# Patient Record
Sex: Male | Born: 1935 | Race: White | Hispanic: No | Marital: Married | Smoking: Never smoker
Health system: Southern US, Community
[De-identification: ages and names within clinical notes are randomized; demographics above are authoritative.]

## PROBLEM LIST (undated history)

## (undated) DIAGNOSIS — I82409 Acute embolism and thrombosis of unspecified deep veins of unspecified lower extremity: Secondary | ICD-10-CM

## (undated) DIAGNOSIS — C801 Malignant (primary) neoplasm, unspecified: Secondary | ICD-10-CM

## (undated) DIAGNOSIS — K469 Unspecified abdominal hernia without obstruction or gangrene: Secondary | ICD-10-CM

## (undated) DIAGNOSIS — I10 Essential (primary) hypertension: Secondary | ICD-10-CM

## (undated) DIAGNOSIS — E785 Hyperlipidemia, unspecified: Secondary | ICD-10-CM

## (undated) DIAGNOSIS — N400 Enlarged prostate without lower urinary tract symptoms: Secondary | ICD-10-CM

## (undated) DIAGNOSIS — I2510 Atherosclerotic heart disease of native coronary artery without angina pectoris: Secondary | ICD-10-CM

## (undated) HISTORY — DX: Essential (primary) hypertension: I10

## (undated) HISTORY — DX: Acute embolism and thrombosis of unspecified deep veins of unspecified lower extremity: I82.409

## (undated) HISTORY — DX: Benign prostatic hyperplasia without lower urinary tract symptoms: N40.0

## (undated) HISTORY — DX: Hyperlipidemia, unspecified: E78.5

## (undated) HISTORY — DX: Atherosclerotic heart disease of native coronary artery without angina pectoris: I25.10

## (undated) HISTORY — DX: Unspecified abdominal hernia without obstruction or gangrene: K46.9

## (undated) HISTORY — PX: BACK SURGERY: SHX140

---

## 2008-11-28 ENCOUNTER — Encounter: Payer: Self-pay | Admitting: Cardiology

## 2009-12-18 ENCOUNTER — Encounter: Payer: Self-pay | Admitting: Cardiology

## 2009-12-26 ENCOUNTER — Encounter: Payer: Self-pay | Admitting: Cardiology

## 2010-01-08 ENCOUNTER — Ambulatory Visit: Payer: Self-pay | Admitting: Cardiology

## 2010-01-08 ENCOUNTER — Encounter (INDEPENDENT_AMBULATORY_CARE_PROVIDER_SITE_OTHER): Payer: Self-pay | Admitting: *Deleted

## 2010-01-08 DIAGNOSIS — I10 Essential (primary) hypertension: Secondary | ICD-10-CM | POA: Insufficient documentation

## 2010-01-08 DIAGNOSIS — R9439 Abnormal result of other cardiovascular function study: Secondary | ICD-10-CM | POA: Insufficient documentation

## 2010-01-08 LAB — CONVERTED CEMR LAB
BUN: 18 mg/dL (ref 6–23)
Basophils Absolute: 0 10*3/uL (ref 0.0–0.1)
Basophils Relative: 0.4 % (ref 0.0–3.0)
CO2: 29 meq/L (ref 19–32)
Calcium: 9.4 mg/dL (ref 8.4–10.5)
Chloride: 100 meq/L (ref 96–112)
Creatinine, Ser: 0.8 mg/dL (ref 0.4–1.5)
Eosinophils Absolute: 0.1 10*3/uL (ref 0.0–0.7)
Eosinophils Relative: 1.7 % (ref 0.0–5.0)
GFR calc non Af Amer: 97.83 mL/min (ref >60)
Glucose, Bld: 84 mg/dL (ref 70–99)
HCT: 40.4 % (ref 39.0–52.0)
Hemoglobin: 13.6 g/dL (ref 13.0–17.0)
INR: 1 (ref 0.8–1.0)
Lymphocytes Relative: 26.1 % (ref 12.0–46.0)
Lymphs Abs: 2 10*3/uL (ref 0.7–4.0)
MCHC: 33.6 g/dL (ref 30.0–36.0)
MCV: 88.3 fL (ref 78.0–100.0)
Monocytes Absolute: 0.7 10*3/uL (ref 0.1–1.0)
Monocytes Relative: 9.6 % (ref 3.0–12.0)
Neutro Abs: 4.7 10*3/uL (ref 1.4–7.7)
Neutrophils Relative %: 62.2 % (ref 43.0–77.0)
Platelets: 224 10*3/uL (ref 150.0–400.0)
Potassium: 4 meq/L (ref 3.5–5.1)
Prothrombin Time: 10.7 s (ref 9.7–11.8)
RBC: 4.57 M/uL (ref 4.22–5.81)
RDW: 13.7 % (ref 11.5–14.6)
Sodium: 138 meq/L (ref 135–145)
WBC: 7.6 10*3/uL (ref 4.5–10.5)
aPTT: 24.8 s (ref 21.7–28.8)

## 2010-01-09 ENCOUNTER — Ambulatory Visit (HOSPITAL_COMMUNITY): Admission: RE | Admit: 2010-01-09 | Discharge: 2010-01-10 | Payer: Self-pay | Admitting: Cardiology

## 2010-01-09 ENCOUNTER — Ambulatory Visit
Admission: RE | Admit: 2010-01-09 | Discharge: 2010-01-09 | Payer: Self-pay | Source: Home / Self Care | Admitting: Cardiology

## 2010-01-09 ENCOUNTER — Ambulatory Visit: Payer: Self-pay | Admitting: Cardiology

## 2010-01-15 ENCOUNTER — Encounter: Payer: Self-pay | Admitting: Cardiology

## 2010-01-16 DIAGNOSIS — I2510 Atherosclerotic heart disease of native coronary artery without angina pectoris: Secondary | ICD-10-CM

## 2010-01-16 HISTORY — DX: Atherosclerotic heart disease of native coronary artery without angina pectoris: I25.10

## 2010-01-29 ENCOUNTER — Ambulatory Visit: Payer: Self-pay | Admitting: Cardiology

## 2010-01-29 DIAGNOSIS — IMO0001 Reserved for inherently not codable concepts without codable children: Secondary | ICD-10-CM | POA: Insufficient documentation

## 2010-01-29 DIAGNOSIS — I2510 Atherosclerotic heart disease of native coronary artery without angina pectoris: Secondary | ICD-10-CM | POA: Insufficient documentation

## 2010-01-31 LAB — CONVERTED CEMR LAB
CK-MB: 2.7 ng/mL (ref 0.3–4.0)
Total CK: 68 units/L (ref 7–232)

## 2010-02-19 ENCOUNTER — Ambulatory Visit: Payer: Self-pay | Admitting: Cardiology

## 2010-02-19 ENCOUNTER — Ambulatory Visit: Payer: Self-pay

## 2010-03-09 ENCOUNTER — Telehealth: Payer: Self-pay | Admitting: Cardiology

## 2010-03-14 ENCOUNTER — Telehealth (INDEPENDENT_AMBULATORY_CARE_PROVIDER_SITE_OTHER): Payer: Self-pay | Admitting: *Deleted

## 2010-03-29 ENCOUNTER — Telehealth: Payer: Self-pay | Admitting: Cardiology

## 2010-04-02 ENCOUNTER — Telehealth: Payer: Self-pay | Admitting: Cardiovascular Disease

## 2010-04-05 ENCOUNTER — Ambulatory Visit
Admission: RE | Admit: 2010-04-05 | Discharge: 2010-04-05 | Payer: Self-pay | Source: Home / Self Care | Attending: Cardiovascular Disease | Admitting: Cardiovascular Disease

## 2010-04-17 NOTE — Assessment & Plan Note (Signed)
Summary: rov/per dr Juanda Chance   Primary Provider:  Barron Alvine, MD [facility]   History of Present Illness: Harold [name] is 76 years old and came today for an unscheduled visit because of chest pain. I saw him recently in consultation for exertional chest pain. He underwent catheterization and was found to have a tight lesion in a large posterolateral branch of the right coronary artery and a tight lesion approximately LAD and complete occlusion of the distal LAD. We fixed the lesions in the posterior lateral branch of the right coronary and the mid LAD with drug-eluting stents.  Over the last few days he has had some sharp shooting chest pains. He had recurrence this morning and called and we asked him to come in. He also has been having problems with "gas". He also has had some muscle weakness and wondered if this was related to his simvastatin.  He has had none in the exertional chest tightness that he had prior to his PCI procedure.  Current Medications (verified): 1)  Isosorbide Dinitrate 5 Mg Tabs (Isosorbide Dinitrate) .... Take One Tablet Two Times A Day 2)  Lisinopril 10 Mg Tabs (Lisinopril) .... Take One Tablet By Mouth Daily 3)  Atenolol 25 Mg Tabs (Atenolol) .... Take One Tablet By Mouth Daily 4)  Hydrochlorothiazide 25 Mg Tabs (Hydrochlorothiazide) .... Take One Tablet By Mouth Daily. 5)  Finasteride 5 Mg Tabs (Finasteride) .... Please Take 1 Tablet By Mouth Daily. 6)  Vitamin D 1000 Unit Tabs (Cholecalciferol) .... Please Take One Tablet By Mouth Daily. 7)  Timoptic 0.5 % Soln (Timolol Maleate) .... One Drop Daily in The Right Eye. 8)  Brimonidine Tartrate 0.2 % Soln (Brimonidine Tartrate) .... One Drop in Right Eye Two Times A Day. 9)  Aspirin 81 Mg Tabs (Aspirin) .... Once Daily 10)  Effient 10 Mg Tabs (Prasugrel Hcl) .... Once Daily 11)  Simvastatin 40 Mg Tabs (Simvastatin) .... Once Daily  Allergies (verified): No Known Drug Allergies  Past History:  Past  Medical History: Reviewed history from 01/05/2010 and no changes required. hypertension chest pain BPH elevated PSA  Review of Systems       ROS is negative except as outlined in HPI.   Vital Signs:  Patient profile:   76 year old male Height:      70 inches Weight:      195 pounds BMI:     28.08 Pulse rate:   83 / minute Pulse rhythm:   regular BP sitting:   111 / 65  (left arm) Cuff size:   regular  Vitals Entered By: Judithe Modest CMA (January 29, 2010 12:23 PM)  Physical Exam  Additional Exam:  Gen. Well-nourished, in no distress   Neck: No JVD, thyroid not enlarged, no carotid bruits Lungs: No tachypnea, clear without rales, rhonchi or wheezes Cardiovascular: Rhythm regular, PMI not displaced,  heart sounds  normal, no murmurs or gallops, no peripheral edema, pulses normal in all 4 extremities. Abdomen: BS normal, abdomen soft and non-tender without masses or organomegaly, no hepatosplenomegaly. MS: No deformities, no cyanosis or clubbing   Neuro:  No focal sns   Skin:  no lesions    Impression & Recommendations:  Problem # 1:  CAD, NATIVE VESSEL (ICD-414.01) He had recent two-vessel PCI with drug stents for stable angina. His platelet inhibition was low so we switched him from Plavix to prasugrel.  He has had recent chest pain which sounds noncardiac. It possibly could be related to gas. We will  give him a trial of Phazyme for gas. We will have him return in 2 weeks for a stress ECG. His updated medication list for this problem includes:    Isosorbide Dinitrate 5 Mg Tabs (Isosorbide dinitrate) .[name]... Take one tablet two times a day    Lisinopril 10 Mg Tabs (Lisinopril) .[name]... Take one tablet by mouth daily    Atenolol 25 Mg Tabs (Atenolol) .[name]... Take one tablet by mouth daily    Aspirin 81 Mg Tabs (Aspirin) ..... Once daily    Effient 10 Mg Tabs (Prasugrel hcl) ..... Once daily  Problem # 2:  MUSCLE PAIN (ICD-729.1) He is having myalgias and weakness of his lower  extremities and wonders if this is related to simvastatin. We will hold the simvastatin. Orders: TLB-CK Total Only(Creatine Kinase/CPK) (82550-CK) TLB-CK-MB (Creatine Kinase MB) (82553-CKMB)  Problem # 3:  HYPERTENSION, BENIGN ESSENTIAL (ICD-401.1) This is well controlled on current medications. His updated medication list for this problem includes:    Lisinopril 10 Mg Tabs (Lisinopril) .[name]... Take one tablet by mouth daily    Atenolol 25 Mg Tabs (Atenolol) .[name]... Take one tablet by mouth daily    Hydrochlorothiazide 25 Mg Tabs (Hydrochlorothiazide) .[name]... Take one tablet by mouth daily.    Aspirin 81 Mg Tabs (Aspirin) ..... Once daily  Other Orders: Treadmill (Treadmill)  Patient Instructions: 1)  Your physician recommends that you schedule a follow-up appointment in: 2 WEEKS TREADMILL 2)  Your physician has recommended you make the following change in your medication: STOP SIMVASTATIN 3)  START PHAZYME OTC FOR GAS Prescriptions: ISOSORBIDE DINITRATE 5 MG TABS (ISOSORBIDE DINITRATE) take one tablet two times a day  #180 x 4   Entered by:   Deliah Goody, RN   Authorized by:   Lenoria Farrier, MD, [facility]   Signed by:   Deliah Goody, RN on 01/29/2010   Method used:   Electronically to        [facility]* (mail-order)       [address] [place]  [address]       Ph: [phone]       Fax: [phone]   RxID:   [id number]

## 2010-04-17 NOTE — Letter (Signed)
Summary: [facility]   Imported By: Marylou Mccoy 02/06/2010 15:14:53  _____________________________________________________________________  External Attachment:    Type:   Image     Comment:   External Document

## 2010-04-17 NOTE — Letter (Signed)
Summary: Cardiac Catheterization Instructions- Main Lab  [facility] [employer], Main Office  [address]   [place], [place] [address]   Phone: [phone]  Fax: [phone]     01/08/2010 MRN: [id number]  SUSANO CLECKLER [address] [place], [place]  [address]  Dear Mr. Fenn,   You are scheduled for Cardiac Catheterization on Tuesday 01/09/10              with Dr. Juanda Chance.  Please arrive at the [facility] at 5:30      a.m. on the day of your procedure.  1. DIET     __x__ Nothing to eat or drink after midnight except your medications with a sip of water.  2. Come to the [place] office on  (today)    for lab work.  The lab at [facility] is open from 8:30 a.m. to 1:30 p.m. and 2:30 p.m. to 5:00 p.m.  The lab at [facility] is open from 7:30 a.m. to 5:30 p.m.  You do not have to be fasting.  3. MAKE SURE YOU TAKE YOUR ASPIRIN.  4. _____ DO NOT TAKE these medications before your procedure:         ________________________________________________________________________________      _x___ YOU MAY TAKE ALL of your remaining medications with a small amount of water.      ____ START NEW medications:     ________________________________________________________________________________      ____ Eilene Ghazi instructions:     ________________________________________________________________________________  5. Plan for one night stay - bring personal belongings (i.e. toothpaste, toothbrush, etc.)  6. Bring a current list of your medications and current insurance cards.  7. Must have a responsible person to drive you home.   8. Someone must be with you for the first 24 hours after you arrive home.  9. Please wear clothes that are easy to get on and off and wear slip-on shoes.  *Special note: Every effort is made to have your procedure done on time.  Occasionally there are emergencies that present themselves at the  hospital that may cause delays.  Please be patient if a delay does occur.  If you have any questions after you get home, please call the office at the number listed above.  Sherri Rad, RN, BSN

## 2010-04-17 NOTE — Assessment & Plan Note (Signed)
Summary: NP6   Primary Provider:  Barron Alvine, MD [facility]  CC:  chest pain.  History of Present Illness: 76 year old male with PMH significant for HTN preseted to his PCP with c/o chest tightness, a stress test was done which showed some inferior/apical defect which was reversible with rest. today patient denies any CP or SOB, and said that the last time he has chest tighness was last saturday when he was moving some plants with his wife, the pain lasted for 5 minutes and subsided with rest. The pain is usually exertional and subsides with rest, patient has been having these symptoms for the past several months.    Patient is feeling well and denies CP, abdominal pain, nausea, vomiting, HA's, palpitations, blurred vision. fever, chills, diarrhea, constipation or SOB.   Current Medications (verified): 1)  Isosorbide Dinitrate 10 Mg Tabs (Isosorbide Dinitrate) .... Take 1/2 Tablet By Mouth Two Times A Day. 2)  Lisinopril 10 Mg Tabs (Lisinopril) .... Take One Tablet By Mouth Daily 3)  Atenolol 25 Mg Tabs (Atenolol) .... Take One Tablet By Mouth Daily 4)  Hydrochlorothiazide 25 Mg Tabs (Hydrochlorothiazide) .... Take One Tablet By Mouth Daily. 5)  Finasteride 5 Mg Tabs (Finasteride) .... Please Take 1 Tablet By Mouth Daily. 6)  Vitamin D 1000 Unit Tabs (Cholecalciferol) .... Please Take One Tablet By Mouth Daily. 7)  Timoptic 0.5 % Soln (Timolol Maleate) .... One Drop Daily in The Right Eye. 8)  Brimonidine Tartrate 0.2 % Soln (Brimonidine Tartrate) .... One Drop in Right Eye Two Times A Day.  Allergies (verified): No Known Drug Allergies  Past History:  Past Medical History: Last updated: 01/05/2010 hypertension chest pain BPH elevated PSA  Review of Systems       Negative except per HPI  Vital Signs:  Patient profile:   76 year old male Height:      70 inches Weight:      207.50 pounds Pulse rate:   67 / minute Resp:     14 per minute BP sitting:   134 /  58  Physical Exam  General:  alert, well-developed, and cooperative to examination.    Neck:  supple, full ROM, no thyromegaly, no JVD, and no carotid bruits.    Lungs:  normal respiratory effort, no accessory muscle use, normal breath sounds, no crackles, and no wheezes.  Heart:  normal rate, regular rhythm, no murmur, no gallop, and no rub.    Abdomen:  soft, non-tender, normal bowel sounds, no distention, no guarding, no rebound tenderness, no hepatomegaly, and no splenomegaly.    Extremities:  trace edema in lower limbs Neurologic:  non focal    Impression & Recommendations:  Problem # 1:  MYOCARDIAL PERFUSION SCAN, WITH STRESS TEST, ABNORMAL (ICD-794.39) Assessment New  study shows moderate-severe apical, inferoapical reversible with rest and consistent with ischemia, EKG was WNL.  Patients other risk factors include age, hypertension and male sex. given his abnoral stress test he is likely to have a significant lesion, will schedule for cath tomorrow for further eval.   Orders: EKG w/ Interpretation (93000) TLB-BMP (Basic Metabolic Panel-BMET) (80048-METABOL) TLB-CBC Platelet - w/Differential (85025-CBCD) TLB-PT (Protime) (85610-PTP) TLB-PTT (85730-PTTL) Cardiac Catheterization (Cardiac Cath)  Problem # 2:  HYPERTENSION, BENIGN ESSENTIAL (ICD-401.1) Assessment: Comment Only  Well controlled on current treatment, No new changes made today, Will continue to monitor.   His updated medication list for this problem includes:    Lisinopril 10 Mg Tabs (Lisinopril) .[name]... Take one tablet  by mouth daily    Atenolol 25 Mg Tabs (Atenolol) .[name]... Take one tablet by mouth daily    Hydrochlorothiazide 25 Mg Tabs (Hydrochlorothiazide) .[name]... Take one tablet by mouth daily.  BP today: 134/58  Orders: EKG w/ Interpretation (93000) TLB-BMP (Basic Metabolic Panel-BMET) (80048-METABOL) TLB-CBC Platelet - w/Differential (85025-CBCD) TLB-PT (Protime) (85610-PTP) TLB-PTT  (85730-PTTL) Cardiac Catheterization (Cardiac Cath)  Patient Instructions: 1)  Your physician has requested that you have a cardiac catheterization.  Cardiac catheterization is used to diagnose and/or treat various heart conditions. Doctors may recommend this procedure for a number of different reasons. The most common reason is to evaluate chest pain. Chest pain can be a symptom of coronary artery disease (CAD), and cardiac catheterization can show whether plaque is narrowing or blocking your heart's arteries. This procedure is also used to evaluate the valves, as well as measure the blood flow and oxygen levels in different parts of your heart.  For further information please visit [email or web address]  Please follow instruction sheet, as given. 2)  Labwork today: bmet/cbc/ptt/pt (402.10;794.30;413.9). 3)  Your physician recommends that you schedule a follow-up appointment in: 2 weeks.

## 2010-04-17 NOTE — Consult Note (Signed)
Summary: [facility] Cardiac Referral [facility] Cardiac Referral Form   Imported By: Roderic Ovens 02/05/2010 15:10:59  _____________________________________________________________________  External Attachment:    Type:   Image     Comment:   External Document

## 2010-04-17 NOTE — Cardiovascular Report (Signed)
Summary: Cath/Percutaneous Orders   Cath/Percutaneous Orders   Imported By: Roderic Ovens 01/22/2010 09:26:43  _____________________________________________________________________  External Attachment:    Type:   Image     Comment:   External Document

## 2010-04-19 NOTE — Progress Notes (Signed)
Summary: rx refill  Phone Note Refill Request Call back at Home Phone [phone] Message from:  Patient on March 29, 2010 9:14 AM  Refills Requested: Medication #1:  PRAVASTATIN SODIUM 40 MG TABS Take one tablet by mouth daily at bedtime. pt states we need to call because rx is on hold.    Method Requested: Telephone to Pharmacy Initial call taken by: Roe Coombs,  March 29, 2010 9:14 AM    Additional Follow-up for Phone Call Additional follow up Details #2::    I called pt she states that DR Juanda Chance stopped simvastation due to muscle pain and was given a written rx pravastation..Prescription soultions  refill on hold..I just wanted to be sure from [facility]. that it is ok to resend rx. I did not see where we had done one in the computer but I will verify with Herbert Seta RN pt has been taking wife pills(pravastation) Follow-up by: Burnett Kanaris, CNA,  March 29, 2010 12:04 PM  Prescriptions: PRAVASTATIN SODIUM 40 MG TABS (PRAVASTATIN SODIUM) Take one tablet by mouth daily at bedtime  #90 x 3   Entered by:   Burnett Kanaris, CNA   Authorized by:   Verne Carrow, MD   Signed by:   Burnett Kanaris, CNA on 03/29/2010   Method used:   Electronically to        [facility] (mail-order)       [address]   [address]       Ph: [phone]       Fax: [phone]   RxID:   [id number]

## 2010-04-19 NOTE — Assessment & Plan Note (Signed)
Summary: ec6/wpa   Visit Type:  Follow-up Primary Provider:  Barron Alvine, MD [facility]  CC:  no complaints.  History of Present Illness: Harold [name] is a 76 yo WM with history of CAD who has been followed in the past by Dr. Juanda Chance. He is here today to establish care with me. He underwent catheterization in October 2011 because of CP and was found to have a tight lesion in a large posterolateral branch of the right coronary artery and a tight lesion in the proximal  LAD and complete occlusion of the distal LAD.  Dr. Juanda Chance  fixed the lesions in the posterior lateral branch of the right coronary and the mid LAD with drug-eluting stents in October 2011. He has done well. No chest pain or SOB. He has ruptured a blood vessel in his left eye and it is red, no visual changes. He has some bruising over his hands. He has recently developed a hernia that is easily compressible. He is asking when it would be ok to have the elective hernia repair.   Problems Prior to Update: 1)  Cad, Native Vessel  (ICD-414.01) 2)  Muscle Pain  (ICD-729.1) 3)  Hypertension, Benign Essential  (ICD-401.1) 4)  Myocardial Perfusion Scan, With Stress Test, Abnormal  (ICD-794.39)  Current Medications (verified): 1)  Isosorbide Dinitrate 5 Mg Tabs (Isosorbide Dinitrate) .... Take One Tablet Two Times A Day 2)  Lisinopril 10 Mg Tabs (Lisinopril) .... Take One Tablet By Mouth Daily 3)  Atenolol 25 Mg Tabs (Atenolol) .... Take One Tablet By Mouth Daily 4)  Hydrochlorothiazide 25 Mg Tabs (Hydrochlorothiazide) .... Take One Tablet By Mouth Daily. 5)  Finasteride 5 Mg Tabs (Finasteride) .... Please Take 1 Tablet By Mouth Daily. 6)  Vitamin D 1000 Unit Tabs (Cholecalciferol) .... Please Take One Tablet By Mouth Daily. 7)  Timoptic 0.5 % Soln (Timolol Maleate) .... One Drop Daily in The Right Eye. 8)  Brimonidine Tartrate 0.2 % Soln (Brimonidine Tartrate) .... One Drop in Right Eye Two Times A Day. 9)  Aspirin 81 Mg Tabs  (Aspirin) .... Once Daily 10)  Effient 10 Mg Tabs (Prasugrel Hcl) .... Once Daily 11)  Pravastatin Sodium 40 Mg Tabs (Pravastatin Sodium) .... Take One Tablet By Mouth Daily At Bedtime  Allergies (verified): No Known Drug Allergies  Past History:  Past Medical History: HTN CAD-s/p drug eluting stents LAD and PL branch of RCA 11/11 Dr. Juanda Chance BPH Hernia  Social History: Reviewed history from 01/05/2010 and no changes required. Tobacco Use - No.  No alcohol No illicit drug use Married  Review of Systems  The patient denies fatigue, malaise, fever, weight gain/loss, vision loss, decreased hearing, hoarseness, chest pain, palpitations, shortness of breath, prolonged cough, wheezing, sleep apnea, coughing up blood, abdominal pain, blood in stool, nausea, vomiting, diarrhea, heartburn, incontinence, blood in urine, muscle weakness, joint pain, leg swelling, rash, skin lesions, headache, fainting, dizziness, depression, anxiety, enlarged lymph nodes, easy bruising or bleeding, and environmental allergies.    Vital Signs:  Patient profile:   76 year old male Height:      70 inches Weight:      174.50 pounds BMI:     25.13 Pulse rate:   68 / minute BP sitting:   122 / 60  (left arm) Cuff size:   regular  Vitals Entered By: Micki Riley CNA (April 05, 2010 3:34 PM)  Physical Exam  General:  General: Well developed, well nourished, NAD Musculoskeletal: Muscle strength 5/5 all ext Psychiatric:  Mood and affect normal Neck: No JVD, no carotid bruits, no thyromegaly, no lymphadenopathy. Lungs:Clear bilaterally, no wheezes, rhonci, crackles CV: RRR no murmurs, gallops rubs Abdomen: soft, NT, ND, BS present Extremities: No edema, pulses 2+.    Impression & Recommendations:  Problem # 1:  CAD, NATIVE VESSEL (ICD-414.01) Stable. He will need dual antiplatelet therapy with ASA and Effient for at least one year. His drug eluting stents were placed in October 2011. Premature  discontinuation of the antiplatelet therapy would put the patient at risk for stent thrombosis and MI. I have discussed this with the pt and his wife. I would not proceed with the elective hernia surgery at this time. No medication changes.   His updated medication list for this problem includes:    Isosorbide Dinitrate 5 Mg Tabs (Isosorbide dinitrate) .[name]... Take one tablet two times a day    Lisinopril 10 Mg Tabs (Lisinopril) .[name]... Take one tablet by mouth daily    Atenolol 25 Mg Tabs (Atenolol) .[name]... Take one tablet by mouth daily    Aspirin 81 Mg Tabs (Aspirin) ..... Once daily    Effient 10 Mg Tabs (Prasugrel hcl) ..... Once daily  Problem # 2:  HYPERTENSION, BENIGN ESSENTIAL (ICD-401.1) BP well controlled.  No changes in therapy.   His updated medication list for this problem includes:    Lisinopril 10 Mg Tabs (Lisinopril) .[name]... Take one tablet by mouth daily    Atenolol 25 Mg Tabs (Atenolol) .[name]... Take one tablet by mouth daily    Hydrochlorothiazide 25 Mg Tabs (Hydrochlorothiazide) .[name]... Take one tablet by mouth daily.    Aspirin 81 Mg Tabs (Aspirin) ..... Once daily  Patient Instructions: 1)  Your physician recommends that you schedule a follow-up appointment in: 6 months

## 2010-04-19 NOTE — Progress Notes (Signed)
  ROI Mailed to Pt today. Harold [name]  March 14, 2010 3:19 PM     Appended Document:  Received ROI back,Faxed Records over to [facility] @ [phone]

## 2010-04-19 NOTE — Progress Notes (Signed)
Summary: question on meds re surgery  Phone Note Call from Patient Call back at Home Phone [phone]   Caller: Patient Reason for Call: Talk to Nurse Summary of Call: pt has question on meds re surgery. Initial call taken by: Roe Coombs,  March 09, 2010 4:17 PM  Follow-up for Phone Call        Pt is planning on having hernia surgery in February. Pt is requesting surgical clearance .  His surgeon will be Dr. Katy Fitch in [facility]. I will forward to Dr. Juanda Chance and Dr. Clifton Haseeb. Mylo Red RN Follow-up by: Lisabeth Devoid RN,  March 09, 2010 4:30 PM     Appended Document: question on meds re surgery Is she on my schedule for a visit? cdm  Appended Document: question on meds re surgery No. Per Dr. Juanda Chance, she is to f/u with you in 4 months so around April. Want her to come in for an OV for clearance?   Appended Document: question on meds re surgery yes, i need to see him if he needs me to give him cardiac clearance. thanks, cdm  Appended Document: question on meds re surgery Patient is meeting with a new surgeon next Friday for his hernia surgery and believes they may want to wait a few months so he will call us after this appointment in regards to making an appointment with [facility] for cardiac clearance.

## 2010-04-19 NOTE — Progress Notes (Signed)
Summary: need clearance surgery hernia pt last seen in December  Phone Note Call from Patient Call back at [facility] Phone [phone]   Caller: Patient Summary of Call: Pt need surgical clearance for surgery   Initial call taken by: Judie Grieve,  April 02, 2010 10:15 AM  Follow-up for Phone Call        attemted to call pt. phone sound bussy. Ollen Gross, RN, BSN  April 02, 2010 10:48 AM  Pt's wife called regarding surgical clearance. Pt. had a stress test on 02/19/10. Pt. needs a hernia repair. He changed surgeons since the last Surgical clearance request. Pt. was seen by Dr. Glenna Fellows from [facility] last friday. Surgeon  adviced pt. to call this office for surgical clearance. I let pt's wife know that I will send this message to Dr. Clifton Braven and his  nurse's desktop. Wife verbalized understanding.  Follow-up by: Ollen Gross, RN, BSN,  April 02, 2010 12:39 PM  Additional Follow-up for Phone Call Additional follow up Details #1::        LVMTCB  Patient will need to come into the office for an OV with Dr. Clifton Dacen before we can clear him for surgery. Whitney Maeola Sarah RN  April 03, 2010 10:25 AM  patient will see Dr. Clifton Adiel 04/05/10 @ 3:30pm Whitney Maeola Sarah RN  April 03, 2010 11:25 AM

## 2010-05-30 LAB — CARDIAC PANEL(CRET KIN+CKTOT+MB+TROPI)
CK, MB: 5.3 ng/mL — ABNORMAL HIGH (ref 0.3–4.0)
CK, MB: 5.8 ng/mL — ABNORMAL HIGH (ref 0.3–4.0)
Relative Index: 2.6 — ABNORMAL HIGH (ref 0.0–2.5)
Relative Index: 2.7 — ABNORMAL HIGH (ref 0.0–2.5)
Total CK: 203 U/L (ref 7–232)
Total CK: 216 U/L (ref 7–232)
Troponin I: 0.01 ng/mL (ref 0.00–0.06)
Troponin I: <0.01 ng/mL (ref 0.00–0.06)

## 2010-05-30 LAB — BASIC METABOLIC PANEL
BUN: 12 mg/dL (ref 6–23)
CO2: 28 mEq/L (ref 19–32)
Calcium: 8.9 mg/dL (ref 8.4–10.5)
Chloride: 104 mEq/L (ref 96–112)
Creatinine, Ser: 0.89 mg/dL (ref 0.4–1.5)
GFR calc Af Amer: >60 mL/min (ref >60)
GFR calc non Af Amer: >60 mL/min (ref >60)
Glucose, Bld: 91 mg/dL (ref 70–99)
Potassium: 3.4 mEq/L — ABNORMAL LOW (ref 3.5–5.1)
Sodium: 139 mEq/L (ref 135–145)

## 2010-05-30 LAB — CBC
HCT: 39.4 % (ref 39.0–52.0)
Hemoglobin: 13 g/dL (ref 13.0–17.0)
MCH: 28.3 pg (ref 26.0–34.0)
MCHC: 33 g/dL (ref 30.0–36.0)
MCV: 85.8 fL (ref 78.0–100.0)
Platelets: 186 10*3/uL (ref 150–400)
RBC: 4.59 MIL/uL (ref 4.22–5.81)
RDW: 13 % (ref 11.5–15.5)
WBC: 7.2 10*3/uL (ref 4.0–10.5)

## 2010-05-30 LAB — PLATELET INHIBITION P2Y12
P2Y12 % Inhibition: 13 %
Platelet Function  P2Y12: 271 [PRU] (ref 194–418)
Platelet Function Baseline: 310 [PRU] (ref 194–418)

## 2010-08-17 ENCOUNTER — Encounter: Payer: Self-pay | Admitting: Cardiovascular Disease

## 2010-08-22 ENCOUNTER — Encounter: Payer: Self-pay | Admitting: Cardiovascular Disease

## 2010-08-29 ENCOUNTER — Other Ambulatory Visit: Payer: Self-pay | Admitting: *Deleted

## 2010-08-29 MED ORDER — PRASUGREL HCL 10 MG PO TABS: 10.0000 mg | ORAL_TABLET | Freq: Every day | ORAL | Status: DC

## 2010-09-05 ENCOUNTER — Ambulatory Visit (INDEPENDENT_AMBULATORY_CARE_PROVIDER_SITE_OTHER): Payer: Medicare Other | Admitting: Cardiovascular Disease

## 2010-09-05 ENCOUNTER — Encounter: Payer: Self-pay | Admitting: Cardiovascular Disease

## 2010-09-05 VITALS — BP 124/68 | HR 62 | Ht 70.0 in | Wt 154.4 lb

## 2010-09-05 DIAGNOSIS — I2510 Atherosclerotic heart disease of native coronary artery without angina pectoris: Secondary | ICD-10-CM

## 2010-09-05 NOTE — Assessment & Plan Note (Addendum)
Stable. No changes in therapy. Chest pain is most likely non-cardiac. He will come back in 3 months and we will discuss stopping his Effient for hernia repair.

## 2010-09-05 NOTE — Progress Notes (Signed)
History of Present Illness:[name] is a 76 yo WM with history of CAD who has been followed in the past by Dr. Juanda Chance. He is here today for cardiac follow up. I saw him for the first time in January 2012. He underwent catheterization in October 2011 because of CP and was found to have a tight lesion in a large posterolateral branch of the right coronary artery and a tight lesion in the proximal  LAD and complete occlusion of the distal LAD.  Dr. Juanda Chance  fixed the lesions in the posterior lateral branch of the right coronary and the mid LAD with drug-eluting stents in October 2011.   He has done well. He describes pain in his left shoulder blade that happens 2-3 times per day. Sharp in nature. No radiation of pain. No associated SOB, diaphoresis, nausea. Seems to worsen with movement. He has an overall lack of energy.     Past Medical History  Diagnosis Date  . HTN (hypertension)   . CAD (coronary artery disease) 01/2010    S/P drug eluting stents LAD and PL branch of RCA Dr. Juanda Chance  . BPH (benign prostatic hypertrophy)   . Hernia     No past surgical history on file.  Current Outpatient Prescriptions  Medication Sig Dispense Refill  . aspirin 81 MG tablet Take 81 mg by mouth daily.        [name] atenolol (TENORMIN) 25 MG tablet Take 25 mg by mouth daily.        . brimonidine (ALPHAGAN) 0.2 % ophthalmic solution Place 1 drop into the right eye 2 (two) times daily.        . cholecalciferol (VITAMIN D) 1000 UNITS tablet Take 1,000 Units by mouth daily.        . finasteride (PROSCAR) 5 MG tablet Take 5 mg by mouth daily.        . hydrochlorothiazide 25 MG tablet Take 25 mg by mouth daily.        . isosorbide dinitrate (ISORDIL) 5 MG tablet Take 5 mg by mouth 2 (two) times daily.        [name] lisinopril (PRINIVIL,ZESTRIL) 10 MG tablet Take 10 mg by mouth daily.        . prasugrel (EFFIENT) 10 MG TABS Take 1 tablet (10 mg total) by mouth daily.  30 tablet  3  . pravastatin (PRAVACHOL) 40 MG tablet  Take 40 mg by mouth at bedtime.        . timolol (TIMOPTIC) 0.5 % ophthalmic solution Place 1 drop into the right eye daily.          Allergies not on file  History   Social History  . Marital Status: Married    Spouse Name: N/A    Number of Children: N/A  . Years of Education: N/A   Occupational History  . Not on file.   Social History Main Topics  . Smoking status: Unknown If Ever Smoked  . Smokeless tobacco: Not on file  . Alcohol Use: No  . Drug Use: No  . Sexually Active: Not on file   Other Topics Concern  . Not on file   Social History Narrative   Married    No family history on file.  Review of Systems:  As stated in the HPI and otherwise negative.   BP 124/68  Pulse 62  Ht 5\' 10"  (1.778 m)  Wt 154 lb 6.4 oz (70.035 kg)  BMI 22.15 kg/m2  Physical Examination: General: Well developed,  well nourished, NAD HEENT: OP clear, mucus membranes moist SKIN: warm, dry. No rashes. Neuro: No focal deficits Musculoskeletal: Muscle strength 5/5 all ext Psychiatric: Mood and affect normal Neck: No JVD, no carotid bruits, no thyromegaly, no lymphadenopathy. Lungs:Clear bilaterally, no wheezes, rhonci, crackles Cardiovascular: Regular rate and rhythm. No murmurs, gallops or rubs. Abdomen:Soft. Bowel sounds present. Non-tender.  Extremities: No lower extremity edema. Pulses are 2 + in the bilateral DP/PT.  EKG:NSR, rate 62 bpm. Normal EKG.

## 2010-10-02 ENCOUNTER — Ambulatory Visit: Payer: Self-pay | Admitting: Cardiovascular Disease

## 2010-11-26 ENCOUNTER — Ambulatory Visit: Payer: Medicare Other | Admitting: Cardiovascular Disease

## 2010-11-27 ENCOUNTER — Ambulatory Visit (INDEPENDENT_AMBULATORY_CARE_PROVIDER_SITE_OTHER): Payer: Medicare Other | Admitting: Cardiovascular Disease

## 2010-11-27 ENCOUNTER — Telehealth (INDEPENDENT_AMBULATORY_CARE_PROVIDER_SITE_OTHER): Payer: Self-pay | Admitting: General Surgery

## 2010-11-27 ENCOUNTER — Encounter: Payer: Self-pay | Admitting: Cardiovascular Disease

## 2010-11-27 VITALS — BP 112/54 | HR 68 | Ht 70.0 in | Wt 154.4 lb

## 2010-11-27 DIAGNOSIS — I2510 Atherosclerotic heart disease of native coronary artery without angina pectoris: Secondary | ICD-10-CM

## 2010-11-27 MED ORDER — NITROGLYCERIN 0.4 MG SL SUBL: 0.4000 mg | SUBLINGUAL_TABLET | SUBLINGUAL | Status: DC | PRN

## 2010-11-27 NOTE — Patient Instructions (Signed)
Your physician wants you to follow-up in: 4 months. You will receive a reminder letter in the mail two months in advance. If you don't receive a letter, please call our office to schedule the follow-up appointment.  

## 2010-11-27 NOTE — Assessment & Plan Note (Signed)
Stable. No chest pain or SOB. He can hold his Effient after October 15th and proceed to hernia surgery. He does not need further cardiac workup before his planned surgical procedure. He will need to hold Effient 7 days before his surgery. Continue other cardiac meds.

## 2010-11-27 NOTE — Progress Notes (Signed)
History of Present Illness:[name] is a 76 yo WM with history of CAD who has been followed in the past by Dr. Juanda Chance. He is here today for cardiac follow up. I saw him for the first time in January 2012 and last saw him in the office in June 2012. He underwent catheterization in October 2011 because of CP and was found to have a tight lesion in a large posterolateral branch of the right coronary artery and a tight lesion in the proximal LAD and complete occlusion of the distal LAD. Dr. Juanda Chance fixed the lesions in the posterior lateral branch of the right coronary and the mid LAD with drug-eluting stents in October 2011.   He has done well. At the last visit in my office, he described pain in his left shoulder blade occurring  2-3 times per day. Sharp in nature. No radiation of pain. No associated SOB, diaphoresis, nausea. Seemed to worsen with movement. He also described overall fatigue. This seemed to be musculoskeletal. I did not pursue ischemic testing.  He has plans for a right inguinal hernia repair and will need to be off of Effient. He is here today to discuss stopping his Effient. He has had no recent chest pain or SOB. He is feeling well. Main complaint is his hernia.    Past Medical History  Diagnosis Date  . HTN (hypertension)   . CAD (coronary artery disease) 01/2010    S/P drug eluting stents LAD and PL branch of RCA Dr. Juanda Chance  . BPH (benign prostatic hypertrophy)   . Hernia     No past surgical history on file.  Current Outpatient Prescriptions  Medication Sig Dispense Refill  . aspirin 81 MG tablet Take 81 mg by mouth daily.        [name] atenolol (TENORMIN) 25 MG tablet Take 25 mg by mouth daily.        . brimonidine (ALPHAGAN) 0.2 % ophthalmic solution Place 1 drop into the right eye 2 (two) times daily.        . cholecalciferol (VITAMIN D) 1000 UNITS tablet Take 1,000 Units by mouth daily.        . finasteride (PROSCAR) 5 MG tablet Take 5 mg by mouth daily.        .  hydrochlorothiazide 25 MG tablet Take 25 mg by mouth daily.        . isosorbide dinitrate (ISORDIL) 5 MG tablet Take 5 mg by mouth 2 (two) times daily.        [name] lisinopril (PRINIVIL,ZESTRIL) 10 MG tablet Take 10 mg by mouth daily.        . prasugrel (EFFIENT) 10 MG TABS Take 1 tablet (10 mg total) by mouth daily.  30 tablet  3  . pravastatin (PRAVACHOL) 40 MG tablet Take 40 mg by mouth at bedtime.        . timolol (TIMOPTIC) 0.5 % ophthalmic solution Place 1 drop into the right eye daily.        . nitroGLYCERIN (NITROSTAT) 0.4 MG SL tablet Place 1 tablet (0.4 mg total) under the tongue every 5 (five) minutes as needed for chest pain.  25 tablet  6    No Known Allergies  History   Social History  . Marital Status: Married    Spouse Name: N/A    Number of Children: N/A  . Years of Education: N/A   Occupational History  . Not on file.   Social History Main Topics  . Smoking status: Never  Smoker   . Smokeless tobacco: Never Used  . Alcohol Use: No  . Drug Use: No  . Sexually Active: Not on file   Other Topics Concern  . Not on file   Social History Narrative   Married    No family history on file.  Review of Systems:  As stated in the HPI and otherwise negative.   BP 112/54  Pulse 68  Ht 5\' 10"  (1.778 m)  Wt 154 lb 6.4 oz (70.035 kg)  BMI 22.15 kg/m2  Physical Examination: General: Well developed, well nourished, NAD HEENT: OP clear, mucus membranes moist SKIN: warm, dry. No rashes. Neuro: No focal deficits Musculoskeletal: Muscle strength 5/5 all ext Psychiatric: Mood and affect normal Neck: No JVD, no carotid bruits, no thyromegaly, no lymphadenopathy. Lungs:Clear bilaterally, no wheezes, rhonci, crackles Cardiovascular: Regular rate and rhythm. No murmurs, gallops or rubs. Abdomen:Soft. Bowel sounds present. Non-tender.  Extremities: No lower extremity edema. Pulses are 2 + in the bilateral DP/PT.

## 2010-11-28 ENCOUNTER — Other Ambulatory Visit: Payer: Self-pay | Admitting: *Deleted

## 2010-11-28 MED ORDER — PRASUGREL HCL 10 MG PO TABS: 10.0000 mg | ORAL_TABLET | Freq: Every day | ORAL | Status: DC

## 2010-12-27 ENCOUNTER — Ambulatory Visit (INDEPENDENT_AMBULATORY_CARE_PROVIDER_SITE_OTHER): Payer: Medicare Other | Admitting: General Surgery

## 2010-12-27 ENCOUNTER — Encounter (INDEPENDENT_AMBULATORY_CARE_PROVIDER_SITE_OTHER): Payer: Self-pay | Admitting: General Surgery

## 2010-12-27 VITALS — BP 110/60 | HR 68 | Temp 97.4°F | Resp 14 | Ht 70.0 in | Wt 155.4 lb

## 2010-12-27 DIAGNOSIS — K4090 Unilateral inguinal hernia, without obstruction or gangrene, not specified as recurrent: Secondary | ICD-10-CM

## 2010-12-27 NOTE — Progress Notes (Signed)
Subjective:   Right inguinal hernia  Patient ID: Harold [name], male   DOB: 16-Dec-1934, 76 y.o.   MRN: [id number]  HPI Patient returns for planning for repair of his right inguinal hernia. He has been on antiplatelet agents for cardiac stent which has delayed his surgery but now he has clearance to stop these medications. His right groin hernia has enlarged somewhat and gives him intermittent discomfort and mild pain.  Past Medical History  Diagnosis Date  . HTN (hypertension)   . CAD (coronary artery disease) 01/2010    S/P drug eluting stents LAD and PL branch of RCA Dr. Juanda Chance  . BPH (benign prostatic hypertrophy)   . Hernia    Past Surgical History  Procedure Date  . Heart stents    Current Outpatient Prescriptions  Medication Sig Dispense Refill  . aspirin 81 MG tablet Take 81 mg by mouth daily.        [name] atenolol (TENORMIN) 25 MG tablet Take 25 mg by mouth daily.        . brimonidine (ALPHAGAN) 0.2 % ophthalmic solution Place 1 drop into the right eye 2 (two) times daily.        . cholecalciferol (VITAMIN D) 1000 UNITS tablet Take 1,000 Units by mouth daily.        . finasteride (PROSCAR) 5 MG tablet Take 5 mg by mouth daily.        . hydrochlorothiazide 25 MG tablet Take 25 mg by mouth daily.        . isosorbide dinitrate (ISORDIL) 5 MG tablet Take 5 mg by mouth 2 (two) times daily.        [name] lisinopril (PRINIVIL,ZESTRIL) 10 MG tablet Take 10 mg by mouth daily.        . nitroGLYCERIN (NITROSTAT) 0.4 MG SL tablet Place 1 tablet (0.4 mg total) under the tongue every 5 (five) minutes as needed for chest pain.  25 tablet  6  . prasugrel (EFFIENT) 10 MG TABS Take 1 tablet (10 mg total) by mouth daily.  30 tablet  11  . pravastatin (PRAVACHOL) 40 MG tablet Take 40 mg by mouth at bedtime.        . timolol (TIMOPTIC) 0.5 % ophthalmic solution Place 1 drop into the right eye daily.         No Known Allergies History  Substance Use Topics  . Smoking status: Never Smoker   . Smokeless  tobacco: Never Used  . Alcohol Use: No    Review of Systems  Respiratory: Negative.   Cardiovascular: Negative.   Gastrointestinal: Positive for constipation.       Objective:   Physical Exam General: Well-developed elderly male in no distress Skin: Warm and dry without rash or infection Lungs: Clear without wheezing or increased work of breathing Cardiovascular: Regular rate and rhythm without murmur. No edema. Abdomen: Soft and nontender. There is a good size right inguinal hernia extending down into the scrotum. No hernia on the left. Extremities: No joint swelling deformity Neurologic: Alert and fully oriented. Gait normal.    Assessment:     Enlarging symptomatic right inguinal hernia. He is now able to stop his antiplatelet agents. We will plan open repair under general anesthesia as an outpatient. We discussed the nature of the surgery and risks of bleeding, infection, anesthetic complications, recurrence, and chronic pain. There is a high likelihood of relieving his symptoms.    Plan:     Open repair of right inguinal hernia under general anesthesia  as an outpatient

## 2010-12-27 NOTE — Patient Instructions (Signed)
Call for any questions prior to your surgery 

## 2011-01-01 ENCOUNTER — Encounter (HOSPITAL_COMMUNITY)
Admission: RE | Admit: 2011-01-01 | Discharge: 2011-01-01 | Disposition: A | Discharge status: Home / Self Care | Payer: Medicare Other | Source: Ambulatory Visit | Attending: General Surgery | Admitting: General Surgery

## 2011-01-01 ENCOUNTER — Other Ambulatory Visit (INDEPENDENT_AMBULATORY_CARE_PROVIDER_SITE_OTHER): Payer: Self-pay | Admitting: General Surgery

## 2011-01-01 DIAGNOSIS — K469 Unspecified abdominal hernia without obstruction or gangrene: Secondary | ICD-10-CM

## 2011-01-01 LAB — BASIC METABOLIC PANEL
BUN: 16 mg/dL (ref 6–23)
CO2: 32 mEq/L (ref 19–32)
Calcium: 9.5 mg/dL (ref 8.4–10.5)
Chloride: 100 mEq/L (ref 96–112)
Creatinine, Ser: 0.68 mg/dL (ref 0.50–1.35)
GFR calc Af Amer: >90 mL/min (ref >90)
GFR calc non Af Amer: >90 mL/min (ref >90)
Glucose, Bld: 109 mg/dL — ABNORMAL HIGH (ref 70–99)
Potassium: 4.1 mEq/L (ref 3.5–5.1)
Sodium: 139 mEq/L (ref 135–145)

## 2011-01-01 LAB — CBC
HCT: 37.9 % — ABNORMAL LOW (ref 39.0–52.0)
Hemoglobin: 12.7 g/dL — ABNORMAL LOW (ref 13.0–17.0)
MCH: 30 pg (ref 26.0–34.0)
MCHC: 33.5 g/dL (ref 30.0–36.0)
MCV: 89.6 fL (ref 78.0–100.0)
Platelets: 160 10*3/uL (ref 150–400)
RBC: 4.23 MIL/uL (ref 4.22–5.81)
RDW: 12.7 % (ref 11.5–15.5)
WBC: 8.7 10*3/uL (ref 4.0–10.5)

## 2011-01-01 LAB — SURGICAL PCR SCREEN
MRSA, PCR: NEGATIVE
Staphylococcus aureus: NEGATIVE

## 2011-01-08 ENCOUNTER — Observation Stay (HOSPITAL_COMMUNITY): Admission: RE | Admit: 2011-01-08 | Payer: Self-pay | Source: Ambulatory Visit | Admitting: Surgery

## 2011-01-08 ENCOUNTER — Ambulatory Visit (HOSPITAL_COMMUNITY)
Admission: RE | Admit: 2011-01-08 | Discharge: 2011-01-08 | Disposition: A | Discharge status: Home / Self Care | Payer: Medicare Other | Source: Ambulatory Visit | Attending: General Surgery | Admitting: General Surgery

## 2011-01-08 DIAGNOSIS — Z01812 Encounter for preprocedural laboratory examination: Secondary | ICD-10-CM | POA: Insufficient documentation

## 2011-01-08 DIAGNOSIS — I10 Essential (primary) hypertension: Secondary | ICD-10-CM | POA: Insufficient documentation

## 2011-01-08 DIAGNOSIS — Z01818 Encounter for other preprocedural examination: Secondary | ICD-10-CM | POA: Insufficient documentation

## 2011-01-08 DIAGNOSIS — I2510 Atherosclerotic heart disease of native coronary artery without angina pectoris: Secondary | ICD-10-CM | POA: Insufficient documentation

## 2011-01-08 DIAGNOSIS — K4090 Unilateral inguinal hernia, without obstruction or gangrene, not specified as recurrent: Secondary | ICD-10-CM | POA: Insufficient documentation

## 2011-01-08 HISTORY — PX: HERNIA REPAIR: SHX51

## 2011-01-09 NOTE — Op Note (Signed)
NAMEAGOSTINO, GORIN NO.:  [id number]  MEDICAL RECORD NO.:  [id number]  LOCATION:  [facility]                         FACILITY:  [facility]  PHYSICIAN:  Sharlet Salina T. Satia Winger, M.D.DATE OF BIRTH:  16-Jan-1935  DATE OF PROCEDURE:  01/08/2011 DATE OF DISCHARGE:                              OPERATIVE REPORT   PREOPERATIVE DIAGNOSIS:  Right inguinal hernia.  POSTOPERATIVE DIAGNOSIS:  Right inguinal hernia.  SURGICAL PROCEDURE:  Open repair right inguinal hernia.  SURGEON:  Lorne Skeens. Rae Plotner, MD  ANESTHESIA:  General.  BRIEF HISTORY:  Mr. Bartel is a 76 year old male who presents with a gradually enlarging and symptomatic right inguinal hernia.  Exam confirms a good-sized right inguinal hernia extending down into the scrotum that is tender and reducible.  We have discussed options and I have elected to proceed with open repair with mesh as an outpatient. The nature of the procedure, its indications, risks of anesthetic complications, bleeding, infection, recurrence, and chronic pain syndromes were discussed and understood.  He is now brought to operating room for this procedure.  DESCRIPTION OF OPERATION:  The patient was brought to the operating room, placed in a supine position on the operating table, and general anesthesia was induced.  Laryngeal mask was used.  The patient had undergone a TAP block by Anesthesia preoperatively.  The right groin and entire lower abdomen were widely sterilely prepped and draped.  He received preoperative IV antibiotics.  PAS were in place.  Correct patient and procedure were verified.  An oblique incision was made in the right groin and dissection carried down through the subcutaneous tissue and Scarpa's fascia using cautery.  The external oblique was identified and cleared down to the external ring and inguinal ligament. The external oblique was divided along the lines of its fibers through the external ring and retracted.  The  ilioinguinal nerve was identified, but the cord protected.  The cord was dissected up off the floor at the pubic tubercle and cremasteric fibers were divided mobilizing the cord back to the internal ring.  Dissection within the cord revealed a large indirect sac that with blunt and cautery dissection was completely dissected away from cord structures up to the level of the internal ring.  At this point, the sac was suture ligated with 2-0 silk and divided and the stump retracted above the internal ring.  The floor was intact.  A piece of Parietex mesh was chosen and trimmed to size to fit the floor with tails around the cord of the internal ring.  The mesh was sutured initially to the pubic tubercle and then working medial to lateral along the inguinal ligament with running 2-0 Prolene until it was well lateral to the internal ring.  Medially, the mesh was sutured to the edge of the rectus sheath with interrupted 2-0 Prolene.  The tails were then tacked together lateral to the internal ring creating a new internal ring snug to a fingertip.  This provided nice broad coverage to the direct and indirect spaces.  20 mL of quarter-strength Marcaine with epinephrine was used to infiltrate soft tissues and subcutaneous tissue.  The wound was inspected for hemostasis, which was complete.  The  cord was returned to its anatomic position.  The external oblique closed over this with running 3-0 Vicryl.  Scarpa's fascia was closed with running 3- 0 Vicryl.  Skin was closed with subcuticular 4-0 Monocryl and Dermabond. Sponge and needle counts were correct.  The patient taken to recovery in good condition.     Lorne Skeens. Niani Mourer, M.D.     Tory Emerald  D:  01/08/2011  T:  01/08/2011  Job:  [id number]  Electronically Signed by Glenna Fellows M.D. on 01/09/2011 10:54:38 AM

## 2011-01-24 ENCOUNTER — Encounter (INDEPENDENT_AMBULATORY_CARE_PROVIDER_SITE_OTHER): Payer: Medicare Other | Admitting: General Surgery

## 2011-01-31 ENCOUNTER — Encounter: Payer: Self-pay | Admitting: Cardiovascular Disease

## 2011-02-01 ENCOUNTER — Encounter (INDEPENDENT_AMBULATORY_CARE_PROVIDER_SITE_OTHER): Payer: Self-pay | Admitting: General Surgery

## 2011-02-01 ENCOUNTER — Ambulatory Visit (INDEPENDENT_AMBULATORY_CARE_PROVIDER_SITE_OTHER): Payer: Medicare Other | Admitting: General Surgery

## 2011-02-01 VITALS — BP 106/60 | HR 64 | Temp 97.6°F | Resp 18 | Ht 70.0 in | Wt 158.5 lb

## 2011-02-01 DIAGNOSIS — Z09 Encounter for follow-up examination after completed treatment for conditions other than malignant neoplasm: Secondary | ICD-10-CM

## 2011-02-01 NOTE — Progress Notes (Signed)
Patient returns 3 weeks following open repair of a large right inguinal hernia. He had some nausea on the first night and some expected pain for a couple of days but generally has been doing well and is now up and around with minimal discomfort and happy with how he feels.  On examination the wound is well-healed without infection or other problem. The repair feels solid.  Assessment plan: Doing very well following open repair of his right inguinal hernia. We discussed activity limitations over the next several weeks. He will return here as needed.

## 2011-04-04 ENCOUNTER — Ambulatory Visit (INDEPENDENT_AMBULATORY_CARE_PROVIDER_SITE_OTHER): Payer: Medicare Other | Admitting: Cardiovascular Disease

## 2011-04-04 ENCOUNTER — Encounter: Payer: Self-pay | Admitting: Cardiovascular Disease

## 2011-04-04 VITALS — BP 118/54 | HR 59 | Ht 70.0 in | Wt 162.0 lb

## 2011-04-04 DIAGNOSIS — I2510 Atherosclerotic heart disease of native coronary artery without angina pectoris: Secondary | ICD-10-CM

## 2011-04-04 MED ORDER — PRAVASTATIN SODIUM 40 MG PO TABS: 40.0000 mg | ORAL_TABLET | Freq: Every day | ORAL | Status: DC

## 2011-04-04 MED ORDER — ISOSORBIDE DINITRATE 5 MG PO TABS: 5.0000 mg | ORAL_TABLET | Freq: Two times a day (BID) | ORAL | Status: DC

## 2011-04-04 NOTE — Patient Instructions (Signed)
Your physician wants you to follow-up in: 6 months  You will receive a reminder letter in the mail two months in advance. If you don't receive a letter, please call our office to schedule the follow-up appointment.  Your physician recommends that you continue on your current medications as directed. Please refer to the Current Medication list given to you today.  

## 2011-04-04 NOTE — Assessment & Plan Note (Signed)
BP well controlled. No changes.  

## 2011-04-04 NOTE — Progress Notes (Signed)
History of Present Illness:[name] is a 77 yo WM with history of CAD who has been followed in the past by Dr. Juanda Chance. He is here today for cardiac follow up. I saw him for the first time in January 2012 and last saw him in the office in September 2012. He underwent catheterization in October 2011 because of CP and was found to have a tight lesion in a large posterolateral branch of the right coronary artery and a tight lesion in the proximal LAD and complete occlusion of the distal LAD. Dr. Juanda Chance fixed the lesions in the posterior lateral branch of the right coronary and the mid LAD with drug-eluting stents in October 2011.   He has done well. He had hernia surgery in November 2012 and has been off of Effient since then. He has had no recent chest pain or SOB. His HCTZ was stopped in primary care.   His primary care doctor is Dr. Barron Alvine. Lipids followed in primary care. Most recent Total chol: 133, HDL 73, LDL 46   01/31/11.    Past Medical History  Diagnosis Date  . HTN (hypertension)   . CAD (coronary artery disease) 01/2010    S/P drug eluting stents LAD and PL branch of RCA Dr. Juanda Chance  . BPH (benign prostatic hypertrophy)   . Hernia     Past Surgical History  Procedure Date  . Heart stents   . Hernia repair 01/08/11    [facility]    Current Outpatient Prescriptions  Medication Sig Dispense Refill  . aspirin 81 MG tablet Take 81 mg by mouth daily.        [name] atenolol (TENORMIN) 25 MG tablet Take 25 mg by mouth daily.        . brimonidine (ALPHAGAN) 0.2 % ophthalmic solution Place 1 drop into the right eye 2 (two) times daily.        . cholecalciferol (VITAMIN D) 1000 UNITS tablet Take 1,000 Units by mouth daily.        . colchicine (COLCRYS) 0.6 MG tablet Take 0.6 mg by mouth daily.      . finasteride (PROSCAR) 5 MG tablet Take 5 mg by mouth daily.        . isosorbide dinitrate (ISORDIL) 5 MG tablet Take 5 mg by mouth 2 (two) times daily.        [name] lisinopril (PRINIVIL,ZESTRIL)  10 MG tablet Take 10 mg by mouth daily.        . nitroGLYCERIN (NITROSTAT) 0.4 MG SL tablet Place 1 tablet (0.4 mg total) under the tongue every 5 (five) minutes as needed for chest pain.  25 tablet  6  . pravastatin (PRAVACHOL) 40 MG tablet Take 40 mg by mouth at bedtime.        . timolol (TIMOPTIC) 0.5 % ophthalmic solution Place 1 drop into the right eye daily.          No Known Allergies  History   Social History  . Marital Status: Married    Spouse Name: N/A    Number of Children: N/A  . Years of Education: N/A   Occupational History  . Not on file.   Social History Main Topics  . Smoking status: Never Smoker   . Smokeless tobacco: Never Used  . Alcohol Use: No  . Drug Use: No  . Sexually Active: Not on file   Other Topics Concern  . Not on file   Social History Narrative   Married  Family History  Problem Relation Age of Onset  . Heart failure Mother     Review of Systems:  As stated in the HPI and otherwise negative.   BP 118/54  Pulse 59  Ht 5\' 10"  (1.778 m)  Wt 162 lb (73.483 kg)  BMI 23.24 kg/m2  Physical Examination: General: Well developed, well nourished, NAD HEENT: OP clear, mucus membranes moist SKIN: warm, dry. No rashes. Neuro: No focal deficits Musculoskeletal: Muscle strength 5/5 all ext Psychiatric: Mood and affect normal Neck: No JVD, no carotid bruits, no thyromegaly, no lymphadenopathy. Lungs:Clear bilaterally, no wheezes, rhonci, crackles Cardiovascular: Regular rate and rhythm. No murmurs, gallops or rubs. Abdomen:Soft. Bowel sounds present. Non-tender.  Extremities: No lower extremity edema. Pulses are 2 + in the bilateral DP/PT.

## 2011-04-04 NOTE — Assessment & Plan Note (Addendum)
Stable. No changes. Will continue ASA, statin, beta blocker, Ace-inh, imdur. Will not restart his Effient. He was a non-responder to Plavix in 2011.

## 2011-09-24 ENCOUNTER — Ambulatory Visit (INDEPENDENT_AMBULATORY_CARE_PROVIDER_SITE_OTHER): Payer: Medicare Other | Admitting: Cardiovascular Disease

## 2011-09-24 ENCOUNTER — Encounter: Payer: Self-pay | Admitting: Cardiovascular Disease

## 2011-09-24 VITALS — BP 106/57 | HR 57 | Ht 70.0 in | Wt 160.0 lb

## 2011-09-24 DIAGNOSIS — I2510 Atherosclerotic heart disease of native coronary artery without angina pectoris: Secondary | ICD-10-CM

## 2011-09-24 MED ORDER — HYDROCHLOROTHIAZIDE 25 MG PO TABS: 25.0000 mg | ORAL_TABLET | Freq: Every day | ORAL | Status: DC

## 2011-09-24 MED ORDER — NITROGLYCERIN 0.4 MG SL SUBL: 0.4000 mg | SUBLINGUAL_TABLET | SUBLINGUAL | Status: DC | PRN

## 2011-09-24 MED ORDER — PRAVASTATIN SODIUM 40 MG PO TABS: 40.0000 mg | ORAL_TABLET | Freq: Every day | ORAL | Status: DC

## 2011-09-24 MED ORDER — ATENOLOL 25 MG PO TABS: 25.0000 mg | ORAL_TABLET | Freq: Every day | ORAL | Status: DC

## 2011-09-24 MED ORDER — ISOSORBIDE DINITRATE 5 MG PO TABS: 5.0000 mg | ORAL_TABLET | Freq: Two times a day (BID) | ORAL | Status: DC

## 2011-09-24 MED ORDER — LISINOPRIL 10 MG PO TABS: 10.0000 mg | ORAL_TABLET | Freq: Every day | ORAL | Status: DC

## 2011-09-24 NOTE — Progress Notes (Signed)
History of Present Illness: Harold [name] is a 77 yo WM with history of CAD who has been followed in the past by Dr. Juanda Chance. He is here today for cardiac follow up. I saw him for the first time in January 2012 and last saw him in the office in September 2012. He underwent catheterization in October 2011 because of CP and was found to have a tight lesion in a large posterolateral branch of the right coronary artery and a tight lesion in the proximal LAD and complete occlusion of the distal LAD. Dr. Juanda Chance fixed the lesions in the posterior lateral branch of the right coronary and the mid LAD with drug-eluting stents in October 2011.  He has done well. He had hernia surgery in November 2012 and has been off of Effient since then.   He is here for cardiac follow up. He has had no recent chest pain or SOB. He does report left arm pain. He notes having left arm pain before his stents. At that time, he had vise-like pain in his chest before his stents. He is very active and has no exertional chest pain or pressure.    Primary Care Physician: Barron Alvine  Last Lipid Profile: Lipids followed in primary care. Most recent Total chol: 133, HDL 73, LDL 46 01/31/11.    Past Medical History  Diagnosis Date  . HTN (hypertension)   . CAD (coronary artery disease) 01/2010    S/P drug eluting stents LAD and PL branch of RCA Dr. Juanda Chance  . BPH (benign prostatic hypertrophy)   . Hernia     Past Surgical History  Procedure Date  . Heart stents   . Hernia repair 01/08/11    [facility]    Current Outpatient Prescriptions  Medication Sig Dispense Refill  . aspirin 81 MG tablet Take 81 mg by mouth daily.        [name] atenolol (TENORMIN) 25 MG tablet Take 25 mg by mouth daily.        . brimonidine (ALPHAGAN) 0.2 % ophthalmic solution Place 1 drop into the right eye 2 (two) times daily.        . cholecalciferol (VITAMIN D) 1000 UNITS tablet Take 1,000 Units by mouth daily.        . finasteride (PROSCAR) 5 MG tablet Take  5 mg by mouth daily.        . hydrochlorothiazide (HYDRODIURIL) 25 MG tablet 1 tab daily      . isosorbide dinitrate (ISORDIL) 5 MG tablet Take 1 tablet (5 mg total) by mouth 2 (two) times daily.  180 tablet  3  . lisinopril (PRINIVIL,ZESTRIL) 10 MG tablet Take 10 mg by mouth daily.        . nitroGLYCERIN (NITROSTAT) 0.4 MG SL tablet Place 1 tablet (0.4 mg total) under the tongue every 5 (five) minutes as needed for chest pain.  25 tablet  6  . pravastatin (PRAVACHOL) 40 MG tablet Take 1 tablet (40 mg total) by mouth at bedtime.  90 tablet  3  . timolol (TIMOPTIC) 0.5 % ophthalmic solution Place 1 drop into the right eye daily.          No Known Allergies  History   Social History  . Marital Status: Married    Spouse Name: N/A    Number of Children: N/A  . Years of Education: N/A   Occupational History  . Not on file.   Social History Main Topics  . Smoking status: Never Smoker   .  Smokeless tobacco: Never Used  . Alcohol Use: No  . Drug Use: No  . Sexually Active: Not on file   Other Topics Concern  . Not on file   Social History Narrative   Married    Family History  Problem Relation Age of Onset  . Heart failure Mother     Review of Systems:  As stated in the HPI and otherwise negative.   BP 106/57  Pulse 57  Ht 5\' 10"  (1.778 m)  Wt 160 lb (72.576 kg)  BMI 22.96 kg/m2  Physical Examination: General: Well developed, well nourished, NAD HEENT: OP clear, mucus membranes moist SKIN: warm, dry. No rashes. Neuro: No focal deficits Musculoskeletal: Muscle strength 5/5 all ext Psychiatric: Mood and affect normal Neck: No JVD, no carotid bruits, no thyromegaly, no lymphadenopathy. Lungs:Clear bilaterally, no wheezes, rhonci, crackles Cardiovascular: Huston Foley, regular rhythm. No murmurs, gallops or rubs. Abdomen:Soft. Bowel sounds present. Non-tender.  Extremities: No lower extremity edema. Pulses are 2 + in the bilateral DP/PT.  EKG: Sinus brady, rate 53 bpm.

## 2011-09-24 NOTE — Assessment & Plan Note (Signed)
He is on good medical therapy. He does describe left arm pain which is similar but not the same as his presentation before his coronary stents. At that time, he had vise-like chest pressure. Will arrange exercise stress test to exclude ischemia.

## 2011-09-24 NOTE — Patient Instructions (Addendum)
Your physician wants you to follow-up in:  6 months. You will receive a reminder letter in the mail two months in advance. If you don't receive a letter, please call our office to schedule the follow-up appointment.  Your physician has requested that you have an exercise tolerance test. For further information please visit [email or web address]. Please also follow instruction sheet, as given.    

## 2011-10-04 ENCOUNTER — Encounter: Payer: Medicare Other | Admitting: Physician Assistant

## 2011-10-04 ENCOUNTER — Encounter: Payer: Self-pay | Admitting: Cardiovascular Disease

## 2011-10-04 ENCOUNTER — Encounter: Payer: Self-pay | Admitting: Nurse Practitioner

## 2011-10-04 ENCOUNTER — Encounter: Payer: Medicare Other | Admitting: Nurse Practitioner

## 2011-10-04 ENCOUNTER — Ambulatory Visit (INDEPENDENT_AMBULATORY_CARE_PROVIDER_SITE_OTHER): Payer: Medicare Other | Admitting: Nurse Practitioner

## 2011-10-04 VITALS — BP 132/64 | HR 62 | Ht 70.0 in | Wt 160.0 lb

## 2011-10-04 DIAGNOSIS — I2510 Atherosclerotic heart disease of native coronary artery without angina pectoris: Secondary | ICD-10-CM

## 2011-10-04 DIAGNOSIS — R079 Chest pain, unspecified: Secondary | ICD-10-CM

## 2011-10-04 NOTE — Progress Notes (Signed)
Exercise Treadmill Test  Pre-Exercise Testing Evaluation Rhythm: normal sinus  Rate: 62   PR:  .19 QRS:  .11  QT:  .39 QTc: .39           Test  Exercise Tolerance Test Ordering MD: Melene Muller, MD  Interpreting MD: Marca Ancona, MD  Unique Test No: 1  Treadmill:  1  Indication for ETT: chest pain - rule out ischemia  Contraindication to ETT: No   Stress Modality: exercise - treadmill  Cardiac Imaging Performed: non   Protocol: standard Bruce - maximal  Max BP:  186/75  Max MPHR (bpm):   85% MPR (bpm):  124  MPHR obtained (bpm):  123 % MPHR obtained:  84%  Reached 84% MPHR (min:sec):  7:00 Total Exercise Time (min-sec):  7:00  Workload in METS:  8.5 Borg Scale: 15  Reason ETT Terminated:  patient's desire to stop    ST Segment Analysis At Rest: normal ST segments - no evidence of significant ST depression With Exercise: no evidence of significant ST depression  Other Information Arrhythmia:  No Angina during ETT:  absent (0) Quality of ETT:  non-diagnostic  ETT Interpretation:  normal - no evidence of ischemia by ST analysis; but target not achieved.  Comments: Patient presents today for routine GXT. He is seen for Dr. Clifton Yahir. He has known CAD with prior PCI. Has had some atypical left arm pain that is unlike his prior chest pain syndrome. He exercised on the standard Bruce protocol for a total of 7 minutes. His last dose of his beta blocker was yesterday. He did hold today's dose. He exhibited fair exercise tolerance. Target heart rate of 124 not achieved. Maximum heart rate was 123. Adequate blood pressure response. Clinically negative for angina. No left arm pain. Occasional PVC noted but no significant arrhythmia. Oxygen saturation was 98%.  The test was stopped due to fatigue. EKG was negative for significant ST depression.   Recommendations: He is reassured. No change in his current treatment plan. He is to let us know if he has further problems. Otherwise,  will see him back in January as scheduled. Patient is agreeable to this plan and will call if any problems develop in the interim.

## 2011-10-04 NOTE — Patient Instructions (Signed)
Continue with current medicines  We will see you in January  Call for any questions, problems or concerns.

## 2012-02-11 ENCOUNTER — Encounter: Payer: Self-pay | Admitting: Cardiovascular Disease

## 2012-03-31 ENCOUNTER — Encounter: Payer: Self-pay | Admitting: Cardiovascular Disease

## 2012-03-31 ENCOUNTER — Ambulatory Visit (INDEPENDENT_AMBULATORY_CARE_PROVIDER_SITE_OTHER): Payer: Medicare Other | Admitting: Cardiovascular Disease

## 2012-03-31 VITALS — BP 116/66 | HR 67 | Ht 70.0 in | Wt 171.0 lb

## 2012-03-31 DIAGNOSIS — I2510 Atherosclerotic heart disease of native coronary artery without angina pectoris: Secondary | ICD-10-CM

## 2012-03-31 MED ORDER — NITROGLYCERIN 0.4 MG SL SUBL: 0.4000 mg | SUBLINGUAL_TABLET | SUBLINGUAL | Status: DC | PRN

## 2012-03-31 MED ORDER — LISINOPRIL 10 MG PO TABS: 10.0000 mg | ORAL_TABLET | Freq: Every day | ORAL | Status: DC

## 2012-03-31 MED ORDER — ISOSORBIDE DINITRATE 5 MG PO TABS: 5.0000 mg | ORAL_TABLET | Freq: Two times a day (BID) | ORAL | Status: DC

## 2012-03-31 MED ORDER — ATENOLOL 25 MG PO TABS: 25.0000 mg | ORAL_TABLET | Freq: Every day | ORAL | Status: DC

## 2012-03-31 MED ORDER — HYDROCHLOROTHIAZIDE 25 MG PO TABS: 25.0000 mg | ORAL_TABLET | Freq: Every day | ORAL | Status: DC

## 2012-03-31 MED ORDER — PRAVASTATIN SODIUM 40 MG PO TABS: 40.0000 mg | ORAL_TABLET | Freq: Every day | ORAL | Status: DC

## 2012-03-31 NOTE — Patient Instructions (Signed)
Your physician wants you to follow-up in:  12 months.  You will receive a reminder letter in the mail two months in advance. If you don't receive a letter, please call our office to schedule the follow-up appointment.   

## 2012-03-31 NOTE — Progress Notes (Signed)
History of Present Illness: Harold [name] is a 78 yo WM with history of CAD who has been followed in the past by Dr. Juanda Chance. He is here today for cardiac follow up. He underwent catheterization in October 2011 because of CP and was found to have a tight lesion in a large posterolateral branch of the right coronary artery and a tight lesion in the proximal LAD and complete occlusion of the distal LAD. Dr. Juanda Chance fixed the lesions in the posterior lateral branch of the right coronary and the mid LAD with drug-eluting stents in October 2011. He had hernia surgery in November 2012 and has been off of Effient since then. At the last visit in July 2013, he had c/o left arm pain. Exercise stress test 10/04/11 with no evidence of ischemia.   He is here for cardiac follow up. He has had no recent chest pain or SOB. He is very active and has no exertional chest pain or pressure.   Primary Care Physician: Barron Alvine  Last Lipid Profile: Followed in primary care.    Past Medical History  Diagnosis Date  . HTN (hypertension)   . CAD (coronary artery disease) 01/2010    S/P drug eluting stents LAD and PL branch of RCA Dr. Juanda Chance  . BPH (benign prostatic hypertrophy)   . Hernia   . HLD (hyperlipidemia)     Past Surgical History  Procedure Date  . Heart stents   . Hernia repair 01/08/11    [facility]    Current Outpatient Prescriptions  Medication Sig Dispense Refill  . aspirin 81 MG tablet Take 81 mg by mouth daily.        [name] atenolol (TENORMIN) 25 MG tablet Take 1 tablet (25 mg total) by mouth daily.  90 tablet  3  . brimonidine (ALPHAGAN) 0.2 % ophthalmic solution Place 1 drop into the right eye 2 (two) times daily.        . cholecalciferol (VITAMIN D) 1000 UNITS tablet Take 1,000 Units by mouth daily.        . finasteride (PROSCAR) 5 MG tablet Take 5 mg by mouth daily.        . hydrochlorothiazide (HYDRODIURIL) 25 MG tablet Take 1 tablet (25 mg total) by mouth daily. 1 tab daily  90 tablet  3  .  isosorbide dinitrate (ISORDIL) 5 MG tablet Take 1 tablet (5 mg total) by mouth 2 (two) times daily.  180 tablet  3  . lisinopril (PRINIVIL,ZESTRIL) 10 MG tablet Take 1 tablet (10 mg total) by mouth daily.  90 tablet  3  . nitroGLYCERIN (NITROSTAT) 0.4 MG SL tablet Place 1 tablet (0.4 mg total) under the tongue every 5 (five) minutes as needed for chest pain.  25 tablet  6  . pravastatin (PRAVACHOL) 40 MG tablet Take 1 tablet (40 mg total) by mouth at bedtime.  90 tablet  3  . timolol (TIMOPTIC) 0.5 % ophthalmic solution Place 1 drop into the right eye daily.          No Known Allergies  History   Social History  . Marital Status: Married    Spouse Name: N/A    Number of Children: N/A  . Years of Education: N/A   Occupational History  . Not on file.   Social History Main Topics  . Smoking status: Never Smoker   . Smokeless tobacco: Never Used  . Alcohol Use: No  . Drug Use: No  . Sexually Active: Not on file  Other Topics Concern  . Not on file   Social History Narrative   Married    Family History  Problem Relation Age of Onset  . Heart failure Mother     Review of Systems:  As stated in the HPI and otherwise negative.   BP 116/66  Pulse 67  Ht 5\' 10"  (1.778 m)  Wt 171 lb (77.565 kg)  BMI 24.54 kg/m2  SpO2 97%  Physical Examination: General: Well developed, well nourished, NAD HEENT: OP clear, mucus membranes moist SKIN: warm, dry. No rashes. Neuro: No focal deficits Musculoskeletal: Muscle strength 5/5 all ext Psychiatric: Mood and affect normal Neck: No JVD, no carotid bruits, no thyromegaly, no lymphadenopathy. Lungs:Clear bilaterally, no wheezes, rhonci, crackles Cardiovascular: Regular rate and rhythm. No murmurs, gallops or rubs. Abdomen:Soft. Bowel sounds present. Non-tender.  Extremities: No lower extremity edema. Pulses are 2 + in the bilateral DP/PT.  Assessment and Plan:   1. CAD, NATIVE VESSEL: Stable.  He is on good medical therapy. No  changes today.

## 2013-03-23 ENCOUNTER — Ambulatory Visit (INDEPENDENT_AMBULATORY_CARE_PROVIDER_SITE_OTHER): Payer: Medicare Other | Admitting: Cardiovascular Disease

## 2013-03-23 ENCOUNTER — Encounter: Payer: Self-pay | Admitting: Cardiovascular Disease

## 2013-03-23 VITALS — BP 120/68 | HR 59 | Ht 70.0 in | Wt 178.0 lb

## 2013-03-23 DIAGNOSIS — I10 Essential (primary) hypertension: Secondary | ICD-10-CM

## 2013-03-23 DIAGNOSIS — E785 Hyperlipidemia, unspecified: Secondary | ICD-10-CM

## 2013-03-23 DIAGNOSIS — I2510 Atherosclerotic heart disease of native coronary artery without angina pectoris: Secondary | ICD-10-CM

## 2013-03-23 MED ORDER — ISOSORBIDE DINITRATE 5 MG PO TABS
5.0000 mg | ORAL_TABLET | Freq: Two times a day (BID) | ORAL | Status: DC
Start: 2013-03-23 — End: 2014-08-25

## 2013-03-23 MED ORDER — HYDROCHLOROTHIAZIDE 25 MG PO TABS
25.0000 mg | ORAL_TABLET | Freq: Every day | ORAL | Status: DC
Start: 2013-03-23 — End: 2014-08-25

## 2013-03-23 MED ORDER — ATENOLOL 25 MG PO TABS
25.0000 mg | ORAL_TABLET | Freq: Every day | ORAL | Status: DC
Start: 2013-03-23 — End: 2014-08-25

## 2013-03-23 MED ORDER — LISINOPRIL 10 MG PO TABS: 10.0000 mg | ORAL_TABLET | Freq: Every day | ORAL | Status: DC

## 2013-03-23 NOTE — Patient Instructions (Signed)
Your physician wants you to follow-up in: 3 months. You will receive a reminder letter in the mail two months in advance. If you don't receive a letter, please call our office to schedule the follow-up appointment.   

## 2013-03-23 NOTE — Progress Notes (Signed)
History of Present Illness: [name] is a 79 yo WM with history of CAD who has been followed in the past by Dr. Olevia Perches. He is here today for cardiac follow up. He underwent catheterization in October 2011 because of CP and was found to have a tight lesion in a large posterolateral branch of the right coronary artery and a tight lesion in the proximal LAD and complete occlusion of the distal LAD. Dr. Olevia Perches fixed the lesions in the posterior lateral branch of the right coronary and the mid LAD with drug-eluting stents in October 2011. Exercise stress test 10/04/11 with no evidence of ischemia.   He is here for cardiac follow up. He has had no recent chest pain or SOB. He is very active and has no exertional chest pain or pressure. He does complain of pain in his joints.   Primary Care Physician: [name]  Last Lipid Profile: Followed in primary care.    Past Medical History  Diagnosis Date  . HTN (hypertension)   . CAD (coronary artery disease) 01/2010    S/P drug eluting stents LAD and PL branch of RCA Dr. Olevia Perches  . BPH (benign prostatic hypertrophy)   . Hernia   . HLD (hyperlipidemia)     Past Surgical History  Procedure Laterality Date  . Heart stents    . Hernia repair  01/08/11    [facility]    Current Outpatient Prescriptions  Medication Sig Dispense Refill  . aspirin 81 MG tablet Take 81 mg by mouth daily.        [name] atenolol (TENORMIN) 25 MG tablet Take 1 tablet (25 mg total) by mouth daily.  90 tablet  3  . brimonidine (ALPHAGAN) 0.2 % ophthalmic solution Place 1 drop into the right eye 2 (two) times daily.        . cholecalciferol (VITAMIN D) 1000 UNITS tablet Take 1,000 Units by mouth daily.        . finasteride (PROSCAR) 5 MG tablet Take 5 mg by mouth daily.        . hydrochlorothiazide (HYDRODIURIL) 25 MG tablet Take 1 tablet (25 mg total) by mouth daily. 1 tab daily  90 tablet  3  . isosorbide dinitrate (ISORDIL) 5 MG tablet Take 1 tablet (5 mg total) by mouth 2 (two)  times daily.  180 tablet  3  . lisinopril (PRINIVIL,ZESTRIL) 10 MG tablet Take 1 tablet (10 mg total) by mouth daily.  90 tablet  3  . nitroGLYCERIN (NITROSTAT) 0.4 MG SL tablet Place 1 tablet (0.4 mg total) under the tongue every 5 (five) minutes as needed for chest pain.  25 tablet  6  . pravastatin (PRAVACHOL) 40 MG tablet Take 1 tablet (40 mg total) by mouth at bedtime.  90 tablet  3  . timolol (TIMOPTIC) 0.5 % ophthalmic solution Place 1 drop into the right eye daily.         No current facility-administered medications for this visit.    No Known Allergies  History   Social History  . Marital Status: Married    Spouse Name: N/A    Number of Children: N/A  . Years of Education: N/A   Occupational History  . Not on file.   Social History Main Topics  . Smoking status: Never Smoker   . Smokeless tobacco: Never Used  . Alcohol Use: No  . Drug Use: No  . Sexual Activity: Not on file   Other Topics Concern  . Not on  file   Social History Narrative   Married    Family History  Problem Relation Age of Onset  . Heart failure Mother     Review of Systems:  As stated in the HPI and otherwise negative.   BP 120/68  Pulse 59  Ht 5\' 10"  (1.778 m)  Wt 178 lb (80.74 kg)  BMI 25.54 kg/m2  Physical Examination: General: Well developed, well nourished, NAD HEENT: OP clear, mucus membranes moist SKIN: warm, dry. No rashes. Neuro: No focal deficits Musculoskeletal: Muscle strength 5/5 all ext Psychiatric: Mood and affect normal Neck: No JVD, no carotid bruits, no thyromegaly, no lymphadenopathy. Lungs:Clear bilaterally, no wheezes, rhonci, crackles Cardiovascular: Regular rate and rhythm. No murmurs, gallops or rubs. Abdomen:Soft. Bowel sounds present. Non-tender.  Extremities: No lower extremity edema. Pulses are 2 + in the bilateral DP/PT.  EKG: Sinus, rate 59 bpm.   Assessment and Plan:   1. CAD: Stable.  He is on good medical therapy. Continue ASA, beta blocker,  Imdur, Ace-inh. No changes today. He was a non-responder to Plavix. We discussed the DAPT trial but will not use Effient given age.   2. HTN: BP controlled. No changes today.   3. Hyperlipidemia: Will hold statin with recent muscle aches. Lipids followed in primary care office.

## 2013-03-30 ENCOUNTER — Ambulatory Visit: Payer: Medicare Other | Admitting: Cardiovascular Disease

## 2013-07-19 ENCOUNTER — Encounter: Payer: Self-pay | Admitting: Cardiovascular Disease

## 2013-07-19 ENCOUNTER — Ambulatory Visit (INDEPENDENT_AMBULATORY_CARE_PROVIDER_SITE_OTHER): Payer: Medicare Other | Admitting: Cardiovascular Disease

## 2013-07-19 VITALS — BP 110/54 | HR 64 | Ht 70.0 in | Wt 171.4 lb

## 2013-07-19 DIAGNOSIS — I10 Essential (primary) hypertension: Secondary | ICD-10-CM

## 2013-07-19 DIAGNOSIS — E785 Hyperlipidemia, unspecified: Secondary | ICD-10-CM

## 2013-07-19 DIAGNOSIS — I2510 Atherosclerotic heart disease of native coronary artery without angina pectoris: Secondary | ICD-10-CM

## 2013-07-19 NOTE — Patient Instructions (Signed)
Your physician wants you to follow-up in:  12 months.  You will receive a reminder letter in the mail two months in advance. If you don't receive a letter, please call our office to schedule the follow-up appointment.   

## 2013-07-19 NOTE — Progress Notes (Signed)
History of Present Illness: 79 yo WM with history of CAD, HTN, HLD here today for cardiac follow up. He has been followed in the past by Dr. Olevia Perches. He is here today for cardiac follow up. He underwent catheterization in October 2011 because of CP and was found to have a tight lesion in a large posterolateral branch of the right coronary artery and a tight lesion in the proximal LAD and complete occlusion of the distal LAD. Dr. Olevia Perches fixed the lesions in the posterior lateral branch of the right coronary and the mid LAD with drug-eluting stents in October 2011. Exercise stress test 10/04/11 with no evidence of ischemia.   He is here for cardiac follow up. He has had no recent chest pain or SOB. He is very active and has no exertional chest pain or pressure. He has been cutting wood all stone. He does complain of pain in his joints but this did not improve off of statin so he restarted.   Primary Care Physician: [name]  Last Lipid Profile: Followed in primary care.    Past Medical History  Diagnosis Date  . HTN (hypertension)   . CAD (coronary artery disease) 01/2010    S/P drug eluting stents LAD and PL branch of RCA Dr. Olevia Perches  . BPH (benign prostatic hypertrophy)   . Hernia   . HLD (hyperlipidemia)     Past Surgical History  Procedure Laterality Date  . Heart stents    . Hernia repair  01/08/11    [facility]    Current Outpatient Prescriptions  Medication Sig Dispense Refill  . aspirin 81 MG tablet Take 81 mg by mouth daily.        [name] atenolol (TENORMIN) 25 MG tablet Take 1 tablet (25 mg total) by mouth daily.  90 tablet  3  . brimonidine (ALPHAGAN) 0.2 % ophthalmic solution Place 1 drop into the right eye 2 (two) times daily.        . cholecalciferol (VITAMIN D) 1000 UNITS tablet Take 1,000 Units by mouth daily.        . finasteride (PROSCAR) 5 MG tablet Take 5 mg by mouth daily.        . hydrochlorothiazide (HYDRODIURIL) 25 MG tablet Take 1 tablet (25 mg total) by mouth  daily.  90 tablet  3  . isosorbide dinitrate (ISORDIL) 5 MG tablet Take 1 tablet (5 mg total) by mouth 2 (two) times daily.  180 tablet  3  . lisinopril (PRINIVIL,ZESTRIL) 10 MG tablet Take 1 tablet (10 mg total) by mouth daily.  90 tablet  3  . nitroGLYCERIN (NITROSTAT) 0.4 MG SL tablet Place 1 tablet (0.4 mg total) under the tongue every 5 (five) minutes as needed for chest pain.  25 tablet  6  . timolol (TIMOPTIC) 0.5 % ophthalmic solution Place 1 drop into the right eye daily.        . pravastatin (PRAVACHOL) 40 MG tablet Take 1 tablet (40 mg total) by mouth at bedtime.  90 tablet  3   No current facility-administered medications for this visit.    No Known Allergies  History   Social History  . Marital Status: Married    Spouse Name: N/A    Number of Children: N/A  . Years of Education: N/A   Occupational History  . Not on file.   Social History Main Topics  . Smoking status: Never Smoker   . Smokeless tobacco: Never Used  . Alcohol Use: No  .  Drug Use: No  . Sexual Activity: Not on file   Other Topics Concern  . Not on file   Social History Narrative   Married    Family History  Problem Relation Age of Onset  . Heart failure Mother     Review of Systems:  As stated in the HPI and otherwise negative.   BP 110/54  Pulse 64  Ht 5\' 10"  (1.778 m)  Wt 171 lb 6.4 oz (77.747 kg)  BMI 24.59 kg/m2  SpO2 99%  Physical Examination: General: Well developed, well nourished, NAD HEENT: OP clear, mucus membranes moist SKIN: warm, dry. No rashes. Neuro: No focal deficits Musculoskeletal: Muscle strength 5/5 all ext Psychiatric: Mood and affect normal Neck: No JVD, no carotid bruits, no thyromegaly, no lymphadenopathy. Lungs:Clear bilaterally, no wheezes, rhonci, crackles Cardiovascular: Regular rate and rhythm. No murmurs, gallops or rubs. Abdomen:Soft. Bowel sounds present. Non-tender.  Extremities: No lower extremity edema. Pulses are 2 + in the bilateral  DP/PT.  Assessment and Plan:   1. CAD: Stable.  He is on good medical therapy. Continue ASA, beta blocker, Imdur, Ace-inh. No changes today. He was a non-responder to Plavix. We discussed the DAPT trial but will not use Effient given age.   2. HTN: BP controlled. No changes today.   3. Hyperlipidemia: Will continue statin. Lipids followed in primary care.

## 2013-08-18 ENCOUNTER — Encounter: Payer: Self-pay | Admitting: Cardiovascular Disease

## 2014-08-05 ENCOUNTER — Ambulatory Visit: Payer: Self-pay | Admitting: Cardiovascular Disease

## 2014-08-25 ENCOUNTER — Ambulatory Visit (INDEPENDENT_AMBULATORY_CARE_PROVIDER_SITE_OTHER): Payer: Medicare Other | Admitting: Cardiovascular Disease

## 2014-08-25 ENCOUNTER — Encounter: Payer: Self-pay | Admitting: Cardiovascular Disease

## 2014-08-25 VITALS — BP 100/50 | HR 57

## 2014-08-25 DIAGNOSIS — E785 Hyperlipidemia, unspecified: Secondary | ICD-10-CM | POA: Diagnosis not present

## 2014-08-25 DIAGNOSIS — I10 Essential (primary) hypertension: Secondary | ICD-10-CM

## 2014-08-25 DIAGNOSIS — I2510 Atherosclerotic heart disease of native coronary artery without angina pectoris: Secondary | ICD-10-CM | POA: Diagnosis not present

## 2014-08-25 MED ORDER — LISINOPRIL 10 MG PO TABS: 10.0000 mg | ORAL_TABLET | Freq: Every day | ORAL | Status: DC

## 2014-08-25 MED ORDER — HYDROCHLOROTHIAZIDE 25 MG PO TABS: 25.0000 mg | ORAL_TABLET | Freq: Every day | ORAL | Status: DC

## 2014-08-25 MED ORDER — ISOSORBIDE DINITRATE 5 MG PO TABS: 5.0000 mg | ORAL_TABLET | Freq: Two times a day (BID) | ORAL | Status: DC

## 2014-08-25 MED ORDER — PRAVASTATIN SODIUM 40 MG PO TABS: 40.0000 mg | ORAL_TABLET | Freq: Every day | ORAL | Status: DC

## 2014-08-25 MED ORDER — ATENOLOL 25 MG PO TABS: 25.0000 mg | ORAL_TABLET | Freq: Every day | ORAL | Status: DC

## 2014-08-25 NOTE — Patient Instructions (Signed)
Medication Instructions:  Your physician recommends that you continue on your current medications as directed. Please refer to the Current Medication list given to you today.   Labwork: none  Testing/Procedures: none  Follow-Up: Your physician wants you to follow-up in: 6 months.  You will receive a reminder letter in the mail two months in advance. If you don't receive a letter, please call our office to schedule the follow-up appointment.       

## 2014-08-25 NOTE — Progress Notes (Signed)
Chief Complaint  Patient presents with  . Knee Pain     History of Present Illness: 80 yo WM with history of CAD, HTN, HLD here today for cardiac follow up. He has been followed in the past by Dr. Olevia Perches. He underwent catheterization in October 2011 because of chest pain and was found to have a tight lesion in a large posterolateral branch of the right coronary artery and a tight lesion in the proximal LAD and complete occlusion of the distal LAD. Dr. Olevia Perches fixed the lesions in the posterior lateral branch of the right coronary and the mid LAD with drug-eluting stents in October 2011. Exercise stress test 10/04/11 with no evidence of ischemia.   He is here for cardiac follow up. He has had no recent chest pain or SOB. He is very active and has no exertional chest pain or pressure. He does complain of pain in his knees. He is considering seeing orthopedics for evaluation for knee replacement.    Primary Care Physician: [name]  Last Lipid Profile: Followed in primary care.    Past Medical History  Diagnosis Date  . HTN (hypertension)   . CAD (coronary artery disease) 01/2010    S/P drug eluting stents LAD and PL branch of RCA Dr. Olevia Perches  . BPH (benign prostatic hypertrophy)   . Hernia   . HLD (hyperlipidemia)     Past Surgical History  Procedure Laterality Date  . Heart stents    . Hernia repair  01/08/11    [facility]    Current Outpatient Prescriptions  Medication Sig Dispense Refill  . aspirin 81 MG tablet Take 81 mg by mouth daily.      [name] atenolol (TENORMIN) 25 MG tablet Take 1 tablet (25 mg total) by mouth daily. 90 tablet 3  . brimonidine (ALPHAGAN) 0.2 % ophthalmic solution Place 1 drop into the right eye 2 (two) times daily.      . cholecalciferol (VITAMIN D) 1000 UNITS tablet Take 1,000 Units by mouth daily.      . finasteride (PROSCAR) 5 MG tablet Take 5 mg by mouth daily.      . hydrochlorothiazide (HYDRODIURIL) 25 MG tablet Take 1 tablet (25 mg total) by mouth  daily. 90 tablet 3  . isosorbide dinitrate (ISORDIL) 5 MG tablet Take 1 tablet (5 mg total) by mouth 2 (two) times daily. 180 tablet 3  . lisinopril (PRINIVIL,ZESTRIL) 10 MG tablet Take 1 tablet (10 mg total) by mouth daily. 90 tablet 3  . Naproxen Sodium (ALEVE PO) Take 1 tablet by mouth daily as needed (knee pain). Pt. Not sure of dose    . pravastatin (PRAVACHOL) 40 MG tablet Take 1 tablet (40 mg total) by mouth at bedtime. 90 tablet 3  . timolol (TIMOPTIC) 0.5 % ophthalmic solution Place 1 drop into the right eye daily.      . nitroGLYCERIN (NITROSTAT) 0.4 MG SL tablet Place 1 tablet (0.4 mg total) under the tongue every 5 (five) minutes as needed for chest pain. 25 tablet 6   No current facility-administered medications for this visit.    No Known Allergies  History   Social History  . Marital Status: Married    Spouse Name: N/A  . Number of Children: N/A  . Years of Education: N/A   Occupational History  . Not on file.   Social History Main Topics  . Smoking status: Never Smoker   . Smokeless tobacco: Never Used  . Alcohol Use: No  .  Drug Use: No  . Sexual Activity: Not on file   Other Topics Concern  . Not on file   Social History Narrative   Married    Family History  Problem Relation Age of Onset  . Heart failure Mother     Review of Systems:  As stated in the HPI and otherwise negative.   BP 100/50 mmHg  Pulse 57  Physical Examination: General: Well developed, well nourished, NAD HEENT: OP clear, mucus membranes moist SKIN: warm, dry. No rashes. Neuro: No focal deficits Musculoskeletal: Muscle strength 5/5 all ext Psychiatric: Mood and affect normal Neck: No JVD, no carotid bruits, no thyromegaly, no lymphadenopathy. Lungs:Clear bilaterally, no wheezes, rhonci, crackles Cardiovascular: Regular rate and rhythm. No murmurs, gallops or rubs. Abdomen:Soft. Bowel sounds present. Non-tender.  Extremities: No lower extremity edema. Pulses are 2 + in the  bilateral DP/PT.  EKG:  EKG is ordered today. The ekg ordered today demonstrates Sinus brady, rate 57 bpm  Recent Labs: No results found for requested labs within last 365 days.   Lipid Panel No results found for: CHOL, TRIG, HDL, CHOLHDL, VLDL, LDLCALC, LDLDIRECT   Wt Readings from Last 3 Encounters:  07/19/13 171 lb 6.4 oz (77.747 kg)  03/23/13 178 lb (80.74 kg)  03/31/12 171 lb (77.565 kg)     Other studies Reviewed: Additional studies/ records that were reviewed today include:  Review of the above records demonstrates:    Assessment and Plan:   1. CAD: Stable.  Continue ASA, beta blocker, Imdur, Ace-inh. No changes today. He was a non-responder to Plavix. If he needs knee replacement will need stress test.   2. HTN: BP controlled. No changes today.   3. Hyperlipidemia: Will continue statin. Lipids followed in primary care.    Current medicines are reviewed at length with the patient today.  The patient does not have concerns regarding medicines.  The following changes have been made:  no change  Labs/ tests ordered today include:  No orders of the defined types were placed in this encounter.    Disposition:   FU with me in 6 months  Signed, Lauree Chandler, MD 08/25/2014 10:17 AM    [facility] [place], [place], [place]  [address] Phone: [phone]; Fax: [phone]

## 2014-08-29 ENCOUNTER — Encounter: Payer: Self-pay | Admitting: Cardiovascular Disease

## 2015-02-23 DIAGNOSIS — Z85820 Personal history of malignant melanoma of skin: Secondary | ICD-10-CM | POA: Insufficient documentation

## 2015-03-30 ENCOUNTER — Ambulatory Visit (INDEPENDENT_AMBULATORY_CARE_PROVIDER_SITE_OTHER): Payer: Medicare Other | Admitting: Cardiovascular Disease

## 2015-03-30 ENCOUNTER — Encounter: Payer: Self-pay | Admitting: Cardiovascular Disease

## 2015-03-30 VITALS — BP 110/54 | HR 70 | Ht 70.0 in | Wt 176.0 lb

## 2015-03-30 DIAGNOSIS — I10 Essential (primary) hypertension: Secondary | ICD-10-CM | POA: Diagnosis not present

## 2015-03-30 DIAGNOSIS — E785 Hyperlipidemia, unspecified: Secondary | ICD-10-CM

## 2015-03-30 DIAGNOSIS — I2510 Atherosclerotic heart disease of native coronary artery without angina pectoris: Secondary | ICD-10-CM | POA: Diagnosis not present

## 2015-03-30 MED ORDER — ATENOLOL 25 MG PO TABS: 25.0000 mg | ORAL_TABLET | Freq: Every day | ORAL | Status: DC

## 2015-03-30 MED ORDER — PRAVASTATIN SODIUM 40 MG PO TABS: 40.0000 mg | ORAL_TABLET | Freq: Every day | ORAL | Status: DC

## 2015-03-30 MED ORDER — HYDROCHLOROTHIAZIDE 25 MG PO TABS: 25.0000 mg | ORAL_TABLET | Freq: Every day | ORAL | Status: DC

## 2015-03-30 MED ORDER — LISINOPRIL 10 MG PO TABS: 10.0000 mg | ORAL_TABLET | Freq: Every day | ORAL | Status: DC

## 2015-03-30 MED ORDER — ISOSORBIDE DINITRATE 5 MG PO TABS: 5.0000 mg | ORAL_TABLET | Freq: Two times a day (BID) | ORAL | Status: DC

## 2015-03-30 MED ORDER — NITROGLYCERIN 0.4 MG SL SUBL: 0.4000 mg | SUBLINGUAL_TABLET | SUBLINGUAL | Status: DC | PRN

## 2015-03-30 NOTE — Patient Instructions (Signed)

## 2015-03-30 NOTE — Progress Notes (Signed)
Chief Complaint  Patient presents with  . Follow-up    pt c/o dizziness upon standing   . Coronary Artery Disease     History of Present Illness: 81 yo WM with history of CAD, HTN, HLD here today for cardiac follow up. He has been followed in the past by Dr. Olevia Perches. He underwent catheterization in October 2011 because of chest pain and was found to have a severe stenosis in a large posterolateral branch of the right coronary artery and a severe stenosis in the proximal LAD and complete occlusion of the distal LAD. Dr. Olevia Perches placed drug eluting stents in the posterior lateral branch of the right coronary and the mid LAD in October 2011. Exercise stress test 10/04/11 with no evidence of ischemia.   He is here for cardiac follow up. He has had no recent chest pain or SOB. He is very active and has no exertional chest pain or pressure. He does have leg cramps when he wakes up. He is still having knee pain and is still thinking about knee replacement surgery.   Primary Care Physician: [name]  Last Lipid Profile: Followed in primary care.    Past Medical History  Diagnosis Date  . HTN (hypertension)   . CAD (coronary artery disease) 01/2010    S/P drug eluting stents LAD and PL branch of RCA Dr. Olevia Perches  . BPH (benign prostatic hypertrophy)   . Hernia   . HLD (hyperlipidemia)     Past Surgical History  Procedure Laterality Date  . Hernia repair  01/08/11    [facility]    Current Outpatient Prescriptions  Medication Sig Dispense Refill  . aspirin 81 MG tablet Take 81 mg by mouth daily.      [name] atenolol (TENORMIN) 25 MG tablet Take 1 tablet (25 mg total) by mouth daily. 90 tablet 3  . brimonidine (ALPHAGAN) 0.2 % ophthalmic solution Place 1 drop into the right eye 2 (two) times daily.      . cholecalciferol (VITAMIN D) 1000 UNITS tablet Take 1,000 Units by mouth daily.      . finasteride (PROSCAR) 5 MG tablet Take 5 mg by mouth daily.      . hydrochlorothiazide (HYDRODIURIL) 25 MG  tablet Take 1 tablet (25 mg total) by mouth daily. 90 tablet 3  . isosorbide dinitrate (ISORDIL) 5 MG tablet Take 1 tablet (5 mg total) by mouth 2 (two) times daily. 180 tablet 3  . lisinopril (PRINIVIL,ZESTRIL) 10 MG tablet Take 1 tablet (10 mg total) by mouth daily. 90 tablet 3  . Naproxen Sodium (ALEVE PO) Take 1 tablet by mouth daily as needed (knee pain). Pt. Not sure of dose    . pravastatin (PRAVACHOL) 40 MG tablet Take 1 tablet (40 mg total) by mouth at bedtime. 90 tablet 3  . timolol (TIMOPTIC) 0.5 % ophthalmic solution Place 1 drop into the right eye daily.      . nitroGLYCERIN (NITROSTAT) 0.4 MG SL tablet Place 1 tablet (0.4 mg total) under the tongue every 5 (five) minutes as needed for chest pain. 25 tablet 6   No current facility-administered medications for this visit.    No Known Allergies  Social History   Social History  . Marital Status: Married    Spouse Name: N/A  . Number of Children: N/A  . Years of Education: N/A   Occupational History  . Not on file.   Social History Main Topics  . Smoking status: Never Smoker   . Smokeless  tobacco: Never Used  . Alcohol Use: No  . Drug Use: No  . Sexual Activity: Not on file   Other Topics Concern  . Not on file   Social History Narrative   Married    Family History  Problem Relation Age of Onset  . Heart failure Mother     Review of Systems:  As stated in the HPI and otherwise negative.   BP 110/54 mmHg  Pulse 70  Ht 5\' 10"  (1.778 m)  Wt 176 lb (79.833 kg)  BMI 25.25 kg/m2  SpO2 98%  Physical Examination: General: Well developed, well nourished, NAD HEENT: OP clear, mucus membranes moist SKIN: warm, dry. No rashes. Neuro: No focal deficits Musculoskeletal: Muscle strength 5/5 all ext Psychiatric: Mood and affect normal Neck: No JVD, no carotid bruits, no thyromegaly, no lymphadenopathy. Lungs:Clear bilaterally, no wheezes, rhonci, crackles Cardiovascular: Regular rate and rhythm. No murmurs,  gallops or rubs. Abdomen:Soft. Bowel sounds present. Non-tender.  Extremities: No lower extremity edema. Pulses are 2 + in the bilateral DP/PT.  EKG:  EKG is ordered today. The ekg ordered today demonstrates Sinus brady, rate 57 bpm  Recent Labs: No results found for requested labs within last 365 days.   Lipid Panel No results found for: CHOL, TRIG, HDL, CHOLHDL, VLDL, LDLCALC, LDLDIRECT   Wt Readings from Last 3 Encounters:  03/30/15 176 lb (79.833 kg)  07/19/13 171 lb 6.4 oz (77.747 kg)  03/23/13 178 lb (80.74 kg)     Other studies Reviewed: Additional studies/ records that were reviewed today include:  Review of the above records demonstrates:    Assessment and Plan:   1. CAD: Stable.  Continue ASA, beta blocker, Imdur, Ace-inh. No changes today. He was a non-responder to Plavix. If he needs knee replacement will need stress test.   2. HTN: BP controlled. No changes today.   3. Hyperlipidemia: Will continue statin. Lipids followed in primary care.    Current medicines are reviewed at length with the patient today.  The patient does not have concerns regarding medicines.  The following changes have been made:  no change  Labs/ tests ordered today include:  No orders of the defined types were placed in this encounter.    Disposition:   FU with me in 12 months  Signed, Lauree Chandler, MD 03/30/2015 1:09 PM    [facility] [place], [place], [place]  [address] Phone: [phone]; Fax: [phone]

## 2016-03-13 ENCOUNTER — Encounter: Payer: Self-pay | Admitting: *Deleted

## 2016-04-03 ENCOUNTER — Ambulatory Visit: Payer: Medicare Other | Admitting: Cardiovascular Disease

## 2016-04-05 ENCOUNTER — Encounter: Payer: Self-pay | Admitting: Cardiovascular Disease

## 2016-04-05 ENCOUNTER — Ambulatory Visit (INDEPENDENT_AMBULATORY_CARE_PROVIDER_SITE_OTHER): Payer: Medicare Other | Admitting: Cardiovascular Disease

## 2016-04-05 VITALS — BP 120/60 | HR 64 | Ht 70.0 in | Wt 180.4 lb

## 2016-04-05 DIAGNOSIS — I10 Essential (primary) hypertension: Secondary | ICD-10-CM | POA: Diagnosis not present

## 2016-04-05 DIAGNOSIS — E7800 Pure hypercholesterolemia, unspecified: Secondary | ICD-10-CM | POA: Diagnosis not present

## 2016-04-05 DIAGNOSIS — I2510 Atherosclerotic heart disease of native coronary artery without angina pectoris: Secondary | ICD-10-CM | POA: Diagnosis not present

## 2016-04-05 MED ORDER — NITROGLYCERIN 0.4 MG SL SUBL: 0.4000 mg | SUBLINGUAL_TABLET | SUBLINGUAL | 6 refills | Status: DC | PRN

## 2016-04-05 MED ORDER — ISOSORBIDE DINITRATE 5 MG PO TABS
5.0000 mg | ORAL_TABLET | Freq: Two times a day (BID) | ORAL | 3 refills | Status: DC
Start: 2016-04-05 — End: 2017-04-02

## 2016-04-05 MED ORDER — PRAVASTATIN SODIUM 40 MG PO TABS
40.0000 mg | ORAL_TABLET | Freq: Every day | ORAL | 3 refills | Status: DC
Start: 2016-04-05 — End: 2017-04-02

## 2016-04-05 MED ORDER — LISINOPRIL 10 MG PO TABS: 10.0000 mg | ORAL_TABLET | Freq: Every day | ORAL | 3 refills | Status: DC

## 2016-04-05 MED ORDER — ATENOLOL 25 MG PO TABS: 25.0000 mg | ORAL_TABLET | Freq: Every day | ORAL | 3 refills | Status: DC

## 2016-04-05 MED ORDER — HYDROCHLOROTHIAZIDE 25 MG PO TABS: 25.0000 mg | ORAL_TABLET | Freq: Every day | ORAL | 3 refills | Status: DC

## 2016-04-05 NOTE — Progress Notes (Signed)
Chief Complaint  Patient presents with  . Follow-up    12 mth      History of Present Illness: 82 yo WM with history of CAD, HTN, HLD here today for cardiac follow up. He underwent catheterization in October 2011 because of chest pain and was found to have a severe stenosis in a large posterolateral branch of the right coronary artery and a severe stenosis in the proximal LAD with complete occlusion of the distal LAD. Dr. Olevia Perches placed drug eluting stents in the posterior lateral branch of the right coronary and the mid LAD in October 2011. Exercise stress test 10/04/11 with no evidence of ischemia.   He is here for cardiac follow up. He is very active and has no exertional chest pain or pressure. He does have leg cramps when he wakes up. He is still having knee pain and is still thinking about knee replacement surgery. He has occasional LE edema.   Primary Care Physician: [name], MD   Past Medical History:  Diagnosis Date  . BPH (benign prostatic hypertrophy)   . CAD (coronary artery disease) 01/2010   S/P drug eluting stents LAD and PL branch of RCA Dr. Olevia Perches  . Hernia   . HLD (hyperlipidemia)   . HTN (hypertension)     Past Surgical History:  Procedure Laterality Date  . HERNIA REPAIR  01/08/11   [facility]    Current Outpatient Prescriptions  Medication Sig Dispense Refill  . aspirin 81 MG tablet Take 81 mg by mouth daily.      [name] atenolol (TENORMIN) 25 MG tablet Take 1 tablet (25 mg total) by mouth daily. 90 tablet 3  . brimonidine (ALPHAGAN) 0.2 % ophthalmic solution Place 1 drop into the right eye 2 (two) times daily.      . cholecalciferol (VITAMIN D) 1000 UNITS tablet Take 1,000 Units by mouth daily.      . finasteride (PROSCAR) 5 MG tablet Take 5 mg by mouth daily.      . hydrochlorothiazide (HYDRODIURIL) 25 MG tablet Take 1 tablet (25 mg total) by mouth daily. 90 tablet 3  . isosorbide dinitrate (ISORDIL) 5 MG tablet Take 1 tablet (5 mg total) by mouth 2 (two)  times daily. 180 tablet 3  . lisinopril (PRINIVIL,ZESTRIL) 10 MG tablet Take 1 tablet (10 mg total) by mouth daily. 90 tablet 3  . Naproxen Sodium (ALEVE PO) Take 1 tablet by mouth daily as needed (knee pain). Pt. Not sure of dose    . nitroGLYCERIN (NITROSTAT) 0.4 MG SL tablet Place 1 tablet (0.4 mg total) under the tongue every 5 (five) minutes as needed for chest pain. 25 tablet 6  . pravastatin (PRAVACHOL) 40 MG tablet Take 1 tablet (40 mg total) by mouth at bedtime. 90 tablet 3  . timolol (TIMOPTIC) 0.5 % ophthalmic solution Place 1 drop into the right eye daily.       No current facility-administered medications for this visit.     No Known Allergies  Social History   Social History  . Marital status: Married    Spouse name: N/A  . Number of children: N/A  . Years of education: N/A   Occupational History  . Not on file.   Social History Main Topics  . Smoking status: Never Smoker  . Smokeless tobacco: Never Used  . Alcohol use No  . Drug use: No  . Sexual activity: Not on file   Other Topics Concern  . Not on file   Social  History Narrative   Married    Family History  Problem Relation Age of Onset  . Heart failure Mother     Review of Systems:  As stated in the HPI and otherwise negative.   BP 120/60   Pulse 64   Ht 5\' 10"  (1.778 m)   Wt 180 lb 6.4 oz (81.8 kg)   BMI 25.88 kg/m   Physical Examination: General: Well developed, well nourished, NAD  HEENT: OP clear, mucus membranes moist  SKIN: warm, dry. No rashes. Neuro: No focal deficits  Musculoskeletal: Muscle strength 5/5 all ext  Psychiatric: Mood and affect normal  Neck: No JVD, no carotid bruits, no thyromegaly, no lymphadenopathy.  Lungs:Clear bilaterally, no wheezes, rhonci, crackles Cardiovascular: Regular rate and rhythm. No murmurs, gallops or rubs. Abdomen:Soft. Bowel sounds present. Non-tender.  Extremities: No lower extremity edema. Pulses are 2 + in the bilateral DP/PT.  EKG:  EKG  is ordered today. The ekg ordered today demonstrates NSR, rate 64 bpm.  Recent Labs: No results found for requested labs within last 8760 hours.   Lipid Panel No results found for: CHOL, TRIG, HDL, CHOLHDL, VLDL, LDLCALC, LDLDIRECT   Wt Readings from Last 3 Encounters:  04/05/16 180 lb 6.4 oz (81.8 kg)  03/30/15 176 lb (79.8 kg)  07/19/13 171 lb 6.4 oz (77.7 kg)     Other studies Reviewed: Additional studies/ records that were reviewed today include:  Review of the above records demonstrates:    Assessment and Plan:   1. CAD: Stable.  Continue ASA, beta blocker, Imdur, Ace-inh. No changes today. He was a non-responder to Plavix.  2. HTN: BP controlled. No changes today.   3. Hyperlipidemia: Will continue statin. Lipids followed in primary care.    Current medicines are reviewed at length with the patient today.  The patient does not have concerns regarding medicines.  The following changes have been made:  no change  Labs/ tests ordered today include:   Orders Placed This Encounter  Procedures  . EKG 12-Lead    Disposition:   FU with me in 12 months  Signed, Lauree Chandler, MD 04/05/2016 3:11 PM    [facility] [place], [place], [place]  [address] Phone: [phone]; Fax: [phone]

## 2016-04-05 NOTE — Patient Instructions (Signed)

## 2016-05-14 DIAGNOSIS — M5030 Other cervical disc degeneration, unspecified cervical region: Secondary | ICD-10-CM | POA: Insufficient documentation

## 2017-02-26 DIAGNOSIS — Z8639 Personal history of other endocrine, nutritional and metabolic disease: Secondary | ICD-10-CM | POA: Insufficient documentation

## 2017-03-12 DIAGNOSIS — I10 Essential (primary) hypertension: Secondary | ICD-10-CM | POA: Insufficient documentation

## 2017-03-12 DIAGNOSIS — E785 Hyperlipidemia, unspecified: Secondary | ICD-10-CM | POA: Insufficient documentation

## 2017-04-02 ENCOUNTER — Encounter: Payer: Self-pay | Admitting: Cardiovascular Disease

## 2017-04-02 ENCOUNTER — Ambulatory Visit: Payer: Medicare Other | Admitting: Cardiovascular Disease

## 2017-04-02 VITALS — BP 110/60 | HR 82 | Ht 70.0 in | Wt 178.8 lb

## 2017-04-02 DIAGNOSIS — I10 Essential (primary) hypertension: Secondary | ICD-10-CM

## 2017-04-02 DIAGNOSIS — I2510 Atherosclerotic heart disease of native coronary artery without angina pectoris: Secondary | ICD-10-CM

## 2017-04-02 DIAGNOSIS — E7800 Pure hypercholesterolemia, unspecified: Secondary | ICD-10-CM

## 2017-04-02 MED ORDER — PRAVASTATIN SODIUM 40 MG PO TABS: 40.0000 mg | ORAL_TABLET | Freq: Every day | ORAL | 3 refills | Status: DC

## 2017-04-02 MED ORDER — ISOSORBIDE DINITRATE 5 MG PO TABS: 5.0000 mg | ORAL_TABLET | Freq: Two times a day (BID) | ORAL | 3 refills | Status: DC

## 2017-04-02 MED ORDER — LISINOPRIL 10 MG PO TABS: 10.0000 mg | ORAL_TABLET | Freq: Every day | ORAL | 3 refills | Status: DC

## 2017-04-02 MED ORDER — ATENOLOL 25 MG PO TABS: 25.0000 mg | ORAL_TABLET | Freq: Every day | ORAL | 3 refills | Status: DC

## 2017-04-02 MED ORDER — NITROGLYCERIN 0.4 MG SL SUBL: 0.4000 mg | SUBLINGUAL_TABLET | SUBLINGUAL | 6 refills | Status: DC | PRN

## 2017-04-02 MED ORDER — HYDROCHLOROTHIAZIDE 25 MG PO TABS: 25.0000 mg | ORAL_TABLET | Freq: Every day | ORAL | 3 refills | Status: DC

## 2017-04-02 NOTE — Patient Instructions (Signed)

## 2017-04-02 NOTE — Progress Notes (Signed)
Chief Complaint  Patient presents with  . Coronary Artery Disease     History of Present Illness: 83 yo male with history of CAD, HTN, HLD here today for cardiac follow up. He underwent catheterization in October 2011 because of chest pain and was found to have a severe stenosis in a large posterolateral branch of the right coronary artery and a severe stenosis in the proximal LAD with complete occlusion of the distal LAD. Drug eluting stents were placed in the posterior lateral branch of the right coronary and the mid LAD in October 2011. Exercise stress test 10/04/11 with no evidence of ischemia.   He is here today for follow up. The patient denies any chest pain, dyspnea, palpitations, lower extremity edema, orthopnea, PND, dizziness, near syncope or syncope. He has been cutting wood and doing well. He is mostly limited by knee pain.    Primary Care Physician: [name], MD   Past Medical History:  Diagnosis Date  . BPH (benign prostatic hypertrophy)   . CAD (coronary artery disease) 01/2010   S/P drug eluting stents LAD and PL branch of RCA Dr. Olevia Perches  . Hernia   . HLD (hyperlipidemia)   . HTN (hypertension)     Past Surgical History:  Procedure Laterality Date  . HERNIA REPAIR  01/08/11   [facility]    Current Outpatient Medications  Medication Sig Dispense Refill  . aspirin 81 MG tablet Take 81 mg by mouth daily.      [name] atenolol (TENORMIN) 25 MG tablet Take 1 tablet (25 mg total) by mouth daily. 90 tablet 3  . brimonidine (ALPHAGAN) 0.2 % ophthalmic solution Place 1 drop into the right eye 2 (two) times daily.      . cholecalciferol (VITAMIN D) 1000 UNITS tablet Take 1,000 Units by mouth daily.      . finasteride (PROSCAR) 5 MG tablet Take 5 mg by mouth daily.      . hydrochlorothiazide (HYDRODIURIL) 25 MG tablet Take 1 tablet (25 mg total) by mouth daily. 90 tablet 3  . isosorbide dinitrate (ISORDIL) 5 MG tablet Take 1 tablet (5 mg total) by mouth 2 (two) times daily.  180 tablet 3  . lisinopril (PRINIVIL,ZESTRIL) 10 MG tablet Take 1 tablet (10 mg total) by mouth daily. 90 tablet 3  . Naproxen Sodium (ALEVE PO) Take 1 tablet by mouth daily as needed (knee pain). Pt. Not sure of dose    . nitroGLYCERIN (NITROSTAT) 0.4 MG SL tablet Place 1 tablet (0.4 mg total) under the tongue every 5 (five) minutes as needed for chest pain. 25 tablet 6  . pravastatin (PRAVACHOL) 40 MG tablet Take 1 tablet (40 mg total) by mouth at bedtime. 90 tablet 3  . timolol (TIMOPTIC) 0.5 % ophthalmic solution Place 1 drop into the right eye daily.       No current facility-administered medications for this visit.     No Known Allergies  Social History   Socioeconomic History  . Marital status: Married    Spouse name: Not on file  . Number of children: Not on file  . Years of education: Not on file  . Highest education level: Not on file  Social Needs  . Financial resource strain: Not on file  . Food insecurity - worry: Not on file  . Food insecurity - inability: Not on file  . Transportation needs - medical: Not on file  . Transportation needs - non-medical: Not on file  Occupational History  . Not  on file  Tobacco Use  . Smoking status: Never Smoker  . Smokeless tobacco: Never Used  Substance and Sexual Activity  . Alcohol use: No  . Drug use: No  . Sexual activity: Not on file  Other Topics Concern  . Not on file  Social History Narrative   Married    Family History  Problem Relation Age of Onset  . Heart failure Mother     Review of Systems:  As stated in the HPI and otherwise negative.   BP 110/60   Pulse 82   Ht 5\' 10"  (1.778 m)   Wt 178 lb 12.8 oz (81.1 kg)   SpO2 98%   BMI 25.66 kg/m   Physical Examination:  General: Well developed, well nourished, NAD  HEENT: OP clear, mucus membranes moist  SKIN: warm, dry. No rashes. Neuro: No focal deficits  Musculoskeletal: Muscle strength 5/5 all ext  Psychiatric: Mood and affect normal  Neck: No  JVD, no carotid bruits, no thyromegaly, no lymphadenopathy.  Lungs:Clear bilaterally, no wheezes, rhonci, crackles Cardiovascular: Regular rate and rhythm. No murmurs, gallops or rubs. Abdomen:Soft. Bowel sounds present. Non-tender.  Extremities: No lower extremity edema. Pulses are 2 + in the bilateral DP/PT.  EKG:  EKG is ordered today. The ekg ordered today demonstrates NSR, rate 82 bpm  Recent Labs: No results found for requested labs within last 8760 hours.   Lipid Panel No results found for: CHOL, TRIG, HDL, CHOLHDL, VLDL, LDLCALC, LDLDIRECT   Wt Readings from Last 3 Encounters:  04/02/17 178 lb 12.8 oz (81.1 kg)  04/05/16 180 lb 6.4 oz (81.8 kg)  03/30/15 176 lb (79.8 kg)     Other studies Reviewed: Additional studies/ records that were reviewed today include:  Review of the above records demonstrates:    Assessment and Plan:   1. CAD without angina: He is having no chest pain suggestive of angina. Will continue ASA, statin, beta blocker, Isordil. Of note, he was a non-responder to Plavix.  2. HTN: BP is controlled. No changes today.   3. Hyperlipidemia: Lipids followed in primary care. Continue statin.   Current medicines are reviewed at length with the patient today.  The patient does not have concerns regarding medicines.  The following changes have been made:  no change  Labs/ tests ordered today include:   Orders Placed This Encounter  Procedures  . EKG 12-Lead    Disposition:   FU with me in 12 months  Signed, Lauree Chandler, MD 04/02/2017 11:51 AM    [facility] [place], [place], [place]  [address] Phone: [phone]; Fax: [phone]

## 2017-06-09 ENCOUNTER — Ambulatory Visit (INDEPENDENT_AMBULATORY_CARE_PROVIDER_SITE_OTHER): Payer: Medicare Other | Admitting: Orthopaedic Surgery

## 2017-06-09 ENCOUNTER — Encounter (INDEPENDENT_AMBULATORY_CARE_PROVIDER_SITE_OTHER): Payer: Self-pay | Admitting: Orthopaedic Surgery

## 2017-06-09 VITALS — BP 135/60 | HR 62 | Resp 18 | Ht 69.0 in | Wt 175.0 lb

## 2017-06-09 DIAGNOSIS — M1712 Unilateral primary osteoarthritis, left knee: Secondary | ICD-10-CM

## 2017-06-09 DIAGNOSIS — M170 Bilateral primary osteoarthritis of knee: Secondary | ICD-10-CM | POA: Insufficient documentation

## 2017-06-09 MED ORDER — METHYLPREDNISOLONE ACETATE 40 MG/ML IJ SUSP
80.0000 mg | INTRAMUSCULAR | Status: AC | PRN
  Administered 2017-06-09: 80 mg

## 2017-06-09 MED ORDER — LIDOCAINE HCL 1 % IJ SOLN
2.0000 mL | INTRAMUSCULAR | Status: AC | PRN
  Administered 2017-06-09: 2 mL

## 2017-06-09 MED ORDER — BUPIVACAINE HCL 0.5 % IJ SOLN
2.0000 mL | INTRAMUSCULAR | Status: AC | PRN
  Administered 2017-06-09: 2 mL via INTRA_ARTICULAR

## 2017-06-09 NOTE — Progress Notes (Signed)
Office Visit Note   Patient: Harold [name]           Date of Birth: [birth year]           MRN: [id number] Visit Date: 06/09/2017              Requested by: Harold Every, MD [address] [place] [address] PCP: Harold Every, MD   Assessment & Plan: Visit Diagnoses:  1. Bilateral primary osteoarthritis of knee     Plan: Cortisone injection left knee.  Office 2 weeks to consider injecting right knee  Follow-Up Instructions: Return in about 2 weeks (around 06/23/2017).   Orders:  Orders Placed This Encounter  Procedures  . Large Joint Inj: L knee   No orders of the defined types were placed in this encounter.     Procedures: Large Joint Inj: L knee on 06/09/2017 10:21 AM Indications: pain and diagnostic evaluation Details: 25 G 1.5 in needle, anteromedial approach  Arthrogram: No  Medications: 2 mL lidocaine 1 %; 2 mL bupivacaine 0.5 %; 80 mg methylPREDNISolone acetate 40 MG/ML Procedure, treatment alternatives, risks and benefits explained, specific risks discussed. Consent was given by the patient. Patient was prepped and draped in the usual sterile fashion.       Clinical Data: No additional findings.   Subjective: Chief Complaint  Patient presents with  . New Patient (Initial Visit)    WOULD LIKE INJECTIONS IN LEFT KNEE AS IT HURTS MORE THAN THE RIGHT SIDE  Harold [name] has been previously diagnosed with end-stage osteoarthritis of both knees by virtue of x-rays.  He has had prior arthroscopy on the left knee many years ago.  He has had cortisone with excellent recently has had some recurrence of his pain to the point of compromise.  He is having more trouble on the left than the right.  No injury or trauma.  HPI  Review of Systems  Constitutional: Negative for fatigue and fever.  HENT: Negative for ear pain.   Eyes: Negative for pain.  Respiratory: Negative for cough and shortness of breath.   Cardiovascular: Positive for leg swelling.    Gastrointestinal: Negative for blood in stool, constipation and diarrhea.  Genitourinary: Negative for difficulty urinating.  Musculoskeletal: Negative for back pain and neck pain.  Skin: Negative for rash and wound.  Allergic/Immunologic: Negative for food allergies.  Neurological: Negative for dizziness, weakness, light-headedness, numbness and headaches.  Hematological: Bruises/bleeds easily.  Psychiatric/Behavioral: Negative for sleep disturbance.     Objective: Vital Signs: BP 135/60 (BP Location: Left Arm, Patient Position: Sitting, Cuff Size: Normal)   Pulse 62   Resp 18   Ht 5\' 9"  (1.753 m)   Wt 175 lb (79.4 kg)   BMI 25.84 kg/m   Physical Exam  Ortho Exam awake alert and oriented x3.  Comfortable sitting.  Diffuse medial joint tenderness both knees.  Slight varus.  Small effusion left greater than right knee.  Full extension and flexion over 105 degrees.  No instability.  Calf pain.  Neurovascular exam intact.  Mild patellar crepitation.  No lateral joint pain either knee  Specialty Comments:  No specialty comments available.  Imaging: No results found.   PMFS History: Patient Active Problem List   Diagnosis Date Noted  . Bilateral primary osteoarthritis of knee 06/09/2017  . HTN (hypertension)   . HLD (hyperlipidemia)   . CAD, NATIVE VESSEL 01/29/2010  . MUSCLE PAIN 01/29/2010  . CAD (coronary artery disease) 01/16/2010  .  HYPERTENSION, BENIGN ESSENTIAL 01/08/2010  . MYOCARDIAL PERFUSION SCAN, WITH STRESS TEST, ABNORMAL 01/08/2010   Past Medical History:  Diagnosis Date  . BPH (benign prostatic hypertrophy)   . CAD (coronary artery disease) 01/2010   S/P drug eluting stents LAD and PL branch of RCA Dr. Olevia Perches  . Hernia   . HLD (hyperlipidemia)   . HTN (hypertension)     Family History  Problem Relation Age of Onset  . Heart failure Mother     Past Surgical History:  Procedure Laterality Date  . BACK SURGERY    . HERNIA REPAIR  01/08/11   [facility]    Social History   Occupational History  . Not on file  Tobacco Use  . Smoking status: Never Smoker  . Smokeless tobacco: Never Used  Substance and Sexual Activity  . Alcohol use: No  . Drug use: No  . Sexual activity: Not on file

## 2017-06-23 ENCOUNTER — Ambulatory Visit (INDEPENDENT_AMBULATORY_CARE_PROVIDER_SITE_OTHER): Payer: Medicare Other | Admitting: Orthopaedic Surgery

## 2017-06-23 ENCOUNTER — Encounter (INDEPENDENT_AMBULATORY_CARE_PROVIDER_SITE_OTHER): Payer: Self-pay | Admitting: Orthopaedic Surgery

## 2017-06-23 VITALS — BP 110/51 | HR 58 | Resp 16 | Ht 70.0 in | Wt 178.0 lb

## 2017-06-23 DIAGNOSIS — M170 Bilateral primary osteoarthritis of knee: Secondary | ICD-10-CM

## 2017-06-23 MED ORDER — METHYLPREDNISOLONE ACETATE 40 MG/ML IJ SUSP
80.0000 mg | INTRAMUSCULAR | Status: AC | PRN
  Administered 2017-06-23: 80 mg

## 2017-06-23 MED ORDER — LIDOCAINE HCL 1 % IJ SOLN
2.0000 mL | INTRAMUSCULAR | Status: AC | PRN
  Administered 2017-06-23: 2 mL

## 2017-06-23 MED ORDER — BUPIVACAINE HCL 0.5 % IJ SOLN
2.0000 mL | INTRAMUSCULAR | Status: AC | PRN
  Administered 2017-06-23: 2 mL via INTRA_ARTICULAR

## 2017-06-23 NOTE — Progress Notes (Signed)
Office Visit Note   Patient: Harold [name]           Date of Birth: [birth year]           MRN: [id number] Visit Date: 06/23/2017              Requested by: [name], MD [address], [place] [address] PCP: [name], MD   Assessment & Plan: Visit Diagnoses:  1. Bilateral primary osteoarthritis of knee     Plan: Cortisone injection right knee.  Did well with cortisone injection left knee.  Return as needed  Follow-Up Instructions: Return if symptoms worsen or fail to improve.   Orders:  No orders of the defined types were placed in this encounter.  No orders of the defined types were placed in this encounter.     Procedures: Large Joint Inj: R knee on 06/23/2017 9:37 AM Indications: pain and diagnostic evaluation Details: 25 G 1.5 in needle, anteromedial approach  Arthrogram: No  Medications: 2 mL lidocaine 1 %; 2 mL bupivacaine 0.5 %; 80 mg methylPREDNISolone acetate 40 MG/ML Procedure, treatment alternatives, risks and benefits explained, specific risks discussed. Consent was given by the patient. Immediately prior to procedure a time out was called to verify the correct patient, procedure, equipment, support staff and site/side marked as required. Patient was prepped and draped in the usual sterile fashion.       Clinical Data: No additional findings.   Subjective: Chief Complaint  Patient presents with  . Right Knee - Pain  . Follow-up    R KNEE INJECTION REQUEST  Harold [name] was seen for evaluation of bilateral knee osteoarthritis several weeks ago.  Injected the more symptomatic left knee with cortisone and he did well.  He would like to have a cortisone injection of his right knee today  HPI  Review of Systems  Constitutional: Negative for fatigue.  HENT: Negative for ear pain.   Eyes: Negative for pain.  Respiratory: Negative for cough and shortness of breath.   Cardiovascular: Negative for leg swelling.    Gastrointestinal: Negative for constipation and diarrhea.  Genitourinary: Negative for difficulty urinating.  Musculoskeletal: Negative for back pain and neck pain.  Skin: Negative for rash.  Allergic/Immunologic: Negative for food allergies.  Neurological: Negative for weakness, light-headedness, numbness and headaches.  Hematological: Does not bruise/bleed easily.  Psychiatric/Behavioral: Negative for sleep disturbance.     Objective: Vital Signs: BP (!) 110/51 (BP Location: Left Arm, Patient Position: Sitting, Cuff Size: Normal)   Pulse (!) 58   Resp 16   Ht 5\' 10"  (1.778 m)   Wt 178 lb (80.7 kg)   BMI 25.54 kg/m   Physical Exam  Ortho Exam right knee with minimal effusion.  Increased varus with weightbearing.  Lacks a few degrees to full extension to over 100 degrees without instability.  More medial than lateral joint pain.  Some patellar crepitation.  Neurovascular exam intact distally  Specialty Comments:  No specialty comments available.  Imaging: No results found.   PMFS History: Patient Active Problem List   Diagnosis Date Noted  . Bilateral primary osteoarthritis of knee 06/09/2017  . HTN (hypertension)   . HLD (hyperlipidemia)   . CAD, NATIVE VESSEL 01/29/2010  . MUSCLE PAIN 01/29/2010  . CAD (coronary artery disease) 01/16/2010  . HYPERTENSION, BENIGN ESSENTIAL 01/08/2010  . MYOCARDIAL PERFUSION SCAN, WITH STRESS TEST, ABNORMAL 01/08/2010   Past Medical History:  Diagnosis Date  . BPH (benign prostatic hypertrophy)   .  CAD (coronary artery disease) 01/2010   S/P drug eluting stents LAD and PL branch of RCA Dr. Olevia Perches  . Hernia   . HLD (hyperlipidemia)   . HTN (hypertension)     Family History  Problem Relation Age of Onset  . Heart failure Mother     Past Surgical History:  Procedure Laterality Date  . BACK SURGERY    . HERNIA REPAIR  01/08/11   [facility]   Social History   Occupational History  . Not on file  Tobacco Use  . Smoking status:  Never Smoker  . Smokeless tobacco: Never Used  Substance and Sexual Activity  . Alcohol use: No  . Drug use: No  . Sexual activity: Not on file

## 2017-07-30 ENCOUNTER — Ambulatory Visit: Payer: Self-pay | Admitting: Otolaryngology

## 2017-07-30 NOTE — H&P (Signed)
PREOPERATIVE H&P  Chief Complaint: enlarged right posterior neck node  HPI: Harold [name] is a 83 y.o. male who presents for evaluation of an enlarged right posterior neck node. He has been treated previously and the [facility] with excision of basal cell carcinoma, squamous cell carcinoma and melanoma from his back. I recent visit he was noted to have an enlarged right posterior neck node. On exam in the office he has a firm right posterior suboccipital node measuring approximately 16 mm in size.He is taken to the operating room for excisional biopsy of the node.  Past Medical History:  Diagnosis Date  . BPH (benign prostatic hypertrophy)   . CAD (coronary artery disease) 01/2010   S/P drug eluting stents LAD and PL branch of RCA Dr. Olevia Perches  . Hernia   . HLD (hyperlipidemia)   . HTN (hypertension)    Past Surgical History:  Procedure Laterality Date  . BACK SURGERY    . HERNIA REPAIR  01/08/11   RIH   Social History   Socioeconomic History  . Marital status: Married    Spouse name: Not on file  . Number of children: Not on file  . Years of education: Not on file  . Highest education level: Not on file  Occupational History  . Not on file  Social Needs  . Financial resource strain: Not on file  . Food insecurity:    Worry: Not on file    Inability: Not on file  . Transportation needs:    Medical: Not on file    Non-medical: Not on file  Tobacco Use  . Smoking status: Never Smoker  . Smokeless tobacco: Never Used  Substance and Sexual Activity  . Alcohol use: No  . Drug use: No  . Sexual activity: Not on file  Lifestyle  . Physical activity:    Days per week: Not on file    Minutes per session: Not on file  . Stress: Not on file  Relationships  . Social connections:    Talks on phone: Not on file    Gets together: Not on file    Attends religious service: Not on file    Active member of club or organization: Not on file    Attends meetings of clubs or  organizations: Not on file    Relationship status: Not on file  Other Topics Concern  . Not on file  Social History Narrative   Married   Family History  Problem Relation Age of Onset  . Heart failure Mother    No Known Allergies Prior to Admission medications   Medication Sig Start Date End Date Taking? Authorizing Provider  aspirin 81 MG tablet Take 81 mg by mouth daily.      [provider]  atenolol (TENORMIN) 25 MG tablet Take 1 tablet (25 mg total) by mouth daily. 04/02/17   [name], MD  brimonidine (ALPHAGAN) 0.2 % ophthalmic solution Place 1 drop into the right eye 2 (two) times daily.      [provider]  cholecalciferol (VITAMIN D) 1000 UNITS tablet Take 1,000 Units by mouth daily.      [provider]  finasteride (PROSCAR) 5 MG tablet Take 5 mg by mouth daily.      [provider]  hydrochlorothiazide (HYDRODIURIL) 25 MG tablet Take 1 tablet (25 mg total) by mouth daily. 04/02/17   [name], MD  isosorbide dinitrate (ISORDIL) 5 MG tablet Take 1 tablet (5 mg total)  by mouth 2 (two) times daily. 04/02/17   [name], MD  lisinopril (PRINIVIL,ZESTRIL) 10 MG tablet Take 1 tablet (10 mg total) by mouth daily. 04/02/17   [name], MD  Naproxen Sodium (ALEVE PO) Take 1 tablet by mouth daily as needed (knee pain). Pt. Not sure of dose    [provider]  nitroGLYCERIN (NITROSTAT) 0.4 MG SL tablet Place 1 tablet (0.4 mg total) under the tongue every 5 (five) minutes as needed for chest pain. 04/02/17 07/21/18  [name], MD  pravastatin (PRAVACHOL) 40 MG tablet Take 1 tablet (40 mg total) by mouth at bedtime. 04/02/17   [name], MD  timolol (TIMOPTIC) 0.5 % ophthalmic solution Place 1 drop into the right eye daily.      [provider]     Positive ROS: negative  All other systems have been reviewed and were otherwise negative with the exception of  those mentioned in the HPI and as above.  Physical Exam: There were no vitals filed for this visit.  General: Alert, no acute distress Oral: Normal oral mucosa and tonsils Nasal: Clear nasal passages Neck: right posterior neck suboccipital lymph node approximately 1 1/2 cm in size. Another posterior triangle node on the right side approximately 1 cm size. Ear: Ear canal is clear with normal appearing TMs Cardiovascular: Regular rate and rhythm, no murmur.  Respiratory: Clear to auscultation Neurologic: Alert and oriented x 3   Assessment/Plan: ENLARGED LYMPH NODES Plan for Procedure(s): EXCISION RIGHT NECK LYMPH NODE   [name], MD 07/30/2017 2:08 PM

## 2017-07-31 ENCOUNTER — Encounter (HOSPITAL_BASED_OUTPATIENT_CLINIC_OR_DEPARTMENT_OTHER): Payer: Self-pay | Admitting: *Deleted

## 2017-07-31 ENCOUNTER — Other Ambulatory Visit: Payer: Self-pay

## 2017-08-04 ENCOUNTER — Encounter (HOSPITAL_BASED_OUTPATIENT_CLINIC_OR_DEPARTMENT_OTHER)
Admission: RE | Admit: 2017-08-04 | Discharge: 2017-08-04 | Disposition: A | Discharge status: Home / Self Care | Payer: Medicare Other | Source: Ambulatory Visit | Attending: Otolaryngology | Admitting: Otolaryngology

## 2017-08-04 DIAGNOSIS — Z7982 Long term (current) use of aspirin: Secondary | ICD-10-CM | POA: Diagnosis not present

## 2017-08-04 DIAGNOSIS — Z79899 Other long term (current) drug therapy: Secondary | ICD-10-CM | POA: Diagnosis not present

## 2017-08-04 DIAGNOSIS — N400 Enlarged prostate without lower urinary tract symptoms: Secondary | ICD-10-CM | POA: Diagnosis not present

## 2017-08-04 DIAGNOSIS — E785 Hyperlipidemia, unspecified: Secondary | ICD-10-CM | POA: Diagnosis not present

## 2017-08-04 DIAGNOSIS — I10 Essential (primary) hypertension: Secondary | ICD-10-CM | POA: Diagnosis not present

## 2017-08-04 DIAGNOSIS — I2510 Atherosclerotic heart disease of native coronary artery without angina pectoris: Secondary | ICD-10-CM | POA: Diagnosis not present

## 2017-08-04 DIAGNOSIS — C8211 Follicular lymphoma grade II, lymph nodes of head, face, and neck: Secondary | ICD-10-CM | POA: Diagnosis not present

## 2017-08-04 LAB — BASIC METABOLIC PANEL
Anion gap: 9 (ref 5–15)
BUN: 21 mg/dL — ABNORMAL HIGH (ref 6–20)
CO2: 29 mmol/L (ref 22–32)
Calcium: 9 mg/dL (ref 8.9–10.3)
Chloride: 99 mmol/L — ABNORMAL LOW (ref 101–111)
Creatinine, Ser: 0.95 mg/dL (ref 0.61–1.24)
GFR calc Af Amer: >60 mL/min (ref >60)
GFR calc non Af Amer: >60 mL/min (ref >60)
Glucose, Bld: 102 mg/dL — ABNORMAL HIGH (ref 65–99)
Potassium: 4.1 mmol/L (ref 3.5–5.1)
Sodium: 137 mmol/L (ref 135–145)

## 2017-08-05 ENCOUNTER — Encounter (HOSPITAL_BASED_OUTPATIENT_CLINIC_OR_DEPARTMENT_OTHER): Payer: Self-pay | Admitting: Anesthesiology

## 2017-08-05 ENCOUNTER — Ambulatory Visit (HOSPITAL_BASED_OUTPATIENT_CLINIC_OR_DEPARTMENT_OTHER)
Admission: RE | Admit: 2017-08-05 | Discharge: 2017-08-05 | Disposition: A | Discharge status: Home / Self Care | Payer: Medicare Other | Source: Ambulatory Visit | Attending: Otolaryngology | Admitting: Otolaryngology

## 2017-08-05 ENCOUNTER — Encounter (HOSPITAL_BASED_OUTPATIENT_CLINIC_OR_DEPARTMENT_OTHER)
Admission: RE | Disposition: A | Discharge status: Home / Self Care | Payer: Self-pay | Source: Ambulatory Visit | Attending: Otolaryngology

## 2017-08-05 ENCOUNTER — Encounter (HOSPITAL_BASED_OUTPATIENT_CLINIC_OR_DEPARTMENT_OTHER): Payer: Self-pay

## 2017-08-05 DIAGNOSIS — I2510 Atherosclerotic heart disease of native coronary artery without angina pectoris: Secondary | ICD-10-CM | POA: Insufficient documentation

## 2017-08-05 DIAGNOSIS — C8211 Follicular lymphoma grade II, lymph nodes of head, face, and neck: Secondary | ICD-10-CM | POA: Insufficient documentation

## 2017-08-05 DIAGNOSIS — N400 Enlarged prostate without lower urinary tract symptoms: Secondary | ICD-10-CM | POA: Insufficient documentation

## 2017-08-05 DIAGNOSIS — I10 Essential (primary) hypertension: Secondary | ICD-10-CM | POA: Insufficient documentation

## 2017-08-05 DIAGNOSIS — E785 Hyperlipidemia, unspecified: Secondary | ICD-10-CM | POA: Insufficient documentation

## 2017-08-05 DIAGNOSIS — Z79899 Other long term (current) drug therapy: Secondary | ICD-10-CM | POA: Insufficient documentation

## 2017-08-05 DIAGNOSIS — Z7982 Long term (current) use of aspirin: Secondary | ICD-10-CM | POA: Insufficient documentation

## 2017-08-05 HISTORY — PX: MASS EXCISION: SHX2000

## 2017-08-05 SURGERY — MINOR EXCISION OF MASS
Anesthesia: LOCAL | Site: Neck | Laterality: Right

## 2017-08-05 MED ORDER — LIDOCAINE-EPINEPHRINE 1 %-1:100000 IJ SOLN
INTRAMUSCULAR | Status: AC
  Filled 2017-08-05: qty 1

## 2017-08-05 MED ORDER — BACITRACIN ZINC 500 UNIT/GM EX OINT
TOPICAL_OINTMENT | CUTANEOUS | Status: AC
  Filled 2017-08-05: qty 1.8

## 2017-08-05 MED ORDER — MIDAZOLAM HCL 2 MG/2ML IJ SOLN: 1.0000 mg | INTRAMUSCULAR | Status: DC | PRN

## 2017-08-05 MED ORDER — SCOPOLAMINE 1 MG/3DAYS TD PT72: 1.0000 | MEDICATED_PATCH | Freq: Once | TRANSDERMAL | Status: DC | PRN

## 2017-08-05 MED ORDER — LIDOCAINE-EPINEPHRINE 1 %-1:100000 IJ SOLN
INTRAMUSCULAR | Status: DC | PRN
  Administered 2017-08-05: 6 mL

## 2017-08-05 MED ORDER — FENTANYL CITRATE (PF) 100 MCG/2ML IJ SOLN: 50.0000 ug | INTRAMUSCULAR | Status: DC | PRN

## 2017-08-05 MED ORDER — CHLORHEXIDINE GLUCONATE CLOTH 2 % EX PADS: 6.0000 | MEDICATED_PAD | Freq: Once | CUTANEOUS | Status: DC

## 2017-08-05 SURGICAL SUPPLY — 60 items
BANDAGE ADH SHEER 1  50/CT (GAUZE/BANDAGES/DRESSINGS) IMPLANT
BENZOIN TINCTURE PRP APPL 2/3 (GAUZE/BANDAGES/DRESSINGS) IMPLANT
BLADE CLIPPER SURG (BLADE) ×2 IMPLANT
BLADE SURG 15 STRL LF DISP TIS (BLADE) ×1 IMPLANT
BLADE SURG 15 STRL SS (BLADE) ×1
CANISTER SUCT 1200ML W/VALVE (MISCELLANEOUS) ×2 IMPLANT
CAUTERY EYE LOW TEMP 1300F FIN (OPHTHALMIC RELATED) IMPLANT
CLEANER CAUTERY TIP 5X5 PAD (MISCELLANEOUS) IMPLANT
CORD BIPOLAR FORCEPS 12FT (ELECTRODE) ×2 IMPLANT
COTTONBALL LRG STERILE PKG (GAUZE/BANDAGES/DRESSINGS) IMPLANT
DECANTER SPIKE VIAL GLASS SM (MISCELLANEOUS) ×2 IMPLANT
DERMABOND ADVANCED (GAUZE/BANDAGES/DRESSINGS) ×1
DERMABOND ADVANCED .7 DNX12 (GAUZE/BANDAGES/DRESSINGS) ×1 IMPLANT
DRAPE IMP U-DRAPE 54X76 (DRAPES) ×2 IMPLANT
DRSG TEGADERM 4X4.75 (GAUZE/BANDAGES/DRESSINGS) IMPLANT
ELECT COATED BLADE 2.86 ST (ELECTRODE) ×2 IMPLANT
ELECT REM PT RETURN 9FT ADLT (ELECTROSURGICAL) ×2
ELECTRODE REM PT RTRN 9FT ADLT (ELECTROSURGICAL) ×1 IMPLANT
GAUZE SPONGE 4X4 12PLY STRL LF (GAUZE/BANDAGES/DRESSINGS) IMPLANT
GAUZE SPONGE 4X4 16PLY XRAY LF (GAUZE/BANDAGES/DRESSINGS) IMPLANT
GLOVE BIOGEL PI IND STRL 7.0 (GLOVE) ×1 IMPLANT
GLOVE BIOGEL PI INDICATOR 7.0 (GLOVE) ×1
GLOVE ECLIPSE 6.5 STRL STRAW (GLOVE) ×2 IMPLANT
GLOVE SS BIOGEL STRL SZ 7.5 (GLOVE) ×1 IMPLANT
GLOVE SUPERSENSE BIOGEL SZ 7.5 (GLOVE) ×1
GOWN STRL REUS W/ TWL LRG LVL3 (GOWN DISPOSABLE) ×1 IMPLANT
GOWN STRL REUS W/ TWL XL LVL3 (GOWN DISPOSABLE) ×1 IMPLANT
GOWN STRL REUS W/TWL LRG LVL3 (GOWN DISPOSABLE) ×1
GOWN STRL REUS W/TWL XL LVL3 (GOWN DISPOSABLE) ×1
MARKER SKIN DUAL TIP RULER LAB (MISCELLANEOUS) IMPLANT
NEEDLE PRECISIONGLIDE 27X1.5 (NEEDLE) ×2 IMPLANT
NS IRRIG 1000ML POUR BTL (IV SOLUTION) ×2 IMPLANT
PACK BASIN DAY SURGERY FS (CUSTOM PROCEDURE TRAY) ×2 IMPLANT
PAD CLEANER CAUTERY TIP 5X5 (MISCELLANEOUS)
PENCIL BUTTON HOLSTER BLD 10FT (ELECTRODE) ×2 IMPLANT
SPONGE INTESTINAL PEANUT (DISPOSABLE) IMPLANT
STRIP CLOSURE SKIN 1/2X4 (GAUZE/BANDAGES/DRESSINGS) IMPLANT
STRIP CLOSURE SKIN 1/4X4 (GAUZE/BANDAGES/DRESSINGS) IMPLANT
SUCTION FRAZIER HANDLE 10FR (MISCELLANEOUS)
SUCTION TUBE FRAZIER 10FR DISP (MISCELLANEOUS) IMPLANT
SUT CHROMIC 3 0 PS 2 (SUTURE) ×2 IMPLANT
SUT CHROMIC 4 0 P 3 18 (SUTURE) IMPLANT
SUT ETHILON 5 0 P 3 18 (SUTURE)
SUT ETHILON 6 0 P 1 (SUTURE) IMPLANT
SUT NYLON ETHILON 5-0 P-3 1X18 (SUTURE) IMPLANT
SUT PLAIN 5 0 P 3 18 (SUTURE) IMPLANT
SUT SILK 4 0 TIES 17X18 (SUTURE) IMPLANT
SUT VIC AB 4-0 P-3 18XBRD (SUTURE) IMPLANT
SUT VIC AB 4-0 P3 18 (SUTURE)
SUT VIC AB 5-0 P-3 18X BRD (SUTURE) IMPLANT
SUT VIC AB 5-0 P3 18 (SUTURE)
SWAB COLLECTION DEVICE MRSA (MISCELLANEOUS) IMPLANT
SWAB CULTURE ESWAB REG 1ML (MISCELLANEOUS) IMPLANT
SWABSTICK POVIDONE IODINE SNGL (MISCELLANEOUS) ×4 IMPLANT
SYR BULB 3OZ (MISCELLANEOUS) ×2 IMPLANT
SYR CONTROL 10ML LL (SYRINGE) ×2 IMPLANT
TOWEL OR 17X24 6PK STRL BLUE (TOWEL DISPOSABLE) ×2 IMPLANT
TRAY DSU PREP LF (CUSTOM PROCEDURE TRAY) IMPLANT
TUBE CONNECTING 20X1/4 (TUBING) ×2 IMPLANT
YANKAUER SUCT BULB TIP NO VENT (SUCTIONS) IMPLANT

## 2017-08-05 NOTE — Interval H&P Note (Signed)
History and Physical Interval Note:  08/05/2017 8:26 AM  Harold [name]  has presented today for surgery, with the diagnosis of ENLARGED LYMPH NODES  The various methods of treatment have been discussed with the patient and family. After consideration of risks, benefits and other options for treatment, the patient has consented to  Procedure(s): EXCISION RIGHT NECK LYMPH NODE (Right) as a surgical intervention .  The patient's history has been reviewed, patient examined, no change in status, stable for surgery.  I have reviewed the patient's chart and labs.  Questions were answered to the patient's satisfaction.     [name]

## 2017-08-05 NOTE — Brief Op Note (Signed)
08/05/2017  9:32 AM  PATIENT:  Florene Route Sawaya  83 y.o. male  PRE-OPERATIVE DIAGNOSIS:  ENLARGED LYMPH NODES  POST-OPERATIVE DIAGNOSIS:  ENLARGED LYMPH NODES  PROCEDURE:  Procedure(s): EXCISION RIGHT POSTERIOR  NECK LYMPH NODE (Right)  SURGEON:  Surgeon(s) and Role:    Rozetta Nunnery, MD - Primary  PHYSICIAN ASSISTANT:   ASSISTANTS: none   ANESTHESIA:   local  EBL:  minimal  BLOOD ADMINISTERED:none  DRAINS: none   LOCAL MEDICATIONS USED:  XYLOCAINE with EPI 6 cc  SPECIMEN:  Source of Specimen:  Right posterior neck node (suboccipital node)  DISPOSITION OF SPECIMEN:  PATHOLOGY  COUNTS:  YES  TOURNIQUET:  * No tourniquets in log *  DICTATION: .Other Dictation: Dictation Number [id number]  PLAN OF CARE: Discharge to home after PACU  PATIENT DISPOSITION:  PACU - hemodynamically stable.   Delay start of Pharmacological VTE agent (>24hrs) due to surgical blood loss or risk of bleeding: not applicable

## 2017-08-05 NOTE — Op Note (Signed)
NAME: ENDY, EASTERLY MEDICAL RECORD [id number] ACCOUNT [id number] DATE OF BIRTH:1934-12-14 FACILITY: [facility] LOCATION: [facility] PHYSICIAN:CHRISTOPHER Lincoln Maxin, MD  OPERATIVE REPORT  DATE OF PROCEDURE:  08/05/2017  PREOPERATIVE DIAGNOSIS:  Right posterior neck lymphadenopathy.  POSTOPERATIVE DIAGNOSIS:  Right posterior neck lymphadenopathy.  OPERATION PERFORMED:  Excisional biopsy of right posterior neck occipital node.  SURGEON:  [name], MD  ANESTHESIA:  Via local 1% Xylocaine with epinephrine, 6 mL.  COMPLICATIONS:  None.  BRIEF CLINICAL NOTE:  The patient is an 83 year old gentleman who has had previous history of several skin cancers including squamous cell, basal cell and melanoma.  He apparently has noticed an enlarged node in the right posterior occipital area for  several months.  He was referred to me from the [facility] for excisional biopsy.  On exam, patient has an approximate 1.5 to 2 cm posterior right neck node just below the hairline.  He also has a separate posterior neck node along the  accessory chain of lymph nodes that is smaller, but still about a centimeter in size and firm.  He is taken to the operating room at this time for excisional biopsy of the larger posterior suboccipital node on the right side.  DESCRIPTION OF PROCEDURE:  The patient was brought to the operating room and placed in a sitting position with his neck extended over a table.  The area was prepped with Betadine solution and draped out with sterile towels.  The proposed incision site  was marked and injected with 5-6 mL of Xylocaine with epinephrine for local anesthetic.  A horizontal incision was made directly over the large node.  Dissection was carried down through subcutaneous tissue.  The nodule was deep lying adjacent to the  deep musculature of the posterior neck and was slightly adherent to the musculature.  The node was entirely excised including some of the  fascia of the muscle.  Hemostasis was then obtained with bipolar cautery.  Wound was irrigated.  After obtaining  adequate hemostasis, the incision site was closed with 3-0 chromic sutures subcutaneously and Dermabond to reapproximate skin edges.    The patient tolerated this well.  Specimen was sent to pathology in saline for lymphoma workup as well as other neoplastic workup.  The patient is discharged home later on Tylenol and ibuprofen for pain.  AN/NUANCE  D:08/05/2017 T:08/05/2017 JOB:[id number]

## 2017-08-05 NOTE — Discharge Instructions (Signed)
Keep incision site dry for 1 day Tylenol, or ibuprofen prn pain Call office on Thursday between 4:30 and 5 concerning results of path and make follow up appt in 7-9 days     # [phone]

## 2017-08-06 ENCOUNTER — Encounter (HOSPITAL_BASED_OUTPATIENT_CLINIC_OR_DEPARTMENT_OTHER): Payer: Self-pay | Admitting: Otolaryngology

## 2017-08-13 ENCOUNTER — Telehealth: Payer: Self-pay | Admitting: Hematology

## 2017-08-13 ENCOUNTER — Encounter: Payer: Self-pay | Admitting: Hematology

## 2017-08-13 NOTE — Telephone Encounter (Signed)
Received a call from Dr. Radene Journey at [facility] to schedule the pt for an oncology appt. Appt has been scheduled for the pt to see Dr. Irene Limbo on 6/6 at 11am.   Dr. [name] asked that I call the pt with the appt information. I cld and the pt's wife picked up the phone. The pt wasn't home at the time. I gave the pt my name and telephone number to have her husband call me back.

## 2017-08-13 NOTE — Telephone Encounter (Signed)
Duplicate encounter

## 2017-08-14 ENCOUNTER — Telehealth: Payer: Self-pay | Admitting: Hematology

## 2017-08-14 NOTE — Telephone Encounter (Signed)
Attempted to call the pt back to make aware of appt that has been scheduled for him to see Dr. Irene Limbo on 6/6 at 11am. Phone line was busy.

## 2017-08-19 NOTE — Progress Notes (Signed)
HEMATOLOGY/ONCOLOGY CONSULTATION NOTE  Date of Service: 08/19/2017  Patient Care Team: [name], MD as PCP - General (Family Medicine) [name], MD as PCP - Cardiology (Cardiology)  CHIEF COMPLAINTS/PURPOSE OF CONSULTATION:  Follicular Lymphoma  HISTORY OF PRESENTING ILLNESS:   Harold [name] is a wonderful 83 y.o. male who has been referred to [place] by Harold [name] for evaluation and management of Follicular Lymphoma. He is accompanied today by his sister. The pt reports that he is doing well overall.   The pt reports first noticing the rt neck mass develop in his neck about a year ago and saw his PCP Harold [name].  The mass was getting larger and so he was referred to Harold [name] for further evaluation and had a biopsy of the rt upper neck mass which showed Grade 1-2 follicular lymphoma. pt has had Tissue flow cytometry completed on 08/05/17 with results revealing A LAMBDA-RESTRICTED MONOCLONAL B-CELL POPULATION EXPRESSING CD10 COMPRISES 39% OF ALL LYMPHOCYTES.  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 He denies feeling any differently now than he did a year ago. He takes daily 81mg  aspirin which he notes has led to some mild increased bruising.   Of note prior to the patient's visit today,  He notes that he has had a couple spots of melanoma on his chin and back about 5 and 8 years ago. These were subsequently removed. He has had a basal cell as well and has been treated several times with liquid nitrogen.   On review of systems, pt reports stable energy levels, some bruising, stable bowel habits, and denies fevers, chills, night sweats, unexpected weight loss, pain along the spine, abdominal pains, testicular pain or swelling, and any other symptoms.   On PMHx the pt reports melanoma, CAD, HTN, HLD,  cataract in left eye.   MEDICAL HISTORY:  Past Medical History:  Diagnosis Date  . BPH (benign prostatic hypertrophy)   . CAD (coronary artery disease) 01/2010   S/P drug eluting stents LAD and PL branch of RCA Harold. Olevia Perches  . Hernia   . HLD (hyperlipidemia)   . HTN (hypertension)     SURGICAL HISTORY: Past Surgical History:  Procedure Laterality Date  . BACK SURGERY    . HERNIA REPAIR  01/08/11   RIH  . MASS EXCISION Right 08/05/2017   Procedure: EXCISION RIGHT POSTERIOR  NECK LYMPH NODE;  Surgeon: Rozetta Nunnery, MD;  Location: [place];  Service: ENT;  Laterality: Right;    SOCIAL HISTORY: Social History   Socioeconomic History  . Marital status: Married    Spouse name: Not on file  . Number of children: Not on file  . Years of education: Not on file  . Highest education level: Not on file  Occupational History  . Not on file  Social Needs  . Financial resource strain: Not on file  . Food insecurity:    Worry: Not on file    Inability: Not on file  . Transportation needs:    Medical: Not on file    Non-medical: Not on file  Tobacco Use  . Smoking status: Never Smoker  . Smokeless tobacco: Never Used  Substance and Sexual Activity  . Alcohol use: No  . Drug use: No  . Sexual activity: Not on file  Lifestyle  .  Physical activity:    Days per week: Not on file    Minutes per session: Not on file  . Stress: Not on file  Relationships  . Social connections:    Talks on phone: Not on file    Gets together: Not on file    Attends religious service: Not on file    Active member of club or organization: Not on file    Attends meetings of clubs or organizations: Not on file    Relationship status: Not on file  . Intimate partner violence:    Fear of current or ex partner: Not on file    Emotionally abused: Not on file    Physically abused: Not on file    Forced sexual activity: Not on file  Other Topics Concern  . Not on file  Social  History Narrative   Married    FAMILY HISTORY: Family History  Problem Relation Age of Onset  . Heart failure Mother     ALLERGIES:  has No Known Allergies.  MEDICATIONS:  Current Outpatient Medications  Medication Sig Dispense Refill  . aspirin 81 MG tablet Take 81 mg by mouth daily.      [name] atenolol (TENORMIN) 25 MG tablet Take 1 tablet (25 mg total) by mouth daily. 90 tablet 3  . brimonidine (ALPHAGAN) 0.2 % ophthalmic solution Place 1 drop into the right eye 2 (two) times daily.      . cholecalciferol (VITAMIN D) 1000 UNITS tablet Take 1,000 Units by mouth daily.      . finasteride (PROSCAR) 5 MG tablet Take 5 mg by mouth daily.      . hydrochlorothiazide (HYDRODIURIL) 25 MG tablet Take 1 tablet (25 mg total) by mouth daily. 90 tablet 3  . isosorbide dinitrate (ISORDIL) 5 MG tablet Take 1 tablet (5 mg total) by mouth 2 (two) times daily. 180 tablet 3  . lisinopril (PRINIVIL,ZESTRIL) 10 MG tablet Take 1 tablet (10 mg total) by mouth daily. 90 tablet 3  . nitroGLYCERIN (NITROSTAT) 0.4 MG SL tablet Place 1 tablet (0.4 mg total) under the tongue every 5 (five) minutes as needed for chest pain. 25 tablet 6  . pravastatin (PRAVACHOL) 40 MG tablet Take 1 tablet (40 mg total) by mouth at bedtime. 90 tablet 3  . timolol (TIMOPTIC) 0.5 % ophthalmic solution Place 1 drop into the right eye daily.       No current facility-administered medications for this visit.     REVIEW OF SYSTEMS:    10 Point review of Systems was done is negative except as noted above.  PHYSICAL EXAMINATION: ECOG PERFORMANCE STATUS: 1 - Symptomatic but completely ambulatory  . Vitals:   08/21/17 1219  BP: (!) 154/61  Pulse: 67  Resp: 18  Temp: 98 F (36.7 C)  SpO2: 100%   Filed Weights   08/21/17 1219  Weight: 173 lb 14.4 oz (78.9 kg)   .Body mass index is 24.95 kg/m.  GENERAL:alert, in no acute distress and comfortable SKIN: no acute rashes, no significant lesions EYES: conjunctiva are pink and  non-injected, sclera anicteric OROPHARYNX: MMM, no exudates, no oropharyngeal erythema or ulceration NECK: supple, no JVD LYMPH:  Minimally palpable LNadenopathy in right cervical LN, no palpable lymphadenopathy in the axillary or inguinal regions LUNGS: clear to auscultation b/l with normal respiratory effort HEART: regular rate & rhythm ABDOMEN:  normoactive bowel sounds , non tender, not distended. No hepatosplenomegaly Extremity: no pedal edema PSYCH: alert & oriented x 3 with fluent speech NEURO:  no focal motor/sensory deficits  LABORATORY DATA:  I have reviewed the data as listed  . CBC Latest Ref Rng & Units 08/21/2017 01/01/2011 01/10/2010  WBC 4.0 - 10.3 K/uL 7.1 8.7 7.2  Hemoglobin 13.0 - 17.1 g/dL 13.1 12.7(L) 13.0  Hematocrit 38.4 - 49.9 % 39.7 37.9(L) 39.4  Platelets 140 - 400 K/uL 227 160 186    . CMP Latest Ref Rng & Units 08/21/2017 08/04/2017 01/01/2011  Glucose 70 - 140 mg/dL 94 102(H) 109(H)  BUN 7 - 26 mg/dL 25 21(H) 16  Creatinine 0.70 - 1.30 mg/dL 0.91 0.95 0.68  Sodium 136 - 145 mmol/L 140 137 139  Potassium 3.5 - 5.1 mmol/L 3.7 4.1 4.1  Chloride 98 - 109 mmol/L 102 99(L) 100  CO2 22 - 29 mmol/L 30(H) 29 32  Calcium 8.4 - 10.4 mg/dL 10.0 9.0 9.5  Total Protein 6.4 - 8.3 g/dL 7.3 - -  Total Bilirubin 0.2 - 1.2 mg/dL 1.1 - -  Alkaline Phos 40 - 150 U/L 61 - -  AST 5 - 34 U/L 16 - -  ALT 0 - 55 U/L 8 - -   08/08/17 Pathology:   08/05/17 Pathology:   RADIOGRAPHIC STUDIES: I have personally reviewed the radiological images as listed and agreed with the findings in the report. No results found.  ASSESSMENT & PLAN:   83 y.o. male with  1. 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  2. H/o Melanoma on chin  3. H/o head and neck Basal cell carcinoma PLAN -Continue routine skin checks with dermatologist    Labs today PET/CT in 1 week RTC with Harold [name] in 2 weeks   All of the patients questions were answered with apparent satisfaction. The patient knows to call the clinic with any problems, questions or concerns.  The total time spent in the appointment was 60 minutes and more than 50% was on counseling and direct patient cares.   Sullivan Lone MD MS AAHIVMS [facility] [address] [facility] Hematology/Oncology Physician [facility]  (Office):       [phone] (Work cell):  [phone] (Fax):           [phone]  08/19/2017 3:39 PM  I, Baldwin [place], am acting as a Education administrator for Harold [name].   .I have reviewed the above documentation for accuracy and completeness, and I agree with the above. Brunetta Genera MD MS

## 2017-08-21 ENCOUNTER — Inpatient Hospital Stay: Payer: Medicare Other

## 2017-08-21 ENCOUNTER — Telehealth: Payer: Self-pay

## 2017-08-21 ENCOUNTER — Inpatient Hospital Stay: Payer: Medicare Other | Attending: Hematology | Admitting: Hematology

## 2017-08-21 ENCOUNTER — Encounter: Payer: Self-pay | Admitting: Hematology

## 2017-08-21 ENCOUNTER — Other Ambulatory Visit: Payer: Self-pay

## 2017-08-21 VITALS — BP 154/61 | HR 67 | Temp 98.0°F | Resp 18 | Ht 70.0 in | Wt 173.9 lb

## 2017-08-21 DIAGNOSIS — Z85820 Personal history of malignant melanoma of skin: Secondary | ICD-10-CM

## 2017-08-21 DIAGNOSIS — C8221 Follicular lymphoma grade III, unspecified, lymph nodes of head, face, and neck: Secondary | ICD-10-CM | POA: Insufficient documentation

## 2017-08-21 DIAGNOSIS — C8291 Follicular lymphoma, unspecified, lymph nodes of head, face, and neck: Secondary | ICD-10-CM

## 2017-08-21 LAB — CMP (CANCER CENTER ONLY)
ALT: 8 U/L (ref 0–55)
AST: 16 U/L (ref 5–34)
Albumin: 4.4 g/dL (ref 3.5–5.0)
Alkaline Phosphatase: 61 U/L (ref 40–150)
Anion gap: 8 (ref 3–11)
BUN: 25 mg/dL (ref 7–26)
CO2: 30 mmol/L — ABNORMAL HIGH (ref 22–29)
Calcium: 10 mg/dL (ref 8.4–10.4)
Chloride: 102 mmol/L (ref 98–109)
Creatinine: 0.91 mg/dL (ref 0.70–1.30)
GFR, Est AFR Am: >60 mL/min (ref >60)
GFR, Estimated: >60 mL/min (ref >60)
Glucose, Bld: 94 mg/dL (ref 70–140)
Potassium: 3.7 mmol/L (ref 3.5–5.1)
Sodium: 140 mmol/L (ref 136–145)
Total Bilirubin: 1.1 mg/dL (ref 0.2–1.2)
Total Protein: 7.3 g/dL (ref 6.4–8.3)

## 2017-08-21 LAB — CBC WITH DIFFERENTIAL/PLATELET
Basophils Absolute: 0 10*3/uL (ref 0.0–0.1)
Basophils Relative: 1 %
Eosinophils Absolute: 0.1 10*3/uL (ref 0.0–0.5)
Eosinophils Relative: 2 %
HCT: 39.7 % (ref 38.4–49.9)
Hemoglobin: 13.1 g/dL (ref 13.0–17.1)
Lymphocytes Relative: 26 %
Lymphs Abs: 1.9 10*3/uL (ref 0.9–3.3)
MCH: 29.2 pg (ref 27.2–33.4)
MCHC: 32.9 g/dL (ref 32.0–36.0)
MCV: 88.6 fL (ref 79.3–98.0)
Monocytes Absolute: 0.6 10*3/uL (ref 0.1–0.9)
Monocytes Relative: 9 %
Neutro Abs: 4.5 10*3/uL (ref 1.5–6.5)
Neutrophils Relative %: 62 %
Platelets: 227 10*3/uL (ref 140–400)
RBC: 4.48 MIL/uL (ref 4.20–5.82)
RDW: 14.2 % (ref 11.0–14.6)
WBC: 7.1 10*3/uL (ref 4.0–10.3)

## 2017-08-21 LAB — LACTATE DEHYDROGENASE: LDH: 175 U/L (ref 125–245)

## 2017-08-21 NOTE — Telephone Encounter (Signed)
Printed avs and calender of upcoming appointment. Per 6/6 los 

## 2017-08-22 LAB — HEPATITIS C ANTIBODY: HCV Ab: <0.1 s/co ratio (ref 0.0–0.9)

## 2017-08-22 LAB — HEPATITIS B SURFACE ANTIGEN: Hepatitis B Surface Ag: NEGATIVE

## 2017-08-22 LAB — HEPATITIS B CORE ANTIBODY, TOTAL: Hep B Core Total Ab: POSITIVE — AB

## 2017-09-02 ENCOUNTER — Encounter (HOSPITAL_COMMUNITY)
Admission: RE | Admit: 2017-09-02 | Discharge: 2017-09-02 | Disposition: A | Discharge status: Home / Self Care | Payer: Medicare Other | Source: Ambulatory Visit | Attending: Hematology | Admitting: Hematology

## 2017-09-02 DIAGNOSIS — C8291 Follicular lymphoma, unspecified, lymph nodes of head, face, and neck: Secondary | ICD-10-CM | POA: Insufficient documentation

## 2017-09-02 LAB — GLUCOSE, CAPILLARY: Glucose-Capillary: 104 mg/dL — ABNORMAL HIGH (ref 65–99)

## 2017-09-02 MED ORDER — FLUDEOXYGLUCOSE F - 18 (FDG) INJECTION
8.6000 | Freq: Once | INTRAVENOUS | Status: AC | PRN
  Administered 2017-09-02: 8.6 via INTRAVENOUS

## 2017-09-04 ENCOUNTER — Ambulatory Visit: Payer: Medicare Other

## 2017-09-04 ENCOUNTER — Inpatient Hospital Stay: Payer: Medicare Other | Admitting: Hematology

## 2017-09-09 NOTE — Progress Notes (Signed)
HEMATOLOGY/ONCOLOGY CONSULTATION NOTE  Date of Service: 09/10/2017  Patient Care Team: [name], MD as PCP - General (Family Medicine) [name], MD as PCP - Cardiology (Cardiology)  CHIEF COMPLAINTS/PURPOSE OF CONSULTATION:  Follicular Lymphoma  HISTORY OF PRESENTING ILLNESS:   Harold [name] is a wonderful 83 y.o. male who has been referred to [place] by Dr [name] for evaluation and management of Follicular Lymphoma. He is accompanied today by his sister. The pt reports that he is doing well overall.   The pt reports first noticing the rt neck mass develop in his neck about a year ago and saw his PCP Dr [name].  The mass was getting larger and so he was referred to Dr [name] for further evaluation and had a biopsy of the rt upper neck mass which showed Grade 1-2 follicular lymphoma. pt has had Tissue flow cytometry completed on 08/05/17 with results revealing A LAMBDA-RESTRICTED MONOCLONAL B-CELL POPULATION EXPRESSING CD10 COMPRISES 39% OF ALL LYMPHOCYTES.  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 He denies feeling any differently now than he did a year ago. He takes daily 81mg  aspirin which he notes has led to some mild increased bruising.   Of note prior to the patient's visit today,  He notes that he has had a couple spots of melanoma on his chin and back about 5 and 8 years ago. These were subsequently removed. He has had a basal cell as well and has been treated several times with liquid nitrogen.   On review of systems, pt reports stable energy levels, some bruising, stable bowel habits, and denies fevers, chills, night sweats, unexpected weight loss, pain along the spine, abdominal pains, testicular pain or swelling, and any other symptoms.   On PMHx the pt reports melanoma, CAD, HTN, HLD,  cataract in left eye.  Interval History:   [name] returns today regarding his follicular lymphoma. The patient's last visit with [place] was on 08/21/17. He is accompanied today by his sister. The pt reports that he is doing well overall. His PCP is Dr [name].   The pt reports that his only current worry is that his knees are "wore out." He also notes that he believes his statin medication has worsened his knee arthritis and has stopped taking his statin.   He denies any constitutional symptoms or noticing any new lumps or bumps. He notes that he gets tired easily but that this has been stable for several years and he attributes this entirely to his age.   The pt adds that he eats well and eats healthy.   Of note since the patient's last visit, pt has had PET/CT completed on 09/02/17 with results revealing Hypermetabolic lymphadenopathy involving the right neck, bilateral axillary, right hilar and right inguinal lymph nodes. 2. No abdominal/pelvic involvement. No definite osseous involvement. 3. 4 mm distal left ureteral calculus without obstructive findings. There are also 2 bladder calculi. 4. Changes consistent with chronic calcific pancreatitis.  Lab results (08/21/17) of CBC, CMP, and Reticulocytes is as follows: all values are WNL except for CO2 at 30. Hep B core antibody positive 08/21/17 Hep B surface antigen negative 08/21/17 LDH 08/21/17 is normal at 175 HCV antibody 08/21/17 is normal at <0.1  On review of systems, pt reports stable fatigue, stable knee pain, and denies night  sweats, fevers, chills, abdominal pains, leg swelling, and any other symptoms.    MEDICAL HISTORY:  Past Medical History:  Diagnosis Date  . BPH (benign prostatic hypertrophy)   . CAD (coronary artery disease) 01/2010   S/P drug eluting stents LAD and PL branch of RCA Dr. Olevia Perches  . Hernia   . HLD (hyperlipidemia)   . HTN (hypertension)     SURGICAL HISTORY: Past Surgical History:  Procedure  Laterality Date  . BACK SURGERY    . HERNIA REPAIR  01/08/11   RIH  . MASS EXCISION Right 08/05/2017   Procedure: EXCISION RIGHT POSTERIOR  NECK LYMPH NODE;  Surgeon: Rozetta Nunnery, MD;  Location: [facility];  Service: ENT;  Laterality: Right;    SOCIAL HISTORY: Social History   Socioeconomic History  . Marital status: Married    Spouse name: Not on file  . Number of children: Not on file  . Years of education: Not on file  . Highest education level: Not on file  Occupational History  . Not on file  Social Needs  . Financial resource strain: Not on file  . Food insecurity:    Worry: Not on file    Inability: Not on file  . Transportation needs:    Medical: Not on file    Non-medical: Not on file  Tobacco Use  . Smoking status: Never Smoker  . Smokeless tobacco: Never Used  Substance and Sexual Activity  . Alcohol use: No  . Drug use: No  . Sexual activity: Not on file  Lifestyle  . Physical activity:    Days per week: Not on file    Minutes per session: Not on file  . Stress: Not on file  Relationships  . Social connections:    Talks on phone: Not on file    Gets together: Not on file    Attends religious service: Not on file    Active member of club or organization: Not on file    Attends meetings of clubs or organizations: Not on file    Relationship status: Not on file  . Intimate partner violence:    Fear of current or ex partner: Not on file    Emotionally abused: Not on file    Physically abused: Not on file    Forced sexual activity: Not on file  Other Topics Concern  . Not on file  Social History Narrative   Married    FAMILY HISTORY: Family History  Problem Relation Age of Onset  . Heart failure Mother     ALLERGIES:  has No Known Allergies.  MEDICATIONS:  Current Outpatient Medications  Medication Sig Dispense Refill  . aspirin 81 MG tablet Take 81 mg by mouth daily.      [name] atenolol (TENORMIN) 25 MG tablet Take 1  tablet (25 mg total) by mouth daily. 90 tablet 3  . brimonidine (ALPHAGAN) 0.2 % ophthalmic solution Place 1 drop into the right eye 2 (two) times daily.      . cholecalciferol (VITAMIN D) 1000 UNITS tablet Take 1,000 Units by mouth daily.      . finasteride (PROSCAR) 5 MG tablet Take 5 mg by mouth daily.      . hydrochlorothiazide (HYDRODIURIL) 25 MG tablet Take 1 tablet (25 mg total) by mouth daily. 90 tablet 3  . isosorbide dinitrate (ISORDIL) 5 MG tablet Take 1 tablet (5 mg total) by mouth 2 (two) times daily. 180 tablet 3  . lisinopril (PRINIVIL,ZESTRIL) 10 MG  tablet Take 1 tablet (10 mg total) by mouth daily. 90 tablet 3  . nitroGLYCERIN (NITROSTAT) 0.4 MG SL tablet Place 1 tablet (0.4 mg total) under the tongue every 5 (five) minutes as needed for chest pain. (Patient not taking: Reported on 08/21/2017) 25 tablet 6  . pravastatin (PRAVACHOL) 40 MG tablet Take 1 tablet (40 mg total) by mouth at bedtime. 90 tablet 3  . timolol (TIMOPTIC) 0.5 % ophthalmic solution Place 1 drop into the right eye daily.       No current facility-administered medications for this visit.     REVIEW OF SYSTEMS:    A 10+ POINT REVIEW OF SYSTEMS WAS OBTAINED including neurology, dermatology, psychiatry, cardiac, respiratory, lymph, extremities, GI, GU, Musculoskeletal, constitutional, breasts, reproductive, HEENT.  All pertinent positives are noted in the HPI.  All others are negative.   PHYSICAL EXAMINATION: ECOG PERFORMANCE STATUS: 1 - Symptomatic but completely ambulatory  Vitals:   09/10/17 0918  BP: (!) 120/58  Pulse: 63  Resp: 18  Temp: 98.1 F (36.7 C)  SpO2: 100%   Filed Weights   09/10/17 0918  Weight: 172 lb 11.2 oz (78.3 kg)   .Body mass index is 24.78 kg/m.  GENERAL:alert, in no acute distress and comfortable SKIN: no acute rashes, no significant lesions EYES: conjunctiva are pink and non-injected, sclera anicteric OROPHARYNX: MMM, no exudates, no oropharyngeal erythema or  ulceration NECK: supple, no JVD LYMPH:  Minimally palpable LNadenopathy in the right cervical, no palpable lymphadenopathy in the axillary or inguinal regions LUNGS: clear to auscultation b/l with normal respiratory effort HEART: regular rate & rhythm ABDOMEN:  normoactive bowel sounds , non tender, not distended. No palpable hepatosplenomegaly Extremity: no pedal edema PSYCH: alert & oriented x 3 with fluent speech NEURO: no focal motor/sensory deficits   LABORATORY DATA:  I have reviewed the data as listed  . CBC Latest Ref Rng & Units 08/21/2017 01/01/2011 01/10/2010  WBC 4.0 - 10.3 K/uL 7.1 8.7 7.2  Hemoglobin 13.0 - 17.1 g/dL 13.1 12.7(L) 13.0  Hematocrit 38.4 - 49.9 % 39.7 37.9(L) 39.4  Platelets 140 - 400 K/uL 227 160 186    . CMP Latest Ref Rng & Units 08/21/2017 08/04/2017 01/01/2011  Glucose 70 - 140 mg/dL 94 102(H) 109(H)  BUN 7 - 26 mg/dL 25 21(H) 16  Creatinine 0.70 - 1.30 mg/dL 0.91 0.95 0.68  Sodium 136 - 145 mmol/L 140 137 139  Potassium 3.5 - 5.1 mmol/L 3.7 4.1 4.1  Chloride 98 - 109 mmol/L 102 99(L) 100  CO2 22 - 29 mmol/L 30(H) 29 32  Calcium 8.4 - 10.4 mg/dL 10.0 9.0 9.5  Total Protein 6.4 - 8.3 g/dL 7.3 - -  Total Bilirubin 0.2 - 1.2 mg/dL 1.1 - -  Alkaline Phos 40 - 150 U/L 61 - -  AST 5 - 34 U/L 16 - -  ALT 0 - 55 U/L 8 - -   08/08/17 Pathology:   08/05/17 Pathology:   RADIOGRAPHIC STUDIES: I have personally reviewed the radiological images as listed and agreed with the findings in the report. Nm Pet Image Initial (pi) Skull Base To Thigh  Result Date: 09/02/2017 CLINICAL DATA:  Initial treatment strategy for follicular lymphoma. EXAM: NUCLEAR MEDICINE PET SKULL BASE TO THIGH TECHNIQUE: 8.6 mCi F-18 FDG was injected intravenously. Full-ring PET imaging was performed from the skull base to thigh after the radiotracer. CT data was obtained and used for attenuation correction and anatomic localization. Fasting blood glucose: 104 mg/dl COMPARISON:  None.  FINDINGS: Mediastinal blood pool activity: SUV max 2.61 NECK: Several small hypermetabolic right sided neck nodes are present. 5 mm right-sided level 2 B lymph node on image number 29 has an SUV max of 5.42. 7.5 mm level 2 B lymph node on image number 38 has an SUV max of 12.69. Two small level 5A lymph nodes are hypermetabolic. The slightly larger and more posterior node measuring 3.5 mm on image 26 has an SUV max of 4.6. Incidental CT findings: none CHEST: Bilateral axillary lymphadenopathy is hypermetabolic and consistent with lymphoma. Index node in the right axilla on image number 73 measures 17 mm and SUV max is 21.64. Index node in the left lower axilla on image number 79 measures 10 mm and SUV max is 11.47. 9 mm right hilar lymph node on image number 84 has an SUV max of 4.2. No worrisome pulmonary nodules. Incidental CT findings: Three-vessel coronary artery calcifications. ABDOMEN/PELVIS: No abnormal hypermetabolic activity within the liver, pancreas, adrenal glands, or spleen. No hypermetabolic lymph nodes in the abdomen or pelvis. Right inguinal adenopathy. The largest node measures 14 mm on image number 196 and SUV max is 16.32. Incidental CT findings: Calcifications throughout the pancreas consistent with chronic calcific pancreatitis. Two left-sided bladder calculi are noted along with a 4 mm distal left ureteral calculus. No left-sided hydroureteronephrosis. SKELETON: No focal hypermetabolic activity to suggest skeletal metastasis. Incidental CT findings: none IMPRESSION: 1. Hypermetabolic lymphadenopathy involving the right neck, bilateral axillary, right hilar and right inguinal lymph nodes. 2. No abdominal/pelvic involvement. No definite osseous involvement. 3. 4 mm distal left ureteral calculus without obstructive findings. There are also 2 bladder calculi. 4. Changes consistent with chronic calcific pancreatitis. Electronically Signed   By: Marijo Sanes M.D.   On: 09/02/2017 13:55     ASSESSMENT & PLAN:   83 y.o. male with  1. Newly diagnosed atleast Stage III Follicular Lymphoma Grade 1-2 presenting with rt cervical lymphadenopathy  08/05/17 pathology results of right cervical LN revealing low grade follicular lymphoma PLAN -Discussed pt labwork from 08/21/17; blood counts are normal. LDH is normal. -Hep B core antibody positive, hep b surface antigen negative. Hep C antibody normal at <0.1 -Discussed 09/02/17 PET which revealed Hypermetabolic lymphadenopathy involving the right neck, bilateral axillary, right hilar and right inguinal lymph nodes. No abdominal or pelvic involvement.  -Discussed with pt and his sister the natural history, clinical course, staging, diagnosis, treatment options of his low grade, Stage III follicular lymphoma -BM Bx would not significantly change treatment course -Discussed the symptoms and clinical indications for beginning treatment - he does not have any acute definitive indication to start treatment at this time. -l provided supplemental information regarding his new diagnosis. -Will see pt back in 3 months, will repeat scans in 6 months -Pt will let me know if he develops any new or worrying symptoms before this, and I will see him   2. 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 3. H/o head and neck Basal cell carcinoma PLAN -Continue routine skin checks with dermatologist    RTC with Dr Irene Limbo in 3 months with labs   All of the patients questions were answered with apparent satisfaction. The patient knows to call the clinic with any problems, questions or concerns.  The toal time spent in the appt was 35 minutes and more than 50% was on counseling and direct patient cares.   Sullivan Lone MD [place] AAHIVMS [facility] Hematology/Oncology Physician [place]  (Office):  [phone] (Work cell):  [phone] (Fax):           [phone]  09/10/2017 10:05 AM  I, Baldwin [place], am acting as a Education administrator for Dr Irene Limbo.    .I have reviewed the above documentation for accuracy and completeness, and I agree with the above. Brunetta Genera MD

## 2017-09-10 ENCOUNTER — Inpatient Hospital Stay (HOSPITAL_BASED_OUTPATIENT_CLINIC_OR_DEPARTMENT_OTHER): Payer: Medicare Other | Admitting: Hematology

## 2017-09-10 VITALS — BP 120/58 | HR 63 | Temp 98.1°F | Resp 18 | Ht 70.0 in | Wt 172.7 lb

## 2017-09-10 DIAGNOSIS — C8221 Follicular lymphoma grade III, unspecified, lymph nodes of head, face, and neck: Secondary | ICD-10-CM

## 2017-09-10 DIAGNOSIS — Z85820 Personal history of malignant melanoma of skin: Secondary | ICD-10-CM

## 2017-09-10 DIAGNOSIS — C8291 Follicular lymphoma, unspecified, lymph nodes of head, face, and neck: Secondary | ICD-10-CM

## 2017-10-29 ENCOUNTER — Telehealth: Payer: Self-pay

## 2017-10-29 NOTE — Telephone Encounter (Signed)
Spoke with patient concerning upcoming appointment. Per 8/12 sch msg

## 2017-11-19 ENCOUNTER — Inpatient Hospital Stay (HOSPITAL_BASED_OUTPATIENT_CLINIC_OR_DEPARTMENT_OTHER): Payer: Medicare Other | Admitting: Hematology

## 2017-11-19 ENCOUNTER — Inpatient Hospital Stay: Payer: Medicare Other | Attending: Hematology

## 2017-11-19 ENCOUNTER — Telehealth: Payer: Self-pay | Admitting: Hematology

## 2017-11-19 VITALS — BP 133/59 | HR 65 | Temp 98.2°F | Resp 18 | Ht 70.0 in | Wt 174.1 lb

## 2017-11-19 DIAGNOSIS — I10 Essential (primary) hypertension: Secondary | ICD-10-CM | POA: Diagnosis not present

## 2017-11-19 DIAGNOSIS — Z955 Presence of coronary angioplasty implant and graft: Secondary | ICD-10-CM | POA: Insufficient documentation

## 2017-11-19 DIAGNOSIS — N400 Enlarged prostate without lower urinary tract symptoms: Secondary | ICD-10-CM | POA: Insufficient documentation

## 2017-11-19 DIAGNOSIS — C8291 Follicular lymphoma, unspecified, lymph nodes of head, face, and neck: Secondary | ICD-10-CM

## 2017-11-19 DIAGNOSIS — Z7982 Long term (current) use of aspirin: Secondary | ICD-10-CM | POA: Diagnosis not present

## 2017-11-19 DIAGNOSIS — I2510 Atherosclerotic heart disease of native coronary artery without angina pectoris: Secondary | ICD-10-CM | POA: Diagnosis not present

## 2017-11-19 DIAGNOSIS — Z79899 Other long term (current) drug therapy: Secondary | ICD-10-CM | POA: Diagnosis not present

## 2017-11-19 DIAGNOSIS — Z85820 Personal history of malignant melanoma of skin: Secondary | ICD-10-CM | POA: Insufficient documentation

## 2017-11-19 DIAGNOSIS — C8221 Follicular lymphoma grade III, unspecified, lymph nodes of head, face, and neck: Secondary | ICD-10-CM

## 2017-11-19 DIAGNOSIS — E785 Hyperlipidemia, unspecified: Secondary | ICD-10-CM | POA: Diagnosis not present

## 2017-11-19 LAB — CMP (CANCER CENTER ONLY)
ALT: 9 U/L (ref 0–44)
AST: 12 U/L — ABNORMAL LOW (ref 15–41)
Albumin: 4.1 g/dL (ref 3.5–5.0)
Alkaline Phosphatase: 71 U/L (ref 38–126)
Anion gap: 9 (ref 5–15)
BUN: 18 mg/dL (ref 8–23)
CO2: 30 mmol/L (ref 22–32)
Calcium: 10.1 mg/dL (ref 8.9–10.3)
Chloride: 101 mmol/L (ref 98–111)
Creatinine: 0.85 mg/dL (ref 0.61–1.24)
GFR, Est AFR Am: >60 mL/min (ref >60)
GFR, Estimated: >60 mL/min (ref >60)
Glucose, Bld: 100 mg/dL — ABNORMAL HIGH (ref 70–99)
Potassium: 4.2 mmol/L (ref 3.5–5.1)
Sodium: 140 mmol/L (ref 135–145)
Total Bilirubin: 0.9 mg/dL (ref 0.3–1.2)
Total Protein: 7.4 g/dL (ref 6.5–8.1)

## 2017-11-19 LAB — CBC WITH DIFFERENTIAL/PLATELET
Basophils Absolute: 0 10*3/uL (ref 0.0–0.1)
Basophils Relative: 0 %
Eosinophils Absolute: 0.1 10*3/uL (ref 0.0–0.5)
Eosinophils Relative: 1 %
HCT: 39 % (ref 38.4–49.9)
Hemoglobin: 12.7 g/dL — ABNORMAL LOW (ref 13.0–17.1)
Lymphocytes Relative: 20 %
Lymphs Abs: 1.4 10*3/uL (ref 0.9–3.3)
MCH: 28.5 pg (ref 27.2–33.4)
MCHC: 32.6 g/dL (ref 32.0–36.0)
MCV: 87.5 fL (ref 79.3–98.0)
Monocytes Absolute: 0.8 10*3/uL (ref 0.1–0.9)
Monocytes Relative: 11 %
Neutro Abs: 5 10*3/uL (ref 1.5–6.5)
Neutrophils Relative %: 68 %
Platelets: 235 10*3/uL (ref 140–400)
RBC: 4.45 MIL/uL (ref 4.20–5.82)
RDW: 13.2 % (ref 11.0–14.6)
WBC: 7.3 10*3/uL (ref 4.0–10.3)

## 2017-11-19 LAB — LACTATE DEHYDROGENASE: LDH: 164 U/L (ref 98–192)

## 2017-11-19 NOTE — Telephone Encounter (Signed)
Appts scheduled AVS/Calendar printed contrast material provided per 9/4 los

## 2017-11-19 NOTE — Progress Notes (Signed)
HEMATOLOGY/ONCOLOGY CONSULTATION NOTE  Date of Service: 11/19/2017  Patient Care Team: [name], MD as PCP - General (Family Medicine) [name], MD as PCP - Cardiology (Cardiology)  CHIEF COMPLAINTS/PURPOSE OF CONSULTATION:  Follicular Lymphoma  HISTORY OF PRESENTING ILLNESS:   CECIL [name] is a wonderful 83 y.o. male who has been referred to [place] by Dr [name] for evaluation and management of Follicular Lymphoma. He is accompanied today by his sister. The pt reports that he is doing well overall.   The pt reports first noticing the rt neck mass develop in his neck about a year ago and saw his PCP Dr [name].  The mass was getting larger and so he was referred to Dr [name] for further evaluation and had a biopsy of the rt upper neck mass which showed Grade 1-2 follicular lymphoma. pt has had Tissue flow cytometry completed on 08/05/17 with results revealing A LAMBDA-RESTRICTED MONOCLONAL B-CELL POPULATION EXPRESSING CD10 COMPRISES 39% OF ALL LYMPHOCYTES.  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 He denies feeling any differently now than he did a year ago. He takes daily 81mg  aspirin which he notes has led to some mild increased bruising.   Of note prior to the patient's visit today,  He notes that he has had a couple spots of melanoma on his chin and back about 5 and 8 years ago. These were subsequently removed. He has had a basal cell as well and has been treated several times with liquid nitrogen.   On review of systems, pt reports stable energy levels, some bruising, stable bowel habits, and denies fevers, chills, night sweats, unexpected weight loss, pain along the spine, abdominal pains, testicular pain or swelling, and any other symptoms.   On PMHx the pt reports melanoma, CAD, HTN, HLD,  cataract in left eye.  Interval History:   Harold [name] returns today for management and evaluation of his follicular lymphoma. The patient's last visit with [place] was on 09/10/17. He is accompanied today by his wife. The pt reports that he is doing well overall.   The pt reports that he went to two dentists yesterday for a tooth ache on his left side. He notes that his root is protruding out of it's socket and he is planning to see another dentist tomorrow for an extraction. He notes that he took Percocet to address his pain. He also began taking 500mg  Amoxicillin.   The pt notes that his left neck bumps have progressed in size some. He notes that the lymph node is not bothering him very much.   The pt notes that he began feeling more fatigued 6 years ago.   Lab results today (11/19/17) of CBC w/diff, CMP is as follows: all values are WNL except for HGB at 12.7, Glucose at 100, AST at 12. 11/19/17 LDH is WNL at 164 11/19/17 Hep B, DNA is pending  On review of systems, pt reports some fatigue, modestly grown neck lymph nodes, and denies fevers, chills, night sweats, abdominal pains, testicular pain or swelling, leg swelling, and any other symptoms.    MEDICAL HISTORY:  Past Medical History:  Diagnosis Date  . BPH (benign prostatic hypertrophy)   . CAD (coronary artery disease) 01/2010   S/P drug eluting stents LAD and PL branch of [facility] Dr. Olevia Perches  . Hernia   .  HLD (hyperlipidemia)   . HTN (hypertension)     SURGICAL HISTORY: Past Surgical History:  Procedure Laterality Date  . BACK SURGERY    . HERNIA REPAIR  01/08/11   RIH  . MASS EXCISION Right 08/05/2017   Procedure: EXCISION RIGHT POSTERIOR  NECK LYMPH NODE;  Surgeon: Rozetta Nunnery, MD;  Location: [facility];  Service: ENT;  Laterality: Right;    SOCIAL HISTORY: Social History   Socioeconomic History  . Marital status: Married    Spouse name: Not on file  . Number of children: Not on file  . Years of  education: Not on file  . Highest education level: Not on file  Occupational History  . Not on file  Social Needs  . Financial resource strain: Not on file  . Food insecurity:    Worry: Not on file    Inability: Not on file  . Transportation needs:    Medical: Not on file    Non-medical: Not on file  Tobacco Use  . Smoking status: Never Smoker  . Smokeless tobacco: Never Used  Substance and Sexual Activity  . Alcohol use: No  . Drug use: No  . Sexual activity: Not on file  Lifestyle  . Physical activity:    Days per week: Not on file    Minutes per session: Not on file  . Stress: Not on file  Relationships  . Social connections:    Talks on phone: Not on file    Gets together: Not on file    Attends religious service: Not on file    Active member of club or organization: Not on file    Attends meetings of clubs or organizations: Not on file    Relationship status: Not on file  . Intimate partner violence:    Fear of current or ex partner: Not on file    Emotionally abused: Not on file    Physically abused: Not on file    Forced sexual activity: Not on file  Other Topics Concern  . Not on file  Social History Narrative   Married    FAMILY HISTORY: Family History  Problem Relation Age of Onset  . Heart failure Mother     ALLERGIES:  has No Known Allergies.  MEDICATIONS:  Current Outpatient Medications  Medication Sig Dispense Refill  . aspirin 81 MG tablet Take 81 mg by mouth daily.      [name] atenolol (TENORMIN) 25 MG tablet Take 1 tablet (25 mg total) by mouth daily. 90 tablet 3  . brimonidine (ALPHAGAN) 0.2 % ophthalmic solution Place 1 drop into the right eye 2 (two) times daily.      . cholecalciferol (VITAMIN D) 1000 UNITS tablet Take 1,000 Units by mouth daily.      . finasteride (PROSCAR) 5 MG tablet Take 5 mg by mouth daily.      . hydrochlorothiazide (HYDRODIURIL) 25 MG tablet Take 1 tablet (25 mg total) by mouth daily. 90 tablet 3  . isosorbide  dinitrate (ISORDIL) 5 MG tablet Take 1 tablet (5 mg total) by mouth 2 (two) times daily. 180 tablet 3  . lisinopril (PRINIVIL,ZESTRIL) 10 MG tablet Take 1 tablet (10 mg total) by mouth daily. 90 tablet 3  . nitroGLYCERIN (NITROSTAT) 0.4 MG SL tablet Place 1 tablet (0.4 mg total) under the tongue every 5 (five) minutes as needed for chest pain. (Patient not taking: Reported on 08/21/2017) 25 tablet 6  . pravastatin (PRAVACHOL) 40 MG tablet Take 1  tablet (40 mg total) by mouth at bedtime. 90 tablet 3  . timolol (TIMOPTIC) 0.5 % ophthalmic solution Place 1 drop into the right eye daily.       No current facility-administered medications for this visit.     REVIEW OF SYSTEMS:    A 10+ POINT REVIEW OF SYSTEMS WAS OBTAINED including neurology, dermatology, psychiatry, cardiac, respiratory, lymph, extremities, GI, GU, Musculoskeletal, constitutional, breasts, reproductive, HEENT.  All pertinent positives are noted in the HPI.  All others are negative.   PHYSICAL EXAMINATION: ECOG PERFORMANCE STATUS: 1 - Symptomatic but completely ambulatory  Vitals:   11/19/17 1417  BP: (!) 133/59  Pulse: 65  Resp: 18  Temp: 98.2 F (36.8 C)  SpO2: 100%   Filed Weights   11/19/17 1417  Weight: 174 lb 1.6 oz (79 kg)   .Body mass index is 24.98 kg/m.  GENERAL:alert, in no acute distress and comfortable SKIN: no acute rashes, no significant lesions EYES: conjunctiva are pink and non-injected, sclera anicteric OROPHARYNX: MMM, no exudates, no oropharyngeal erythema or ulceration NECK: supple, no JVD LYMPH:  Minimally palpable LNadenopathy in the right cervical and left axillary, no palpable lymphadenopathy in the inguinal regions LUNGS: clear to auscultation b/l with normal respiratory effort HEART: regular rate & rhythm ABDOMEN:  normoactive bowel sounds , non tender, not distended. No palpable hepatosplenomegaly.  Extremity: no pedal edema PSYCH: alert & oriented x 3 with fluent speech NEURO: no focal  motor/sensory deficits   LABORATORY DATA:  I have reviewed the data as listed  . CBC Latest Ref Rng & Units 11/19/2017 08/21/2017 01/01/2011  WBC 4.0 - 10.3 K/uL 7.3 7.1 8.7  Hemoglobin 13.0 - 17.1 g/dL 12.7(L) 13.1 12.7(L)  Hematocrit 38.4 - 49.9 % 39.0 39.7 37.9(L)  Platelets 140 - 400 K/uL 235 227 160    . CMP Latest Ref Rng & Units 11/19/2017 08/21/2017 08/04/2017  Glucose 70 - 99 mg/dL 100(H) 94 102(H)  BUN 8 - 23 mg/dL 18 25 21(H)  Creatinine 0.61 - 1.24 mg/dL 0.85 0.91 0.95  Sodium 135 - 145 mmol/L 140 140 137  Potassium 3.5 - 5.1 mmol/L 4.2 3.7 4.1  Chloride 98 - 111 mmol/L 101 102 99(L)  CO2 22 - 32 mmol/L 30 30(H) 29  Calcium 8.9 - 10.3 mg/dL 10.1 10.0 9.0  Total Protein 6.5 - 8.1 g/dL 7.4 7.3 -  Total Bilirubin 0.3 - 1.2 mg/dL 0.9 1.1 -  Alkaline Phos 38 - 126 U/L 71 61 -  AST 15 - 41 U/L 12(L) 16 -  ALT 0 - 44 U/L 9 8 -   08/08/17 Pathology:   08/05/17 Pathology:   RADIOGRAPHIC STUDIES: I have personally reviewed the radiological images as listed and agreed with the findings in the report. No results found.  ASSESSMENT & PLAN:   83 y.o. male with  1.Recently diagnosed atleast Stage III Follicular Lymphoma Grade 1-2 presenting with rt cervical lymphadenopathy  08/05/17 pathology results of right cervical LN revealing low grade follicular lymphoma  7/78/24 PET revealed Hypermetabolic lymphadenopathy involving the right neck, bilateral axillary, right hilar and right inguinal lymph nodes. No abdominal or pelvic involvement.    PLAN -Discussed pt labwork today, 11/19/17; blood counts and chemistries are stable  -Offered to begin a short course of steroids to decrease the size of his neck lymph nodes, the pt notes that he will let me know if he wants to do this -Offered to refer the pt to our dentist Dr. Enrique Sack, the pt will let me  know if he prefers this -Hep B core antibody positive, hep b surface antigen negative. Hep C antibody normal at <0.1 -Discussed the symptoms  and clinical indications for beginning treatment - he does not have any acute definitive indication to start treatment at this time. -Pt will let me know if he develops any new or worrying symptoms. -Will see the pt back in 4 months  2. 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 3. H/o head and neck Basal cell carcinoma PLAN -Continue routine skin checks with dermatologist    CT chest/abd/pelvis in 16 weeks RTC with Dr Irene Limbo in 4 months with labs   All of the patients questions were answered with apparent satisfaction. The patient knows to call the clinic with any problems, questions or concerns.  The total time spent in the appt was 25 minutes and more than 50% was on counseling and direct patient cares.    Sullivan Lone MD MS AAHIVMS [facility] Hematology/Oncology Physician [facility]  (Office):       [phone] (Work cell):  [phone] (Fax):           [phone]  11/19/2017 3:24 PM  I, Baldwin Jamaica, am acting as a scribe for Dr. Irene Limbo  .I have reviewed the above documentation for accuracy and completeness, and I agree with the above. Brunetta Genera MD

## 2017-11-21 ENCOUNTER — Telehealth: Payer: Self-pay

## 2017-11-21 LAB — HEPATITIS B DNA, ULTRAQUANTITATIVE, PCR
HBV DNA SERPL PCR-ACNC: NOT DETECTED IU/mL
HBV DNA SERPL PCR-LOG IU: UNDETERMINED log10 IU/mL

## 2017-11-21 NOTE — Telephone Encounter (Signed)
Per 9/4 completed by Kenney Houseman

## 2017-12-10 ENCOUNTER — Ambulatory Visit: Payer: Medicare Other | Admitting: Hematology

## 2017-12-10 ENCOUNTER — Other Ambulatory Visit: Payer: Medicare Other

## 2017-12-23 ENCOUNTER — Telehealth: Payer: Self-pay | Admitting: Cardiovascular Disease

## 2017-12-23 NOTE — Telephone Encounter (Signed)
Harold [name], Can you check on him? If he has continued lower ext edema, we may need to see him in the office. Gerald Stabs

## 2017-12-23 NOTE — Telephone Encounter (Signed)
Spoke with pt today who states his feet and ankles are very swollen today and his shoes are tight. Pt and I reviewed his diet, use of compression stockings, and elevation of his legs when sitting. Pt states he has been eating poorly since being diagnosed with non-hodgkins. I advised pt to keep his sodium levels under 1700 mg, decrease fluid intake, wear compression stockings, and keep his feet elevated. Pt will call in a day or so if he has not seen any improvements.   I will forward to [facility] and Dr Angelena Form for additional review.

## 2017-12-23 NOTE — Telephone Encounter (Signed)
New Message   Pt c/o swelling: STAT is pt has developed SOB within 24 hours  1) How much weight have you gained and in what time span? haven't weighed  2) If swelling, where is the swelling located? feet  3) Are you currently taking a fluid pill? yes  4) Are you currently SOB? no  5) Do you have a log of your daily weights (if so, list)? no  6) Have you gained 3 pounds in a day or 5 pounds in a week? Not sure  7) Have you traveled recently? no

## 2017-12-24 NOTE — Telephone Encounter (Signed)
I spoke with pt. He reports swelling has improved today. He does not feel like he needs an office visit at this time.

## 2018-03-20 ENCOUNTER — Encounter (HOSPITAL_COMMUNITY): Payer: Self-pay

## 2018-03-20 ENCOUNTER — Ambulatory Visit (HOSPITAL_COMMUNITY)
Admission: RE | Admit: 2018-03-20 | Discharge: 2018-03-20 | Disposition: A | Discharge status: Home / Self Care | Payer: Medicare Other | Source: Ambulatory Visit | Attending: Hematology | Admitting: Hematology

## 2018-03-20 ENCOUNTER — Inpatient Hospital Stay: Payer: Medicare Other | Attending: Hematology

## 2018-03-20 ENCOUNTER — Other Ambulatory Visit: Payer: Medicare Other

## 2018-03-20 DIAGNOSIS — C8291 Follicular lymphoma, unspecified, lymph nodes of head, face, and neck: Secondary | ICD-10-CM | POA: Insufficient documentation

## 2018-03-20 DIAGNOSIS — Z79899 Other long term (current) drug therapy: Secondary | ICD-10-CM | POA: Insufficient documentation

## 2018-03-20 DIAGNOSIS — Z7982 Long term (current) use of aspirin: Secondary | ICD-10-CM | POA: Insufficient documentation

## 2018-03-20 DIAGNOSIS — Z85828 Personal history of other malignant neoplasm of skin: Secondary | ICD-10-CM | POA: Insufficient documentation

## 2018-03-20 DIAGNOSIS — C8201 Follicular lymphoma grade I, lymph nodes of head, face, and neck: Secondary | ICD-10-CM | POA: Diagnosis not present

## 2018-03-20 DIAGNOSIS — Z85820 Personal history of malignant melanoma of skin: Secondary | ICD-10-CM | POA: Insufficient documentation

## 2018-03-20 HISTORY — DX: Malignant (primary) neoplasm, unspecified: C80.1

## 2018-03-20 LAB — CBC WITH DIFFERENTIAL/PLATELET
Abs Immature Granulocytes: 0.02 10*3/uL (ref 0.00–0.07)
Basophils Absolute: 0 10*3/uL (ref 0.0–0.1)
Basophils Relative: 0 %
Eosinophils Absolute: 0.1 10*3/uL (ref 0.0–0.5)
Eosinophils Relative: 1 %
HCT: 39 % (ref 39.0–52.0)
Hemoglobin: 12.4 g/dL — ABNORMAL LOW (ref 13.0–17.0)
Immature Granulocytes: 0 %
Lymphocytes Relative: 19 %
Lymphs Abs: 1.5 10*3/uL (ref 0.7–4.0)
MCH: 28.2 pg (ref 26.0–34.0)
MCHC: 31.8 g/dL (ref 30.0–36.0)
MCV: 88.6 fL (ref 80.0–100.0)
Monocytes Absolute: 0.6 10*3/uL (ref 0.1–1.0)
Monocytes Relative: 8 %
Neutro Abs: 5.4 10*3/uL (ref 1.7–7.7)
Neutrophils Relative %: 72 %
Platelets: 238 10*3/uL (ref 150–400)
RBC: 4.4 MIL/uL (ref 4.22–5.81)
RDW: 12.8 % (ref 11.5–15.5)
WBC: 7.6 10*3/uL (ref 4.0–10.5)
nRBC: 0 % (ref 0.0–0.2)

## 2018-03-20 LAB — CMP (CANCER CENTER ONLY)
ALT: 8 U/L (ref 0–44)
AST: 13 U/L — ABNORMAL LOW (ref 15–41)
Albumin: 3.9 g/dL (ref 3.5–5.0)
Alkaline Phosphatase: 66 U/L (ref 38–126)
Anion gap: 9 (ref 5–15)
BUN: 23 mg/dL (ref 8–23)
CO2: 29 mmol/L (ref 22–32)
Calcium: 9.5 mg/dL (ref 8.9–10.3)
Chloride: 103 mmol/L (ref 98–111)
Creatinine: 0.88 mg/dL (ref 0.61–1.24)
GFR, Est AFR Am: >60 mL/min (ref >60)
GFR, Estimated: >60 mL/min (ref >60)
Glucose, Bld: 98 mg/dL (ref 70–99)
Potassium: 3.8 mmol/L (ref 3.5–5.1)
Sodium: 141 mmol/L (ref 135–145)
Total Bilirubin: 1 mg/dL (ref 0.3–1.2)
Total Protein: 6.8 g/dL (ref 6.5–8.1)

## 2018-03-20 LAB — LACTATE DEHYDROGENASE: LDH: 146 U/L (ref 98–192)

## 2018-03-20 MED ORDER — SODIUM CHLORIDE (PF) 0.9 % IJ SOLN
INTRAMUSCULAR | Status: AC
  Filled 2018-03-20: qty 50

## 2018-03-20 MED ORDER — IOHEXOL 300 MG/ML  SOLN
100.0000 mL | Freq: Once | INTRAMUSCULAR | Status: AC | PRN
  Administered 2018-03-20: 100 mL via INTRAVENOUS

## 2018-03-20 NOTE — Progress Notes (Signed)
HEMATOLOGY/ONCOLOGY CONSULTATION NOTE  Date of Service: 03/23/2018  Patient Care Team: [name], MD as PCP - General (Family Medicine) [name], MD as PCP - Cardiology (Cardiology)  CHIEF COMPLAINTS/PURPOSE OF CONSULTATION:  Follicular Lymphoma  HISTORY OF PRESENTING ILLNESS:   Harold [name] is a wonderful 84 y.o. male who has been referred to [place] by Dr [name] for evaluation and management of Follicular Lymphoma. He is accompanied today by his sister. The pt reports that he is doing well overall.   The pt reports first noticing the rt neck mass develop in his neck about a year ago and saw his PCP Dr [name].  The mass was getting larger and so he was referred to Dr [name] for further evaluation and had a biopsy of the rt upper neck mass which showed Grade 1-2 follicular lymphoma. pt has had Tissue flow cytometry completed on 08/05/17 with results revealing A LAMBDA-RESTRICTED MONOCLONAL B-CELL POPULATION EXPRESSING CD10 COMPRISES 39% OF ALL LYMPHOCYTES.  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 He denies feeling any differently now than he did a year ago. He takes daily 81mg  aspirin which he notes has led to some mild increased bruising.   Of note prior to the patient's visit today,  He notes that he has had a couple spots of melanoma on his chin and back about 5 and 8 years ago. These were subsequently removed. He has had a basal cell as well and has been treated several times with liquid nitrogen.   On review of systems, pt reports stable energy levels, some bruising, stable bowel habits, and denies fevers, chills, night sweats, unexpected weight loss, pain along the spine, abdominal pains, testicular pain or swelling, and any other symptoms.   On PMHx the pt reports melanoma, CAD, HTN, HLD,  cataract in left eye.  Interval History:   Harold [name] returns today for management and evaluation of his follicular lymphoma. The patient's last visit with [place] was on 11/19/17. He is accompanied today by his wife. The pt reports that he is doing well overall.   The pt reports that his right armpit enlarged lymph node has become painful in the interim. He notes that the pain has improved over the last week, but a week ago he did wake up at night due to the pain associated with his lymph node, and was able to go back to sleep. The pt notes that this pain may have begun about 2 weeks ago after he was working in his yard. The pt denies this pain coming in the way of what he intends to do. He notes that his right cervical lymph node became smaller in the interim as well.   The pt denies any fevers, chills, night sweats or unexpected weight loss. He notes that his energy levels and weight have been stable, and he has continued eating well.   Of note since the patient's last visit, pt has had a CT C/A/P completed on 03/20/18 with results revealing Progressive bilateral axillary and right inguinal lymphadenopathy, including necrotic lymph nodes in the right axilla, as above. Spleen is normal in size. 4 mm distal left ureteral calculus, unchanged. No hydronephrosis. Stable layering bladder calculi measuring up to 5 mm. Cholelithiasis, without associated inflammatory changes. Stable sequela of prior/chronic pancreatitis.  Lab results (03/20/18) of CBC w/diff  and CMP is as follows: all values are WNL except for HGB at 12.4, AST at 13. 03/20/18 LDH at 146  On review of systems, pt reports painful but improving right armpit lymph node, stable energy levels, eating well, and denies fevers, chills, night sweats, unexpected weight loss, and any other symptoms.    MEDICAL HISTORY:  Past Medical History:  Diagnosis Date  . BPH (benign prostatic hypertrophy)   . CAD (coronary artery disease) 01/2010   S/P drug  eluting stents LAD and PL branch of RCA Dr. Olevia Perches  . follicular lymphoma dx'd 06/2017  . Hernia   . HLD (hyperlipidemia)   . HTN (hypertension)     SURGICAL HISTORY: Past Surgical History:  Procedure Laterality Date  . BACK SURGERY    . HERNIA REPAIR  01/08/11   RIH  . MASS EXCISION Right 08/05/2017   Procedure: EXCISION RIGHT POSTERIOR  NECK LYMPH NODE;  Surgeon: Rozetta Nunnery, MD;  Location: [facility];  Service: ENT;  Laterality: Right;    SOCIAL HISTORY: Social History   Socioeconomic History  . Marital status: Married    Spouse name: Not on file  . Number of children: Not on file  . Years of education: Not on file  . Highest education level: Not on file  Occupational History  . Not on file  Social Needs  . Financial resource strain: Not on file  . Food insecurity:    Worry: Not on file    Inability: Not on file  . Transportation needs:    Medical: Not on file    Non-medical: Not on file  Tobacco Use  . Smoking status: Never Smoker  . Smokeless tobacco: Never Used  Substance and Sexual Activity  . Alcohol use: No  . Drug use: No  . Sexual activity: Not on file  Lifestyle  . Physical activity:    Days per week: Not on file    Minutes per session: Not on file  . Stress: Not on file  Relationships  . Social connections:    Talks on phone: Not on file    Gets together: Not on file    Attends religious service: Not on file    Active member of club or organization: Not on file    Attends meetings of clubs or organizations: Not on file    Relationship status: Not on file  . Intimate partner violence:    Fear of current or ex partner: Not on file    Emotionally abused: Not on file    Physically abused: Not on file    Forced sexual activity: Not on file  Other Topics Concern  . Not on file  Social History Narrative   Married    FAMILY HISTORY: Family History  Problem Relation Age of Onset  . Heart failure Mother     ALLERGIES:   has No Known Allergies.  MEDICATIONS:  Current Outpatient Medications  Medication Sig Dispense Refill  . aspirin 81 MG tablet Take 81 mg by mouth daily.      [name] atenolol (TENORMIN) 25 MG tablet Take 1 tablet (25 mg total) by mouth daily. 90 tablet 3  . brimonidine (ALPHAGAN) 0.2 % ophthalmic solution Place 1 drop into the right eye 2 (two) times daily.      . cholecalciferol (VITAMIN D) 1000 UNITS tablet Take 1,000 Units by mouth daily.      . finasteride (PROSCAR) 5 MG tablet Take 5 mg by mouth daily.      [name]  hydrochlorothiazide (HYDRODIURIL) 25 MG tablet Take 1 tablet (25 mg total) by mouth daily. 90 tablet 3  . isosorbide dinitrate (ISORDIL) 5 MG tablet Take 1 tablet (5 mg total) by mouth 2 (two) times daily. 180 tablet 3  . lisinopril (PRINIVIL,ZESTRIL) 10 MG tablet Take 1 tablet (10 mg total) by mouth daily. 90 tablet 3  . nitroGLYCERIN (NITROSTAT) 0.4 MG SL tablet Place 1 tablet (0.4 mg total) under the tongue every 5 (five) minutes as needed for chest pain. (Patient not taking: Reported on 08/21/2017) 25 tablet 6  . pravastatin (PRAVACHOL) 40 MG tablet Take 1 tablet (40 mg total) by mouth at bedtime. 90 tablet 3  . timolol (TIMOPTIC) 0.5 % ophthalmic solution Place 1 drop into the right eye daily.       No current facility-administered medications for this visit.     REVIEW OF SYSTEMS:    A 10+ POINT REVIEW OF SYSTEMS WAS OBTAINED including neurology, dermatology, psychiatry, cardiac, respiratory, lymph, extremities, GI, GU, Musculoskeletal, constitutional, breasts, reproductive, HEENT.  All pertinent positives are noted in the HPI.  All others are negative.    PHYSICAL EXAMINATION: ECOG PERFORMANCE STATUS: 1 - Symptomatic but completely ambulatory  Vitals:   03/23/18 1153  BP: 131/66  Pulse: 72  Resp: 18  Temp: 98 F (36.7 C)  SpO2: 100%   Filed Weights   03/23/18 1153  Weight: 174 lb 1.6 oz (79 kg)   .Body mass index is 24.98 kg/m.  GENERAL:alert, in no acute distress  and comfortable SKIN: no acute rashes, no significant lesions EYES: conjunctiva are pink and non-injected, sclera anicteric OROPHARYNX: MMM, no exudates, no oropharyngeal erythema or ulceration NECK: supple, no JVD LYMPH: Minimally palpable LNadenopathy in bilateral cervical, all less than 1cm in size. 2-2.5cm left axillary Lymph node. 2-2.5cm right axillary lymph node. No palpable lymphadenopathy in the inguinal and supraclavicular regions LUNGS: clear to auscultation b/l with normal respiratory effort HEART: regular rate & rhythm ABDOMEN:  normoactive bowel sounds , non tender, not distended. No palpable hepatosplenomegaly.  Extremity: no pedal edema PSYCH: alert & oriented x 3 with fluent speech NEURO: no focal motor/sensory deficits   LABORATORY DATA:  I have reviewed the data as listed  . CBC Latest Ref Rng & Units 03/20/2018 11/19/2017 08/21/2017  WBC 4.0 - 10.5 K/uL 7.6 7.3 7.1  Hemoglobin 13.0 - 17.0 g/dL 12.4(L) 12.7(L) 13.1  Hematocrit 39.0 - 52.0 % 39.0 39.0 39.7  Platelets 150 - 400 K/uL 238 235 227    . CMP Latest Ref Rng & Units 03/20/2018 11/19/2017 08/21/2017  Glucose 70 - 99 mg/dL 98 100(H) 94  BUN 8 - 23 mg/dL 23 18 25   Creatinine 0.61 - 1.24 mg/dL 0.88 0.85 0.91  Sodium 135 - 145 mmol/L 141 140 140  Potassium 3.5 - 5.1 mmol/L 3.8 4.2 3.7  Chloride 98 - 111 mmol/L 103 101 102  CO2 22 - 32 mmol/L 29 30 30(H)  Calcium 8.9 - 10.3 mg/dL 9.5 10.1 10.0  Total Protein 6.5 - 8.1 g/dL 6.8 7.4 7.3  Total Bilirubin 0.3 - 1.2 mg/dL 1.0 0.9 1.1  Alkaline Phos 38 - 126 U/L 66 71 61  AST 15 - 41 U/L 13(L) 12(L) 16  ALT 0 - 44 U/L 8 9 8    08/08/17 Pathology:   08/05/17 Pathology:   RADIOGRAPHIC STUDIES: I have personally reviewed the radiological images as listed and agreed with the findings in the report. Ct Chest W Contrast  Result Date: 03/20/2018 CLINICAL DATA:  Follicular  lymphoma. Bilateral axillary lymphadenopathy on exam. EXAM: CT CHEST, ABDOMEN, AND PELVIS WITH CONTRAST  TECHNIQUE: Multidetector CT imaging of the chest, abdomen and pelvis was performed following the standard protocol during bolus administration of intravenous contrast. CONTRAST:  146mL OMNIPAQUE IOHEXOL 300 MG/ML  SOLN COMPARISON:  PET-CT dated 09/02/2017. FINDINGS: CT CHEST FINDINGS Cardiovascular: Heart is normal in size.  No pericardial effusion. No evidence of thoracic aortic aneurysm. Atherosclerotic calcifications of the aortic arch. Three vessel coronary atherosclerosis. Mediastinum/Nodes: No suspicious mediastinal, hilar, or supraclavicular lymphadenopathy. Progressive bilateral axillary lymphadenopathy, including: --2.5 cm short axis necrotic right axillary node (series 2/image 25), previously 1.7 cm --9 mm short axis right subpectoral node (series 2/image 18), new --12 mm short axis lateral left subpectoral node (series 2/image 13), new --2.3 cm short axis left axillary node (series 2/image 21), previously 10 mm Visualized thyroid is unremarkable. Lungs/Pleura: Mild biapical pleural-parenchymal scarring. No focal consolidation. No suspicious pulmonary nodules. No pleural effusion or pneumothorax. Musculoskeletal: Degenerative changes of the thoracic spine. CT ABDOMEN PELVIS FINDINGS Hepatobiliary: Liver is within normal limits. Tiny layering gallstones (series 2/image 36), without associated inflammatory changes. No intrahepatic or extrahepatic ductal dilatation. Pancreas: Coarse parenchymal calcifications related to prior/chronic pancreatitis. Spleen: Spleen is normal in size. Adrenals/Urinary Tract: Adrenal glands are within normal limits. Kidneys are within normal limits.  No hydronephrosis. Stable 4 mm distal left ureteral calculus above the UVJ (series 2/image 116). Thick-walled bladder. Two layering bladder calculi measuring up to 5 mm (series 2/image 115). Stomach/Bowel: Stomach is within normal limits. No evidence of bowel obstruction. Appendix is not discretely visualized. Mild sigmoid  diverticulosis, without evidence of diverticulitis. Vascular/Lymphatic: No evidence of abdominal aortic aneurysm. Atherosclerotic calcifications of the abdominal aorta and branch vessels. Two right inguinal nodes measuring up to 2.6 cm short axis (series 2/image 124), previously 14 mm. Otherwise, no suspicious abdominopelvic lymphadenopathy. Reproductive: Prostate is grossly unremarkable. Other: No abdominopelvic ascites. Musculoskeletal: Degenerative changes of the lumbar spine. IMPRESSION: Progressive bilateral axillary and right inguinal lymphadenopathy, including necrotic lymph nodes in the right axilla, as above. Spleen is normal in size. 4 mm distal left ureteral calculus, unchanged. No hydronephrosis. Stable layering bladder calculi measuring up to 5 mm. Cholelithiasis, without associated inflammatory changes. Stable sequela of prior/chronic pancreatitis. Electronically Signed   By: Julian Hy M.D.   On: 03/20/2018 15:35   Ct Abdomen Pelvis W Contrast  Result Date: 03/20/2018 CLINICAL DATA:  Follicular lymphoma. Bilateral axillary lymphadenopathy on exam. EXAM: CT CHEST, ABDOMEN, AND PELVIS WITH CONTRAST TECHNIQUE: Multidetector CT imaging of the chest, abdomen and pelvis was performed following the standard protocol during bolus administration of intravenous contrast. CONTRAST:  153mL OMNIPAQUE IOHEXOL 300 MG/ML  SOLN COMPARISON:  PET-CT dated 09/02/2017. FINDINGS: CT CHEST FINDINGS Cardiovascular: Heart is normal in size.  No pericardial effusion. No evidence of thoracic aortic aneurysm. Atherosclerotic calcifications of the aortic arch. Three vessel coronary atherosclerosis. Mediastinum/Nodes: No suspicious mediastinal, hilar, or supraclavicular lymphadenopathy. Progressive bilateral axillary lymphadenopathy, including: --2.5 cm short axis necrotic right axillary node (series 2/image 25), previously 1.7 cm --9 mm short axis right subpectoral node (series 2/image 18), new --12 mm short axis  lateral left subpectoral node (series 2/image 13), new --2.3 cm short axis left axillary node (series 2/image 21), previously 10 mm Visualized thyroid is unremarkable. Lungs/Pleura: Mild biapical pleural-parenchymal scarring. No focal consolidation. No suspicious pulmonary nodules. No pleural effusion or pneumothorax. Musculoskeletal: Degenerative changes of the thoracic spine. CT ABDOMEN PELVIS FINDINGS Hepatobiliary: Liver is within normal limits. Tiny layering gallstones (series 2/image  36), without associated inflammatory changes. No intrahepatic or extrahepatic ductal dilatation. Pancreas: Coarse parenchymal calcifications related to prior/chronic pancreatitis. Spleen: Spleen is normal in size. Adrenals/Urinary Tract: Adrenal glands are within normal limits. Kidneys are within normal limits.  No hydronephrosis. Stable 4 mm distal left ureteral calculus above the UVJ (series 2/image 116). Thick-walled bladder. Two layering bladder calculi measuring up to 5 mm (series 2/image 115). Stomach/Bowel: Stomach is within normal limits. No evidence of bowel obstruction. Appendix is not discretely visualized. Mild sigmoid diverticulosis, without evidence of diverticulitis. Vascular/Lymphatic: No evidence of abdominal aortic aneurysm. Atherosclerotic calcifications of the abdominal aorta and branch vessels. Two right inguinal nodes measuring up to 2.6 cm short axis (series 2/image 124), previously 14 mm. Otherwise, no suspicious abdominopelvic lymphadenopathy. Reproductive: Prostate is grossly unremarkable. Other: No abdominopelvic ascites. Musculoskeletal: Degenerative changes of the lumbar spine. IMPRESSION: Progressive bilateral axillary and right inguinal lymphadenopathy, including necrotic lymph nodes in the right axilla, as above. Spleen is normal in size. 4 mm distal left ureteral calculus, unchanged. No hydronephrosis. Stable layering bladder calculi measuring up to 5 mm. Cholelithiasis, without associated  inflammatory changes. Stable sequela of prior/chronic pancreatitis. Electronically Signed   By: Julian Hy M.D.   On: 03/20/2018 15:35    ASSESSMENT & PLAN:   84 y.o. male with  1.Recently diagnosed atleast Stage III Follicular Lymphoma Grade 1-2 presenting with rt cervical lymphadenopathy  08/05/17 pathology results of right cervical LN revealing low grade follicular lymphoma  03/26/30 PET revealed Hypermetabolic lymphadenopathy involving the right neck, bilateral axillary, right hilar and right inguinal lymph nodes. No abdominal or pelvic involvement.    2. 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 3. H/o head and neck Basal cell carcinoma Continue routine skin checks with dermatologist    PLAN -Discussed pt labwork today, 03/23/18; blood counts and chemistries are stable. LDH normal at 146.  -Discussed the 03/20/18 CT C/A/P which revealed Progressive bilateral axillary and right inguinal lymphadenopathy, including necrotic lymph nodes in the right axilla, as above. Spleen is normal in size. 4 mm distal left ureteral calculus, unchanged. No hydronephrosis. Stable layering bladder calculi measuring up to 5 mm. Cholelithiasis, without associated inflammatory changes. Stable sequela of prior/chronic pancreatitis.  -Discussed again the indications to begin treatment, which include bothersome symptoms, blood count abnormalities and end-organ threat. The pt's blood counts remain unaffected, however his enlarged lymph node in the right armpit has become bothersome.  -Discussed option to do a short burst of Prednisone (for temporary symptom relief) vs starting immunotherapy(Rituxan) -Will begin 7-10 day course of Prednisone, and recommended taking this in the morning with breakfast.  -Pt will let me know if he develops bothersome symptoms in the interim and I will see him sooner  -Offered to refer the pt to our dentist Dr. Enrique Sack, the pt will let me know if he prefers this -Hep B core antibody  positive, hep b surface antigen negative. Hep C antibody normal at <0.1 -Will see the pt back in 3 months, sooner if any new concerns    RTC with Dr Irene Limbo with labs in 3 months   All of the patients questions were answered with apparent satisfaction. The patient knows to call the clinic with any problems, questions or concerns.  The total time spent in the appt was 30 minutes and more than 50% was on counseling and direct patient cares.   Sullivan Lone MD [place] [facility]IV[facility] Hematology/Oncology Physician [facility]  (Office):       [phone] (  Work cell):  [phone] (Fax):           [phone]  03/23/2018 12:39 PM  I, Baldwin Jamaica, am acting as a scribe for Dr. Sullivan Lone.   .I have reviewed the above documentation for accuracy and completeness, and I agree with the above. Brunetta Genera MD

## 2018-03-23 ENCOUNTER — Other Ambulatory Visit: Payer: Medicare Other

## 2018-03-23 ENCOUNTER — Inpatient Hospital Stay: Payer: Medicare Other | Admitting: Hematology

## 2018-03-23 ENCOUNTER — Telehealth: Payer: Self-pay | Admitting: Hematology

## 2018-03-23 VITALS — BP 131/66 | HR 72 | Temp 98.0°F | Resp 18 | Ht 70.0 in | Wt 174.1 lb

## 2018-03-23 DIAGNOSIS — C8201 Follicular lymphoma grade I, lymph nodes of head, face, and neck: Secondary | ICD-10-CM | POA: Diagnosis not present

## 2018-03-23 DIAGNOSIS — Z85820 Personal history of malignant melanoma of skin: Secondary | ICD-10-CM

## 2018-03-23 DIAGNOSIS — C8291 Follicular lymphoma, unspecified, lymph nodes of head, face, and neck: Secondary | ICD-10-CM

## 2018-03-23 DIAGNOSIS — Z7982 Long term (current) use of aspirin: Secondary | ICD-10-CM

## 2018-03-23 DIAGNOSIS — Z85828 Personal history of other malignant neoplasm of skin: Secondary | ICD-10-CM | POA: Diagnosis not present

## 2018-03-23 DIAGNOSIS — Z79899 Other long term (current) drug therapy: Secondary | ICD-10-CM

## 2018-03-23 MED ORDER — PREDNISONE 50 MG PO TABS: 50.0000 mg | ORAL_TABLET | Freq: Every day | ORAL | 0 refills | Status: DC

## 2018-03-23 NOTE — Telephone Encounter (Signed)
Printed calendar and avs. °

## 2018-04-16 ENCOUNTER — Encounter: Payer: Self-pay | Admitting: Cardiovascular Disease

## 2018-04-16 ENCOUNTER — Ambulatory Visit (HOSPITAL_COMMUNITY): Payer: Medicare Other | Attending: Cardiovascular Disease

## 2018-04-16 ENCOUNTER — Ambulatory Visit: Payer: Medicare Other | Admitting: Cardiovascular Disease

## 2018-04-16 VITALS — BP 108/61 | HR 64 | Ht 70.0 in | Wt 176.0 lb

## 2018-04-16 DIAGNOSIS — I10 Essential (primary) hypertension: Secondary | ICD-10-CM

## 2018-04-16 DIAGNOSIS — I2510 Atherosclerotic heart disease of native coronary artery without angina pectoris: Secondary | ICD-10-CM | POA: Insufficient documentation

## 2018-04-16 DIAGNOSIS — E7800 Pure hypercholesterolemia, unspecified: Secondary | ICD-10-CM

## 2018-04-16 DIAGNOSIS — I5032 Chronic diastolic (congestive) heart failure: Secondary | ICD-10-CM

## 2018-04-16 LAB — ECHOCARDIOGRAM COMPLETE
Height: 70 in
Weight: 2816 oz

## 2018-04-16 MED ORDER — PRAVASTATIN SODIUM 40 MG PO TABS: 40.0000 mg | ORAL_TABLET | Freq: Every day | ORAL | 3 refills | Status: DC

## 2018-04-16 MED ORDER — ISOSORBIDE DINITRATE 5 MG PO TABS: 5.0000 mg | ORAL_TABLET | Freq: Two times a day (BID) | ORAL | 3 refills | Status: DC

## 2018-04-16 MED ORDER — HYDROCHLOROTHIAZIDE 25 MG PO TABS: 25.0000 mg | ORAL_TABLET | Freq: Every day | ORAL | 3 refills | Status: DC

## 2018-04-16 MED ORDER — LISINOPRIL 10 MG PO TABS: 10.0000 mg | ORAL_TABLET | Freq: Every day | ORAL | 3 refills | Status: DC

## 2018-04-16 MED ORDER — ATENOLOL 25 MG PO TABS: 25.0000 mg | ORAL_TABLET | Freq: Every day | ORAL | 3 refills | Status: DC

## 2018-04-16 NOTE — Patient Instructions (Signed)
Medication Instructions:  Your physician recommends that you continue on your current medications as directed. Please refer to the Current Medication list given to you today.  If you need a refill on your cardiac medications before your next appointment, please call your pharmacy.   Lab work: None If you have labs (blood work) drawn today and your tests are completely normal, you will receive your results only by: . MyChart Message (if you have MyChart) OR . A paper copy in the mail If you have any lab test that is abnormal or we need to change your treatment, we will call you to review the results.  Testing/Procedures: Your physician has requested that you have an echocardiogram. Echocardiography is a painless test that uses sound waves to create images of your heart. It provides your doctor with information about the size and shape of your heart and how well your heart's chambers and valves are working. This procedure takes approximately one hour. There are no restrictions for this procedure.    Follow-Up: At [facility], you and your health needs are our priority.  As part of our continuing mission to provide you with exceptional heart care, we have created designated Provider Care Teams.  These Care Teams include your primary Cardiologist (physician) and Advanced Practice Providers (APPs -  Physician Assistants and Nurse Practitioners) who all work together to provide you with the care you need, when you need it. You will need a follow up appointment in 12 months.  Please call our office 2 months in advance to schedule this appointment.  You may see Christopher McAlhany, MD or one of the following Advanced Practice Providers on your designated Care Team:   Brittainy Simmons, PA-C Dayna Dunn, PA-C . Michele Lenze, PA-C  Any Other Special Instructions Will Be Listed Below (If Applicable).    

## 2018-04-16 NOTE — Progress Notes (Signed)
Chief Complaint  Patient presents with  . Follow-up    CAD   History of Present Illness: 84 yo male with history of CAD, HTN, follicular lymphoma and HLD here today for cardiac follow up. He underwent catheterization in October 2011 because of chest pain and was found to have a severe stenosis in a large posterolateral branch of the right coronary artery and a severe stenosis in the proximal LAD with complete occlusion of the distal LAD. Drug eluting stents were placed in the posterior lateral branch of the right coronary and the mid LAD in October 2011. Exercise stress test 10/04/11 with no evidence of ischemia. He was diagnosed with lymphoma in March 2019. This was found when he had a mass on his neck. This was treated with resection. He has been treated with steroids short term for pain from his right axillary nodes. He has been discussing possible chemotherapy with Dr. Irene Limbo.   He is here today for follow up. The patient denies any chest pain, dyspnea, palpitations, orthopnea, PND, dizziness, near syncope or syncope. He does note some LE edema several months back. Mostly in his ankles. He has been wearing compression stockings. NO swelling over past month.    Primary Care Physician: [name], MD  Past Medical History:  Diagnosis Date  . BPH (benign prostatic hypertrophy)   . CAD (coronary artery disease) 01/2010   S/P drug eluting stents LAD and PL branch of RCA Dr. Olevia Perches  . follicular lymphoma dx'd 06/2017  . Hernia   . HLD (hyperlipidemia)   . HTN (hypertension)     Past Surgical History:  Procedure Laterality Date  . BACK SURGERY    . HERNIA REPAIR  01/08/11   RIH  . MASS EXCISION Right 08/05/2017   Procedure: EXCISION RIGHT POSTERIOR  NECK LYMPH NODE;  Surgeon: Rozetta Nunnery, MD;  Location: [facility];  Service: ENT;  Laterality: Right;    Current Outpatient Medications  Medication Sig Dispense Refill  . aspirin 81 MG tablet Take 81 mg by mouth  daily.      [name] atenolol (TENORMIN) 25 MG tablet Take 1 tablet (25 mg total) by mouth daily. 90 tablet 3  . brimonidine (ALPHAGAN) 0.2 % ophthalmic solution Place 1 drop into the right eye 2 (two) times daily.      . cholecalciferol (VITAMIN D) 1000 UNITS tablet Take 1,000 Units by mouth daily.      . finasteride (PROSCAR) 5 MG tablet Take 5 mg by mouth daily.      . hydrochlorothiazide (HYDRODIURIL) 25 MG tablet Take 1 tablet (25 mg total) by mouth daily. 90 tablet 3  . isosorbide dinitrate (ISORDIL) 5 MG tablet Take 1 tablet (5 mg total) by mouth 2 (two) times daily. 180 tablet 3  . lisinopril (PRINIVIL,ZESTRIL) 10 MG tablet Take 1 tablet (10 mg total) by mouth daily. 90 tablet 3  . nitroGLYCERIN (NITROSTAT) 0.4 MG SL tablet Place 1 tablet (0.4 mg total) under the tongue every 5 (five) minutes as needed for chest pain. 25 tablet 6  . pravastatin (PRAVACHOL) 40 MG tablet Take 1 tablet (40 mg total) by mouth at bedtime. 90 tablet 3  . predniSONE (DELTASONE) 50 MG tablet Take 1 tablet (50 mg total) by mouth daily with breakfast. 7 tablet 0  . timolol (TIMOPTIC) 0.5 % ophthalmic solution Place 1 drop into the right eye daily.       No current facility-administered medications for this visit.  No Known Allergies  Social History   Socioeconomic History  . Marital status: Married    Spouse name: Not on file  . Number of children: Not on file  . Years of education: Not on file  . Highest education level: Not on file  Occupational History  . Not on file  Social Needs  . Financial resource strain: Not on file  . Food insecurity:    Worry: Not on file    Inability: Not on file  . Transportation needs:    Medical: Not on file    Non-medical: Not on file  Tobacco Use  . Smoking status: Never Smoker  . Smokeless tobacco: Never Used  Substance and Sexual Activity  . Alcohol use: No  . Drug use: No  . Sexual activity: Not on file  Lifestyle  . Physical activity:    Days per week: Not  on file    Minutes per session: Not on file  . Stress: Not on file  Relationships  . Social connections:    Talks on phone: Not on file    Gets together: Not on file    Attends religious service: Not on file    Active member of club or organization: Not on file    Attends meetings of clubs or organizations: Not on file    Relationship status: Not on file  . Intimate partner violence:    Fear of current or ex partner: Not on file    Emotionally abused: Not on file    Physically abused: Not on file    Forced sexual activity: Not on file  Other Topics Concern  . Not on file  Social History Narrative   Married    Family History  Problem Relation Age of Onset  . Heart failure Mother     Review of Systems:  As stated in the HPI and otherwise negative.   BP 108/61   Pulse 64   Ht 5\' 10"  (1.778 m)   Wt 176 lb (79.8 kg)   SpO2 99%   BMI 25.25 kg/m   Physical Examination:  General: Well developed, well nourished, NAD  HEENT: OP clear, mucus membranes moist  SKIN: warm, dry. No rashes. Neuro: No focal deficits  Musculoskeletal: Muscle strength 5/5 all ext  Psychiatric: Mood and affect normal  Neck: No JVD, no carotid bruits, no thyromegaly, no lymphadenopathy.  Lungs:Clear bilaterally, no wheezes, rhonci, crackles Cardiovascular: Regular rate and rhythm. No murmurs, gallops or rubs. Abdomen:Soft. Bowel sounds present. Non-tender.  Extremities: No lower extremity edema. Pulses are 2 + in the bilateral DP/PT.  EKG:  EKG is ordered today. The ekg ordered today demonstrates NSR, rate 64 bpm  Recent Labs: 03/20/2018: ALT 8; BUN 23; Creatinine 0.88; Hemoglobin 12.4; Platelets 238; Potassium 3.8; Sodium 141   Lipid Panel No results found for: CHOL, TRIG, HDL, CHOLHDL, VLDL, LDLCALC, LDLDIRECT   Wt Readings from Last 3 Encounters:  04/16/18 176 lb (79.8 kg)  03/23/18 174 lb 1.6 oz (79 kg)  11/19/17 174 lb 1.6 oz (79 kg)     Other studies Reviewed: Additional studies/  records that were reviewed today include:  Review of the above records demonstrates:    Assessment and Plan:   1. CAD without angina: No chest pain. Continue ASA, statin, nitrate and beta blocker. Of note, he was a non-responder to Plavix. Will repeat echo now given no recent assessment of LV systolic function  2. HTN: BP is well controlled. No changes  3. Hyperlipidemia: Lipids  followed in primary care. Will continue statin.   4. Chronic diastolic CHF: He has had some LE edema over the past few months. Will arrange an echo to assess LV function. No swelling today. He does not wish to use Lasix. He is on HCTZ.   Current medicines are reviewed at length with the patient today.  The patient does not have concerns regarding medicines.  The following changes have been made:  no change  Labs/ tests ordered today include:   Orders Placed This Encounter  Procedures  . EKG 12-Lead  . ECHOCARDIOGRAM COMPLETE    Disposition:   FU with me in 12 months  Signed, Lauree Chandler, MD 04/16/2018 11:48 AM    [facility] [address], [place], [place]  [address] Phone: [phone]; Fax: [phone]

## 2018-06-19 NOTE — Progress Notes (Signed)
HEMATOLOGY/ONCOLOGY CONSULTATION NOTE  Date of Service: 06/22/2018  Patient Care Team: [name], MD as PCP - General (Family Medicine) [name], MD as PCP - Cardiology (Cardiology)  CHIEF COMPLAINTS/PURPOSE OF CONSULTATION:  Follicular Lymphoma  HISTORY OF PRESENTING ILLNESS:   Harold [name] is a wonderful 84 y.o. male who has been referred to [place] by Dr [name] for evaluation and management of Follicular Lymphoma. He is accompanied today by his sister. The pt reports that he is doing well overall.   The pt reports first noticing the rt neck mass develop in his neck about a year ago and saw his PCP Dr [name].  The mass was getting larger and so he was referred to Dr [name] for further evaluation and had a biopsy of the rt upper neck mass which showed Grade 1-2 follicular lymphoma. pt has had Tissue flow cytometry completed on 08/05/17 with results revealing A LAMBDA-RESTRICTED MONOCLONAL B-CELL POPULATION EXPRESSING CD10 COMPRISES 39% OF ALL LYMPHOCYTES.  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 He denies feeling any differently now than he did a year ago. He takes daily 81mg  aspirin which he notes has led to some mild increased bruising.   Of note prior to the patient's visit today,  He notes that he has had a couple spots of melanoma on his chin and back about 5 and 8 years ago. These were subsequently removed. He has had a basal cell as well and has been treated several times with liquid nitrogen.   On review of systems, pt reports stable energy levels, some bruising, stable bowel habits, and denies fevers, chills, night sweats, unexpected weight loss, pain along the spine, abdominal pains, testicular pain or swelling, and any other symptoms.   On PMHx the pt reports melanoma, CAD, HTN, HLD,  cataract in left eye.  Interval History:   Harold [name] returns today for management and evaluation of his follicular lymphoma. The patient's last visit with [place] was on 03/23/18. The pt reports that he is doing well overall.   In the interim the pt began a short course of Prednisone for 7 days to shrink his right axillary lymph node after our last visit.  The pt reports that he has noticed a small bump near his left ear lobe which presented 3-4 weeks ago. The pt denies abdominal pains or noticing any other lumps or bumps.  The pt notes that he has not had a cough or any concerns for infection. The pt denies any changes in his appetite, unexpected weight loss, fevers, or chills. The pt denies any new skin rashes.  Lab results today (06/22/18) of CBC w/diff and CMP is as follows: all values are WNL except for HGB at 12.7, BUN at 27. 06/22/18 LDH is at 198  On review of systems, pt reports stable energy levels, strong appetite, moving his bowels well, new small bump near left ear, and denies cough, concerns for infections, changes in his appetite, fevers, chills, night sweats, skin rashes, abdominal pains, noticing any other lumps or bumps, fatigue, and any other symptoms.  MEDICAL HISTORY:  Past Medical History:  Diagnosis Date   BPH (benign prostatic hypertrophy)    CAD (coronary artery disease) 01/2010   S/P drug eluting stents LAD and PL branch of RCA Dr. Olevia Perches   follicular lymphoma dx'd 06/2017  Hernia    HLD (hyperlipidemia)    HTN (hypertension)     SURGICAL HISTORY: Past Surgical History:  Procedure Laterality Date   BACK SURGERY     HERNIA REPAIR  01/08/11   RIH   MASS EXCISION Right 08/05/2017   Procedure: EXCISION RIGHT POSTERIOR  NECK LYMPH NODE;  Surgeon: Rozetta Nunnery, MD;  Location: [facility];  Service: ENT;  Laterality: Right;    SOCIAL HISTORY: Social History   Socioeconomic History   Marital status: Married    Spouse name: Not on  file   Number of children: Not on file   Years of education: Not on file   Highest education level: Not on file  Occupational History   Not on file  Social Needs   Financial resource strain: Not on file   Food insecurity:    Worry: Not on file    Inability: Not on file   Transportation needs:    Medical: Not on file    Non-medical: Not on file  Tobacco Use   Smoking status: Never Smoker   Smokeless tobacco: Never Used  Substance and Sexual Activity   Alcohol use: No   Drug use: No   Sexual activity: Not on file  Lifestyle   Physical activity:    Days per week: Not on file    Minutes per session: Not on file   Stress: Not on file  Relationships   Social connections:    Talks on phone: Not on file    Gets together: Not on file    Attends religious service: Not on file    Active member of club or organization: Not on file    Attends meetings of clubs or organizations: Not on file    Relationship status: Not on file   Intimate partner violence:    Fear of current or ex partner: Not on file    Emotionally abused: Not on file    Physically abused: Not on file    Forced sexual activity: Not on file  Other Topics Concern   Not on file  Social History Narrative   Married    FAMILY HISTORY: Family History  Problem Relation Age of Onset   Heart failure Mother     ALLERGIES:  has No Known Allergies.  MEDICATIONS:  Current Outpatient Medications  Medication Sig Dispense Refill   aspirin 81 MG tablet Take 81 mg by mouth daily.       atenolol (TENORMIN) 25 MG tablet Take 1 tablet (25 mg total) by mouth daily. 90 tablet 3   brimonidine (ALPHAGAN) 0.2 % ophthalmic solution Place 1 drop into the right eye 2 (two) times daily.       cholecalciferol (VITAMIN D) 1000 UNITS tablet Take 1,000 Units by mouth daily.       finasteride (PROSCAR) 5 MG tablet Take 5 mg by mouth daily.       hydrochlorothiazide (HYDRODIURIL) 25 MG tablet Take 1 tablet (25 mg  total) by mouth daily. 90 tablet 3   isosorbide dinitrate (ISORDIL) 5 MG tablet Take 1 tablet (5 mg total) by mouth 2 (two) times daily. 180 tablet 3   lisinopril (PRINIVIL,ZESTRIL) 10 MG tablet Take 1 tablet (10 mg total) by mouth daily. 90 tablet 3   nitroGLYCERIN (NITROSTAT) 0.4 MG SL tablet Place 1 tablet (0.4 mg total) under the tongue every 5 (five) minutes as needed for chest pain. 25 tablet 6   pravastatin (PRAVACHOL) 40 MG tablet Take 1 tablet (40  mg total) by mouth at bedtime. 90 tablet 3   predniSONE (DELTASONE) 50 MG tablet Take 1 tablet (50 mg total) by mouth daily with breakfast. 7 tablet 0   timolol (TIMOPTIC) 0.5 % ophthalmic solution Place 1 drop into the right eye daily.       No current facility-administered medications for this visit.     REVIEW OF SYSTEMS:    A 10+ POINT REVIEW OF SYSTEMS WAS OBTAINED including neurology, dermatology, psychiatry, cardiac, respiratory, lymph, extremities, GI, GU, Musculoskeletal, constitutional, breasts, reproductive, HEENT.  All pertinent positives are noted in the HPI.  All others are negative.   PHYSICAL EXAMINATION: ECOG PERFORMANCE STATUS: 1 - Symptomatic but completely ambulatory  Vitals:   06/22/18 1044  BP: 127/60  Pulse: 74  Resp: 18  Temp: 97.7 F (36.5 C)  SpO2: 100%   Filed Weights   06/22/18 1044  Weight: 171 lb 1.6 oz (77.6 kg)   .Body mass index is 24.55 kg/m.   GENERAL:alert, in no acute distress and comfortable SKIN: no acute rashes, no significant lesions EYES: conjunctiva are pink and non-injected, sclera anicteric OROPHARYNX: MMM, no exudates, no oropharyngeal erythema or ulceration NECK: supple, no JVD LYMPH: Small palpable left pre-auricular lymph node. Minimally palpable LNadenopathy in b/l cervical, all less than 1cm in size. 3-4cm left axillary LN. 2-3 cm right axillary LN. No palpable lymphadenopathy in the supraclavicular or inguinal regions LUNGS: clear to auscultation b/l with normal  respiratory effort HEART: regular rate & rhythm ABDOMEN:  normoactive bowel sounds , non tender, not distended. No palpable hepatosplenomegaly.  Extremity: no pedal edema PSYCH: alert & oriented x 3 with fluent speech NEURO: no focal motor/sensory deficits   LABORATORY DATA:  I have reviewed the data as listed  . CBC Latest Ref Rng & Units 06/22/2018 03/20/2018 11/19/2017  WBC 4.0 - 10.5 K/uL 6.9 7.6 7.3  Hemoglobin 13.0 - 17.0 g/dL 12.7(L) 12.4(L) 12.7(L)  Hematocrit 39.0 - 52.0 % 40.1 39.0 39.0  Platelets 150 - 400 K/uL 233 238 235    . CMP Latest Ref Rng & Units 06/22/2018 03/20/2018 11/19/2017  Glucose 70 - 99 mg/dL 98 98 100(H)  BUN 8 - 23 mg/dL 27(H) 23 18  Creatinine 0.61 - 1.24 mg/dL 1.02 0.88 0.85  Sodium 135 - 145 mmol/L 142 141 140  Potassium 3.5 - 5.1 mmol/L 3.6 3.8 4.2  Chloride 98 - 111 mmol/L 105 103 101  CO2 22 - 32 mmol/L 26 29 30   Calcium 8.9 - 10.3 mg/dL 9.6 9.5 10.1  Total Protein 6.5 - 8.1 g/dL 7.0 6.8 7.4  Total Bilirubin 0.3 - 1.2 mg/dL 1.0 1.0 0.9  Alkaline Phos 38 - 126 U/L 55 66 71  AST 15 - 41 U/L 16 13(L) 12(L)  ALT 0 - 44 U/L 12 8 9    08/08/17 Pathology:   08/05/17 Pathology:   RADIOGRAPHIC STUDIES: I have personally reviewed the radiological images as listed and agreed with the findings in the report. No results found.  ASSESSMENT & PLAN:   84 y.o. male with  1.Recently diagnosed atleast Stage III Follicular Lymphoma Grade 1-2 presenting with rt cervical lymphadenopathy  08/05/17 pathology results of right cervical LN revealing low grade follicular lymphoma  0/73/71 PET revealed Hypermetabolic lymphadenopathy involving the right neck, bilateral axillary, right hilar and right inguinal lymph nodes. No abdominal or pelvic involvement.    03/20/18 CT C/A/P revealed Progressive bilateral axillary and right inguinal lymphadenopathy, including necrotic lymph nodes in the right axilla, as above. Spleen is  normal in size. 4 mm distal left ureteral calculus,  unchanged. No hydronephrosis. Stable layering bladder calculi measuring up to 5 mm. Cholelithiasis, without associated inflammatory changes. Stable sequela of prior/chronic pancreatitis.   2. 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 3. H/o head and neck Basal cell carcinoma Continue routine skin checks with dermatologist    PLAN -Discussed pt labwork today, 06/22/18; blood counts and chemistries are stable. LDH at 198. -Pt continues to not have constitutional symptoms nor blood count abnormalities. He does have palpable bilateral cervical and axillary lymph nodes, which are mostly unchanged in the interim. -Discussed that in light of the above, there is no overt indication to consider initiating treatment at this time. -Pt is aware of the constitutional symptoms to be mindful of and will let me know in the interim if he develops these. -Hep B core antibody positive, hep b surface antigen negative. Hep C antibody normal at <0.1 -Recommended that the pt continue eating well, stay hydrated, and stay active -Will see the pt back in 4 months   RTC with Dr Irene Limbo with labs in 4 months   All of the patients questions were answered with apparent satisfaction. The patient knows to call the clinic with any problems, questions or concerns.  The total time spent in the appt was 25 minutes and more than 50% was on counseling and direct patient cares.   Sullivan Lone MD MS AAHIVMS [facility] Hematology/Oncology Physician [facility]  (Office):       [phone] (Work cell):  [phone] (Fax):           [phone]  06/22/2018 12:12 PM  I, Baldwin Jamaica, am acting as a scribe for Dr. Sullivan Lone.   .I have reviewed the above documentation for accuracy and completeness, and I agree with the above. Brunetta Genera MD

## 2018-06-22 ENCOUNTER — Other Ambulatory Visit: Payer: Self-pay

## 2018-06-22 ENCOUNTER — Other Ambulatory Visit: Payer: Self-pay | Admitting: Hematology

## 2018-06-22 ENCOUNTER — Telehealth: Payer: Self-pay | Admitting: Hematology

## 2018-06-22 ENCOUNTER — Inpatient Hospital Stay: Payer: Medicare Other | Attending: Hematology

## 2018-06-22 ENCOUNTER — Inpatient Hospital Stay: Payer: Medicare Other | Admitting: Hematology

## 2018-06-22 VITALS — BP 127/60 | HR 74 | Temp 97.7°F | Resp 18 | Ht 70.0 in | Wt 171.1 lb

## 2018-06-22 DIAGNOSIS — C8291 Follicular lymphoma, unspecified, lymph nodes of head, face, and neck: Secondary | ICD-10-CM

## 2018-06-22 DIAGNOSIS — Z79899 Other long term (current) drug therapy: Secondary | ICD-10-CM

## 2018-06-22 DIAGNOSIS — C8201 Follicular lymphoma grade I, lymph nodes of head, face, and neck: Secondary | ICD-10-CM | POA: Insufficient documentation

## 2018-06-22 DIAGNOSIS — Z85820 Personal history of malignant melanoma of skin: Secondary | ICD-10-CM

## 2018-06-22 LAB — CBC WITH DIFFERENTIAL/PLATELET
Abs Immature Granulocytes: 0.02 10*3/uL (ref 0.00–0.07)
Basophils Absolute: 0 10*3/uL (ref 0.0–0.1)
Basophils Relative: 0 %
Eosinophils Absolute: 0.1 10*3/uL (ref 0.0–0.5)
Eosinophils Relative: 1 %
HCT: 40.1 % (ref 39.0–52.0)
Hemoglobin: 12.7 g/dL — ABNORMAL LOW (ref 13.0–17.0)
Immature Granulocytes: 0 %
Lymphocytes Relative: 14 %
Lymphs Abs: 1 10*3/uL (ref 0.7–4.0)
MCH: 28.3 pg (ref 26.0–34.0)
MCHC: 31.7 g/dL (ref 30.0–36.0)
MCV: 89.5 fL (ref 80.0–100.0)
Monocytes Absolute: 0.5 10*3/uL (ref 0.1–1.0)
Monocytes Relative: 7 %
Neutro Abs: 5.3 10*3/uL (ref 1.7–7.7)
Neutrophils Relative %: 78 %
Platelets: 233 10*3/uL (ref 150–400)
RBC: 4.48 MIL/uL (ref 4.22–5.81)
RDW: 13.2 % (ref 11.5–15.5)
WBC: 6.9 10*3/uL (ref 4.0–10.5)
nRBC: 0 % (ref 0.0–0.2)

## 2018-06-22 LAB — CMP (CANCER CENTER ONLY)
ALT: 12 U/L (ref 0–44)
AST: 16 U/L (ref 15–41)
Albumin: 4.3 g/dL (ref 3.5–5.0)
Alkaline Phosphatase: 55 U/L (ref 38–126)
Anion gap: 11 (ref 5–15)
BUN: 27 mg/dL — ABNORMAL HIGH (ref 8–23)
CO2: 26 mmol/L (ref 22–32)
Calcium: 9.6 mg/dL (ref 8.9–10.3)
Chloride: 105 mmol/L (ref 98–111)
Creatinine: 1.02 mg/dL (ref 0.61–1.24)
GFR, Est AFR Am: >60 mL/min (ref >60)
GFR, Estimated: >60 mL/min (ref >60)
Glucose, Bld: 98 mg/dL (ref 70–99)
Potassium: 3.6 mmol/L (ref 3.5–5.1)
Sodium: 142 mmol/L (ref 135–145)
Total Bilirubin: 1 mg/dL (ref 0.3–1.2)
Total Protein: 7 g/dL (ref 6.5–8.1)

## 2018-06-22 LAB — LACTATE DEHYDROGENASE: LDH: 198 U/L — ABNORMAL HIGH (ref 98–192)

## 2018-06-22 NOTE — Telephone Encounter (Signed)
Scheduled appt per 4/6 los. Left a VM of scheduled appts

## 2018-10-21 NOTE — Progress Notes (Signed)
HEMATOLOGY/ONCOLOGY CONSULTATION NOTE  Date of Service: 10/22/2018  Patient Care Team: [name], MD as PCP - General (Family Medicine) [name], MD as PCP - Cardiology (Cardiology)  CHIEF COMPLAINTS/PURPOSE OF CONSULTATION:  Follicular Lymphoma  HISTORY OF PRESENTING ILLNESS:   Harold [name] is a wonderful 84 y.o. male who has been referred to [place] by Dr [name] for evaluation and management of Follicular Lymphoma. He is accompanied today by his sister. The pt reports that he is doing well overall.   The pt reports first noticing the rt neck mass develop in his neck about a year ago and saw his PCP Dr [name].  The mass was getting larger and so he was referred to Dr [name] for further evaluation and had a biopsy of the rt upper neck mass which showed Grade 1-2 follicular lymphoma. pt has had Tissue flow cytometry completed on 08/05/17 with results revealing A LAMBDA-RESTRICTED MONOCLONAL B-CELL POPULATION EXPRESSING CD10 COMPRISES 39% OF ALL LYMPHOCYTES.  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 He denies feeling any differently now than he did a year ago. He takes daily 81mg  aspirin which he notes has led to some mild increased bruising.   Of note prior to the patient's visit today,  He notes that he has had a couple spots of melanoma on his chin and back about 5 and 8 years ago. These were subsequently removed. He has had a basal cell as well and has been treated several times with liquid nitrogen.   On review of systems, pt reports stable energy levels, some bruising, stable bowel habits, and denies fevers, chills, night sweats, unexpected weight loss, pain along the spine, abdominal pains, testicular pain or swelling, and any other symptoms.   On PMHx the pt reports melanoma, CAD, HTN, HLD,  cataract in left eye.  Interval History:   Harold [name] returns today for management and evaluation of his follicular lymphoma.The patient's last visit with [place] was on 06/22/2018. The pt reports that he is doing well overall.  The pt reports a new bump in his right inguinal area that feels "like a bone" It is painful every once in a while. Is has been present for a while, but he feels that it is growing and changing shape. He notes lymph nodes in both armpits and on the left side of his neck.  He notes some weight loss and thinks it is because he has been doing a lot of yard work. He reports that his wife's mental status and memory is declining causing her to cook less. At a result, the pt is not eating as much. He eats mostly vegetables from his garden and sandwiches.   A couple of weeks ago, he noticed some swelling in his medial right thigh. Sometimes he feels a stinging pain in the area. His right foot is also swollen.  Lab results today (10/22/2018) of CBC w/diff and CMP is as follows: all values are WNL except for RBC at 4.07, HGB at 11.4, HCT at 36.1, BUN at 25, AST in 13. 10/22/2018 LDH at 151  On review of systems, pt reports unintentional weight loss, new lymph node in the right inguinal region, fatigue, B/L axillary lymph nodes, and denies swallowing problems and any other symptoms.   MEDICAL HISTORY:  Past Medical History:  Diagnosis Date  .  BPH (benign prostatic hypertrophy)   . CAD (coronary artery disease) 01/2010   S/P drug eluting stents LAD and PL branch of RCA Dr. Olevia Perches  . follicular lymphoma dx'd 06/2017  . Hernia   . HLD (hyperlipidemia)   . HTN (hypertension)     SURGICAL HISTORY: Past Surgical History:  Procedure Laterality Date  . BACK SURGERY    . HERNIA REPAIR  01/08/11   RIH  . MASS EXCISION Right 08/05/2017   Procedure: EXCISION RIGHT POSTERIOR  NECK LYMPH NODE;  Surgeon: Rozetta Nunnery, MD;  Location: [facility];  Service: ENT;   Laterality: Right;    SOCIAL HISTORY: Social History   Socioeconomic History  . Marital status: Married    Spouse name: Not on file  . Number of children: Not on file  . Years of education: Not on file  . Highest education level: Not on file  Occupational History  . Not on file  Social Needs  . Financial resource strain: Not on file  . Food insecurity    Worry: Not on file    Inability: Not on file  . Transportation needs    Medical: Not on file    Non-medical: Not on file  Tobacco Use  . Smoking status: Never Smoker  . Smokeless tobacco: Never Used  Substance and Sexual Activity  . Alcohol use: No  . Drug use: No  . Sexual activity: Not on file  Lifestyle  . Physical activity    Days per week: Not on file    Minutes per session: Not on file  . Stress: Not on file  Relationships  . Social Herbalist on phone: Not on file    Gets together: Not on file    Attends religious service: Not on file    Active member of club or organization: Not on file    Attends meetings of clubs or organizations: Not on file    Relationship status: Not on file  . Intimate partner violence    Fear of current or ex partner: Not on file    Emotionally abused: Not on file    Physically abused: Not on file    Forced sexual activity: Not on file  Other Topics Concern  . Not on file  Social History Narrative   Married    FAMILY HISTORY: Family History  Problem Relation Age of Onset  . Heart failure Mother     ALLERGIES:  has No Known Allergies.  MEDICATIONS:  Current Outpatient Medications  Medication Sig Dispense Refill  . aspirin 81 MG tablet Take 81 mg by mouth daily.      [name] atenolol (TENORMIN) 25 MG tablet Take 1 tablet (25 mg total) by mouth daily. 90 tablet 3  . brimonidine (ALPHAGAN) 0.2 % ophthalmic solution Place 1 drop into the right eye 2 (two) times daily.      . cholecalciferol (VITAMIN D) 1000 UNITS tablet Take 1,000 Units by mouth daily.      .  finasteride (PROSCAR) 5 MG tablet Take 5 mg by mouth daily.      . hydrochlorothiazide (HYDRODIURIL) 25 MG tablet Take 1 tablet (25 mg total) by mouth daily. 90 tablet 3  . isosorbide dinitrate (ISORDIL) 5 MG tablet Take 1 tablet (5 mg total) by mouth 2 (two) times daily. 180 tablet 3  . lisinopril (PRINIVIL,ZESTRIL) 10 MG tablet Take 1 tablet (10 mg total) by mouth daily. 90 tablet 3  . nitroGLYCERIN (NITROSTAT) 0.4 MG  SL tablet Place 1 tablet (0.4 mg total) under the tongue every 5 (five) minutes as needed for chest pain. 25 tablet 6  . pravastatin (PRAVACHOL) 40 MG tablet Take 1 tablet (40 mg total) by mouth at bedtime. 90 tablet 3  . predniSONE (DELTASONE) 50 MG tablet Take 1 tablet (50 mg total) by mouth daily with breakfast. 7 tablet 0  . timolol (TIMOPTIC) 0.5 % ophthalmic solution Place 1 drop into the right eye daily.       No current facility-administered medications for this visit.     REVIEW OF SYSTEMS:    A 10+ POINT REVIEW OF SYSTEMS WAS OBTAINED including neurology, dermatology, psychiatry, cardiac, respiratory, lymph, extremities, GI, GU, Musculoskeletal, constitutional, breasts, reproductive, HEENT.  All pertinent positives are noted in the HPI.  All others are negative.   PHYSICAL EXAMINATION: ECOG PERFORMANCE STATUS: 1 - Symptomatic but completely ambulatory  Vitals:   10/22/18 1025  BP: 114/60  Pulse: 69  Resp: 17  Temp: 98.9 F (37.2 C)  SpO2: 100%   Filed Weights   10/22/18 1025  Weight: 166 lb 12.8 oz (75.7 kg)   .Body mass index is 23.93 kg/m.  GENERAL:alert, in no acute distress and comfortable SKIN: no acute rashes, no significant lesions EYES: conjunctiva are pink and non-injected, sclera anicteric OROPHARYNX: MMM, no exudates, no oropharyngeal erythema or ulceration NECK: supple, no JVD LYMPH:  Small palpable left pre-auricular lymph node. Minimally palpable LNadenopathy in b/l cervical, all less than 1cm in size. 3-4cm left axillary LN. 5-6 cm  right axillary LN. Two LN in right inguinal region. LUNGS: clear to auscultation b/l with normal respiratory effort HEART: regular rate & rhythm ABDOMEN:  normoactive bowel sounds , non tender, not distended. No palpable hepatosplenomegaly.  Extremity: 1+ ankle edema on right side PSYCH: alert & oriented x 3 with fluent speech NEURO: no focal motor/sensory deficits   LABORATORY DATA:  I have reviewed the data as listed  . CBC Latest Ref Rng & Units 10/22/2018 06/22/2018 03/20/2018  WBC 4.0 - 10.5 K/uL 6.5 6.9 7.6  Hemoglobin 13.0 - 17.0 g/dL 11.4(L) 12.7(L) 12.4(L)  Hematocrit 39.0 - 52.0 % 36.1(L) 40.1 39.0  Platelets 150 - 400 K/uL 207 233 238    . CMP Latest Ref Rng & Units 10/22/2018 06/22/2018 03/20/2018  Glucose 70 - 99 mg/dL 94 98 98  BUN 8 - 23 mg/dL 25(H) 27(H) 23  Creatinine 0.61 - 1.24 mg/dL 1.00 1.02 0.88  Sodium 135 - 145 mmol/L 141 142 141  Potassium 3.5 - 5.1 mmol/L 3.8 3.6 3.8  Chloride 98 - 111 mmol/L 105 105 103  CO2 22 - 32 mmol/L 25 26 29   Calcium 8.9 - 10.3 mg/dL 9.4 9.6 9.5  Total Protein 6.5 - 8.1 g/dL 6.6 7.0 6.8  Total Bilirubin 0.3 - 1.2 mg/dL 0.8 1.0 1.0  Alkaline Phos 38 - 126 U/L 58 55 66  AST 15 - 41 U/L 13(L) 16 13(L)  ALT 0 - 44 U/L 10 12 8    08/08/17 Pathology:   08/05/17 Pathology:   RADIOGRAPHIC STUDIES: I have personally reviewed the radiological images as listed and agreed with the findings in the report. No results found.  ASSESSMENT & PLAN:   84 y.o. male with  1.Recently diagnosed atleast Stage III Follicular Lymphoma Grade 1-2 presenting with rt cervical lymphadenopathy  08/05/17 pathology results of right cervical LN revealing low grade follicular lymphoma  6/71/24 PET revealed Hypermetabolic lymphadenopathy involving the right neck, bilateral axillary, right hilar and right  inguinal lymph nodes. No abdominal or pelvic involvement.    03/20/18 CT C/A/P revealed Progressive bilateral axillary and right inguinal lymphadenopathy, including  necrotic lymph nodes in the right axilla, as above. Spleen is normal in size. 4 mm distal left ureteral calculus, unchanged. No hydronephrosis. Stable layering bladder calculi measuring up to 5 mm. Cholelithiasis, without associated inflammatory changes. Stable sequela of prior/chronic pancreatitis.   2. 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 3. H/o head and neck Basal cell carcinoma Continue routine skin checks with dermatologist    PLAN -Discussed pt labwork today, 10/22/2018; blood counts are steady, mild anemia, blood chemistries are stable, LDH at 151 -Discussed that his lymphoma is slow growing and that the goal of treatment at this time is to manage his symptoms.  -Discussed the patient's treatment options: 1-2 weeks of prednisone vs Rituxan weekly x4. Would not recommend chemotherapy at this time.  -Offered a referral to nutritional therapist, pt declined -Recommend speaking with PCP about the management of his wife's declining cognitive status -Hep B core antibody positive, hep b surface antigen negative. Hep C antibody normal at <0.1 -Will begin Rituxan weekly x4 -Schedule CT scan before treatment   Chemo-education for Rituxan CT neck/chest/abd/pelvis in 1 week Schedule to start Rituxan in 2 weeks (weekly x 4 doses) labs with each treatment MD visit with 1st and 3rd dose of Rituxan   All of the patients questions were answered with apparent satisfaction. The patient knows to call the clinic with any problems, questions or concerns.  The total time spent in the appt was 50 minutes and more than 50% was on counseling and direct patient cares.  Sullivan Lone MD MS AAHIVMS [facility] [place] Hematology/Oncology Physician [facility]  (Office):       [phone] (Work cell):  [phone] (Fax):           [phone]  10/22/2018 11:51 AM  I, De Burrs, am acting as a scribe for Dr. Irene Limbo  .I have reviewed the above documentation for accuracy and completeness, and I  agree with the above. Brunetta Genera MD

## 2018-10-22 ENCOUNTER — Other Ambulatory Visit: Payer: Self-pay

## 2018-10-22 ENCOUNTER — Inpatient Hospital Stay: Payer: Medicare Other | Attending: Hematology

## 2018-10-22 ENCOUNTER — Telehealth: Payer: Self-pay | Admitting: Hematology

## 2018-10-22 ENCOUNTER — Inpatient Hospital Stay: Payer: Medicare Other | Admitting: Hematology

## 2018-10-22 VITALS — BP 114/60 | HR 69 | Temp 98.9°F | Resp 17 | Ht 70.0 in | Wt 166.8 lb

## 2018-10-22 DIAGNOSIS — D649 Anemia, unspecified: Secondary | ICD-10-CM | POA: Insufficient documentation

## 2018-10-22 DIAGNOSIS — C8291 Follicular lymphoma, unspecified, lymph nodes of head, face, and neck: Secondary | ICD-10-CM | POA: Diagnosis not present

## 2018-10-22 DIAGNOSIS — C8201 Follicular lymphoma grade I, lymph nodes of head, face, and neck: Secondary | ICD-10-CM | POA: Diagnosis not present

## 2018-10-22 DIAGNOSIS — Z85820 Personal history of malignant melanoma of skin: Secondary | ICD-10-CM | POA: Diagnosis not present

## 2018-10-22 DIAGNOSIS — Z79899 Other long term (current) drug therapy: Secondary | ICD-10-CM | POA: Diagnosis not present

## 2018-10-22 LAB — CMP (CANCER CENTER ONLY)
ALT: 10 U/L (ref 0–44)
AST: 13 U/L — ABNORMAL LOW (ref 15–41)
Albumin: 3.9 g/dL (ref 3.5–5.0)
Alkaline Phosphatase: 58 U/L (ref 38–126)
Anion gap: 11 (ref 5–15)
BUN: 25 mg/dL — ABNORMAL HIGH (ref 8–23)
CO2: 25 mmol/L (ref 22–32)
Calcium: 9.4 mg/dL (ref 8.9–10.3)
Chloride: 105 mmol/L (ref 98–111)
Creatinine: 1 mg/dL (ref 0.61–1.24)
GFR, Est AFR Am: >60 mL/min (ref >60)
GFR, Estimated: >60 mL/min (ref >60)
Glucose, Bld: 94 mg/dL (ref 70–99)
Potassium: 3.8 mmol/L (ref 3.5–5.1)
Sodium: 141 mmol/L (ref 135–145)
Total Bilirubin: 0.8 mg/dL (ref 0.3–1.2)
Total Protein: 6.6 g/dL (ref 6.5–8.1)

## 2018-10-22 LAB — CBC WITH DIFFERENTIAL/PLATELET
Abs Immature Granulocytes: 0.01 10*3/uL (ref 0.00–0.07)
Basophils Absolute: 0 10*3/uL (ref 0.0–0.1)
Basophils Relative: 0 %
Eosinophils Absolute: 0.1 10*3/uL (ref 0.0–0.5)
Eosinophils Relative: 1 %
HCT: 36.1 % — ABNORMAL LOW (ref 39.0–52.0)
Hemoglobin: 11.4 g/dL — ABNORMAL LOW (ref 13.0–17.0)
Immature Granulocytes: 0 %
Lymphocytes Relative: 14 %
Lymphs Abs: 0.9 10*3/uL (ref 0.7–4.0)
MCH: 28 pg (ref 26.0–34.0)
MCHC: 31.6 g/dL (ref 30.0–36.0)
MCV: 88.7 fL (ref 80.0–100.0)
Monocytes Absolute: 0.7 10*3/uL (ref 0.1–1.0)
Monocytes Relative: 11 %
Neutro Abs: 4.8 10*3/uL (ref 1.7–7.7)
Neutrophils Relative %: 74 %
Platelets: 207 10*3/uL (ref 150–400)
RBC: 4.07 MIL/uL — ABNORMAL LOW (ref 4.22–5.81)
RDW: 13.9 % (ref 11.5–15.5)
WBC: 6.5 10*3/uL (ref 4.0–10.5)
nRBC: 0 % (ref 0.0–0.2)

## 2018-10-22 LAB — LACTATE DEHYDROGENASE: LDH: 151 U/L (ref 98–192)

## 2018-10-22 NOTE — Telephone Encounter (Signed)
Scheduled appt per 8/6 los.  Patient stated to not call about his appts because his wife will get his appts confused and that he is hard of hearing.  Printed and mailed appt calendar per the patient request.

## 2018-10-27 ENCOUNTER — Telehealth: Payer: Self-pay | Admitting: *Deleted

## 2018-10-27 ENCOUNTER — Other Ambulatory Visit: Payer: Self-pay | Admitting: Hematology

## 2018-10-27 NOTE — Telephone Encounter (Signed)
Patient called to ask about appointments and if the doctor was still going to treat him. Informed patient that Dr. Irene Limbo had ordered treatment and that he had multiple appointments scheduled.  Patient offered a reading of appointments, he stated no thank you, he was hard of hearing and might get them confused. Offered to mail a calendar of all his upcoming appointments. Calendar mailed. He verbalized appreciation. Informed him that the first appointment was Monday morning 8/17 at 10 am for Patient Education. Patient verbalized understanding.

## 2018-11-02 ENCOUNTER — Inpatient Hospital Stay: Payer: Medicare Other

## 2018-11-02 ENCOUNTER — Other Ambulatory Visit: Payer: Self-pay

## 2018-11-02 NOTE — Progress Notes (Signed)
HEMATOLOGY/ONCOLOGY CONSULTATION NOTE  Date of Service: 11/02/2018  Patient Care Team: [name], MD as PCP - General (Family Medicine) [name], MD as PCP - Cardiology (Cardiology)  CHIEF COMPLAINTS/PURPOSE OF CONSULTATION:  Follicular Lymphoma  HISTORY OF PRESENTING ILLNESS:   Harold [name] is a wonderful 84 y.o. male who has been referred to [place] by Dr [name] for evaluation and management of Follicular Lymphoma. He is accompanied today by his sister. The pt reports that he is doing well overall.   The pt reports first noticing the rt neck mass develop in his neck about a year ago and saw his PCP Dr [name].  The mass was getting larger and so he was referred to Dr [name] for further evaluation and had a biopsy of the rt upper neck mass which showed Grade 1-2 follicular lymphoma. pt has had Tissue flow cytometry completed on 08/05/17 with results revealing A LAMBDA-RESTRICTED MONOCLONAL B-CELL POPULATION EXPRESSING CD10 COMPRISES 39% OF ALL LYMPHOCYTES.  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 He denies feeling any differently now than he did a year ago. He takes daily 81mg  aspirin which he notes has led to some mild increased bruising.   Of note prior to the patient's visit today,  He notes that he has had a couple spots of melanoma on his chin and back about 5 and 8 years ago. These were subsequently removed. He has had a basal cell as well and has been treated several times with liquid nitrogen.   On review of systems, pt reports stable energy levels, some bruising, stable bowel habits, and denies fevers, chills, night sweats, unexpected weight loss, pain along the spine, abdominal pains, testicular pain or swelling, and any other symptoms.   On PMHx the pt reports melanoma, CAD, HTN, HLD,  cataract in left eye.  Interval History:   Harold [name] returns today for management and evaluation of his follicular lymphoma. The patient's last visit with [place] was on 10/22/2018. The pt reports that he is doing well overall.   The pt reports additional fatigue. He went to his PCP for pain in his right foot and leg. They did an ultrasound completed on 10/29/2018, which revealed no acute DVT.  Of note since the patient's last visit, pt has had CT neck completed on 11/03/2018 with results revealing "Marked interval progression of cervical lymphadenopathy since PET-CT 09/02/2017, now with lymphadenopathy present bilaterally at essentially all stations. Index nodes as described. This includes bilateral intraparotid lymphadenopathy. Bulky right-sided adenopathy results in multifocal narrowing of the right internal jugular vein. Significant narrowing of the mid to lower left internal jugular vein of unclear cause as there is no significant mass effect at this level."  The pt also had a CT CAP completed on 11/03/2018 with results revealing "1. Progression of bilateral axillary, retroperitoneal, pelvic, and inguinal adenopathy. New anterior and posterior lower cervical adenopathy identified within the neck. 2. Persistent distal left ureteral calculus. Small bladder calculi are unchanged. 3. Aortic Atherosclerosis (ICD10-I70.0). Coronary artery calcifications. 4. Sequelae of chronic pancreatitis noted."  Lab results today (11/05/2018) of CBC w/diff and CMP is as follows: all values are WNL except for RBC at 4.19, Hgb at 11.8, Hct at 36.5, .  On review of systems, pt reports pain in his jaw, right foot & right leg and denies any  other symptoms.   MEDICAL HISTORY:  Past Medical History:  Diagnosis Date  . BPH (benign prostatic hypertrophy)   . CAD (coronary artery disease) 01/2010   S/P drug eluting stents LAD and PL branch of RCA Dr. Olevia Perches  . follicular lymphoma dx'd 06/2017  . Hernia   . HLD  (hyperlipidemia)   . HTN (hypertension)     SURGICAL HISTORY: Past Surgical History:  Procedure Laterality Date  . BACK SURGERY    . HERNIA REPAIR  01/08/11   [facility]  . MASS EXCISION Right 08/05/2017   Procedure: EXCISION RIGHT POSTERIOR  NECK LYMPH NODE;  Surgeon: Rozetta Nunnery, MD;  Location: [facility];  Service: ENT;  Laterality: Right;    SOCIAL HISTORY: Social History   Socioeconomic History  . Marital status: Married    Spouse name: Not on file  . Number of children: Not on file  . Years of education: Not on file  . Highest education level: Not on file  Occupational History  . Not on file  Social Needs  . Financial resource strain: Not on file  . Food insecurity    Worry: Not on file    Inability: Not on file  . Transportation needs    Medical: Not on file    Non-medical: Not on file  Tobacco Use  . Smoking status: Never Smoker  . Smokeless tobacco: Never Used  Substance and Sexual Activity  . Alcohol use: No  . Drug use: No  . Sexual activity: Not on file  Lifestyle  . Physical activity    Days per week: Not on file    Minutes per session: Not on file  . Stress: Not on file  Relationships  . Social Herbalist on phone: Not on file    Gets together: Not on file    Attends religious service: Not on file    Active member of club or organization: Not on file    Attends meetings of clubs or organizations: Not on file    Relationship status: Not on file  . Intimate partner violence    Fear of current or ex partner: Not on file    Emotionally abused: Not on file    Physically abused: Not on file    Forced sexual activity: Not on file  Other Topics Concern  . Not on file  Social History Narrative   Married    FAMILY HISTORY: Family History  Problem Relation Age of Onset  . Heart failure Mother     ALLERGIES:  has No Known Allergies.  MEDICATIONS:  Current Outpatient Medications  Medication Sig Dispense Refill  .  aspirin 81 MG tablet Take 81 mg by mouth daily.      [name] atenolol (TENORMIN) 25 MG tablet Take 1 tablet (25 mg total) by mouth daily. 90 tablet 3  . brimonidine (ALPHAGAN) 0.2 % ophthalmic solution Place 1 drop into the right eye 2 (two) times daily.      . cholecalciferol (VITAMIN D) 1000 UNITS tablet Take 1,000 Units by mouth daily.      . finasteride (PROSCAR) 5 MG tablet Take 5 mg by mouth daily.      . hydrochlorothiazide (HYDRODIURIL) 25 MG tablet Take 1 tablet (25 mg total) by mouth daily. 90 tablet 3  . isosorbide dinitrate (ISORDIL) 5 MG tablet Take 1 tablet (5 mg total) by mouth 2 (two) times daily. 180 tablet 3  . lisinopril (PRINIVIL,ZESTRIL) 10 MG tablet Take 1 tablet (  10 mg total) by mouth daily. 90 tablet 3  . nitroGLYCERIN (NITROSTAT) 0.4 MG SL tablet Place 1 tablet (0.4 mg total) under the tongue every 5 (five) minutes as needed for chest pain. 25 tablet 6  . pravastatin (PRAVACHOL) 40 MG tablet Take 1 tablet (40 mg total) by mouth at bedtime. 90 tablet 3  . timolol (TIMOPTIC) 0.5 % ophthalmic solution Place 1 drop into the right eye daily.       No current facility-administered medications for this visit.     REVIEW OF SYSTEMS:    A 10+ POINT REVIEW OF SYSTEMS WAS OBTAINED including neurology, dermatology, psychiatry, cardiac, respiratory, lymph, extremities, GI, GU, Musculoskeletal, constitutional, breasts, reproductive, HEENT.  All pertinent positives are noted in the HPI.  All others are negative.   PHYSICAL EXAMINATION: ECOG PERFORMANCE STATUS: 1 - Symptomatic but completely ambulatory  There were no vitals filed for this visit. There were no vitals filed for this visit. .There is no height or weight on file to calculate BMI.  Exam given in a chair  GENERAL:alert, in no acute distress and comfortable SKIN: no acute rashes, no significant lesions EYES: conjunctiva are pink and non-injected, sclera anicteric OROPHARYNX: MMM, no exudates, no oropharyngeal erythema or  ulceration NECK: supple, no JVD LYMPH: Small palpable left pre-auricular lymph node. Minimally palpable LNadenopathy in b/l cervical, all less than 1cm in size. 3-4cm left axillary LN. 5-6 cm right axillary LN. Two LN in right inguinal region. LUNGS: clear to auscultation b/l with normal respiratory effort HEART: regular rate & rhythm ABDOMEN:  normoactive bowel sounds , non tender, not distended. No palpable hepatosplenomegaly.  Extremity: 1+- 2+ pedal edema PSYCH: alert & oriented x 3 with fluent speech NEURO: no focal motor/sensory deficits   LABORATORY DATA:  I have reviewed the data as listed  . CBC Latest Ref Rng & Units 10/22/2018 06/22/2018 03/20/2018  WBC 4.0 - 10.5 K/uL 6.5 6.9 7.6  Hemoglobin 13.0 - 17.0 g/dL 11.4(L) 12.7(L) 12.4(L)  Hematocrit 39.0 - 52.0 % 36.1(L) 40.1 39.0  Platelets 150 - 400 K/uL 207 233 238    . CMP Latest Ref Rng & Units 10/22/2018 06/22/2018 03/20/2018  Glucose 70 - 99 mg/dL 94 98 98  BUN 8 - 23 mg/dL 25(H) 27(H) 23  Creatinine 0.61 - 1.24 mg/dL 1.00 1.02 0.88  Sodium 135 - 145 mmol/L 141 142 141  Potassium 3.5 - 5.1 mmol/L 3.8 3.6 3.8  Chloride 98 - 111 mmol/L 105 105 103  CO2 22 - 32 mmol/L 25 26 29   Calcium 8.9 - 10.3 mg/dL 9.4 9.6 9.5  Total Protein 6.5 - 8.1 g/dL 6.6 7.0 6.8  Total Bilirubin 0.3 - 1.2 mg/dL 0.8 1.0 1.0  Alkaline Phos 38 - 126 U/L 58 55 66  AST 15 - 41 U/L 13(L) 16 13(L)  ALT 0 - 44 U/L 10 12 8    08/08/17 Pathology:   08/05/17 Pathology:   RADIOGRAPHIC STUDIES: I have personally reviewed the radiological images as listed and agreed with the findings in the report. No results found.  ASSESSMENT & PLAN:   84 y.o. male with  1.Recently diagnosed atleast Stage III Follicular Lymphoma Grade 1-2 presenting with rt cervical lymphadenopathy  08/05/17 pathology results of right cervical LN revealing low grade follicular lymphoma  3/71/69 PET revealed Hypermetabolic lymphadenopathy involving the right neck, bilateral axillary,  right hilar and right inguinal lymph nodes. No abdominal or pelvic involvement.    03/20/18 CT C/A/P revealed Progressive bilateral axillary and right inguinal lymphadenopathy,  including necrotic lymph nodes in the right axilla, as above. Spleen is normal in size. 4 mm distal left ureteral calculus, unchanged. No hydronephrosis. Stable layering bladder calculi measuring up to 5 mm. Cholelithiasis, without associated inflammatory changes. Stable sequela of prior/chronic pancreatitis.   11/03/2018 CT neck revealed "Marked interval progression of cervical lymphadenopathy since PET-CT 09/02/2017, now with lymphadenopathy present bilaterally at essentially all stations. Index nodes as described. This includes bilateral intraparotid lymphadenopathy. Bulky right-sided adenopathy results in multifocal narrowing of the right internal jugular vein. Significant narrowing of the mid to lower left internal jugular vein of unclear cause as there is no significant mass effect at this level."  11/03/2018 CT CAP revealed "1. Progression of bilateral axillary, retroperitoneal, pelvic, and inguinal adenopathy. New anterior and posterior lower cervical adenopathy identified within the neck. 2. Persistent distal left ureteral calculus. Small bladder calculi are unchanged. 3. Aortic Atherosclerosis (ICD10-I70.0). Coronary artery calcifications. 4. Sequelae of chronic pancreatitis noted."  2. 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 3. H/o head and neck Basal cell carcinoma Continue routine skin checks with dermatologist    PLAN: -Discussed pt labwork today, 11/05/2018; blood counts are looking relatively normal.  -11/03/2018 CT neck, CAP shows progression of lymphadenopathy in the chest, pelvis and the neck. -Discussed that his lymphoma is slow growing and that the goal of treatment at this time is to manage his symptoms.  -Pt is here today for his 1st dose of Rituxan weekly x4. Would not recommend chemotherapy at this time  but may consider in the future.   -Hep B core antibody positive, hep b surface antigen negative. Hep B DNA PCR neg, Hep C antibody normal at <0.1 -Rx Allopurinol and Zofran -Will see the pt back in 1 week   Plz add MD visit for toxicity check with C2 of Rituxan in 1 week   All of the patients questions were answered with apparent satisfaction. The patient knows to call the clinic with any problems, questions or concerns.  The total time spent in the appt was 25 minutes and more than 50% was on counseling and direct patient cares.  Sullivan Lone MD MS AAHIVMS [facility] Hematology/Oncology Physician [facility]  (Office):       [phone] (Work cell):  [phone] (Fax):           [phone]  11/02/2018 1:55 PM  I, De Burrs, am acting as a scribe for Dr. Irene Limbo  .I have reviewed the above documentation for accuracy and completeness, and I agree with the above. Brunetta Genera MD

## 2018-11-03 ENCOUNTER — Ambulatory Visit (HOSPITAL_COMMUNITY)
Admission: RE | Admit: 2018-11-03 | Discharge: 2018-11-03 | Disposition: A | Discharge status: Home / Self Care | Payer: Medicare Other | Source: Ambulatory Visit | Attending: Hematology | Admitting: Hematology

## 2018-11-03 ENCOUNTER — Encounter (HOSPITAL_COMMUNITY): Payer: Self-pay

## 2018-11-03 ENCOUNTER — Other Ambulatory Visit: Payer: Self-pay | Admitting: Hematology

## 2018-11-03 DIAGNOSIS — Z7189 Other specified counseling: Secondary | ICD-10-CM

## 2018-11-03 DIAGNOSIS — C8208 Follicular lymphoma grade I, lymph nodes of multiple sites: Secondary | ICD-10-CM | POA: Insufficient documentation

## 2018-11-03 DIAGNOSIS — C8291 Follicular lymphoma, unspecified, lymph nodes of head, face, and neck: Secondary | ICD-10-CM | POA: Insufficient documentation

## 2018-11-03 MED ORDER — SODIUM CHLORIDE (PF) 0.9 % IJ SOLN
INTRAMUSCULAR | Status: AC
  Filled 2018-11-03: qty 50

## 2018-11-03 MED ORDER — IOHEXOL 300 MG/ML  SOLN
100.0000 mL | Freq: Once | INTRAMUSCULAR | Status: AC | PRN
  Administered 2018-11-03: 100 mL via INTRAVENOUS

## 2018-11-03 MED ORDER — IOHEXOL 300 MG/ML  SOLN
30.0000 mL | Freq: Once | INTRAMUSCULAR | Status: AC
  Administered 2018-11-03: 30 mL via ORAL

## 2018-11-03 NOTE — Progress Notes (Signed)
Rapid Infusion Rituximab Pharmacist Evaluation  Patients may be eligible for Rapid Infusion Rituximab (RIR) if they have no significant cardiac disease, no risk for Tumor Lysis Syndrome (TLS), received rituximab within the last 6 months, and tolerated those infusions per standard protocol without grade 3-4 infusion reactions. A pharmacist has verified the patient tolerated rituximab infusions per the [facility] standard infusion protocol without grade 3-4 infusion reactions. The treatment plan will be updated to reflect RIR if the patient qualifies per the checklist below.   Harold [name] is a 84 y.o. male being treated with rituximab for follicular lymphoma. This patient may be considered for RIR.    Age > 70 years old Yes   Stable renal, hepatic, and hematologic function Yes   Recent Pertinent Lab Values  Lab Results  Component Value Date   CREATININE 1.00 10/22/2018   BILITOT 0.8 10/22/2018   Lab Results  Component Value Date   WBC 6.5 10/22/2018   LYMPHSABS 0.9 10/22/2018   PLT 207 10/22/2018     Prior documented reaction to rituximab No   Previous rituximab infusion within 6 months No   Physician approval of RIR Will f/u after 1st dose  Treatment Plan updated orders to reflect RIR Will f/u after 1st dose    Tora Kindred 11/03/18 2:19 PM

## 2018-11-03 NOTE — Progress Notes (Signed)
START ON PATHWAY REGIMEN - Lymphoma and CLL     Administer weekly:     Rituximab-xxxx   **Always confirm dose/schedule in your pharmacy ordering system**  Patient Characteristics: Follicular Lymphoma, Grades 1, 2, and 3A, First Line, Stage III / IV, Symptomatic or Bulky Disease Disease Type: Follicular Lymphoma, Grade 1, 2, or 3A Disease Type: Not Applicable Disease Type: Not Applicable [place] Stage: III Line of Therapy: First Line Disease Characteristics: Symptomatic or Bulky Disease Intent of Therapy: Non-Curative / Palliative Intent, Discussed with Patient

## 2018-11-05 ENCOUNTER — Other Ambulatory Visit: Payer: Self-pay

## 2018-11-05 ENCOUNTER — Inpatient Hospital Stay: Payer: Medicare Other

## 2018-11-05 ENCOUNTER — Inpatient Hospital Stay: Payer: Medicare Other | Admitting: Hematology

## 2018-11-05 ENCOUNTER — Telehealth: Payer: Self-pay | Admitting: Hematology

## 2018-11-05 VITALS — BP 130/53 | HR 95 | Temp 98.7°F | Resp 18 | Ht 70.0 in | Wt 170.2 lb

## 2018-11-05 VITALS — BP 124/57 | HR 82 | Temp 98.4°F | Resp 20

## 2018-11-05 DIAGNOSIS — Z5112 Encounter for antineoplastic immunotherapy: Secondary | ICD-10-CM

## 2018-11-05 DIAGNOSIS — C8201 Follicular lymphoma grade I, lymph nodes of head, face, and neck: Secondary | ICD-10-CM | POA: Diagnosis not present

## 2018-11-05 DIAGNOSIS — C8208 Follicular lymphoma grade I, lymph nodes of multiple sites: Secondary | ICD-10-CM

## 2018-11-05 DIAGNOSIS — Z7189 Other specified counseling: Secondary | ICD-10-CM

## 2018-11-05 DIAGNOSIS — C8291 Follicular lymphoma, unspecified, lymph nodes of head, face, and neck: Secondary | ICD-10-CM

## 2018-11-05 LAB — CMP (CANCER CENTER ONLY)
ALT: 8 U/L (ref 0–44)
AST: 12 U/L — ABNORMAL LOW (ref 15–41)
Albumin: 4 g/dL (ref 3.5–5.0)
Alkaline Phosphatase: 71 U/L (ref 38–126)
Anion gap: 12 (ref 5–15)
BUN: 29 mg/dL — ABNORMAL HIGH (ref 8–23)
CO2: 25 mmol/L (ref 22–32)
Calcium: 9.4 mg/dL (ref 8.9–10.3)
Chloride: 103 mmol/L (ref 98–111)
Creatinine: 0.96 mg/dL (ref 0.61–1.24)
GFR, Est AFR Am: >60 mL/min (ref >60)
GFR, Estimated: >60 mL/min (ref >60)
Glucose, Bld: 89 mg/dL (ref 70–99)
Potassium: 3.9 mmol/L (ref 3.5–5.1)
Sodium: 140 mmol/L (ref 135–145)
Total Bilirubin: 0.7 mg/dL (ref 0.3–1.2)
Total Protein: 7.3 g/dL (ref 6.5–8.1)

## 2018-11-05 LAB — CBC WITH DIFFERENTIAL/PLATELET
Abs Immature Granulocytes: 0.04 10*3/uL (ref 0.00–0.07)
Basophils Absolute: 0 10*3/uL (ref 0.0–0.1)
Basophils Relative: 0 %
Eosinophils Absolute: 0.1 10*3/uL (ref 0.0–0.5)
Eosinophils Relative: 1 %
HCT: 36.5 % — ABNORMAL LOW (ref 39.0–52.0)
Hemoglobin: 11.8 g/dL — ABNORMAL LOW (ref 13.0–17.0)
Immature Granulocytes: 1 %
Lymphocytes Relative: 15 %
Lymphs Abs: 1.2 10*3/uL (ref 0.7–4.0)
MCH: 28.2 pg (ref 26.0–34.0)
MCHC: 32.3 g/dL (ref 30.0–36.0)
MCV: 87.1 fL (ref 80.0–100.0)
Monocytes Absolute: 0.9 10*3/uL (ref 0.1–1.0)
Monocytes Relative: 11 %
Neutro Abs: 6.1 10*3/uL (ref 1.7–7.7)
Neutrophils Relative %: 72 %
Platelets: 282 10*3/uL (ref 150–400)
RBC: 4.19 MIL/uL — ABNORMAL LOW (ref 4.22–5.81)
RDW: 13.3 % (ref 11.5–15.5)
WBC: 8.4 10*3/uL (ref 4.0–10.5)
nRBC: 0 % (ref 0.0–0.2)

## 2018-11-05 MED ORDER — METHYLPREDNISOLONE SODIUM SUCC 125 MG IJ SOLR
125.0000 mg | Freq: Once | INTRAMUSCULAR | Status: AC
  Administered 2018-11-05: 125 mg via INTRAVENOUS

## 2018-11-05 MED ORDER — ALLOPURINOL 100 MG PO TABS: 100.0000 mg | ORAL_TABLET | Freq: Two times a day (BID) | ORAL | 0 refills | Status: DC

## 2018-11-05 MED ORDER — ONDANSETRON HCL 8 MG PO TABS: 8.0000 mg | ORAL_TABLET | Freq: Three times a day (TID) | ORAL | 0 refills | Status: DC | PRN

## 2018-11-05 MED ORDER — ACETAMINOPHEN 325 MG PO TABS
ORAL_TABLET | ORAL | Status: AC
  Filled 2018-11-05: qty 2

## 2018-11-05 MED ORDER — ACETAMINOPHEN 325 MG PO TABS
650.0000 mg | ORAL_TABLET | Freq: Once | ORAL | Status: AC
  Administered 2018-11-05: 650 mg via ORAL

## 2018-11-05 MED ORDER — DIPHENHYDRAMINE HCL 25 MG PO CAPS
50.0000 mg | ORAL_CAPSULE | Freq: Once | ORAL | Status: AC
  Administered 2018-11-05: 50 mg via ORAL

## 2018-11-05 MED ORDER — FAMOTIDINE IN NACL 20-0.9 MG/50ML-% IV SOLN
INTRAVENOUS | Status: AC
  Filled 2018-11-05: qty 50

## 2018-11-05 MED ORDER — DIPHENHYDRAMINE HCL 25 MG PO CAPS
ORAL_CAPSULE | ORAL | Status: AC
  Filled 2018-11-05: qty 2

## 2018-11-05 MED ORDER — FAMOTIDINE IN NACL 20-0.9 MG/50ML-% IV SOLN
20.0000 mg | Freq: Once | INTRAVENOUS | Status: AC
  Administered 2018-11-05: 20 mg via INTRAVENOUS

## 2018-11-05 MED ORDER — SODIUM CHLORIDE 0.9 % IV SOLN
375.0000 mg/m2 | Freq: Once | INTRAVENOUS | Status: AC
  Administered 2018-11-05: 700 mg via INTRAVENOUS
  Filled 2018-11-05: qty 50

## 2018-11-05 MED ORDER — SODIUM CHLORIDE 0.9 % IV SOLN
Freq: Once | INTRAVENOUS | Status: AC
  Administered 2018-11-05: 12:00:00 via INTRAVENOUS
  Filled 2018-11-05: qty 250

## 2018-11-05 MED ORDER — METHYLPREDNISOLONE SODIUM SUCC 125 MG IJ SOLR
INTRAMUSCULAR | Status: AC
  Filled 2018-11-05: qty 2

## 2018-11-05 NOTE — Patient Instructions (Signed)
[facility] Discharge Instructions for Patients Receiving Chemotherapy  Today you received the following chemotherapy agents Rituxan  To help prevent nausea and vomiting after your treatment, we encourage you to take your nausea medication as prescribed.   If you develop nausea and vomiting that is not controlled by your nausea medication, call the clinic.   BELOW ARE SYMPTOMS THAT SHOULD BE REPORTED IMMEDIATELY:  *FEVER GREATER THAN 100.5 F  *CHILLS WITH OR WITHOUT FEVER  NAUSEA AND VOMITING THAT IS NOT CONTROLLED WITH YOUR NAUSEA MEDICATION  *UNUSUAL SHORTNESS OF BREATH  *UNUSUAL BRUISING OR BLEEDING  TENDERNESS IN MOUTH AND THROAT WITH OR WITHOUT PRESENCE OF ULCERS  *URINARY PROBLEMS  *BOWEL PROBLEMS  UNUSUAL RASH Items with * indicate a potential emergency and should be followed up as soon as possible.  Feel free to call the clinic should you have any questions or concerns. The clinic phone number is [phone].  Please show the [facility] at check-in to the Emergency Department and triage nurse.  Rituximab injection (Rituxan) What is this medicine? RITUXIMAB (ri TUX i mab) is a monoclonal antibody. It is used to treat certain types of cancer like non-Hodgkin lymphoma and chronic lymphocytic leukemia. It is also used to treat rheumatoid arthritis, granulomatosis with polyangiitis (or Wegener's granulomatosis), microscopic polyangiitis, and pemphigus vulgaris. This medicine may be used for other purposes; ask your health care provider or pharmacist if you have questions. COMMON BRAND NAME(S): Rituxan, RUXIENCE What should I tell my health care provider before I take this medicine? They need to know if you have any of these conditions:  heart disease  infection (especially a virus infection such as hepatitis B, chickenpox, cold sores, or herpes)  immune system problems  irregular heartbeat  kidney disease  low blood counts, like low white  cell, platelet, or red cell counts  lung or breathing disease, like asthma  recently received or scheduled to receive a vaccine  an unusual or allergic reaction to rituximab, other medicines, foods, dyes, or preservatives  pregnant or trying to get pregnant  breast-feeding How should I use this medicine? This medicine is for infusion into a vein. It is administered in a hospital or clinic by a specially trained health care professional. A special MedGuide will be given to you by the pharmacist with each prescription and refill. Be sure to read this information carefully each time. Talk to your pediatrician regarding the use of this medicine in children. This medicine is not approved for use in children. Overdosage: If you think you have taken too much of this medicine contact a poison control center or emergency room at once. NOTE: This medicine is only for you. Do not share this medicine with others. What if I miss a dose? It is important not to miss a dose. Call your doctor or health care professional if you are unable to keep an appointment. What may interact with this medicine?  cisplatin  live virus vaccines This list may not describe all possible interactions. Give your health care provider a list of all the medicines, herbs, non-prescription drugs, or dietary supplements you use. Also tell them if you smoke, drink alcohol, or use illegal drugs. Some items may interact with your medicine. What should I watch for while using this medicine? Your condition will be monitored carefully while you are receiving this medicine. You may need blood work done while you are taking this medicine. This medicine can cause serious allergic reactions. To reduce your risk you  may need to take medicine before treatment with this medicine. Take your medicine as directed. In some patients, this medicine may cause a serious brain infection that may cause death. If you have any problems seeing, thinking,  speaking, walking, or standing, tell your healthcare professional right away. If you cannot reach your healthcare professional, urgently seek other source of medical care. Call your doctor or health care professional for advice if you get a fever, chills or sore throat, or other symptoms of a cold or flu. Do not treat yourself. This drug decreases your body's ability to fight infections. Try to avoid being around people who are sick. Do not become pregnant while taking this medicine or for at least 12 months after stopping it. Women should inform their doctor if they wish to become pregnant or think they might be pregnant. There is a potential for serious side effects to an unborn child. Talk to your health care professional or pharmacist for more information. Do not breast-feed an infant while taking this medicine or for at least 6 months after stopping it. What side effects may I notice from receiving this medicine? Side effects that you should report to your doctor or health care professional as soon as possible:  allergic reactions like skin rash, itching or hives; swelling of the face, lips, or tongue  breathing problems  chest pain  changes in vision  diarrhea  headache with fever, neck stiffness, sensitivity to light, nausea, or confusion  fast, irregular heartbeat  loss of memory  low blood counts - this medicine may decrease the number of white blood cells, red blood cells and platelets. You may be at increased risk for infections and bleeding.  mouth sores  problems with balance, talking, or walking  redness, blistering, peeling or loosening of the skin, including inside the mouth  signs of infection - fever or chills, cough, sore throat, pain or difficulty passing urine  signs and symptoms of kidney injury like trouble passing urine or change in the amount of urine  signs and symptoms of liver injury like dark yellow or brown urine; general ill feeling or flu-like  symptoms; light-colored stools; loss of appetite; nausea; right upper belly pain; unusually weak or tired; yellowing of the eyes or skin  signs and symptoms of low blood pressure like dizziness; feeling faint or lightheaded, falls; unusually weak or tired  stomach pain  swelling of the ankles, feet, hands  unusual bleeding or bruising  vomiting Side effects that usually do not require medical attention (report to your doctor or health care professional if they continue or are bothersome):  headache  joint pain  muscle cramps or muscle pain  nausea  tiredness This list may not describe all possible side effects. Call your doctor for medical advice about side effects. You may report side effects to FDA at 1-800-FDA-1088. Where should I keep my medicine? This drug is given in a hospital or clinic and will not be stored at home. NOTE: This sheet is a summary. It may not cover all possible information. If you have questions about this medicine, talk to your doctor, pharmacist, or health care provider.  2020 Elsevier/Gold Standard (2018-04-15 22:01:36)  Coronavirus (COVID-19) Are you at risk?  Are you at risk for the Coronavirus (COVID-19)?  To be considered HIGH RISK for Coronavirus (COVID-19), you have to meet the following criteria:  . Traveled to [place], [place], [place], [place] or [place]; or in the [place] to [place], [place], [place], or  [place]; and have fever, cough, and shortness of breath within the last 2 weeks of travel OR . Been in close contact with a person diagnosed with COVID-19 within the last 2 weeks and have fever, cough, and shortness of breath . IF YOU DO NOT MEET THESE CRITERIA, YOU ARE CONSIDERED LOW RISK FOR COVID-19.  What to do if you are HIGH RISK for COVID-19?  [name] If you are having a medical emergency, call 911. . Seek medical care right away. Before you go to a doctor's office, urgent care or emergency department, call ahead and tell  them about your recent travel, contact with someone diagnosed with COVID-19, and your symptoms. You should receive instructions from your physician's office regarding next steps of care.  . When you arrive at healthcare provider, tell the healthcare staff immediately you have returned from visiting [place], [place], [place], [place] or [place]; or traveled in the [place] to [place], [place], [place], or [place]; in the last two weeks or you have been in close contact with a person diagnosed with COVID-19 in the last 2 weeks.   . Tell the health care staff about your symptoms: fever, cough and shortness of breath. . After you have been seen by a medical provider, you will be either: o Tested for (COVID-19) and discharged home on quarantine except to seek medical care if symptoms worsen, and asked to  - Stay home and avoid contact with others until you get your results (4-5 days)  - Avoid travel on public transportation if possible (such as bus, train, or airplane) or o Sent to the Emergency Department by EMS for evaluation, COVID-19 testing, and possible admission depending on your condition and test results.  What to do if you are LOW RISK for COVID-19?  Reduce your risk of any infection by using the same precautions used for avoiding the common cold or flu:  [name] Wash your hands often with soap and warm water for at least 20 seconds.  If soap and water are not readily available, use an alcohol-based hand sanitizer with at least 60% alcohol.  . If coughing or sneezing, cover your mouth and nose by coughing or sneezing into the elbow areas of your shirt or coat, into a tissue or into your sleeve (not your hands). . Avoid shaking hands with others and consider head nods or verbal greetings only. . Avoid touching your eyes, nose, or mouth with unwashed hands.  . Avoid close contact with people who are sick. . Avoid places or events with large numbers of people in one location, like concerts or  sporting events. . Carefully consider travel plans you have or are making. . If you are planning any travel outside or inside the [place], visit the CDC's Travelers' Health webpage for the latest health notices. . If you have some symptoms but not all symptoms, continue to monitor at home and seek medical attention if your symptoms worsen. . If you are having a medical emergency, call 911.   [place] / e-Visit: [email or web address]         [facility]: [facility]: [phone]                   [facility]: [phone]

## 2018-11-05 NOTE — Telephone Encounter (Signed)
Per 8/20 los Plz add MD visit for toxicity check with C2 of Rituxan in 1 week.  Patient will get a print out after treatment.

## 2018-11-06 ENCOUNTER — Telehealth: Payer: Self-pay | Admitting: *Deleted

## 2018-11-06 NOTE — Telephone Encounter (Signed)
-----   Message from Rolene Course, RN sent at 11/05/2018 12:36 PM EDT ----- Regarding: Irene Limbo 1st Rituxan Tx F/U call [facility] 1st Rituxan Tx F/U call

## 2018-11-06 NOTE — Telephone Encounter (Signed)
Contacted patient - spoke with wife, she stated patient was mowing the grass. States he came home yesterday and said he felt great. Encouraged to contact office for questions or concerns. She verbalized understanding.

## 2018-11-10 NOTE — Addendum Note (Signed)
Addended by: Tora Kindred on: 11/10/2018 05:15 PM   Modules accepted: Orders

## 2018-11-10 NOTE — Progress Notes (Signed)
Switched pt to Rapid Rituxan. Pt tolerated 1st infusion and meets criteria. Kennith Center, Pharm.D., CPP 11/10/2018@5 :16 PM

## 2018-11-12 ENCOUNTER — Inpatient Hospital Stay: Payer: Medicare Other

## 2018-11-12 ENCOUNTER — Inpatient Hospital Stay: Payer: Medicare Other | Admitting: Hematology

## 2018-11-12 ENCOUNTER — Other Ambulatory Visit: Payer: Medicare Other

## 2018-11-12 ENCOUNTER — Encounter: Payer: Self-pay | Admitting: Hematology

## 2018-11-12 ENCOUNTER — Other Ambulatory Visit: Payer: Self-pay

## 2018-11-12 ENCOUNTER — Telehealth: Payer: Self-pay | Admitting: Hematology

## 2018-11-12 VITALS — BP 116/55 | HR 75 | Temp 99.1°F | Resp 18 | Ht 70.0 in | Wt 167.9 lb

## 2018-11-12 VITALS — BP 130/58 | HR 65 | Temp 97.7°F | Resp 18

## 2018-11-12 DIAGNOSIS — C8291 Follicular lymphoma, unspecified, lymph nodes of head, face, and neck: Secondary | ICD-10-CM

## 2018-11-12 DIAGNOSIS — C8208 Follicular lymphoma grade I, lymph nodes of multiple sites: Secondary | ICD-10-CM

## 2018-11-12 DIAGNOSIS — C8201 Follicular lymphoma grade I, lymph nodes of head, face, and neck: Secondary | ICD-10-CM | POA: Diagnosis not present

## 2018-11-12 DIAGNOSIS — Z5112 Encounter for antineoplastic immunotherapy: Secondary | ICD-10-CM | POA: Diagnosis not present

## 2018-11-12 DIAGNOSIS — Z7189 Other specified counseling: Secondary | ICD-10-CM

## 2018-11-12 LAB — CBC WITH DIFFERENTIAL/PLATELET
Abs Immature Granulocytes: 0.03 10*3/uL (ref 0.00–0.07)
Basophils Absolute: 0 10*3/uL (ref 0.0–0.1)
Basophils Relative: 0 %
Eosinophils Absolute: 0.1 10*3/uL (ref 0.0–0.5)
Eosinophils Relative: 1 %
HCT: 35.3 % — ABNORMAL LOW (ref 39.0–52.0)
Hemoglobin: 11.3 g/dL — ABNORMAL LOW (ref 13.0–17.0)
Immature Granulocytes: 0 %
Lymphocytes Relative: 14 %
Lymphs Abs: 1.1 10*3/uL (ref 0.7–4.0)
MCH: 28 pg (ref 26.0–34.0)
MCHC: 32 g/dL (ref 30.0–36.0)
MCV: 87.6 fL (ref 80.0–100.0)
Monocytes Absolute: 0.8 10*3/uL (ref 0.1–1.0)
Monocytes Relative: 10 %
Neutro Abs: 5.8 10*3/uL (ref 1.7–7.7)
Neutrophils Relative %: 75 %
Platelets: 337 10*3/uL (ref 150–400)
RBC: 4.03 MIL/uL — ABNORMAL LOW (ref 4.22–5.81)
RDW: 13.4 % (ref 11.5–15.5)
WBC: 7.8 10*3/uL (ref 4.0–10.5)
nRBC: 0 % (ref 0.0–0.2)

## 2018-11-12 LAB — CMP (CANCER CENTER ONLY)
ALT: 8 U/L (ref 0–44)
AST: 11 U/L — ABNORMAL LOW (ref 15–41)
Albumin: 3.7 g/dL (ref 3.5–5.0)
Alkaline Phosphatase: 64 U/L (ref 38–126)
Anion gap: 11 (ref 5–15)
BUN: 25 mg/dL — ABNORMAL HIGH (ref 8–23)
CO2: 25 mmol/L (ref 22–32)
Calcium: 9.3 mg/dL (ref 8.9–10.3)
Chloride: 105 mmol/L (ref 98–111)
Creatinine: 0.86 mg/dL (ref 0.61–1.24)
GFR, Est AFR Am: >60 mL/min (ref >60)
GFR, Estimated: >60 mL/min (ref >60)
Glucose, Bld: 90 mg/dL (ref 70–99)
Potassium: 3.6 mmol/L (ref 3.5–5.1)
Sodium: 141 mmol/L (ref 135–145)
Total Bilirubin: 0.6 mg/dL (ref 0.3–1.2)
Total Protein: 6.7 g/dL (ref 6.5–8.1)

## 2018-11-12 LAB — URIC ACID: Uric Acid, Serum: 5.5 mg/dL (ref 3.7–8.6)

## 2018-11-12 LAB — MAGNESIUM: Magnesium: 1.6 mg/dL — ABNORMAL LOW (ref 1.7–2.4)

## 2018-11-12 MED ORDER — ACETAMINOPHEN 325 MG PO TABS
650.0000 mg | ORAL_TABLET | Freq: Once | ORAL | Status: AC
  Administered 2018-11-12: 650 mg via ORAL

## 2018-11-12 MED ORDER — SODIUM CHLORIDE 0.9 % IV SOLN
375.0000 mg/m2 | Freq: Once | INTRAVENOUS | Status: AC
  Administered 2018-11-12: 14:00:00 700 mg via INTRAVENOUS
  Filled 2018-11-12: qty 20

## 2018-11-12 MED ORDER — DIPHENHYDRAMINE HCL 25 MG PO CAPS
50.0000 mg | ORAL_CAPSULE | Freq: Once | ORAL | Status: AC
  Administered 2018-11-12: 50 mg via ORAL

## 2018-11-12 MED ORDER — METHYLPREDNISOLONE SODIUM SUCC 125 MG IJ SOLR
125.0000 mg | Freq: Once | INTRAMUSCULAR | Status: AC
  Administered 2018-11-12: 125 mg via INTRAVENOUS

## 2018-11-12 MED ORDER — METHYLPREDNISOLONE SODIUM SUCC 125 MG IJ SOLR
INTRAMUSCULAR | Status: AC
  Filled 2018-11-12: qty 2

## 2018-11-12 MED ORDER — ACETAMINOPHEN 325 MG PO TABS
ORAL_TABLET | ORAL | Status: AC
  Filled 2018-11-12: qty 2

## 2018-11-12 MED ORDER — FAMOTIDINE IN NACL 20-0.9 MG/50ML-% IV SOLN
20.0000 mg | Freq: Once | INTRAVENOUS | Status: AC
  Administered 2018-11-12: 20 mg via INTRAVENOUS

## 2018-11-12 MED ORDER — SODIUM CHLORIDE 0.9 % IV SOLN
Freq: Once | INTRAVENOUS | Status: AC
  Administered 2018-11-12: 13:00:00 via INTRAVENOUS
  Filled 2018-11-12: qty 250

## 2018-11-12 MED ORDER — FAMOTIDINE IN NACL 20-0.9 MG/50ML-% IV SOLN
INTRAVENOUS | Status: AC
  Filled 2018-11-12: qty 50

## 2018-11-12 MED ORDER — DIPHENHYDRAMINE HCL 25 MG PO CAPS
ORAL_CAPSULE | ORAL | Status: AC
  Filled 2018-11-12: qty 2

## 2018-11-12 NOTE — Progress Notes (Signed)
Met with patient in lobby to introduce myself as Financial Resource Specialist and to offer available resources. ° °Discussed one-time $700 [facility] grant and qualifications to assist with personal expenses while going through treatment. ° °Gave him my card if interested in applying and for any additional financial questions or concerns. He verbalized understanding. °

## 2018-11-12 NOTE — Telephone Encounter (Signed)
Scheduled appt per 8/27 los.  Infusion nurse will give patient a new appt calendar

## 2018-11-12 NOTE — Progress Notes (Signed)
HEMATOLOGY/ONCOLOGY CONSULTATION NOTE  Date of Service: 11/12/2018  Patient Care Team: [name], MD as PCP - General (Family Medicine) [name], MD as PCP - Cardiology (Cardiology)  CHIEF COMPLAINTS/PURPOSE OF CONSULTATION:  mx of low grade Follicular Lymphoma  HISTORY OF PRESENTING ILLNESS:   Harold [name] is a wonderful 84 y.o. male who has been referred to [place] by Dr [name] for evaluation and management of Follicular Lymphoma. He is accompanied today by his sister. The pt reports that he is doing well overall.   The pt reports first noticing the rt neck mass develop in his neck about a year ago and saw his PCP Dr [name].  The mass was getting larger and so he was referred to Dr [name] for further evaluation and had a biopsy of the rt upper neck mass which showed Grade 1-2 follicular lymphoma. pt has had Tissue flow cytometry completed on 08/05/17 with results revealing A LAMBDA-RESTRICTED MONOCLONAL B-CELL POPULATION EXPRESSING CD10 COMPRISES 39% OF ALL LYMPHOCYTES.  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 He denies feeling any differently now than he did a year ago. He takes daily 81mg  aspirin which he notes has led to some mild increased bruising.   Of note prior to the patient's visit today,  He notes that he has had a couple spots of melanoma on his chin and back about 5 and 8 years ago. These were subsequently removed. He has had a basal cell as well and has been treated several times with liquid nitrogen.   On review of systems, pt reports stable energy levels, some bruising, stable bowel habits, and denies fevers, chills, night sweats, unexpected weight loss, pain along the spine, abdominal pains, testicular pain or swelling, and any other symptoms.   On PMHx the pt reports melanoma,  CAD, HTN, HLD, cataract in left eye.  Interval History:   Harold [name] returns today for management and evaluation of his follicular lymphoma. He is also here for a toxicity check after his second dose of weekly Rituxan x4. The patient's last visit with [place] was on 11/05/2018.The pt reports that he is doing well overall.  The pt reports that he tolerated his first dose of Rituxan well. He has noticed some leg swelling but denies leg pain, fatigue, nausea, belly pain, and any other symptoms. He has been eating well, staying hydrated, and elevating his legs. The leg swelling sometimes goes away overnight but sometimes does not.  Lab results today (11/12/2018) of CBC w/diff and CMP is as follows: all values are WNL except for RBC at 4.03, HGB at 11.3, HCT at 35.3, BUN at 25, AST at 11. 11/12/2018 Uric Acid at 5.5 11/12/2018 Magnesium at 1.6  On review of systems, pt reports leg swelling and denies leg pain, fatigue, belly pain, nausea, and any other symptoms.   MEDICAL HISTORY:  Past Medical History:  Diagnosis Date   BPH (benign prostatic hypertrophy)    CAD (coronary artery disease) 01/2010   S/P drug eluting stents LAD and PL branch of RCA Dr. Olevia Perches   follicular lymphoma dx'd 06/2017   Hernia    HLD (hyperlipidemia)    HTN (hypertension)     SURGICAL HISTORY: Past Surgical History:  Procedure Laterality Date   BACK SURGERY     HERNIA REPAIR  01/08/11   RIH  MASS EXCISION Right 08/05/2017   Procedure: EXCISION RIGHT POSTERIOR  NECK LYMPH NODE;  Surgeon: Rozetta Nunnery, MD;  Location: [place];  Service: ENT;  Laterality: Right;    SOCIAL HISTORY: Social History   Socioeconomic History   Marital status: Married    Spouse name: Not on file   Number of children: Not on file   Years of education: Not on file   Highest education level: Not on file  Occupational History   Not on file  Social Needs   Financial resource strain: Not on file    Food insecurity    Worry: Not on file    Inability: Not on file   Transportation needs    Medical: Not on file    Non-medical: Not on file  Tobacco Use   Smoking status: Never Smoker   Smokeless tobacco: Never Used  Substance and Sexual Activity   Alcohol use: No   Drug use: No   Sexual activity: Not on file  Lifestyle   Physical activity    Days per week: Not on file    Minutes per session: Not on file   Stress: Not on file  Relationships   Social connections    Talks on phone: Not on file    Gets together: Not on file    Attends religious service: Not on file    Active member of club or organization: Not on file    Attends meetings of clubs or organizations: Not on file    Relationship status: Not on file   Intimate partner violence    Fear of current or ex partner: Not on file    Emotionally abused: Not on file    Physically abused: Not on file    Forced sexual activity: Not on file  Other Topics Concern   Not on file  Social History Narrative   Married    FAMILY HISTORY: Family History  Problem Relation Age of Onset   Heart failure Mother     ALLERGIES:  has No Known Allergies.  MEDICATIONS:  Current Outpatient Medications  Medication Sig Dispense Refill   allopurinol (ZYLOPRIM) 100 MG tablet Take 1 tablet (100 mg total) by mouth 2 (two) times daily. 30 tablet 0   aspirin 81 MG tablet Take 81 mg by mouth daily.       atenolol (TENORMIN) 25 MG tablet Take 1 tablet (25 mg total) by mouth daily. 90 tablet 3   brimonidine (ALPHAGAN) 0.2 % ophthalmic solution Place 1 drop into the right eye 2 (two) times daily.       cholecalciferol (VITAMIN D) 1000 UNITS tablet Take 1,000 Units by mouth daily.       finasteride (PROSCAR) 5 MG tablet Take 5 mg by mouth daily.       hydrochlorothiazide (HYDRODIURIL) 25 MG tablet Take 1 tablet (25 mg total) by mouth daily. 90 tablet 3   isosorbide dinitrate (ISORDIL) 5 MG tablet Take 1 tablet (5 mg total) by  mouth 2 (two) times daily. 180 tablet 3   lisinopril (PRINIVIL,ZESTRIL) 10 MG tablet Take 1 tablet (10 mg total) by mouth daily. 90 tablet 3   nitroGLYCERIN (NITROSTAT) 0.4 MG SL tablet Place 1 tablet (0.4 mg total) under the tongue every 5 (five) minutes as needed for chest pain. 25 tablet 6   ondansetron (ZOFRAN) 8 MG tablet Take 1 tablet (8 mg total) by mouth every 8 (eight) hours as needed for nausea. 30 tablet 0   pravastatin (PRAVACHOL)  40 MG tablet Take 1 tablet (40 mg total) by mouth at bedtime. 90 tablet 3   timolol (TIMOPTIC) 0.5 % ophthalmic solution Place 1 drop into the right eye daily.       No current facility-administered medications for this visit.     REVIEW OF SYSTEMS:    A 10+ POINT REVIEW OF SYSTEMS WAS OBTAINED including neurology, dermatology, psychiatry, cardiac, respiratory, lymph, extremities, GI, GU, Musculoskeletal, constitutional, breasts, reproductive, HEENT.  All pertinent positives are noted in the HPI.  All others are negative.   PHYSICAL EXAMINATION: ECOG PERFORMANCE STATUS: 1 - Symptomatic but completely ambulatory  Vitals:   11/12/18 1051  BP: (!) 116/55  Pulse: 75  Resp: 18  Temp: 99.1 F (37.3 C)  SpO2: 100%   Filed Weights   11/12/18 1051  Weight: 167 lb 14.4 oz (76.2 kg)   .Body mass index is 24.09 kg/m.   GENERAL:alert, in no acute distress and comfortable SKIN: no acute rashes, no significant lesions EYES: conjunctiva are pink and non-injected, sclera anicteric OROPHARYNX: MMM, no exudates, no oropharyngeal erythema or ulceration NECK: supple, no JVD LYMPH:  Small palpable left pre-auricular lymph node. Minimally palpable LNadenopathy in b/l cervical, all less than 1cm in size. 3-4cm left axillary LN. Right axillary LN has decreased in size. LUNGS: clear to auscultation b/l with normal respiratory effort HEART: regular rate & rhythm ABDOMEN:  normoactive bowel sounds , non tender, not distended. No palpable hepatosplenomegaly.    Extremity: improved pedal edema, worse on right side PSYCH: alert & oriented x 3 with fluent speech NEURO: no focal motor/sensory deficits   LABORATORY DATA:  I have reviewed the data as listed  . CBC Latest Ref Rng & Units 11/05/2018 10/22/2018 06/22/2018  WBC 4.0 - 10.5 K/uL 8.4 6.5 6.9  Hemoglobin 13.0 - 17.0 g/dL 11.8(L) 11.4(L) 12.7(L)  Hematocrit 39.0 - 52.0 % 36.5(L) 36.1(L) 40.1  Platelets 150 - 400 K/uL 282 207 233    . CMP Latest Ref Rng & Units 11/05/2018 10/22/2018 06/22/2018  Glucose 70 - 99 mg/dL 89 94 98  BUN 8 - 23 mg/dL 29(H) 25(H) 27(H)  Creatinine 0.61 - 1.24 mg/dL 0.96 1.00 1.02  Sodium 135 - 145 mmol/L 140 141 142  Potassium 3.5 - 5.1 mmol/L 3.9 3.8 3.6  Chloride 98 - 111 mmol/L 103 105 105  CO2 22 - 32 mmol/L 25 25 26   Calcium 8.9 - 10.3 mg/dL 9.4 9.4 9.6  Total Protein 6.5 - 8.1 g/dL 7.3 6.6 7.0  Total Bilirubin 0.3 - 1.2 mg/dL 0.7 0.8 1.0  Alkaline Phos 38 - 126 U/L 71 58 55  AST 15 - 41 U/L 12(L) 13(L) 16  ALT 0 - 44 U/L 8 10 12    08/08/17 Pathology:   08/05/17 Pathology:   RADIOGRAPHIC STUDIES: I have personally reviewed the radiological images as listed and agreed with the findings in the report. Ct Soft Tissue Neck W Contrast  Result Date: 11/04/2018 CLINICAL DATA:  Follicular lymphoma, stage III/IV EXAM: CT NECK WITH CONTRAST TECHNIQUE: Multidetector CT imaging of the neck was performed using the standard protocol following the bolus administration of intravenous contrast. CONTRAST:  151mL OMNIPAQUE IOHEXOL 300 MG/ML  SOLN COMPARISON:  PET-CT 09/02/2017 FINDINGS: Pharynx and larynx: No appreciable mass or swelling. Salivary glands: Bilateral intraparotid masses are new from prior exam and likely reflect lymphadenopathy. On the right these measure up to 1.3 cm in short axis. On the left these measure up to 1.4 cm in short axis. The submandibular glands  are unremarkable. Thyroid: Heterogeneous enhancement within the inferior left thyroid lobe may be related  to beam hardening artifact from a dense left-sided contrast bolus. Otherwise unremarkable. Lymph nodes: There has been significant interval progression of cervical lymphadenopathy with now diffuse bilateral cervical lymphadenopathy at essentially all nodal stations. An index large nodal conglomerate on the right at the level 5 station measures 3.9 x 2.6 cm in transaxial dimensions (series 604, image 28). An index node within the lower left neck immediately lateral to the left common carotid artery measures 1.8 cm in short axis (series 603, image 42). Vascular: Soft and calcified plaque at the bilateral carotid bifurcations (greater on the right). Bulky right-sided lymphadenopathy causes multifocal narrowing of the right internal jugular vein. Significant focal narrowing of the left internal jugular vein within the mid to lower left neck, without significant mass effect at this level. Limited intracranial: Unremarkable Visualized orbits: Unremarkable Mastoids and visualized paranasal sinuses: Imaged portions demonstrate no significant paranasal sinus disease or mastoid effusion. Skeleton: Cervical spondylosis without high-grade bony spinal canal stenosis. C7-T1 grade 1 anterolisthesis. No suspicious lytic or blastic osseous lesions. Upper chest: Please refer to concurrent chest CT for a description of findings below the level of the thoracic inlet. IMPRESSION: Marked interval progression of cervical lymphadenopathy since PET-CT 09/02/2017, now with lymphadenopathy present bilaterally at essentially all stations. Index nodes as described. This includes bilateral intraparotid lymphadenopathy. Bulky right-sided adenopathy results in multifocal narrowing of the right internal jugular vein. Significant narrowing of the mid to lower left internal jugular vein of unclear cause as there is no significant mass effect at this level. Electronically Signed   By: Kellie Simmering   On: 11/04/2018 10:16   Ct Chest W  Contrast  Result Date: 11/03/2018 CLINICAL DATA:  Followup lymphoma. EXAM: CT CHEST, ABDOMEN, AND PELVIS WITH CONTRAST TECHNIQUE: Multidetector CT imaging of the chest, abdomen and pelvis was performed following the standard protocol during bolus administration of intravenous contrast. CONTRAST:  159mL OMNIPAQUE IOHEXOL 300 MG/ML  SOLN COMPARISON:  03/20/2018. FINDINGS: CT CHEST FINDINGS Cardiovascular: Normal heart size. Aortic atherosclerosis. Calcification in the LAD, left circumflex and RCA coronary arteries identified. Mediastinum/Nodes: Normal appearance of the thyroid gland. The trachea appears patent and is midline. Normal appearance of the esophagus. Persistent and progressive axillary adenopathy. Index left axillary node measures 5.5 x 2.8 cm, image 19/2. Previously 3.0 x 2.5 cm. Multiple new left axillary lymph nodes are identified many of which have a central area of low attenuation compatible with necrosis. Index node measures 2.6 x 2.0 cm, image 18/2. New from previous exam. New bilateral supraclavicular adenopathy. Index left supraclavicular node measures 1.8 x 1.4 cm, image 11/2. Progressive right axillary adenopathy noted including 2.8 x 2.4 cm node, image 15/2. Previously this measured 1.3 x 0.8 cm. New right axillary lymph node measures 5 by 2 cm, image 21/2. New bilateral anterior posterior lower cervical the adenopathy. Left posterior cervical node measures 1.7 x 1.1 cm, image 3/2. Right posterior cervical node measures 1.5 x 1.3 cm. Within the posterior mediastinum there are multiple enlarged lymph nodes which are new from previous exam. Lymph node posterior to the esophagus has a short axis of 1.4 cm, image 33/2. Lymph node between the descending aorta and esophagus measures 1.5 x 1.7 cm, image 40/2. Lungs/Pleura: No pleural effusion identified. Mild chronic diffuse interstitial reticulation is identified. No airspace consolidation, atelectasis or pneumothorax. Musculoskeletal: No chest wall  mass or suspicious bone lesions identified. CT ABDOMEN PELVIS FINDINGS Hepatobiliary: No focal liver  abnormality is seen. No gallstones, gallbladder wall thickening, or biliary dilatation. Pancreas: Diffuse pancreatic calcifications are identified compatible with chronic pancreatitis. No acute inflammation, main duct dilatation or mass. Spleen: Normal in size without focal abnormality. Adrenals/Urinary Tract: Normal appearance of the adrenal glands. No kidney mass or hydronephrosis. Stones within the urinary bladder are identified measuring up to 6 mm. Within the distal left ureter there is a calcification measuring 6 mm. Stomach/Bowel: Stomach normal. The small bowel loops have a normal course and caliber. No pathologic dilatation of the colon. Vascular/Lymphatic: No aneurysm. Mild aortic atherosclerosis. Interval progression of retroperitoneal and pelvic adenopathy. Index left retroperitoneal node measures 1.5 cm, image 82/2. New from previous exam. Retrocaval lymph node measures 1.7 cm, image 77/2. Previously 0.5 cm. Right external iliac node measures 5.6 by 3.3 cm, image 111/2. Previously 3.8 by 2.7 cm. Left external iliac node measures 2.5 x 1.6 cm, image 110/2. New from previous exam. Right inguinal node measures 4.4 x 3.0 cm, image 120/2. Previously 3.9 by 2.2 cm. Reproductive: Prostate is unremarkable. Other: No abdominal wall hernia or abnormality. No abdominopelvic ascites. Musculoskeletal: No acute or significant osseous findings. IMPRESSION: 1. Progression of bilateral axillary, retroperitoneal, pelvic, and inguinal adenopathy. New anterior and posterior lower cervical adenopathy identified within the neck. 2. Persistent distal left ureteral calculus. Small bladder calculi are unchanged. 3. Aortic Atherosclerosis (ICD10-I70.0). Coronary artery calcifications. 4. Sequelae of chronic pancreatitis noted. Electronically Signed   By: Kerby Moors M.D.   On: 11/03/2018 14:17   Ct Abdomen Pelvis W  Contrast  Result Date: 11/03/2018 CLINICAL DATA:  Followup lymphoma. EXAM: CT CHEST, ABDOMEN, AND PELVIS WITH CONTRAST TECHNIQUE: Multidetector CT imaging of the chest, abdomen and pelvis was performed following the standard protocol during bolus administration of intravenous contrast. CONTRAST:  134mL OMNIPAQUE IOHEXOL 300 MG/ML  SOLN COMPARISON:  03/20/2018. FINDINGS: CT CHEST FINDINGS Cardiovascular: Normal heart size. Aortic atherosclerosis. Calcification in the LAD, left circumflex and RCA coronary arteries identified. Mediastinum/Nodes: Normal appearance of the thyroid gland. The trachea appears patent and is midline. Normal appearance of the esophagus. Persistent and progressive axillary adenopathy. Index left axillary node measures 5.5 x 2.8 cm, image 19/2. Previously 3.0 x 2.5 cm. Multiple new left axillary lymph nodes are identified many of which have a central area of low attenuation compatible with necrosis. Index node measures 2.6 x 2.0 cm, image 18/2. New from previous exam. New bilateral supraclavicular adenopathy. Index left supraclavicular node measures 1.8 x 1.4 cm, image 11/2. Progressive right axillary adenopathy noted including 2.8 x 2.4 cm node, image 15/2. Previously this measured 1.3 x 0.8 cm. New right axillary lymph node measures 5 by 2 cm, image 21/2. New bilateral anterior posterior lower cervical the adenopathy. Left posterior cervical node measures 1.7 x 1.1 cm, image 3/2. Right posterior cervical node measures 1.5 x 1.3 cm. Within the posterior mediastinum there are multiple enlarged lymph nodes which are new from previous exam. Lymph node posterior to the esophagus has a short axis of 1.4 cm, image 33/2. Lymph node between the descending aorta and esophagus measures 1.5 x 1.7 cm, image 40/2. Lungs/Pleura: No pleural effusion identified. Mild chronic diffuse interstitial reticulation is identified. No airspace consolidation, atelectasis or pneumothorax. Musculoskeletal: No chest wall  mass or suspicious bone lesions identified. CT ABDOMEN PELVIS FINDINGS Hepatobiliary: No focal liver abnormality is seen. No gallstones, gallbladder wall thickening, or biliary dilatation. Pancreas: Diffuse pancreatic calcifications are identified compatible with chronic pancreatitis. No acute inflammation, main duct dilatation or mass. Spleen: Normal in  size without focal abnormality. Adrenals/Urinary Tract: Normal appearance of the adrenal glands. No kidney mass or hydronephrosis. Stones within the urinary bladder are identified measuring up to 6 mm. Within the distal left ureter there is a calcification measuring 6 mm. Stomach/Bowel: Stomach normal. The small bowel loops have a normal course and caliber. No pathologic dilatation of the colon. Vascular/Lymphatic: No aneurysm. Mild aortic atherosclerosis. Interval progression of retroperitoneal and pelvic adenopathy. Index left retroperitoneal node measures 1.5 cm, image 82/2. New from previous exam. Retrocaval lymph node measures 1.7 cm, image 77/2. Previously 0.5 cm. Right external iliac node measures 5.6 by 3.3 cm, image 111/2. Previously 3.8 by 2.7 cm. Left external iliac node measures 2.5 x 1.6 cm, image 110/2. New from previous exam. Right inguinal node measures 4.4 x 3.0 cm, image 120/2. Previously 3.9 by 2.2 cm. Reproductive: Prostate is unremarkable. Other: No abdominal wall hernia or abnormality. No abdominopelvic ascites. Musculoskeletal: No acute or significant osseous findings. IMPRESSION: 1. Progression of bilateral axillary, retroperitoneal, pelvic, and inguinal adenopathy. New anterior and posterior lower cervical adenopathy identified within the neck. 2. Persistent distal left ureteral calculus. Small bladder calculi are unchanged. 3. Aortic Atherosclerosis (ICD10-I70.0). Coronary artery calcifications. 4. Sequelae of chronic pancreatitis noted. Electronically Signed   By: Kerby Moors M.D.   On: 11/03/2018 14:17    ASSESSMENT & PLAN:   84  y.o. male with  1.Recently diagnosed atleast Stage III Follicular Lymphoma Grade 1-2 presenting with rt cervical lymphadenopathy  08/05/17 pathology results of right cervical LN revealing low grade follicular lymphoma  [id number] PET revealed Hypermetabolic lymphadenopathy involving the right neck, bilateral axillary, right hilar and right inguinal lymph nodes. No abdominal or pelvic involvement.    03/20/18 CT C/A/P revealed Progressive bilateral axillary and right inguinal lymphadenopathy, including necrotic lymph nodes in the right axilla, as above. Spleen is normal in size. 4 mm distal left ureteral calculus, unchanged. No hydronephrosis. Stable layering bladder calculi measuring up to 5 mm. Cholelithiasis, without associated inflammatory changes. Stable sequela of prior/chronic pancreatitis.   11/03/2018 CT neck revealed "Marked interval progression of cervical lymphadenopathy since PET-CT 09/02/2017, now with lymphadenopathy present bilaterally at essentially all stations. Index nodes as described. This includes bilateral intraparotid lymphadenopathy. Bulky right-sided adenopathy results in multifocal narrowing of the right internal jugular vein. Significant narrowing of the mid to lower left internal jugular vein of unclear cause as there is no significant mass effect at this level."  11/03/2018 CT CAP revealed "1. Progression of bilateral axillary, retroperitoneal, pelvic, and inguinal adenopathy. New anterior and posterior lower cervical adenopathy identified within the neck. 2. Persistent distal left ureteral calculus. Small bladder calculi are unchanged. 3. Aortic Atherosclerosis (ICD10-I70.0). Coronary artery calcifications. 4. Sequelae of chronic pancreatitis noted."  2. 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 3. H/o head and neck Basal cell carcinoma Continue routine skin checks with dermatologist    PLAN: -Discussed pt labwork today, 11/12/2018 -Discussed that his lymphoma is slow growing  and that the goal of treatment at this time is to manage his symptoms. Clinically he appears to have already had some decrease in size of his cervical and axillary LN's -Pt is here today for his 2nd dose of Rituxan weekly x4. He tolerated his first dose well without any prohibitive toxicities. Would not recommend chemotherapy at this time but may consider in the future.   -Hep B core antibody positive, hep b surface antigen negative. Hep B DNA PCR neg, Hep C antibody normal at <0.1 -Continue Allopurinol and Zofran -Will  give pt influenza vaccine with last dose of Rituxan -Will see the pt back with his 4th dose of Rituxan  F/u for 3rd dose of Rituxan with labs on 9/3 as scheduled Plz switch MD visit from 9/3 to 9/10 with 4th doses of rituxan with labs   All of the patients questions were answered with apparent satisfaction. The patient knows to call the clinic with any problems, questions or concerns.  The total time spent in the appt was 25 minutes and more than 50% was on counseling and direct patient cares.  Sullivan Lone MD MS AAHIVMS [facility] Hematology/Oncology Physician [facility]  (Office):       [phone] (Work cell):  [phone] (Fax):           [phone]  11/12/2018 8:57 AM  I, De Burrs, am acting as a scribe for Dr. Irene Limbo  .I have reviewed the above documentation for accuracy and completeness, and I agree with the above. Brunetta Genera MD

## 2018-11-12 NOTE — Patient Instructions (Signed)
[facility] Discharge Instructions for Patients Receiving Chemotherapy  Today you received the following chemotherapy agents:  Rituxan.  To help prevent nausea and vomiting after your treatment, we encourage you to take your nausea medication as directed.   If you develop nausea and vomiting that is not controlled by your nausea medication, call the clinic.   BELOW ARE SYMPTOMS THAT SHOULD BE REPORTED IMMEDIATELY:  *FEVER GREATER THAN 100.5 F  *CHILLS WITH OR WITHOUT FEVER  NAUSEA AND VOMITING THAT IS NOT CONTROLLED WITH YOUR NAUSEA MEDICATION  *UNUSUAL SHORTNESS OF BREATH  *UNUSUAL BRUISING OR BLEEDING  TENDERNESS IN MOUTH AND THROAT WITH OR WITHOUT PRESENCE OF ULCERS  *URINARY PROBLEMS  *BOWEL PROBLEMS  UNUSUAL RASH Items with * indicate a potential emergency and should be followed up as soon as possible.  Feel free to call the clinic should you have any questions or concerns. The clinic phone number is [phone].  Please show the CHEMO ALERT CARD at check-in to the Emergency Department and triage nurse.   

## 2018-11-12 NOTE — Progress Notes (Unsigned)
Me

## 2018-11-19 ENCOUNTER — Inpatient Hospital Stay: Payer: Medicare Other

## 2018-11-19 ENCOUNTER — Other Ambulatory Visit: Payer: Self-pay | Admitting: Hematology

## 2018-11-19 ENCOUNTER — Inpatient Hospital Stay: Payer: Medicare Other | Attending: Hematology

## 2018-11-19 ENCOUNTER — Other Ambulatory Visit: Payer: Self-pay

## 2018-11-19 ENCOUNTER — Ambulatory Visit: Payer: Medicare Other | Admitting: Hematology

## 2018-11-19 VITALS — BP 124/54 | HR 68 | Temp 97.7°F | Resp 18

## 2018-11-19 DIAGNOSIS — Z5111 Encounter for antineoplastic chemotherapy: Secondary | ICD-10-CM | POA: Diagnosis not present

## 2018-11-19 DIAGNOSIS — Z79899 Other long term (current) drug therapy: Secondary | ICD-10-CM | POA: Insufficient documentation

## 2018-11-19 DIAGNOSIS — D649 Anemia, unspecified: Secondary | ICD-10-CM | POA: Diagnosis not present

## 2018-11-19 DIAGNOSIS — Z85820 Personal history of malignant melanoma of skin: Secondary | ICD-10-CM | POA: Insufficient documentation

## 2018-11-19 DIAGNOSIS — C8201 Follicular lymphoma grade I, lymph nodes of head, face, and neck: Secondary | ICD-10-CM | POA: Diagnosis present

## 2018-11-19 DIAGNOSIS — C8208 Follicular lymphoma grade I, lymph nodes of multiple sites: Secondary | ICD-10-CM

## 2018-11-19 DIAGNOSIS — Z5112 Encounter for antineoplastic immunotherapy: Secondary | ICD-10-CM

## 2018-11-19 DIAGNOSIS — Z7189 Other specified counseling: Secondary | ICD-10-CM

## 2018-11-19 LAB — CBC WITH DIFFERENTIAL/PLATELET
Abs Immature Granulocytes: 0.07 10*3/uL (ref 0.00–0.07)
Basophils Absolute: 0 10*3/uL (ref 0.0–0.1)
Basophils Relative: 0 %
Eosinophils Absolute: 0.1 10*3/uL (ref 0.0–0.5)
Eosinophils Relative: 1 %
HCT: 36.7 % — ABNORMAL LOW (ref 39.0–52.0)
Hemoglobin: 11.9 g/dL — ABNORMAL LOW (ref 13.0–17.0)
Immature Granulocytes: 1 %
Lymphocytes Relative: 15 %
Lymphs Abs: 1.3 10*3/uL (ref 0.7–4.0)
MCH: 27.9 pg (ref 26.0–34.0)
MCHC: 32.4 g/dL (ref 30.0–36.0)
MCV: 86.2 fL (ref 80.0–100.0)
Monocytes Absolute: 1.1 10*3/uL — ABNORMAL HIGH (ref 0.1–1.0)
Monocytes Relative: 12 %
Neutro Abs: 6.2 10*3/uL (ref 1.7–7.7)
Neutrophils Relative %: 71 %
Platelets: 269 10*3/uL (ref 150–400)
RBC: 4.26 MIL/uL (ref 4.22–5.81)
RDW: 13.8 % (ref 11.5–15.5)
WBC: 8.8 10*3/uL (ref 4.0–10.5)
nRBC: 0 % (ref 0.0–0.2)

## 2018-11-19 LAB — CMP (CANCER CENTER ONLY)
ALT: 7 U/L (ref 0–44)
AST: 11 U/L — ABNORMAL LOW (ref 15–41)
Albumin: 3.9 g/dL (ref 3.5–5.0)
Alkaline Phosphatase: 59 U/L (ref 38–126)
Anion gap: 10 (ref 5–15)
BUN: 27 mg/dL — ABNORMAL HIGH (ref 8–23)
CO2: 25 mmol/L (ref 22–32)
Calcium: 9.3 mg/dL (ref 8.9–10.3)
Chloride: 105 mmol/L (ref 98–111)
Creatinine: 0.91 mg/dL (ref 0.61–1.24)
GFR, Est AFR Am: >60 mL/min (ref >60)
GFR, Estimated: >60 mL/min (ref >60)
Glucose, Bld: 91 mg/dL (ref 70–99)
Potassium: 3.6 mmol/L (ref 3.5–5.1)
Sodium: 140 mmol/L (ref 135–145)
Total Bilirubin: 0.6 mg/dL (ref 0.3–1.2)
Total Protein: 6.7 g/dL (ref 6.5–8.1)

## 2018-11-19 LAB — URIC ACID: Uric Acid, Serum: 5.2 mg/dL (ref 3.7–8.6)

## 2018-11-19 MED ORDER — ACETAMINOPHEN 325 MG PO TABS
ORAL_TABLET | ORAL | Status: AC
  Filled 2018-11-19: qty 2

## 2018-11-19 MED ORDER — ACETAMINOPHEN 325 MG PO TABS
650.0000 mg | ORAL_TABLET | Freq: Once | ORAL | Status: AC
  Administered 2018-11-19: 650 mg via ORAL

## 2018-11-19 MED ORDER — SODIUM CHLORIDE 0.9 % IV SOLN
375.0000 mg/m2 | Freq: Once | INTRAVENOUS | Status: AC
  Administered 2018-11-19: 700 mg via INTRAVENOUS
  Filled 2018-11-19: qty 50

## 2018-11-19 MED ORDER — FAMOTIDINE IN NACL 20-0.9 MG/50ML-% IV SOLN
INTRAVENOUS | Status: AC
  Filled 2018-11-19: qty 50

## 2018-11-19 MED ORDER — METHYLPREDNISOLONE SODIUM SUCC 125 MG IJ SOLR
125.0000 mg | Freq: Once | INTRAMUSCULAR | Status: AC
  Administered 2018-11-19: 125 mg via INTRAVENOUS

## 2018-11-19 MED ORDER — METHYLPREDNISOLONE SODIUM SUCC 125 MG IJ SOLR
INTRAMUSCULAR | Status: AC
  Filled 2018-11-19: qty 2

## 2018-11-19 MED ORDER — FAMOTIDINE IN NACL 20-0.9 MG/50ML-% IV SOLN
20.0000 mg | Freq: Once | INTRAVENOUS | Status: AC
  Administered 2018-11-19: 20 mg via INTRAVENOUS

## 2018-11-19 MED ORDER — DIPHENHYDRAMINE HCL 25 MG PO CAPS
ORAL_CAPSULE | ORAL | Status: AC
  Filled 2018-11-19: qty 2

## 2018-11-19 MED ORDER — DIPHENHYDRAMINE HCL 25 MG PO CAPS
50.0000 mg | ORAL_CAPSULE | Freq: Once | ORAL | Status: AC
  Administered 2018-11-19: 10:00:00 50 mg via ORAL

## 2018-11-19 MED ORDER — SODIUM CHLORIDE 0.9 % IV SOLN
Freq: Once | INTRAVENOUS | Status: AC
  Administered 2018-11-19: 10:00:00 via INTRAVENOUS
  Filled 2018-11-19: qty 250

## 2018-11-19 NOTE — Patient Instructions (Signed)
Coronavirus (COVID-19) Are you at risk?  Are you at risk for the Coronavirus (COVID-19)?  To be considered HIGH RISK for Coronavirus (COVID-19), you have to meet the following criteria:  . Traveled to [place], [place], [place], [place] or [place]; or in the [place] to [place], [place], [place], or [place]; and have fever, cough, and shortness of breath within the last 2 weeks of travel OR . Been in close contact with a person diagnosed with COVID-19 within the last 2 weeks and have fever, cough, and shortness of breath . IF YOU DO NOT MEET THESE CRITERIA, YOU ARE CONSIDERED LOW RISK FOR COVID-19.  What to do if you are HIGH RISK for COVID-19?  . If you are having a medical emergency, call 911. . Seek medical care right away. Before you go to a doctor's office, urgent care or emergency department, call ahead and tell them about your recent travel, contact with someone diagnosed with COVID-19, and your symptoms. You should receive instructions from your physician's office regarding next steps of care.  . When you arrive at healthcare provider, tell the healthcare staff immediately you have returned from visiting [place], [place], [place], [place] or [place]; or traveled in the [place] to [place], [place], [place], or [place]; in the last two weeks or you have been in close contact with a person diagnosed with COVID-19 in the last 2 weeks.   . Tell the health care staff about your symptoms: fever, cough and shortness of breath. . After you have been seen by a medical provider, you will be either: o Tested for (COVID-19) and discharged home on quarantine except to seek medical care if symptoms worsen, and asked to  - Stay home and avoid contact with others until you get your results (4-5 days)  - Avoid travel on public transportation if possible (such as bus, train, or airplane) or o Sent to the Emergency Department by EMS for evaluation, COVID-19 testing, and possible  admission depending on your condition and test results.  What to do if you are LOW RISK for COVID-19?  Reduce your risk of any infection by using the same precautions used for avoiding the common cold or flu:  . Wash your hands often with soap and warm water for at least 20 seconds.  If soap and water are not readily available, use an alcohol-based hand sanitizer with at least 60% alcohol.  . If coughing or sneezing, cover your mouth and nose by coughing or sneezing into the elbow areas of your shirt or coat, into a tissue or into your sleeve (not your hands). . Avoid shaking hands with others and consider head nods or verbal greetings only. . Avoid touching your eyes, nose, or mouth with unwashed hands.  . Avoid close contact with people who are sick. . Avoid places or events with large numbers of people in one location, like concerts or sporting events. . Carefully consider travel plans you have or are making. . If you are planning any travel outside or inside the [place], visit the CDC's Travelers' Health webpage for the latest health notices. . If you have some symptoms but not all symptoms, continue to monitor at home and seek medical attention if your symptoms worsen. . If you are having a medical emergency, call 911.   ADDITIONAL HEALTHCARE OPTIONS FOR PATIENTS  [facility] / e-Visit: [email or web address]         [facility]: [phone]  [facility]: [phone]                   [facility]: [phone]   [facility] Discharge Instructions for Patients Receiving Chemotherapy  Today you received the following chemotherapy agents Rituxan  To help prevent nausea and vomiting after your treatment, we encourage you to take your nausea medication as directed.    If you develop nausea and vomiting that is not controlled by your nausea medication, call the clinic.   BELOW ARE  SYMPTOMS THAT SHOULD BE REPORTED IMMEDIATELY:  *FEVER GREATER THAN 100.5 F  *CHILLS WITH OR WITHOUT FEVER  NAUSEA AND VOMITING THAT IS NOT CONTROLLED WITH YOUR NAUSEA MEDICATION  *UNUSUAL SHORTNESS OF BREATH  *UNUSUAL BRUISING OR BLEEDING  TENDERNESS IN MOUTH AND THROAT WITH OR WITHOUT PRESENCE OF ULCERS  *URINARY PROBLEMS  *BOWEL PROBLEMS  UNUSUAL RASH Items with * indicate a potential emergency and should be followed up as soon as possible.  Feel free to call the clinic should you have any questions or concerns. The clinic phone number is [phone].  Please show the CHEMO ALERT CARD at check-in to the Emergency Department and triage nurse.   

## 2018-11-25 NOTE — Progress Notes (Signed)
HEMATOLOGY/ONCOLOGY CLINIC NOTE  Date of Service: 11/26/2018  Patient Care Team: [name], MD as PCP - General (Family Medicine) [name], MD as PCP - Cardiology (Cardiology)  CHIEF COMPLAINTS/PURPOSE OF CONSULTATION:  mx of low grade Follicular Lymphoma  HISTORY OF PRESENTING ILLNESS:   Harold [name] is a wonderful 84 y.o. male who has been referred to [place] by Dr [name] for evaluation and management of Follicular Lymphoma. He is accompanied today by his sister. The pt reports that he is doing well overall.   The pt reports first noticing the rt neck mass develop in his neck about a year ago and saw his PCP Dr [name].  The mass was getting larger and so he was referred to Dr [name] for further evaluation and had a biopsy of the rt upper neck mass which showed Grade 1-2 follicular lymphoma. pt has had Tissue flow cytometry completed on 08/05/17 with results revealing A LAMBDA-RESTRICTED MONOCLONAL B-CELL POPULATION EXPRESSING CD10 COMPRISES 39% OF ALL LYMPHOCYTES.  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 He denies feeling any differently now than he did a year ago. He takes daily 81mg  aspirin which he notes has led to some mild increased bruising.   Of note prior to the patient's visit today,  He notes that he has had a couple spots of melanoma on his chin and back about 5 and 8 years ago. These were subsequently removed. He has had a basal cell as well and has been treated several times with liquid nitrogen.   On review of systems, pt reports stable energy levels, some bruising, stable bowel habits, and denies fevers, chills, night sweats, unexpected weight loss, pain along the spine, abdominal pains, testicular pain or swelling, and any other symptoms.   On PMHx the pt reports melanoma, CAD,  HTN, HLD, cataract in left eye.   INTERVAL HISTORY:   Harold [name] returns today for management and evaluation of his follicular lymphoma. He is also here for a toxicity check after his second dose of weekly Rituxan x4. The patient's last visit with [place] was on 11/12/2018. The pt reports that he is doing well overall.  The pt reports that he has noticed two new lumps in his right supraclavicular area. His energy levels have been ok and he has no real complaints. His nausea has resolved. He notes that he continues to try and be active in his lifestyle.   He continues on rituximab. Today is day 1 cycle 4. The pt has no prohibitive toxicities from continuing this treatment at this time.  Lab results today (11/26/18) of CBC w/diff and CMP is as follows: all values are WNL except for RBC at 4.19, Hgb at 11.7, HCT at 36.7, and AST at 11. 11/26/18 Uric acid at 6.4   On review of systems, pt denies nausea, fatigue, abdominal pain and any other symptoms.     MEDICAL HISTORY:  Past Medical History:  Diagnosis Date   BPH (benign prostatic hypertrophy)    CAD (coronary artery disease) 01/2010   S/P drug eluting stents LAD and PL branch of RCA Dr. Olevia Perches   follicular lymphoma dx'd 06/2017   Hernia    HLD (hyperlipidemia)    HTN (hypertension)     SURGICAL HISTORY: Past Surgical History:  Procedure Laterality Date   BACK SURGERY  HERNIA REPAIR  01/08/11   RIH   MASS EXCISION Right 08/05/2017   Procedure: EXCISION RIGHT POSTERIOR  NECK LYMPH NODE;  Surgeon: Rozetta Nunnery, MD;  Location: [facility];  Service: ENT;  Laterality: Right;    SOCIAL HISTORY: Social History   Socioeconomic History   Marital status: Married    Spouse name: Not on file   Number of children: Not on file   Years of education: Not on file   Highest education level: Not on file  Occupational History   Not on file  Social Needs   Financial resource strain: Not on file    Food insecurity    Worry: Not on file    Inability: Not on file   Transportation needs    Medical: Not on file    Non-medical: Not on file  Tobacco Use   Smoking status: Never Smoker   Smokeless tobacco: Never Used  Substance and Sexual Activity   Alcohol use: No   Drug use: No   Sexual activity: Not on file  Lifestyle   Physical activity    Days per week: Not on file    Minutes per session: Not on file   Stress: Not on file  Relationships   Social connections    Talks on phone: Not on file    Gets together: Not on file    Attends religious service: Not on file    Active member of club or organization: Not on file    Attends meetings of clubs or organizations: Not on file    Relationship status: Not on file   Intimate partner violence    Fear of current or ex partner: Not on file    Emotionally abused: Not on file    Physically abused: Not on file    Forced sexual activity: Not on file  Other Topics Concern   Not on file  Social History Narrative   Married    FAMILY HISTORY: Family History  Problem Relation Age of Onset   Heart failure Mother     ALLERGIES:  has No Known Allergies.  MEDICATIONS:  Current Outpatient Medications  Medication Sig Dispense Refill   allopurinol (ZYLOPRIM) 100 MG tablet Take 1 tablet (100 mg total) by mouth 2 (two) times daily. 30 tablet 0   aspirin 81 MG tablet Take 81 mg by mouth daily.       atenolol (TENORMIN) 25 MG tablet Take 1 tablet (25 mg total) by mouth daily. 90 tablet 3   brimonidine (ALPHAGAN) 0.2 % ophthalmic solution Place 1 drop into the right eye 2 (two) times daily.       cholecalciferol (VITAMIN D) 1000 UNITS tablet Take 1,000 Units by mouth daily.       finasteride (PROSCAR) 5 MG tablet Take 5 mg by mouth daily.       hydrochlorothiazide (HYDRODIURIL) 25 MG tablet Take 1 tablet (25 mg total) by mouth daily. 90 tablet 3   isosorbide dinitrate (ISORDIL) 5 MG tablet Take 1 tablet (5 mg total) by  mouth 2 (two) times daily. 180 tablet 3   lisinopril (PRINIVIL,ZESTRIL) 10 MG tablet Take 1 tablet (10 mg total) by mouth daily. 90 tablet 3   nitroGLYCERIN (NITROSTAT) 0.4 MG SL tablet Place 1 tablet (0.4 mg total) under the tongue every 5 (five) minutes as needed for chest pain. 25 tablet 6   ondansetron (ZOFRAN) 8 MG tablet Take 1 tablet (8 mg total) by mouth every 8 (eight) hours as needed  for nausea. 30 tablet 0   pravastatin (PRAVACHOL) 40 MG tablet Take 1 tablet (40 mg total) by mouth at bedtime. 90 tablet 3   timolol (TIMOPTIC) 0.5 % ophthalmic solution Place 1 drop into the right eye daily.       No current facility-administered medications for this visit.     REVIEW OF SYSTEMS:   A 10+ POINT REVIEW OF SYSTEMS WAS OBTAINED including neurology, dermatology, psychiatry, cardiac, respiratory, lymph, extremities, GI, GU, Musculoskeletal, constitutional, breasts, reproductive, HEENT.  All pertinent positives are noted in the HPI.  All others are negative.    PHYSICAL EXAMINATION: ECOG PERFORMANCE STATUS: 1 - Symptomatic but completely ambulatory  Vitals:   11/26/18 1006  BP: 108/60  Pulse: 66  Resp: 18  Temp: 99.1 F (37.3 C)  SpO2: 100%   Filed Weights   11/26/18 1006  Weight: 164 lb 9.6 oz (74.7 kg)   Body mass index is 23.62 kg/m.   GENERAL:alert, in no acute distress and comfortable SKIN: no acute rashes, no significant lesions EYES: conjunctiva are pink and non-injected, sclera anicteric OROPHARYNX: MMM, no exudates, no oropharyngeal erythema or ulceration NECK: supple, no JVD LYMPH:  much reduced lymphadenopathy in his neck, groin, and axilla. LUNGS: clear to auscultation b/l with normal respiratory effort HEART: regular rate & rhythm ABDOMEN:  normoactive bowel sounds , non tender, not distended. Extremity: +1 pedal edema, much reduced from before [facility]: alert & oriented x 3 with fluent speech NEURO: no focal motor/sensory deficits    LABORATORY DATA:    I have reviewed the data as listed  . CBC Latest Ref Rng & Units 11/26/2018 11/19/2018 11/12/2018  WBC 4.0 - 10.5 K/uL 8.1 8.8 7.8  Hemoglobin 13.0 - 17.0 g/dL 11.7(L) 11.9(L) 11.3(L)  Hematocrit 39.0 - 52.0 % 36.7(L) 36.7(L) 35.3(L)  Platelets 150 - 400 K/uL 220 269 337    . CMP Latest Ref Rng & Units 11/26/2018 11/19/2018 11/12/2018  Glucose 70 - 99 mg/dL 80 91 90  BUN 8 - 23 mg/dL 22 27(H) 25(H)  Creatinine 0.61 - 1.24 mg/dL 0.88 0.91 0.86  Sodium 135 - 145 mmol/L 140 140 141  Potassium 3.5 - 5.1 mmol/L 3.8 3.6 3.6  Chloride 98 - 111 mmol/L 105 105 105  CO2 22 - 32 mmol/L 27 25 25   Calcium 8.9 - 10.3 mg/dL 9.2 9.3 9.3  Total Protein 6.5 - 8.1 g/dL 6.5 6.7 6.7  Total Bilirubin 0.3 - 1.2 mg/dL 0.9 0.6 0.6  Alkaline Phos 38 - 126 U/L 57 59 64  AST 15 - 41 U/L 11(L) 11(L) 11(L)  ALT 0 - 44 U/L 6 7 8   uric acid 6.4  08/08/17 Pathology:   08/05/17 Pathology:   RADIOGRAPHIC STUDIES: I have personally reviewed the radiological images as listed and agreed with the findings in the report. Ct Soft Tissue Neck W Contrast  Result Date: 11/04/2018 CLINICAL DATA:  Follicular lymphoma, stage III/IV EXAM: CT NECK WITH CONTRAST TECHNIQUE: Multidetector CT imaging of the neck was performed using the standard protocol following the bolus administration of intravenous contrast. CONTRAST:  129mL OMNIPAQUE IOHEXOL 300 MG/ML  SOLN COMPARISON:  PET-CT 09/02/2017 FINDINGS: Pharynx and larynx: No appreciable mass or swelling. Salivary glands: Bilateral intraparotid masses are new from prior exam and likely reflect lymphadenopathy. On the right these measure up to 1.3 cm in short axis. On the left these measure up to 1.4 cm in short axis. The submandibular glands are unremarkable. Thyroid: Heterogeneous enhancement within the inferior left thyroid lobe may  be related to beam hardening artifact from a dense left-sided contrast bolus. Otherwise unremarkable. Lymph nodes: There has been significant interval  progression of cervical lymphadenopathy with now diffuse bilateral cervical lymphadenopathy at essentially all nodal stations. An index large nodal conglomerate on the right at the level 5 station measures 3.9 x 2.6 cm in transaxial dimensions (series 604, image 28). An index node within the lower left neck immediately lateral to the left common carotid artery measures 1.8 cm in short axis (series 603, image 42). Vascular: Soft and calcified plaque at the bilateral carotid bifurcations (greater on the right). Bulky right-sided lymphadenopathy causes multifocal narrowing of the right internal jugular vein. Significant focal narrowing of the left internal jugular vein within the mid to lower left neck, without significant mass effect at this level. Limited intracranial: Unremarkable Visualized orbits: Unremarkable Mastoids and visualized paranasal sinuses: Imaged portions demonstrate no significant paranasal sinus disease or mastoid effusion. Skeleton: Cervical spondylosis without high-grade bony spinal canal stenosis. C7-T1 grade 1 anterolisthesis. No suspicious lytic or blastic osseous lesions. Upper chest: Please refer to concurrent chest CT for a description of findings below the level of the thoracic inlet. IMPRESSION: Marked interval progression of cervical lymphadenopathy since PET-CT 09/02/2017, now with lymphadenopathy present bilaterally at essentially all stations. Index nodes as described. This includes bilateral intraparotid lymphadenopathy. Bulky right-sided adenopathy results in multifocal narrowing of the right internal jugular vein. Significant narrowing of the mid to lower left internal jugular vein of unclear cause as there is no significant mass effect at this level. Electronically Signed   By: Kellie Simmering   On: 11/04/2018 10:16   Ct Chest W Contrast  Result Date: 11/03/2018 CLINICAL DATA:  Followup lymphoma. EXAM: CT CHEST, ABDOMEN, AND PELVIS WITH CONTRAST TECHNIQUE: Multidetector CT imaging  of the chest, abdomen and pelvis was performed following the standard protocol during bolus administration of intravenous contrast. CONTRAST:  179mL OMNIPAQUE IOHEXOL 300 MG/ML  SOLN COMPARISON:  03/20/2018. FINDINGS: CT CHEST FINDINGS Cardiovascular: Normal heart size. Aortic atherosclerosis. Calcification in the LAD, left circumflex and RCA coronary arteries identified. Mediastinum/Nodes: Normal appearance of the thyroid gland. The trachea appears patent and is midline. Normal appearance of the esophagus. Persistent and progressive axillary adenopathy. Index left axillary node measures 5.5 x 2.8 cm, image 19/2. Previously 3.0 x 2.5 cm. Multiple new left axillary lymph nodes are identified many of which have a central area of low attenuation compatible with necrosis. Index node measures 2.6 x 2.0 cm, image 18/2. New from previous exam. New bilateral supraclavicular adenopathy. Index left supraclavicular node measures 1.8 x 1.4 cm, image 11/2. Progressive right axillary adenopathy noted including 2.8 x 2.4 cm node, image 15/2. Previously this measured 1.3 x 0.8 cm. New right axillary lymph node measures 5 by 2 cm, image 21/2. New bilateral anterior posterior lower cervical the adenopathy. Left posterior cervical node measures 1.7 x 1.1 cm, image 3/2. Right posterior cervical node measures 1.5 x 1.3 cm. Within the posterior mediastinum there are multiple enlarged lymph nodes which are new from previous exam. Lymph node posterior to the esophagus has a short axis of 1.4 cm, image 33/2. Lymph node between the descending aorta and esophagus measures 1.5 x 1.7 cm, image 40/2. Lungs/Pleura: No pleural effusion identified. Mild chronic diffuse interstitial reticulation is identified. No airspace consolidation, atelectasis or pneumothorax. Musculoskeletal: No chest wall mass or suspicious bone lesions identified. CT ABDOMEN PELVIS FINDINGS Hepatobiliary: No focal liver abnormality is seen. No gallstones, gallbladder wall  thickening, or biliary dilatation. Pancreas:  Diffuse pancreatic calcifications are identified compatible with chronic pancreatitis. No acute inflammation, main duct dilatation or mass. Spleen: Normal in size without focal abnormality. Adrenals/Urinary Tract: Normal appearance of the adrenal glands. No kidney mass or hydronephrosis. Stones within the urinary bladder are identified measuring up to 6 mm. Within the distal left ureter there is a calcification measuring 6 mm. Stomach/Bowel: Stomach normal. The small bowel loops have a normal course and caliber. No pathologic dilatation of the colon. Vascular/Lymphatic: No aneurysm. Mild aortic atherosclerosis. Interval progression of retroperitoneal and pelvic adenopathy. Index left retroperitoneal node measures 1.5 cm, image 82/2. New from previous exam. Retrocaval lymph node measures 1.7 cm, image 77/2. Previously 0.5 cm. Right external iliac node measures 5.6 by 3.3 cm, image 111/2. Previously 3.8 by 2.7 cm. Left external iliac node measures 2.5 x 1.6 cm, image 110/2. New from previous exam. Right inguinal node measures 4.4 x 3.0 cm, image 120/2. Previously 3.9 by 2.2 cm. Reproductive: Prostate is unremarkable. Other: No abdominal wall hernia or abnormality. No abdominopelvic ascites. Musculoskeletal: No acute or significant osseous findings. IMPRESSION: 1. Progression of bilateral axillary, retroperitoneal, pelvic, and inguinal adenopathy. New anterior and posterior lower cervical adenopathy identified within the neck. 2. Persistent distal left ureteral calculus. Small bladder calculi are unchanged. 3. Aortic Atherosclerosis (ICD10-I70.0). Coronary artery calcifications. 4. Sequelae of chronic pancreatitis noted. Electronically Signed   By: Kerby Moors M.D.   On: 11/03/2018 14:17   Ct Abdomen Pelvis W Contrast  Result Date: 11/03/2018 CLINICAL DATA:  Followup lymphoma. EXAM: CT CHEST, ABDOMEN, AND PELVIS WITH CONTRAST TECHNIQUE: Multidetector CT imaging of the  chest, abdomen and pelvis was performed following the standard protocol during bolus administration of intravenous contrast. CONTRAST:  130mL OMNIPAQUE IOHEXOL 300 MG/ML  SOLN COMPARISON:  03/20/2018. FINDINGS: CT CHEST FINDINGS Cardiovascular: Normal heart size. Aortic atherosclerosis. Calcification in the LAD, left circumflex and RCA coronary arteries identified. Mediastinum/Nodes: Normal appearance of the thyroid gland. The trachea appears patent and is midline. Normal appearance of the esophagus. Persistent and progressive axillary adenopathy. Index left axillary node measures 5.5 x 2.8 cm, image 19/2. Previously 3.0 x 2.5 cm. Multiple new left axillary lymph nodes are identified many of which have a central area of low attenuation compatible with necrosis. Index node measures 2.6 x 2.0 cm, image 18/2. New from previous exam. New bilateral supraclavicular adenopathy. Index left supraclavicular node measures 1.8 x 1.4 cm, image 11/2. Progressive right axillary adenopathy noted including 2.8 x 2.4 cm node, image 15/2. Previously this measured 1.3 x 0.8 cm. New right axillary lymph node measures 5 by 2 cm, image 21/2. New bilateral anterior posterior lower cervical the adenopathy. Left posterior cervical node measures 1.7 x 1.1 cm, image 3/2. Right posterior cervical node measures 1.5 x 1.3 cm. Within the posterior mediastinum there are multiple enlarged lymph nodes which are new from previous exam. Lymph node posterior to the esophagus has a short axis of 1.4 cm, image 33/2. Lymph node between the descending aorta and esophagus measures 1.5 x 1.7 cm, image 40/2. Lungs/Pleura: No pleural effusion identified. Mild chronic diffuse interstitial reticulation is identified. No airspace consolidation, atelectasis or pneumothorax. Musculoskeletal: No chest wall mass or suspicious bone lesions identified. CT ABDOMEN PELVIS FINDINGS Hepatobiliary: No focal liver abnormality is seen. No gallstones, gallbladder wall  thickening, or biliary dilatation. Pancreas: Diffuse pancreatic calcifications are identified compatible with chronic pancreatitis. No acute inflammation, main duct dilatation or mass. Spleen: Normal in size without focal abnormality. Adrenals/Urinary Tract: Normal appearance of the adrenal glands.  No kidney mass or hydronephrosis. Stones within the urinary bladder are identified measuring up to 6 mm. Within the distal left ureter there is a calcification measuring 6 mm. Stomach/Bowel: Stomach normal. The small bowel loops have a normal course and caliber. No pathologic dilatation of the colon. Vascular/Lymphatic: No aneurysm. Mild aortic atherosclerosis. Interval progression of retroperitoneal and pelvic adenopathy. Index left retroperitoneal node measures 1.5 cm, image 82/2. New from previous exam. Retrocaval lymph node measures 1.7 cm, image 77/2. Previously 0.5 cm. Right external iliac node measures 5.6 by 3.3 cm, image 111/2. Previously 3.8 by 2.7 cm. Left external iliac node measures 2.5 x 1.6 cm, image 110/2. New from previous exam. Right inguinal node measures 4.4 x 3.0 cm, image 120/2. Previously 3.9 by 2.2 cm. Reproductive: Prostate is unremarkable. Other: No abdominal wall hernia or abnormality. No abdominopelvic ascites. Musculoskeletal: No acute or significant osseous findings. IMPRESSION: 1. Progression of bilateral axillary, retroperitoneal, pelvic, and inguinal adenopathy. New anterior and posterior lower cervical adenopathy identified within the neck. 2. Persistent distal left ureteral calculus. Small bladder calculi are unchanged. 3. Aortic Atherosclerosis (ICD10-I70.0). Coronary artery calcifications. 4. Sequelae of chronic pancreatitis noted. Electronically Signed   By: Kerby Moors M.D.   On: 11/03/2018 14:17    ASSESSMENT & PLAN:   84 y.o. male with  1.Recently diagnosed atleast Stage III Follicular Lymphoma Grade 1-2 presenting with rt cervical lymphadenopathy  08/05/17 pathology  results of right cervical LN revealing low grade follicular lymphoma  [id number] PET revealed Hypermetabolic lymphadenopathy involving the right neck, bilateral axillary, right hilar and right inguinal lymph nodes. No abdominal or pelvic involvement.    03/20/18 CT C/A/P revealed Progressive bilateral axillary and right inguinal lymphadenopathy, including necrotic lymph nodes in the right axilla, as above. Spleen is normal in size. 4 mm distal left ureteral calculus, unchanged. No hydronephrosis. Stable layering bladder calculi measuring up to 5 mm. Cholelithiasis, without associated inflammatory changes. Stable sequela of prior/chronic pancreatitis.   11/03/2018 CT neck revealed "Marked interval progression of cervical lymphadenopathy since PET-CT 09/02/2017, now with lymphadenopathy present bilaterally at essentially all stations. Index nodes as described. This includes bilateral intraparotid lymphadenopathy. Bulky right-sided adenopathy results in multifocal narrowing of the right internal jugular vein. Significant narrowing of the mid to lower left internal jugular vein of unclear cause as there is no significant mass effect at this level."  11/03/2018 CT CAP revealed "1. Progression of bilateral axillary, retroperitoneal, pelvic, and inguinal adenopathy. New anterior and posterior lower cervical adenopathy identified within the neck. 2. Persistent distal left ureteral calculus. Small bladder calculi are unchanged. 3. Aortic Atherosclerosis (ICD10-I70.0). Coronary artery calcifications. 4. Sequelae of chronic pancreatitis noted."  2. 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 3. H/o head and neck Basal cell carcinoma Continue routine skin checks with dermatologist     PLAN: -Discussed pt labwork today, 11/26/18; all values are WNL except for RBC at 4.19, Hgb at 11.7, HCT at 36.7, and AST at 11. -11/26/18 Uric acid at 6.4  -Discussed the potential of keeping him on the medication on a maintenance dose for  a few years or taking him off of it and monitoring him. -Off Allopurinol -Repeat scan in 3 months -The pt has no prohibitive toxicities from continuing rituximab at this time. -Recommended that the pt continue to eat well, drink at least 48-64 oz of water each day, and walk 20-30 minutes each day.   FOLLOW UP: CT chest/abd/pelvis in 12 weeks with labs RTC with dr Irene Limbo in 14 weeks  The total time spent in the appt was 20 minutes and more than 50% was on counseling and direct patient cares.  All of the patient's questions were answered with apparent satisfaction. The patient knows to call the clinic with any problems, questions or concerns.    Sullivan Lone MD MS AAHIVMS [facility] Hematology/Oncology Physician [facility]  (Office):       [phone] (Work cell):  [phone] (Fax):           [phone]  11/26/2018 10:12 AM  I, Jacqualyn Posey, am acting as a Education administrator for Dr. Sullivan Lone.   .I have reviewed the above documentation for accuracy and completeness, and I agree with the above. Brunetta Genera MD

## 2018-11-26 ENCOUNTER — Telehealth: Payer: Self-pay | Admitting: Hematology

## 2018-11-26 ENCOUNTER — Inpatient Hospital Stay: Payer: Medicare Other | Admitting: Hematology

## 2018-11-26 ENCOUNTER — Other Ambulatory Visit: Payer: Medicare Other

## 2018-11-26 ENCOUNTER — Other Ambulatory Visit: Payer: Self-pay

## 2018-11-26 ENCOUNTER — Inpatient Hospital Stay: Payer: Medicare Other

## 2018-11-26 VITALS — BP 109/50 | HR 64 | Temp 97.6°F | Resp 18

## 2018-11-26 VITALS — BP 108/60 | HR 66 | Temp 99.1°F | Resp 18 | Ht 70.0 in | Wt 164.6 lb

## 2018-11-26 DIAGNOSIS — Z5112 Encounter for antineoplastic immunotherapy: Secondary | ICD-10-CM | POA: Diagnosis not present

## 2018-11-26 DIAGNOSIS — C8208 Follicular lymphoma grade I, lymph nodes of multiple sites: Secondary | ICD-10-CM

## 2018-11-26 DIAGNOSIS — Z7189 Other specified counseling: Secondary | ICD-10-CM

## 2018-11-26 DIAGNOSIS — Z5111 Encounter for antineoplastic chemotherapy: Secondary | ICD-10-CM | POA: Diagnosis not present

## 2018-11-26 LAB — CBC WITH DIFFERENTIAL/PLATELET
Abs Immature Granulocytes: 0.05 10*3/uL (ref 0.00–0.07)
Basophils Absolute: 0 10*3/uL (ref 0.0–0.1)
Basophils Relative: 1 %
Eosinophils Absolute: 0.1 10*3/uL (ref 0.0–0.5)
Eosinophils Relative: 1 %
HCT: 36.7 % — ABNORMAL LOW (ref 39.0–52.0)
Hemoglobin: 11.7 g/dL — ABNORMAL LOW (ref 13.0–17.0)
Immature Granulocytes: 1 %
Lymphocytes Relative: 13 %
Lymphs Abs: 1.1 10*3/uL (ref 0.7–4.0)
MCH: 27.9 pg (ref 26.0–34.0)
MCHC: 31.9 g/dL (ref 30.0–36.0)
MCV: 87.6 fL (ref 80.0–100.0)
Monocytes Absolute: 0.9 10*3/uL (ref 0.1–1.0)
Monocytes Relative: 11 %
Neutro Abs: 6 10*3/uL (ref 1.7–7.7)
Neutrophils Relative %: 73 %
Platelets: 220 10*3/uL (ref 150–400)
RBC: 4.19 MIL/uL — ABNORMAL LOW (ref 4.22–5.81)
RDW: 13.9 % (ref 11.5–15.5)
WBC: 8.1 10*3/uL (ref 4.0–10.5)
nRBC: 0 % (ref 0.0–0.2)

## 2018-11-26 LAB — CMP (CANCER CENTER ONLY)
ALT: 6 U/L (ref 0–44)
AST: 11 U/L — ABNORMAL LOW (ref 15–41)
Albumin: 3.8 g/dL (ref 3.5–5.0)
Alkaline Phosphatase: 57 U/L (ref 38–126)
Anion gap: 8 (ref 5–15)
BUN: 22 mg/dL (ref 8–23)
CO2: 27 mmol/L (ref 22–32)
Calcium: 9.2 mg/dL (ref 8.9–10.3)
Chloride: 105 mmol/L (ref 98–111)
Creatinine: 0.88 mg/dL (ref 0.61–1.24)
GFR, Est AFR Am: >60 mL/min (ref >60)
GFR, Estimated: >60 mL/min (ref >60)
Glucose, Bld: 80 mg/dL (ref 70–99)
Potassium: 3.8 mmol/L (ref 3.5–5.1)
Sodium: 140 mmol/L (ref 135–145)
Total Bilirubin: 0.9 mg/dL (ref 0.3–1.2)
Total Protein: 6.5 g/dL (ref 6.5–8.1)

## 2018-11-26 LAB — URIC ACID: Uric Acid, Serum: 6.4 mg/dL (ref 3.7–8.6)

## 2018-11-26 MED ORDER — DIPHENHYDRAMINE HCL 25 MG PO CAPS
ORAL_CAPSULE | ORAL | Status: AC
  Filled 2018-11-26: qty 2

## 2018-11-26 MED ORDER — FAMOTIDINE IN NACL 20-0.9 MG/50ML-% IV SOLN
20.0000 mg | Freq: Once | INTRAVENOUS | Status: AC
  Administered 2018-11-26: 20 mg via INTRAVENOUS

## 2018-11-26 MED ORDER — SODIUM CHLORIDE 0.9 % IV SOLN
Freq: Once | INTRAVENOUS | Status: AC
  Administered 2018-11-26: 11:00:00 via INTRAVENOUS
  Filled 2018-11-26: qty 250

## 2018-11-26 MED ORDER — METHYLPREDNISOLONE SODIUM SUCC 125 MG IJ SOLR
125.0000 mg | Freq: Once | INTRAMUSCULAR | Status: AC
  Administered 2018-11-26: 125 mg via INTRAVENOUS

## 2018-11-26 MED ORDER — DIPHENHYDRAMINE HCL 25 MG PO CAPS
50.0000 mg | ORAL_CAPSULE | Freq: Once | ORAL | Status: AC
  Administered 2018-11-26: 50 mg via ORAL

## 2018-11-26 MED ORDER — SODIUM CHLORIDE 0.9 % IV SOLN
375.0000 mg/m2 | Freq: Once | INTRAVENOUS | Status: AC
  Administered 2018-11-26: 700 mg via INTRAVENOUS
  Filled 2018-11-26: qty 50

## 2018-11-26 MED ORDER — ACETAMINOPHEN 325 MG PO TABS
ORAL_TABLET | ORAL | Status: AC
  Filled 2018-11-26: qty 2

## 2018-11-26 MED ORDER — FAMOTIDINE IN NACL 20-0.9 MG/50ML-% IV SOLN
INTRAVENOUS | Status: AC
  Filled 2018-11-26: qty 50

## 2018-11-26 MED ORDER — METHYLPREDNISOLONE SODIUM SUCC 125 MG IJ SOLR
INTRAMUSCULAR | Status: AC
  Filled 2018-11-26: qty 2

## 2018-11-26 MED ORDER — ACETAMINOPHEN 325 MG PO TABS
650.0000 mg | ORAL_TABLET | Freq: Once | ORAL | Status: AC
  Administered 2018-11-26: 650 mg via ORAL

## 2018-11-26 NOTE — Telephone Encounter (Signed)
Scheduled appt per 9/10 los. ° °Sent a message and a calendar will be mailed out. °

## 2018-11-26 NOTE — Patient Instructions (Signed)
[facility] Discharge Instructions for Patients Receiving Chemotherapy  Today you received the following chemotherapy agents:  Rituxan.  To help prevent nausea and vomiting after your treatment, we encourage you to take your nausea medication as directed.   If you develop nausea and vomiting that is not controlled by your nausea medication, call the clinic.   BELOW ARE SYMPTOMS THAT SHOULD BE REPORTED IMMEDIATELY:  *FEVER GREATER THAN 100.5 F  *CHILLS WITH OR WITHOUT FEVER  NAUSEA AND VOMITING THAT IS NOT CONTROLLED WITH YOUR NAUSEA MEDICATION  *UNUSUAL SHORTNESS OF BREATH  *UNUSUAL BRUISING OR BLEEDING  TENDERNESS IN MOUTH AND THROAT WITH OR WITHOUT PRESENCE OF ULCERS  *URINARY PROBLEMS  *BOWEL PROBLEMS  UNUSUAL RASH Items with * indicate a potential emergency and should be followed up as soon as possible.  Feel free to call the clinic should you have any questions or concerns. The clinic phone number is [phone].  Please show the CHEMO ALERT CARD at check-in to the Emergency Department and triage nurse.   

## 2019-02-09 DIAGNOSIS — N201 Calculus of ureter: Secondary | ICD-10-CM | POA: Insufficient documentation

## 2019-02-18 ENCOUNTER — Ambulatory Visit (HOSPITAL_COMMUNITY)
Admission: RE | Admit: 2019-02-18 | Discharge: 2019-02-18 | Disposition: A | Discharge status: Home / Self Care | Payer: Medicare Other | Source: Ambulatory Visit | Attending: Hematology | Admitting: Hematology

## 2019-02-18 ENCOUNTER — Other Ambulatory Visit: Payer: Self-pay

## 2019-02-18 ENCOUNTER — Inpatient Hospital Stay: Payer: Medicare Other | Attending: Hematology

## 2019-02-18 DIAGNOSIS — C8201 Follicular lymphoma grade I, lymph nodes of head, face, and neck: Secondary | ICD-10-CM | POA: Insufficient documentation

## 2019-02-18 DIAGNOSIS — D649 Anemia, unspecified: Secondary | ICD-10-CM | POA: Diagnosis not present

## 2019-02-18 DIAGNOSIS — C8208 Follicular lymphoma grade I, lymph nodes of multiple sites: Secondary | ICD-10-CM | POA: Diagnosis present

## 2019-02-18 DIAGNOSIS — Z5112 Encounter for antineoplastic immunotherapy: Secondary | ICD-10-CM

## 2019-02-18 DIAGNOSIS — Z79899 Other long term (current) drug therapy: Secondary | ICD-10-CM | POA: Insufficient documentation

## 2019-02-18 DIAGNOSIS — Z85820 Personal history of malignant melanoma of skin: Secondary | ICD-10-CM | POA: Insufficient documentation

## 2019-02-18 LAB — CMP (CANCER CENTER ONLY)
ALT: 9 U/L (ref 0–44)
AST: 13 U/L — ABNORMAL LOW (ref 15–41)
Albumin: 3.9 g/dL (ref 3.5–5.0)
Alkaline Phosphatase: 60 U/L (ref 38–126)
Anion gap: 9 (ref 5–15)
BUN: 23 mg/dL (ref 8–23)
CO2: 28 mmol/L (ref 22–32)
Calcium: 9.4 mg/dL (ref 8.9–10.3)
Chloride: 105 mmol/L (ref 98–111)
Creatinine: 0.87 mg/dL (ref 0.61–1.24)
GFR, Est AFR Am: >60 mL/min (ref >60)
GFR, Estimated: >60 mL/min (ref >60)
Glucose, Bld: 85 mg/dL (ref 70–99)
Potassium: 4 mmol/L (ref 3.5–5.1)
Sodium: 142 mmol/L (ref 135–145)
Total Bilirubin: 0.6 mg/dL (ref 0.3–1.2)
Total Protein: 6.7 g/dL (ref 6.5–8.1)

## 2019-02-18 LAB — CBC WITH DIFFERENTIAL/PLATELET
Abs Immature Granulocytes: 0.02 10*3/uL (ref 0.00–0.07)
Basophils Absolute: 0 10*3/uL (ref 0.0–0.1)
Basophils Relative: 0 %
Eosinophils Absolute: 0.2 10*3/uL (ref 0.0–0.5)
Eosinophils Relative: 3 %
HCT: 38.7 % — ABNORMAL LOW (ref 39.0–52.0)
Hemoglobin: 12.3 g/dL — ABNORMAL LOW (ref 13.0–17.0)
Immature Granulocytes: 0 %
Lymphocytes Relative: 24 %
Lymphs Abs: 1.6 10*3/uL (ref 0.7–4.0)
MCH: 28 pg (ref 26.0–34.0)
MCHC: 31.8 g/dL (ref 30.0–36.0)
MCV: 88.2 fL (ref 80.0–100.0)
Monocytes Absolute: 0.7 10*3/uL (ref 0.1–1.0)
Monocytes Relative: 11 %
Neutro Abs: 4.2 10*3/uL (ref 1.7–7.7)
Neutrophils Relative %: 62 %
Platelets: 243 10*3/uL (ref 150–400)
RBC: 4.39 MIL/uL (ref 4.22–5.81)
RDW: 13.5 % (ref 11.5–15.5)
WBC: 6.8 10*3/uL (ref 4.0–10.5)
nRBC: 0 % (ref 0.0–0.2)

## 2019-02-18 LAB — URIC ACID: Uric Acid, Serum: 6 mg/dL (ref 3.7–8.6)

## 2019-02-18 MED ORDER — IOHEXOL 9 MG/ML PO SOLN
ORAL | Status: AC
  Filled 2019-02-18: qty 1000

## 2019-02-18 MED ORDER — IOHEXOL 12 MG/ML PO SOLN
100.0000 mL | ORAL | Status: AC
  Administered 2019-02-18: 100 mL via ORAL

## 2019-02-18 MED ORDER — SODIUM CHLORIDE (PF) 0.9 % IJ SOLN
INTRAMUSCULAR | Status: AC
  Filled 2019-02-18: qty 50

## 2019-02-18 MED ORDER — IOHEXOL 300 MG/ML  SOLN
100.0000 mL | Freq: Once | INTRAMUSCULAR | Status: AC | PRN
  Administered 2019-02-18: 100 mL via INTRAVENOUS

## 2019-03-03 NOTE — Progress Notes (Signed)
HEMATOLOGY/ONCOLOGY CLINIC NOTE  Date of Service: 03/04/2019  Patient Care Team: Harold Every, MD as PCP - General (Family Medicine) Harold Blanks, MD as PCP - Cardiology (Cardiology)  CHIEF COMPLAINTS/PURPOSE OF CONSULTATION:  mx of low grade Follicular Lymphoma  HISTORY OF PRESENTING ILLNESS:   Harold [name] is a wonderful 84 y.o. male who has been referred to [place] by Dr [name] for evaluation and management of Follicular Lymphoma. He is accompanied today by his sister. The pt reports that he is doing well overall.   The pt reports first noticing the rt neck mass develop in his neck about a year ago and saw his PCP Dr Harold [name].  The mass was getting larger and so he was referred to Dr [name] for further evaluation and had a biopsy of the rt upper neck mass which showed Grade 1-2 follicular lymphoma. pt has had Tissue flow cytometry completed on 08/05/17 with results revealing A LAMBDA-RESTRICTED MONOCLONAL B-CELL POPULATION EXPRESSING CD10 COMPRISES 39% OF ALL LYMPHOCYTES.  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 He denies feeling any differently now than he did a year ago. He takes daily 81mg  aspirin which he notes has led to some mild increased bruising.   Of note prior to the patient's visit today,  He notes that he has had a couple spots of melanoma on his chin and back about 5 and 8 years ago. These were subsequently removed. He has had a basal cell as well and has been treated several times with liquid nitrogen.   On review of systems, pt reports stable energy levels, some bruising, stable bowel habits, and denies fevers, chills, night sweats, unexpected weight loss, pain along the spine, abdominal pains, testicular pain or swelling, and any other symptoms.   On PMHx the pt reports melanoma, CAD,  HTN, HLD, cataract in left eye.   INTERVAL HISTORY:   Harold [name] returns today for management and evaluation of his follicular lymphoma. He is also here for a toxicity check. The patient's last visit with [place] was on 11/26/2018. The pt reports that he is doing well overall.  The pt reports lower back pain across the whole lower back area.   He hasnt passed any stones in over 15 years.   Of note since the patient's last visit, pt has had CT Abdomen Pelvis W Contrast (Accession [id number]) completed on 02/18/2019 with results revealing "1. Marked interval response to therapy with definite substantial decrease in lymphadenopathy seen previously in the chest, abdomen, and pelvis. No new or progressive lymphadenopathy on today's study. 2. Similar appearance of the distal left ureteral stone without substantial hydroureteronephrosis. Stable appearance of bladder calculi. 3. Possible tiny 5 mm polypoid soft tissue lesion posterior right bladder wall. Attention on follow-up recommended. Hematuria protocol CT scan could be used to further evaluate as clinically warranted. 4.  Aortic Atherosclerois (ICD10-170.0)."  Of note since the patient's last visit, pt has had CT Chest W Contrast (Accession [id number]) completed on 02/18/2019 with results revealing "1. Marked interval response to therapy with definite substantial decrease in lymphadenopathy seen previously in the chest, abdomen,and pelvis. No new or progressive lymphadenopathy on today's study.2. Similar appearance of the distal left ureteral stone without substantial hydroureteronephrosis. Stable appearance of bladder calculi. 3. Possible tiny 5 mm polypoid soft tissue lesion posterior right bladder wall. Attention  on follow-up recommended. Hematuria protocol CT scan could be used to further evaluate as clinically warranted. 4.  Aortic Atherosclerois (ICD10-170.0)"  Lab results today (02/18/19) of CBC w/diff and CMP is as follows: all values are WNL  except for Hemoglobin at 12.3, HCT at 38.7, AST at 13.  On review of systems, pt reports mid lower back pain and denies abdominal pain and any other symptoms.       MEDICAL HISTORY:  Past Medical History:  Diagnosis Date  . BPH (benign prostatic hypertrophy)   . CAD (coronary artery disease) 01/2010   S/P drug eluting stents LAD and PL branch of RCA Dr. Olevia Perches  . follicular lymphoma dx'd 06/2017  . Hernia   . HLD (hyperlipidemia)   . HTN (hypertension)     SURGICAL HISTORY: Past Surgical History:  Procedure Laterality Date  . BACK SURGERY    . HERNIA REPAIR  01/08/11   RIH  . MASS EXCISION Right 08/05/2017   Procedure: EXCISION RIGHT POSTERIOR  NECK LYMPH NODE;  Surgeon: Rozetta Nunnery, MD;  Location: [facility];  Service: ENT;  Laterality: Right;    SOCIAL HISTORY: Social History   Socioeconomic History  . Marital status: Married    Spouse name: Not on file  . Number of children: Not on file  . Years of education: Not on file  . Highest education level: Not on file  Occupational History  . Not on file  Tobacco Use  . Smoking status: Never Smoker  . Smokeless tobacco: Never Used  Substance and Sexual Activity  . Alcohol use: No  . Drug use: No  . Sexual activity: Not on file  Other Topics Concern  . Not on file  Social History Narrative   Married   Social Determinants of Health   Financial Resource Strain:   . Difficulty of Paying Living Expenses: Not on file  Food Insecurity:   . Worried About Charity fundraiser in the Last Year: Not on file  . Ran Out of Food in the Last Year: Not on file  Transportation Needs:   . Lack of Transportation (Medical): Not on file  . Lack of Transportation (Non-Medical): Not on file  Physical Activity:   . Days of Exercise per Week: Not on file  . Minutes of Exercise per Session: Not on file  Stress:   . Feeling of Stress : Not on file  Social Connections:   . Frequency of Communication with  Friends and Family: Not on file  . Frequency of Social Gatherings with Friends and Family: Not on file  . Attends Religious Services: Not on file  . Active Member of Clubs or Organizations: Not on file  . Attends Archivist Meetings: Not on file  . Marital Status: Not on file  Intimate Partner Violence:   . Fear of Current or Ex-Partner: Not on file  . Emotionally Abused: Not on file  . Physically Abused: Not on file  . Sexually Abused: Not on file    FAMILY HISTORY: Family History  Problem Relation Age of Onset  . Heart failure Mother     ALLERGIES:  has No Known Allergies.  MEDICATIONS:  Current Outpatient Medications  Medication Sig Dispense Refill  . allopurinol (ZYLOPRIM) 100 MG tablet Take 1 tablet (100 mg total) by mouth 2 (two) times daily. 30 tablet 0  . aspirin 81 MG tablet Take 81 mg by mouth daily.      [name] atenolol (TENORMIN) 25 MG tablet  Take 1 tablet (25 mg total) by mouth daily. 90 tablet 3  . brimonidine (ALPHAGAN) 0.2 % ophthalmic solution Place 1 drop into the right eye 2 (two) times daily.      . cholecalciferol (VITAMIN D) 1000 UNITS tablet Take 1,000 Units by mouth daily.      . finasteride (PROSCAR) 5 MG tablet Take 5 mg by mouth daily.      . hydrochlorothiazide (HYDRODIURIL) 25 MG tablet Take 1 tablet (25 mg total) by mouth daily. 90 tablet 3  . isosorbide dinitrate (ISORDIL) 5 MG tablet Take 1 tablet (5 mg total) by mouth 2 (two) times daily. 180 tablet 3  . lisinopril (PRINIVIL,ZESTRIL) 10 MG tablet Take 1 tablet (10 mg total) by mouth daily. 90 tablet 3  . nitroGLYCERIN (NITROSTAT) 0.4 MG SL tablet Place 1 tablet (0.4 mg total) under the tongue [name] 5 (five) minutes as needed for chest pain. 25 tablet 6  . ondansetron (ZOFRAN) 8 MG tablet Take 1 tablet (8 mg total) by mouth [name] 8 (eight) hours as needed for nausea. 30 tablet 0  . pravastatin (PRAVACHOL) 40 MG tablet Take 1 tablet (40 mg total) by mouth at bedtime. 90 tablet 3  . timolol  (TIMOPTIC) 0.5 % ophthalmic solution Place 1 drop into the right eye daily.       No current facility-administered medications for this visit.    REVIEW OF SYSTEMS:   A 10+ POINT REVIEW OF SYSTEMS WAS OBTAINED including neurology, dermatology, psychiatry, cardiac, respiratory, lymph, extremities, GI, GU, Musculoskeletal, constitutional, breasts, reproductive, HEENT.  All pertinent positives are noted in the HPI.  All others are negative.    PHYSICAL EXAMINATION: ECOG FS:2 - Symptomatic, <50% confined to bed  Exam performed in a chair.  Vitals:   03/04/19 1308  BP: (!) 148/68  Pulse: 70  Resp: 18  Temp: 98.2 F (36.8 C)  SpO2: 100%   Wt Readings from Last 3 Encounters:  03/04/19 171 lb 11.2 oz (77.9 kg)  11/26/18 164 lb 9.6 oz (74.7 kg)  11/12/18 167 lb 14.4 oz (76.2 kg)   Body mass index is 24.64 kg/m.    GENERAL:alert, in no acute distress and comfortable SKIN: no acute rashes, no significant lesions EYES: conjunctiva are pink and non-injected, sclera anicteric OROPHARYNX: MMM, no exudates, no oropharyngeal erythema or ulceration NECK: supple, no JVD LYMPH:  Right lypmph node just under palpable  LUNGS: clear to auscultation b/l with normal respiratory effort HEART: regular rate & rhythm ABDOMEN:  normoactive bowel sounds , non tender, not distended. Extremity:  +1 pedal edema PSYCH: alert & oriented x 3 with fluent speech NEURO: no focal motor/sensory deficits   LABORATORY DATA:  I have reviewed the data as listed  . CBC Latest Ref Rng & Units 02/18/2019 11/26/2018 11/19/2018  WBC 4.0 - 10.5 K/uL 6.8 8.1 8.8  Hemoglobin 13.0 - 17.0 g/dL 12.3(L) 11.7(L) 11.9(L)  Hematocrit 39.0 - 52.0 % 38.7(L) 36.7(L) 36.7(L)  Platelets 150 - 400 K/uL 243 220 269    . CMP Latest Ref Rng & Units 02/18/2019 11/26/2018 11/19/2018  Glucose 70 - 99 mg/dL 85 80 91  BUN 8 - 23 mg/dL 23 22 27(H)  Creatinine 0.61 - 1.24 mg/dL 0.87 0.88 0.91  Sodium 135 - 145 mmol/L 142 140 140  Potassium  3.5 - 5.1 mmol/L 4.0 3.8 3.6  Chloride 98 - 111 mmol/L 105 105 105  CO2 22 - 32 mmol/L 28 27 25   Calcium 8.9 - 10.3 mg/dL 9.4 9.2 9.3  Total Protein 6.5 - 8.1 g/dL 6.7 6.5 6.7  Total Bilirubin 0.3 - 1.2 mg/dL 0.6 0.9 0.6  Alkaline Phos 38 - 126 U/L 60 57 59  AST 15 - 41 U/L 13(L) 11(L) 11(L)  ALT 0 - 44 U/L 9 6 7   uric acid 6.4  02/18/2019 CT Abdomen Pelvis W Contrast (Accession [id number]):   02/18/2019 CT Chest W Contrast (Accession [id number]):  08/08/17 Pathology:   08/05/17 Pathology:   RADIOGRAPHIC STUDIES: I have personally reviewed the radiological images as listed and agreed with the findings in the report. CT Chest W Contrast  Result Date: 02/18/2019 CLINICAL DATA:  Follicular lymphoma. EXAM: CT CHEST, ABDOMEN, AND PELVIS WITH CONTRAST TECHNIQUE: Multidetector CT imaging of the chest, abdomen and pelvis was performed following the standard protocol during bolus administration of intravenous contrast. CONTRAST:  134mL OMNIPAQUE IOHEXOL 300 MG/ML  SOLN COMPARISON:  11/03/2018 FINDINGS: CT CHEST FINDINGS Cardiovascular: The heart size is normal. No substantial pericardial effusion. Coronary artery calcification is evident. Atherosclerotic calcification is noted in the wall of the thoracic aorta. Mediastinum/Nodes: No mediastinal lymphadenopathy. There is no hilar lymphadenopathy. The esophagus has normal imaging features. Axillary lymphadenopathy is decreased in the interval. Dominant left axillary node measured previously at 2.8 x 5.5 cm is now 0.8 x 2.5 cm (14/2). Index node measured in the right axilla on the prior study at 5.0 x 2.1 cm now measures 2.1 x 0.6 cm (23/2). Similar interval decrease in the bilateral subpectoral and lower cervical lymphadenopathy seen previously. Index subpectoral node measured previously at 1.4 x 1.8 cm now measures 0.8 x 0.7 cm. Lungs/Pleura: No suspicious pulmonary nodule or mass. No focal consolidation. No pleural effusion. Musculoskeletal: No  worrisome lytic or sclerotic osseous abnormality. CT ABDOMEN PELVIS FINDINGS Hepatobiliary: No suspicious focal abnormality within the liver parenchyma. There is no evidence for gallstones, gallbladder wall thickening, or pericholecystic fluid. No intrahepatic or extrahepatic biliary dilation. Pancreas: Diffuse parenchymal calcification compatible with chronic pancreatitis. No dilatation of the main duct. Spleen: No splenomegaly. No focal mass lesion. Adrenals/Urinary Tract: No adrenal nodule or mass. Kidneys unremarkable. No evidence for hydroureter. 4 x 7 mm stone noted distal left ureter. Bladder is distended with stones in the lumen. Possible 5 mm polypoid lesion along the mucosal surface of the right bladder wall near the UVJ (see axial image 110/series 2). Stomach/Bowel: Stomach is unremarkable. No gastric wall thickening. No evidence of outlet obstruction. Duodenum is normally positioned as is the ligament of Treitz. No small bowel wall thickening. No small bowel dilatation. The terminal ileum is normal. The appendix is not visualized, but there is no edema or inflammation in the region of the cecum. No gross colonic mass. No colonic wall thickening. Vascular/Lymphatic: There is abdominal aortic atherosclerosis without aneurysm. There is no gastrohepatic or hepatoduodenal ligament lymphadenopathy. No intraperitoneal or retroperitoneal lymphadenopathy. Index retrocaval node measured previously at 17 mm short axis is 4 mm short axis today (72/2). Left para-aortic node measured previously at 15 mm short axis is 6 mm short axis today on 78/2. 5.6 x 3.3 cm right pelvic sidewall lymph node measured previously is now 1.7 x 0.8 cm. Left external iliac pelvic sidewall lymph node measured previously at 2.25 x 1.6 cm now measures 1.4 x 0.5 cm. Index groin node on the right measured previously at 4.4 x 3.0 cm is now 2.6 x 1.7 cm. Reproductive: The prostate gland and seminal vesicles are unremarkable. Other: No  intraperitoneal free fluid. Musculoskeletal: No worrisome lytic or sclerotic osseous abnormality. IMPRESSION: 1.  Marked interval response to therapy with definite substantial decrease in lymphadenopathy seen previously in the chest, abdomen, and pelvis. No new or progressive lymphadenopathy on today's study. 2. Similar appearance of the distal left ureteral stone without substantial hydroureteronephrosis. Stable appearance of bladder calculi. 3. Possible tiny 5 mm polypoid soft tissue lesion posterior right bladder wall. Attention on follow-up recommended. Hematuria protocol CT scan could be used to further evaluate as clinically warranted. 4.  Aortic Atherosclerois (ICD10-170.0) Electronically Signed   By: Misty Stanley M.D.   On: 02/18/2019 15:35   CT Abdomen Pelvis W Contrast  Result Date: 02/18/2019 CLINICAL DATA:  Follicular lymphoma. EXAM: CT CHEST, ABDOMEN, AND PELVIS WITH CONTRAST TECHNIQUE: Multidetector CT imaging of the chest, abdomen and pelvis was performed following the standard protocol during bolus administration of intravenous contrast. CONTRAST:  173mL OMNIPAQUE IOHEXOL 300 MG/ML  SOLN COMPARISON:  11/03/2018 FINDINGS: CT CHEST FINDINGS Cardiovascular: The heart size is normal. No substantial pericardial effusion. Coronary artery calcification is evident. Atherosclerotic calcification is noted in the wall of the thoracic aorta. Mediastinum/Nodes: No mediastinal lymphadenopathy. There is no hilar lymphadenopathy. The esophagus has normal imaging features. Axillary lymphadenopathy is decreased in the interval. Dominant left axillary node measured previously at 2.8 x 5.5 cm is now 0.8 x 2.5 cm (14/2). Index node measured in the right axilla on the prior study at 5.0 x 2.1 cm now measures 2.1 x 0.6 cm (23/2). Similar interval decrease in the bilateral subpectoral and lower cervical lymphadenopathy seen previously. Index subpectoral node measured previously at 1.4 x 1.8 cm now measures 0.8 x 0.7 cm.  Lungs/Pleura: No suspicious pulmonary nodule or mass. No focal consolidation. No pleural effusion. Musculoskeletal: No worrisome lytic or sclerotic osseous abnormality. CT ABDOMEN PELVIS FINDINGS Hepatobiliary: No suspicious focal abnormality within the liver parenchyma. There is no evidence for gallstones, gallbladder wall thickening, or pericholecystic fluid. No intrahepatic or extrahepatic biliary dilation. Pancreas: Diffuse parenchymal calcification compatible with chronic pancreatitis. No dilatation of the main duct. Spleen: No splenomegaly. No focal mass lesion. Adrenals/Urinary Tract: No adrenal nodule or mass. Kidneys unremarkable. No evidence for hydroureter. 4 x 7 mm stone noted distal left ureter. Bladder is distended with stones in the lumen. Possible 5 mm polypoid lesion along the mucosal surface of the right bladder wall near the UVJ (see axial image 110/series 2). Stomach/Bowel: Stomach is unremarkable. No gastric wall thickening. No evidence of outlet obstruction. Duodenum is normally positioned as is the ligament of Treitz. No small bowel wall thickening. No small bowel dilatation. The terminal ileum is normal. The appendix is not visualized, but there is no edema or inflammation in the region of the cecum. No gross colonic mass. No colonic wall thickening. Vascular/Lymphatic: There is abdominal aortic atherosclerosis without aneurysm. There is no gastrohepatic or hepatoduodenal ligament lymphadenopathy. No intraperitoneal or retroperitoneal lymphadenopathy. Index retrocaval node measured previously at 17 mm short axis is 4 mm short axis today (72/2). Left para-aortic node measured previously at 15 mm short axis is 6 mm short axis today on 78/2. 5.6 x 3.3 cm right pelvic sidewall lymph node measured previously is now 1.7 x 0.8 cm. Left external iliac pelvic sidewall lymph node measured previously at 2.25 x 1.6 cm now measures 1.4 x 0.5 cm. Index groin node on the right measured previously at 4.4 x  3.0 cm is now 2.6 x 1.7 cm. Reproductive: The prostate gland and seminal vesicles are unremarkable. Other: No intraperitoneal free fluid. Musculoskeletal: No worrisome lytic or sclerotic osseous abnormality. IMPRESSION: 1. Marked  interval response to therapy with definite substantial decrease in lymphadenopathy seen previously in the chest, abdomen, and pelvis. No new or progressive lymphadenopathy on today's study. 2. Similar appearance of the distal left ureteral stone without substantial hydroureteronephrosis. Stable appearance of bladder calculi. 3. Possible tiny 5 mm polypoid soft tissue lesion posterior right bladder wall. Attention on follow-up recommended. Hematuria protocol CT scan could be used to further evaluate as clinically warranted. 4.  Aortic Atherosclerois (ICD10-170.0) Electronically Signed   By: Misty Stanley M.D.   On: 02/18/2019 15:35    ASSESSMENT & PLAN:   84 y.o. male with  1.Recently diagnosed atleast Stage III Follicular Lymphoma Grade 1-2 presenting with rt cervical lymphadenopathy  08/05/17 pathology results of right cervical LN revealing low grade follicular lymphoma  [id number] PET revealed Hypermetabolic lymphadenopathy involving the right neck, bilateral axillary, right hilar and right inguinal lymph nodes. No abdominal or pelvic involvement.    03/20/18 CT C/A/P revealed Progressive bilateral axillary and right inguinal lymphadenopathy, including necrotic lymph nodes in the right axilla, as above. Spleen is normal in size. 4 mm distal left ureteral calculus, unchanged. No hydronephrosis. Stable layering bladder calculi measuring up to 5 mm. Cholelithiasis, without associated inflammatory changes. Stable sequela of prior/chronic pancreatitis.   11/03/2018 CT neck revealed "Marked interval progression of cervical lymphadenopathy since PET-CT 09/02/2017, now with lymphadenopathy present bilaterally at essentially all stations. Index nodes as described. This includes bilateral  intraparotid lymphadenopathy. Bulky right-sided adenopathy results in multifocal narrowing of the right internal jugular vein. Significant narrowing of the mid to lower left internal jugular vein of unclear cause as there is no significant mass effect at this level."  11/03/2018 CT CAP revealed "1. Progression of bilateral axillary, retroperitoneal, pelvic, and inguinal adenopathy. New anterior and posterior lower cervical adenopathy identified within the neck. 2. Persistent distal left ureteral calculus. Small bladder calculi are unchanged. 3. Aortic Atherosclerosis (ICD10-I70.0). Coronary artery calcifications. 4. Sequelae of chronic pancreatitis noted."  2. 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 3. H/o head and neck Basal cell carcinoma Continue routine skin checks with dermatologist   PLAN: -Discussed pt labwork today, 02/28/19; all values are WNL except for Hemoglobin at 12.3, HCT at 38.7, AST at 13. -Discussed CT Abdomen Pelvis W Contrast (Accession [id number]) completed on 02/18/2019 with results revealing "1. Marked interval response to therapy with definite substantial decrease in lymphadenopathy seen previously in the chest, abdomen, and pelvis. No new or progressive lymphadenopathy on today's study. 2. Similar appearance of the distal left ureteral stone without substantial hydroureteronephrosis. Stable appearance of bladder calculi. 3. Possible tiny 5 mm polypoid soft tissue lesion posterior right bladder wall. Attention on follow-up recommended. Hematuria protocol CT scan could be used to further evaluate as clinically warranted. 4.  Aortic Atherosclerois (ICD10-170.0)." -Advised all spots have shrunk in size and there are no new spots. -Discussed setting up a referral to a urologist for polyp in bladder. -Advised that he is responding well to treatment with rituxan. -Discussed 03/04/19 hemoglobin at 12.3, anemia has resolved -Discussed 03/04/19 blood counts and blood chemistries,  normal. -Recommended to follow up with Dr.Gibson for back pain. -Recommended elevating legs and using compression socks for leg swelling. Also advised to watch sodium intake. -Will see back in 4 - 6 months.    FOLLOW UP: -Urology referral for bladder polyp r/o bladder cancer (patient prefer appointment early January after holidays) -RTC with Dr Irene Limbo with labs in 4 months  The total time spent in the appt was 25 minutes and  more than 50% was on counseling and direct patient cares.  All of the patient's questions were answered with apparent satisfaction. The patient knows to call the clinic with any problems, questions or concerns.  Sullivan Lone MD MS AAHIVMS [facility] Hematology/Oncology Physician [facility]  (Office):       [phone] (Work cell):  [phone] (Fax):           [phone]  03/04/2019 1:39 PM  I, Scot Dock, am acting as a scribe for Dr. Sullivan Lone.   .I have reviewed the above documentation for accuracy and completeness, and I agree with the above. Brunetta Genera MD

## 2019-03-04 ENCOUNTER — Telehealth: Payer: Self-pay | Admitting: Hematology

## 2019-03-04 ENCOUNTER — Inpatient Hospital Stay: Payer: Medicare Other | Admitting: Hematology

## 2019-03-04 ENCOUNTER — Other Ambulatory Visit: Payer: Self-pay

## 2019-03-04 VITALS — BP 148/68 | HR 70 | Temp 98.2°F | Resp 18 | Ht 70.0 in | Wt 171.7 lb

## 2019-03-04 DIAGNOSIS — C8208 Follicular lymphoma grade I, lymph nodes of multiple sites: Secondary | ICD-10-CM

## 2019-03-04 DIAGNOSIS — N219 Calculus of lower urinary tract, unspecified: Secondary | ICD-10-CM

## 2019-03-04 DIAGNOSIS — N329 Bladder disorder, unspecified: Secondary | ICD-10-CM | POA: Diagnosis not present

## 2019-03-04 DIAGNOSIS — C8201 Follicular lymphoma grade I, lymph nodes of head, face, and neck: Secondary | ICD-10-CM | POA: Diagnosis not present

## 2019-03-04 NOTE — Telephone Encounter (Signed)
Scheduled appt per 12/17 los.  Printed calendar and avs. 

## 2019-03-10 DIAGNOSIS — N3289 Other specified disorders of bladder: Secondary | ICD-10-CM | POA: Insufficient documentation

## 2019-04-08 ENCOUNTER — Telehealth: Payer: Self-pay | Admitting: Cardiovascular Disease

## 2019-04-08 NOTE — Telephone Encounter (Signed)
New Message     [facility] Pre-operative Risk Assessment    Request for surgical clearance:  1. What type of surgery is being performed? Bladder and Kidney Stone Removal and Ureteroscopy   2. When is this surgery scheduled? 05/12/19   3. What type of clearance is required (medical clearance vs. Pharmacy clearance to hold med vs. Both)? Medical  4. Are there any medications that need to be held prior to surgery and how long? N/A   5. Practice name and name of physician performing surgery? Dr. Louis Meckel, [facility]  6. What is your office phone number [phone]   7.   What is your office fax number [phone]  8.   Anesthesia type (None, local, MAC, general) ? General   Harold [name] 04/08/2019, 8:34 AM  _________________________________________________________________   (provider comments below)

## 2019-04-08 NOTE — Telephone Encounter (Signed)
Primary Cardiologist:Harold Angelena Form, MD  Chart reviewed as part of pre-operative protocol coverage. Because of Harold [name]'s past medical history and time since last visit, he/she will require a follow-up visit in order to better assess preoperative cardiovascular risk  He has not been seen since January 2020. Will need to be seen for evaluation.    Pre-op covering staff: - Please schedule appointment and call patient to inform them. - Please contact requesting surgeon's office via preferred method (i.e, phone, fax) to inform them of need for appointment prior to surgery.  If applicable, this message will also be routed to pharmacy pool and/or primary cardiologist for input on holding anticoagulant/antiplatelet agent as requested below so that this information is available at time of patient's appointment.   Harold Sims, NP  04/08/2019, 9:15 AM

## 2019-04-08 NOTE — Telephone Encounter (Signed)
Pt has appt with Dr. Angelena Form 04/26/19. I will send a note to surgeon's office pt has appt 04/26/19 for pre op clearance. I will forward clearance note to MD for upcoming appt. I will remove from the pre op call back pool.

## 2019-04-26 ENCOUNTER — Other Ambulatory Visit: Payer: Self-pay

## 2019-04-26 ENCOUNTER — Encounter: Payer: Self-pay | Admitting: Cardiovascular Disease

## 2019-04-26 ENCOUNTER — Ambulatory Visit: Payer: Medicare Other | Admitting: Cardiovascular Disease

## 2019-04-26 VITALS — BP 114/62 | HR 61 | Ht 70.0 in | Wt 175.8 lb

## 2019-04-26 DIAGNOSIS — I5032 Chronic diastolic (congestive) heart failure: Secondary | ICD-10-CM

## 2019-04-26 DIAGNOSIS — I10 Essential (primary) hypertension: Secondary | ICD-10-CM | POA: Diagnosis not present

## 2019-04-26 DIAGNOSIS — I2510 Atherosclerotic heart disease of native coronary artery without angina pectoris: Secondary | ICD-10-CM

## 2019-04-26 DIAGNOSIS — E7800 Pure hypercholesterolemia, unspecified: Secondary | ICD-10-CM

## 2019-04-26 MED ORDER — FUROSEMIDE 20 MG PO TABS: 20.0000 mg | ORAL_TABLET | Freq: Every day | ORAL | 3 refills | Status: DC

## 2019-04-26 NOTE — Progress Notes (Signed)
Chief Complaint  Patient presents with  . Follow-up    CAD   History of Present Illness: 85 yo male with history of CAD, HTN, follicular lymphoma and HLD here today for cardiac follow up. He underwent catheterization in October 2011 because of chest pain and was found to have a severe stenosis in a large posterolateral branch of the right coronary artery and a severe stenosis in the proximal LAD with complete occlusion of the distal LAD. Drug eluting stents were placed in the posterior lateral branch of the right coronary and the mid LAD in October 2011. Exercise stress test 10/04/11 with no evidence of ischemia. He was diagnosed with lymphoma in March 2019. This was found when he had a mass on his neck. This was treated with resection. He has been treated with steroids short term for pain from his right axillary nodes. He has completed chemotherapy. Echo January 2020 with LVEF=60-65%. No significant valve disease.   He is here today for follow up. The patient denies any chest pain, dyspnea, palpitations, lower extremity edema, orthopnea, PND, dizziness, near syncope or syncope. He has upcoming procedure to remove kidney stones. He has been very active. BP has been lower at home. HCTZ stopped in primary care but he had more swelling in his legs so he restarted it. Still having some LE edema.    Primary Care Physician: [name], MD  Past Medical History:  Diagnosis Date  . BPH (benign prostatic hypertrophy)   . CAD (coronary artery disease) 01/2010   S/P drug eluting stents LAD and PL branch of RCA Dr. Olevia Perches  . follicular lymphoma dx'd 06/2017  . Hernia   . HLD (hyperlipidemia)   . HTN (hypertension)     Past Surgical History:  Procedure Laterality Date  . BACK SURGERY    . HERNIA REPAIR  01/08/11   RIH  . MASS EXCISION Right 08/05/2017   Procedure: EXCISION RIGHT POSTERIOR  NECK LYMPH NODE;  Surgeon: Rozetta Nunnery, MD;  Location: [facility];  Service: ENT;   Laterality: Right;    Current Outpatient Medications  Medication Sig Dispense Refill  . allopurinol (ZYLOPRIM) 100 MG tablet Take 1 tablet (100 mg total) by mouth 2 (two) times daily. 30 tablet 0  . aspirin 81 MG tablet Take 81 mg by mouth daily.      [name] atenolol (TENORMIN) 25 MG tablet Take 1 tablet (25 mg total) by mouth daily. 90 tablet 3  . brimonidine (ALPHAGAN) 0.2 % ophthalmic solution Place 1 drop into the right eye 2 (two) times daily.      . cholecalciferol (VITAMIN D) 1000 UNITS tablet Take 1,000 Units by mouth daily.      . finasteride (PROSCAR) 5 MG tablet Take 5 mg by mouth daily.      [name] gabapentin (NEURONTIN) 100 MG capsule Take 1 capsule by mouth as needed.    . isosorbide dinitrate (ISORDIL) 5 MG tablet Take 1 tablet (5 mg total) by mouth 2 (two) times daily. 180 tablet 3  . lisinopril (PRINIVIL,ZESTRIL) 10 MG tablet Take 1 tablet (10 mg total) by mouth daily. 90 tablet 3  . ondansetron (ZOFRAN) 8 MG tablet Take 1 tablet (8 mg total) by mouth every 8 (eight) hours as needed for nausea. 30 tablet 0  . pravastatin (PRAVACHOL) 40 MG tablet Take 1 tablet (40 mg total) by mouth at bedtime. 90 tablet 3  . timolol (TIMOPTIC) 0.5 % ophthalmic solution Place 1 drop into the right  eye daily.      . furosemide (LASIX) 20 MG tablet Take 1 tablet (20 mg total) by mouth daily. 90 tablet 3  . nitroGLYCERIN (NITROSTAT) 0.4 MG SL tablet Place 1 tablet (0.4 mg total) under the tongue every 5 (five) minutes as needed for chest pain. 25 tablet 6   No current facility-administered medications for this visit.    No Known Allergies  Social History   Socioeconomic History  . Marital status: Married    Spouse name: Not on file  . Number of children: Not on file  . Years of education: Not on file  . Highest education level: Not on file  Occupational History  . Not on file  Tobacco Use  . Smoking status: Never Smoker  . Smokeless tobacco: Never Used  Substance and Sexual Activity  .  Alcohol use: No  . Drug use: No  . Sexual activity: Not on file  Other Topics Concern  . Not on file  Social History Narrative   Married   Social Determinants of Health   Financial Resource Strain:   . Difficulty of Paying Living Expenses: Not on file  Food Insecurity:   . Worried About Charity fundraiser in the Last Year: Not on file  . Ran Out of Food in the Last Year: Not on file  Transportation Needs:   . Lack of Transportation (Medical): Not on file  . Lack of Transportation (Non-Medical): Not on file  Physical Activity:   . Days of Exercise per Week: Not on file  . Minutes of Exercise per Session: Not on file  Stress:   . Feeling of Stress : Not on file  Social Connections:   . Frequency of Communication with Friends and Family: Not on file  . Frequency of Social Gatherings with Friends and Family: Not on file  . Attends Religious Services: Not on file  . Active Member of Clubs or Organizations: Not on file  . Attends Archivist Meetings: Not on file  . Marital Status: Not on file  Intimate Partner Violence:   . Fear of Current or Ex-Partner: Not on file  . Emotionally Abused: Not on file  . Physically Abused: Not on file  . Sexually Abused: Not on file    Family History  Problem Relation Age of Onset  . Heart failure Mother     Review of Systems:  As stated in the HPI and otherwise negative.   BP 114/62   Pulse 61   Ht 5\' 10"  (1.778 m)   Wt 175 lb 12.8 oz (79.7 kg)   SpO2 98%   BMI 25.22 kg/m   Physical Examination:  General: Well developed, well nourished, NAD  HEENT: OP clear, mucus membranes moist  SKIN: warm, dry. No rashes. Neuro: No focal deficits  Musculoskeletal: Muscle strength 5/5 all ext  Psychiatric: Mood and affect normal  Neck: No JVD, no carotid bruits, no thyromegaly, no lymphadenopathy.  Lungs:Clear bilaterally, no wheezes, rhonci, crackles Cardiovascular: Regular rate and rhythm. No murmurs, gallops or  rubs. Abdomen:Soft. Bowel sounds present. Non-tender.  Extremities: Trace to 1+ bilateral lower extremity edema. Pulses are 2 + in the bilateral DP/PT.  EKG:  EKG is ordered today. The ekg ordered today demonstrates NSR, rate 61 bpm.   Echo January 2020: 1. The left ventricle has normal systolic function of 123456. The cavity  size is normal. There is no left ventricular wall thickness. Echo evidence  of impaired relaxation diastolic filling patterns.  2.  Mildly dilated left atrial size.  3. Normal right atrial size.  4. The mitral valve is degenerative. There is moderate thickening and  moderately calcified. Regurgitation is not visualized by color flow  Doppler.  5. Normal tricuspid valve.  6. The aortic valve tricuspid. There is moderate thickening and moderate  calcification of the aortic valve.  7. No atrial level shunt detected by color flow Doppler.   Recent Labs: 11/12/2018: Magnesium 1.6 02/18/2019: ALT 9; BUN 23; Creatinine 0.87; Hemoglobin 12.3; Platelets 243; Potassium 4.0; Sodium 142   Lipid Panel No results found for: CHOL, TRIG, HDL, CHOLHDL, VLDL, LDLCALC, LDLDIRECT   Wt Readings from Last 3 Encounters:  04/26/19 175 lb 12.8 oz (79.7 kg)  03/04/19 171 lb 11.2 oz (77.9 kg)  11/26/18 164 lb 9.6 oz (74.7 kg)     Other studies Reviewed: Additional studies/ records that were reviewed today include:  Review of the above records demonstrates:    Assessment and Plan:   1. CAD without angina: He has no chest pain. Echo January 2020 with normal LV systolic function. Will continue ASA, beta blocker, Imdur and statin. Of note, he was a non-responder to Plavix.   2. HTN: BP is controlled. BMET from December 2020 reviewed by me today. Renal function and electrolytes are normal. His BP has been on the lower side at home. Will stop HCTZ.   3. Hyperlipidemia: Lipids followed in primary care. Continue statin.   4. Chronic diastolic CHF: Weight is stable. Mild LE  edema. Will stop HCTZ due to hypotension and start Lasix 20 mg daily.   5. Pre-operative cardiovascular examination: He is active and has no chest pain or concerning cardiac findings. EKG unchanged. He can proceed with his planned surgical procedure without further cardiac workup.   Current medicines are reviewed at length with the patient today.  The patient does not have concerns regarding medicines.  The following changes have been made:  no change  Labs/ tests ordered today include:   Orders Placed This Encounter  Procedures  . EKG 12-Lead    Disposition:   FU with me in 12 months  Signed, Lauree Chandler, MD 04/26/2019 11:41 AM    [facility] [address], [place], [place]  [address] Phone: [phone]; Fax: [phone]

## 2019-04-26 NOTE — Patient Instructions (Signed)
Medication Instructions:  Your physician has recommended you make the following change in your medication:  1.) stop hctz (hydrochlorothiazide) 2.) start Lasix (furosemide) 20 mg once a day  *If you need a refill on your cardiac medications before your next appointment, please call your pharmacy*  Lab Work: none If you have labs (blood work) drawn today and your tests are completely normal, you will receive your results only by: [name] MyChart Message (if you have MyChart) OR . A paper copy in the mail If you have any lab test that is abnormal or we need to change your treatment, we will call you to review the results.  Testing/Procedures: none  Follow-Up: At [facility] ([place]), you and your health needs are our priority.  As part of our continuing mission to provide you with exceptional heart care, we have created designated Provider Care Teams.  These Care Teams include your primary Cardiologist (physician) and Advanced Practice Providers (APPs -  Physician Assistants and Nurse Practitioners) who all work together to provide you with the care you need, when you need it.  Your next appointment:   6 month(s)  The format for your next appointment:   In Person  Provider:   You may see Lauree Chandler, MD or one of the following Advanced Practice Providers on your designated Care Team:    Melina Copa, PA-C  Ermalinda Barrios, PA-C   Other Instructions

## 2019-04-30 ENCOUNTER — Telehealth: Payer: Self-pay

## 2019-04-30 NOTE — Telephone Encounter (Signed)
Patient called and LMVM advising he needed to cancel appt he had scheduled with Dr Durward Fortes on next Wednesday due to getting COVID vaccine. I tried to cancel but no appt scheduled.

## 2019-05-05 ENCOUNTER — Ambulatory Visit: Payer: Medicare Other | Admitting: Orthopaedic Surgery

## 2019-05-11 ENCOUNTER — Other Ambulatory Visit: Payer: Self-pay | Admitting: Urology

## 2019-05-14 NOTE — Patient Instructions (Addendum)
DUE TO COVID-19 ONLY ONE VISITOR IS ALLOWED TO COME WITH YOU AND STAY IN THE WAITING ROOM ONLY DURING PRE OP AND PROCEDURE DAY OF SURGERY. THE 1 VISITOR MAY VISIT WITH YOU AFTER SURGERY IN YOUR PRIVATE ROOM DURING VISITING HOURS ONLY!  YOU NEED TO HAVE A COVID 19 TEST ON_3/1/21______ @__2 :25_____, THIS TEST MUST BE DONE BEFORE SURGERY, COME  Harold [name] , [id number].  ([place]) ONCE YOUR COVID TEST IS COMPLETED, PLEASE BEGIN THE QUARANTINE INSTRUCTIONS AS OUTLINED IN YOUR HANDOUT.                Harold [name]    Your procedure is scheduled on: 05/20/19   Report to [facility] Main  Entrance   Report to admitting at  9:35 AM     Call this number if you have problems the morning of surgery [phone]    Remember: Do not eat food or drink liquids :After Midnight.   BRUSH YOUR TEETH MORNING OF SURGERY AND RINSE YOUR MOUTH OUT, NO CHEWING GUM CANDY OR MINTS.     Take these medicines the morning of surgery with A SIP OF WATER: Gabapentin, Atenolol, Allopurinol,Finesteride, use eye drops                                 You may not have any metal on your body including               piercings  Do not wear jewelry,  lotions, powders or  deodorant                  Men may shave face and neck.   Do not bring valuables to the hospital. [place].  Contacts, dentures or bridgework may not be worn into surgery.      Patients discharged the day of surgery will not be allowed to drive home.  IF YOU ARE HAVING SURGERY AND GOING HOME THE SAME DAY, YOU MUST HAVE AN ADULT TO DRIVE YOU HOME AND BE WITH YOU FOR 24 HOURS.  YOU MAY GO HOME BY TAXI OR UBER OR ORTHERWISE, BUT AN ADULT MUST ACCOMPANY YOU HOME AND STAY WITH YOU FOR 24 HOURS.  Name and phone number of your driver:  Special Instructions: N/A              Please read over the following fact sheets you were  given: _____________________________________________________________________             [facility] - Preparing for Surgery  Before surgery, you can play an important role.   Because skin is not sterile, your skin needs to be as free of germs as possible   You can reduce the number of germs on your skin by washing with CHG (chlorahexidine gluconate) soap before surgery   CHG is an antiseptic cleaner which kills germs and bonds with the skin to continue killing germs even after washing. Please DO NOT use if you have an allergy to CHG or antibacterial soaps.   If your skin becomes reddened/irritated stop using the CHG and inform your nurse when you arrive at Short Stay.   You may shave your face/neck  Please follow these instructions carefully:  1.  Shower with CHG Soap the night before surgery and the  morning of  Surgery.  2.  If you choose to wash your hair, wash your hair first as usual with your  normal  shampoo.  3.  After you shampoo, rinse your hair and body thoroughly to remove the  shampoo.                                        4.  Use CHG as you would any other liquid soap.  You can apply chg directly  to the skin and wash                       Gently with a scrungie or clean washcloth.  5.  Apply the CHG Soap to your body ONLY FROM THE NECK DOWN.   Do not use on face/ open                           Wound or open sores. Avoid contact with eyes, ears mouth and genitals (private parts).                       Wash face,  Genitals (private parts) with your normal soap.             6.  Wash thoroughly, paying special attention to the area where your surgery  will be performed.  7.  Thoroughly rinse your body with warm water from the neck down.  8.  DO NOT shower/wash with your normal soap after using and rinsing off  the CHG Soap.             9.  Pat yourself dry with a clean towel.            10.  Wear clean pajamas.            11.  Place clean sheets on your bed the night of your  first shower and do not  sleep with pets. Day of Surgery : Do not apply any lotions/deodorants the morning of surgery.  Please wear clean clothes to the hospital/surgery center.  FAILURE TO FOLLOW THESE INSTRUCTIONS MAY RESULT IN THE CANCELLATION OF YOUR SURGERY PATIENT SIGNATURE_________________________________  NURSE SIGNATURE__________________________________  ________________________________________________________________________

## 2019-05-17 ENCOUNTER — Other Ambulatory Visit (HOSPITAL_COMMUNITY)
Admission: RE | Admit: 2019-05-17 | Discharge: 2019-05-17 | Disposition: A | Discharge status: Home / Self Care | Payer: Medicare Other | Source: Ambulatory Visit | Attending: Urology | Admitting: Urology

## 2019-05-17 ENCOUNTER — Encounter (HOSPITAL_COMMUNITY)
Admission: RE | Admit: 2019-05-17 | Discharge: 2019-05-17 | Disposition: A | Discharge status: Home / Self Care | Payer: Medicare Other | Source: Ambulatory Visit | Attending: Urology | Admitting: Urology

## 2019-05-17 ENCOUNTER — Encounter (HOSPITAL_COMMUNITY): Payer: Self-pay

## 2019-05-17 ENCOUNTER — Other Ambulatory Visit: Payer: Self-pay

## 2019-05-17 DIAGNOSIS — Z955 Presence of coronary angioplasty implant and graft: Secondary | ICD-10-CM | POA: Insufficient documentation

## 2019-05-17 DIAGNOSIS — N210 Calculus in bladder: Secondary | ICD-10-CM | POA: Insufficient documentation

## 2019-05-17 DIAGNOSIS — I2510 Atherosclerotic heart disease of native coronary artery without angina pectoris: Secondary | ICD-10-CM | POA: Diagnosis not present

## 2019-05-17 DIAGNOSIS — Z20822 Contact with and (suspected) exposure to covid-19: Secondary | ICD-10-CM | POA: Insufficient documentation

## 2019-05-17 DIAGNOSIS — E785 Hyperlipidemia, unspecified: Secondary | ICD-10-CM | POA: Diagnosis not present

## 2019-05-17 DIAGNOSIS — Z01818 Encounter for other preprocedural examination: Secondary | ICD-10-CM | POA: Diagnosis present

## 2019-05-17 DIAGNOSIS — Z79899 Other long term (current) drug therapy: Secondary | ICD-10-CM | POA: Insufficient documentation

## 2019-05-17 DIAGNOSIS — Z8572 Personal history of non-Hodgkin lymphomas: Secondary | ICD-10-CM | POA: Insufficient documentation

## 2019-05-17 DIAGNOSIS — N201 Calculus of ureter: Secondary | ICD-10-CM | POA: Diagnosis not present

## 2019-05-17 DIAGNOSIS — I10 Essential (primary) hypertension: Secondary | ICD-10-CM | POA: Diagnosis not present

## 2019-05-17 DIAGNOSIS — N400 Enlarged prostate without lower urinary tract symptoms: Secondary | ICD-10-CM | POA: Diagnosis not present

## 2019-05-17 DIAGNOSIS — Z7982 Long term (current) use of aspirin: Secondary | ICD-10-CM | POA: Insufficient documentation

## 2019-05-17 LAB — BASIC METABOLIC PANEL
Anion gap: 5 (ref 5–15)
BUN: 21 mg/dL (ref 8–23)
CO2: 29 mmol/L (ref 22–32)
Calcium: 9.4 mg/dL (ref 8.9–10.3)
Chloride: 106 mmol/L (ref 98–111)
Creatinine, Ser: 0.95 mg/dL (ref 0.61–1.24)
GFR calc Af Amer: >60 mL/min (ref >60)
GFR calc non Af Amer: >60 mL/min (ref >60)
Glucose, Bld: 97 mg/dL (ref 70–99)
Potassium: 4 mmol/L (ref 3.5–5.1)
Sodium: 140 mmol/L (ref 135–145)

## 2019-05-17 LAB — CBC
HCT: 36.9 % — ABNORMAL LOW (ref 39.0–52.0)
Hemoglobin: 11.9 g/dL — ABNORMAL LOW (ref 13.0–17.0)
MCH: 29 pg (ref 26.0–34.0)
MCHC: 32.2 g/dL (ref 30.0–36.0)
MCV: 89.8 fL (ref 80.0–100.0)
Platelets: 232 10*3/uL (ref 150–400)
RBC: 4.11 MIL/uL — ABNORMAL LOW (ref 4.22–5.81)
RDW: 13.3 % (ref 11.5–15.5)
WBC: 6 10*3/uL (ref 4.0–10.5)
nRBC: 0 % (ref 0.0–0.2)

## 2019-05-17 NOTE — Progress Notes (Signed)
PCP - Dr. Hilbert Bible Cardiologist - Dr C. [place]  Chest x-ray - 02/18/19 EKG - 04/26/19 Stress Test - no ECHO - 04/16/18 Cardiac Cath - stent.   Sleep Study - no CPAP -   Fasting Blood Sugar - NA Checks Blood Sugar _____ times a day  Blood Thinner Instructions:ASA Aspirin Instructions:Herrick said stop 5 days prior. Last Dose:2/28  Anesthesia review:   Patient denies shortness of breath, fever, cough and chest pain at PAT appointment yes  Patient verbalized understanding of instructions that were given to them at the PAT appointment. Patient was also instructed that they will need to review over the PAT instructions again at home before surgery. yes

## 2019-05-18 LAB — SARS CORONAVIRUS 2 (TAT 6-24 HRS): SARS Coronavirus 2: NEGATIVE

## 2019-05-18 NOTE — Progress Notes (Signed)
Anesthesia Chart Review   Case: [id number] Date/Time: 05/20/19 1120   Procedures:      CYSTOSCOPY WITH LITHOLAPAXY (N/A )     CYSTOSCOPY/URETEROSCOPY/HOLMIUM LASER/STENT PLACEMENT (Left )   Anesthesia type: General   Pre-op diagnosis: BLADDER STONE LEFT URETERAL STONE   Location: [facility] PROCEDURE [facility] / [facility]   Surgeons: Ardis Hughs, MD      DISCUSSION:85 y.o. never smoker with h/o HTN, HLD, BPH, CAD (DES 2011), bladder stone left ureteral stone scheduled for above procedure 05/20/19 with Dr. Louis Meckel.   Pt seen by cardiologist, Dr. Angelena Form, 04/26/2019.  Per OV note, "He is active and has no chest pain or concerning cardiac findings. EKG unchanged. He can proceed with his planned surgical procedure without further cardiac workup."  Anticipate pt can proceed with planned procedure barring acute status change.   VS: There were no vitals taken for this visit.  PROVIDERS: [name], MD is PCP   Keturah Barre, MD is Cardiologist  LABS: Labs reviewed: Acceptable for surgery. (all labs ordered are listed, but only abnormal results are displayed)  Labs Reviewed - No data to display   IMAGES:   EKG: 04/26/2019 Rate 61 bpm  Normal sinus rhythm   CV: Echo 04/16/2018 IMPRESSIONS   1. The left ventricle has normal systolic function of 123456. The cavity  size is normal. There is no left ventricular wall thickness. Echo evidence  of impaired relaxation diastolic filling patterns.  2. Mildly dilated left atrial size.  3. Normal right atrial size.  4. The mitral valve is degenerative. There is moderate thickening and  moderately calcified. Regurgitation is not visualized by color flow  Doppler.  5. Normal tricuspid valve.  6. The aortic valve tricuspid. There is moderate thickening and moderate  calcification of the aortic valve.  7. No atrial level shunt detected by color flow Doppler.  Past Medical History:  Diagnosis Date  . BPH (benign prostatic  hypertrophy)   . CAD (coronary artery disease) 01/2010   S/P drug eluting stents LAD and PL branch of RCA Dr. Olevia Perches  . follicular lymphoma dx'd 06/2017  . Hernia   . HLD (hyperlipidemia)   . HTN (hypertension)     Past Surgical History:  Procedure Laterality Date  . BACK SURGERY    . HERNIA REPAIR  01/08/11   RIH  . MASS EXCISION Right 08/05/2017   Procedure: EXCISION RIGHT POSTERIOR  NECK LYMPH NODE;  Surgeon: Rozetta Nunnery, MD;  Location: [facility];  Service: ENT;  Laterality: Right;    MEDICATIONS: . allopurinol (ZYLOPRIM) 100 MG tablet  . aspirin 81 MG tablet  . atenolol (TENORMIN) 25 MG tablet  . brimonidine (ALPHAGAN) 0.2 % ophthalmic solution  . cholecalciferol (VITAMIN D) 1000 UNITS tablet  . Cyanocobalamin (B-12) 2500 MCG TABS  . finasteride (PROSCAR) 5 MG tablet  . furosemide (LASIX) 20 MG tablet  . gabapentin (NEURONTIN) 100 MG capsule  . isosorbide dinitrate (ISORDIL) 5 MG tablet  . lisinopril (PRINIVIL,ZESTRIL) 10 MG tablet  . nitroGLYCERIN (NITROSTAT) 0.4 MG SL tablet  . ondansetron (ZOFRAN) 8 MG tablet  . pravastatin (PRAVACHOL) 40 MG tablet  . timolol (TIMOPTIC) 0.5 % ophthalmic solution   No current facility-administered medications for this encounter.    Maia Plan [facility] Pre-Surgical Testing [phone] 05/18/19  11:37 AM

## 2019-05-20 ENCOUNTER — Ambulatory Visit (HOSPITAL_COMMUNITY): Payer: Medicare Other | Admitting: Physician Assistant

## 2019-05-20 ENCOUNTER — Encounter (HOSPITAL_COMMUNITY): Payer: Self-pay | Admitting: Urology

## 2019-05-20 ENCOUNTER — Ambulatory Visit (HOSPITAL_COMMUNITY)
Admission: RE | Admit: 2019-05-20 | Discharge: 2019-05-20 | Disposition: A | Discharge status: Home / Self Care | Payer: Medicare Other | Attending: Urology | Admitting: Urology

## 2019-05-20 ENCOUNTER — Encounter (HOSPITAL_COMMUNITY)
Admission: RE | Disposition: A | Discharge status: Home / Self Care | Payer: Self-pay | Source: Home / Self Care | Attending: Urology

## 2019-05-20 ENCOUNTER — Ambulatory Visit (HOSPITAL_COMMUNITY): Payer: Medicare Other | Admitting: Anesthesiology

## 2019-05-20 ENCOUNTER — Ambulatory Visit (HOSPITAL_COMMUNITY): Payer: Medicare Other

## 2019-05-20 DIAGNOSIS — N210 Calculus in bladder: Secondary | ICD-10-CM

## 2019-05-20 DIAGNOSIS — Z7982 Long term (current) use of aspirin: Secondary | ICD-10-CM | POA: Diagnosis not present

## 2019-05-20 DIAGNOSIS — I2510 Atherosclerotic heart disease of native coronary artery without angina pectoris: Secondary | ICD-10-CM | POA: Insufficient documentation

## 2019-05-20 DIAGNOSIS — I10 Essential (primary) hypertension: Secondary | ICD-10-CM | POA: Diagnosis not present

## 2019-05-20 DIAGNOSIS — Z79899 Other long term (current) drug therapy: Secondary | ICD-10-CM | POA: Insufficient documentation

## 2019-05-20 DIAGNOSIS — N201 Calculus of ureter: Secondary | ICD-10-CM | POA: Insufficient documentation

## 2019-05-20 DIAGNOSIS — Z8572 Personal history of non-Hodgkin lymphomas: Secondary | ICD-10-CM | POA: Insufficient documentation

## 2019-05-20 DIAGNOSIS — Z955 Presence of coronary angioplasty implant and graft: Secondary | ICD-10-CM | POA: Diagnosis not present

## 2019-05-20 DIAGNOSIS — H409 Unspecified glaucoma: Secondary | ICD-10-CM | POA: Diagnosis not present

## 2019-05-20 HISTORY — PX: CYSTOSCOPY WITH LITHOLAPAXY: SHX1425

## 2019-05-20 HISTORY — PX: CYSTOSCOPY/URETEROSCOPY/HOLMIUM LASER/STENT PLACEMENT: SHX6546

## 2019-05-20 SURGERY — CYSTOSCOPY, WITH BLADDER CALCULUS LITHOLAPAXY
Anesthesia: General

## 2019-05-20 MED ORDER — PROPOFOL 10 MG/ML IV BOLUS
INTRAVENOUS | Status: DC | PRN
  Administered 2019-05-20: 120 mg via INTRAVENOUS

## 2019-05-20 MED ORDER — ONDANSETRON HCL 4 MG/2ML IJ SOLN
INTRAMUSCULAR | Status: DC | PRN
  Administered 2019-05-20: 4 mg via INTRAVENOUS

## 2019-05-20 MED ORDER — BELLADONNA ALKALOIDS-OPIUM 16.2-30 MG RE SUPP
RECTAL | Status: AC
  Filled 2019-05-20: qty 1

## 2019-05-20 MED ORDER — ONDANSETRON HCL 4 MG/2ML IJ SOLN
INTRAMUSCULAR | Status: AC
  Filled 2019-05-20: qty 2

## 2019-05-20 MED ORDER — CEFAZOLIN SODIUM-DEXTROSE 2-4 GM/100ML-% IV SOLN
2.0000 g | INTRAVENOUS | Status: AC
  Administered 2019-05-20: 2 g via INTRAVENOUS
  Filled 2019-05-20: qty 100

## 2019-05-20 MED ORDER — PROPOFOL 10 MG/ML IV BOLUS
INTRAVENOUS | Status: AC
  Filled 2019-05-20: qty 20

## 2019-05-20 MED ORDER — LACTATED RINGERS IV SOLN: INTRAVENOUS | Status: DC

## 2019-05-20 MED ORDER — IOHEXOL 300 MG/ML  SOLN
INTRAMUSCULAR | Status: DC | PRN
  Administered 2019-05-20: 10 mL

## 2019-05-20 MED ORDER — 0.9 % SODIUM CHLORIDE (POUR BTL) OPTIME
TOPICAL | Status: DC | PRN
  Administered 2019-05-20: 1000 mL

## 2019-05-20 MED ORDER — OXYCODONE HCL 5 MG PO TABS: 5.0000 mg | ORAL_TABLET | Freq: Once | ORAL | Status: DC | PRN

## 2019-05-20 MED ORDER — TRAMADOL HCL 50 MG PO TABS: 50.0000 mg | ORAL_TABLET | Freq: Four times a day (QID) | ORAL | 0 refills | Status: DC | PRN

## 2019-05-20 MED ORDER — LIDOCAINE HCL 1 % IJ SOLN
INTRAMUSCULAR | Status: DC | PRN
  Administered 2019-05-20: 50 mg via INTRADERMAL

## 2019-05-20 MED ORDER — DEXAMETHASONE SODIUM PHOSPHATE 10 MG/ML IJ SOLN
INTRAMUSCULAR | Status: DC | PRN
  Administered 2019-05-20: 8 mg via INTRAVENOUS

## 2019-05-20 MED ORDER — HYDROMORPHONE HCL 1 MG/ML IJ SOLN: 0.2500 mg | INTRAMUSCULAR | Status: DC | PRN

## 2019-05-20 MED ORDER — PHENAZOPYRIDINE HCL 200 MG PO TABS: 200.0000 mg | ORAL_TABLET | Freq: Three times a day (TID) | ORAL | 0 refills | Status: DC | PRN

## 2019-05-20 MED ORDER — FENTANYL CITRATE (PF) 100 MCG/2ML IJ SOLN
INTRAMUSCULAR | Status: AC
  Filled 2019-05-20: qty 2

## 2019-05-20 MED ORDER — PROMETHAZINE HCL 25 MG/ML IJ SOLN: 6.2500 mg | INTRAMUSCULAR | Status: DC | PRN

## 2019-05-20 MED ORDER — FENTANYL CITRATE (PF) 100 MCG/2ML IJ SOLN
INTRAMUSCULAR | Status: DC | PRN
  Administered 2019-05-20: 25 ug via INTRAVENOUS
  Administered 2019-05-20: 50 ug via INTRAVENOUS
  Administered 2019-05-20: 25 ug via INTRAVENOUS

## 2019-05-20 MED ORDER — CIPROFLOXACIN HCL 500 MG PO TABS: 500.0000 mg | ORAL_TABLET | Freq: Once | ORAL | 0 refills | Status: AC

## 2019-05-20 MED ORDER — OXYCODONE HCL 5 MG/5ML PO SOLN: 5.0000 mg | Freq: Once | ORAL | Status: DC | PRN

## 2019-05-20 MED ORDER — SODIUM CHLORIDE 0.9 % IR SOLN
Status: DC | PRN
  Administered 2019-05-20: 3000 mL via INTRAVESICAL

## 2019-05-20 MED ORDER — LIDOCAINE HCL URETHRAL/MUCOSAL 2 % EX GEL
CUTANEOUS | Status: AC
  Filled 2019-05-20: qty 30

## 2019-05-20 SURGICAL SUPPLY — 22 items
BAG URO CATCHER STRL LF (MISCELLANEOUS) ×3 IMPLANT
BASKET ZERO TIP NITINOL 2.4FR (BASKET) IMPLANT
CATH URET 5FR 28IN OPEN ENDED (CATHETERS) ×3 IMPLANT
CLOTH BEACON ORANGE TIMEOUT ST (SAFETY) IMPLANT
EXTRACTOR STONE 1.7FRX115CM (UROLOGICAL SUPPLIES) ×3 IMPLANT
FIBER LASER FLEXIVA 365 (UROLOGICAL SUPPLIES) ×3 IMPLANT
GLOVE BIOGEL M STRL SZ7.5 (GLOVE) ×3 IMPLANT
GOWN STRL REUS W/TWL XL LVL3 (GOWN DISPOSABLE) ×3 IMPLANT
GUIDEWIRE ANG ZIPWIRE 038X150 (WIRE) IMPLANT
GUIDEWIRE STR DUAL SENSOR (WIRE) ×3 IMPLANT
KIT TURNOVER KIT A (KITS) ×3 IMPLANT
MANIFOLD NEPTUNE II (INSTRUMENTS) ×3 IMPLANT
PACK CYSTO (CUSTOM PROCEDURE TRAY) ×3 IMPLANT
PENCIL SMOKE EVACUATOR (MISCELLANEOUS) IMPLANT
SHEATH URETERAL 12FRX28CM (UROLOGICAL SUPPLIES) IMPLANT
SHEATH URETERAL 12FRX35CM (MISCELLANEOUS) IMPLANT
STENT CONTOUR 6FRX26X.038 (STENTS) ×3 IMPLANT
SYR TOOMEY IRRIG 70ML (MISCELLANEOUS)
SYRINGE TOOMEY IRRIG 70ML (MISCELLANEOUS) IMPLANT
TUBING CONNECTING 10 (TUBING) ×3 IMPLANT
TUBING UROLOGY SET (TUBING) ×3 IMPLANT
WIRE COONS/BENSON .038X145CM (WIRE) IMPLANT

## 2019-05-20 NOTE — Interval H&P Note (Signed)
History and Physical Interval Note:  05/20/2019 12:09 PM  Harold [name]  has presented today for surgery, with the diagnosis of BLADDER STONE LEFT URETERAL STONE.  The various methods of treatment have been discussed with the patient and family. After consideration of risks, benefits and other options for treatment, the patient has consented to  Procedure(s): CYSTOSCOPY WITH LITHOLAPAXY (N/A) CYSTOSCOPY/URETEROSCOPY/HOLMIUM LASER/STENT PLACEMENT (Left) as a surgical intervention.  The patient's history has been reviewed, patient examined, no change in status, stable for surgery.  I have reviewed the patient's chart and labs.  Questions were answered to the patient's satisfaction.     Ardis Hughs

## 2019-05-20 NOTE — H&P (Signed)
85 year old man with a past medical history significant for Follilcularlymphoma, diagnosed in summer 2019, who is referred today for further evaluation of a possible bladder mass which was seen on his CT scan from February 18, 2019. In addition to this finding the patient also is noted to have 2 bladder stones measuring less than 10 mm each as well as a left distal ureteral stone which is been present for quite some time with no significant hydroureteronephrosis. He has responded well to his treatments, as apparent on his most recent CT scan.   The patient has a long history of kidney stones, he was last treated for this in [facility] 5 years ago. He is not having any significant flank pain. He does have occasional discomfort in the right groin area, which he attributes to a lymph node that has not been responsive to treatments. He denies any hematuria or dysuria. He denies any history of recurring urinary tract infections.     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   Tobacco Use Assessment Completed: Used Tobacco in last 30 days? Has never drank.  Drinks 2 caffeinated drinks per day. Patient's occupation is/was Retired.    REVIEW OF SYSTEMS:    GU Review Male:   Patient reports frequent urination, hard to postpone urination, get up at night to urinate, and erection problems. Patient denies burning/ pain with urination, leakage of urine, stream starts and stops, trouble starting your stream, have to strain to urinate , and penile pain.  Gastrointestinal (Upper):   Patient denies nausea, vomiting, and indigestion/ heartburn.  Gastrointestinal (Lower):   Patient reports constipation. Patient denies diarrhea.  Constitutional:   Patient denies fever, night sweats, weight loss, and fatigue.  Skin:   Patient denies skin rash/ lesion and itching.  Eyes:   Patient denies blurred vision and double vision.  Ears/ Nose/ Throat:   Patient denies sore throat and sinus problems.  Hematologic/Lymphatic:   Patient denies swollen glands and easy bruising.  Cardiovascular:   Patient reports leg swelling. Patient denies chest pains.  Respiratory:   Patient denies cough and shortness of breath.  Endocrine:   Patient denies excessive thirst.  Musculoskeletal:   Patient reports back pain and joint pain.   Neurological:   Patient denies headaches and dizziness.  Psychologic:   Patient denies anxiety and depression.   VITAL SIGNS:      04/06/2019 12:43 PM  Weight 172 lb / 78.02 kg  Height 70 in / 177.8 cm  BP 150/72 mmHg  Pulse  80 /min  Temperature 98.4 F / 36.8 C  BMI 24.7 kg/m   GU PHYSICAL EXAMINATION:    Anus and Perineum: No hemorrhoids. No anal stenosis. No rectal fissure, no anal fissure. No edema, no dimple, no perineal tenderness, no anal tenderness.  Scrotum: No lesions. No edema. No cysts. No warts.  Epididymides: Right: no spermatocele, no masses, no cysts, no tenderness, no induration, no enlargement. Left: no  spermatocele, no masses, no cysts, no tenderness, no induration, no enlargement.  Testes: No tenderness, no swelling, no enlargement left testes. No tenderness, no swelling, no enlargement right testes. Normal location left testes. Normal location right testes. No mass, no cyst, no varicocele, no hydrocele left testes. No mass, no cyst, no varicocele, no hydrocele right testes.  Urethral Meatus: Normal size. No lesion, no wart, no discharge, no polyp. Normal location.  Penis: Circumcised, no warts, no cracks. No dorsal Peyronie's plaques, no left corporal Peyronie's plaques, no right corporal Peyronie's plaques, no scarring, no warts. No balanitis, no meatal stenosis.  Prostate: 40 gram or 2+ size. Left lobe normal consistency, right lobe normal consistency. Symmetrical lobes. No prostate nodule. Left lobe no tenderness, right lobe no tenderness.  Seminal Vesicles: Nonpalpable.  Sphincter Tone: Normal sphincter. No rectal tenderness. No rectal mass.    MULTI-SYSTEM PHYSICAL EXAMINATION:    Constitutional: Well-nourished. No physical deformities. Normally developed. Good grooming.  Neck: Neck symmetrical, not swollen. Normal tracheal position.  Respiratory: No labored breathing, no use of accessory muscles.   Cardiovascular: Normal temperature, normal extremity pulses, no swelling, no varicosities.  Lymphatic: No enlargement of neck, axillae, groin.  Skin: No paleness, no jaundice, no cyanosis. No lesion, no ulcer, no rash.  Neurologic / Psychiatric: Oriented to time, oriented to place, oriented to person. No depression, no anxiety, no agitation.  Gastrointestinal: No mass, no tenderness, no rigidity, non obese abdomen.  Eyes: Normal conjunctivae. Normal eyelids.  Ears, Nose, Mouth, and Throat: Left ear no scars, no lesions, no masses. Right ear no scars, no lesions, no masses. Nose no scars, no lesions, no masses. Normal hearing. Normal lips.  Musculoskeletal: Normal gait and station of head and  neck.     PAST DATA REVIEWED:  Source Of History:  Patient   PROCEDURES:         Flexible Cystoscopy - 52000  Risks, benefits, and some of the potential complications of the procedure were discussed at length with the patient including infection, bleeding, voiding discomfort, urinary retention, fever, chills, sepsis, and others. All questions were answered. Informed consent was obtained. Sterile technique and intraurethral analgesia were used.  Meatus:  Normal size. Normal location. Normal condition.  Urethra:  No strictures.  External Sphincter:  Normal.  Verumontanum:  Normal.  Prostate:  Obstructing. Enlarged median lobe. No hyperplasia.  Bladder Neck:  Non-obstructing.  Ureteral Orifices:  Normal location. Normal size. Normal shape. Effluxed clear urine.  Bladder:  Patient has to stones approximately 1 cm each as well as a numerous smaller stones. His bladder is heavily trabeculated. There are no tumors within his bladder.      The lower urinary tract was carefully examined. The procedure was well-tolerated and without complications. Antibiotic instructions were given. Instructions were given to call the office immediately for bloody urine, difficulty urinating, urinary retention, painful or frequent urination, fever, chills, nausea, vomiting or other illness. The patient stated that he understood these instructions and would comply with them.         Urinalysis Dipstick Dipstick Cont'd  Color: Yellow  Bilirubin: Neg mg/dL  Appearance: Clear Ketones: Trace mg/dL  Specific Gravity: 1.025 Blood: Neg ery/uL  pH: <=5.0 Protein: Neg mg/dL  Glucose: Neg mg/dL Urobilinogen: 0.2 mg/dL    Nitrites: Neg    Leukocyte Esterase: Neg leu/uL    ASSESSMENT:      ICD-10 Details  1 GU:   Ureteral calculus - N20.1   2   Bladder Stone - N21.0    PLAN:           Orders Labs Urine Culture          Document Letter(s):  Created for Patient: Clinical Summary         Notes:   The the patient  has 2 small bladder stones and some other small sediment/stones within his bladder. There is no tumor. He also has a stone in the left distal ureter which is been there for quite some time. Fortunately has no hydronephrosis.   I recommended removal of the stone in his ureter as well as the stones within his bladder under general anesthesia. I went through the surgery with him in detail including the risks and the benefits. We will plan to perform cystoscopy with bladder stone fragmentation and removal followed by ureteroscopy and stone basketing. The patient seems to have plenty of appointments within the next 4 weeks and would like to wait till afterwards. This would be totally reasonable since he is relatively asymptomatic.   Will reach out to his primary care doctor to ensure that there is no contraindications to performing this procedure. We will then get him scheduled shortly thereafter.

## 2019-05-20 NOTE — Anesthesia Procedure Notes (Signed)
Procedure Name: LMA Insertion Date/Time: 05/20/2019 12:26 PM Performed by: Glory Buff, CRNA Pre-anesthesia Checklist: Patient identified, Emergency Drugs available, Suction available and Patient being monitored Patient Re-evaluated:Patient Re-evaluated prior to induction Oxygen Delivery Method: Circle system utilized Preoxygenation: Pre-oxygenation with 100% oxygen Induction Type: IV induction LMA: LMA inserted LMA Size: 4.0 Number of attempts: 1 Placement Confirmation: positive ETCO2 Tube secured with: Tape Dental Injury: Teeth and Oropharynx as per pre-operative assessment

## 2019-05-20 NOTE — Transfer of Care (Signed)
Immediate Anesthesia Transfer of Care Note  Patient: Harold [name]  Procedure(s) Performed: CYSTOSCOPY WITH LITHOLAPAXY (N/A ) CYSTOSCOPY/URETEROSCOPY/HOLMIUM LASER/STENT PLACEMENT (Left )  Patient Location: PACU  Anesthesia Type:General  Level of Consciousness: drowsy, patient cooperative and responds to stimulation  Airway & Oxygen Therapy: Patient Spontanous Breathing and Patient connected to face mask oxygen  Post-op Assessment: Report given to RN and Post -op Vital signs reviewed and stable  Post vital signs: Reviewed and stable  Last Vitals:  Vitals Value Taken Time  BP 133/73 05/20/19 1330  Temp 36.7 C 05/20/19 1330  Pulse 61 05/20/19 1333  Resp 9 05/20/19 1333  SpO2 100 % 05/20/19 1333  Vitals shown include unvalidated device data.  Last Pain:  Vitals:   05/20/19 1330  TempSrc:   PainSc: 0-No pain         Complications: No apparent anesthesia complications

## 2019-05-20 NOTE — Anesthesia Preprocedure Evaluation (Addendum)
Anesthesia Evaluation  Patient identified by MRN, date of birth, ID band Patient awake    Reviewed: Allergy & Precautions, NPO status , Patient's Chart, lab work & pertinent test results  Airway Mallampati: II  TM Distance: >3 FB Neck ROM: Full    Dental no notable dental hx.    Pulmonary neg pulmonary ROS,    Pulmonary exam normal breath sounds clear to auscultation       Cardiovascular hypertension, Pt. on medications + CAD and + Cardiac Stents  Normal cardiovascular exam Rhythm:Regular Rate:Normal     Neuro/Psych negative neurological ROS  negative psych ROS   GI/Hepatic negative GI ROS, Neg liver ROS,   Endo/Other  negative endocrine ROS  Renal/GU negative Renal ROS  negative genitourinary   Musculoskeletal  (+) Arthritis , Osteoarthritis,    Abdominal   Peds negative pediatric ROS (+)  Hematology negative hematology ROS (+)   Anesthesia Other Findings   Reproductive/Obstetrics negative OB ROS                             Anesthesia Physical Anesthesia Plan  ASA: III  Anesthesia Plan: General   Post-op Pain Management:    Induction: Intravenous  PONV Risk Score and Plan: 2 and Ondansetron, Midazolam and Treatment may vary due to age or medical condition  Airway Management Planned: LMA  Additional Equipment:   Intra-op Plan:   Post-operative Plan: Extubation in OR  Informed Consent: I have reviewed the patients History and Physical, chart, labs and discussed the procedure including the risks, benefits and alternatives for the proposed anesthesia with the patient or authorized representative who has indicated his/her understanding and acceptance.     Dental advisory given  Plan Discussed with: CRNA  Anesthesia Plan Comments:         Anesthesia Quick Evaluation

## 2019-05-20 NOTE — Op Note (Signed)
Preoperative diagnosis:  1. Bladder stones, 1 cm each, x2 2. Left distal ureteral stone  Postoperative diagnosis:  1. Same  Procedure: 1. Cystoscopy, cystolitholapaxy 2 cm 2. Left retrograde pyelogram with interpretation 3. Left ureteroscopy, laser lithotripsy, stone extraction 4. Left ureteral stent placement  Surgeon: Ardis Hughs, MD  Anesthesia: General  Complications: None  Intraoperative findings:  #1: The patient had an obstructing prostate with a large median lobe.  There are 2 bladder stones are approximately 1 cm in size, these were notable jack stones.  They were lasered and removed. #2: The patient's left retrograde pyelogram demonstrated a filling defect right at the ureteral orifice/intramural ureter.  There was mild tortuosity but no significant hydroureteronephrosis or other additional filling defects. #3: The patient's distal ureteral stone was fragmented and removed and a 26x6 [place] ureteral stent was left in the left ureter.  EBL: Minimal  Specimens: Bladder stones and left distal ureteral stone, these will be sent to [facility] for stone composition analysis  Indication: Harold [name] is a 85 y.o. patient with CT scan findings consistent with 2 large bladder stones as well as a left distal ureteral stone.  After reviewing the management options for treatment, he elected to proceed with the above surgical procedure(s). We have discussed the potential benefits and risks of the procedure, side effects of the proposed treatment, the likelihood of the patient achieving the goals of the procedure, and any potential problems that might occur during the procedure or recuperation. Informed consent has been obtained.  Description of procedure:  The patient was taken to the operating room and general anesthesia was induced.  The patient was placed in the dorsal lithotomy position, prepped and draped in the usual sterile fashion, and preoperative  antibiotics were administered. A preoperative time-out was performed.   A 21 French 30 degrees cystoscope was gently passed through the patient's urethra into the bladder and physical guidance.  Cystoscopy was performed with the above findings.  I then used a 365 nm fiber with settings of 50 Hz and 0.5 J and fragmented the stones into small pieces easily passed through the cystoscope.  Once the bladder stones were fully removed I used a 5 [place] open-ended ureteral catheter and performed retrograde pyelogram with the above findings.  I then advanced a wire up through the open-ended catheter and removing the catheter over the wire.  Then emptied the bladder and removed cystoscope.  I then used a semirigid ureteroscope and cannulate the patient's left distal ureter and was able to fragment the stone using the same laser fiber.  I removed the fragments with a engage basket.  All stone fragments had been completely removed by the timeout was done.  I then repassed the cystoscope and exchanged a wire for an open-ended catheter again and put some contrast into the patient's collecting system.  I then repassed the wire and over the wire past the 26 cm time 6 French double-J ureteral stent in the patient's left ureter.  Under fluoroscopic guidance it was noted that it was well positioned within the patient's renal pelvis as well as in the bladder.  I brought the string tether from the stent out to the patient's urethra and coated up and taped it on to the patient's penile shaft on the dorsum aspect.  The patient was subsequently extubated return the PACU in stable condition.  Disposition: The patient will be instructed to remove the stent on Monday, March 9.  Ardis Hughs, M.D.

## 2019-05-20 NOTE — Anesthesia Postprocedure Evaluation (Signed)
Anesthesia Post Note  Patient: Ashtyn Frasier Gaughan  Procedure(s) Performed: CYSTOSCOPY WITH LITHOLAPAXY (N/A ) CYSTOSCOPY/URETEROSCOPY/HOLMIUM LASER/STENT PLACEMENT (Left )     Patient location during evaluation: PACU Anesthesia Type: General Level of consciousness: awake and alert Pain management: pain level controlled Vital Signs Assessment: post-procedure vital signs reviewed and stable Respiratory status: spontaneous breathing, nonlabored ventilation and respiratory function stable Cardiovascular status: blood pressure returned to baseline and stable Postop Assessment: no apparent nausea or vomiting Anesthetic complications: no    Last Vitals:  Vitals:   05/20/19 1413 05/20/19 1445  BP: (!) 164/71 (!) 154/68  Pulse: 79 71  Resp: 14 14  Temp:    SpO2: 100% 100%    Last Pain:  Vitals:   05/20/19 1445  TempSrc:   PainSc: 0-No pain                 Lynda Rainwater

## 2019-05-20 NOTE — Discharge Instructions (Signed)
DISCHARGE INSTRUCTIONS FOR KIDNEY STONE/URETERAL STENT   MEDICATIONS:  1.  Resume all your other meds from home - except do not take any extra narcotic pain meds that you may have at home.  2. Pyridium is to help with the burning/stinging when you urinate. 3. Tramadol is for moderate/severe pain, otherwise taking upto 1000 mg every 6 hours of plainTylenol will help treat your pain.   4. Take Cipro one hour prior to removal of your stent.   ACTIVITY:  1. No strenuous activity x 1week  2. No driving while on narcotic pain medications  3. Drink plenty of water  4. Continue to walk at home - you can still get blood clots when you are at home, so keep active, but don't over do it.  5. May return to work/school tomorrow or when you feel ready   BATHING:  1. You can shower and we recommend daily showers  2. You have a string coming from your urethra: The stent string is attached to your ureteral stent. Do not pull on this.   SIGNS/SYMPTOMS TO CALL:  Please call us if you have a fever greater than 101.5, uncontrolled nausea/vomiting, uncontrolled pain, dizziness, unable to urinate, bloody urine, chest pain, shortness of breath, leg swelling, leg pain, redness around wound, drainage from wound, or any other concerns or questions.   You can reach [place] at [phone].   FOLLOW-UP:  1. You have an appointment in 6 weeks with a ultrasound of your kidneys prior.  2. You have a string attached to your stent, you may remove it on March 8. To do this, pull the strings until the stents are completely removed. You may feel an odd sensation in your back.

## 2019-07-01 NOTE — Progress Notes (Signed)
HEMATOLOGY/ONCOLOGY CLINIC NOTE  Date of Service: 07/02/2019  Patient Care Team: [name], MD as PCP - General (Family Medicine) [name], MD as PCP - Cardiology (Cardiology)  CHIEF COMPLAINTS/PURPOSE OF CONSULTATION:  mx of low grade Follicular Lymphoma  HISTORY OF PRESENTING ILLNESS:   Harold [name] is a wonderful 85 y.o. male who has been referred to [place] by Dr [name] for evaluation and management of Follicular Lymphoma. He is accompanied today by his sister. The pt reports that he is doing well overall.   The pt reports first noticing the rt neck mass develop in his neck about a year ago and saw his PCP Dr [name].  The mass was getting larger and so he was referred to Dr [name] for further evaluation and had a biopsy of the rt upper neck mass which showed Grade 1-2 follicular lymphoma. pt has had Tissue flow cytometry completed on 08/05/17 with results revealing A LAMBDA-RESTRICTED MONOCLONAL B-CELL POPULATION EXPRESSING CD10 COMPRISES 39% OF ALL LYMPHOCYTES.  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 He denies feeling any differently now than he did a year ago. He takes daily 81mg  aspirin which he notes has led to some mild increased bruising.   Of note prior to the patient's visit today,  He notes that he has had a couple spots of melanoma on his chin and back about 5 and 8 years ago. These were subsequently removed. He has had a basal cell as well and has been treated several times with liquid nitrogen.   On review of systems, pt reports stable energy levels, some bruising, stable bowel habits, and denies fevers, chills, night sweats, unexpected weight loss, pain along the spine, abdominal pains, testicular pain or swelling, and any other symptoms.   On PMHx the pt reports melanoma, CAD,  HTN, HLD, cataract in left eye.   INTERVAL HISTORY:   TALEB CARMICAL returns today for management and evaluation of his follicular lymphoma. He is also here for a toxicity check. The patient's last visit with [place] was on 05/04/18. The pt reports that he is doing well overall.  The pt reports he is fine. Pt has been in urology and had stones removed from bladder. He felt that his right leg was burning and hurting prior to the stones being removed. His ankles have been better, still swelling a little bit. He has been up and working in his yard. Pt has had both doses of COVID19 vaccine.   Of the last Pt had a Renal US [id number]) completed on 06/28/19 with results revealing New right-sided mass at the hilum which measures 1.5cm in size. There is no flow on today's image.   Lab results today (07/02/19) of CBC w/diff and CMP is as follows: all values are WNL except for hemoglobin at 12.5, BUN at 26 07/02/19 of LDH at 177: WNL 07/02/19 of Uric Acid at 5.3: WNL  On review of systems, pt reports healthy appetite and denies fever, chills, night sweats, new lumps/bumps, abdominal pain, blood in urine, change in color of urine and any other symptoms.   MEDICAL HISTORY:  Past Medical History:  Diagnosis Date  . BPH (benign prostatic hypertrophy)   . CAD (coronary artery disease) 01/2010   S/P drug eluting stents LAD and PL branch of RCA Dr. Olevia Perches  . follicular lymphoma  dx'd 06/2017  . Hernia   . HLD (hyperlipidemia)   . HTN (hypertension)     SURGICAL HISTORY: Past Surgical History:  Procedure Laterality Date  . BACK SURGERY    . CYSTOSCOPY WITH LITHOLAPAXY N/A 05/20/2019   Procedure: CYSTOSCOPY WITH LITHOLAPAXY;  Surgeon: Ardis Hughs, MD;  Location: [facility];  Service: Urology;  Laterality: N/A;  . CYSTOSCOPY/URETEROSCOPY/HOLMIUM LASER/STENT PLACEMENT Left 05/20/2019   Procedure: CYSTOSCOPY/URETEROSCOPY/HOLMIUM LASER/STENT PLACEMENT;  Surgeon: Ardis Hughs, MD;  Location: [facility];   Service: Urology;  Laterality: Left;  . HERNIA REPAIR  01/08/11   RIH  . MASS EXCISION Right 08/05/2017   Procedure: EXCISION RIGHT POSTERIOR  NECK LYMPH NODE;  Surgeon: Rozetta Nunnery, MD;  Location: [facility];  Service: ENT;  Laterality: Right;    SOCIAL HISTORY: Social History   Socioeconomic History  . Marital status: Married    Spouse name: Not on file  . Number of children: Not on file  . Years of education: Not on file  . Highest education level: Not on file  Occupational History  . Not on file  Tobacco Use  . Smoking status: Never Smoker  . Smokeless tobacco: Never Used  Substance and Sexual Activity  . Alcohol use: No  . Drug use: No  . Sexual activity: Not on file  Other Topics Concern  . Not on file  Social History Narrative   Married   Social Determinants of Health   Financial Resource Strain:   . Difficulty of Paying Living Expenses:   Food Insecurity:   . Worried About Charity fundraiser in the Last Year:   . Arboriculturist in the Last Year:   Transportation Needs:   . Film/video editor (Medical):   [name] Lack of Transportation (Non-Medical):   Physical Activity:   . Days of Exercise per Week:   . Minutes of Exercise per Session:   Stress:   . Feeling of Stress :   Social Connections:   . Frequency of Communication with Friends and Family:   . Frequency of Social Gatherings with Friends and Family:   . Attends Religious Services:   . Active Member of Clubs or Organizations:   . Attends Archivist Meetings:   [name] Marital Status:   Intimate Partner Violence:   . Fear of Current or Ex-Partner:   . Emotionally Abused:   [name] Physically Abused:   . Sexually Abused:     FAMILY HISTORY: Family History  Problem Relation Age of Onset  . Heart failure Mother     ALLERGIES:  has No Known Allergies.  MEDICATIONS:  Current Outpatient Medications  Medication Sig Dispense Refill  . aspirin 81 MG tablet Take 81 mg by  mouth daily.      [name] atenolol (TENORMIN) 25 MG tablet Take 1 tablet (25 mg total) by mouth daily. 90 tablet 3  . brimonidine (ALPHAGAN) 0.2 % ophthalmic solution Place 1 drop into the right eye 2 (two) times daily.      . cholecalciferol (VITAMIN D) 1000 UNITS tablet Take 1,000 Units by mouth daily.      . Cyanocobalamin (B-12) 2500 MCG TABS Take 2,500 mcg by mouth daily.    . finasteride (PROSCAR) 5 MG tablet Take 5 mg by mouth daily.      . furosemide (LASIX) 20 MG tablet Take 1 tablet (20 mg total) by mouth daily. 90 tablet 3  . gabapentin (NEURONTIN) 100 MG capsule Take 100 mg by  mouth daily as needed (knee pain).     . isosorbide dinitrate (ISORDIL) 5 MG tablet Take 1 tablet (5 mg total) by mouth 2 (two) times daily. 180 tablet 3  . lisinopril (PRINIVIL,ZESTRIL) 10 MG tablet Take 1 tablet (10 mg total) by mouth daily. 90 tablet 3  . pravastatin (PRAVACHOL) 40 MG tablet Take 1 tablet (40 mg total) by mouth at bedtime. (Patient taking differently: Take 40 mg by mouth every Monday, Wednesday, and Friday. ) 90 tablet 3  . traMADol (ULTRAM) 50 MG tablet Take 1-2 tablets (50-100 mg total) by mouth every 6 (six) hours as needed for moderate pain. 15 tablet 0  . allopurinol (ZYLOPRIM) 100 MG tablet Take 1 tablet (100 mg total) by mouth 2 (two) times daily. (Patient not taking: Reported on 05/12/2019) 30 tablet 0  . nitroGLYCERIN (NITROSTAT) 0.4 MG SL tablet Place 1 tablet (0.4 mg total) under the tongue every 5 (five) minutes as needed for chest pain. 25 tablet 6  . ondansetron (ZOFRAN) 8 MG tablet Take 1 tablet (8 mg total) by mouth every 8 (eight) hours as needed for nausea. (Patient not taking: Reported on 05/12/2019) 30 tablet 0  . phenazopyridine (PYRIDIUM) 200 MG tablet Take 1 tablet (200 mg total) by mouth 3 (three) times daily as needed for pain. (Patient not taking: Reported on 07/02/2019) 10 tablet 0  . timolol (TIMOPTIC) 0.5 % ophthalmic solution Place 1 drop into the right eye daily.       No  current facility-administered medications for this visit.    REVIEW OF SYSTEMS:   A 10+ POINT REVIEW OF SYSTEMS WAS OBTAINED including neurology, dermatology, psychiatry, cardiac, respiratory, lymph, extremities, GI, GU, Musculoskeletal, constitutional, breasts, reproductive, HEENT.  All pertinent positives are noted in the HPI.  All others are negative.   PHYSICAL EXAMINATION: ECOG FS:2 - Symptomatic, <50% confined to bed  Exam performed in a chair.  Vitals:   07/02/19 1019  BP: 125/63  Pulse: 65  Resp: 18  Temp: 97.8 F (36.6 C)  SpO2: 100%   Wt Readings from Last 3 Encounters:  07/02/19 166 lb 9.6 oz (75.6 kg)  05/20/19 172 lb (78 kg)  05/17/19 172 lb (78 kg)   Body mass index is 24.6 kg/m.    Exam given in chair   GENERAL:alert, in no acute distress and comfortable SKIN: no acute rashes, no significant lesions EYES: conjunctiva are pink and non-injected, sclera anicteric OROPHARYNX: MMM, no exudates, no oropharyngeal erythema or ulceration NECK: supple, no JVD LYMPH:  no palpable lymphadenopathy in the cervical, axillary or inguinal regions LUNGS: clear to auscultation b/l with normal respiratory effort HEART: regular rate & rhythm ABDOMEN:  normoactive bowel sounds , non tender, not distended. Extremity: no pedal edema PSYCH: alert & oriented x 3 with fluent speech NEURO: no focal motor/sensory deficits  LABORATORY DATA:  I have reviewed the data as listed  . CBC Latest Ref Rng & Units 07/02/2019 05/17/2019 02/18/2019  WBC 4.0 - 10.5 K/uL 6.2 6.0 6.8  Hemoglobin 13.0 - 17.0 g/dL 12.5(L) 11.9(L) 12.3(L)  Hematocrit 39.0 - 52.0 % 39.9 36.9(L) 38.7(L)  Platelets 150 - 400 K/uL 207 232 243    . CMP Latest Ref Rng & Units 07/02/2019 05/17/2019 02/18/2019  Glucose 70 - 99 mg/dL 99 97 85  BUN 8 - 23 mg/dL 26(H) 21 23  Creatinine 0.61 - 1.24 mg/dL 0.93 0.95 0.87  Sodium 135 - 145 mmol/L 142 140 142  Potassium 3.5 - 5.1 mmol/L 4.2 4.0 4.0  Chloride 98 - 111 mmol/L 107  106 105  CO2 22 - 32 mmol/L 26 29 28   Calcium 8.9 - 10.3 mg/dL 9.2 9.4 9.4  Total Protein 6.5 - 8.1 g/dL 7.0 - 6.7  Total Bilirubin 0.3 - 1.2 mg/dL 0.9 - 0.6  Alkaline Phos 38 - 126 U/L 70 - 60  AST 15 - 41 U/L 15 - 13(L)  ALT 0 - 44 U/L 10 - 9  uric acid 6.4  . Lab Results  Component Value Date   LDH 177 07/02/2019     02/18/2019 CT Abdomen Pelvis W Contrast (Accession [id number]):   02/18/2019 CT Chest W Contrast (Accession [id number]):  08/08/17 Pathology:   08/05/17 Pathology:   RADIOGRAPHIC STUDIES: I have personally reviewed the radiological images as listed and agreed with the findings in the report. No results found.  ASSESSMENT & PLAN:   85 y.o. male with  1.h/o  atleast Stage III Follicular Lymphoma Grade 1-2 presenting with rt cervical lymphadenopathy  08/05/17 pathology results of right cervical LN revealing low grade follicular lymphoma  [id number] PET revealed Hypermetabolic lymphadenopathy involving the right neck, bilateral axillary, right hilar and right inguinal lymph nodes. No abdominal or pelvic involvement.    03/20/18 CT C/A/P revealed Progressive bilateral axillary and right inguinal lymphadenopathy, including necrotic lymph nodes in the right axilla, as above. Spleen is normal in size. 4 mm distal left ureteral calculus, unchanged. No hydronephrosis. Stable layering bladder calculi measuring up to 5 mm. Cholelithiasis, without associated inflammatory changes. Stable sequela of prior/chronic pancreatitis.   11/03/2018 CT neck revealed "Marked interval progression of cervical lymphadenopathy since PET-CT 09/02/2017, now with lymphadenopathy present bilaterally at essentially all stations. Index nodes as described. This includes bilateral intraparotid lymphadenopathy. Bulky right-sided adenopathy results in multifocal narrowing of the right internal jugular vein. Significant narrowing of the mid to lower left internal jugular vein of unclear cause as there is  no significant mass effect at this level."  11/03/2018 CT CAP revealed "1. Progression of bilateral axillary, retroperitoneal, pelvic, and inguinal adenopathy. New anterior and posterior lower cervical adenopathy identified within the neck. 2. Persistent distal left ureteral calculus. Small bladder calculi are unchanged. 3. Aortic Atherosclerosis (ICD10-I70.0). Coronary artery calcifications. 4. Sequelae of chronic pancreatitis noted."  2. 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 3. H/o head and neck Basal cell carcinoma Continue routine skin checks with dermatologist    4. Renal indeterminate lesion  5. Urinary stones in ureter and bladder. PLAN: -Discussed pt labwork today, 07/02/19; of CBC w/diff and CMP is as follows: all values are WNL except for hemoglobin at 12.5, BUN at 26 -Discussed 07/02/19 of LDH at 177: WNL -Discussed 07/02/19 of Uric Acid at 5.3: WNL -Discussed 06/28/19 Renal US [phone]) New right-sided mass at the hilum which measures 1.5cm in size.  -Advised on spot on kidney  -Advised that he has responded well to treatment with Rituxan and currently has no evidence of lymphoma progression.  -Recommended elevating legs and using compression socks for leg swelling. Also advised to watch sodium intake.  -Will get repeat CT abd/pelvis 3 months  -Will see back in 4 months   FOLLOW UP: CT abd/pelvis in 3 months RTC with Dr Irene Limbo with labs in 4 months  The total time spent in the appt was 20 minutes and more than 50% was on counseling and direct patient cares.  All of the patient's questions were answered with apparent satisfaction. The patient knows to call the clinic with any problems, questions or concerns.  Sullivan Lone MD [place] [facility] Hematology/Oncology Physician [facility]  (Office):       [phone] (Work cell):  [phone] (Fax):           [phone]  07/02/2019 11:29 AM  I, Dawayne Cirri am acting as a scribe for Dr. Sullivan Lone.    .I have reviewed the above documentation for accuracy and completeness, and I agree with the above.  Brunetta Genera MD

## 2019-07-02 ENCOUNTER — Inpatient Hospital Stay: Payer: Medicare Other | Admitting: Hematology

## 2019-07-02 ENCOUNTER — Inpatient Hospital Stay: Payer: Medicare Other | Attending: Hematology

## 2019-07-02 ENCOUNTER — Telehealth: Payer: Self-pay | Admitting: Hematology

## 2019-07-02 ENCOUNTER — Other Ambulatory Visit: Payer: Self-pay

## 2019-07-02 VITALS — BP 125/63 | HR 65 | Temp 97.8°F | Resp 18 | Ht 69.0 in | Wt 166.6 lb

## 2019-07-02 DIAGNOSIS — N289 Disorder of kidney and ureter, unspecified: Secondary | ICD-10-CM

## 2019-07-02 DIAGNOSIS — C8208 Follicular lymphoma grade I, lymph nodes of multiple sites: Secondary | ICD-10-CM | POA: Diagnosis not present

## 2019-07-02 DIAGNOSIS — N2889 Other specified disorders of kidney and ureter: Secondary | ICD-10-CM | POA: Diagnosis not present

## 2019-07-02 DIAGNOSIS — Z79899 Other long term (current) drug therapy: Secondary | ICD-10-CM | POA: Diagnosis not present

## 2019-07-02 DIAGNOSIS — Z85828 Personal history of other malignant neoplasm of skin: Secondary | ICD-10-CM | POA: Insufficient documentation

## 2019-07-02 DIAGNOSIS — Z7982 Long term (current) use of aspirin: Secondary | ICD-10-CM | POA: Insufficient documentation

## 2019-07-02 DIAGNOSIS — N400 Enlarged prostate without lower urinary tract symptoms: Secondary | ICD-10-CM | POA: Diagnosis not present

## 2019-07-02 DIAGNOSIS — C8298 Follicular lymphoma, unspecified, lymph nodes of multiple sites: Secondary | ICD-10-CM | POA: Diagnosis not present

## 2019-07-02 DIAGNOSIS — I10 Essential (primary) hypertension: Secondary | ICD-10-CM | POA: Insufficient documentation

## 2019-07-02 DIAGNOSIS — Z85820 Personal history of malignant melanoma of skin: Secondary | ICD-10-CM | POA: Diagnosis not present

## 2019-07-02 LAB — CMP (CANCER CENTER ONLY)
ALT: 10 U/L (ref 0–44)
AST: 15 U/L (ref 15–41)
Albumin: 4.1 g/dL (ref 3.5–5.0)
Alkaline Phosphatase: 70 U/L (ref 38–126)
Anion gap: 9 (ref 5–15)
BUN: 26 mg/dL — ABNORMAL HIGH (ref 8–23)
CO2: 26 mmol/L (ref 22–32)
Calcium: 9.2 mg/dL (ref 8.9–10.3)
Chloride: 107 mmol/L (ref 98–111)
Creatinine: 0.93 mg/dL (ref 0.61–1.24)
GFR, Est AFR Am: >60 mL/min (ref >60)
GFR, Estimated: >60 mL/min (ref >60)
Glucose, Bld: 99 mg/dL (ref 70–99)
Potassium: 4.2 mmol/L (ref 3.5–5.1)
Sodium: 142 mmol/L (ref 135–145)
Total Bilirubin: 0.9 mg/dL (ref 0.3–1.2)
Total Protein: 7 g/dL (ref 6.5–8.1)

## 2019-07-02 LAB — CBC WITH DIFFERENTIAL/PLATELET
Abs Immature Granulocytes: 0.02 10*3/uL (ref 0.00–0.07)
Basophils Absolute: 0 10*3/uL (ref 0.0–0.1)
Basophils Relative: 0 %
Eosinophils Absolute: 0.1 10*3/uL (ref 0.0–0.5)
Eosinophils Relative: 1 %
HCT: 39.9 % (ref 39.0–52.0)
Hemoglobin: 12.5 g/dL — ABNORMAL LOW (ref 13.0–17.0)
Immature Granulocytes: 0 %
Lymphocytes Relative: 18 %
Lymphs Abs: 1.1 10*3/uL (ref 0.7–4.0)
MCH: 28 pg (ref 26.0–34.0)
MCHC: 31.3 g/dL (ref 30.0–36.0)
MCV: 89.5 fL (ref 80.0–100.0)
Monocytes Absolute: 0.5 10*3/uL (ref 0.1–1.0)
Monocytes Relative: 9 %
Neutro Abs: 4.5 10*3/uL (ref 1.7–7.7)
Neutrophils Relative %: 72 %
Platelets: 207 10*3/uL (ref 150–400)
RBC: 4.46 MIL/uL (ref 4.22–5.81)
RDW: 13.4 % (ref 11.5–15.5)
WBC: 6.2 10*3/uL (ref 4.0–10.5)
nRBC: 0 % (ref 0.0–0.2)

## 2019-07-02 LAB — URIC ACID: Uric Acid, Serum: 5.3 mg/dL (ref 3.7–8.6)

## 2019-07-02 LAB — LACTATE DEHYDROGENASE: LDH: 177 U/L (ref 98–192)

## 2019-07-02 NOTE — Telephone Encounter (Signed)
Scheduled appt per 4/16 los.  Printed calendar and avs.

## 2019-10-01 ENCOUNTER — Other Ambulatory Visit: Payer: Self-pay

## 2019-10-01 ENCOUNTER — Inpatient Hospital Stay: Payer: Medicare Other | Attending: Hematology

## 2019-10-01 ENCOUNTER — Encounter (HOSPITAL_COMMUNITY): Payer: Self-pay

## 2019-10-01 ENCOUNTER — Ambulatory Visit (HOSPITAL_COMMUNITY)
Admission: RE | Admit: 2019-10-01 | Discharge: 2019-10-01 | Disposition: A | Discharge status: Home / Self Care | Payer: Medicare Other | Source: Ambulatory Visit | Attending: Hematology | Admitting: Hematology

## 2019-10-01 DIAGNOSIS — N2889 Other specified disorders of kidney and ureter: Secondary | ICD-10-CM | POA: Insufficient documentation

## 2019-10-01 DIAGNOSIS — C8208 Follicular lymphoma grade I, lymph nodes of multiple sites: Secondary | ICD-10-CM

## 2019-10-01 DIAGNOSIS — Z5112 Encounter for antineoplastic immunotherapy: Secondary | ICD-10-CM

## 2019-10-01 DIAGNOSIS — C8298 Follicular lymphoma, unspecified, lymph nodes of multiple sites: Secondary | ICD-10-CM | POA: Insufficient documentation

## 2019-10-01 LAB — CBC WITH DIFFERENTIAL/PLATELET
Abs Immature Granulocytes: 0.03 K/uL (ref 0.00–0.07)
Basophils Absolute: 0 K/uL (ref 0.0–0.1)
Basophils Relative: 0 %
Eosinophils Absolute: 0 K/uL (ref 0.0–0.5)
Eosinophils Relative: 0 %
HCT: 36.5 % — ABNORMAL LOW (ref 39.0–52.0)
Hemoglobin: 11.7 g/dL — ABNORMAL LOW (ref 13.0–17.0)
Immature Granulocytes: 0 %
Lymphocytes Relative: 14 %
Lymphs Abs: 1.3 K/uL (ref 0.7–4.0)
MCH: 28.9 pg (ref 26.0–34.0)
MCHC: 32.1 g/dL (ref 30.0–36.0)
MCV: 90.1 fL (ref 80.0–100.0)
Monocytes Absolute: 1 K/uL (ref 0.1–1.0)
Monocytes Relative: 10 %
Neutro Abs: 7.1 K/uL (ref 1.7–7.7)
Neutrophils Relative %: 76 %
Platelets: 211 K/uL (ref 150–400)
RBC: 4.05 MIL/uL — ABNORMAL LOW (ref 4.22–5.81)
RDW: 13.5 % (ref 11.5–15.5)
WBC: 9.5 K/uL (ref 4.0–10.5)
nRBC: 0 % (ref 0.0–0.2)

## 2019-10-01 LAB — CMP (CANCER CENTER ONLY)
ALT: 10 U/L (ref 0–44)
AST: 14 U/L — ABNORMAL LOW (ref 15–41)
Albumin: 3.8 g/dL (ref 3.5–5.0)
Alkaline Phosphatase: 63 U/L (ref 38–126)
Anion gap: 9 (ref 5–15)
BUN: 21 mg/dL (ref 8–23)
CO2: 25 mmol/L (ref 22–32)
Calcium: 9.2 mg/dL (ref 8.9–10.3)
Chloride: 106 mmol/L (ref 98–111)
Creatinine: 0.92 mg/dL (ref 0.61–1.24)
GFR, Est AFR Am: >60 mL/min
GFR, Estimated: >60 mL/min
Glucose, Bld: 98 mg/dL (ref 70–99)
Potassium: 4 mmol/L (ref 3.5–5.1)
Sodium: 140 mmol/L (ref 135–145)
Total Bilirubin: 0.9 mg/dL (ref 0.3–1.2)
Total Protein: 6.6 g/dL (ref 6.5–8.1)

## 2019-10-01 LAB — URIC ACID: Uric Acid, Serum: 5.6 mg/dL (ref 3.7–8.6)

## 2019-10-01 MED ORDER — IOHEXOL 300 MG/ML  SOLN
100.0000 mL | Freq: Once | INTRAMUSCULAR | Status: AC | PRN
  Administered 2019-10-01: 100 mL via INTRAVENOUS

## 2019-10-01 MED ORDER — SODIUM CHLORIDE (PF) 0.9 % IJ SOLN
INTRAMUSCULAR | Status: AC
  Filled 2019-10-01: qty 50

## 2019-10-01 MED ORDER — IOHEXOL 9 MG/ML PO SOLN: 500.0000 mL | ORAL | Status: AC

## 2019-10-01 MED ORDER — IOHEXOL 9 MG/ML PO SOLN
ORAL | Status: AC
  Administered 2019-10-01: 1000 mL
  Filled 2019-10-01: qty 1000

## 2019-10-06 ENCOUNTER — Telehealth: Payer: Self-pay | Admitting: Hematology

## 2019-10-06 NOTE — Telephone Encounter (Signed)
Rescheduled 08/13 appointment to 08/17 per provider pal, patient has been called and notified.

## 2019-10-29 ENCOUNTER — Ambulatory Visit: Payer: Medicare Other | Admitting: Hematology

## 2019-10-29 ENCOUNTER — Other Ambulatory Visit: Payer: Medicare Other

## 2019-11-01 NOTE — Progress Notes (Signed)
HEMATOLOGY/ONCOLOGY CLINIC NOTE  Date of Service: 11/02/2019  Patient Care Team: [name], MD as PCP - General (Family Medicine) [name], MD as PCP - Cardiology (Cardiology)  CHIEF COMPLAINTS/PURPOSE OF CONSULTATION:  mx of low grade Follicular Lymphoma  HISTORY OF PRESENTING ILLNESS:   Harold [name] is a wonderful 85 y.o. male who has been referred to [place] by Dr [name] for evaluation and management of Follicular Lymphoma. He is accompanied today by his sister. The pt reports that he is doing well overall.   The pt reports first noticing the rt neck mass develop in his neck about a year ago and saw his PCP Dr [name].  The mass was getting larger and so he was referred to Dr [name] for further evaluation and had a biopsy of the rt upper neck mass which showed Grade 1-2 follicular lymphoma. pt has had Tissue flow cytometry completed on 08/05/17 with results revealing A LAMBDA-RESTRICTED MONOCLONAL B-CELL POPULATION EXPRESSING CD10 COMPRISES 39% OF ALL LYMPHOCYTES.  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 He denies feeling any differently now than he did a year ago. He takes daily 81mg  aspirin which he notes has led to some mild increased bruising.   Of note prior to the patient's visit today,  He notes that he has had a couple spots of melanoma on his chin and back about 5 and 8 years ago. These were subsequently removed. He has had a basal cell as well and has been treated several times with liquid nitrogen.   On review of systems, pt reports stable energy levels, some bruising, stable bowel habits, and denies fevers, chills, night sweats, unexpected weight loss, pain along the spine, abdominal pains, testicular pain or swelling, and any other symptoms.   On PMHx the pt reports melanoma, CAD,  HTN, HLD, cataract in left eye.   INTERVAL HISTORY:  Harold [name] returns today for management and evaluation of his follicular lymphoma. The patient's last visit with [place] was on 07/02/2019. The pt reports that he is doing well overall.  The pt reports that he has noticed some enlargement of lumps in his underarms and behind his right ear. The bump behind his right ear was sore to the touch for a week. He endorses growth of his right inguinal lymph node. Pt had diarrhea for a few days following his CT scan. He has also noticed increased weakness. Pt has some leg swelling and elevates his legs when possible.   Of note since the patient's last visit, pt has had CT Abd/Pel ([id number]) completed on 10/01/2019 with results revealing "1. There is no evident mass of the posterior midportion of the right kidney to correspond to prior ultrasound finding. Multiphasic contrast CT and MRI are the most sensitive test of choice to evaluate suspected renal masses. 2. New and enlarged abdominal and pelvic lymph nodes at multiple stations, the largest right inguinal lymph node or soft tissue mass measuring 4.1 x 3.1 cm. Findings are consistent with worsened follicular lymphoma, in keeping with patient history."  Lab results today (11/02/19) of CBC w/diff and CMP is as follows: all values are WNL except for Hgb at 12.2, AST at 12. 11/02/2019 LDH at 158  On review of systems, pt reports new lumps/bumps, weakness, leg swelling and denies fevers, chills, night sweats, unexpected weight loss,  loss of appetite, abdominal pain and any other symptoms.   MEDICAL HISTORY:  Past Medical History:  Diagnosis Date  . BPH (benign prostatic hypertrophy)   . CAD (coronary artery disease) 01/2010   S/P drug eluting stents LAD and PL branch of RCA Dr. Olevia Perches  . follicular lymphoma dx'd 06/2017  . Hernia   . HLD (hyperlipidemia)   . HTN (hypertension)     SURGICAL HISTORY: Past Surgical History:  Procedure Laterality Date   . BACK SURGERY    . CYSTOSCOPY WITH LITHOLAPAXY N/A 05/20/2019   Procedure: CYSTOSCOPY WITH LITHOLAPAXY;  Surgeon: Ardis Hughs, MD;  Location: [facility];  Service: Urology;  Laterality: N/A;  . CYSTOSCOPY/URETEROSCOPY/HOLMIUM LASER/STENT PLACEMENT Left 05/20/2019   Procedure: CYSTOSCOPY/URETEROSCOPY/HOLMIUM LASER/STENT PLACEMENT;  Surgeon: Ardis Hughs, MD;  Location: [facility];  Service: Urology;  Laterality: Left;  . HERNIA REPAIR  01/08/11   [facility]  . MASS EXCISION Right 08/05/2017   Procedure: EXCISION RIGHT POSTERIOR  NECK LYMPH NODE;  Surgeon: Rozetta Nunnery, MD;  Location: [place];  Service: ENT;  Laterality: Right;    SOCIAL HISTORY: Social History   Socioeconomic History  . Marital status: Married    Spouse name: Not on file  . Number of children: Not on file  . Years of education: Not on file  . Highest education level: Not on file  Occupational History  . Not on file  Tobacco Use  . Smoking status: Never Smoker  . Smokeless tobacco: Never Used  Vaping Use  . Vaping Use: Never used  Substance and Sexual Activity  . Alcohol use: No  . Drug use: No  . Sexual activity: Not on file  Other Topics Concern  . Not on file  Social History Narrative   Married   Social Determinants of Health   Financial Resource Strain:   . Difficulty of Paying Living Expenses:   Food Insecurity:   . Worried About Charity fundraiser in the Last Year:   . Arboriculturist in the Last Year:   Transportation Needs:   . Film/video editor (Medical):   [name] Lack of Transportation (Non-Medical):   Physical Activity:   . Days of Exercise per Week:   . Minutes of Exercise per Session:   Stress:   . Feeling of Stress :   Social Connections:   . Frequency of Communication with Friends and Family:   . Frequency of Social Gatherings with Friends and Family:   . Attends Religious Services:   . Active Member of Clubs or Organizations:   . Attends Theatre manager Meetings:   [name] Marital Status:   Intimate Partner Violence:   . Fear of Current or Ex-Partner:   . Emotionally Abused:   [name] Physically Abused:   . Sexually Abused:     FAMILY HISTORY: Family History  Problem Relation Age of Onset  . Heart failure Mother     ALLERGIES:  has No Known Allergies.  MEDICATIONS:  Current Outpatient Medications  Medication Sig Dispense Refill  . allopurinol (ZYLOPRIM) 100 MG tablet Take 1 tablet (100 mg total) by mouth 2 (two) times daily. (Patient not taking: Reported on 05/12/2019) 30 tablet 0  . aspirin 81 MG tablet Take 81 mg by mouth daily.      [name] atenolol (TENORMIN) 25 MG tablet Take 1 tablet (25 mg total) by mouth daily. 90 tablet 3  . brimonidine (ALPHAGAN) 0.2 % ophthalmic solution Place 1 drop into the right  eye 2 (two) times daily.      . cholecalciferol (VITAMIN D) 1000 UNITS tablet Take 1,000 Units by mouth daily.      . Cyanocobalamin (B-12) 2500 MCG TABS Take 2,500 mcg by mouth daily.    . finasteride (PROSCAR) 5 MG tablet Take 5 mg by mouth daily.      . furosemide (LASIX) 20 MG tablet Take 1 tablet (20 mg total) by mouth daily. 90 tablet 3  . gabapentin (NEURONTIN) 100 MG capsule Take 100 mg by mouth daily as needed (knee pain).     . isosorbide dinitrate (ISORDIL) 5 MG tablet Take 1 tablet (5 mg total) by mouth 2 (two) times daily. 180 tablet 3  . lisinopril (PRINIVIL,ZESTRIL) 10 MG tablet Take 1 tablet (10 mg total) by mouth daily. 90 tablet 3  . nitroGLYCERIN (NITROSTAT) 0.4 MG SL tablet Place 1 tablet (0.4 mg total) under the tongue every 5 (five) minutes as needed for chest pain. 25 tablet 6  . ondansetron (ZOFRAN) 8 MG tablet Take 1 tablet (8 mg total) by mouth every 8 (eight) hours as needed for nausea. (Patient not taking: Reported on 05/12/2019) 30 tablet 0  . phenazopyridine (PYRIDIUM) 200 MG tablet Take 1 tablet (200 mg total) by mouth 3 (three) times daily as needed for pain. (Patient not taking: Reported on 07/02/2019)  10 tablet 0  . pravastatin (PRAVACHOL) 40 MG tablet Take 1 tablet (40 mg total) by mouth at bedtime. (Patient taking differently: Take 40 mg by mouth every Monday, Wednesday, and Friday. ) 90 tablet 3  . timolol (TIMOPTIC) 0.5 % ophthalmic solution Place 1 drop into the right eye daily.      . traMADol (ULTRAM) 50 MG tablet Take 1-2 tablets (50-100 mg total) by mouth every 6 (six) hours as needed for moderate pain. 15 tablet 0   No current facility-administered medications for this visit.    REVIEW OF SYSTEMS:   A 10+ POINT REVIEW OF SYSTEMS WAS OBTAINED including neurology, dermatology, psychiatry, cardiac, respiratory, lymph, extremities, GI, GU, Musculoskeletal, constitutional, breasts, reproductive, HEENT.  All pertinent positives are noted in the HPI.  All others are negative.   PHYSICAL EXAMINATION: ECOG FS:2 - Symptomatic, <50% confined to bed  Exam performed in a chair.  Vitals:   11/02/19 0948  BP: (!) 122/52  Pulse: 63  Resp: 18  Temp: 97.9 F (36.6 C)  SpO2: 100%   Wt Readings from Last 3 Encounters:  11/02/19 166 lb (75.3 kg)  07/02/19 166 lb 9.6 oz (75.6 kg)  05/20/19 172 lb (78 kg)   Body mass index is 24.51 kg/m.    Exam was given in a chair   GENERAL:alert, in no acute distress and comfortable SKIN: no acute rashes, no significant lesions EYES: conjunctiva are pink and non-injected, sclera anicteric OROPHARYNX: MMM, no exudates, no oropharyngeal erythema or ulceration NECK: supple, no JVD LYMPH:  lymphadenopathy in the left jaw, left axillary, and inguinal regions. 1 cm lymph node behind right ear. LUNGS: clear to auscultation b/l with normal respiratory effort HEART: regular rate & rhythm ABDOMEN:  normoactive bowel sounds , non tender, not distended. No palpable hepatosplenomegaly.  Extremity: 1+ pedal edema b/l PSYCH: alert & oriented x 3 with fluent speech NEURO: no focal motor/sensory deficits  LABORATORY DATA:  I have reviewed the data as  listed  . CBC Latest Ref Rng & Units 11/02/2019 10/01/2019 07/02/2019  WBC 4.0 - 10.5 K/uL 8.3 9.5 6.2  Hemoglobin 13.0 - 17.0 g/dL 12.2(L) 11.7(L)  12.5(L)  Hematocrit 39 - 52 % 39.0 36.5(L) 39.9  Platelets 150 - 400 K/uL 224 211 207    . CMP Latest Ref Rng & Units 11/02/2019 10/01/2019 07/02/2019  Glucose 70 - 99 mg/dL 93 98 99  BUN 8 - 23 mg/dL 20 21 26(H)  Creatinine 0.61 - 1.24 mg/dL 0.99 0.92 0.93  Sodium 135 - 145 mmol/L 142 140 142  Potassium 3.5 - 5.1 mmol/L 3.9 4.0 4.2  Chloride 98 - 111 mmol/L 104 106 107  CO2 22 - 32 mmol/L 28 25 26   Calcium 8.9 - 10.3 mg/dL 10.0 9.2 9.2  Total Protein 6.5 - 8.1 g/dL 6.8 6.6 7.0  Total Bilirubin 0.3 - 1.2 mg/dL 0.8 0.9 0.9  Alkaline Phos 38 - 126 U/L 65 63 70  AST 15 - 41 U/L 12(L) 14(L) 15  ALT 0 - 44 U/L 9 10 10   uric acid 6.4  . Lab Results  Component Value Date   LDH 158 11/02/2019     02/18/2019 CT Abdomen Pelvis W Contrast (Accession [id number]):   02/18/2019 CT Chest W Contrast (Accession [id number]):  08/08/17 Pathology:   08/05/17 Pathology:   RADIOGRAPHIC STUDIES: I have personally reviewed the radiological images as listed and agreed with the findings in the report. No results found.  ASSESSMENT & PLAN:   85 y.o. male with  1.h/o  atleast Stage III Follicular Lymphoma Grade 1-2 presenting with rt cervical lymphadenopathy  08/05/17 pathology results of right cervical LN revealing low grade follicular lymphoma  0/93/23 PET revealed Hypermetabolic lymphadenopathy involving the right neck, bilateral axillary, right hilar and right inguinal lymph nodes. No abdominal or pelvic involvement.    03/20/18 CT C/A/P revealed Progressive bilateral axillary and right inguinal lymphadenopathy, including necrotic lymph nodes in the right axilla, as above. Spleen is normal in size. 4 mm distal left ureteral calculus, unchanged. No hydronephrosis. Stable layering bladder calculi measuring up to 5 mm. Cholelithiasis, without  associated inflammatory changes. Stable sequela of prior/chronic pancreatitis.   11/03/2018 CT neck revealed "Marked interval progression of cervical lymphadenopathy since PET-CT 09/02/2017, now with lymphadenopathy present bilaterally at essentially all stations. Index nodes as described. This includes bilateral intraparotid lymphadenopathy. Bulky right-sided adenopathy results in multifocal narrowing of the right internal jugular vein. Significant narrowing of the mid to lower left internal jugular vein of unclear cause as there is no significant mass effect at this level."  11/03/2018 CT CAP revealed "1. Progression of bilateral axillary, retroperitoneal, pelvic, and inguinal adenopathy. New anterior and posterior lower cervical adenopathy identified within the neck. 2. Persistent distal left ureteral calculus. Small bladder calculi are unchanged. 3. Aortic Atherosclerosis (ICD10-I70.0). Coronary artery calcifications. 4. Sequelae of chronic pancreatitis noted."  2. 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 3. H/o head and neck Basal cell carcinoma Continue routine skin checks with dermatologist    4. Renal indeterminate lesion -06/28/19 Renal US ([id number]) revealed New right-sided mass at the hilum which measures 1.5cm in size.   5. Urinary stones in ureter and bladder.  PLAN: -Discussed pt labwork today, 11/02/19; blood counts and chemistries are nml, LDH is WNL -Discussed 10/01/2019 CT Abd/Pel ([id number]) which revealed no evidence of mass in right kidney, new and enlarged abdominal and pelvic lymph nodes.  -No laboratory evidence of FL progression. Some clinical and radiographic evidence of FL progression.  -Discussed restarting Rituxan to shrink lymph nodes. Advised pt that we would prefer to hold treatment until necessary.  -Pt would prefer not to begin treatment at this time, notes  that his symptoms are not particularly bothersome.  -No indication to begin treatment at this time. Will  continue to monitor every 4-6 months with labs and clinic visits.  -Advised pt that previously visualized right kidney mass could have been caused by inflammation.  -Recommend pt control dietary sodium to help with leg swelling. -Will see back in 4 months with labs    FOLLOW UP: RTC with Dr Irene Limbo with labs in 4 months   The total time spent in the appt was 20 minutes and more than 50% was on counseling and direct patient cares.  All of the patient's questions were answered with apparent satisfaction. The patient knows to call the clinic with any problems, questions or concerns.   Sullivan Lone MD [place] [facility]IV[facility] Hematology/Oncology Physician [facility]  (Office):       [phone] (Work cell):  [phone] (Fax):           [phone]  11/02/2019 10:25 AM  I, Yevette Edwards, am acting as a scribe for Dr. Sullivan Lone.   .I have reviewed the above documentation for accuracy and completeness, and I agree with the above. Brunetta Genera MD

## 2019-11-02 ENCOUNTER — Inpatient Hospital Stay: Payer: Medicare Other | Attending: Hematology

## 2019-11-02 ENCOUNTER — Inpatient Hospital Stay: Payer: Medicare Other | Admitting: Hematology

## 2019-11-02 ENCOUNTER — Other Ambulatory Visit: Payer: Self-pay

## 2019-11-02 VITALS — BP 122/52 | HR 63 | Temp 97.9°F | Resp 18 | Ht 69.0 in | Wt 166.0 lb

## 2019-11-02 DIAGNOSIS — Z8572 Personal history of non-Hodgkin lymphomas: Secondary | ICD-10-CM | POA: Diagnosis not present

## 2019-11-02 DIAGNOSIS — C8208 Follicular lymphoma grade I, lymph nodes of multiple sites: Secondary | ICD-10-CM

## 2019-11-02 DIAGNOSIS — N289 Disorder of kidney and ureter, unspecified: Secondary | ICD-10-CM

## 2019-11-02 DIAGNOSIS — R599 Enlarged lymph nodes, unspecified: Secondary | ICD-10-CM | POA: Insufficient documentation

## 2019-11-02 DIAGNOSIS — N2889 Other specified disorders of kidney and ureter: Secondary | ICD-10-CM

## 2019-11-02 LAB — CBC WITH DIFFERENTIAL/PLATELET
Abs Immature Granulocytes: 0.04 10*3/uL (ref 0.00–0.07)
Basophils Absolute: 0 10*3/uL (ref 0.0–0.1)
Basophils Relative: 1 %
Eosinophils Absolute: 0.1 10*3/uL (ref 0.0–0.5)
Eosinophils Relative: 1 %
HCT: 39 % (ref 39.0–52.0)
Hemoglobin: 12.2 g/dL — ABNORMAL LOW (ref 13.0–17.0)
Immature Granulocytes: 1 %
Lymphocytes Relative: 18 %
Lymphs Abs: 1.5 10*3/uL (ref 0.7–4.0)
MCH: 28.2 pg (ref 26.0–34.0)
MCHC: 31.3 g/dL (ref 30.0–36.0)
MCV: 90.3 fL (ref 80.0–100.0)
Monocytes Absolute: 0.8 10*3/uL (ref 0.1–1.0)
Monocytes Relative: 10 %
Neutro Abs: 5.9 10*3/uL (ref 1.7–7.7)
Neutrophils Relative %: 69 %
Platelets: 224 10*3/uL (ref 150–400)
RBC: 4.32 MIL/uL (ref 4.22–5.81)
RDW: 13.3 % (ref 11.5–15.5)
WBC: 8.3 10*3/uL (ref 4.0–10.5)
nRBC: 0 % (ref 0.0–0.2)

## 2019-11-02 LAB — LACTATE DEHYDROGENASE: LDH: 158 U/L (ref 98–192)

## 2019-11-02 LAB — CMP (CANCER CENTER ONLY)
ALT: 9 U/L (ref 0–44)
AST: 12 U/L — ABNORMAL LOW (ref 15–41)
Albumin: 3.9 g/dL (ref 3.5–5.0)
Alkaline Phosphatase: 65 U/L (ref 38–126)
Anion gap: 10 (ref 5–15)
BUN: 20 mg/dL (ref 8–23)
CO2: 28 mmol/L (ref 22–32)
Calcium: 10 mg/dL (ref 8.9–10.3)
Chloride: 104 mmol/L (ref 98–111)
Creatinine: 0.99 mg/dL (ref 0.61–1.24)
GFR, Est AFR Am: >60 mL/min (ref >60)
GFR, Estimated: >60 mL/min (ref >60)
Glucose, Bld: 93 mg/dL (ref 70–99)
Potassium: 3.9 mmol/L (ref 3.5–5.1)
Sodium: 142 mmol/L (ref 135–145)
Total Bilirubin: 0.8 mg/dL (ref 0.3–1.2)
Total Protein: 6.8 g/dL (ref 6.5–8.1)

## 2020-02-23 ENCOUNTER — Telehealth: Payer: Self-pay | Admitting: Hematology

## 2020-02-23 NOTE — Telephone Encounter (Signed)
Scheduled per los, patient has been called and notified regarding 12/17 appointment.

## 2020-03-03 ENCOUNTER — Telehealth: Payer: Self-pay | Admitting: Cardiovascular Disease

## 2020-03-03 ENCOUNTER — Ambulatory Visit: Payer: Medicare Other | Admitting: Hematology

## 2020-03-03 ENCOUNTER — Other Ambulatory Visit: Payer: Medicare Other

## 2020-03-03 NOTE — Telephone Encounter (Signed)
Pt c/o swelling: STAT is pt has developed SOB within 24 hours  1) How much weight have you gained and in what time span? Pt states he hasn't gained any weight from swelling.  2) If swelling, where is the swelling located? Feet.  3) Are you currently taking a fluid pill? Yes.  4) Are you currently SOB? No.  5) Do you have a log of your daily weights (if so, list)? No.  6) Have you gained 3 pounds in a day or 5 pounds in a week? No.  7) Have you traveled recently? No.  Patient is requesting an appt in regards to this.

## 2020-03-06 NOTE — Telephone Encounter (Signed)
I called patient back. He noticed feet/ankle swelling since just after Thanksgiving.  Has been monitoring and thought it would go away.   His weight is stable. No SOB. Taking Lasix 20 mg daily. Thinks he probably ate extra salt at Thanksgiving and afterwards.  Adv pt per Dr. Angelena Form increase lasix to 40 mg daily for 3 days and then go back to 20 mg daily. Adv to call back by Thursday this week if no improvement in swelling.  Scheduled his one year follow up. Pt in agreement with this plan.

## 2020-03-21 ENCOUNTER — Inpatient Hospital Stay: Payer: Medicare Other | Admitting: Hematology

## 2020-03-21 ENCOUNTER — Inpatient Hospital Stay: Payer: Medicare Other | Attending: Hematology

## 2020-03-21 ENCOUNTER — Telehealth: Payer: Self-pay | Admitting: Hematology

## 2020-03-21 ENCOUNTER — Other Ambulatory Visit: Payer: Self-pay

## 2020-03-21 VITALS — BP 134/57 | HR 66 | Temp 98.1°F | Resp 16 | Ht 69.0 in | Wt 165.0 lb

## 2020-03-21 DIAGNOSIS — Z85820 Personal history of malignant melanoma of skin: Secondary | ICD-10-CM | POA: Insufficient documentation

## 2020-03-21 DIAGNOSIS — I10 Essential (primary) hypertension: Secondary | ICD-10-CM | POA: Diagnosis not present

## 2020-03-21 DIAGNOSIS — E785 Hyperlipidemia, unspecified: Secondary | ICD-10-CM | POA: Diagnosis not present

## 2020-03-21 DIAGNOSIS — C8208 Follicular lymphoma grade I, lymph nodes of multiple sites: Secondary | ICD-10-CM

## 2020-03-21 DIAGNOSIS — I2510 Atherosclerotic heart disease of native coronary artery without angina pectoris: Secondary | ICD-10-CM | POA: Diagnosis not present

## 2020-03-21 DIAGNOSIS — Z79899 Other long term (current) drug therapy: Secondary | ICD-10-CM | POA: Diagnosis not present

## 2020-03-21 DIAGNOSIS — C8291 Follicular lymphoma, unspecified, lymph nodes of head, face, and neck: Secondary | ICD-10-CM | POA: Insufficient documentation

## 2020-03-21 DIAGNOSIS — N289 Disorder of kidney and ureter, unspecified: Secondary | ICD-10-CM

## 2020-03-21 LAB — CMP (CANCER CENTER ONLY)
ALT: 8 U/L (ref 0–44)
AST: 12 U/L — ABNORMAL LOW (ref 15–41)
Albumin: 3.8 g/dL (ref 3.5–5.0)
Alkaline Phosphatase: 63 U/L (ref 38–126)
Anion gap: 8 (ref 5–15)
BUN: 22 mg/dL (ref 8–23)
CO2: 28 mmol/L (ref 22–32)
Calcium: 9.4 mg/dL (ref 8.9–10.3)
Chloride: 104 mmol/L (ref 98–111)
Creatinine: 1 mg/dL (ref 0.61–1.24)
GFR, Estimated: >60 mL/min (ref >60)
Glucose, Bld: 83 mg/dL (ref 70–99)
Potassium: 3.9 mmol/L (ref 3.5–5.1)
Sodium: 140 mmol/L (ref 135–145)
Total Bilirubin: 0.7 mg/dL (ref 0.3–1.2)
Total Protein: 6.7 g/dL (ref 6.5–8.1)

## 2020-03-21 LAB — CBC WITH DIFFERENTIAL/PLATELET
Abs Immature Granulocytes: 0.03 10*3/uL (ref 0.00–0.07)
Basophils Absolute: 0 10*3/uL (ref 0.0–0.1)
Basophils Relative: 1 %
Eosinophils Absolute: 0.1 10*3/uL (ref 0.0–0.5)
Eosinophils Relative: 1 %
HCT: 36.2 % — ABNORMAL LOW (ref 39.0–52.0)
Hemoglobin: 11.8 g/dL — ABNORMAL LOW (ref 13.0–17.0)
Immature Granulocytes: 0 %
Lymphocytes Relative: 14 %
Lymphs Abs: 1.2 10*3/uL (ref 0.7–4.0)
MCH: 28.2 pg (ref 26.0–34.0)
MCHC: 32.6 g/dL (ref 30.0–36.0)
MCV: 86.4 fL (ref 80.0–100.0)
Monocytes Absolute: 0.9 10*3/uL (ref 0.1–1.0)
Monocytes Relative: 11 %
Neutro Abs: 6.1 10*3/uL (ref 1.7–7.7)
Neutrophils Relative %: 73 %
Platelets: 239 10*3/uL (ref 150–400)
RBC: 4.19 MIL/uL — ABNORMAL LOW (ref 4.22–5.81)
RDW: 13.5 % (ref 11.5–15.5)
WBC: 8.4 10*3/uL (ref 4.0–10.5)
nRBC: 0 % (ref 0.0–0.2)

## 2020-03-21 LAB — LACTATE DEHYDROGENASE: LDH: 154 U/L (ref 98–192)

## 2020-03-21 NOTE — Progress Notes (Signed)
HEMATOLOGY/ONCOLOGY CLINIC NOTE  Date of Service: 03/21/2020  Patient Care Team: [name], MD as PCP - General (Family Medicine) [name], MD as PCP - Cardiology (Cardiology)  CHIEF COMPLAINTS/PURPOSE OF CONSULTATION:  mx of low grade Follicular Lymphoma  HISTORY OF PRESENTING ILLNESS:   Harold [name] is a wonderful 86 y.o. male who has been referred to [place] by Dr [name] for evaluation and management of Follicular Lymphoma. He is accompanied today by his sister. The pt reports that he is doing well overall.   The pt reports first noticing the rt neck mass develop in his neck about a year ago and saw his PCP Dr [name].  The mass was getting larger and so he was referred to Dr [name] for further evaluation and had a biopsy of the rt upper neck mass which showed Grade 1-2 follicular lymphoma. pt has had Tissue flow cytometry completed on 08/05/17 with results revealing A LAMBDA-RESTRICTED MONOCLONAL B-CELL POPULATION EXPRESSING CD10 COMPRISES 39% OF ALL LYMPHOCYTES.  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 He denies feeling any differently now than he did a year ago. He takes daily 81mg  aspirin which he notes has led to some mild increased bruising.   Of note prior to the patient's visit today,  He notes that he has had a couple spots of melanoma on his chin and back about 5 and 8 years ago. These were subsequently removed. He has had a basal cell as well and has been treated several times with liquid nitrogen.   On review of systems, pt reports stable energy levels, some bruising, stable bowel habits, and denies fevers, chills, night sweats, unexpected weight loss, pain along the spine, abdominal pains, testicular pain or swelling, and any other symptoms.   On PMHx the pt reports melanoma, CAD,  HTN, HLD, cataract in left eye.   INTERVAL HISTORY:  [name] returns today for management and evaluation of his follicular lymphoma. The patient's last visit with [place] was on 11/02/2019. The pt reports that he is doing well overall.  The pt reports that he has been experiencing some arm and elbow pain. He also feels the lymph nodes are bigger and more scattered around, but are not bothersome to him. The pt also experiences intermittent fatigue and imbalance, but is able to walk 100 feet to his mailbox with a walker. He is worried regarding his lack of energy.  The pt notes that he has leg swelling that started Thanksgiving, in both legs.   The pt noted he had cataract surgery since his last visit. There has been improvement in eyesight since this.  Lab results today (03/21/20) of CBC w/diff and CMP is as follows: all values are WNL except for RBC at 4.19, Hgb at 11.8, HCT at 36.2, AST at 12. 03/21/2020 LDH at 154  On review of systems, pt reports arm/elbow pain, intermittent fatigue, knee pain, imbalance, feet/leg swelling, and denies fever, chills, night sweats, unexpected weight loss, abdominal pain and any other symptoms.   MEDICAL HISTORY:  Past Medical History:  Diagnosis Date  . BPH (benign prostatic hypertrophy)   . CAD (coronary artery disease) 01/2010   S/P drug eluting stents LAD and PL branch of RCA Dr. Olevia Perches  . follicular lymphoma dx'd 06/2017  . Hernia   . HLD (hyperlipidemia)   .  HTN (hypertension)     SURGICAL HISTORY: Past Surgical History:  Procedure Laterality Date  . BACK SURGERY    . CYSTOSCOPY WITH LITHOLAPAXY N/A 05/20/2019   Procedure: CYSTOSCOPY WITH LITHOLAPAXY;  Surgeon: Crist Fat, MD;  Location: [facility];  Service: Urology;  Laterality: N/A;  . CYSTOSCOPY/URETEROSCOPY/HOLMIUM LASER/STENT PLACEMENT Left 05/20/2019   Procedure: CYSTOSCOPY/URETEROSCOPY/HOLMIUM LASER/STENT PLACEMENT;  Surgeon: Crist Fat, MD;  Location: [facility];  Service:  Urology;  Laterality: Left;  . HERNIA REPAIR  01/08/11   RIH  . MASS EXCISION Right 08/05/2017   Procedure: EXCISION RIGHT POSTERIOR  NECK LYMPH NODE;  Surgeon: Drema Halon, MD;  Location: [facility];  Service: ENT;  Laterality: Right;    SOCIAL HISTORY: Social History   Socioeconomic History  . Marital status: Married    Spouse name: Not on file  . Number of children: Not on file  . Years of education: Not on file  . Highest education level: Not on file  Occupational History  . Not on file  Tobacco Use  . Smoking status: Never Smoker  . Smokeless tobacco: Never Used  Vaping Use  . Vaping Use: Never used  Substance and Sexual Activity  . Alcohol use: No  . Drug use: No  . Sexual activity: Not on file  Other Topics Concern  . Not on file  Social History Narrative   Married   Social Determinants of Health   Financial Resource Strain: Not on file  Food Insecurity: Not on file  Transportation Needs: Not on file  Physical Activity: Not on file  Stress: Not on file  Social Connections: Not on file  Intimate Partner Violence: Not on file    FAMILY HISTORY: Family History  Problem Relation Age of Onset  . Heart failure Mother     ALLERGIES:  has No Known Allergies.  MEDICATIONS:  Current Outpatient Medications  Medication Sig Dispense Refill  . allopurinol (ZYLOPRIM) 100 MG tablet Take 1 tablet (100 mg total) by mouth 2 (two) times daily. (Patient not taking: Reported on 05/12/2019) 30 tablet 0  . aspirin 81 MG tablet Take 81 mg by mouth daily.      [name] atenolol (TENORMIN) 25 MG tablet Take 1 tablet (25 mg total) by mouth daily. 90 tablet 3  . brimonidine (ALPHAGAN) 0.2 % ophthalmic solution Place 1 drop into the right eye 2 (two) times daily.      . cholecalciferol (VITAMIN D) 1000 UNITS tablet Take 1,000 Units by mouth daily.      . Cyanocobalamin (B-12) 2500 MCG TABS Take 2,500 mcg by mouth daily.    . finasteride (PROSCAR) 5 MG tablet Take  5 mg by mouth daily.      . furosemide (LASIX) 20 MG tablet Take 1 tablet (20 mg total) by mouth daily. 90 tablet 3  . gabapentin (NEURONTIN) 100 MG capsule Take 100 mg by mouth daily as needed (knee pain).     . isosorbide dinitrate (ISORDIL) 5 MG tablet Take 1 tablet (5 mg total) by mouth 2 (two) times daily. 180 tablet 3  . lisinopril (PRINIVIL,ZESTRIL) 10 MG tablet Take 1 tablet (10 mg total) by mouth daily. 90 tablet 3  . nitroGLYCERIN (NITROSTAT) 0.4 MG SL tablet Place 1 tablet (0.4 mg total) under the tongue every 5 (five) minutes as needed for chest pain. 25 tablet 6  . ondansetron (ZOFRAN) 8 MG tablet Take 1 tablet (8 mg total) by mouth every 8 (eight) hours as needed for nausea. (  Patient not taking: Reported on 05/12/2019) 30 tablet 0  . phenazopyridine (PYRIDIUM) 200 MG tablet Take 1 tablet (200 mg total) by mouth 3 (three) times daily as needed for pain. (Patient not taking: Reported on 07/02/2019) 10 tablet 0  . pravastatin (PRAVACHOL) 40 MG tablet Take 1 tablet (40 mg total) by mouth at bedtime. (Patient taking differently: Take 40 mg by mouth every Monday, Wednesday, and Friday. ) 90 tablet 3  . timolol (TIMOPTIC) 0.5 % ophthalmic solution Place 1 drop into the right eye daily.      . traMADol (ULTRAM) 50 MG tablet Take 1-2 tablets (50-100 mg total) by mouth every 6 (six) hours as needed for moderate pain. 15 tablet 0   No current facility-administered medications for this visit.    REVIEW OF SYSTEMS:   A 10+ POINT REVIEW OF SYSTEMS WAS OBTAINED including neurology, dermatology, psychiatry, cardiac, respiratory, lymph, extremities, GI, GU, Musculoskeletal, constitutional, breasts, reproductive, HEENT.  All pertinent positives are noted in the HPI.  All others are negative.   PHYSICAL EXAMINATION: ECOG FS:2 - Symptomatic, <50% confined to bed  Exam performed in a chair.  Vitals:   03/21/20 1454  BP: (!) 134/57  Pulse: 66  Resp: 16  Temp: 98.1 F (36.7 C)  SpO2: 100%   Wt  Readings from Last 3 Encounters:  03/21/20 165 lb (74.8 kg)  11/02/19 166 lb (75.3 kg)  07/02/19 166 lb 9.6 oz (75.6 kg)   Body mass index is 24.37 kg/m.    GENERAL:alert, in no acute distress and comfortable SKIN: no acute rashes, no significant lesions EYES: conjunctiva are pink and non-injected, sclera anicteric OROPHARYNX: MMM, no exudates, no oropharyngeal erythema or ulceration NECK: supple, no JVD LYMPH:  Many, small lymph nodes in the cervical region increased from last visit, b/l lymphadenopathy in the axillary regions, enlarging lymph node in right inguinal region.  LUNGS: clear to auscultation b/l with normal respiratory effort HEART: regular rate & rhythm ABDOMEN:  normoactive bowel sounds , non tender, not distended. No palpable hepatosplenomegaly.  Extremity: no pedal edema PSYCH: alert & oriented x 3 with fluent speech NEURO: no focal motor/sensory deficits  LABORATORY DATA:  I have reviewed the data as listed  . CBC Latest Ref Rng & Units 03/21/2020 11/02/2019 10/01/2019  WBC 4.0 - 10.5 K/uL 8.4 8.3 9.5  Hemoglobin 13.0 - 17.0 g/dL 11.8(L) 12.2(L) 11.7(L)  Hematocrit 39.0 - 52.0 % 36.2(L) 39.0 36.5(L)  Platelets 150 - 400 K/uL 239 224 211    . CMP Latest Ref Rng & Units 03/21/2020 11/02/2019 10/01/2019  Glucose 70 - 99 mg/dL 83 93 98  BUN 8 - 23 mg/dL 22 20 21   Creatinine 0.61 - 1.24 mg/dL 1.00 0.99 0.92  Sodium 135 - 145 mmol/L 140 142 140  Potassium 3.5 - 5.1 mmol/L 3.9 3.9 4.0  Chloride 98 - 111 mmol/L 104 104 106  CO2 22 - 32 mmol/L 28 28 25   Calcium 8.9 - 10.3 mg/dL 9.4 10.0 9.2  Total Protein 6.5 - 8.1 g/dL 6.7 6.8 6.6  Total Bilirubin 0.3 - 1.2 mg/dL 0.7 0.8 0.9  Alkaline Phos 38 - 126 U/L 63 65 63  AST 15 - 41 U/L 12(L) 12(L) 14(L)  ALT 0 - 44 U/L 8 9 10   uric acid 6.4  . Lab Results  Component Value Date   LDH 154 03/21/2020     02/18/2019 CT Abdomen Pelvis W Contrast (Accession [id number]):   02/18/2019 CT Chest W Contrast (Accession  [id number]):  08/08/17  Pathology:   08/05/17 Pathology:   RADIOGRAPHIC STUDIES: I have personally reviewed the radiological images as listed and agreed with the findings in the report. No results found.  ASSESSMENT & PLAN:   86 y.o. male with  1.h/o  atleast Stage III Follicular Lymphoma Grade 1-2 presenting with rt cervical lymphadenopathy  08/05/17 pathology results of right cervical LN revealing low grade follicular lymphoma  [id number] PET revealed Hypermetabolic lymphadenopathy involving the right neck, bilateral axillary, right hilar and right inguinal lymph nodes. No abdominal or pelvic involvement.    03/20/18 CT C/A/P revealed Progressive bilateral axillary and right inguinal lymphadenopathy, including necrotic lymph nodes in the right axilla, as above. Spleen is normal in size. 4 mm distal left ureteral calculus, unchanged. No hydronephrosis. Stable layering bladder calculi measuring up to 5 mm. Cholelithiasis, without associated inflammatory changes. Stable sequela of prior/chronic pancreatitis.   11/03/2018 CT neck revealed "Marked interval progression of cervical lymphadenopathy since PET-CT 09/02/2017, now with lymphadenopathy present bilaterally at essentially all stations. Index nodes as described. This includes bilateral intraparotid lymphadenopathy. Bulky right-sided adenopathy results in multifocal narrowing of the right internal jugular vein. Significant narrowing of the mid to lower left internal jugular vein of unclear cause as there is no significant mass effect at this level."  11/03/2018 CT CAP revealed "1. Progression of bilateral axillary, retroperitoneal, pelvic, and inguinal adenopathy. New anterior and posterior lower cervical adenopathy identified within the neck. 2. Persistent distal left ureteral calculus. Small bladder calculi are unchanged. 3. Aortic Atherosclerosis (ICD10-I70.0). Coronary artery calcifications. 4. Sequelae of chronic pancreatitis  noted."  10/01/2019 CT Abd/Pel ([id number]) revealed no evidence of mass in right kidney, new and enlarged abdominal and pelvic lymph nodes.   2. 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 3. H/o head and neck Basal cell carcinoma Continue routine skin checks with dermatologist    4. Renal indeterminate lesion -06/28/19 Renal US [id number]) revealed New right-sided mass at the hilum which measures 1.5cm in size.    5. Urinary stones in ureter and bladder.  PLAN: -Discussed pt labwork today, 03/21/20; blood counts stable, PLT and WBC nml, LDH stable, and blood chemistries WNL.  -Some progression of Follicular Lymphoma on clinical exam. Enlarged pelvic lymph nodes could be contributing to leg swelling.  -Advised pt that completing 4 additional cycles of Rituxan may be needed depending on scans. -Recommended the pt take a Multivitamin daily, alongside continuing the Vitamin D and B12 daily.  -Recommended PET/CT if insurance allows, or a repeat CT Abd/Pel. Will get in 1 week. -Continue the 20mg  daily Lasix -Will see back in 2 weeks    FOLLOW UP:  PET/CT in 1 week MD visit in 2 weeks   The total time spent in the appt was 25 minutes and more than 50% was on counseling and direct patient cares.  All of the patient's questions were answered with apparent satisfaction. The patient knows to call the clinic with any problems, questions or concerns.   Sullivan Lone MD [place] AAHIVMS [facility] Hematology/Oncology Physician [facility]  (Office):       [phone] (Work cell):  [phone] (Fax):           [phone]  03/21/2020 4:21 PM  I, Yevette Edwards, am acting as a scribe for Dr. Sullivan Lone.   .I have reviewed the above documentation for accuracy and completeness, and I agree with the above. Brunetta Genera MD

## 2020-03-21 NOTE — Telephone Encounter (Signed)
Scheduled appointment per 1/4 los. Spoke to patient who is aware of appointment date and time.  

## 2020-04-03 ENCOUNTER — Other Ambulatory Visit: Payer: Self-pay | Admitting: Hematology

## 2020-04-03 DIAGNOSIS — C8208 Follicular lymphoma grade I, lymph nodes of multiple sites: Secondary | ICD-10-CM

## 2020-04-04 ENCOUNTER — Telehealth: Payer: Self-pay | Admitting: *Deleted

## 2020-04-04 ENCOUNTER — Telehealth: Payer: Self-pay | Admitting: Hematology

## 2020-04-04 NOTE — Telephone Encounter (Signed)
Patient has CT scan scheduled on 1/27 (PET was denied by insurance) Patient requested to reschedule the appt with ?Dr. Irene Limbo currently on 1/21 to review results until after the Ct scan. Appt on 1/21 with Dr. Irene Limbo cancelled. Schedule message sent to r/s appt. LVM on daughter (Ms. Arturo Morton) phone with all information

## 2020-04-04 NOTE — Telephone Encounter (Signed)
Per sch msg Harold [name] has been scheduled to see Dr. Irene Limbo on 1/31 ay 920am to dicsuss the results of his CT. Appt date and time has been given to the pt's daughter.

## 2020-04-05 ENCOUNTER — Ambulatory Visit (HOSPITAL_COMMUNITY): Payer: Medicare Other

## 2020-04-07 ENCOUNTER — Ambulatory Visit: Payer: Medicare Other | Admitting: Hematology

## 2020-04-13 ENCOUNTER — Ambulatory Visit (HOSPITAL_COMMUNITY)
Admission: RE | Admit: 2020-04-13 | Discharge: 2020-04-13 | Disposition: A | Discharge status: Home / Self Care | Payer: Medicare Other | Source: Ambulatory Visit | Attending: Hematology | Admitting: Hematology

## 2020-04-13 ENCOUNTER — Encounter (HOSPITAL_COMMUNITY): Payer: Self-pay

## 2020-04-13 ENCOUNTER — Other Ambulatory Visit: Payer: Self-pay

## 2020-04-13 DIAGNOSIS — C8208 Follicular lymphoma grade I, lymph nodes of multiple sites: Secondary | ICD-10-CM | POA: Insufficient documentation

## 2020-04-13 MED ORDER — IOHEXOL 300 MG/ML  SOLN
100.0000 mL | Freq: Once | INTRAMUSCULAR | Status: AC | PRN
  Administered 2020-04-13: 100 mL via INTRAVENOUS

## 2020-04-13 MED ORDER — IOHEXOL 9 MG/ML PO SOLN
1000.0000 mL | ORAL | Status: AC
  Administered 2020-04-13: 1000 mL via ORAL

## 2020-04-13 MED ORDER — IOHEXOL 9 MG/ML PO SOLN
ORAL | Status: AC
  Filled 2020-04-13: qty 1000

## 2020-04-16 NOTE — Progress Notes (Signed)
HEMATOLOGY/ONCOLOGY CLINIC NOTE  Date of Service: 04/16/2020  Patient Care Team: [name], MD as PCP - General (Family Medicine) [name], MD as PCP - Cardiology (Cardiology)  CHIEF COMPLAINTS/PURPOSE OF CONSULTATION:  mx of low grade Follicular Lymphoma  HISTORY OF PRESENTING ILLNESS:   Harold [name] is a wonderful 86 y.o. male who has been referred to [place] by Dr [name] for evaluation and management of Follicular Lymphoma. He is accompanied today by his sister. The pt reports that he is doing well overall.   The pt reports first noticing the rt neck mass develop in his neck about a year ago and saw his PCP Dr [name].  The mass was getting larger and so he was referred to Dr [name] for further evaluation and had a biopsy of the rt upper neck mass which showed Grade 1-2 follicular lymphoma. pt has had Tissue flow cytometry completed on 08/05/17 with results revealing A LAMBDA-RESTRICTED MONOCLONAL B-CELL POPULATION EXPRESSING CD10 COMPRISES 39% OF ALL LYMPHOCYTES.  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 He denies feeling any differently now than he did a year ago. He takes daily 81mg  aspirin which he notes has led to some mild increased bruising.   Of note prior to the patient's visit today,  He notes that he has had a couple spots of melanoma on his chin and back about 5 and 8 years ago. These were subsequently removed. He has had a basal cell as well and has been treated several times with liquid nitrogen.   On review of systems, pt reports stable energy levels, some bruising, stable bowel habits, and denies fevers, chills, night sweats, unexpected weight loss, pain along the spine, abdominal pains, testicular pain or swelling, and any other symptoms.   On PMHx the pt reports melanoma, CAD,  HTN, HLD, cataract in left eye.   INTERVAL HISTORY:   Harold [name] returns today for management and evaluation of his follicular lymphoma. The patient's last visit with [place] was on 03/21/2020. The pt reports that he is doing well overall.  The pt reports that he has still been experienced chills and leg swelling unchanged from last visit. The pt notes that he has an appointment with his Cardiologist on 05/04/2020. He has an appt with his PCP on 04/25/2020.  The pt notes that he has started elevating his leg while sleeping and this has slightly reduced his swelling. He wears his compression socks and has a recliner to elevate his leg. The pt also notes that he has been experiencing swelling in his foreskin, starting around Christmas time 2021.  Of note since the patient's last visit, pt has had CT Chest/Abd/Pel w contrast ([id number]) on 04/13/2020, which revealed "1. Increased size of the index and non index thoracic and abdominopelvic lymph nodes, consistent with progression of disease. 2. Stigmata of chronic pancreatitis without findings to suggest acute inflammation. 3. Unchanged tiny bladder calculi versus bladder wall calcifications layering along the dependent portion of the urinary bladder. 4. Aortic atherosclerosis."  The pt has had CT Soft Tissue Neck w contrast ([id number]) on 04/13/2020, which revealed "Persistent lymphadenopathy throughout the bilateral neck. As compared to the prior neck CT of 11/03/2018, there has been a mixed response to treatment with some nodes having increased in size, and some nodes having decreased in size, in the  interim. Please see nodal measurements provided.'  On review of systems, pt reports leg swelling, chills, foreskin swelling and denies abdominal pain, back pain, fevers, night sweats and any other symptoms.  MEDICAL HISTORY:  Past Medical History:  Diagnosis Date  . BPH (benign prostatic hypertrophy)   . CAD (coronary artery disease) 01/2010   S/P  drug eluting stents LAD and PL branch of RCA Dr. Olevia Perches  . follicular lymphoma dx'd 06/2017  . Hernia   . HLD (hyperlipidemia)   . HTN (hypertension)     SURGICAL HISTORY: Past Surgical History:  Procedure Laterality Date  . BACK SURGERY    . CYSTOSCOPY WITH LITHOLAPAXY N/A 05/20/2019   Procedure: CYSTOSCOPY WITH LITHOLAPAXY;  Surgeon: Ardis Hughs, MD;  Location: [facility];  Service: Urology;  Laterality: N/A;  . CYSTOSCOPY/URETEROSCOPY/HOLMIUM LASER/STENT PLACEMENT Left 05/20/2019   Procedure: CYSTOSCOPY/URETEROSCOPY/HOLMIUM LASER/STENT PLACEMENT;  Surgeon: Ardis Hughs, MD;  Location: [facility];  Service: Urology;  Laterality: Left;  . HERNIA REPAIR  01/08/11   RIH  . MASS EXCISION Right 08/05/2017   Procedure: EXCISION RIGHT POSTERIOR  NECK LYMPH NODE;  Surgeon: Rozetta Nunnery, MD;  Location: [facility];  Service: ENT;  Laterality: Right;    SOCIAL HISTORY: Social History   Socioeconomic History  . Marital status: Married    Spouse name: Not on file  . Number of children: Not on file  . Years of education: Not on file  . Highest education level: Not on file  Occupational History  . Not on file  Tobacco Use  . Smoking status: Never Smoker  . Smokeless tobacco: Never Used  Vaping Use  . Vaping Use: Never used  Substance and Sexual Activity  . Alcohol use: No  . Drug use: No  . Sexual activity: Not on file  Other Topics Concern  . Not on file  Social History Narrative   Married   Social Determinants of Health   Financial Resource Strain: Not on file  Food Insecurity: Not on file  Transportation Needs: Not on file  Physical Activity: Not on file  Stress: Not on file  Social Connections: Not on file  Intimate Partner Violence: Not on file    FAMILY HISTORY: Family History  Problem Relation Age of Onset  . Heart failure Mother     ALLERGIES:  has No Known Allergies.  MEDICATIONS:  Current Outpatient Medications  Medication Sig  Dispense Refill  . allopurinol (ZYLOPRIM) 100 MG tablet Take 1 tablet (100 mg total) by mouth 2 (two) times daily. (Patient not taking: Reported on 05/12/2019) 30 tablet 0  . aspirin 81 MG tablet Take 81 mg by mouth daily.      [name] atenolol (TENORMIN) 25 MG tablet Take 1 tablet (25 mg total) by mouth daily. 90 tablet 3  . brimonidine (ALPHAGAN) 0.2 % ophthalmic solution Place 1 drop into the right eye 2 (two) times daily.      . cholecalciferol (VITAMIN D) 1000 UNITS tablet Take 1,000 Units by mouth daily.      . Cyanocobalamin (B-12) 2500 MCG TABS Take 2,500 mcg by mouth daily.    . finasteride (PROSCAR) 5 MG tablet Take 5 mg by mouth daily.      . furosemide (LASIX) 20 MG tablet Take 1 tablet (20 mg total) by mouth daily. 90 tablet 3  . gabapentin (NEURONTIN) 100 MG capsule Take 100 mg by mouth daily as needed (knee pain).     . isosorbide dinitrate (ISORDIL) 5 MG tablet  Take 1 tablet (5 mg total) by mouth 2 (two) times daily. 180 tablet 3  . lisinopril (PRINIVIL,ZESTRIL) 10 MG tablet Take 1 tablet (10 mg total) by mouth daily. 90 tablet 3  . nitroGLYCERIN (NITROSTAT) 0.4 MG SL tablet Place 1 tablet (0.4 mg total) under the tongue every 5 (five) minutes as needed for chest pain. 25 tablet 6  . ondansetron (ZOFRAN) 8 MG tablet Take 1 tablet (8 mg total) by mouth every 8 (eight) hours as needed for nausea. (Patient not taking: Reported on 05/12/2019) 30 tablet 0  . phenazopyridine (PYRIDIUM) 200 MG tablet Take 1 tablet (200 mg total) by mouth 3 (three) times daily as needed for pain. (Patient not taking: Reported on 07/02/2019) 10 tablet 0  . pravastatin (PRAVACHOL) 40 MG tablet Take 1 tablet (40 mg total) by mouth at bedtime. (Patient taking differently: Take 40 mg by mouth every Monday, Wednesday, and Friday. ) 90 tablet 3  . timolol (TIMOPTIC) 0.5 % ophthalmic solution Place 1 drop into the right eye daily.      . traMADol (ULTRAM) 50 MG tablet Take 1-2 tablets (50-100 mg total) by mouth every 6 (six)  hours as needed for moderate pain. 15 tablet 0   No current facility-administered medications for this visit.    REVIEW OF SYSTEMS:   10 Point review of Systems was done is negative except as noted above.  PHYSICAL EXAMINATION: ECOG FS:2 - Symptomatic, <50% confined to bed  There were no vitals filed for this visit. Wt Readings from Last 3 Encounters:  03/21/20 165 lb (74.8 kg)  11/02/19 166 lb (75.3 kg)  07/02/19 166 lb 9.6 oz (75.6 kg)   There is no height or weight on file to calculate BMI.    Exam was given in a chair.   GENERAL:alert, in no acute distress and comfortable SKIN: no acute rashes, no significant lesions EYES: conjunctiva are pink and non-injected, sclera anicteric OROPHARYNX: MMM, no exudates, no oropharyngeal erythema or ulceration NECK: supple, no JVD LYMPH:  no palpable lymphadenopathy in the cervical, axillary or inguinal regions.  LUNGS: clear to auscultation b/l with normal respiratory effort HEART: regular rate & rhythm ABDOMEN:  normoactive bowel sounds , non tender, not distended. Extremity: no pedal edema. 2+ b/l leg swelling.  PSYCH: alert & oriented x 3 with fluent speech NEURO: no focal motor/sensory deficits  LABORATORY DATA:  I have reviewed the data as listed  . CBC Latest Ref Rng & Units 03/21/2020 11/02/2019 10/01/2019  WBC 4.0 - 10.5 K/uL 8.4 8.3 9.5  Hemoglobin 13.0 - 17.0 g/dL 11.8(L) 12.2(L) 11.7(L)  Hematocrit 39.0 - 52.0 % 36.2(L) 39.0 36.5(L)  Platelets 150 - 400 K/uL 239 224 211    . CMP Latest Ref Rng & Units 03/21/2020 11/02/2019 10/01/2019  Glucose 70 - 99 mg/dL 83 93 98  BUN 8 - 23 mg/dL 22 20 21   Creatinine 0.61 - 1.24 mg/dL 1.00 0.99 0.92  Sodium 135 - 145 mmol/L 140 142 140  Potassium 3.5 - 5.1 mmol/L 3.9 3.9 4.0  Chloride 98 - 111 mmol/L 104 104 106  CO2 22 - 32 mmol/L 28 28 25   Calcium 8.9 - 10.3 mg/dL 9.4 10.0 9.2  Total Protein 6.5 - 8.1 g/dL 6.7 6.8 6.6  Total Bilirubin 0.3 - 1.2 mg/dL 0.7 0.8 0.9  Alkaline Phos  38 - 126 U/L 63 65 63  AST 15 - 41 U/L 12(L) 12(L) 14(L)  ALT 0 - 44 U/L 8 9 10   uric acid 6.4  .  Lab Results  Component Value Date   LDH 154 03/21/2020     02/18/2019 CT Abdomen Pelvis W Contrast (Accession [id number]):   02/18/2019 CT Chest W Contrast (Accession [id number]):  08/08/17 Pathology:   08/05/17 Pathology:   RADIOGRAPHIC STUDIES: I have personally reviewed the radiological images as listed and agreed with the findings in the report. CT Soft Tissue Neck W Contrast  Result Date: 04/13/2020 CLINICAL DATA:  Follicular lymphoma grade 1, lymph nodes of multiple sites. Hematologic malignancy, surveillance; patient with clinical progression of non-Hodgkin's lymphoma for pretreatment restaging. EXAM: CT NECK WITH CONTRAST TECHNIQUE: Multidetector CT imaging of the neck was performed using the standard protocol following the bolus administration of intravenous contrast. CONTRAST:  144mL OMNIPAQUE IOHEXOL 300 MG/ML  SOLN COMPARISON:  Neck CT 11/03/2018. Same day CT chest/abdomen/pelvis 04/13/2020. FINDINGS: Pharynx and larynx: Streak artifact from dental restoration partially obscures the oral cavity. No appreciable swelling or discrete mass within the oral cavity, pharynx or larynx. Salivary glands: No inflammation, mass, or stone. Persistent intraparotid lymphadenopathy. Thyroid: Unremarkable. Lymph nodes: Persistent lymphadenopathy throughout the bilateral neck. There has been a mixed response to treatment when comparing with the prior CT neck of 11/03/2018 with some nodes having decreased in size and some nodes having increased in size in the interim. For instance, a previous index large node/nodal conglomerate at the right level 5 station has decreased in size now measuring 2.8 x 1.6 cm (series 12, image 20) (previously 3.9 x 2.6 cm). However, a left level 1 lymph node has increased in size, now measuring 2.0 x 1.4 cm (previously 1.5 x 0.9 cm). Additionally, a previous index node  within the left lower neck immediately lateral to the left common carotid artery has decreased in size (now measuring 1 cm in short axis (previously 1.8 cm). Vascular: The major vascular structures of the neck are patent. Atherosclerotic plaque within the visualized aortic arch, the major branch vessels of the neck and carotid bifurcations/ICAs. Limited intracranial: No acute intracranial abnormality identified. Visualized orbits: Incompletely imaged. No mass or acute finding at the imaged levels. Mastoids and visualized paranasal sinuses: No significant paranasal sinus disease or mastoid effusion at the imaged levels. Skeleton: No acute bony abnormality or aggressive osseous lesion. Unchanged cervical spondylosis. Upper chest: Separately reported. IMPRESSION: Persistent lymphadenopathy throughout the bilateral neck. As compared to the prior neck CT of 11/03/2018, there has been a mixed response to treatment with some nodes having increased in size, and some nodes having decreased in size, in the interim. Please see nodal measurements provided. Please refer to same-day CT thorax for description of intrathoracic findings. Electronically Signed   By: Kellie Simmering DO   On: 04/13/2020 13:59   CT CHEST ABDOMEN PELVIS W CONTRAST  Result Date: 04/13/2020 CLINICAL DATA:  Hematologic malignancy, surveillance with clinical progression of follicular lymphoma for pre treatment restaging. EXAM: CT CHEST, ABDOMEN, AND PELVIS WITH CONTRAST TECHNIQUE: Multidetector CT imaging of the chest, abdomen and pelvis was performed following the standard protocol during bolus administration of intravenous contrast. CONTRAST:  11mL OMNIPAQUE IOHEXOL 300 MG/ML  SOLN COMPARISON:  CT abdomen and pelvis October 01, 2019 and CT chest abdomen pelvis February 18, 2019 FINDINGS: CT CHEST FINDINGS Cardiovascular: Normal size heart. No pericardial effusion. Three-vessel coronary artery disease. Aortic atherosclerosis. Mediastinum/Nodes: No mediastinal  or hilar adenopathy. Increased axillary, subpectoral and supraclavicular lymphadenopathy. Previously indexed lesions are as follows. Dominant left axillary lymph node now measures 4.7 x 2.4 cm previously 2.4 x 0.8 cm (series 2, image 13). Right  axillary lymph node now measures 5.1 x 1.9 cm previously 2.1 x 0.6 cm (series 2, image 21). Lungs/Pleura: No suspicious pulmonary nodules or masses. No focal consolidation. No pleural effusion. Musculoskeletal: No suspicious lytic or blastic lesion of bone. Multilevel degenerative changes spine. CT ABDOMEN PELVIS FINDINGS Hepatobiliary: No focal liver abnormality is seen. No gallstones, gallbladder wall thickening, or biliary dilatation. Pancreas: Diffuse pancreatic calcifications compatible with chronic pancreatitis. No dilation of the main pancreatic duct. And no evidence of acute inflammation. Spleen: Normal in size without focal abnormality. Adrenals/Urinary Tract: Adrenal nodules or masses. No hydronephrosis. No suspicious renal masses. Tiny bladder calculi versus bladder wall calcifications layering along the dependent portion of the urinary bladder. Stomach/Bowel: Stomach is within normal limits. Colonic diverticulosis. No evidence of bowel wall thickening, distention, or inflammatory changes. Vascular/Lymphatic: Increased size of the index and non index abdominopelvic lymph nodes. Index lymph nodes are as follows: Right inguinal lymph node measures 4.5 x 4.1 cm previously 4.1 x 3.1 cm (series 2, image 121). Aortocaval lymph node measures 1.9 x 1.0 cm previously 0.9 x 0.8 cm (series 2, image 75). Reproductive: Prostatic calcifications. Other: No abdominopelvic ascites. Musculoskeletal: Multilevel degenerative change of the spine and multifocal degenerative joint disease. No suspicious lytic or blastic lesion of bone. IMPRESSION: 1. Increased size of the index and non index thoracic and abdominopelvic lymph nodes, consistent with progression of disease. 2. Stigmata of  chronic pancreatitis without findings to suggest acute inflammation. 3. Unchanged tiny bladder calculi versus bladder wall calcifications layering along the dependent portion of the urinary bladder. 4. Aortic atherosclerosis. Aortic Atherosclerosis (ICD10-I70.0). Electronically Signed   By: Dahlia Bailiff MD   On: 04/13/2020 14:02    ASSESSMENT & PLAN:   86 y.o. male with  1.h/o  atleast Stage III Follicular Lymphoma Grade 1-2 presenting with rt cervical lymphadenopathy  08/05/17 pathology results of right cervical LN revealing low grade follicular lymphoma  [id number] PET revealed Hypermetabolic lymphadenopathy involving the right neck, bilateral axillary, right hilar and right inguinal lymph nodes. No abdominal or pelvic involvement.    03/20/18 CT C/A/P revealed Progressive bilateral axillary and right inguinal lymphadenopathy, including necrotic lymph nodes in the right axilla, as above. Spleen is normal in size. 4 mm distal left ureteral calculus, unchanged. No hydronephrosis. Stable layering bladder calculi measuring up to 5 mm. Cholelithiasis, without associated inflammatory changes. Stable sequela of prior/chronic pancreatitis.   11/03/2018 CT neck revealed "Marked interval progression of cervical lymphadenopathy since PET-CT 09/02/2017, now with lymphadenopathy present bilaterally at essentially all stations. Index nodes as described. This includes bilateral intraparotid lymphadenopathy. Bulky right-sided adenopathy results in multifocal narrowing of the right internal jugular vein. Significant narrowing of the mid to lower left internal jugular vein of unclear cause as there is no significant mass effect at this level."  11/03/2018 CT CAP revealed "1. Progression of bilateral axillary, retroperitoneal, pelvic, and inguinal adenopathy. New anterior and posterior lower cervical adenopathy identified within the neck. 2. Persistent distal left ureteral calculus. Small bladder calculi are unchanged.  3. Aortic Atherosclerosis (ICD10-I70.0). Coronary artery calcifications. 4. Sequelae of chronic pancreatitis noted."  10/01/2019 CT Abd/Pel ([id number]) revealed no evidence of mass in right kidney, new and enlarged abdominal and pelvic lymph nodes.   2. 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 3. H/o head and neck Basal cell carcinoma Continue routine skin checks with dermatologist    4. Renal indeterminate lesion -06/28/19 Renal US [id number]) revealed New right-sided mass at the hilum which measures 1.5cm in size.   -resolved  on subsequent scans  5. Urinary stones in ureter and bladder.  PLAN: -Discussed pt CT Chest/Abd/Pel w contrast ([id number]) on 04/13/2020 and CT Soft Tissue Neck w contrast ([id number]) on 04/13/2020; lymph nodes have grown, no kidney mass visibile on last two scans. -Discussed pt recent labwork from 03/21/2020; mild anemia that could be contributing to his fatigue. -Advise pt there is evidence of some progression of Follicular Lymphoma. Enlarged pelvic lymph nodes could be contributing to leg swelling.  -Advised pt that we will start 4 additional cycles of Rituxan, once weekly. Following, we will begin maintenance dosage. The pt is agreeable to this. -Advised pt his chills could be due to the leg swelling.  -Continue Multivitamin, Vitamin D, and B12 daily.  -Advise pt to increase Lasix to 40 mg daily, one in morning and one at lunch time. -Will see back in 2 weeks with second dose Rituxan.   FOLLOW UP:  Plz schedule weekly Rituxan x 4 doses Labs with each treatment MD visit with 2nd and 4th dose of RItuxan (Patient does not have reliable phone service and prefers to get treatment dates and appointments prior to leaving clinic today)    The total time spent in the appointment was 30 minutes and more than 50% was on counseling and direct patient cares.  All of the patient's questions were answered with apparent satisfaction. The patient knows to call the clinic with  any problems, questions or concerns.   Sullivan Lone MD [place] AAHIVMS [facility] [employer] [facility] Hematology/Oncology Physician [facility]  (Office):       [phone] (Work cell):  [phone] (Fax):           [phone]  04/16/2020 9:44 AM  I, Reinaldo Raddle, am acting as scribe for Dr. Sullivan Lone, MD.    .I have reviewed the above documentation for accuracy and completeness, and I agree with the above. Brunetta Genera MD

## 2020-04-17 ENCOUNTER — Other Ambulatory Visit: Payer: Self-pay

## 2020-04-17 ENCOUNTER — Telehealth: Payer: Self-pay | Admitting: Hematology

## 2020-04-17 ENCOUNTER — Inpatient Hospital Stay: Payer: Medicare Other | Admitting: Hematology

## 2020-04-17 VITALS — BP 140/61 | HR 75 | Temp 98.1°F | Resp 18 | Ht 69.0 in | Wt 162.5 lb

## 2020-04-17 DIAGNOSIS — C8208 Follicular lymphoma grade I, lymph nodes of multiple sites: Secondary | ICD-10-CM

## 2020-04-17 DIAGNOSIS — C8291 Follicular lymphoma, unspecified, lymph nodes of head, face, and neck: Secondary | ICD-10-CM | POA: Diagnosis not present

## 2020-04-17 NOTE — Telephone Encounter (Signed)
Scheduled appointments per 1/31 los. Spoke to patient in office with the last 3 appointments, gave patient calendar print out. Called patient's daughter with 2/7 appointment per patient's request. Spoke to patient's daughter about appointment and she is aware of appointment time. Also left my number in case [name] wanted to speak with me directly about appointments.

## 2020-04-19 NOTE — Progress Notes (Signed)
The following biosimilar Ruxience (rituximab-pvvr) has been selected for use in this patient.  Kennith Center, Pharm.D., CPP 04/19/2020@4 :12 PM

## 2020-04-24 ENCOUNTER — Inpatient Hospital Stay: Payer: Medicare Other | Attending: Hematology

## 2020-04-24 ENCOUNTER — Inpatient Hospital Stay: Payer: Medicare Other

## 2020-04-24 ENCOUNTER — Other Ambulatory Visit: Payer: Self-pay

## 2020-04-24 VITALS — BP 128/50 | HR 73 | Temp 97.6°F | Resp 18 | Wt 166.2 lb

## 2020-04-24 DIAGNOSIS — C8291 Follicular lymphoma, unspecified, lymph nodes of head, face, and neck: Secondary | ICD-10-CM | POA: Insufficient documentation

## 2020-04-24 DIAGNOSIS — Z5111 Encounter for antineoplastic chemotherapy: Secondary | ICD-10-CM | POA: Diagnosis not present

## 2020-04-24 DIAGNOSIS — N200 Calculus of kidney: Secondary | ICD-10-CM | POA: Diagnosis not present

## 2020-04-24 DIAGNOSIS — Z872 Personal history of diseases of the skin and subcutaneous tissue: Secondary | ICD-10-CM | POA: Diagnosis not present

## 2020-04-24 DIAGNOSIS — Z79899 Other long term (current) drug therapy: Secondary | ICD-10-CM | POA: Diagnosis not present

## 2020-04-24 DIAGNOSIS — Z85820 Personal history of malignant melanoma of skin: Secondary | ICD-10-CM | POA: Diagnosis not present

## 2020-04-24 DIAGNOSIS — Z7189 Other specified counseling: Secondary | ICD-10-CM

## 2020-04-24 DIAGNOSIS — C8208 Follicular lymphoma grade I, lymph nodes of multiple sites: Secondary | ICD-10-CM

## 2020-04-24 LAB — CMP (CANCER CENTER ONLY)
ALT: 7 U/L (ref 0–44)
AST: 13 U/L — ABNORMAL LOW (ref 15–41)
Albumin: 3.8 g/dL (ref 3.5–5.0)
Alkaline Phosphatase: 68 U/L (ref 38–126)
Anion gap: 10 (ref 5–15)
BUN: 21 mg/dL (ref 8–23)
CO2: 28 mmol/L (ref 22–32)
Calcium: 9.5 mg/dL (ref 8.9–10.3)
Chloride: 104 mmol/L (ref 98–111)
Creatinine: 0.83 mg/dL (ref 0.61–1.24)
GFR, Estimated: >60 mL/min (ref >60)
Glucose, Bld: 96 mg/dL (ref 70–99)
Potassium: 3.9 mmol/L (ref 3.5–5.1)
Sodium: 142 mmol/L (ref 135–145)
Total Bilirubin: 0.8 mg/dL (ref 0.3–1.2)
Total Protein: 6.8 g/dL (ref 6.5–8.1)

## 2020-04-24 LAB — MAGNESIUM: Magnesium: 1.8 mg/dL (ref 1.7–2.4)

## 2020-04-24 LAB — CBC WITH DIFFERENTIAL/PLATELET
Abs Immature Granulocytes: 0.06 10*3/uL (ref 0.00–0.07)
Basophils Absolute: 0.1 10*3/uL (ref 0.0–0.1)
Basophils Relative: 1 %
Eosinophils Absolute: 0.1 10*3/uL (ref 0.0–0.5)
Eosinophils Relative: 1 %
HCT: 38.5 % — ABNORMAL LOW (ref 39.0–52.0)
Hemoglobin: 12.2 g/dL — ABNORMAL LOW (ref 13.0–17.0)
Immature Granulocytes: 1 %
Lymphocytes Relative: 14 %
Lymphs Abs: 1.1 10*3/uL (ref 0.7–4.0)
MCH: 27.5 pg (ref 26.0–34.0)
MCHC: 31.7 g/dL (ref 30.0–36.0)
MCV: 86.9 fL (ref 80.0–100.0)
Monocytes Absolute: 0.8 10*3/uL (ref 0.1–1.0)
Monocytes Relative: 10 %
Neutro Abs: 6.1 10*3/uL (ref 1.7–7.7)
Neutrophils Relative %: 73 %
Platelets: 274 10*3/uL (ref 150–400)
RBC: 4.43 MIL/uL (ref 4.22–5.81)
RDW: 13.2 % (ref 11.5–15.5)
WBC: 8.2 10*3/uL (ref 4.0–10.5)
nRBC: 0 % (ref 0.0–0.2)

## 2020-04-24 LAB — URIC ACID: Uric Acid, Serum: 5.5 mg/dL (ref 3.7–8.6)

## 2020-04-24 MED ORDER — DIPHENHYDRAMINE HCL 25 MG PO CAPS
ORAL_CAPSULE | ORAL | Status: AC
  Filled 2020-04-24: qty 2

## 2020-04-24 MED ORDER — SODIUM CHLORIDE 0.9 % IV SOLN
Freq: Once | INTRAVENOUS | Status: AC
  Filled 2020-04-24: qty 250

## 2020-04-24 MED ORDER — FAMOTIDINE IN NACL 20-0.9 MG/50ML-% IV SOLN
INTRAVENOUS | Status: AC
  Filled 2020-04-24: qty 50

## 2020-04-24 MED ORDER — ACETAMINOPHEN 325 MG PO TABS
650.0000 mg | ORAL_TABLET | Freq: Once | ORAL | Status: AC
  Administered 2020-04-24: 650 mg via ORAL

## 2020-04-24 MED ORDER — FAMOTIDINE IN NACL 20-0.9 MG/50ML-% IV SOLN
20.0000 mg | Freq: Once | INTRAVENOUS | Status: AC
  Administered 2020-04-24: 20 mg via INTRAVENOUS

## 2020-04-24 MED ORDER — SODIUM CHLORIDE 0.9 % IV SOLN
375.0000 mg/m2 | Freq: Once | INTRAVENOUS | Status: AC
  Administered 2020-04-24: 700 mg via INTRAVENOUS
  Filled 2020-04-24: qty 50

## 2020-04-24 MED ORDER — METHYLPREDNISOLONE SODIUM SUCC 125 MG IJ SOLR
125.0000 mg | Freq: Once | INTRAMUSCULAR | Status: AC
  Administered 2020-04-24: 125 mg via INTRAVENOUS

## 2020-04-24 MED ORDER — METHYLPREDNISOLONE SODIUM SUCC 125 MG IJ SOLR
INTRAMUSCULAR | Status: AC
  Filled 2020-04-24: qty 2

## 2020-04-24 MED ORDER — DIPHENHYDRAMINE HCL 25 MG PO CAPS
50.0000 mg | ORAL_CAPSULE | Freq: Once | ORAL | Status: AC
  Administered 2020-04-24: 50 mg via ORAL

## 2020-04-24 MED ORDER — ACETAMINOPHEN 325 MG PO TABS
ORAL_TABLET | ORAL | Status: AC
  Filled 2020-04-24: qty 2

## 2020-04-24 NOTE — Progress Notes (Signed)
The patient experienced an assisted fall during their infusion. Patient had had vital signs retaken and was ambulating to the restroom with IV pole. Patient tripped over IV pole wheels. RN witnessed and patient assisted to a seated position on the ground. Patient assessed for any signs of harm. Patient insisted that he was not hurt and was not experiencing any pain. Patient assisted by 2 RNs to the restroom.

## 2020-04-24 NOTE — Progress Notes (Signed)
Per pharmacy - will titrate patient based upon 1st time rituxan protocol.

## 2020-04-24 NOTE — Patient Instructions (Addendum)
[facility] Discharge Instructions for Patients Receiving Chemotherapy  Today you received the following chemotherapy agents: Rituxan   To help prevent nausea and vomiting after your treatment, we encourage you to take your nausea medication  as prescribed.    If you develop nausea and vomiting that is not controlled by your nausea medication, call the clinic.   BELOW ARE SYMPTOMS THAT SHOULD BE REPORTED IMMEDIATELY:  *FEVER GREATER THAN 100.5 F  *CHILLS WITH OR WITHOUT FEVER  NAUSEA AND VOMITING THAT IS NOT CONTROLLED WITH YOUR NAUSEA MEDICATION  *UNUSUAL SHORTNESS OF BREATH  *UNUSUAL BRUISING OR BLEEDING  TENDERNESS IN MOUTH AND THROAT WITH OR WITHOUT PRESENCE OF ULCERS  *URINARY PROBLEMS  *BOWEL PROBLEMS  UNUSUAL RASH Items with * indicate a potential emergency and should be followed up as soon as possible.  Feel free to call the clinic should you have any questions or concerns. The clinic phone number is [phone].  Please show the [facility] at check-in to the Emergency Department and triage nurse.  Rituximab Injection What is this medicine? RITUXIMAB (ri TUX i mab) is a monoclonal antibody. It is used to treat certain types of cancer like non-Hodgkin lymphoma and chronic lymphocytic leukemia. It is also used to treat rheumatoid arthritis, granulomatosis with polyangiitis, microscopic polyangiitis, and pemphigus vulgaris. This medicine may be used for other purposes; ask your health care provider or pharmacist if you have questions. COMMON BRAND NAME(S): RIABNI, Rituxan, RUXIENCE What should I tell my health care provider before I take this medicine? They need to know if you have any of these conditions:  chest pain  heart disease  infection especially a viral infection such as chickenpox, cold sores, hepatitis B, or herpes  immune system problems  irregular heartbeat or rhythm  kidney disease  low blood counts (white cells,  platelets, or red cells)  lung disease  recent or upcoming vaccine  an unusual or allergic reaction to rituximab, other medicines, foods, dyes, or preservatives  pregnant or trying to get pregnant  breast-feeding How should I use this medicine? This medicine is injected into a vein. It is given by a health care provider in a hospital or clinic setting. A special MedGuide will be given to you before each treatment. Be sure to read this information carefully each time. Talk to your health care provider about the use of this medicine in children. While this drug may be prescribed for children as young as 2 years for selected conditions, precautions do apply. Overdosage: If you think you have taken too much of this medicine contact a poison control center or emergency room at once. NOTE: This medicine is only for you. Do not share this medicine with others. What if I miss a dose? Keep appointments for follow-up doses. It is important not to miss your dose. Call your health care provider if you are unable to keep an appointment. What may interact with this medicine? Do not take this medicine with any of the following medicines:  live vaccines This medicine may also interact with the following medicines:  cisplatin This list may not describe all possible interactions. Give your health care provider a list of all the medicines, herbs, non-prescription drugs, or dietary supplements you use. Also tell them if you smoke, drink alcohol, or use illegal drugs. Some items may interact with your medicine. What should I watch for while using this medicine? Your condition will be monitored carefully while you are receiving this medicine. You may need  blood work done while you are taking this medicine. This medicine can cause serious infusion reactions. To reduce the risk your health care provider may give you other medicines to take before receiving this one. Be sure to follow the directions from your  health care provider. This medicine may increase your risk of getting an infection. Call your health care provider for advice if you get a fever, chills, sore throat, or other symptoms of a cold or flu. Do not treat yourself. Try to avoid being around people who are sick. Call your health care provider if you are around anyone with measles, chickenpox, or if you develop sores or blisters that do not heal properly. Avoid taking medicines that contain aspirin, acetaminophen, ibuprofen, naproxen, or ketoprofen unless instructed by your health care provider. These medicines may hide a fever. This medicine may cause serious skin reactions. They can happen weeks to months after starting the medicine. Contact your health care provider right away if you notice fevers or flu-like symptoms with a rash. The rash may be red or purple and then turn into blisters or peeling of the skin. Or, you might notice a red rash with swelling of the face, lips or lymph nodes in your neck or under your arms. In some patients, this medicine may cause a serious brain infection that may cause death. If you have any problems seeing, thinking, speaking, walking, or standing, tell your healthcare professional right away. If you cannot reach your healthcare professional, urgently seek other source of medical care. Do not become pregnant while taking this medicine or for at least 12 months after stopping it. Women should inform their health care provider if they wish to become pregnant or think they might be pregnant. There is potential for serious harm to an unborn child. Talk to your health care provider for more information. Women should use a reliable form of birth control while taking this medicine and for 12 months after stopping it. Do not breast-feed while taking this medicine or for at least 6 months after stopping it. What side effects may I notice from receiving this medicine? Side effects that you should report to your health  care provider as soon as possible:  allergic reactions (skin rash, itching or hives; swelling of the face, lips, or tongue)  diarrhea  edema (sudden weight gain; swelling of the ankles, feet, hands or other unusual swelling; trouble breathing)  fast, irregular heartbeat  heart attack (trouble breathing; pain or tightness in the chest, neck, back or arms; unusually weak or tired)  infection (fever, chills, cough, sore throat, pain or trouble passing urine)  kidney injury (trouble passing urine or change in the amount of urine)  liver injury (dark yellow or brown urine; general ill feeling or flu-like symptoms; loss of appetite, right upper belly pain; unusually weak or tired, yellowing of the eyes or skin)  low blood pressure (dizziness; feeling faint or lightheaded, falls; unusually weak or tired)  low red blood cell counts (trouble breathing; feeling faint; lightheaded, falls; unusually weak or tired)  mouth sores  redness, blistering, peeling, or loosening of the skin, including inside the mouth  stomach pain  unusual bruising or bleeding  wheezing (trouble breathing with loud or whistling sounds)  vomiting Side effects that usually do not require medical attention (report to your health care provider if they continue or are bothersome):  headache  joint pain  muscle cramps, pain  nausea This list may not describe all possible side effects. Call your  doctor for medical advice about side effects. You may report side effects to FDA at 1-800-FDA-1088. Where should I keep my medicine? This medicine is given in a hospital or clinic. It will not be stored at home. NOTE: This sheet is a summary. It may not cover all possible information. If you have questions about this medicine, talk to your doctor, pharmacist, or health care provider.  2021 [employer] (2019-12-16 21:35:50)

## 2020-04-25 ENCOUNTER — Telehealth: Payer: Self-pay | Admitting: *Deleted

## 2020-04-30 NOTE — Progress Notes (Signed)
HEMATOLOGY/ONCOLOGY CLINIC NOTE  Date of Service: 05/01/2020  Patient Care Team: [name], MD as PCP - General (Family Medicine) [name], MD as PCP - Cardiology (Cardiology)  CHIEF COMPLAINTS/PURPOSE OF CONSULTATION:  mx of low grade Follicular Lymphoma  HISTORY OF PRESENTING ILLNESS:   Harold [name] is a wonderful 86 y.o. male who has been referred to [place] by Dr [name] for evaluation and management of Follicular Lymphoma. He is accompanied today by his sister. The pt reports that he is doing well overall.   The pt reports first noticing the rt neck mass develop in his neck about a year ago and saw his PCP Dr [name].  The mass was getting larger and so he was referred to Dr [name] for further evaluation and had a biopsy of the rt upper neck mass which showed Grade 1-2 follicular lymphoma. pt has had Tissue flow cytometry completed on 08/05/17 with results revealing A LAMBDA-RESTRICTED MONOCLONAL B-CELL POPULATION EXPRESSING CD10 COMPRISES 39% OF ALL LYMPHOCYTES.  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 He denies feeling any differently now than he did a year ago. He takes daily 81mg  aspirin which he notes has led to some mild increased bruising.   Of note prior to the patient's visit today,  He notes that he has had a couple spots of melanoma on his chin and back about 5 and 8 years ago. These were subsequently removed. He has had a basal cell as well and has been treated several times with liquid nitrogen.   On review of systems, pt reports stable energy levels, some bruising, stable bowel habits, and denies fevers, chills, night sweats, unexpected weight loss, pain along the spine, abdominal pains, testicular pain or swelling, and any other symptoms.   On PMHx the pt reports melanoma, CAD,  HTN, HLD, cataract in left eye.   INTERVAL HISTORY:   Sayeed Weatherall Heiberger returns today for management and evaluation of his follicular lymphoma and his second of four doses of weekly Rituxan. The patient's last visit with [place] was on 04/17/2020. The pt reports that he is doing well overall.  The pt reports that he has been tolerating the weekly Rituxan well. He notes that he had a fall last week in Infusion trying to go to the bathroom, but notes no issues or concerns related to this. This could have been due to the Tylenol/Benadryl premedications or a low blood pressure the pt notes. The pt reports that his fatigue and leg swelling is still stable from last visit.   The pt notes that his PCP told him his blood pressure was low. The pt notes they are making changes in reducing his medication dosage. The pt also notes that he is seeing his doctor regarding leg swelling in the next week. The pt wears his compression socks daily. The pt notes that the lymph nodes in his neck have shrunk in size following starting Rituxan.  The pt also notes that he has been experiencing trouble with finding a dentist covered by his insurance. He wishes to get the caps and fillers fixed that are not as they used to be.   Lab results today 04/30/2020 of CBC w/diff and CMP is as follows: all values are WNL except for Hgb of 12.7, AST of 14.  On review of systems, pt reports  improving leg swelling, fatigue and denies abdominal pain, decreased appetite, fevers, chills, back pain,  and any other symptoms.  MEDICAL HISTORY:  Past Medical History:  Diagnosis Date  . BPH (benign prostatic hypertrophy)   . CAD (coronary artery disease) 01/2010   S/P drug eluting stents LAD and PL branch of RCA Dr. Olevia Perches  . follicular lymphoma dx'd 06/2017  . Hernia   . HLD (hyperlipidemia)   . HTN (hypertension)     SURGICAL HISTORY: Past Surgical History:  Procedure Laterality Date  . BACK SURGERY    . CYSTOSCOPY WITH LITHOLAPAXY N/A  05/20/2019   Procedure: CYSTOSCOPY WITH LITHOLAPAXY;  Surgeon: Ardis Hughs, MD;  Location: [facility];  Service: Urology;  Laterality: N/A;  . CYSTOSCOPY/URETEROSCOPY/HOLMIUM LASER/STENT PLACEMENT Left 05/20/2019   Procedure: CYSTOSCOPY/URETEROSCOPY/HOLMIUM LASER/STENT PLACEMENT;  Surgeon: Ardis Hughs, MD;  Location: [facility];  Service: Urology;  Laterality: Left;  . HERNIA REPAIR  01/08/11   [facility]  . MASS EXCISION Right 08/05/2017   Procedure: EXCISION RIGHT POSTERIOR  NECK LYMPH NODE;  Surgeon: Rozetta Nunnery, MD;  Location: [facility];  Service: ENT;  Laterality: Right;    SOCIAL HISTORY: Social History   Socioeconomic History  . Marital status: Married    Spouse name: Not on file  . Number of children: Not on file  . Years of education: Not on file  . Highest education level: Not on file  Occupational History  . Not on file  Tobacco Use  . Smoking status: Never Smoker  . Smokeless tobacco: Never Used  Vaping Use  . Vaping Use: Never used  Substance and Sexual Activity  . Alcohol use: No  . Drug use: No  . Sexual activity: Not on file  Other Topics Concern  . Not on file  Social History Narrative   Married   Social Determinants of Health   Financial Resource Strain: Not on file  Food Insecurity: Not on file  Transportation Needs: Not on file  Physical Activity: Not on file  Stress: Not on file  Social Connections: Not on file  Intimate Partner Violence: Not on file    FAMILY HISTORY: Family History  Problem Relation Age of Onset  . Heart failure Mother     ALLERGIES:  has No Known Allergies.  MEDICATIONS:  Current Outpatient Medications  Medication Sig Dispense Refill  . allopurinol (ZYLOPRIM) 100 MG tablet Take 1 tablet (100 mg total) by mouth 2 (two) times daily. (Patient not taking: Reported on 05/12/2019) 30 tablet 0  . aspirin 81 MG tablet Take 81 mg by mouth daily.      [name] atenolol (TENORMIN) 25 MG tablet Take 1 tablet (25 mg  total) by mouth daily. 90 tablet 3  . brimonidine (ALPHAGAN) 0.2 % ophthalmic solution Place 1 drop into the right eye 2 (two) times daily.      . cholecalciferol (VITAMIN D) 1000 UNITS tablet Take 1,000 Units by mouth daily.      . Cyanocobalamin (B-12) 2500 MCG TABS Take 2,500 mcg by mouth daily.    . finasteride (PROSCAR) 5 MG tablet Take 5 mg by mouth daily.      . furosemide (LASIX) 20 MG tablet Take 1 tablet (20 mg total) by mouth daily. 90 tablet 3  . gabapentin (NEURONTIN) 100 MG capsule Take 100 mg by mouth daily as needed (knee pain).     . isosorbide dinitrate (ISORDIL) 5 MG tablet Take 1 tablet (5 mg total) by mouth 2 (two) times daily.  180 tablet 3  . lisinopril (PRINIVIL,ZESTRIL) 10 MG tablet Take 1 tablet (10 mg total) by mouth daily. 90 tablet 3  . nitroGLYCERIN (NITROSTAT) 0.4 MG SL tablet Place 1 tablet (0.4 mg total) under the tongue every 5 (five) minutes as needed for chest pain. 25 tablet 6  . ondansetron (ZOFRAN) 8 MG tablet Take 1 tablet (8 mg total) by mouth every 8 (eight) hours as needed for nausea. (Patient not taking: Reported on 05/12/2019) 30 tablet 0  . phenazopyridine (PYRIDIUM) 200 MG tablet Take 1 tablet (200 mg total) by mouth 3 (three) times daily as needed for pain. (Patient not taking: Reported on 07/02/2019) 10 tablet 0  . pravastatin (PRAVACHOL) 40 MG tablet Take 1 tablet (40 mg total) by mouth at bedtime. (Patient taking differently: Take 40 mg by mouth every Monday, Wednesday, and Friday. ) 90 tablet 3  . timolol (TIMOPTIC) 0.5 % ophthalmic solution Place 1 drop into the right eye daily.      . traMADol (ULTRAM) 50 MG tablet Take 1-2 tablets (50-100 mg total) by mouth every 6 (six) hours as needed for moderate pain. 15 tablet 0   No current facility-administered medications for this visit.    REVIEW OF SYSTEMS:   10 Point review of Systems was done is negative except as noted above.  PHYSICAL EXAMINATION: ECOG FS:2 - Symptomatic, <50% confined to  bed  Vitals:   05/01/20 0915  BP: 132/68  Pulse: 82  Resp: 18  Temp: (!) 97.3 F (36.3 C)  SpO2: 100%   Wt Readings from Last 3 Encounters:  05/01/20 161 lb 4.8 oz (73.2 kg)  04/24/20 166 lb 4 oz (75.4 kg)  04/17/20 162 lb 8 oz (73.7 kg)   Body mass index is 23.82 kg/m.    Exam was given in a chair.  GENERAL:alert, in no acute distress and comfortable SKIN: no acute rashes, no significant lesions EYES: conjunctiva are pink and non-injected, sclera anicteric OROPHARYNX: MMM, no exudates, no oropharyngeal erythema or ulceration NECK: supple, no JVD LYMPH:  no palpable lymphadenopathy in the cervical, axillary or inguinal regions LUNGS: clear to auscultation b/l with normal respiratory effort HEART: regular rate & rhythm ABDOMEN:  normoactive bowel sounds , non tender, not distended. Extremity: no pedal edema. 1+ leg swelling b/l PSYCH: alert & oriented x 3 with fluent speech NEURO: no focal motor/sensory deficits  LABORATORY DATA:  I have reviewed the data as listed  CBC Latest Ref Rng & Units 05/01/2020 04/24/2020 03/21/2020  WBC 4.0 - 10.5 K/uL 8.8 8.2 8.4  Hemoglobin 13.0 - 17.0 g/dL 12.7(L) 12.2(L) 11.8(L)  Hematocrit 39.0 - 52.0 % 39.6 38.5(L) 36.2(L)  Platelets 150 - 400 K/uL 292 274 239    CMP Latest Ref Rng & Units 05/01/2020 04/24/2020 03/21/2020  Glucose 70 - 99 mg/dL 94 96 83  BUN 8 - 23 mg/dL 19 21 22   Creatinine 0.61 - 1.24 mg/dL 0.89 0.83 1.00  Sodium 135 - 145 mmol/L 140 142 140  Potassium 3.5 - 5.1 mmol/L 3.8 3.9 3.9  Chloride 98 - 111 mmol/L 105 104 104  CO2 22 - 32 mmol/L 27 28 28   Calcium 8.9 - 10.3 mg/dL 9.5 9.5 9.4  Total Protein 6.5 - 8.1 g/dL 7.1 6.8 6.7  Total Bilirubin 0.3 - 1.2 mg/dL 1.0 0.8 0.7  Alkaline Phos 38 - 126 U/L 73 68 63  AST 15 - 41 U/L 14(L) 13(L) 12(L)  ALT 0 - 44 U/L 11 7 8   uric acid 6.4  Lab Results  Component Value Date   LDH 154 03/21/2020     02/18/2019 CT Abdomen Pelvis W Contrast (Accession  [id number]):   02/18/2019 CT Chest W Contrast (Accession [id number]):  08/08/17 Pathology:   08/05/17 Pathology:   RADIOGRAPHIC STUDIES: I have personally reviewed the radiological images as listed and agreed with the findings in the report. CT Soft Tissue Neck W Contrast  Result Date: 04/13/2020 CLINICAL DATA:  Follicular lymphoma grade 1, lymph nodes of multiple sites. Hematologic malignancy, surveillance; patient with clinical progression of non-Hodgkin's lymphoma for pretreatment restaging. EXAM: CT NECK WITH CONTRAST TECHNIQUE: Multidetector CT imaging of the neck was performed using the standard protocol following the bolus administration of intravenous contrast. CONTRAST:  174mL OMNIPAQUE IOHEXOL 300 MG/ML  SOLN COMPARISON:  Neck CT 11/03/2018. Same day CT chest/abdomen/pelvis 04/13/2020. FINDINGS: Pharynx and larynx: Streak artifact from dental restoration partially obscures the oral cavity. No appreciable swelling or discrete mass within the oral cavity, pharynx or larynx. Salivary glands: No inflammation, mass, or stone. Persistent intraparotid lymphadenopathy. Thyroid: Unremarkable. Lymph nodes: Persistent lymphadenopathy throughout the bilateral neck. There has been a mixed response to treatment when comparing with the prior CT neck of 11/03/2018 with some nodes having decreased in size and some nodes having increased in size in the interim. For instance, a previous index large node/nodal conglomerate at the right level 5 station has decreased in size now measuring 2.8 x 1.6 cm (series 12, image 20) (previously 3.9 x 2.6 cm). However, a left level 1 lymph node has increased in size, now measuring 2.0 x 1.4 cm (previously 1.5 x 0.9 cm). Additionally, a previous index node within the left lower neck immediately lateral to the left common carotid artery has decreased in size (now measuring 1 cm in short axis (previously 1.8 cm). Vascular: The major vascular structures of the neck are patent.  Atherosclerotic plaque within the visualized aortic arch, the major branch vessels of the neck and carotid bifurcations/ICAs. Limited intracranial: No acute intracranial abnormality identified. Visualized orbits: Incompletely imaged. No mass or acute finding at the imaged levels. Mastoids and visualized paranasal sinuses: No significant paranasal sinus disease or mastoid effusion at the imaged levels. Skeleton: No acute bony abnormality or aggressive osseous lesion. Unchanged cervical spondylosis. Upper chest: Separately reported. IMPRESSION: Persistent lymphadenopathy throughout the bilateral neck. As compared to the prior neck CT of 11/03/2018, there has been a mixed response to treatment with some nodes having increased in size, and some nodes having decreased in size, in the interim. Please see nodal measurements provided. Please refer to same-day CT thorax for description of intrathoracic findings. Electronically Signed   By: Kellie Simmering DO   On: 04/13/2020 13:59   CT CHEST ABDOMEN PELVIS W CONTRAST  Result Date: 04/13/2020 CLINICAL DATA:  Hematologic malignancy, surveillance with clinical progression of follicular lymphoma for pre treatment restaging. EXAM: CT CHEST, ABDOMEN, AND PELVIS WITH CONTRAST TECHNIQUE: Multidetector CT imaging of the chest, abdomen and pelvis was performed following the standard protocol during bolus administration of intravenous contrast. CONTRAST:  141mL OMNIPAQUE IOHEXOL 300 MG/ML  SOLN COMPARISON:  CT abdomen and pelvis October 01, 2019 and CT chest abdomen pelvis February 18, 2019 FINDINGS: CT CHEST FINDINGS Cardiovascular: Normal size heart. No pericardial effusion. Three-vessel coronary artery disease. Aortic atherosclerosis. Mediastinum/Nodes: No mediastinal or hilar adenopathy. Increased axillary, subpectoral and supraclavicular lymphadenopathy. Previously indexed lesions are as follows. Dominant left axillary lymph node now measures 4.7 x 2.4 cm previously 2.4 x 0.8 cm  (series 2, image 13).  Right axillary lymph node now measures 5.1 x 1.9 cm previously 2.1 x 0.6 cm (series 2, image 21). Lungs/Pleura: No suspicious pulmonary nodules or masses. No focal consolidation. No pleural effusion. Musculoskeletal: No suspicious lytic or blastic lesion of bone. Multilevel degenerative changes spine. CT ABDOMEN PELVIS FINDINGS Hepatobiliary: No focal liver abnormality is seen. No gallstones, gallbladder wall thickening, or biliary dilatation. Pancreas: Diffuse pancreatic calcifications compatible with chronic pancreatitis. No dilation of the main pancreatic duct. And no evidence of acute inflammation. Spleen: Normal in size without focal abnormality. Adrenals/Urinary Tract: Adrenal nodules or masses. No hydronephrosis. No suspicious renal masses. Tiny bladder calculi versus bladder wall calcifications layering along the dependent portion of the urinary bladder. Stomach/Bowel: Stomach is within normal limits. Colonic diverticulosis. No evidence of bowel wall thickening, distention, or inflammatory changes. Vascular/Lymphatic: Increased size of the index and non index abdominopelvic lymph nodes. Index lymph nodes are as follows: Right inguinal lymph node measures 4.5 x 4.1 cm previously 4.1 x 3.1 cm (series 2, image 121). Aortocaval lymph node measures 1.9 x 1.0 cm previously 0.9 x 0.8 cm (series 2, image 75). Reproductive: Prostatic calcifications. Other: No abdominopelvic ascites. Musculoskeletal: Multilevel degenerative change of the spine and multifocal degenerative joint disease. No suspicious lytic or blastic lesion of bone. IMPRESSION: 1. Increased size of the index and non index thoracic and abdominopelvic lymph nodes, consistent with progression of disease. 2. Stigmata of chronic pancreatitis without findings to suggest acute inflammation. 3. Unchanged tiny bladder calculi versus bladder wall calcifications layering along the dependent portion of the urinary bladder. 4. Aortic  atherosclerosis. Aortic Atherosclerosis (ICD10-I70.0). Electronically Signed   By: Dahlia Bailiff MD   On: 04/13/2020 14:02    ASSESSMENT & PLAN:   86 y.o. male with  1.h/o  atleast Stage III Follicular Lymphoma Grade 1-2 presenting with rt cervical lymphadenopathy  08/05/17 pathology results of right cervical LN revealing low grade follicular lymphoma  5/63/89 PET revealed Hypermetabolic lymphadenopathy involving the right neck, bilateral axillary, right hilar and right inguinal lymph nodes. No abdominal or pelvic involvement.    03/20/18 CT C/A/P revealed Progressive bilateral axillary and right inguinal lymphadenopathy, including necrotic lymph nodes in the right axilla, as above. Spleen is normal in size. 4 mm distal left ureteral calculus, unchanged. No hydronephrosis. Stable layering bladder calculi measuring up to 5 mm. Cholelithiasis, without associated inflammatory changes. Stable sequela of prior/chronic pancreatitis.   11/03/2018 CT neck revealed "Marked interval progression of cervical lymphadenopathy since PET-CT 09/02/2017, now with lymphadenopathy present bilaterally at essentially all stations. Index nodes as described. This includes bilateral intraparotid lymphadenopathy. Bulky right-sided adenopathy results in multifocal narrowing of the right internal jugular vein. Significant narrowing of the mid to lower left internal jugular vein of unclear cause as there is no significant mass effect at this level."  11/03/2018 CT CAP revealed "1. Progression of bilateral axillary, retroperitoneal, pelvic, and inguinal adenopathy. New anterior and posterior lower cervical adenopathy identified within the neck. 2. Persistent distal left ureteral calculus. Small bladder calculi are unchanged. 3. Aortic Atherosclerosis (ICD10-I70.0). Coronary artery calcifications. 4. Sequelae of chronic pancreatitis noted."  10/01/2019 CT Abd/Pel ([id number]) revealed no evidence of mass in right kidney, new and  enlarged abdominal and pelvic lymph nodes.   2. 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 3. H/o head and neck Basal cell carcinoma Continue routine skin checks with dermatologist    4. Renal indeterminate lesion -06/28/19 Renal US ([id number]) revealed New right-sided mass at the hilum which measures 1.5cm in size.   -  resolved on subsequent scans  5. Urinary stones in ureter and bladder.  PLAN: -Discussed pt labwork today, 04/30/2020; blood counts and chemistries normal. -The pt has no prohibitive toxicities from continuing Rituxan at this time. -Recommended pt continue to wear compression socks. -Recommended pt continue to eat well and drink 48-64 oz water daily.  -Advised pt we will decide maintenance treatment following 4 cycles of Rituxan. Will discuss later regarding this matter. -Will give referal to dentist here. The pt wishes to pursue this. -Continue Multivitamin, Vitamin D, and B12 daily.  -Advise pt to increase Lasix to 40 mg daily, one in morning and one at lunch time. -Will see back in 2 weeks with labs.   FOLLOW UP:  -Refer to [facility] for dental pain -f/u for next 2 cycles of Rituxan with currently scheduled appointments. MD visit in 2 weeks  The total time spent in the appointment was 20 minutes and more than 50% was on counseling and direct patient cares.   All of the patient's questions were answered with apparent satisfaction. The patient knows to call the clinic with any problems, questions or concerns.   Sullivan Lone MD [facility] Hematology/Oncology Physician [facility]  (Office):       [phone] (Work cell):  [phone] (Fax):           [phone]  05/01/2020 9:53 AM  I, Reinaldo Raddle, am acting as scribe for Dr. Sullivan Lone, MD.     .I have reviewed the above documentation for accuracy and completeness, and I agree with the above. Brunetta Genera MD

## 2020-05-01 ENCOUNTER — Other Ambulatory Visit: Payer: Self-pay

## 2020-05-01 ENCOUNTER — Inpatient Hospital Stay: Payer: Medicare Other

## 2020-05-01 ENCOUNTER — Inpatient Hospital Stay: Payer: Medicare Other | Admitting: Hematology

## 2020-05-01 VITALS — BP 132/68 | HR 82 | Temp 97.3°F | Resp 18 | Ht 69.0 in | Wt 161.3 lb

## 2020-05-01 VITALS — BP 115/53 | HR 62 | Temp 98.0°F | Resp 16

## 2020-05-01 DIAGNOSIS — Z7189 Other specified counseling: Secondary | ICD-10-CM

## 2020-05-01 DIAGNOSIS — C8291 Follicular lymphoma, unspecified, lymph nodes of head, face, and neck: Secondary | ICD-10-CM

## 2020-05-01 DIAGNOSIS — Z5112 Encounter for antineoplastic immunotherapy: Secondary | ICD-10-CM | POA: Diagnosis not present

## 2020-05-01 DIAGNOSIS — K029 Dental caries, unspecified: Secondary | ICD-10-CM | POA: Diagnosis not present

## 2020-05-01 DIAGNOSIS — C8208 Follicular lymphoma grade I, lymph nodes of multiple sites: Secondary | ICD-10-CM

## 2020-05-01 DIAGNOSIS — Z5111 Encounter for antineoplastic chemotherapy: Secondary | ICD-10-CM | POA: Diagnosis not present

## 2020-05-01 LAB — CBC WITH DIFFERENTIAL/PLATELET
Abs Immature Granulocytes: 0.04 10*3/uL (ref 0.00–0.07)
Basophils Absolute: 0 10*3/uL (ref 0.0–0.1)
Basophils Relative: 0 %
Eosinophils Absolute: 0.1 10*3/uL (ref 0.0–0.5)
Eosinophils Relative: 1 %
HCT: 39.6 % (ref 39.0–52.0)
Hemoglobin: 12.7 g/dL — ABNORMAL LOW (ref 13.0–17.0)
Immature Granulocytes: 1 %
Lymphocytes Relative: 13 %
Lymphs Abs: 1.2 10*3/uL (ref 0.7–4.0)
MCH: 27.8 pg (ref 26.0–34.0)
MCHC: 32.1 g/dL (ref 30.0–36.0)
MCV: 86.7 fL (ref 80.0–100.0)
Monocytes Absolute: 0.8 10*3/uL (ref 0.1–1.0)
Monocytes Relative: 9 %
Neutro Abs: 6.7 10*3/uL (ref 1.7–7.7)
Neutrophils Relative %: 76 %
Platelets: 292 10*3/uL (ref 150–400)
RBC: 4.57 MIL/uL (ref 4.22–5.81)
RDW: 13.4 % (ref 11.5–15.5)
WBC: 8.8 10*3/uL (ref 4.0–10.5)
nRBC: 0 % (ref 0.0–0.2)

## 2020-05-01 LAB — CMP (CANCER CENTER ONLY)
ALT: 11 U/L (ref 0–44)
AST: 14 U/L — ABNORMAL LOW (ref 15–41)
Albumin: 4.1 g/dL (ref 3.5–5.0)
Alkaline Phosphatase: 73 U/L (ref 38–126)
Anion gap: 8 (ref 5–15)
BUN: 19 mg/dL (ref 8–23)
CO2: 27 mmol/L (ref 22–32)
Calcium: 9.5 mg/dL (ref 8.9–10.3)
Chloride: 105 mmol/L (ref 98–111)
Creatinine: 0.89 mg/dL (ref 0.61–1.24)
GFR, Estimated: >60 mL/min (ref >60)
Glucose, Bld: 94 mg/dL (ref 70–99)
Potassium: 3.8 mmol/L (ref 3.5–5.1)
Sodium: 140 mmol/L (ref 135–145)
Total Bilirubin: 1 mg/dL (ref 0.3–1.2)
Total Protein: 7.1 g/dL (ref 6.5–8.1)

## 2020-05-01 MED ORDER — FAMOTIDINE IN NACL 20-0.9 MG/50ML-% IV SOLN
20.0000 mg | Freq: Once | INTRAVENOUS | Status: AC
  Administered 2020-05-01: 20 mg via INTRAVENOUS

## 2020-05-01 MED ORDER — SODIUM CHLORIDE 0.9 % IV SOLN
375.0000 mg/m2 | Freq: Once | INTRAVENOUS | Status: AC
  Administered 2020-05-01: 700 mg via INTRAVENOUS
  Filled 2020-05-01: qty 50

## 2020-05-01 MED ORDER — ACETAMINOPHEN 325 MG PO TABS
650.0000 mg | ORAL_TABLET | Freq: Once | ORAL | Status: AC
  Administered 2020-05-01: 650 mg via ORAL

## 2020-05-01 MED ORDER — SODIUM CHLORIDE 0.9 % IV SOLN
Freq: Once | INTRAVENOUS | Status: AC
  Filled 2020-05-01: qty 250

## 2020-05-01 MED ORDER — METHYLPREDNISOLONE SODIUM SUCC 125 MG IJ SOLR
125.0000 mg | Freq: Once | INTRAMUSCULAR | Status: AC
  Administered 2020-05-01: 125 mg via INTRAVENOUS

## 2020-05-01 MED ORDER — DIPHENHYDRAMINE HCL 25 MG PO CAPS
50.0000 mg | ORAL_CAPSULE | Freq: Once | ORAL | Status: AC
  Administered 2020-05-01: 50 mg via ORAL

## 2020-05-01 MED ORDER — SODIUM CHLORIDE 0.9 % IV SOLN: 375.0000 mg/m2 | Freq: Once | INTRAVENOUS | Status: DC

## 2020-05-01 MED ORDER — METHYLPREDNISOLONE SODIUM SUCC 125 MG IJ SOLR
INTRAMUSCULAR | Status: AC
  Filled 2020-05-01: qty 2

## 2020-05-01 MED ORDER — DIPHENHYDRAMINE HCL 25 MG PO CAPS
ORAL_CAPSULE | ORAL | Status: AC
  Filled 2020-05-01: qty 2

## 2020-05-01 MED ORDER — FAMOTIDINE IN NACL 20-0.9 MG/50ML-% IV SOLN
INTRAVENOUS | Status: AC
  Filled 2020-05-01: qty 50

## 2020-05-01 MED ORDER — DIPHENHYDRAMINE HCL 25 MG PO CAPS
ORAL_CAPSULE | ORAL | Status: AC
  Filled 2020-05-01: qty 1

## 2020-05-01 MED ORDER — ACETAMINOPHEN 325 MG PO TABS
ORAL_TABLET | ORAL | Status: AC
  Filled 2020-05-01: qty 2

## 2020-05-01 NOTE — Progress Notes (Signed)
Rapid Infusion Rituximab Pharmacist Evaluation  Harold [name] is a 86 y.o. male being treated with rituximab for [place]. This patient may be considered for RIR.   A pharmacist has verified the patient tolerated rituximab infusions per the [facility] standard infusion protocol without grade 3-4 infusion reactions. The treatment plan will be updated to reflect RIR if the patient qualifies per the checklist below:   Age > 30 years old Yes   Clinically significant cardiovascular disease No   Circulating lymphocyte count < 5000/uL prior to cycle two Yes  Lab Results  Component Value Date   LYMPHSABS 1.2 05/01/2020    Prior documented grade 3-4 infusion reaction to rituximab No   Prior documented grade 1-2 infusion reaction to rituximab (If YES, Pharmacist will confirm with Physician if patient is still a candidate for RIR) No   Previous rituximab infusion within the past 6 months Yes   Treatment Plan updated orders to reflect RIR Yes    Florene Route Stabenow does meet the criteria for Rapid Infusion Rituximab. This patient is going to be switched to rapid infusion rituximab.   [name], PharmD 05/01/20 11:04 AM

## 2020-05-04 ENCOUNTER — Ambulatory Visit: Payer: Medicare Other | Admitting: Cardiovascular Disease

## 2020-05-04 ENCOUNTER — Encounter: Payer: Self-pay | Admitting: Cardiovascular Disease

## 2020-05-04 ENCOUNTER — Other Ambulatory Visit: Payer: Self-pay

## 2020-05-04 VITALS — BP 126/68 | HR 78 | Ht 69.0 in | Wt 159.4 lb

## 2020-05-04 DIAGNOSIS — I10 Essential (primary) hypertension: Secondary | ICD-10-CM | POA: Diagnosis not present

## 2020-05-04 DIAGNOSIS — I5033 Acute on chronic diastolic (congestive) heart failure: Secondary | ICD-10-CM

## 2020-05-04 DIAGNOSIS — I2510 Atherosclerotic heart disease of native coronary artery without angina pectoris: Secondary | ICD-10-CM

## 2020-05-04 DIAGNOSIS — E7800 Pure hypercholesterolemia, unspecified: Secondary | ICD-10-CM | POA: Diagnosis not present

## 2020-05-04 DIAGNOSIS — M7989 Other specified soft tissue disorders: Secondary | ICD-10-CM | POA: Diagnosis not present

## 2020-05-04 MED ORDER — POTASSIUM CHLORIDE CRYS ER 20 MEQ PO TBCR: 20.0000 meq | EXTENDED_RELEASE_TABLET | Freq: Every day | ORAL | 3 refills | Status: DC

## 2020-05-04 NOTE — Progress Notes (Signed)
Chief Complaint  Patient presents with  . Follow-up    CAD    History of Present Illness: 86 yo male with history of CAD, HTN, follicular lymphoma and HLD here today for cardiac follow up. He underwent catheterization in October 2011 because of chest pain and was found to have a severe stenosis in a large posterolateral branch of the right coronary artery and a severe stenosis in the proximal LAD with complete occlusion of the distal LAD. Drug eluting stents were placed in the posterior lateral branch of the right coronary and the mid LAD in October 2011. Exercise stress test 10/04/11 with no evidence of ischemia. He was diagnosed with lymphoma in March 2019. This was found when he had a mass on his neck. This was treated with resection. He has been treated with steroids short term for pain from his right axillary nodes. He completed chemotherapy but had recurrence and is back on chemotherapy. Echo January 2020 with LVEF=60-65%. No significant valve disease.   He is here today for follow up. The patient denies any chest pain, dyspnea, palpitations, orthopnea, PND, dizziness, near syncope or syncope. He has some left arm pain. He has swelling of both legs for several months. He has discussed this in primary care and oncology. Left leg is more swollen than the right leg.    Primary Care Physician: [name], MD  Past Medical History:  Diagnosis Date  . BPH (benign prostatic hypertrophy)   . CAD (coronary artery disease) 01/2010   S/P drug eluting stents LAD and PL branch of RCA Dr. Olevia Perches  . follicular lymphoma dx'd 06/2017  . Hernia   . HLD (hyperlipidemia)   . HTN (hypertension)     Past Surgical History:  Procedure Laterality Date  . BACK SURGERY    . CYSTOSCOPY WITH LITHOLAPAXY N/A 05/20/2019   Procedure: CYSTOSCOPY WITH LITHOLAPAXY;  Surgeon: Ardis Hughs, MD;  Location: [facility];  Service: Urology;  Laterality: N/A;  . CYSTOSCOPY/URETEROSCOPY/HOLMIUM LASER/STENT PLACEMENT  Left 05/20/2019   Procedure: CYSTOSCOPY/URETEROSCOPY/HOLMIUM LASER/STENT PLACEMENT;  Surgeon: Ardis Hughs, MD;  Location: [facility];  Service: Urology;  Laterality: Left;  . HERNIA REPAIR  01/08/11   RIH  . MASS EXCISION Right 08/05/2017   Procedure: EXCISION RIGHT POSTERIOR  NECK LYMPH NODE;  Surgeon: Rozetta Nunnery, MD;  Location: [facility];  Service: ENT;  Laterality: Right;    Current Outpatient Medications  Medication Sig Dispense Refill  . aspirin 81 MG tablet Take 81 mg by mouth daily.    [name] atenolol (TENORMIN) 25 MG tablet Take 1 tablet (25 mg total) by mouth daily. 90 tablet 3  . brimonidine (ALPHAGAN) 0.2 % ophthalmic solution Place 1 drop into the right eye 2 (two) times daily.    . cholecalciferol (VITAMIN D) 1000 UNITS tablet Take 1,000 Units by mouth daily.    . Cyanocobalamin (B-12) 2500 MCG TABS Take 2,500 mcg by mouth daily.    . finasteride (PROSCAR) 5 MG tablet Take 5 mg by mouth daily.    . furosemide (LASIX) 20 MG tablet Take 1 tablet (20 mg total) by mouth daily. 90 tablet 3  . gabapentin (NEURONTIN) 100 MG capsule Take 100 mg by mouth daily as needed (knee pain).     . isosorbide dinitrate (ISORDIL) 5 MG tablet Take 1 tablet (5 mg total) by mouth 2 (two) times daily. 180 tablet 3  . lisinopril (ZESTRIL) 5 MG tablet Take 5 mg by mouth daily.    [name]  ondansetron (ZOFRAN) 8 MG tablet Take 1 tablet (8 mg total) by mouth every 8 (eight) hours as needed for nausea. 30 tablet 0  . potassium chloride SA (KLOR-CON) 20 MEQ tablet Take 1 tablet (20 mEq total) by mouth daily. 90 tablet 3  . pravastatin (PRAVACHOL) 40 MG tablet Take 1 tablet (40 mg total) by mouth at bedtime. (Patient taking differently: Take 40 mg by mouth every Monday, Wednesday, and Friday.) 90 tablet 3  . timolol (TIMOPTIC) 0.5 % ophthalmic solution Place 1 drop into the right eye daily.    [name] allopurinol (ZYLOPRIM) 100 MG tablet Take 1 tablet (100 mg total) by mouth 2 (two) times daily.  (Patient not taking: No sig reported) 30 tablet 0  . nitroGLYCERIN (NITROSTAT) 0.4 MG SL tablet Place 1 tablet (0.4 mg total) under the tongue every 5 (five) minutes as needed for chest pain. 25 tablet 6   No current facility-administered medications for this visit.    No Known Allergies  Social History   Socioeconomic History  . Marital status: Married    Spouse name: Not on file  . Number of children: Not on file  . Years of education: Not on file  . Highest education level: Not on file  Occupational History  . Not on file  Tobacco Use  . Smoking status: Never Smoker  . Smokeless tobacco: Never Used  Vaping Use  . Vaping Use: Never used  Substance and Sexual Activity  . Alcohol use: No  . Drug use: No  . Sexual activity: Not on file  Other Topics Concern  . Not on file  Social History Narrative   Married   Social Determinants of Health   Financial Resource Strain: Not on file  Food Insecurity: Not on file  Transportation Needs: Not on file  Physical Activity: Not on file  Stress: Not on file  Social Connections: Not on file  Intimate Partner Violence: Not on file    Family History  Problem Relation Age of Onset  . Heart failure Mother     Review of Systems:  As stated in the HPI and otherwise negative.   BP 126/68   Pulse 78   Ht 5\' 9"  (1.753 m)   Wt 159 lb 6.4 oz (72.3 kg)   SpO2 99%   BMI 23.54 kg/m   Physical Examination:  General: Well developed, well nourished, NAD  HEENT: OP clear, mucus membranes moist  SKIN: warm, dry. No rashes. Neuro: No focal deficits  Musculoskeletal: Muscle strength 5/5 all ext  Psychiatric: Mood and affect normal  Neck: No JVD, no carotid bruits, no thyromegaly, no lymphadenopathy.  Lungs:Clear bilaterally, no wheezes, rhonci, crackles Cardiovascular: Regular rate and rhythm. No murmurs, gallops or rubs. Abdomen:Soft. Bowel sounds present. Non-tender.  Extremities: Trace edema right LE. 1+ edema left LE.   EKG:   EKG is ordered today. The ekg ordered today demonstrates sinus,rate 78 bpm  Echo January 2020: 1. The left ventricle has normal systolic function of 98-11%. The cavity  size is normal. There is no left ventricular wall thickness. Echo evidence  of impaired relaxation diastolic filling patterns.  2. Mildly dilated left atrial size.  3. Normal right atrial size.  4. The mitral valve is degenerative. There is moderate thickening and  moderately calcified. Regurgitation is not visualized by color flow  Doppler.  5. Normal tricuspid valve.  6. The aortic valve tricuspid. There is moderate thickening and moderate  calcification of the aortic valve.  7. No atrial level  shunt detected by color flow Doppler.   Recent Labs: 04/24/2020: Magnesium 1.8 05/01/2020: ALT 11; BUN 19; Creatinine 0.89; Hemoglobin 12.7; Platelets 292; Potassium 3.8; Sodium 140   Lipid Panel No results found for: CHOL, TRIG, HDL, CHOLHDL, VLDL, LDLCALC, LDLDIRECT   Wt Readings from Last 3 Encounters:  05/04/20 159 lb 6.4 oz (72.3 kg)  05/01/20 161 lb 4.8 oz (73.2 kg)  04/24/20 166 lb 4 oz (75.4 kg)     Other studies Reviewed: Additional studies/ records that were reviewed today include:  Review of the above records demonstrates:    Assessment and Plan:   1. CAD without angina: No chest pain. Echo January 2020 with normal LV systolic function. Continue ASA, statin, beta blocker and Imdur. Of note, he was a non-responder to Plavix.   2. HTN: BP is well controlled. No changes today  3. Hyperlipidemia: Lipids followed in primary care. Continue statin  4. Acute on Chronic diastolic CHF: Weight is stable but he has bilateral LE edema. His left leg is more swollen than his right leg. He has discussed this with primary care and oncology recently. Will increase Lasix to 40 mg po daily for one week then 20 mg per day. Will add KDur 20 meq daily. Will arrange LLE venous dopplers to exclude DVT.     Current medicines  are reviewed at length with the patient today.  The patient does not have concerns regarding medicines.  The following changes have been made:  no change  Labs/ tests ordered today include:   Orders Placed This Encounter  Procedures  . EKG 12-Lead  . VAS [place] LOWER EXTREMITY VENOUS (DVT)    Disposition:   FU with me in 4-6 weeks  Signed, Lauree Chandler, MD 05/04/2020 2:13 PM    [facility] [address], [place], [place]  [address] Phone: [phone]; Fax: [phone]

## 2020-05-04 NOTE — Patient Instructions (Addendum)
Medication Instructions:  Your physician has recommended you make the following change in your medication:  1.) increase Lasix (furosemide) to 2 tablets (40 mg) every morning for one week and then return to your usual dose of one tablet daily 2.) start potassium 20 meq - take one tablet daily  *If you need a refill on your cardiac medications before your next appointment, please call your pharmacy*   Lab Work: none  Testing/Procedures: Your physician has requested that you have a lower  extremity venous duplex. This test is an ultrasound of the veins in the legs. It looks at venous blood flow that carries blood from the heart to the legs. Allow one hour for a Lower Venous exam. There are no restrictions or special instructions.   Follow-Up: At [facility], you and your health needs are our priority.  As part of our continuing mission to provide you with exceptional heart care, we have created designated Provider Care Teams.  These Care Teams include your primary Cardiologist (physician) and Advanced Practice Providers (APPs -  Physician Assistants and Nurse Practitioners) who all work together to provide you with the care you need, when you need it.  We recommend signing up for the patient portal called "MyChart".  Sign up information is provided on this After Visit Summary.  MyChart is used to connect with patients for Virtual Visits (Telemedicine).  Patients are able to view lab/test results, encounter notes, upcoming appointments, etc.  Non-urgent messages can be sent to your provider as well.   To learn more about what you can do with MyChart, go to [email or web address].    Your next appointment:   4-6 weeks   -OK TO USE NP SPOT AT 8:40 AM ON 3/21  The format for your next appointment:   In Person  Provider:   You may see Lauree Chandler, MD or one of the following Advanced Practice Providers on your designated Care Team:    Melina Copa, PA-C  Ermalinda Barrios,  PA-C  Other Instructions

## 2020-05-05 ENCOUNTER — Telehealth: Payer: Self-pay | Admitting: *Deleted

## 2020-05-05 ENCOUNTER — Ambulatory Visit (HOSPITAL_COMMUNITY)
Admission: RE | Admit: 2020-05-05 | Discharge: 2020-05-05 | Disposition: A | Discharge status: Home / Self Care | Payer: Medicare Other | Source: Ambulatory Visit | Attending: Cardiovascular Disease | Admitting: Cardiovascular Disease

## 2020-05-05 ENCOUNTER — Telehealth: Payer: Self-pay | Admitting: Hematology

## 2020-05-05 DIAGNOSIS — E7800 Pure hypercholesterolemia, unspecified: Secondary | ICD-10-CM | POA: Diagnosis not present

## 2020-05-05 DIAGNOSIS — I10 Essential (primary) hypertension: Secondary | ICD-10-CM | POA: Insufficient documentation

## 2020-05-05 DIAGNOSIS — I2510 Atherosclerotic heart disease of native coronary artery without angina pectoris: Secondary | ICD-10-CM | POA: Diagnosis not present

## 2020-05-05 DIAGNOSIS — I5033 Acute on chronic diastolic (congestive) heart failure: Secondary | ICD-10-CM | POA: Diagnosis present

## 2020-05-05 DIAGNOSIS — M7989 Other specified soft tissue disorders: Secondary | ICD-10-CM | POA: Insufficient documentation

## 2020-05-05 MED ORDER — APIXABAN 5 MG PO TABS
5.0000 mg | ORAL_TABLET | Freq: Two times a day (BID) | ORAL | 2 refills | Status: DC
Start: 2020-05-05 — End: 2020-06-05

## 2020-05-05 NOTE — Telephone Encounter (Signed)
Pt reporting that this medication is going to be too expensive for him to afford. Pt given [facility] pt assistance number to call to see about cost assistance.  (pt states he has been on this medication years prior) Aware I will CC the person in our office who helps in these matters, if needed. Aware I will place some samples at the front desk, along with a 30 day free care.  Pt will try and stop by the office this afternoon to pick up. He appreciates the help with this.

## 2020-05-05 NOTE — Telephone Encounter (Signed)
-----   Message from [name], MD sent at 05/05/2020 11:15 AM EST ----- He has evidence of clot in his left peroneal vein. I would not recommend anti-coagulation generally for clot in a below knee vein but given his swelling and malignancy, I think Eliquis is indicated for 3 months. I would recommend Eliquis 5 mg po BID. (Normal renal function, normal weight over 60 kg). He can also review this with his cancer doctor if he prefers.   I will include Dr. Irene Limbo on this message as well.   Thanks, chris

## 2020-05-05 NOTE — Telephone Encounter (Signed)
Scheduled per los, patient has been called and notified of upcoming appointments. 

## 2020-05-05 NOTE — Telephone Encounter (Signed)
Called pt's mobile number which is his daughter's number. Harold [name] (DPR). Adv of results of LE doppler and recommendation under the circumstances of swelling and malignancy to begin Eliquis 5 mg twice daily.  Sent to [employer].  Adv of recommendation to discuss w Dr. Irene Limbo as well and that he has been cc'd on this result.  She will call and speak with patient about this now.

## 2020-05-05 NOTE — Telephone Encounter (Signed)
Harold [name] is calling due to speaking with his daughter in regards to his results and the recommendation to start Eliquis that were given. He states this medication is very expensive and will most likely put him in the doughnut hole. He is requesting a callback to discuss assistance with the price before picking up the medication. Please advise.

## 2020-05-08 ENCOUNTER — Inpatient Hospital Stay: Payer: Medicare Other

## 2020-05-08 ENCOUNTER — Other Ambulatory Visit: Payer: Self-pay | Admitting: Hematology

## 2020-05-08 ENCOUNTER — Other Ambulatory Visit: Payer: Self-pay

## 2020-05-08 VITALS — BP 113/48 | HR 63 | Temp 98.0°F | Resp 16 | Wt 157.0 lb

## 2020-05-08 DIAGNOSIS — K029 Dental caries, unspecified: Secondary | ICD-10-CM

## 2020-05-08 DIAGNOSIS — C8208 Follicular lymphoma grade I, lymph nodes of multiple sites: Secondary | ICD-10-CM

## 2020-05-08 DIAGNOSIS — Z5112 Encounter for antineoplastic immunotherapy: Secondary | ICD-10-CM

## 2020-05-08 DIAGNOSIS — Z5111 Encounter for antineoplastic chemotherapy: Secondary | ICD-10-CM | POA: Diagnosis not present

## 2020-05-08 DIAGNOSIS — C8291 Follicular lymphoma, unspecified, lymph nodes of head, face, and neck: Secondary | ICD-10-CM

## 2020-05-08 DIAGNOSIS — Z7189 Other specified counseling: Secondary | ICD-10-CM

## 2020-05-08 LAB — CMP (CANCER CENTER ONLY)
ALT: 10 U/L (ref 0–44)
AST: 12 U/L — ABNORMAL LOW (ref 15–41)
Albumin: 4 g/dL (ref 3.5–5.0)
Alkaline Phosphatase: 65 U/L (ref 38–126)
Anion gap: 9 (ref 5–15)
BUN: 25 mg/dL — ABNORMAL HIGH (ref 8–23)
CO2: 28 mmol/L (ref 22–32)
Calcium: 9.3 mg/dL (ref 8.9–10.3)
Chloride: 105 mmol/L (ref 98–111)
Creatinine: 0.92 mg/dL (ref 0.61–1.24)
GFR, Estimated: >60 mL/min (ref >60)
Glucose, Bld: 89 mg/dL (ref 70–99)
Potassium: 3.8 mmol/L (ref 3.5–5.1)
Sodium: 142 mmol/L (ref 135–145)
Total Bilirubin: 0.7 mg/dL (ref 0.3–1.2)
Total Protein: 6.7 g/dL (ref 6.5–8.1)

## 2020-05-08 LAB — CBC WITH DIFFERENTIAL/PLATELET
Abs Immature Granulocytes: 0.04 10*3/uL (ref 0.00–0.07)
Basophils Absolute: 0.1 10*3/uL (ref 0.0–0.1)
Basophils Relative: 1 %
Eosinophils Absolute: 0.1 10*3/uL (ref 0.0–0.5)
Eosinophils Relative: 1 %
HCT: 38.2 % — ABNORMAL LOW (ref 39.0–52.0)
Hemoglobin: 12.1 g/dL — ABNORMAL LOW (ref 13.0–17.0)
Immature Granulocytes: 0 %
Lymphocytes Relative: 17 %
Lymphs Abs: 1.6 10*3/uL (ref 0.7–4.0)
MCH: 27.4 pg (ref 26.0–34.0)
MCHC: 31.7 g/dL (ref 30.0–36.0)
MCV: 86.4 fL (ref 80.0–100.0)
Monocytes Absolute: 0.9 10*3/uL (ref 0.1–1.0)
Monocytes Relative: 9 %
Neutro Abs: 7.1 10*3/uL (ref 1.7–7.7)
Neutrophils Relative %: 72 %
Platelets: 258 10*3/uL (ref 150–400)
RBC: 4.42 MIL/uL (ref 4.22–5.81)
RDW: 13.5 % (ref 11.5–15.5)
WBC: 9.7 10*3/uL (ref 4.0–10.5)
nRBC: 0 % (ref 0.0–0.2)

## 2020-05-08 MED ORDER — METHYLPREDNISOLONE SODIUM SUCC 125 MG IJ SOLR
125.0000 mg | Freq: Once | INTRAMUSCULAR | Status: AC
  Administered 2020-05-08: 125 mg via INTRAVENOUS

## 2020-05-08 MED ORDER — FAMOTIDINE IN NACL 20-0.9 MG/50ML-% IV SOLN
INTRAVENOUS | Status: AC
  Filled 2020-05-08: qty 50

## 2020-05-08 MED ORDER — DIPHENHYDRAMINE HCL 25 MG PO CAPS
ORAL_CAPSULE | ORAL | Status: AC
  Filled 2020-05-08: qty 2

## 2020-05-08 MED ORDER — FAMOTIDINE IN NACL 20-0.9 MG/50ML-% IV SOLN
20.0000 mg | Freq: Once | INTRAVENOUS | Status: AC
  Administered 2020-05-08: 20 mg via INTRAVENOUS

## 2020-05-08 MED ORDER — DIPHENHYDRAMINE HCL 25 MG PO CAPS
50.0000 mg | ORAL_CAPSULE | Freq: Once | ORAL | Status: AC
  Administered 2020-05-08: 50 mg via ORAL

## 2020-05-08 MED ORDER — ACETAMINOPHEN 325 MG PO TABS
650.0000 mg | ORAL_TABLET | Freq: Once | ORAL | Status: AC
  Administered 2020-05-08: 650 mg via ORAL

## 2020-05-08 MED ORDER — METHYLPREDNISOLONE SODIUM SUCC 125 MG IJ SOLR
INTRAMUSCULAR | Status: AC
  Filled 2020-05-08: qty 2

## 2020-05-08 MED ORDER — SODIUM CHLORIDE 0.9 % IV SOLN
Freq: Once | INTRAVENOUS | Status: AC
  Filled 2020-05-08: qty 250

## 2020-05-08 MED ORDER — ACETAMINOPHEN 325 MG PO TABS
ORAL_TABLET | ORAL | Status: AC
  Filled 2020-05-08: qty 2

## 2020-05-08 MED ORDER — SODIUM CHLORIDE 0.9 % IV SOLN
375.0000 mg/m2 | Freq: Once | INTRAVENOUS | Status: AC
  Administered 2020-05-08: 700 mg via INTRAVENOUS
  Filled 2020-05-08: qty 50

## 2020-05-08 NOTE — Telephone Encounter (Signed)
**Note De-Identified Alexsis Kathman Obfuscation** I have spoken with the pts daughter (DPR) Debra Crisco. I did advise her to call [employer] (I gave her their number) to discuss their Eliquis program, the pts chances of eligibility, and to request that they mail them an application.  She is aware that when they receive the application to complete the pt part, obtain required documents per [employer], and to bring all to the office to drop off and that we will handle the provider page and will fax all to [employer].  Hilda Blades thanked me for calling her with this information.

## 2020-05-08 NOTE — Telephone Encounter (Signed)
Spoke with Dr. Angelena Form re: aspirin and Eliquis.  Okay for pt to continue aspirin as eliquis would only be short term.

## 2020-05-08 NOTE — Telephone Encounter (Signed)
Thanks

## 2020-05-08 NOTE — Patient Instructions (Signed)
[facility] Discharge Instructions for Patients Receiving Chemotherapy  Today you received the following chemotherapy agents: rituximab.  To help prevent nausea and vomiting after your treatment, we encourage you to take your nausea medication as directed.   If you develop nausea and vomiting that is not controlled by your nausea medication, call the clinic.   BELOW ARE SYMPTOMS THAT SHOULD BE REPORTED IMMEDIATELY:  *FEVER GREATER THAN 100.5 F  *CHILLS WITH OR WITHOUT FEVER  NAUSEA AND VOMITING THAT IS NOT CONTROLLED WITH YOUR NAUSEA MEDICATION  *UNUSUAL SHORTNESS OF BREATH  *UNUSUAL BRUISING OR BLEEDING  TENDERNESS IN MOUTH AND THROAT WITH OR WITHOUT PRESENCE OF ULCERS  *URINARY PROBLEMS  *BOWEL PROBLEMS  UNUSUAL RASH Items with * indicate a potential emergency and should be followed up as soon as possible.  Feel free to call the clinic should you have any questions or concerns. The clinic phone number is [phone].  Please show the CHEMO ALERT CARD at check-in to the Emergency Department and triage nurse.   

## 2020-05-14 NOTE — Progress Notes (Signed)
HEMATOLOGY/ONCOLOGY CLINIC NOTE  Date of Service: 05/14/2020  Patient Care Team: [name], MD as PCP - General (Family Medicine) [name], MD as PCP - Cardiology (Cardiology)  CHIEF COMPLAINTS/PURPOSE OF CONSULTATION:  mx of low grade Follicular Lymphoma  HISTORY OF PRESENTING ILLNESS:   Harold [name] is a wonderful 86 y.o. male who has been referred to [place] by Dr [name] for evaluation and management of Follicular Lymphoma. He is accompanied today by his sister. The pt reports that he is doing well overall.   The pt reports first noticing the rt neck mass develop in his neck about a year ago and saw his PCP Dr [name].  The mass was getting larger and so he was referred to Dr [name] for further evaluation and had a biopsy of the rt upper neck mass which showed Grade 1-2 follicular lymphoma. pt has had Tissue flow cytometry completed on 08/05/17 with results revealing A LAMBDA-RESTRICTED MONOCLONAL B-CELL POPULATION EXPRESSING CD10 COMPRISES 39% OF ALL LYMPHOCYTES.  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 He denies feeling any differently now than he did a year ago. He takes daily 81mg  aspirin which he notes has led to some mild increased bruising.   Of note prior to the patient's visit today,  He notes that he has had a couple spots of melanoma on his chin and back about 5 and 8 years ago. These were subsequently removed. He has had a basal cell as well and has been treated several times with liquid nitrogen.   On review of systems, pt reports stable energy levels, some bruising, stable bowel habits, and denies fevers, chills, night sweats, unexpected weight loss, pain along the spine, abdominal pains, testicular pain or swelling, and any other symptoms.   On PMHx the pt reports melanoma, CAD,  HTN, HLD, cataract in left eye.   INTERVAL HISTORY:  Harold [name] returns today for management and evaluation of his follicular lymphoma and his second of four doses of weekly Rituxan. The patient's last visit with [place] was on 05/01/2020. The pt reports that he is doing well overall.  The pt reports that he got an [place] of his legs on February 18 that showed a minor blood clot that was old and small. He has since been put back on Eliquis for 3 months. The pt reports that he has a f/u appt on March 21.The pt reports that he has been having issues with his insurance and the [facility]. The pt reports that the leg swelling in his feet has improved and it is now easier to get his boots on. The pt reports that he has been frequently urinating. The pt reports no issues tolerating the treatment and notes improved fatigue.  Lab results today 05/15/2020 of CBC w/diff and CMP is as follows: all values are WNL except for Hgb of 12.3, HCT of 38.9, AST of 12.  On review of systems, pt denies new swelling, leg swelling, fatigue, abdominal pain, back pain and any other symptoms.  MEDICAL HISTORY:  Past Medical History:  Diagnosis Date  . BPH (benign prostatic hypertrophy)   . CAD (coronary artery disease) 01/2010   S/P drug eluting stents LAD and PL branch of RCA Dr. Olevia Perches  . follicular lymphoma dx'd 06/2017  . Hernia   . HLD (hyperlipidemia)   .  HTN (hypertension)     SURGICAL HISTORY: Past Surgical History:  Procedure Laterality Date  . BACK SURGERY    . CYSTOSCOPY WITH LITHOLAPAXY N/A 05/20/2019   Procedure: CYSTOSCOPY WITH LITHOLAPAXY;  Surgeon: Ardis Hughs, MD;  Location: [facility];  Service: Urology;  Laterality: N/A;  . CYSTOSCOPY/URETEROSCOPY/HOLMIUM LASER/STENT PLACEMENT Left 05/20/2019   Procedure: CYSTOSCOPY/URETEROSCOPY/HOLMIUM LASER/STENT PLACEMENT;  Surgeon: Ardis Hughs, MD;  Location: [facility];  Service: Urology;  Laterality: Left;  . HERNIA REPAIR  01/08/11   RIH  . MASS  EXCISION Right 08/05/2017   Procedure: EXCISION RIGHT POSTERIOR  NECK LYMPH NODE;  Surgeon: Rozetta Nunnery, MD;  Location: [place];  Service: ENT;  Laterality: Right;    SOCIAL HISTORY: Social History   Socioeconomic History  . Marital status: Married    Spouse name: Not on file  . Number of children: Not on file  . Years of education: Not on file  . Highest education level: Not on file  Occupational History  . Not on file  Tobacco Use  . Smoking status: Never Smoker  . Smokeless tobacco: Never Used  Vaping Use  . Vaping Use: Never used  Substance and Sexual Activity  . Alcohol use: No  . Drug use: No  . Sexual activity: Not on file  Other Topics Concern  . Not on file  Social History Narrative   Married   Social Determinants of Health   Financial Resource Strain: Not on file  Food Insecurity: Not on file  Transportation Needs: Not on file  Physical Activity: Not on file  Stress: Not on file  Social Connections: Not on file  Intimate Partner Violence: Not on file    FAMILY HISTORY: Family History  Problem Relation Age of Onset  . Heart failure Mother     ALLERGIES:  has No Known Allergies.  MEDICATIONS:  Current Outpatient Medications  Medication Sig Dispense Refill  . allopurinol (ZYLOPRIM) 100 MG tablet Take 1 tablet (100 mg total) by mouth 2 (two) times daily. (Patient not taking: No sig reported) 30 tablet 0  . apixaban (ELIQUIS) 5 MG TABS tablet Take 1 tablet (5 mg total) by mouth 2 (two) times daily. 60 tablet 2  . aspirin 81 MG tablet Take 81 mg by mouth daily.    [name] atenolol (TENORMIN) 25 MG tablet Take 1 tablet (25 mg total) by mouth daily. 90 tablet 3  . brimonidine (ALPHAGAN) 0.2 % ophthalmic solution Place 1 drop into the right eye 2 (two) times daily.    . cholecalciferol (VITAMIN D) 1000 UNITS tablet Take 1,000 Units by mouth daily.    . Cyanocobalamin (B-12) 2500 MCG TABS Take 2,500 mcg by mouth daily.    . finasteride  (PROSCAR) 5 MG tablet Take 5 mg by mouth daily.    . furosemide (LASIX) 20 MG tablet Take 1 tablet (20 mg total) by mouth daily. 90 tablet 3  . gabapentin (NEURONTIN) 100 MG capsule Take 100 mg by mouth daily as needed (knee pain).     . isosorbide dinitrate (ISORDIL) 5 MG tablet Take 1 tablet (5 mg total) by mouth 2 (two) times daily. 180 tablet 3  . lisinopril (ZESTRIL) 5 MG tablet Take 5 mg by mouth daily.    . nitroGLYCERIN (NITROSTAT) 0.4 MG SL tablet Place 1 tablet (0.4 mg total) under the tongue every 5 (five) minutes as needed for chest pain. 25 tablet 6  . ondansetron (ZOFRAN) 8 MG tablet Take 1 tablet (8 mg  total) by mouth every 8 (eight) hours as needed for nausea. 30 tablet 0  . potassium chloride SA (KLOR-CON) 20 MEQ tablet Take 1 tablet (20 mEq total) by mouth daily. 90 tablet 3  . pravastatin (PRAVACHOL) 40 MG tablet Take 1 tablet (40 mg total) by mouth at bedtime. (Patient taking differently: Take 40 mg by mouth every Monday, Wednesday, and Friday.) 90 tablet 3  . timolol (TIMOPTIC) 0.5 % ophthalmic solution Place 1 drop into the right eye daily.     No current facility-administered medications for this visit.    REVIEW OF SYSTEMS:   10 Point review of Systems was done is negative except as noted above.  PHYSICAL EXAMINATION: ECOG FS:2 - Symptomatic, <50% confined to bed  Vitals:   05/15/20 1042  BP: 132/70  Pulse: 73  Resp: 18  Temp: (!) 97.3 F (36.3 C)  SpO2: 100%   Wt Readings from Last 3 Encounters:  05/08/20 157 lb (71.2 kg)  05/04/20 159 lb 6.4 oz (72.3 kg)  05/01/20 161 lb 4.8 oz (73.2 kg)   Body mass index is 23.88 kg/m.    Exam was given in a chair.   GENERAL:alert, in no acute distress and comfortable SKIN: no acute rashes, no significant lesions EYES: conjunctiva are pink and non-injected, sclera anicteric OROPHARYNX: MMM, no exudates, no oropharyngeal erythema or ulceration NECK: supple, no JVD LYMPH:  no palpable lymphadenopathy in the  cervical, axillary or inguinal regions LUNGS: clear to auscultation b/l with normal respiratory effort HEART: regular rate & rhythm ABDOMEN:  normoactive bowel sounds , non tender, not distended. Extremity: no pedal edema PSYCH: alert & oriented x 3 with fluent speech NEURO: no focal motor/sensory deficits  LABORATORY DATA:  I have reviewed the data as listed  CBC Latest Ref Rng & Units 05/08/2020 05/01/2020 04/24/2020  WBC 4.0 - 10.5 K/uL 9.7 8.8 8.2  Hemoglobin 13.0 - 17.0 g/dL 12.1(L) 12.7(L) 12.2(L)  Hematocrit 39.0 - 52.0 % 38.2(L) 39.6 38.5(L)  Platelets 150 - 400 K/uL 258 292 274    CMP Latest Ref Rng & Units 05/08/2020 05/01/2020 04/24/2020  Glucose 70 - 99 mg/dL 89 94 96  BUN 8 - 23 mg/dL 25(H) 19 21  Creatinine 0.61 - 1.24 mg/dL 0.92 0.89 0.83  Sodium 135 - 145 mmol/L 142 140 142  Potassium 3.5 - 5.1 mmol/L 3.8 3.8 3.9  Chloride 98 - 111 mmol/L 105 105 104  CO2 22 - 32 mmol/L 28 27 28   Calcium 8.9 - 10.3 mg/dL 9.3 9.5 9.5  Total Protein 6.5 - 8.1 g/dL 6.7 7.1 6.8  Total Bilirubin 0.3 - 1.2 mg/dL 0.7 1.0 0.8  Alkaline Phos 38 - 126 U/L 65 73 68  AST 15 - 41 U/L 12(L) 14(L) 13(L)  ALT 0 - 44 U/L 10 11 7   uric acid 6.4  Lab Results  Component Value Date   LDH 154 03/21/2020     02/18/2019 CT Abdomen Pelvis W Contrast (Accession [id number]):   02/18/2019 CT Chest W Contrast (Accession [id number]):  08/08/17 Pathology:   08/05/17 Pathology:     RADIOGRAPHIC STUDIES: I have personally reviewed the radiological images as listed and agreed with the findings in the report. VAS [place] LOWER EXTREMITY VENOUS (DVT)  Result Date: 05/05/2020  Lower Venous DVT Study Indications: Bilateral lower extremity swelling, left greater than right. Patient denies any SOB.  Risk Factors: Cancer and currently undergoing treatment x 2 weeks for Non Hodgkin's Lymphoma. He has a prior history of it in 2019. Anticoagulation:  81 mg Aspirin.  Comparison Study: NA Performing Technologist: Sharlett Iles RVT  Examination Guidelines: A complete evaluation includes B-mode imaging, spectral Doppler, color Doppler, and power Doppler as needed of all accessible portions of each vessel. Bilateral testing is considered an integral part of a complete examination. Limited examinations for reoccurring indications may be performed as noted. The reflux portion of the exam is performed with the patient in reverse Trendelenburg.  +---------+---------------+---------+-----------+----------+--------------+ RIGHT    CompressibilityPhasicitySpontaneityPropertiesThrombus Aging +---------+---------------+---------+-----------+----------+--------------+ CFV      Full           Yes      Yes                                 +---------+---------------+---------+-----------+----------+--------------+ SFJ      Full           Yes      Yes                                 +---------+---------------+---------+-----------+----------+--------------+ FV Prox  Full           Yes      Yes                                 +---------+---------------+---------+-----------+----------+--------------+ FV Mid   Full                                                        +---------+---------------+---------+-----------+----------+--------------+ FV DistalFull           Yes      Yes                                 +---------+---------------+---------+-----------+----------+--------------+ PFV      Full           Yes      Yes                                 +---------+---------------+---------+-----------+----------+--------------+ POP      Full           Yes      Yes                                 +---------+---------------+---------+-----------+----------+--------------+ PTV      Full                    No                                  +---------+---------------+---------+-----------+----------+--------------+ PERO     Full                    No                                   +---------+---------------+---------+-----------+----------+--------------+ Gastroc  Full                                                        +---------+---------------+---------+-----------+----------+--------------+ GSV      Full           Yes      Yes                                 +---------+---------------+---------+-----------+----------+--------------+   Right Technical Findings: Complex structure in the right inguinal area with increase vascularity measuring approximately 8.5 cm x 6.2 cm. This structure appears to compress the saphenofemoral junction and proximal great saphenous vein. This could possibly be an enlarged lymph node vs inguinal hernia.  +---------+---------------+---------+-----------+---------------+-------------+ LEFT     CompressibilityPhasicitySpontaneityProperties     Thrombus                                                                 Aging         +---------+---------------+---------+-----------+---------------+-------------+ CFV      Full           Yes      Yes                                     +---------+---------------+---------+-----------+---------------+-------------+ SFJ      Full           Yes      Yes                                     +---------+---------------+---------+-----------+---------------+-------------+ FV Prox  Full           Yes      Yes                                     +---------+---------------+---------+-----------+---------------+-------------+ FV Mid   Full                                                            +---------+---------------+---------+-----------+---------------+-------------+ FV DistalFull           Yes      Yes                                     +---------+---------------+---------+-----------+---------------+-------------+ PFV      Full           Yes      Yes                                      +---------+---------------+---------+-----------+---------------+-------------+  POP      Full           Yes      Yes                                     +---------+---------------+---------+-----------+---------------+-------------+ PTV      Full                    No                                      +---------+---------------+---------+-----------+---------------+-------------+ PERO     Full                    No         brightly       Chronic                                                   echogenic                    +---------+---------------+---------+-----------+---------------+-------------+ Gastroc  Full                                                            +---------+---------------+---------+-----------+---------------+-------------+ GSV      Full           Yes      Yes                                     +---------+---------------+---------+-----------+---------------+-------------+   Left Technical Findings: Avascular complex structure in the left inguinal area measuring 2.0 cm x 1.3 cm. This structure appears posterior to the proximal great saphenous vein. This could possibly be an enlarged lymph node.   Summary: RIGHT: - No evidence of deep vein thrombosis in the lower extremity. No indirect evidence of obstruction proximal to the inguinal ligament. - No cystic structure found in the popliteal fossa.  LEFT: - Findings consistent with chronic deep vein thrombosis involving the left peroneal veins. - No cystic structure found in the popliteal fossa.  *See table(s) above for measurements and observations. Electronically signed by Ida Rogue MD on 05/05/2020 at 9:21:22 PM.    Final     ASSESSMENT & PLAN:   86 y.o. male with  1.h/o  atleast Stage III Follicular Lymphoma Grade 1-2 presenting with rt cervical lymphadenopathy  08/05/17 pathology results of right cervical LN revealing low grade follicular lymphoma  2/50/53 PET revealed  Hypermetabolic lymphadenopathy involving the right neck, bilateral axillary, right hilar and right inguinal lymph nodes. No abdominal or pelvic involvement.    03/20/18 CT C/A/P revealed Progressive bilateral axillary and right inguinal lymphadenopathy, including necrotic lymph nodes in the right axilla, as above. Spleen is normal in size. 4 mm distal left ureteral calculus, unchanged. No hydronephrosis. Stable layering bladder calculi measuring up to 5 mm. Cholelithiasis, without associated inflammatory changes. Stable  sequela of prior/chronic pancreatitis.   11/03/2018 CT neck revealed "Marked interval progression of cervical lymphadenopathy since PET-CT 09/02/2017, now with lymphadenopathy present bilaterally at essentially all stations. Index nodes as described. This includes bilateral intraparotid lymphadenopathy. Bulky right-sided adenopathy results in multifocal narrowing of the right internal jugular vein. Significant narrowing of the mid to lower left internal jugular vein of unclear cause as there is no significant mass effect at this level."  11/03/2018 CT CAP revealed "1. Progression of bilateral axillary, retroperitoneal, pelvic, and inguinal adenopathy. New anterior and posterior lower cervical adenopathy identified within the neck. 2. Persistent distal left ureteral calculus. Small bladder calculi are unchanged. 3. Aortic Atherosclerosis (ICD10-I70.0). Coronary artery calcifications. 4. Sequelae of chronic pancreatitis noted."  10/01/2019 CT Abd/Pel ([id number]) revealed no evidence of mass in right kidney, new and enlarged abdominal and pelvic lymph nodes.   2. 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 3. H/o head and neck Basal cell carcinoma Continue routine skin checks with dermatologist    4. Renal indeterminate lesion -06/28/19 Renal US (09811) revealed New right-sided mass at the hilum which measures 1.5cm in size.   -resolved on subsequent scans  5. Urinary stones in ureter and  bladder.   PLAN: -Discussed pt labwork today, 05/15/2020; blood counts stable and chemistries normal.  - Advised the pt we will send a three month supply to [facility] for Eliquis for discounted price. -Advised pt that old clots do not dissolve as fast and in same manner as newer clots. -The pt has no prohibitive toxicities from continuing Rituxan at this time. -Discussed option of switching to watching it following treatment or continuing to maintenance treatment to get as deepest response as possible. The pt is agreeable to starting maintenance for at least a year.  -Recommended pt continue to eat well and drink 48-64 oz water daily.  -Continue Multivitamin, Vitamin D, and B12 daily.  -Continue Lasix at 40 mg daily, one in morning and one at lunch time. -Will see back in 3 months prior to maintenance treatment. Will get scans and labs before next visit.   FOLLOW UP:  CT chest/abd/pelvis in 11 weeks Labs in 11 weeks PLz schedule to start maintenance Rituxan in 12 weeks with labs and MD visit  The total time spent in the appointment was 30 minutes and more than 50% was on counseling and direct patient cares.   All of the patient's questions were answered with apparent satisfaction. The patient knows to call the clinic with any problems, questions or concerns.   Sullivan Lone MD [place] AAHIVMS [facility] Hematology/Oncology Physician [facility]  (Office):       [phone] (Work cell):  [phone] (Fax):           [phone]  05/14/2020 10:56 AM  I, Reinaldo Raddle, am acting as scribe for Dr. Sullivan Lone, MD.   .I have reviewed the above documentation for accuracy and completeness, and I agree with the above. Brunetta Genera MD

## 2020-05-15 ENCOUNTER — Other Ambulatory Visit: Payer: Self-pay

## 2020-05-15 ENCOUNTER — Inpatient Hospital Stay: Payer: Medicare Other | Admitting: Hematology

## 2020-05-15 ENCOUNTER — Inpatient Hospital Stay: Payer: Medicare Other

## 2020-05-15 VITALS — BP 132/70 | HR 73 | Temp 97.3°F | Resp 18 | Ht 69.0 in | Wt 161.7 lb

## 2020-05-15 VITALS — BP 128/54 | HR 64 | Temp 97.8°F | Resp 18

## 2020-05-15 DIAGNOSIS — C8208 Follicular lymphoma grade I, lymph nodes of multiple sites: Secondary | ICD-10-CM

## 2020-05-15 DIAGNOSIS — Z5112 Encounter for antineoplastic immunotherapy: Secondary | ICD-10-CM

## 2020-05-15 DIAGNOSIS — K029 Dental caries, unspecified: Secondary | ICD-10-CM

## 2020-05-15 DIAGNOSIS — C8291 Follicular lymphoma, unspecified, lymph nodes of head, face, and neck: Secondary | ICD-10-CM

## 2020-05-15 DIAGNOSIS — Z7189 Other specified counseling: Secondary | ICD-10-CM

## 2020-05-15 DIAGNOSIS — Z5111 Encounter for antineoplastic chemotherapy: Secondary | ICD-10-CM | POA: Diagnosis not present

## 2020-05-15 LAB — CBC WITH DIFFERENTIAL/PLATELET
Abs Immature Granulocytes: 0.04 10*3/uL (ref 0.00–0.07)
Basophils Absolute: 0 10*3/uL (ref 0.0–0.1)
Basophils Relative: 1 %
Eosinophils Absolute: 0.1 10*3/uL (ref 0.0–0.5)
Eosinophils Relative: 1 %
HCT: 38.9 % — ABNORMAL LOW (ref 39.0–52.0)
Hemoglobin: 12.3 g/dL — ABNORMAL LOW (ref 13.0–17.0)
Immature Granulocytes: 1 %
Lymphocytes Relative: 15 %
Lymphs Abs: 1.3 10*3/uL (ref 0.7–4.0)
MCH: 27.5 pg (ref 26.0–34.0)
MCHC: 31.6 g/dL (ref 30.0–36.0)
MCV: 86.8 fL (ref 80.0–100.0)
Monocytes Absolute: 0.8 10*3/uL (ref 0.1–1.0)
Monocytes Relative: 9 %
Neutro Abs: 6.1 10*3/uL (ref 1.7–7.7)
Neutrophils Relative %: 73 %
Platelets: 215 10*3/uL (ref 150–400)
RBC: 4.48 MIL/uL (ref 4.22–5.81)
RDW: 13.6 % (ref 11.5–15.5)
WBC: 8.3 10*3/uL (ref 4.0–10.5)
nRBC: 0 % (ref 0.0–0.2)

## 2020-05-15 LAB — CMP (CANCER CENTER ONLY)
ALT: 9 U/L (ref 0–44)
AST: 12 U/L — ABNORMAL LOW (ref 15–41)
Albumin: 3.7 g/dL (ref 3.5–5.0)
Alkaline Phosphatase: 67 U/L (ref 38–126)
Anion gap: 8 (ref 5–15)
BUN: 20 mg/dL (ref 8–23)
CO2: 27 mmol/L (ref 22–32)
Calcium: 9.1 mg/dL (ref 8.9–10.3)
Chloride: 105 mmol/L (ref 98–111)
Creatinine: 0.81 mg/dL (ref 0.61–1.24)
GFR, Estimated: >60 mL/min (ref >60)
Glucose, Bld: 93 mg/dL (ref 70–99)
Potassium: 4 mmol/L (ref 3.5–5.1)
Sodium: 140 mmol/L (ref 135–145)
Total Bilirubin: 0.6 mg/dL (ref 0.3–1.2)
Total Protein: 6.5 g/dL (ref 6.5–8.1)

## 2020-05-15 MED ORDER — SODIUM CHLORIDE 0.9 % IV SOLN
Freq: Once | INTRAVENOUS | Status: AC
  Filled 2020-05-15: qty 250

## 2020-05-15 MED ORDER — FAMOTIDINE IN NACL 20-0.9 MG/50ML-% IV SOLN
INTRAVENOUS | Status: AC
  Filled 2020-05-15: qty 50

## 2020-05-15 MED ORDER — ACETAMINOPHEN 325 MG PO TABS
ORAL_TABLET | ORAL | Status: AC
  Filled 2020-05-15: qty 2

## 2020-05-15 MED ORDER — SODIUM CHLORIDE 0.9 % IV SOLN
375.0000 mg/m2 | Freq: Once | INTRAVENOUS | Status: AC
  Administered 2020-05-15: 700 mg via INTRAVENOUS
  Filled 2020-05-15: qty 50

## 2020-05-15 MED ORDER — ACETAMINOPHEN 325 MG PO TABS
650.0000 mg | ORAL_TABLET | Freq: Once | ORAL | Status: AC
  Administered 2020-05-15: 650 mg via ORAL

## 2020-05-15 MED ORDER — METHYLPREDNISOLONE SODIUM SUCC 125 MG IJ SOLR
125.0000 mg | Freq: Once | INTRAMUSCULAR | Status: AC
  Administered 2020-05-15: 125 mg via INTRAVENOUS

## 2020-05-15 MED ORDER — DIPHENHYDRAMINE HCL 25 MG PO CAPS
50.0000 mg | ORAL_CAPSULE | Freq: Once | ORAL | Status: AC
  Administered 2020-05-15: 50 mg via ORAL

## 2020-05-15 MED ORDER — FAMOTIDINE IN NACL 20-0.9 MG/50ML-% IV SOLN
20.0000 mg | Freq: Once | INTRAVENOUS | Status: AC
  Administered 2020-05-15: 20 mg via INTRAVENOUS

## 2020-05-15 MED ORDER — METHYLPREDNISOLONE SODIUM SUCC 125 MG IJ SOLR
INTRAMUSCULAR | Status: AC
  Filled 2020-05-15: qty 2

## 2020-05-15 MED ORDER — DIPHENHYDRAMINE HCL 25 MG PO CAPS
ORAL_CAPSULE | ORAL | Status: AC
  Filled 2020-05-15: qty 2

## 2020-05-15 NOTE — Patient Instructions (Signed)
[facility] Discharge Instructions for Patients Receiving Chemotherapy  Today you received the following chemotherapy agents: rituximab.  To help prevent nausea and vomiting after your treatment, we encourage you to take your nausea medication as directed.   If you develop nausea and vomiting that is not controlled by your nausea medication, call the clinic.   BELOW ARE SYMPTOMS THAT SHOULD BE REPORTED IMMEDIATELY:  *FEVER GREATER THAN 100.5 F  *CHILLS WITH OR WITHOUT FEVER  NAUSEA AND VOMITING THAT IS NOT CONTROLLED WITH YOUR NAUSEA MEDICATION  *UNUSUAL SHORTNESS OF BREATH  *UNUSUAL BRUISING OR BLEEDING  TENDERNESS IN MOUTH AND THROAT WITH OR WITHOUT PRESENCE OF ULCERS  *URINARY PROBLEMS  *BOWEL PROBLEMS  UNUSUAL RASH Items with * indicate a potential emergency and should be followed up as soon as possible.  Feel free to call the clinic should you have any questions or concerns. The clinic phone number is [phone].  Please show the CHEMO ALERT CARD at check-in to the Emergency Department and triage nurse.   

## 2020-05-22 ENCOUNTER — Other Ambulatory Visit: Payer: Self-pay

## 2020-05-22 ENCOUNTER — Ambulatory Visit (INDEPENDENT_AMBULATORY_CARE_PROVIDER_SITE_OTHER): Payer: Medicare Other | Admitting: Dentistry

## 2020-05-22 VITALS — BP 127/55 | HR 77 | Temp 98.3°F

## 2020-05-22 DIAGNOSIS — C8208 Follicular lymphoma grade I, lymph nodes of multiple sites: Secondary | ICD-10-CM

## 2020-05-22 DIAGNOSIS — K06020 Generalized gingival recession, unspecified: Secondary | ICD-10-CM

## 2020-05-22 DIAGNOSIS — K083 Retained dental root: Secondary | ICD-10-CM | POA: Diagnosis not present

## 2020-05-22 DIAGNOSIS — K030 Excessive attrition of teeth: Secondary | ICD-10-CM

## 2020-05-22 DIAGNOSIS — K029 Dental caries, unspecified: Secondary | ICD-10-CM

## 2020-05-22 DIAGNOSIS — Z0120 Encounter for dental examination and cleaning without abnormal findings: Secondary | ICD-10-CM

## 2020-05-22 DIAGNOSIS — K08109 Complete loss of teeth, unspecified cause, unspecified class: Secondary | ICD-10-CM

## 2020-05-22 DIAGNOSIS — K036 Deposits [accretions] on teeth: Secondary | ICD-10-CM

## 2020-05-22 DIAGNOSIS — M278 Other specified diseases of jaws: Secondary | ICD-10-CM

## 2020-05-22 DIAGNOSIS — K0850 Unsatisfactory restoration of tooth, unspecified: Secondary | ICD-10-CM

## 2020-05-22 NOTE — Progress Notes (Signed)
Department of Dental Medicine     OUTPATIENT CONSULTATION  Service Date:   05/22/2020  Patient Name:  Harold [name] Date of Birth:   1934/05/30 Medical Record Number: [id number]  Referring Provider:              Fabienne Bruns, MD   PLAN & RECOMMENDATIONS  RECOMMENDATIONS > There are no current signs of acute dental infection including abscess, edema or erythema, or suspicious lesion requiring biopsy.  The patient does have several teeth with decay, retained root tips and periodontal concerns. >> Recommend comprehensive dental treatment including extractions of infected or hopeless teeth, restorations and periodontal therapy to decrease the risk of perioperative and postoperative systemic infection and/or other complications.  Explained to the patient that while he would need to find an outside dental provider for comprehensive care, we could possibly arrange for him to have the hopeless teeth extracted here pending his medical team's recommendations. >>> Plan to discuss with medical team and coordinate treatment as needed.  >>  Recommend the patient establish care at a dental office of their choice for routine dental care including replacement of missing teeth, cleanings and exams. >>  Discussed in detail all treatment options with the patient and they are agreeable to the plan.   Thank you for consulting with [facility] and for the opportunity to participate in this patient's treatment.  Should you have any questions or concerns, please contact the [facility] at [phone].   05/22/2020      CONSULTATION NOTE   COVID 19 SCREENING: The patient denies symptoms concerning for COVID-19 infection including fever, chills, cough, or newly developed shortness of breath.   HISTORY OF PRESENT ILLNESS: >> Harold [name] is a very pleasant 87 y.o. male with h/o HTN, CAD on long-term anticoagulation (Eliquis), osteoarthritis, hyperlipidemia and follicular lymphoma who  presents today for a dental consultation for multiple broken down teeth.   DENTAL HISTORY: >> The patient reports having several teeth that have broken off while eating and frequently gets food stuck in between his teeth.  He reports not having a dentist that he sees regularly at this time due to insurance concerns and he is looking diligently to find an office that will accept his dental insurance or complete comprehensive care at a reasonable price.  He currently deny any dental/orofacial pain or sensitivity. >> Patient is able to manage oral secretions.  Patient denies dysphagia, odynophagia, dysphonia, SOB and neck pain.  Patient denies fever, rigors and malaise.   CHIEF COMPLAINT: Here for a dental evaluation for broken down/carious dentition.   Patient Active Problem List   Diagnosis Date Noted  . Follicular lymphoma grade i, lymph nodes of multiple sites ([place]) 11/03/2018  . Counseling regarding advance care planning and goals of care 11/03/2018  . Bilateral primary osteoarthritis of knee 06/09/2017  . HTN (hypertension)   . HLD (hyperlipidemia)   . CAD, NATIVE VESSEL 01/29/2010  . MUSCLE PAIN 01/29/2010  . CAD (coronary artery disease) 01/16/2010  . HYPERTENSION, BENIGN ESSENTIAL 01/08/2010  . MYOCARDIAL PERFUSION SCAN, WITH STRESS TEST, ABNORMAL 01/08/2010   Past Medical History:  Diagnosis Date  . BPH (benign prostatic hypertrophy)   . CAD (coronary artery disease) 01/2010   S/P drug eluting stents LAD and PL branch of RCA Dr. Olevia Perches  . follicular lymphoma dx'd 06/2017  . Hernia   . HLD (hyperlipidemia)   . HTN (hypertension)    Past Surgical History:  Procedure Laterality Date  . BACK SURGERY    .  CYSTOSCOPY WITH LITHOLAPAXY N/A 05/20/2019   Procedure: CYSTOSCOPY WITH LITHOLAPAXY;  Surgeon: Ardis Hughs, MD;  Location: [facility];  Service: Urology;  Laterality: N/A;  . CYSTOSCOPY/URETEROSCOPY/HOLMIUM LASER/STENT PLACEMENT Left 05/20/2019   Procedure:  CYSTOSCOPY/URETEROSCOPY/HOLMIUM LASER/STENT PLACEMENT;  Surgeon: Ardis Hughs, MD;  Location: [facility];  Service: Urology;  Laterality: Left;  . HERNIA REPAIR  01/08/11   RIH  . MASS EXCISION Right 08/05/2017   Procedure: EXCISION RIGHT POSTERIOR  NECK LYMPH NODE;  Surgeon: Rozetta Nunnery, MD;  Location: [place];  Service: ENT;  Laterality: Right;   No Known Allergies Current Outpatient Medications  Medication Sig Dispense Refill  . allopurinol (ZYLOPRIM) 100 MG tablet Take 1 tablet (100 mg total) by mouth 2 (two) times daily. (Patient not taking: No sig reported) 30 tablet 0  . apixaban (ELIQUIS) 5 MG TABS tablet Take 1 tablet (5 mg total) by mouth 2 (two) times daily. 60 tablet 2  . aspirin 81 MG tablet Take 81 mg by mouth daily.    [name] atenolol (TENORMIN) 25 MG tablet Take 1 tablet (25 mg total) by mouth daily. 90 tablet 3  . brimonidine (ALPHAGAN) 0.2 % ophthalmic solution Place 1 drop into the right eye 2 (two) times daily.    . cholecalciferol (VITAMIN D) 1000 UNITS tablet Take 1,000 Units by mouth daily.    . Cyanocobalamin (B-12) 2500 MCG TABS Take 2,500 mcg by mouth daily.    . finasteride (PROSCAR) 5 MG tablet Take 5 mg by mouth daily.    . furosemide (LASIX) 20 MG tablet Take 1 tablet (20 mg total) by mouth daily. 90 tablet 3  . gabapentin (NEURONTIN) 100 MG capsule Take 100 mg by mouth daily as needed (knee pain).     . isosorbide dinitrate (ISORDIL) 5 MG tablet Take 1 tablet (5 mg total) by mouth 2 (two) times daily. 180 tablet 3  . lisinopril (ZESTRIL) 5 MG tablet Take 5 mg by mouth daily.    . nitroGLYCERIN (NITROSTAT) 0.4 MG SL tablet Place 1 tablet (0.4 mg total) under the tongue every 5 (five) minutes as needed for chest pain. 25 tablet 6  . ondansetron (ZOFRAN) 8 MG tablet Take 1 tablet (8 mg total) by mouth every 8 (eight) hours as needed for nausea. 30 tablet 0  . potassium chloride SA (KLOR-CON) 20 MEQ tablet Take 1 tablet (20 mEq total) by  mouth daily. 90 tablet 3  . pravastatin (PRAVACHOL) 40 MG tablet Take 1 tablet (40 mg total) by mouth at bedtime. (Patient taking differently: Take 40 mg by mouth every Monday, Wednesday, and Friday.) 90 tablet 3  . timolol (TIMOPTIC) 0.5 % ophthalmic solution Place 1 drop into the right eye daily.     No current facility-administered medications for this visit.    LABS: Lab Results  Component Value Date   WBC 8.3 05/15/2020   HGB 12.3 (L) 05/15/2020   HCT 38.9 (L) 05/15/2020   MCV 86.8 05/15/2020   PLT 215 05/15/2020      Component Value Date/Time   NA 140 05/15/2020 1001   K 4.0 05/15/2020 1001   CL 105 05/15/2020 1001   CO2 27 05/15/2020 1001   GLUCOSE 93 05/15/2020 1001   BUN 20 05/15/2020 1001   CREATININE 0.81 05/15/2020 1001   CALCIUM 9.1 05/15/2020 1001   GFRNONAA >60 05/15/2020 1001   GFRAA >60 11/02/2019 0852   Lab Results  Component Value Date   INR 1.0 ratio 01/08/2010   No  results found for: PTT  Social History   Socioeconomic History  . Marital status: Married    Spouse name: Not on file  . Number of children: Not on file  . Years of education: Not on file  . Highest education level: Not on file  Occupational History  . Not on file  Tobacco Use  . Smoking status: Never Smoker  . Smokeless tobacco: Never Used  Vaping Use  . Vaping Use: Never used  Substance and Sexual Activity  . Alcohol use: No  . Drug use: No  . Sexual activity: Not on file  Other Topics Concern  . Not on file  Social History Narrative   Married   Social Determinants of Health   Financial Resource Strain: Not on file  Food Insecurity: Not on file  Transportation Needs: Not on file  Physical Activity: Not on file  Stress: Not on file  Social Connections: Not on file  Intimate Partner Violence: Not on file   Family History  Problem Relation Age of Onset  . Heart failure Mother      REVIEW OF SYSTEMS: Reviewed with the patient as per HPI. PSYCH: Patient denies  having dental phobia.  VITAL SIGNS: BP (!) 127/55 (BP Location: Right Arm)   Pulse 77   Temp 98.3 F (36.8 C)    PHYSICAL EXAM: >> General:  Well-developed, comfortable and in no apparent distress. >> Neurological:  Alert and oriented to person, place and  time. >> Extraoral:  Facial symmetry present without any edema or erythema.  No swelling or lymphadenopathy.  TMJ asymptomatic without clicks or crepitations.  >> Intraoral:  Soft tissues appear well-perfused and mucous membranes moist.  FOM and vestibules soft and not raised. Oral cavity without mass or lesion. No signs of infection, parulis, sinus tract, edema or erythema evident upon exam.   (+) Maxillary Exostoses, Geographic tongue   DENTAL EXAM: Hard tissue exam completed and charted.  >> Dentition:  Overall fair remaining dentition.  Missing teeth, caries, retained root tips, existing restorations/crown and bridge work, generalized staining.  >> The patient is maintaining fair oral hygiene. >> Periodontal: Inflamed, erythematous gingival tissue. Generalized plaque and localized calculus accumulation. Generalized gingival recession. >> Caries: #15 severe decay compromising coronal tooth structure. #7 and #11 recurrent decay. >> Retained Root Tips: #3 and #5 >> Defective Restorations: #7 and #11 resin restorations have recurrent decay. #7 fractured/broken existing resin restoration. #15 large existing amalgam restoration with recurrent decay. >> Endodontics: #3 and #5 retained root tips with previous RCT. >> Removable/Fixed Prosthodontics: #6 existing PFM crown that appears clinically adequate. #13 PFM full-coverage crown with mesial recurrent decay. >> Occlusion: Unable to assess molar occlusion. Non-functional teeth #13, #14, #15 and #30. Supra-erupted teeth #14 and #15. Attrition/wear evident on incisal edges of teeth numbers 8, 9, 10 and 22-27.   RADIOGRAPHIC EXAM: PAN exposed and interpreted.  >> Condyles seated  bilaterally in fossas.  No evidence of abnormal pathology.  All visualized osseous structures appear WNL. >> Generalized mild horizontal bone loss consistent with mild chronic periodontitis vs gingival recession on healthy periodontium.   >> Missing teeth, existing restorations, caries and recurrent caries. #3 and #5 retained root tips with previous endodontic therapy. #13 existing full-coverage crown with recurrent mesial decay. #15 existing amalgam with deep recurrent decay approximating the pulp. #7 existing composite restorations with recurrent mesial decay.   ASSESSMENT:  1. Follicular lymphoma 2. Long-term (current) use of anticoagulants (on Eliquis) 3. Missing teeth 4. Caries 5. Retained  dental root 6. Accretions on teeth 7. Gingival recession 8. Chronic gingivitis 9. Attrition on teeth 10. Defective dental restoration 11. Maxillary exostoses 12. Geographic tongue 13. Postoperative bleeding risk   PLAN AND RECOMMENDATIONS: > I discussed the risks, benefits, and complications of various scenarios with the patient in relationship to their medical and dental conditions, which included systemic infection and pain that could potentially occur if dental/oral concerns are not addressed.  I explained that if any chronic or acute dental/oral infection(s) are addressed and subsequently not maintained, their risk of the previously mentioned complications are just as high and could still occur.  I explained all significant findings of the dental consultation with the patient including multiple teeth with cavities, retained root tips in the upper right quadrant, inflamed gums due to plaque and calculus build-up, and teeth numbers 13 and 15 with large cavities under existing crown and amalgam restoration, and the recommended care including extractions of non-restorable teeth #3, #5, #15 and potentially #13 in order to decrease the risk for acute pain and infection.  The patient verbalized  understanding of all findings, discussion, and recommendations. >> We then discussed various treatment options to include no treatment, multiple extractions with alveoloplasty, pre-prosthetic surgery as indicated, periodontal therapy, dental restorations, root canal therapy, crown and bridge therapy, implant therapy, and replacement of missing teeth as indicated.  The patient verbalized understanding of all options, and currently wishes to proceed with extractions of teeth numbers 3, 5, 13 and 14 with possible full oral rehab in the OR under general anesthesia pending his insurance coverage and medical team's recommendations. >>> Plan to discuss all findings and recommendations with medical team and coordinate future care as needed.  <> The patient tolerated today's visit well.  All questions and concerns were addressed and answered, and the patient departed in stable condition.   Moenkopi Benson Norway, D.M.D.

## 2020-06-05 ENCOUNTER — Other Ambulatory Visit: Payer: Self-pay

## 2020-06-05 ENCOUNTER — Encounter: Payer: Self-pay | Admitting: Cardiovascular Disease

## 2020-06-05 ENCOUNTER — Ambulatory Visit: Payer: Medicare Other | Admitting: Cardiovascular Disease

## 2020-06-05 VITALS — BP 122/60 | HR 75 | Ht 69.0 in | Wt 167.0 lb

## 2020-06-05 DIAGNOSIS — I5032 Chronic diastolic (congestive) heart failure: Secondary | ICD-10-CM

## 2020-06-05 DIAGNOSIS — I10 Essential (primary) hypertension: Secondary | ICD-10-CM

## 2020-06-05 DIAGNOSIS — E7800 Pure hypercholesterolemia, unspecified: Secondary | ICD-10-CM

## 2020-06-05 DIAGNOSIS — I2510 Atherosclerotic heart disease of native coronary artery without angina pectoris: Secondary | ICD-10-CM

## 2020-06-05 DIAGNOSIS — I82402 Acute embolism and thrombosis of unspecified deep veins of left lower extremity: Secondary | ICD-10-CM

## 2020-06-05 MED ORDER — APIXABAN 5 MG PO TABS: 5.0000 mg | ORAL_TABLET | Freq: Two times a day (BID) | ORAL | 0 refills | Status: DC

## 2020-06-05 NOTE — Patient Instructions (Signed)
Medication Instructions:  Your physician recommends that you continue on your current medications as directed. Please refer to the Current Medication list given to you today.  *If you need a refill on your cardiac medications before your next appointment, please call your pharmacy*   Lab Work: none If you have labs (blood work) drawn today and your tests are completely normal, you will receive your results only by: [name] MyChart Message (if you have MyChart) OR . A paper copy in the mail If you have any lab test that is abnormal or we need to change your treatment, we will call you to review the results.   Testing/Procedures: none   Follow-Up: At [facility], you and your health needs are our priority.  As part of our continuing mission to provide you with exceptional heart care, we have created designated Provider Care Teams.  These Care Teams include your primary Cardiologist (physician) and Advanced Practice Providers (APPs -  Physician Assistants and Nurse Practitioners) who all work together to provide you with the care you need, when you need it.  We recommend signing up for the patient portal called "MyChart".  Sign up information is provided on this After Visit Summary.  MyChart is used to connect with patients for Virtual Visits (Telemedicine).  Patients are able to view lab/test results, encounter notes, upcoming appointments, etc.  Non-urgent messages can be sent to your provider as well.   To learn more about what you can do with MyChart, go to [email or web address].    Your next appointment:   6 month(s)  The format for your next appointment:   In Person  Provider:   Lauree Chandler, MD   Other Instructions

## 2020-06-05 NOTE — Progress Notes (Signed)
Chief Complaint  Patient presents with  . Follow-up    CAD   History of Present Illness: 86 yo male with history of CAD, HTN, follicular lymphoma and HLD here today for cardiac follow up. He underwent catheterization in October 2011 because of chest pain and was found to have a severe stenosis in a large posterolateral branch of the right coronary artery and a severe stenosis in the proximal LAD with complete occlusion of the distal LAD. Drug eluting stents were placed in the posterior lateral branch of the right coronary and the mid LAD in October 2011. He was diagnosed with lymphoma in March 2019. This was found when he had a mass on his neck. This was treated with resection. He has been treated with steroids short term for pain from his right axillary nodes. He completed chemotherapy. Echo January 2020 with LVEF=60-65%. No significant valve disease. He was seen in my office February 2022 and c/o bilateral leg swelling with left worse than the right. Venous dopplers February 2022 with peroneal vein thrombosis. He was started on Eliquis.   He is here today for follow up. The patient denies any chest pain, dyspnea, palpitations, orthopnea, PND, dizziness, near syncope or syncope. Still with mild LE edema.    Primary Care Physician: [name], MD  Past Medical History:  Diagnosis Date  . BPH (benign prostatic hypertrophy)   . CAD (coronary artery disease) 01/2010   S/P drug eluting stents LAD and PL branch of RCA Dr. Olevia Perches  . follicular lymphoma dx'd 06/2017  . Hernia   . HLD (hyperlipidemia)   . HTN (hypertension)     Past Surgical History:  Procedure Laterality Date  . BACK SURGERY    . CYSTOSCOPY WITH LITHOLAPAXY N/A 05/20/2019   Procedure: CYSTOSCOPY WITH LITHOLAPAXY;  Surgeon: Ardis Hughs, MD;  Location: [facility];  Service: Urology;  Laterality: N/A;  . CYSTOSCOPY/URETEROSCOPY/HOLMIUM LASER/STENT PLACEMENT Left 05/20/2019   Procedure: CYSTOSCOPY/URETEROSCOPY/HOLMIUM  LASER/STENT PLACEMENT;  Surgeon: Ardis Hughs, MD;  Location: [facility];  Service: Urology;  Laterality: Left;  . HERNIA REPAIR  01/08/11   RIH  . MASS EXCISION Right 08/05/2017   Procedure: EXCISION RIGHT POSTERIOR  NECK LYMPH NODE;  Surgeon: Rozetta Nunnery, MD;  Location: [place];  Service: ENT;  Laterality: Right;    Current Outpatient Medications  Medication Sig Dispense Refill  . atenolol (TENORMIN) 25 MG tablet Take 1 tablet (25 mg total) by mouth daily. 90 tablet 3  . brimonidine (ALPHAGAN) 0.2 % ophthalmic solution Place 1 drop into the right eye 2 (two) times daily.    . cholecalciferol (VITAMIN D) 1000 UNITS tablet Take 1,000 Units by mouth daily.    . Cyanocobalamin (B-12) 2500 MCG TABS Take 2,500 mcg by mouth daily.    . finasteride (PROSCAR) 5 MG tablet Take 5 mg by mouth daily.    . furosemide (LASIX) 20 MG tablet Take 1 tablet (20 mg total) by mouth daily. 90 tablet 3  . isosorbide dinitrate (ISORDIL) 5 MG tablet Take 1 tablet (5 mg total) by mouth 2 (two) times daily. 180 tablet 3  . lisinopril (ZESTRIL) 5 MG tablet Take 5 mg by mouth daily.    . ondansetron (ZOFRAN) 8 MG tablet Take 1 tablet (8 mg total) by mouth every 8 (eight) hours as needed for nausea. 30 tablet 0  . potassium chloride SA (KLOR-CON) 20 MEQ tablet Take 20 mEq by mouth every other day.    . pravastatin (PRAVACHOL)  40 MG tablet Take 1 tablet (40 mg total) by mouth at bedtime. (Patient taking differently: Take 40 mg by mouth every Monday, Wednesday, and Friday.) 90 tablet 3  . timolol (TIMOPTIC) 0.5 % ophthalmic solution Place 1 drop into the right eye daily.    [name] apixaban (ELIQUIS) 5 MG TABS tablet Take 1 tablet (5 mg total) by mouth 2 (two) times daily. 180 tablet 0  . gabapentin (NEURONTIN) 100 MG capsule Take 100 mg by mouth daily as needed (knee pain).     . nitroGLYCERIN (NITROSTAT) 0.4 MG SL tablet Place 1 tablet (0.4 mg total) under the tongue every 5 (five) minutes as  needed for chest pain. 25 tablet 6   No current facility-administered medications for this visit.    No Known Allergies  Social History   Socioeconomic History  . Marital status: Married    Spouse name: Not on file  . Number of children: Not on file  . Years of education: Not on file  . Highest education level: Not on file  Occupational History  . Not on file  Tobacco Use  . Smoking status: Never Smoker  . Smokeless tobacco: Never Used  Vaping Use  . Vaping Use: Never used  Substance and Sexual Activity  . Alcohol use: No  . Drug use: No  . Sexual activity: Not on file  Other Topics Concern  . Not on file  Social History Narrative   Married   Social Determinants of Health   Financial Resource Strain: Not on file  Food Insecurity: Not on file  Transportation Needs: Not on file  Physical Activity: Not on file  Stress: Not on file  Social Connections: Not on file  Intimate Partner Violence: Not on file    Family History  Problem Relation Age of Onset  . Heart failure Mother     Review of Systems:  As stated in the HPI and otherwise negative.   BP 122/60   Pulse 75   Ht 5\' 9"  (1.753 m)   Wt 167 lb (75.8 kg)   SpO2 99%   BMI 24.66 kg/m   Physical Examination:  General: Well developed, well nourished, NAD  HEENT: OP clear, mucus membranes moist  SKIN: warm, dry. No rashes. Neuro: No focal deficits  Musculoskeletal: Muscle strength 5/5 all ext  Psychiatric: Mood and affect normal  Neck: No JVD, no carotid bruits, no thyromegaly, no lymphadenopathy.  Lungs:Clear bilaterally, no wheezes, rhonci, crackles Cardiovascular: Regular rate and rhythm. No murmurs, gallops or rubs. Abdomen:Soft. Bowel sounds present. Non-tender.  Extremities: Trace bilateral lower extremity edema. Pulses are 2 + in the bilateral DP/PT.  EKG:  EKG is not ordered today. The ekg ordered today demonstrates   Echo January 2020: 1. The left ventricle has normal systolic function of  42-59%. The cavity  size is normal. There is no left ventricular wall thickness. Echo evidence  of impaired relaxation diastolic filling patterns.  2. Mildly dilated left atrial size.  3. Normal right atrial size.  4. The mitral valve is degenerative. There is moderate thickening and  moderately calcified. Regurgitation is not visualized by color flow  Doppler.  5. Normal tricuspid valve.  6. The aortic valve tricuspid. There is moderate thickening and moderate  calcification of the aortic valve.  7. No atrial level shunt detected by color flow Doppler.   Recent Labs: 04/24/2020: Magnesium 1.8 05/15/2020: ALT 9; BUN 20; Creatinine 0.81; Hemoglobin 12.3; Platelets 215; Potassium 4.0; Sodium 140   Lipid Panel No  results found for: CHOL, TRIG, HDL, CHOLHDL, VLDL, LDLCALC, LDLDIRECT   Wt Readings from Last 3 Encounters:  06/05/20 167 lb (75.8 kg)  05/15/20 161 lb 11.2 oz (73.3 kg)  05/08/20 157 lb (71.2 kg)     Other studies Reviewed: Additional studies/ records that were reviewed today include:  Review of the above records demonstrates:    Assessment and Plan:   1. CAD without angina: No chest pain. Echo January 2020 with normal LV systolic function. Will continue ASA, statin, beta blocker and Imdur. Of note, he was a non-responder to Plavix.   2. HTN: BP is controlled. No changes  3. Hyperlipidemia: Lipids followed in primary care. Will continue statin  4. Acute on Chronic diastolic CHF: Weight is stable. Leg swelling is improved. Continue Lasix.  5. 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 Will stop in May 2022.      Current medicines are reviewed at length with the patient today.  The patient does not have concerns regarding medicines.  The following changes have been made:  no change  Labs/ tests ordered today include:   No orders of the defined types were placed in this encounter.   Disposition:   F/U with me in 6 months.   Signed, Lauree Chandler,  MD 06/05/2020 10:33 AM    [facility] [place], [place], [place]  [address] Phone: [phone]; Fax: [phone]

## 2020-06-20 ENCOUNTER — Telehealth (HOSPITAL_COMMUNITY): Payer: Self-pay

## 2020-06-20 NOTE — Telephone Encounter (Signed)
I called patient to follow up about a potential dental surgery with Dr. Benson Norway. Patient left original consult stating he was going to call his insurance company to find out what they would cover. Patient stated he did not know if insurance would pay, because it wasn't very good insurance. I explained to patient that Dr. Benson Norway is not in network with any ins company and he then stated, "Well, you need to listen to me, I am not interested." The patient then hung up.

## 2020-07-31 ENCOUNTER — Other Ambulatory Visit: Payer: Self-pay

## 2020-07-31 ENCOUNTER — Inpatient Hospital Stay: Payer: Medicare Other | Attending: Hematology

## 2020-07-31 ENCOUNTER — Ambulatory Visit (HOSPITAL_COMMUNITY)
Admission: RE | Admit: 2020-07-31 | Discharge: 2020-07-31 | Disposition: A | Discharge status: Home / Self Care | Payer: Medicare Other | Source: Ambulatory Visit | Attending: Hematology | Admitting: Hematology

## 2020-07-31 DIAGNOSIS — C8208 Follicular lymphoma grade I, lymph nodes of multiple sites: Secondary | ICD-10-CM

## 2020-07-31 DIAGNOSIS — Z79899 Other long term (current) drug therapy: Secondary | ICD-10-CM | POA: Insufficient documentation

## 2020-07-31 DIAGNOSIS — C8201 Follicular lymphoma grade I, lymph nodes of head, face, and neck: Secondary | ICD-10-CM | POA: Insufficient documentation

## 2020-07-31 DIAGNOSIS — Z5111 Encounter for antineoplastic chemotherapy: Secondary | ICD-10-CM | POA: Diagnosis not present

## 2020-07-31 LAB — CBC WITH DIFFERENTIAL/PLATELET
Abs Immature Granulocytes: 0.02 10*3/uL (ref 0.00–0.07)
Basophils Absolute: 0 10*3/uL (ref 0.0–0.1)
Basophils Relative: 0 %
Eosinophils Absolute: 0.1 10*3/uL (ref 0.0–0.5)
Eosinophils Relative: 1 %
HCT: 37.2 % — ABNORMAL LOW (ref 39.0–52.0)
Hemoglobin: 11.8 g/dL — ABNORMAL LOW (ref 13.0–17.0)
Immature Granulocytes: 0 %
Lymphocytes Relative: 19 %
Lymphs Abs: 1.3 10*3/uL (ref 0.7–4.0)
MCH: 27.4 pg (ref 26.0–34.0)
MCHC: 31.7 g/dL (ref 30.0–36.0)
MCV: 86.5 fL (ref 80.0–100.0)
Monocytes Absolute: 0.7 10*3/uL (ref 0.1–1.0)
Monocytes Relative: 10 %
Neutro Abs: 5 10*3/uL (ref 1.7–7.7)
Neutrophils Relative %: 70 %
Platelets: 209 10*3/uL (ref 150–400)
RBC: 4.3 MIL/uL (ref 4.22–5.81)
RDW: 13.8 % (ref 11.5–15.5)
WBC: 7.1 10*3/uL (ref 4.0–10.5)
nRBC: 0 % (ref 0.0–0.2)

## 2020-07-31 LAB — CMP (CANCER CENTER ONLY)
ALT: 7 U/L (ref 0–44)
AST: 13 U/L — ABNORMAL LOW (ref 15–41)
Albumin: 3.8 g/dL (ref 3.5–5.0)
Alkaline Phosphatase: 57 U/L (ref 38–126)
Anion gap: 6 (ref 5–15)
BUN: 18 mg/dL (ref 8–23)
CO2: 29 mmol/L (ref 22–32)
Calcium: 9.3 mg/dL (ref 8.9–10.3)
Chloride: 106 mmol/L (ref 98–111)
Creatinine: 0.83 mg/dL (ref 0.61–1.24)
GFR, Estimated: >60 mL/min (ref >60)
Glucose, Bld: 98 mg/dL (ref 70–99)
Potassium: 4.3 mmol/L (ref 3.5–5.1)
Sodium: 141 mmol/L (ref 135–145)
Total Bilirubin: 0.7 mg/dL (ref 0.3–1.2)
Total Protein: 6.4 g/dL — ABNORMAL LOW (ref 6.5–8.1)

## 2020-07-31 LAB — LACTATE DEHYDROGENASE: LDH: 148 U/L (ref 98–192)

## 2020-07-31 MED ORDER — IOHEXOL 9 MG/ML PO SOLN
ORAL | Status: AC
  Filled 2020-07-31: qty 1000

## 2020-07-31 MED ORDER — IOHEXOL 9 MG/ML PO SOLN
1000.0000 mL | ORAL | Status: AC
  Administered 2020-07-31: 1000 mL via ORAL

## 2020-08-04 NOTE — Progress Notes (Signed)
HEMATOLOGY/ONCOLOGY CLINIC NOTE  Date of Service: 08/07/2020  Patient Care Team: [name], MD as PCP - General (Family Medicine) [name], MD as PCP - Cardiology (Cardiology)  CHIEF COMPLAINTS/PURPOSE OF CONSULTATION:  mx of low grade Follicular Lymphoma  HISTORY OF PRESENTING ILLNESS:   Harold [name] is a wonderful 86 y.o. male who has been referred to [place] by Dr [name] for evaluation and management of Follicular Lymphoma. He is accompanied today by his sister. The pt reports that he is doing well overall.   The pt reports first noticing the rt neck mass develop in his neck about a year ago and saw his PCP Dr [name].  The mass was getting larger and so he was referred to Dr [name] for further evaluation and had a biopsy of the rt upper neck mass which showed Grade 1-2 follicular lymphoma. pt has had Tissue flow cytometry completed on 08/05/17 with results revealing A LAMBDA-RESTRICTED MONOCLONAL B-CELL POPULATION EXPRESSING CD10 COMPRISES 39% OF ALL LYMPHOCYTES.  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 He denies feeling any differently now than he did a year ago. He takes daily 81mg  aspirin which he notes has led to some mild increased bruising.   Of note prior to the patient's visit today,  He notes that he has had a couple spots of melanoma on his chin and back about 5 and 8 years ago. These were subsequently removed. He has had a basal cell as well and has been treated several times with liquid nitrogen.   On review of systems, pt reports stable energy levels, some bruising, stable bowel habits, and denies fevers, chills, night sweats, unexpected weight loss, pain along the spine, abdominal pains, testicular pain or swelling, and any other symptoms.   On PMHx the pt reports melanoma, CAD,  HTN, HLD, cataract in left eye.   INTERVAL HISTORY:  Harold [name] returns today for management and evaluation of his follicular lymphoma and his second of four doses of weekly Rituxan. The patient's last visit with [place] was on 05/15/2020. The pt reports that he is doing well overall.  The pt reports that he was doing well until the dye from the CT scan. The pt notes severe diarrhea due to this for days that was very bothersome to the pt. The pt notes he was on the toilet for 45 minutes at a time. He notes they failed to get his chest and called him back a few days later. He could not go back due to his wife's dental appointment. He notes much improvement in his leg swelling. The pt is still on 40 mg Lasix.   Of note since the patient's last visit, pt has had CT C/A/P ([id number]) on 07/31/2020, which revealed "1. Interval response to therapy with significant improvement in the previously demonstrated axillary, retroperitoneal, pelvic and inguinal lymphadenopathy. No progressive disease identified. 2. The spleen is normal in size without focal abnormality. No solid parenchymal organ involvement seen. 3. Stable sequela of chronic calcific pancreatitis. 4. Stable linear dependent calcifications within the bladder, indeterminate for small layering calculi versus bladder wall calcification. No apparent associated soft tissue mass. 5. Coronary and Aortic Atherosclerosis (ICD10-I70.0)."  Lab results today 08/07/2020 of CBC w/diff and CMP is as follows: all values are WNL except for Hgb of 12.4, HCT of 38.7, AST  of 13.  On review of systems, pt reports severe diarrhea due to CT dye and denies worsening leg swelling and any other symptoms.  MEDICAL HISTORY:  Past Medical History:  Diagnosis Date  . BPH (benign prostatic hypertrophy)   . CAD (coronary artery disease) 01/2010   S/P drug eluting stents LAD and PL branch of RCA Dr. Olevia Perches  . follicular lymphoma dx'd 06/2017  . Hernia   . HLD (hyperlipidemia)    . HTN (hypertension)     SURGICAL HISTORY: Past Surgical History:  Procedure Laterality Date  . BACK SURGERY    . CYSTOSCOPY WITH LITHOLAPAXY N/A 05/20/2019   Procedure: CYSTOSCOPY WITH LITHOLAPAXY;  Surgeon: Ardis Hughs, MD;  Location: [facility];  Service: Urology;  Laterality: N/A;  . CYSTOSCOPY/URETEROSCOPY/HOLMIUM LASER/STENT PLACEMENT Left 05/20/2019   Procedure: CYSTOSCOPY/URETEROSCOPY/HOLMIUM LASER/STENT PLACEMENT;  Surgeon: Ardis Hughs, MD;  Location: [facility];  Service: Urology;  Laterality: Left;  . HERNIA REPAIR  01/08/11   RIH  . MASS EXCISION Right 08/05/2017   Procedure: EXCISION RIGHT POSTERIOR  NECK LYMPH NODE;  Surgeon: Rozetta Nunnery, MD;  Location: [facility];  Service: ENT;  Laterality: Right;    SOCIAL HISTORY: Social History   Socioeconomic History  . Marital status: Married    Spouse name: Not on file  . Number of children: Not on file  . Years of education: Not on file  . Highest education level: Not on file  Occupational History  . Not on file  Tobacco Use  . Smoking status: Never Smoker  . Smokeless tobacco: Never Used  Vaping Use  . Vaping Use: Never used  Substance and Sexual Activity  . Alcohol use: No  . Drug use: No  . Sexual activity: Not on file  Other Topics Concern  . Not on file  Social History Narrative   Married   Social Determinants of Health   Financial Resource Strain: Not on file  Food Insecurity: Not on file  Transportation Needs: Not on file  Physical Activity: Not on file  Stress: Not on file  Social Connections: Not on file  Intimate Partner Violence: Not on file    FAMILY HISTORY: Family History  Problem Relation Age of Onset  . Heart failure Mother     ALLERGIES:  has No Known Allergies.  MEDICATIONS:  Current Outpatient Medications  Medication Sig Dispense Refill  . apixaban (ELIQUIS) 5 MG TABS tablet Take 1 tablet (5 mg total) by mouth 2 (two) times daily. 180 tablet 0  .  atenolol (TENORMIN) 25 MG tablet Take 1 tablet (25 mg total) by mouth daily. 90 tablet 3  . brimonidine (ALPHAGAN) 0.2 % ophthalmic solution Place 1 drop into the right eye 2 (two) times daily.    . cholecalciferol (VITAMIN D) 1000 UNITS tablet Take 1,000 Units by mouth daily.    . Cyanocobalamin (B-12) 2500 MCG TABS Take 2,500 mcg by mouth daily.    . finasteride (PROSCAR) 5 MG tablet Take 5 mg by mouth daily.    . furosemide (LASIX) 20 MG tablet Take 1 tablet (20 mg total) by mouth daily. 90 tablet 3  . gabapentin (NEURONTIN) 100 MG capsule Take 100 mg by mouth daily as needed (knee pain).     . isosorbide dinitrate (ISORDIL) 5 MG tablet Take 1 tablet (5 mg total) by mouth 2 (two) times daily. 180 tablet 3  . lisinopril (ZESTRIL) 5 MG tablet Take 5 mg by mouth daily.    . nitroGLYCERIN (NITROSTAT)  0.4 MG SL tablet Place 1 tablet (0.4 mg total) under the tongue every 5 (five) minutes as needed for chest pain. 25 tablet 6  . ondansetron (ZOFRAN) 8 MG tablet Take 1 tablet (8 mg total) by mouth every 8 (eight) hours as needed for nausea. 30 tablet 0  . potassium chloride SA (KLOR-CON) 20 MEQ tablet Take 20 mEq by mouth every other day.    . pravastatin (PRAVACHOL) 40 MG tablet Take 1 tablet (40 mg total) by mouth at bedtime. (Patient taking differently: Take 40 mg by mouth every Monday, Wednesday, and Friday.) 90 tablet 3  . timolol (TIMOPTIC) 0.5 % ophthalmic solution Place 1 drop into the right eye daily.     No current facility-administered medications for this visit.    REVIEW OF SYSTEMS:   10 Point review of Systems was done is negative except as noted above.  PHYSICAL EXAMINATION: ECOG FS:2 - Symptomatic, <50% confined to bed  Vitals:   08/07/20 1038  BP: (!) 148/64  Pulse: 62  Resp: 17  Temp: 98.2 F (36.8 C)  SpO2: 100%   Wt Readings from Last 3 Encounters:  08/07/20 159 lb 6.4 oz (72.3 kg)  06/05/20 167 lb (75.8 kg)  05/15/20 161 lb 11.2 oz (73.3 kg)   Body mass index is  23.54 kg/m.    Exam was given in a chair.   GENERAL:alert, in no acute distress and comfortable SKIN: no acute rashes, no significant lesions EYES: conjunctiva are pink and non-injected, sclera anicteric OROPHARYNX: MMM, no exudates, no oropharyngeal erythema or ulceration NECK: supple, no JVD LYMPH:  no palpable lymphadenopathy in the cervical, axillary or inguinal regions LUNGS: clear to auscultation b/l with normal respiratory effort HEART: regular rate & rhythm ABDOMEN:  normoactive bowel sounds , non tender, not distended. Extremity: no pedal edema PSYCH: alert & oriented x 3 with fluent speech NEURO: no focal motor/sensory deficits  LABORATORY DATA:  I have reviewed the data as listed  CBC Latest Ref Rng & Units 08/07/2020 07/31/2020 05/15/2020  WBC 4.0 - 10.5 K/uL 6.6 7.1 8.3  Hemoglobin 13.0 - 17.0 g/dL 12.4(L) 11.8(L) 12.3(L)  Hematocrit 39.0 - 52.0 % 38.7(L) 37.2(L) 38.9(L)  Platelets 150 - 400 K/uL 222 209 215    CMP Latest Ref Rng & Units 08/07/2020 07/31/2020 05/15/2020  Glucose 70 - 99 mg/dL 91 98 93  BUN 8 - 23 mg/dL 19 18 20   Creatinine 0.61 - 1.24 mg/dL 0.78 0.83 0.81  Sodium 135 - 145 mmol/L 142 141 140  Potassium 3.5 - 5.1 mmol/L 3.9 4.3 4.0  Chloride 98 - 111 mmol/L 107 106 105  CO2 22 - 32 mmol/L 27 29 27   Calcium 8.9 - 10.3 mg/dL 9.3 9.3 9.1  Total Protein 6.5 - 8.1 g/dL 6.6 6.4(L) 6.5  Total Bilirubin 0.3 - 1.2 mg/dL 1.0 0.7 0.6  Alkaline Phos 38 - 126 U/L 61 57 67  AST 15 - 41 U/L 13(L) 13(L) 12(L)  ALT 0 - 44 U/L 9 7 9   uric acid 6.4  Lab Results  Component Value Date   LDH 148 07/31/2020     02/18/2019 CT Abdomen Pelvis W Contrast (Accession [id number]):   02/18/2019 CT Chest W Contrast (Accession [id number]):  08/08/17 Pathology:   08/05/17 Pathology:     RADIOGRAPHIC STUDIES: I have personally reviewed the radiological images as listed and agreed with the findings in the report. CT CHEST ABDOMEN PELVIS WO CONTRAST  Result Date:  08/02/2020 CLINICAL DATA:  Ongoing chemotherapy for follicular  lymphoma. Assess treatment response. EXAM: CT CHEST, ABDOMEN AND PELVIS WITHOUT CONTRAST TECHNIQUE: Multidetector CT imaging of the chest, abdomen and pelvis was performed following the standard protocol without IV contrast. Imaging of the abdomen and pelvis was performed on 07/31/2020. In a vertically, the chest was not imaged at that time. The patient was returned for repeat imaging of the chest on 08/02/2020. COMPARISON:  Prior CT's 04/13/2020, 10/01/2019 and 02/18/2019. FINDINGS: CT CHEST FINDINGS Cardiovascular: Extensive coronary artery atherosclerosis with lesser involvement of the aorta and great vessels. Probable calcifications of the aortic valve. The heart size is normal. There is no pericardial effusion. Mediastinum/Nodes: Interval improvement in previously demonstrated axillary and subpectoral adenopathy bilaterally. The largest right axillary node has a short axis dimension of 11 mm on image 39/2 (previously 19 mm). The largest left axillary node has a short axis diameter of 15 mm on image 38/2 (previously 28 mm). No enlarging lymph nodes identified. No enlarged mediastinal or hilar lymph nodes are seen. There are small calcified mediastinal lymph nodes. Hilar assessment is limited by the lack of intravenous contrast, although the hilar contours appear unchanged. The thyroid gland, trachea and esophagus demonstrate no significant findings. Lungs/Pleura: No pleural effusion or pneumothorax. Stable mild subpleural reticulation and scarring in both lungs. No suspicious pulmonary nodules. Musculoskeletal/Chest wall: No chest wall mass or suspicious osseous findings. Bilateral glenohumeral joint effusions with associated chondrocalcinosis. Mild thoracic spondylosis. CT ABDOMEN AND PELVIS FINDINGS Hepatobiliary: No focal hepatic abnormalities on noncontrast imaging. No evidence of gallstones, gallbladder wall thickening or biliary dilatation.  Pancreas: Diffuse parenchymal calcifications throughout the pancreas consistent with chronic pancreatitis. No evidence of pancreatic ductal dilatation or surrounding inflammation. Spleen: Normal in size without focal abnormality. Adrenals/Urinary Tract: Both adrenal glands appear normal. The kidneys appear stable with extrarenal pelves bilaterally. No evidence of renal or ureteral calculus or perinephric soft tissue stranding. Stable linear calcification along the posterior wall of the bladder without apparent associated soft tissue mass. Based on prior imaging, this could reflect dependent small calculi, although bladder wall calcification could have this appearance. Stomach/Bowel: Enteric contrast was administered and has passed into the right colon. The stomach appears unremarkable for its degree of distension. No evidence of bowel wall thickening, distention or surrounding inflammatory change. Diverticular changes are present throughout the colon. Vascular/Lymphatic: Interval improvement in previously demonstrated retroperitoneal, pelvic and inguinal lymphadenopathy. Index aortocaval node measures 7 mm on image 31/2 (previously 10 mm). Right inguinal node has a short axis dimension of 2.1 cm on image 72/2 (previously 4.1 cm). No progressive adenopathy identified. Aortic and branch vessel atherosclerosis without aneurysm. Reproductive: The prostate gland and seminal vesicles appear stable. Other: No evidence of abdominal wall mass or hernia. No ascites. Musculoskeletal: No acute or significant osseous findings. Multilevel lumbar spondylosis. Mild degenerative changes of the hips with associated chondrocalcinosis. IMPRESSION: 1. Interval response to therapy with significant improvement in the previously demonstrated axillary, retroperitoneal, pelvic and inguinal lymphadenopathy. No progressive disease identified. 2. The spleen is normal in size without focal abnormality. No solid parenchymal organ involvement  seen. 3. Stable sequela of chronic calcific pancreatitis. 4. Stable linear dependent calcifications within the bladder, indeterminate for small layering calculi versus bladder wall calcification. No apparent associated soft tissue mass. 5. Coronary and Aortic Atherosclerosis (ICD10-I70.0). Electronically Signed   By: Richardean Sale M.D.   On: 08/02/2020 08:57    ASSESSMENT & PLAN:   86 y.o. male with  1.h/o  atleast Stage III Follicular Lymphoma Grade 1-2 presenting with rt cervical lymphadenopathy  08/05/17 pathology results of right cervical LN revealing low grade follicular lymphoma  1/61/09 PET revealed Hypermetabolic lymphadenopathy involving the right neck, bilateral axillary, right hilar and right inguinal lymph nodes. No abdominal or pelvic involvement.    03/20/18 CT C/A/P revealed Progressive bilateral axillary and right inguinal lymphadenopathy, including necrotic lymph nodes in the right axilla, as above. Spleen is normal in size. 4 mm distal left ureteral calculus, unchanged. No hydronephrosis. Stable layering bladder calculi measuring up to 5 mm. Cholelithiasis, without associated inflammatory changes. Stable sequela of prior/chronic pancreatitis.   11/03/2018 CT neck revealed "Marked interval progression of cervical lymphadenopathy since PET-CT 09/02/2017, now with lymphadenopathy present bilaterally at essentially all stations. Index nodes as described. This includes bilateral intraparotid lymphadenopathy. Bulky right-sided adenopathy results in multifocal narrowing of the right internal jugular vein. Significant narrowing of the mid to lower left internal jugular vein of unclear cause as there is no significant mass effect at this level."  11/03/2018 CT CAP revealed "1. Progression of bilateral axillary, retroperitoneal, pelvic, and inguinal adenopathy. New anterior and posterior lower cervical adenopathy identified within the neck. 2. Persistent distal left ureteral calculus. Small  bladder calculi are unchanged. 3. Aortic Atherosclerosis (ICD10-I70.0). Coronary artery calcifications. 4. Sequelae of chronic pancreatitis noted."  10/01/2019 CT Abd/Pel ([id number]) revealed no evidence of mass in right kidney, new and enlarged abdominal and pelvic lymph nodes.   2. 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 3. H/o head and neck Basal cell carcinoma Continue routine skin checks with dermatologist    4. Renal indeterminate lesion -06/28/19 Renal US ([id number]) revealed New right-sided mass at the hilum which measures 1.5cm in size.   -resolved on subsequent scans  5. Urinary stos in ureter and bladder.   PLAN: -Discussed pt labwork today, 08/07/2020; blood counts and chemistries normal.  -Discussed pt CT C/A/P ([id number]) on 07/31/2020; remarkable response to treatment. All lymph nodes much decreased in size with no new findings. -Advised pt we will continue with maintenance Rituxan q77months for a period of one year to get as deep a response as possible. -Discussed side effects of halting treatment-- bothering the pt's current independence and way of life. Advised pt this is very slow growing. -Advised pt that we may have him drink 1/4 bottle next time or hold the contrast.  -Recommended pt receive the second COVID booster shot as recently approved. The pt has received this. -Discussed [place] and pt's eligibility. Will hold referral. -The pt has no prohibitive toxicities from continuing Rituxan at this time.  -Recommended pt continue to eat well and drink 48-64 oz water daily.  -Continue Multivitamin, Vitamin D, and B12 daily.  -Continue Lasix at 40 mg daily, one in morning and one at lunch time. -Will see back in 2 months with labs.   FOLLOW UP:  Please schedule next 2 cycles of maintenance Rituxan every 2 months with labs and MD visits.   The total time spent in the appointment was 30 minutes and more than 50% was on counseling and direct patient cares.   All of the  patient's questions were answered with apparent satisfaction. The patient knows to call the clinic with any problems, questions or concerns.   Sullivan Lone MD [place] AAHIVMS [facility] Hematology/Oncology Physician [facility]  (Office):       [phone] (Work cell):  [phone] (Fax):           [phone]  08/07/2020 11:32 AM  I, Reinaldo Raddle, am acting as scribe for Dr. Sullivan Lone,  MD. .I have reviewed the above documentation for accuracy and completeness, and I agree with the above. Brunetta Genera MD

## 2020-08-06 ENCOUNTER — Other Ambulatory Visit: Payer: Self-pay | Admitting: Cardiovascular Disease

## 2020-08-07 ENCOUNTER — Inpatient Hospital Stay: Payer: Medicare Other

## 2020-08-07 ENCOUNTER — Other Ambulatory Visit: Payer: Self-pay

## 2020-08-07 ENCOUNTER — Inpatient Hospital Stay: Payer: Medicare Other | Admitting: Hematology

## 2020-08-07 VITALS — BP 148/64 | HR 62 | Temp 98.2°F | Resp 17 | Ht 69.0 in | Wt 159.4 lb

## 2020-08-07 VITALS — BP 135/62 | HR 56 | Temp 97.9°F | Resp 17

## 2020-08-07 DIAGNOSIS — C8208 Follicular lymphoma grade I, lymph nodes of multiple sites: Secondary | ICD-10-CM

## 2020-08-07 DIAGNOSIS — Z5112 Encounter for antineoplastic immunotherapy: Secondary | ICD-10-CM

## 2020-08-07 DIAGNOSIS — Z5111 Encounter for antineoplastic chemotherapy: Secondary | ICD-10-CM | POA: Diagnosis not present

## 2020-08-07 DIAGNOSIS — Z7189 Other specified counseling: Secondary | ICD-10-CM

## 2020-08-07 LAB — CBC WITH DIFFERENTIAL/PLATELET
Abs Immature Granulocytes: 0.02 10*3/uL (ref 0.00–0.07)
Basophils Absolute: 0 10*3/uL (ref 0.0–0.1)
Basophils Relative: 0 %
Eosinophils Absolute: 0.1 10*3/uL (ref 0.0–0.5)
Eosinophils Relative: 1 %
HCT: 38.7 % — ABNORMAL LOW (ref 39.0–52.0)
Hemoglobin: 12.4 g/dL — ABNORMAL LOW (ref 13.0–17.0)
Immature Granulocytes: 0 %
Lymphocytes Relative: 28 %
Lymphs Abs: 1.9 10*3/uL (ref 0.7–4.0)
MCH: 27.7 pg (ref 26.0–34.0)
MCHC: 32 g/dL (ref 30.0–36.0)
MCV: 86.6 fL (ref 80.0–100.0)
Monocytes Absolute: 0.6 10*3/uL (ref 0.1–1.0)
Monocytes Relative: 8 %
Neutro Abs: 4.1 10*3/uL (ref 1.7–7.7)
Neutrophils Relative %: 63 %
Platelets: 222 10*3/uL (ref 150–400)
RBC: 4.47 MIL/uL (ref 4.22–5.81)
RDW: 13.6 % (ref 11.5–15.5)
WBC: 6.6 10*3/uL (ref 4.0–10.5)
nRBC: 0 % (ref 0.0–0.2)

## 2020-08-07 LAB — CMP (CANCER CENTER ONLY)
ALT: 9 U/L (ref 0–44)
AST: 13 U/L — ABNORMAL LOW (ref 15–41)
Albumin: 3.9 g/dL (ref 3.5–5.0)
Alkaline Phosphatase: 61 U/L (ref 38–126)
Anion gap: 8 (ref 5–15)
BUN: 19 mg/dL (ref 8–23)
CO2: 27 mmol/L (ref 22–32)
Calcium: 9.3 mg/dL (ref 8.9–10.3)
Chloride: 107 mmol/L (ref 98–111)
Creatinine: 0.78 mg/dL (ref 0.61–1.24)
GFR, Estimated: >60 mL/min (ref >60)
Glucose, Bld: 91 mg/dL (ref 70–99)
Potassium: 3.9 mmol/L (ref 3.5–5.1)
Sodium: 142 mmol/L (ref 135–145)
Total Bilirubin: 1 mg/dL (ref 0.3–1.2)
Total Protein: 6.6 g/dL (ref 6.5–8.1)

## 2020-08-07 MED ORDER — METHYLPREDNISOLONE SODIUM SUCC 125 MG IJ SOLR
INTRAMUSCULAR | Status: AC
  Filled 2020-08-07: qty 2

## 2020-08-07 MED ORDER — DIPHENHYDRAMINE HCL 25 MG PO CAPS
ORAL_CAPSULE | ORAL | Status: AC
  Filled 2020-08-07: qty 2

## 2020-08-07 MED ORDER — FAMOTIDINE 20 MG IN NS 100 ML IVPB
20.0000 mg | Freq: Once | INTRAVENOUS | Status: AC
Start: 2020-08-07 — End: 2020-08-07
  Administered 2020-08-07: 20 mg via INTRAVENOUS

## 2020-08-07 MED ORDER — FAMOTIDINE 20 MG IN NS 100 ML IVPB
INTRAVENOUS | Status: AC
  Filled 2020-08-07: qty 100

## 2020-08-07 MED ORDER — ACETAMINOPHEN 325 MG PO TABS
650.0000 mg | ORAL_TABLET | Freq: Once | ORAL | Status: AC
  Administered 2020-08-07: 650 mg via ORAL

## 2020-08-07 MED ORDER — SODIUM CHLORIDE 0.9 % IV SOLN
375.0000 mg/m2 | Freq: Once | INTRAVENOUS | Status: AC
  Administered 2020-08-07: 700 mg via INTRAVENOUS
  Filled 2020-08-07: qty 50

## 2020-08-07 MED ORDER — DIPHENHYDRAMINE HCL 25 MG PO CAPS
50.0000 mg | ORAL_CAPSULE | Freq: Once | ORAL | Status: AC
  Administered 2020-08-07: 50 mg via ORAL

## 2020-08-07 MED ORDER — ACETAMINOPHEN 325 MG PO TABS
ORAL_TABLET | ORAL | Status: AC
  Filled 2020-08-07: qty 2

## 2020-08-07 MED ORDER — SODIUM CHLORIDE 0.9 % IV SOLN
Freq: Once | INTRAVENOUS | Status: AC
  Filled 2020-08-07: qty 250

## 2020-08-07 MED ORDER — METHYLPREDNISOLONE SODIUM SUCC 125 MG IJ SOLR
125.0000 mg | Freq: Once | INTRAMUSCULAR | Status: AC
  Administered 2020-08-07: 125 mg via INTRAVENOUS

## 2020-08-07 NOTE — Patient Instructions (Signed)
[facility] MEDICAL ONCOLOGY   Discharge Instructions: Thank you for choosing [facility] to provide your oncology and hematology care.   If you have a lab appointment with the Cancer Center, please go directly to the Cancer Center and check in at the registration area.   Wear comfortable clothing and clothing appropriate for easy access to any Portacath or PICC line.   We strive to give you quality time with your provider. You may need to reschedule your appointment if you arrive late (15 or more minutes).  Arriving late affects you and other patients whose appointments are after yours.  Also, if you miss three or more appointments without notifying the office, you may be dismissed from the clinic at the provider's discretion.      For prescription refill requests, have your pharmacy contact our office and allow 72 hours for refills to be completed.    Today you received the following chemotherapy and/or immunotherapy agents: rituximab.      To help prevent nausea and vomiting after your treatment, we encourage you to take your nausea medication as directed.  BELOW ARE SYMPTOMS THAT SHOULD BE REPORTED IMMEDIATELY: *FEVER GREATER THAN 100.4 F (38 C) OR HIGHER *CHILLS OR SWEATING *NAUSEA AND VOMITING THAT IS NOT CONTROLLED WITH YOUR NAUSEA MEDICATION *UNUSUAL SHORTNESS OF BREATH *UNUSUAL BRUISING OR BLEEDING *URINARY PROBLEMS (pain or burning when urinating, or frequent urination) *BOWEL PROBLEMS (unusual diarrhea, constipation, pain near the anus) TENDERNESS IN MOUTH AND THROAT WITH OR WITHOUT PRESENCE OF ULCERS (sore throat, sores in mouth, or a toothache) UNUSUAL RASH, SWELLING OR PAIN  UNUSUAL VAGINAL DISCHARGE OR ITCHING   Items with * indicate a potential emergency and should be followed up as soon as possible or go to the Emergency Department if any problems should occur.  Please show the CHEMOTHERAPY ALERT CARD or IMMUNOTHERAPY ALERT CARD at check-in  to the Emergency Department and triage nurse.  Should you have questions after your visit or need to cancel or reschedule your appointment, please contact [facility]COL[facility]  Dept: [phone]  and follow the prompts.  Office hours are 8:00 a.m. to 4:30 p.m. Monday - Friday. Please note that voicemails left after 4:00 p.m. may not be returned until the following business day.  We are closed weekends and major holidays. You have access to a nurse at all times for urgent questions. Please call the main number to the clinic Dept: [phone] and follow the prompts.   For any non-urgent questions, you may also contact your provider using MyChart. We now offer e-Visits for anyone 18 and older to request care online for non-urgent symptoms. For details visit [email or web address].   Also download the MyChart app! Go to the app store, search "MyChart", open the app, select [facility], and log in with your MyChart username and password.  Due to Covid, a mask is required upon entering the hospital/clinic. If you do not have a mask, one will be given to you upon arrival. For doctor visits, patients may have 1 support person aged 18 or older with them. For treatment visits, patients cannot have anyone with them due to current Covid guidelines and our immunocompromised population.   

## 2020-08-07 NOTE — Telephone Encounter (Signed)
Reviewed with Dr. Angelena Form.  Pt with peroneal DVT, needed 3 month course of Eliquis.  No refill needed.   I spoke with the patient's daughter and informed we are not refilling and that once pt completes what is in his current bottle he can stop Eliquis. - med was filled in March for 90 day supply.  She voices understanding and will tell the patient.

## 2020-08-07 NOTE — Telephone Encounter (Addendum)
Eliquis 5mg  refill request received. Patient is 86 years old, weight-75.8kg, Crea-0.83 on 07/31/20, Diagnosis-Peroneal DVT, and last seen by Dr. Angelena Form on 06/05/2020; per OV note on Eliquis 3 month course, will stop in May 2022. Will need to clarify stop date since this is May 2022. Last sent in March for 3 month supply. Dose is appropriate based on dosing criteria.

## 2020-08-13 ENCOUNTER — Encounter: Payer: Self-pay | Admitting: Hematology

## 2020-10-07 NOTE — Progress Notes (Signed)
HEMATOLOGY/ONCOLOGY CLINIC NOTE  Date of Service: 10/07/2020  Patient Care Team: Harold Every, MD as PCP - General (Family Medicine) Harold Blanks, MD as PCP - Cardiology (Cardiology)  CHIEF COMPLAINTS/PURPOSE OF CONSULTATION:  mx of low grade Follicular Lymphoma  HISTORY OF PRESENTING ILLNESS:   Harold [name] is Harold wonderful 86 y.o. male who has been referred to [place] by Harold [name] for evaluation and management of Follicular Lymphoma. He is accompanied today by his sister. The pt reports that he is doing well overall.   The pt reports first noticing the rt neck mass develop in his neck about Harold year ago and saw his PCP Harold Harold [name].  The mass was getting larger and so he was referred to Harold [name] for further evaluation and had Harold biopsy of the rt upper neck mass which showed Grade 1-2 follicular lymphoma. pt has had Tissue flow cytometry completed on 08/05/17 with results revealing Harold [name].  Pathology result showed  Lymph node for lymphoma, Harold [name]  He notes that he functions independently and is active with keeping care of his property. He denies feeling any differently now than he did Harold year ago. He takes daily '81mg'$  aspirin which he notes has led to some mild increased bruising.   Of note prior to the patient's visit today,  He notes that he has had Harold couple spots of melanoma on his chin and back about 5 and 8 years ago. These were subsequently removed. He has had Harold basal cell as well and has been treated several times with liquid nitrogen.   On review of systems, pt reports stable energy levels, some bruising, stable bowel habits, and denies fevers, chills, night sweats, unexpected weight loss, pain along the spine, abdominal pains, testicular pain or swelling, and any other symptoms.   On PMHx the pt reports melanoma, CAD,  HTN, HLD, cataract in left eye.   INTERVAL HISTORY:   Harold [name] returns today for management and evaluation of his follicular lymphoma and next dose of maintenance Rituxan. The patient's last visit with [place] was on 08/07/2020. The pt reports that he is doing well overall.  The pt reports he is feeling the best that he has felt in Harold while.  Notes his leg swelling is better.  Talks about Harold plumbing issue in his house which he is getting fixed. No infection issues.  No fevers no chills no night sweats.  Notes that his lymph nodes have continued to shrink significantly. No significant treatment toxicities from his last dose of Rituxan.  Lab results today 10/09/2020 reviewed with the patient .  On review of systems, pt reports no other acute new symptoms.  Continues to have some stress related to his wife's dementia. He is agreeable to getting Evusheld today.  Counseled about the pros and cons of this and answered his questions.  MEDICAL HISTORY:  Past Medical History:  Diagnosis Date   BPH (benign prostatic hypertrophy)    CAD (coronary artery disease) 01/2010   S/P drug eluting stents LAD and PL branch of RCA Harold [name]   follicular lymphoma dx'd 06/2017   Hernia    HLD (hyperlipidemia)    HTN (hypertension)     SURGICAL HISTORY: Past Surgical History:  Procedure Laterality Date   BACK SURGERY     CYSTOSCOPY WITH LITHOLAPAXY N/Harold [name]/06/2019   Procedure: CYSTOSCOPY WITH LITHOLAPAXY;  Surgeon: Harold Hughs, MD;  Location: [facility];  Service: Urology;  Laterality: N/Harold;   CYSTOSCOPY/URETEROSCOPY/HOLMIUM LASER/STENT PLACEMENT Left [name]/06/2019   Procedure: CYSTOSCOPY/URETEROSCOPY/HOLMIUM LASER/STENT PLACEMENT;  Surgeon: Harold Hughs, MD;  Location: [facility];  Service: Urology;  Laterality: Left;   HERNIA REPAIR  01/08/11   RIH   MASS EXCISION Right 08/05/2017   Procedure: EXCISION RIGHT Harold  NECK LYMPH NODE;  Surgeon: Harold Nunnery, MD;  Location: [facility];  Service: ENT;  Laterality: Right;    SOCIAL HISTORY: Social History   Socioeconomic History   Marital status: Married    Spouse name: Not on file   Number of children: Not on file   Years of education: Not on file   Highest education level: Not on file  Occupational History   Not on file  Tobacco Use   Smoking status: Never   Smokeless tobacco: Never  Vaping Use   Vaping Use: Never used  Substance and Sexual Activity   Alcohol use: No   Drug use: No   Sexual activity: Not on file  Other Topics Concern   Not on file  Social History Narrative   Married   Social Determinants of Health   Financial Resource Strain: Not on file  Food Insecurity: Not on file  Transportation Needs: Not on file  Physical Activity: Not on file  Stress: Not on file  Social Connections: Not on file  Intimate Partner Violence: Not on file    FAMILY HISTORY: Family History  Problem Relation Age of Onset   Heart failure Mother     ALLERGIES:  has No Known Allergies.  MEDICATIONS:  Current Outpatient Medications  Medication Sig Dispense Refill   apixaban (ELIQUIS) 5 MG TABS tablet Take 1 tablet (5 mg total) by mouth 2 (two) times daily. 180 tablet 0   atenolol (TENORMIN) 25 MG tablet Take 1 tablet (25 mg total) by mouth daily. 90 tablet [name]   brimonidine (ALPHAGAN) 0.2 % ophthalmic solution Place 1 drop into the right eye 2 (two) times daily.     cholecalciferol (VITAMIN D) 1000 UNITS tablet Take 1,000 Units by mouth daily.     Cyanocobalamin (B-12) 2500 MCG TABS Take 2,500 mcg by mouth daily.     finasteride (PROSCAR) 5 MG tablet Take 5 mg by mouth daily.     furosemide (LASIX) 20 MG tablet Take 1 tablet (20 mg total) by mouth daily. 90 tablet [name]   gabapentin (NEURONTIN) 100 MG capsule Take 100 mg by mouth daily as needed (knee pain).      isosorbide dinitrate (ISORDIL) 5 MG tablet Take 1 tablet (5 mg total) by mouth 2 (two) times daily. 180 tablet [name]   lisinopril (ZESTRIL) 5 MG  tablet Take 5 mg by mouth daily.     nitroGLYCERIN (NITROSTAT) 0.4 MG SL tablet Place 1 tablet (0.4 mg total) under the tongue [name] 5 (five) minutes as needed for chest pain. 25 tablet 6   ondansetron (ZOFRAN) 8 MG tablet Take 1 tablet (8 mg total) by mouth [name] 8 (eight) hours as needed for nausea. 30 tablet 0   potassium chloride SA (KLOR-CON) 20 MEQ tablet Take 20 mEq by mouth [name] other day.     pravastatin (PRAVACHOL) 40 MG tablet Take 1 tablet (40 mg total) by mouth at bedtime. (Patient taking differently: Take 40 mg by mouth [name] Monday, Wednesday, and Friday.) 90 tablet [name]   timolol (TIMOPTIC) 0.5 % ophthalmic solution Place 1 drop into the  right eye daily.     No current facility-administered medications for this visit.    REVIEW OF SYSTEMS:   10 Point review of Systems was done is negative except as noted above.  PHYSICAL EXAMINATION: ECOG FS:2 - Symptomatic, <50% confined to bed  There were no vitals filed for this visit.  Wt Readings from Last [name] Encounters:  08/07/20 159 lb 6.4 oz (72.[name] kg)  06/05/20 167 lb (75.8 kg)  05/15/20 161 lb 11.2 oz (73.[name] kg)   There is no height or weight on file to calculate BMI.    NAD GENERAL:alert, in no acute distress and comfortable SKIN: no acute rashes, no significant lesions EYES: conjunctiva are pink and non-injected, sclera anicteric OROPHARYNX: MMM, no exudates, no oropharyngeal erythema or ulceration NECK: supple, no JVD LYMPH:  no palpable lymphadenopathy in the cervical, axillary or inguinal regions LUNGS: clear to auscultation b/l with normal respiratory effort HEART: regular rate & rhythm ABDOMEN:  normoactive bowel sounds , non tender, not distended. Extremity: no pedal edema PSYCH: alert & oriented x [name] with fluent speech NEURO: no focal motor/sensory deficits  LABORATORY DATA:  I have reviewed the data as listed  CBC Latest Ref Rng & Units 10/09/2020 08/07/2020 07/31/2020  WBC 4.0 - 10.5 K/uL 6.8 6.6 7.1  Hemoglobin  13.0 - 17.0 g/dL 12.1(L) 12.4(L) 11.8(L)  Hematocrit 39.0 - 52.0 % 36.5(L) 38.7(L) 37.2(L)  Platelets 150 - 400 K/uL 206 222 209    CMP Latest Ref Rng & Units 10/09/2020 08/07/2020 07/31/2020  Glucose 70 - 99 mg/dL 92 91 98  BUN 8 - 23 mg/dL '23 19 18  '$ Creatinine 0.61 - 1.24 mg/dL 0.83 0.78 0.83  Sodium 135 - 145 mmol/L 143 142 141  Potassium [name].5 - 5.1 mmol/L [name].8 [name].9 4.[name]  Chloride 98 - 111 mmol/L 107 107 106  CO2 22 - 32 mmol/L '28 27 29  '$ Calcium 8.9 - 10.[name] mg/dL 9.[name] 9.[name] 9.[name]  Total Protein 6.5 - 8.1 g/dL 6.6 6.6 6.4(L)  Total Bilirubin 0.[name] - 1.2 mg/dL 0.9 1.0 0.7  Alkaline Phos 38 - 126 U/L 61 61 57  AST 15 - 41 U/L 14(L) 13(L) 13(L)  ALT 0 - 44 U/L '9 9 7  '$ uric acid 6.4  Lab Results  Component Value Date   LDH 148 07/31/2020     02/18/2019 CT Abdomen Pelvis W Contrast (Accession [id number]):   02/18/2019 CT Chest W Contrast (Accession [id number]):  08/08/17 Pathology:   08/05/17 Pathology:     RADIOGRAPHIC STUDIES: I have personally reviewed the radiological images as listed and agreed with the findings in the report. No results found.   ASSESSMENT & PLAN:   86 y.o. male with  1.h/o  atleast Stage III Follicular Lymphoma Grade 1-2 presenting with rt cervical lymphadenopathy  08/05/17 pathology results of right cervical LN revealing low grade follicular lymphoma  [id number] PET revealed Hypermetabolic lymphadenopathy involving the right neck, bilateral axillary, right hilar and right inguinal lymph nodes. No abdominal or pelvic involvement.    1/[name]/20 CT C/Harold/P revealed Progressive bilateral axillary and right inguinal lymphadenopathy, including necrotic lymph nodes in the right axilla, as above. Spleen is normal in size. 4 mm distal left ureteral calculus, unchanged. No hydronephrosis. Stable layering bladder calculi measuring up to 5 mm. Cholelithiasis, without associated inflammatory changes. Stable sequela of prior/chronic pancreatitis.   11/03/2018 CT neck revealed  "Marked interval progression of cervical lymphadenopathy since PET-CT 09/02/2017, now with lymphadenopathy present bilaterally at essentially all stations. Index nodes as described. This  includes bilateral intraparotid lymphadenopathy. Bulky right-sided adenopathy results in multifocal narrowing of the right internal jugular vein. Significant narrowing of the mid to lower left internal jugular vein of unclear cause as there is no significant mass effect at this level."  11/03/2018 CT CAP revealed "1. Progression of bilateral axillary, retroperitoneal, pelvic, and inguinal adenopathy. New anterior and Harold lower cervical adenopathy identified within the neck. 2. Persistent distal left ureteral calculus. Small bladder calculi are unchanged. [name]. Aortic Atherosclerosis (ICD10-I70.0). Coronary artery calcifications. 4. Sequelae of chronic pancreatitis noted."  10/01/2019 CT Abd/Pel ([id number]) revealed no evidence of mass in right kidney, new and enlarged abdominal and pelvic lymph nodes.   2. 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 [name]. H/o head and neck Basal cell carcinoma Continue routine skin checks with dermatologist    4. Renal indeterminate lesion -06/28/19 Renal US [id number]) revealed New right-sided mass at the hilum which measures 1.5cm in size.   -resolved on subsequent scans  5. Urinary stos in ureter and bladder.   PLAN: -Discussed pt labwork today, 10/09/2020; CBC CMP and LDH unremarkable. -Patient has no signs of progression of his follicular lymphoma at this time. -The pt has no prohibitive toxicities from continuing Rituxan at this time.  -Recommended pt continue to eat well and drink 48-64 oz water daily.  -Continue Multivitamin, Vitamin D, and B12 daily.  -Continue Lasix as per his primary care physician. -Discussed the use of Evusheld and patient is agreeable to this and she will be getting this today. -Plan would be to complete 1 to 2 years of Rituxan maintenance to try to get  Harold deeper response with his follicular lymphoma.   FOLLOW UP:  Evusheld referral Please schedule next 2 cycles of maintenance Rituxan [name] 2 months with labs and MD visits.   The total time spent in the appointment was 30 minutes and more than 50% was on counseling and direct patient cares, ordering and management of immunotherapy and discussion regarding Evusheld   All of the patient's questions were answered with apparent satisfaction. The patient knows to call the clinic with any problems, questions or concerns.   Sullivan Lone MD [place] AAHIVMS [facility] Hematology/Oncology Physician [facility]  (Office):       [phone] (Work cell):  [phone] (Fax):           [phone]  10/07/2020 11:27 AM  I, Reinaldo Raddle, am acting as scribe for Harold. Sullivan Lone, MD. .I have reviewed the above documentation for accuracy and completeness, and I agree with the above. Brunetta Genera MD

## 2020-10-09 ENCOUNTER — Other Ambulatory Visit: Payer: Self-pay | Admitting: Adult Health

## 2020-10-09 ENCOUNTER — Inpatient Hospital Stay: Payer: Medicare Other

## 2020-10-09 ENCOUNTER — Inpatient Hospital Stay: Payer: Medicare Other | Attending: Hematology

## 2020-10-09 ENCOUNTER — Inpatient Hospital Stay (HOSPITAL_BASED_OUTPATIENT_CLINIC_OR_DEPARTMENT_OTHER): Payer: Medicare Other | Admitting: Hematology

## 2020-10-09 ENCOUNTER — Other Ambulatory Visit: Payer: Self-pay

## 2020-10-09 VITALS — BP 136/67 | HR 65 | Temp 97.7°F | Resp 18 | Wt 154.8 lb

## 2020-10-09 DIAGNOSIS — C8201 Follicular lymphoma grade I, lymph nodes of head, face, and neck: Secondary | ICD-10-CM | POA: Diagnosis present

## 2020-10-09 DIAGNOSIS — C8208 Follicular lymphoma grade I, lymph nodes of multiple sites: Secondary | ICD-10-CM

## 2020-10-09 DIAGNOSIS — Z79899 Other long term (current) drug therapy: Secondary | ICD-10-CM | POA: Diagnosis not present

## 2020-10-09 DIAGNOSIS — C8291 Follicular lymphoma, unspecified, lymph nodes of head, face, and neck: Secondary | ICD-10-CM | POA: Diagnosis not present

## 2020-10-09 DIAGNOSIS — Z5111 Encounter for antineoplastic chemotherapy: Secondary | ICD-10-CM | POA: Insufficient documentation

## 2020-10-09 DIAGNOSIS — Z5112 Encounter for antineoplastic immunotherapy: Secondary | ICD-10-CM

## 2020-10-09 DIAGNOSIS — Z7189 Other specified counseling: Secondary | ICD-10-CM

## 2020-10-09 LAB — CBC WITH DIFFERENTIAL/PLATELET
Abs Immature Granulocytes: 0.02 10*3/uL (ref 0.00–0.07)
Basophils Absolute: 0 10*3/uL (ref 0.0–0.1)
Basophils Relative: 0 %
Eosinophils Absolute: 0.1 10*3/uL (ref 0.0–0.5)
Eosinophils Relative: 1 %
HCT: 36.5 % — ABNORMAL LOW (ref 39.0–52.0)
Hemoglobin: 12.1 g/dL — ABNORMAL LOW (ref 13.0–17.0)
Immature Granulocytes: 0 %
Lymphocytes Relative: 22 %
Lymphs Abs: 1.5 10*3/uL (ref 0.7–4.0)
MCH: 28.9 pg (ref 26.0–34.0)
MCHC: 33.2 g/dL (ref 30.0–36.0)
MCV: 87.1 fL (ref 80.0–100.0)
Monocytes Absolute: 0.7 10*3/uL (ref 0.1–1.0)
Monocytes Relative: 11 %
Neutro Abs: 4.5 10*3/uL (ref 1.7–7.7)
Neutrophils Relative %: 66 %
Platelets: 206 10*3/uL (ref 150–400)
RBC: 4.19 MIL/uL — ABNORMAL LOW (ref 4.22–5.81)
RDW: 14.3 % (ref 11.5–15.5)
WBC: 6.8 10*3/uL (ref 4.0–10.5)
nRBC: 0 % (ref 0.0–0.2)

## 2020-10-09 LAB — CMP (CANCER CENTER ONLY)
ALT: 9 U/L (ref 0–44)
AST: 14 U/L — ABNORMAL LOW (ref 15–41)
Albumin: 3.9 g/dL (ref 3.5–5.0)
Alkaline Phosphatase: 61 U/L (ref 38–126)
Anion gap: 8 (ref 5–15)
BUN: 23 mg/dL (ref 8–23)
CO2: 28 mmol/L (ref 22–32)
Calcium: 9.3 mg/dL (ref 8.9–10.3)
Chloride: 107 mmol/L (ref 98–111)
Creatinine: 0.83 mg/dL (ref 0.61–1.24)
GFR, Estimated: >60 mL/min (ref >60)
Glucose, Bld: 92 mg/dL (ref 70–99)
Potassium: 3.8 mmol/L (ref 3.5–5.1)
Sodium: 143 mmol/L (ref 135–145)
Total Bilirubin: 0.9 mg/dL (ref 0.3–1.2)
Total Protein: 6.6 g/dL (ref 6.5–8.1)

## 2020-10-09 MED ORDER — METHYLPREDNISOLONE SODIUM SUCC 125 MG IJ SOLR
INTRAMUSCULAR | Status: AC
  Filled 2020-10-09: qty 2

## 2020-10-09 MED ORDER — SODIUM CHLORIDE 0.9 % IV SOLN
375.0000 mg/m2 | Freq: Once | INTRAVENOUS | Status: AC
  Administered 2020-10-09: 700 mg via INTRAVENOUS
  Filled 2020-10-09: qty 50

## 2020-10-09 MED ORDER — CILGAVIMAB (PART OF EVUSHELD) INJECTION
300.0000 mg | Freq: Once | INTRAMUSCULAR | Status: AC
  Administered 2020-10-09: 300 mg via INTRAMUSCULAR
  Filled 2020-10-09: qty 3

## 2020-10-09 MED ORDER — DIPHENHYDRAMINE HCL 50 MG/ML IJ SOLN
INTRAMUSCULAR | Status: AC
  Filled 2020-10-09: qty 1

## 2020-10-09 MED ORDER — ACETAMINOPHEN 325 MG PO TABS
ORAL_TABLET | ORAL | Status: AC
  Filled 2020-10-09: qty 2

## 2020-10-09 MED ORDER — ACETAMINOPHEN 325 MG PO TABS
650.0000 mg | ORAL_TABLET | Freq: Once | ORAL | Status: AC
  Administered 2020-10-09: 650 mg via ORAL

## 2020-10-09 MED ORDER — TIXAGEVIMAB (PART OF EVUSHELD) INJECTION
300.0000 mg | Freq: Once | INTRAMUSCULAR | Status: AC
  Administered 2020-10-09: 300 mg via INTRAMUSCULAR
  Filled 2020-10-09: qty 3

## 2020-10-09 MED ORDER — FAMOTIDINE 20 MG IN NS 100 ML IVPB
INTRAVENOUS | Status: AC
  Filled 2020-10-09: qty 100

## 2020-10-09 MED ORDER — DIPHENHYDRAMINE HCL 25 MG PO CAPS
ORAL_CAPSULE | ORAL | Status: AC
  Filled 2020-10-09: qty 2

## 2020-10-09 MED ORDER — SODIUM CHLORIDE 0.9 % IV SOLN
Freq: Once | INTRAVENOUS | Status: AC
  Filled 2020-10-09: qty 250

## 2020-10-09 MED ORDER — FAMOTIDINE 20 MG IN NS 100 ML IVPB
20.0000 mg | Freq: Once | INTRAVENOUS | Status: AC
  Administered 2020-10-09: 20 mg via INTRAVENOUS

## 2020-10-09 MED ORDER — METHYLPREDNISOLONE SODIUM SUCC 125 MG IJ SOLR
125.0000 mg | Freq: Once | INTRAMUSCULAR | Status: AC
  Administered 2020-10-09: 125 mg via INTRAVENOUS

## 2020-10-09 MED ORDER — DIPHENHYDRAMINE HCL 25 MG PO CAPS
50.0000 mg | ORAL_CAPSULE | Freq: Once | ORAL | Status: AC
  Administered 2020-10-09: 50 mg via ORAL

## 2020-10-09 NOTE — Progress Notes (Signed)
Per Harold Stall, RN, patient verbally consented to receive Evusheld injection today during his infusion appointment.  He was given patient fact sheet to review risks/benefits again prior to injection.  Harold Bihari, NP

## 2020-10-09 NOTE — Progress Notes (Signed)
Pt rec'd Evusheld this visit in bilateral gluteal.  Pt tolerated the procedure well.  Pt was observed for 1hr post injections with no change in status.  Pt discharged home with daughter.

## 2020-10-09 NOTE — Patient Instructions (Addendum)
[facility] ONCOLOGY  Discharge Instructions: Thank you for choosing [facility] to provide your oncology and hematology care.   If you have a lab appointment with the [facility], please go directly to the [facility] and check in at the registration area.   Wear comfortable clothing and clothing appropriate for easy access to any Portacath or PICC line.   We strive to give you quality time with your provider. You may need to reschedule your appointment if you arrive late (15 or more minutes).  Arriving late affects you and other patients whose appointments are after yours.  Also, if you miss three or more appointments without notifying the office, you may be dismissed from the clinic at the provider's discretion.      For prescription refill requests, have your pharmacy contact our office and allow 72 hours for refills to be completed.    Today you received the following chemotherapy and/or immunotherapy agents Rituximab and Evusheld   To help prevent nausea and vomiting after your treatment, we encourage you to take your nausea medication as directed.  BELOW ARE SYMPTOMS THAT SHOULD BE REPORTED IMMEDIATELY: *FEVER GREATER THAN 100.4 F (38 C) OR HIGHER *CHILLS OR SWEATING *NAUSEA AND VOMITING THAT IS NOT CONTROLLED WITH YOUR NAUSEA MEDICATION *UNUSUAL SHORTNESS OF BREATH *UNUSUAL BRUISING OR BLEEDING *URINARY PROBLEMS (pain or burning when urinating, or frequent urination) *BOWEL PROBLEMS (unusual diarrhea, constipation, pain near the anus) TENDERNESS IN MOUTH AND THROAT WITH OR WITHOUT PRESENCE OF ULCERS (sore throat, sores in mouth, or a toothache) UNUSUAL RASH, SWELLING OR PAIN  UNUSUAL VAGINAL DISCHARGE OR ITCHING   Items with * indicate a potential emergency and should be followed up as soon as possible or go to the Emergency Department if any problems should occur.  Please show the CHEMOTHERAPY ALERT CARD or IMMUNOTHERAPY ALERT CARD at  check-in to the Emergency Department and triage nurse.  Should you have questions after your visit or need to cancel or reschedule your appointment, please contact [facility]  Dept: [phone]  and follow the prompts.  Office hours are 8:00 a.m. to 4:30 p.m. Monday - Friday. Please note that voicemails left after 4:00 p.m. may not be returned until the following business day.  We are closed weekends and major holidays. You have access to a nurse at all times for urgent questions. Please call the main number to the clinic Dept: [phone] and follow the prompts.   For any non-urgent questions, you may also contact your provider using MyChart. We now offer e-Visits for anyone 55 and older to request care online for non-urgent symptoms. For details visit [email or web address].   Also download the MyChart app! Go to the app store, search "MyChart", open the app, select [facility], and log in with your MyChart username and password.  Due to Covid, a mask is required upon entering the hospital/clinic. If you do not have a mask, one will be given to you upon arrival. For doctor visits, patients may have 1 support person aged 27 or older with them. For treatment visits, patients cannot have anyone with them due to current Covid guidelines and our immunocompromised population.

## 2020-10-10 ENCOUNTER — Telehealth: Payer: Self-pay | Admitting: Hematology

## 2020-10-10 NOTE — Telephone Encounter (Signed)
Scheduled follow-up appointments per 7/25 los. Patient's daughter is aware.

## 2020-10-15 ENCOUNTER — Encounter: Payer: Self-pay | Admitting: Hematology

## 2020-10-20 ENCOUNTER — Other Ambulatory Visit: Payer: Self-pay | Admitting: Hematology

## 2020-10-30 DIAGNOSIS — Z86718 Personal history of other venous thrombosis and embolism: Secondary | ICD-10-CM | POA: Insufficient documentation

## 2020-11-16 ENCOUNTER — Encounter: Payer: Self-pay | Admitting: Cardiovascular Disease

## 2020-11-16 NOTE — Progress Notes (Signed)
Chief Complaint  Patient presents with   Follow-up    CAD    History of Present Illness: 86 yo male with history of CAD, HTN, follicular lymphoma and HLD here today for cardiac follow up. He underwent catheterization in October 2011 because of chest pain and was found to have a severe stenosis in a large posterolateral branch of the right coronary artery and a severe stenosis in the proximal LAD with complete occlusion of the distal LAD. Drug eluting stents were placed in the posterior lateral branch of the right coronary and the mid LAD in October 2011. He was diagnosed with lymphoma in March 2019. This was found when he had a mass on his neck. This was treated with resection. He completed chemotherapy. Echo January 2020 with LVEF=60-65%. No significant valve disease. He was seen in my office February 2022 and c/o bilateral leg swelling with left worse than the right. Venous dopplers February 2022 with peroneal vein thrombosis. He was started on Eliquis and completed six months of therapy.   He is here today for follow up. The patient denies any chest pain, dyspnea, palpitations, orthopnea, PND, dizziness, near syncope or syncope. Still with some mild LE edema.    Primary Care Physician: [name], MD  Past Medical History:  Diagnosis Date   BPH (benign prostatic hypertrophy)    CAD (coronary artery disease) 01/2010   S/P drug eluting stents LAD and PL branch of RCA Dr. Olevia Perches   DVT (deep venous thrombosis) ([place])    Peroneal vein thrombus February 123456   follicular lymphoma dx'd 06/2017   Hernia    HLD (hyperlipidemia)    HTN (hypertension)     Past Surgical History:  Procedure Laterality Date   BACK SURGERY     CYSTOSCOPY WITH LITHOLAPAXY N/A 05/20/2019   Procedure: CYSTOSCOPY WITH LITHOLAPAXY;  Surgeon: Ardis Hughs, MD;  Location: [facility];  Service: Urology;  Laterality: N/A;   CYSTOSCOPY/URETEROSCOPY/HOLMIUM LASER/STENT PLACEMENT Left 05/20/2019   Procedure:  CYSTOSCOPY/URETEROSCOPY/HOLMIUM LASER/STENT PLACEMENT;  Surgeon: Ardis Hughs, MD;  Location: [facility];  Service: Urology;  Laterality: Left;   HERNIA REPAIR  01/08/11   RIH   MASS EXCISION Right 08/05/2017   Procedure: EXCISION RIGHT POSTERIOR  NECK LYMPH NODE;  Surgeon: Rozetta Nunnery, MD;  Location: [facility];  Service: ENT;  Laterality: Right;    Current Outpatient Medications  Medication Sig Dispense Refill   aspirin EC 81 MG tablet Take 1 tablet (81 mg total) by mouth daily. Swallow whole. 90 tablet 3   atenolol (TENORMIN) 25 MG tablet Take 1 tablet (25 mg total) by mouth daily. 90 tablet 3   brimonidine (ALPHAGAN) 0.2 % ophthalmic solution Place 1 drop into the right eye 2 (two) times daily.     cholecalciferol (VITAMIN D) 1000 UNITS tablet Take 1,000 Units by mouth daily.     Cyanocobalamin (B-12) 2500 MCG TABS Take 2,500 mcg by mouth daily.     finasteride (PROSCAR) 5 MG tablet Take 5 mg by mouth daily.     furosemide (LASIX) 20 MG tablet Take 1 tablet (20 mg total) by mouth daily. 90 tablet 3   gabapentin (NEURONTIN) 100 MG capsule Take 100 mg by mouth daily as needed (knee pain).      isosorbide dinitrate (ISORDIL) 5 MG tablet Take 1 tablet (5 mg total) by mouth 2 (two) times daily. 180 tablet 3   lisinopril (ZESTRIL) 5 MG tablet Take 5 mg by mouth daily.  nitroGLYCERIN (NITROSTAT) 0.4 MG SL tablet Place 1 tablet (0.4 mg total) under the tongue every 5 (five) minutes as needed for chest pain. 25 tablet 6   potassium chloride SA (KLOR-CON) 20 MEQ tablet Take 20 mEq by mouth every other day.     pravastatin (PRAVACHOL) 40 MG tablet Take 1 tablet (40 mg total) by mouth at bedtime. 90 tablet 3   timolol (TIMOPTIC) 0.5 % ophthalmic solution Place 1 drop into the right eye daily. (Patient not taking: Reported on 11/17/2020)     No current facility-administered medications for this visit.    No Known Allergies  Social History   Socioeconomic History    Marital status: Married    Spouse name: Not on file   Number of children: Not on file   Years of education: Not on file   Highest education level: Not on file  Occupational History   Not on file  Tobacco Use   Smoking status: Never   Smokeless tobacco: Never  Vaping Use   Vaping Use: Never used  Substance and Sexual Activity   Alcohol use: No   Drug use: No   Sexual activity: Not on file  Other Topics Concern   Not on file  Social History Narrative   Married   Social Determinants of Health   Financial Resource Strain: Not on file  Food Insecurity: Not on file  Transportation Needs: Not on file  Physical Activity: Not on file  Stress: Not on file  Social Connections: Not on file  Intimate Partner Violence: Not on file    Family History  Problem Relation Age of Onset   Heart failure Mother     Review of Systems:  As stated in the HPI and otherwise negative.   BP (!) 114/50   Pulse 64   Ht '5\' 9"'$  (1.753 m)   Wt 150 lb (68 kg)   SpO2 99%   BMI 22.15 kg/m   Physical Examination:  General: Well developed, well nourished, NAD  HEENT: OP clear, mucus membranes moist  SKIN: warm, dry. No rashes. Neuro: No focal deficits  Musculoskeletal: Muscle strength 5/5 all ext  Psychiatric: Mood and affect normal  Neck: No JVD, no carotid bruits, no thyromegaly, no lymphadenopathy.  Lungs:Clear bilaterally, no wheezes, rhonci, crackles Cardiovascular: Regular rate and rhythm. No murmurs, gallops or rubs. Abdomen:Soft. Bowel sounds present. Non-tender.  Extremities: Trace bilateral lower extremity edema. Pulses are 2 + in the bilateral DP/PT.  EKG:  EKG is not ordered today. The ekg ordered today demonstrates   Echo January 2020:  1. The left ventricle has normal systolic function of 123456. The cavity  size is normal. There is no left ventricular wall thickness. Echo evidence  of impaired relaxation diastolic filling patterns.   2. Mildly dilated left atrial size.   3.  Normal right atrial size.   4. The mitral valve is degenerative. There is moderate thickening and  moderately calcified. Regurgitation is not visualized by color flow  Doppler.   5. Normal tricuspid valve.   6. The aortic valve tricuspid. There is moderate thickening and moderate  calcification of the aortic valve.   7. No atrial level shunt detected by color flow Doppler.   Recent Labs: 04/24/2020: Magnesium 1.8 10/09/2020: ALT 9; BUN 23; Creatinine 0.83; Hemoglobin 12.1; Platelets 206; Potassium 3.8; Sodium 143   Lipid Panel No results found for: CHOL, TRIG, HDL, CHOLHDL, VLDL, LDLCALC, LDLDIRECT   Wt Readings from Last 3 Encounters:  11/17/20 150 lb (68  kg)  10/09/20 154 lb 12.8 oz (70.2 kg)  08/07/20 159 lb 6.4 oz (72.3 kg)     Other studies Reviewed: Additional studies/ records that were reviewed today include:  Review of the above records demonstrates:    Assessment and Plan:   1. CAD without angina: He has no chest pain. Echo January 2020 with normal LV systolic function. Continue ASA, beta blocker, statin and Imdur. Of note, he was a non-responder to Plavix.   2. HTN: BP controlled. Continue current therapy  3. Hyperlipidemia: Lipids followed in primary care. Continue statin. No recent labs. Will check lipids and LFTs  4. Chronic diastolic CHF: Trace LE edema. Weight is stable. Continue Lasix.   5. Peroneal DVT: Eliquis has been stopped.   Current medicines are reviewed at length with the patient today.  The patient does not have concerns regarding medicines.  The following changes have been made:  no change  Labs/ tests ordered today include:   Orders Placed This Encounter  Procedures   Hepatic function panel   Lipid panel     Disposition:   F/U with me in 12 months.   Signed, Lauree Chandler, MD 11/17/2020 9:54 AM    [facility] [address], [place], [place]  [address] Phone: [phone]; Fax: [phone]

## 2020-11-17 ENCOUNTER — Encounter: Payer: Self-pay | Admitting: Cardiovascular Disease

## 2020-11-17 ENCOUNTER — Other Ambulatory Visit: Payer: Self-pay

## 2020-11-17 ENCOUNTER — Ambulatory Visit: Payer: Medicare Other | Admitting: Cardiovascular Disease

## 2020-11-17 VITALS — BP 114/50 | HR 64 | Ht 69.0 in | Wt 150.0 lb

## 2020-11-17 DIAGNOSIS — I5032 Chronic diastolic (congestive) heart failure: Secondary | ICD-10-CM

## 2020-11-17 DIAGNOSIS — I10 Essential (primary) hypertension: Secondary | ICD-10-CM

## 2020-11-17 DIAGNOSIS — E7800 Pure hypercholesterolemia, unspecified: Secondary | ICD-10-CM | POA: Diagnosis not present

## 2020-11-17 DIAGNOSIS — I2510 Atherosclerotic heart disease of native coronary artery without angina pectoris: Secondary | ICD-10-CM | POA: Diagnosis not present

## 2020-11-17 LAB — HEPATIC FUNCTION PANEL
ALT: 11 IU/L (ref 0–44)
AST: 16 IU/L (ref 0–40)
Albumin: 4.5 g/dL (ref 3.6–4.6)
Alkaline Phosphatase: 69 IU/L (ref 44–121)
Bilirubin Total: 0.8 mg/dL (ref 0.0–1.2)
Bilirubin, Direct: 0.22 mg/dL (ref 0.00–0.40)
Total Protein: 6.3 g/dL (ref 6.0–8.5)

## 2020-11-17 LAB — LIPID PANEL
Chol/HDL Ratio: 1.7 ratio (ref 0.0–5.0)
Cholesterol, Total: 130 mg/dL (ref 100–199)
HDL: 75 mg/dL (ref >39)
LDL Chol Calc (NIH): 42 mg/dL (ref 0–99)
Triglycerides: 62 mg/dL (ref 0–149)
VLDL Cholesterol Cal: 13 mg/dL (ref 5–40)

## 2020-11-17 MED ORDER — ASPIRIN EC 81 MG PO TBEC: 81.0000 mg | DELAYED_RELEASE_TABLET | Freq: Every day | ORAL | 3 refills | Status: DC

## 2020-11-17 NOTE — Patient Instructions (Signed)
Medication Instructions:  Your physician has recommended you make the following change in your medication:  1.) stop Eliquis 2.) start aspirin 81 mg (enteric coated)  *If you need a refill on your cardiac medications before your next appointment, please call your pharmacy*   Lab Work: Today: lipids and liver function  If you have labs (blood work) drawn today and your tests are completely normal, you will receive your results only by: [facility] (if you have MyChart) OR A paper copy in the mail If you have any lab test that is abnormal or we need to change your treatment, we will call you to review the results.   Testing/Procedures: none   Follow-Up: At [facility], you and your health needs are our priority.  As part of our continuing mission to provide you with exceptional heart care, we have created designated Provider Care Teams.  These Care Teams include your primary Cardiologist (physician) and Advanced Practice Providers (APPs -  Physician Assistants and Nurse Practitioners) who all work together to provide you with the care you need, when you need it.  Your next appointment:   12 month(s)  The format for your next appointment:   In Person  Provider:   You may see Lauree Chandler, MD or one of the following Advanced Practice Providers on your designated Care Team:   Melina Copa, PA-C Ermalinda Barrios, PA-C

## 2020-12-07 ENCOUNTER — Ambulatory Visit: Payer: Medicare Other | Admitting: Cardiovascular Disease

## 2020-12-08 ENCOUNTER — Other Ambulatory Visit: Payer: Self-pay

## 2020-12-08 DIAGNOSIS — C8208 Follicular lymphoma grade I, lymph nodes of multiple sites: Secondary | ICD-10-CM

## 2020-12-11 ENCOUNTER — Inpatient Hospital Stay: Payer: Medicare Other

## 2020-12-11 ENCOUNTER — Other Ambulatory Visit: Payer: Self-pay

## 2020-12-11 ENCOUNTER — Inpatient Hospital Stay: Payer: Medicare Other | Attending: Hematology

## 2020-12-11 ENCOUNTER — Inpatient Hospital Stay: Payer: Medicare Other | Admitting: Hematology

## 2020-12-11 VITALS — BP 139/64 | HR 98 | Temp 98.0°F | Resp 16 | Ht 69.0 in | Wt 157.6 lb

## 2020-12-11 VITALS — BP 148/67 | HR 59 | Temp 97.9°F | Resp 16

## 2020-12-11 DIAGNOSIS — Z5112 Encounter for antineoplastic immunotherapy: Secondary | ICD-10-CM | POA: Diagnosis present

## 2020-12-11 DIAGNOSIS — C8201 Follicular lymphoma grade I, lymph nodes of head, face, and neck: Secondary | ICD-10-CM | POA: Diagnosis present

## 2020-12-11 DIAGNOSIS — Z79899 Other long term (current) drug therapy: Secondary | ICD-10-CM | POA: Insufficient documentation

## 2020-12-11 DIAGNOSIS — Z7189 Other specified counseling: Secondary | ICD-10-CM

## 2020-12-11 DIAGNOSIS — C8208 Follicular lymphoma grade I, lymph nodes of multiple sites: Secondary | ICD-10-CM

## 2020-12-11 LAB — CMP (CANCER CENTER ONLY)
ALT: 10 U/L (ref 0–44)
AST: 14 U/L — ABNORMAL LOW (ref 15–41)
Albumin: 4.1 g/dL (ref 3.5–5.0)
Alkaline Phosphatase: 60 U/L (ref 38–126)
Anion gap: 11 (ref 5–15)
BUN: 25 mg/dL — ABNORMAL HIGH (ref 8–23)
CO2: 26 mmol/L (ref 22–32)
Calcium: 9.5 mg/dL (ref 8.9–10.3)
Chloride: 106 mmol/L (ref 98–111)
Creatinine: 0.8 mg/dL (ref 0.61–1.24)
GFR, Estimated: >60 mL/min (ref >60)
Glucose, Bld: 84 mg/dL (ref 70–99)
Potassium: 4 mmol/L (ref 3.5–5.1)
Sodium: 143 mmol/L (ref 135–145)
Total Bilirubin: 0.9 mg/dL (ref 0.3–1.2)
Total Protein: 6.8 g/dL (ref 6.5–8.1)

## 2020-12-11 LAB — CBC WITH DIFFERENTIAL (CANCER CENTER ONLY)
Abs Immature Granulocytes: 0.03 10*3/uL (ref 0.00–0.07)
Basophils Absolute: 0 10*3/uL (ref 0.0–0.1)
Basophils Relative: 0 %
Eosinophils Absolute: 0.2 10*3/uL (ref 0.0–0.5)
Eosinophils Relative: 3 %
HCT: 37.4 % — ABNORMAL LOW (ref 39.0–52.0)
Hemoglobin: 12 g/dL — ABNORMAL LOW (ref 13.0–17.0)
Immature Granulocytes: 1 %
Lymphocytes Relative: 26 %
Lymphs Abs: 1.6 10*3/uL (ref 0.7–4.0)
MCH: 28.8 pg (ref 26.0–34.0)
MCHC: 32.1 g/dL (ref 30.0–36.0)
MCV: 89.9 fL (ref 80.0–100.0)
Monocytes Absolute: 0.6 10*3/uL (ref 0.1–1.0)
Monocytes Relative: 9 %
Neutro Abs: 4 10*3/uL (ref 1.7–7.7)
Neutrophils Relative %: 61 %
Platelet Count: 209 10*3/uL (ref 150–400)
RBC: 4.16 MIL/uL — ABNORMAL LOW (ref 4.22–5.81)
RDW: 13.6 % (ref 11.5–15.5)
WBC Count: 6.4 10*3/uL (ref 4.0–10.5)
nRBC: 0 % (ref 0.0–0.2)

## 2020-12-11 LAB — LACTATE DEHYDROGENASE: LDH: 169 U/L (ref 98–192)

## 2020-12-11 MED ORDER — ACETAMINOPHEN 325 MG PO TABS
650.0000 mg | ORAL_TABLET | Freq: Once | ORAL | Status: AC
Start: 2020-12-11 — End: 2020-12-11
  Administered 2020-12-11: 650 mg via ORAL
  Filled 2020-12-11: qty 2

## 2020-12-11 MED ORDER — DIPHENHYDRAMINE HCL 25 MG PO CAPS
50.0000 mg | ORAL_CAPSULE | Freq: Once | ORAL | Status: AC
  Administered 2020-12-11: 50 mg via ORAL
  Filled 2020-12-11: qty 2

## 2020-12-11 MED ORDER — SODIUM CHLORIDE 0.9 % IV SOLN: Freq: Once | INTRAVENOUS | Status: AC

## 2020-12-11 MED ORDER — METHYLPREDNISOLONE SODIUM SUCC 125 MG IJ SOLR
125.0000 mg | Freq: Once | INTRAMUSCULAR | Status: AC
  Administered 2020-12-11: 125 mg via INTRAVENOUS
  Filled 2020-12-11: qty 2

## 2020-12-11 MED ORDER — SODIUM CHLORIDE 0.9 % IV SOLN
375.0000 mg/m2 | Freq: Once | INTRAVENOUS | Status: AC
  Administered 2020-12-11: 700 mg via INTRAVENOUS
  Filled 2020-12-11: qty 50

## 2020-12-11 MED ORDER — FAMOTIDINE 20 MG IN NS 100 ML IVPB
20.0000 mg | Freq: Once | INTRAVENOUS | Status: AC
  Administered 2020-12-11: 20 mg via INTRAVENOUS
  Filled 2020-12-11: qty 100

## 2020-12-11 NOTE — Patient Instructions (Signed)
[facility] MEDICAL ONCOLOGY  Discharge Instructions: Thank you for choosing [facility] to provide your oncology and hematology care.   If you have a lab appointment with the Cancer Center, please go directly to the Cancer Center and check in at the registration area.   Wear comfortable clothing and clothing appropriate for easy access to any Portacath or PICC line.   We strive to give you quality time with your provider. You may need to reschedule your appointment if you arrive late (15 or more minutes).  Arriving late affects you and other patients whose appointments are after yours.  Also, if you miss three or more appointments without notifying the office, you may be dismissed from the clinic at the provider's discretion.      For prescription refill requests, have your pharmacy contact our office and allow 72 hours for refills to be completed.    Today you received the following chemotherapy and/or immunotherapy agents Rituximab      To help prevent nausea and vomiting after your treatment, we encourage you to take your nausea medication as directed.  BELOW ARE SYMPTOMS THAT SHOULD BE REPORTED IMMEDIATELY: *FEVER GREATER THAN 100.4 F (38 C) OR HIGHER *CHILLS OR SWEATING *NAUSEA AND VOMITING THAT IS NOT CONTROLLED WITH YOUR NAUSEA MEDICATION *UNUSUAL SHORTNESS OF BREATH *UNUSUAL BRUISING OR BLEEDING *URINARY PROBLEMS (pain or burning when urinating, or frequent urination) *BOWEL PROBLEMS (unusual diarrhea, constipation, pain near the anus) TENDERNESS IN MOUTH AND THROAT WITH OR WITHOUT PRESENCE OF ULCERS (sore throat, sores in mouth, or a toothache) UNUSUAL RASH, SWELLING OR PAIN  UNUSUAL VAGINAL DISCHARGE OR ITCHING   Items with * indicate a potential emergency and should be followed up as soon as possible or go to the Emergency Department if any problems should occur.  Please show the CHEMOTHERAPY ALERT CARD or IMMUNOTHERAPY ALERT CARD at check-in to  the Emergency Department and triage nurse.  Should you have questions after your visit or need to cancel or reschedule your appointment, please contact [facility]ICAL [facility]  Dept: [phone]  and follow the prompts.  Office hours are 8:00 a.m. to 4:30 p.m. Monday - Friday. Please note that voicemails left after 4:00 p.m. may not be returned until the following business day.  We are closed weekends and major holidays. You have access to a nurse at all times for urgent questions. Please call the main number to the clinic Dept: [phone] and follow the prompts.   For any non-urgent questions, you may also contact your provider using MyChart. We now offer e-Visits for anyone 18 and older to request care online for non-urgent symptoms. For details visit [email or web address].   Also download the MyChart app! Go to the app store, search "MyChart", open the app, select [facility], and log in with your MyChart username and password.  Due to Covid, a mask is required upon entering the hospital/clinic. If you do not have a mask, one will be given to you upon arrival. For doctor visits, patients may have 1 support person aged 18 or older with them. For treatment visits, patients cannot have anyone with them due to current Covid guidelines and our immunocompromised population.   

## 2020-12-18 ENCOUNTER — Encounter: Payer: Self-pay | Admitting: Hematology

## 2020-12-18 NOTE — Progress Notes (Signed)
HEMATOLOGY/ONCOLOGY CLINIC NOTE  Date of Service: 12/18/2020  Patient Care Team: [name], MD as PCP - General (Family Medicine) [name], MD as PCP - Cardiology (Cardiology)  CHIEF COMPLAINTS/PURPOSE OF CONSULTATION:  mx of low grade Follicular Lymphoma  HISTORY OF PRESENTING ILLNESS:   Harold [name] is a wonderful 86 y.o. male who has been referred to [place] by Dr [name] for evaluation and management of Follicular Lymphoma. He is accompanied today by his sister. The pt reports that he is doing well overall.   The pt reports first noticing the rt neck mass develop in his neck about a year ago and saw his PCP Dr [name].  The mass was getting larger and so he was referred to Dr [name] for further evaluation and had a biopsy of the rt upper neck mass which showed Grade 1-2 follicular lymphoma. pt has had Tissue flow cytometry completed on 08/05/17 with results revealing A LAMBDA-RESTRICTED MONOCLONAL B-CELL POPULATION EXPRESSING CD10 COMPRISES 39% OF ALL LYMPHOCYTES.  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 He denies feeling any differently now than he did a year ago. He takes daily 81mg  aspirin which he notes has led to some mild increased bruising.   Of note prior to the patient's visit today,  He notes that he has had a couple spots of melanoma on his chin and back about 5 and 8 years ago. These were subsequently removed. He has had a basal cell as well and has been treated several times with liquid nitrogen.   On review of systems, pt reports stable energy levels, some bruising, stable bowel habits, and denies fevers, chills, night sweats, unexpected weight loss, pain along the spine, abdominal pains, testicular pain or swelling, and any other symptoms.   On PMHx the pt reports melanoma, CAD,  HTN, HLD, cataract in left eye.   INTERVAL HISTORY:   Harold [name] returns today for management and evaluation of his follicular lymphoma and next dose of maintenance Rituxan. The patient's last visit with [place] was on 10/09/2020 . the pt reports that he is doing well overall.  The pt reports he has been well since his last visit and notes no acute new symptoms.  No infections.  No fevers no chills no night sweats no unexpected weight loss.  No new lumps or bumps.   No significant treatment toxicities from his last dose of Rituxan.  Notes grade 1 fatigue for a day or 2.  His primary concern has been his wife's progressive dementia. Lab results today 12/11/2020 reviewed with the patient .  No issues with tolerating Evusheld.  MEDICAL HISTORY:  Past Medical History:  Diagnosis Date   BPH (benign prostatic hypertrophy)    CAD (coronary artery disease) 01/2010   S/P drug eluting stents LAD and PL branch of RCA Dr. Olevia Perches   DVT (deep venous thrombosis) ([place])    Peroneal vein thrombus February 9417   follicular lymphoma dx'd 06/2017   Hernia    HLD (hyperlipidemia)    HTN (hypertension)     SURGICAL HISTORY: Past Surgical History:  Procedure Laterality Date   BACK SURGERY     CYSTOSCOPY WITH LITHOLAPAXY N/A 05/20/2019   Procedure: CYSTOSCOPY WITH LITHOLAPAXY;  Surgeon: Ardis Hughs, MD;  [facility];  Service: Urology;  Laterality: N/A;   CYSTOSCOPY/URETEROSCOPY/HOLMIUM LASER/STENT  PLACEMENT Left 05/20/2019   Procedure: CYSTOSCOPY/URETEROSCOPY/HOLMIUM LASER/STENT PLACEMENT;  Surgeon: Ardis Hughs, MD;  [facility];  Service: Urology;  Laterality: Left;   HERNIA REPAIR  01/08/11   RIH   MASS EXCISION Right 08/05/2017   Procedure: EXCISION RIGHT POSTERIOR  NECK LYMPH NODE;  Surgeon: Rozetta Nunnery, MD;  Location: [facility];  Service: ENT;  Laterality: Right;    SOCIAL HISTORY: Social History   Socioeconomic History   Marital status: Married     Spouse name: Not on file   Number of children: Not on file   Years of education: Not on file   Highest education level: Not on file  Occupational History   Not on file  Tobacco Use   Smoking status: Never   Smokeless tobacco: Never  Vaping Use   Vaping Use: Never used  Substance and Sexual Activity   Alcohol use: No   Drug use: No   Sexual activity: Not on file  Other Topics Concern   Not on file  Social History Narrative   Married   Social Determinants of Health   Financial Resource Strain: Not on file  Food Insecurity: Not on file  Transportation Needs: Not on file  Physical Activity: Not on file  Stress: Not on file  Social Connections: Not on file  Intimate Partner Violence: Not on file    FAMILY HISTORY: Family History  Problem Relation Age of Onset   Heart failure Mother     ALLERGIES:  has No Known Allergies.  MEDICATIONS:  Current Outpatient Medications  Medication Sig Dispense Refill   aspirin EC 81 MG tablet Take 1 tablet (81 mg total) by mouth daily. Swallow whole. 90 tablet 3   atenolol (TENORMIN) 25 MG tablet Take 1 tablet (25 mg total) by mouth daily. 90 tablet 3   brimonidine (ALPHAGAN) 0.2 % ophthalmic solution Place 1 drop into the right eye 2 (two) times daily.     cholecalciferol (VITAMIN D) 1000 UNITS tablet Take 1,000 Units by mouth daily.     Cyanocobalamin (B-12) 2500 MCG TABS Take 2,500 mcg by mouth daily.     finasteride (PROSCAR) 5 MG tablet Take 5 mg by mouth daily.     furosemide (LASIX) 20 MG tablet Take 1 tablet (20 mg total) by mouth daily. 90 tablet 3   gabapentin (NEURONTIN) 100 MG capsule Take 100 mg by mouth daily as needed (knee pain).      isosorbide dinitrate (ISORDIL) 5 MG tablet Take 1 tablet (5 mg total) by mouth 2 (two) times daily. 180 tablet 3   lisinopril (ZESTRIL) 5 MG tablet Take 5 mg by mouth daily.     nitroGLYCERIN (NITROSTAT) 0.4 MG SL tablet Place 1 tablet (0.4 mg total) under the tongue every 5 (five) minutes  as needed for chest pain. 25 tablet 6   potassium chloride SA (KLOR-CON) 20 MEQ tablet Take 20 mEq by mouth every other day.     pravastatin (PRAVACHOL) 40 MG tablet Take 1 tablet (40 mg total) by mouth at bedtime. 90 tablet 3   timolol (TIMOPTIC) 0.5 % ophthalmic solution Place 1 drop into the right eye daily. (Patient not taking: Reported on 11/17/2020)     No current facility-administered medications for this visit.    REVIEW OF SYSTEMS:   .10 Point review of Systems was done is negative except as noted above.   PHYSICAL EXAMINATION: ECOG FS:2 - Symptomatic, <50% confined to bed  Vitals:   12/11/20 7619  BP: 139/64  Pulse: 98  Resp: 16  Temp: 98 F (36.7 C)  SpO2: 97%    Wt Readings from Last 3 Encounters:  12/11/20 157 lb 9.6 oz (71.5 kg)  11/17/20 150 lb (68 kg)  10/09/20 154 lb 12.8 oz (70.2 kg)   Body mass index is 23.27 kg/m.   [name] GENERAL:alert, in no acute distress and comfortable SKIN: no acute rashes, no significant lesions EYES: conjunctiva are pink and non-injected, sclera anicteric OROPHARYNX: MMM, no exudates, no oropharyngeal erythema or ulceration NECK: supple, no JVD LYMPH:  no palpable lymphadenopathy in the cervical, axillary or inguinal regions LUNGS: clear to auscultation b/l with normal respiratory effort HEART: regular rate & rhythm ABDOMEN:  normoactive bowel sounds , non tender, not distended. Extremity: no pedal edema PSYCH: alert & oriented x 3 with fluent speech NEURO: no focal motor/sensory deficits  LABORATORY DATA:  I have reviewed the data as listed  CBC Latest Ref Rng & Units 12/11/2020 10/09/2020 08/07/2020  WBC 4.0 - 10.5 K/uL 6.4 6.8 6.6  Hemoglobin 13.0 - 17.0 g/dL 12.0(L) 12.1(L) 12.4(L)  Hematocrit 39.0 - 52.0 % 37.4(L) 36.5(L) 38.7(L)  Platelets 150 - 400 K/uL 209 206 222    CMP Latest Ref Rng & Units 12/11/2020 11/17/2020 10/09/2020  Glucose 70 - 99 mg/dL 84 - 92  BUN 8 - 23 mg/dL 25(H) - 23  Creatinine 0.61 - 1.24 mg/dL 0.80  - 0.83  Sodium 135 - 145 mmol/L 143 - 143  Potassium 3.5 - 5.1 mmol/L 4.0 - 3.8  Chloride 98 - 111 mmol/L 106 - 107  CO2 22 - 32 mmol/L 26 - 28  Calcium 8.9 - 10.3 mg/dL 9.5 - 9.3  Total Protein 6.5 - 8.1 g/dL 6.8 6.3 6.6  Total Bilirubin 0.3 - 1.2 mg/dL 0.9 0.8 0.9  Alkaline Phos 38 - 126 U/L 60 69 61  AST 15 - 41 U/L 14(L) 16 14(L)  ALT 0 - 44 U/L 10 11 9   uric acid 6.4  Lab Results  Component Value Date   LDH 169 12/11/2020     02/18/2019 CT Abdomen Pelvis W Contrast (Accession [id number]):   02/18/2019 CT Chest W Contrast (Accession [id number]):  08/08/17 Pathology:   08/05/17 Pathology:     RADIOGRAPHIC STUDIES: I have personally reviewed the radiological images as listed and agreed with the findings in the report. No results found.   ASSESSMENT & PLAN:   86 y.o. male with  1.h/o  atleast Stage III Follicular Lymphoma Grade 1-2 presenting with rt cervical lymphadenopathy  08/05/17 pathology results of right cervical LN revealing low grade follicular lymphoma  6/57/84 PET revealed Hypermetabolic lymphadenopathy involving the right neck, bilateral axillary, right hilar and right inguinal lymph nodes. No abdominal or pelvic involvement.    03/20/18 CT C/A/P revealed Progressive bilateral axillary and right inguinal lymphadenopathy, including necrotic lymph nodes in the right axilla, as above. Spleen is normal in size. 4 mm distal left ureteral calculus, unchanged. No hydronephrosis. Stable layering bladder calculi measuring up to 5 mm. Cholelithiasis, without associated inflammatory changes. Stable sequela of prior/chronic pancreatitis.   11/03/2018 CT neck revealed "Marked interval progression of cervical lymphadenopathy since PET-CT 09/02/2017, now with lymphadenopathy present bilaterally at essentially all stations. Index nodes as described. This includes bilateral intraparotid lymphadenopathy. Bulky right-sided adenopathy results in multifocal narrowing of the right  internal jugular vein. Significant narrowing of the mid to lower left internal jugular vein of unclear cause as there is no significant mass effect at this level."  11/03/2018 CT CAP revealed "1. Progression of bilateral axillary, retroperitoneal, pelvic, and inguinal adenopathy. New anterior and posterior lower cervical adenopathy identified within the neck. 2. Persistent distal left ureteral calculus. Small bladder calculi are unchanged. 3. Aortic Atherosclerosis (ICD10-I70.0). Coronary artery calcifications. 4. Sequelae of chronic pancreatitis noted."  10/01/2019 CT Abd/Pel ([id number]) revealed no evidence of mass in right kidney, new and enlarged abdominal and pelvic lymph nodes.   2. 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 3. H/o head and neck Basal cell carcinoma Continue routine skin checks with dermatologist    4. Renal indeterminate lesion -06/28/19 Renal US (17711) revealed New right-sided mass at the hilum which measures 1.5cm in size.   -resolved on subsequent scans  5. Urinary stos in ureter and bladder.   PLAN: -Discussed pt labwork today, 12/11/2020; CBC CMP and LDH unremarkable. -Patient has no signs of progression of his follicular lymphoma at this time. -The pt has no prohibitive toxicities from continuing Rituxan at this time.  -Recommended pt continue to eat well and drink 48-64 oz water daily.  -Continue Multivitamin, Vitamin D, and B12 daily.  -Continue Lasix as per his primary care physician. -Plan would be to complete 1 to 2 years of Rituxan maintenance to try to get a deeper response with his follicular lymphoma.   FOLLOW UP:  Please schedule next 2 cycles of maintenance Rituxan every 2 months with labs and MD visits.   . The total time spent in the appointment was 20 minutes and more than 50% was on counseling and direct patient cares.  All of the patient's questions were answered with apparent satisfaction. The patient knows to call the clinic with any  problems, questions or concerns.   Sullivan Lone MD MS AAHIVMS [facility] Hematology/Oncology Physician [facility] rheumatoid

## 2021-02-07 ENCOUNTER — Inpatient Hospital Stay: Payer: Medicare Other

## 2021-02-07 ENCOUNTER — Inpatient Hospital Stay: Payer: Medicare Other | Attending: Hematology

## 2021-02-07 ENCOUNTER — Inpatient Hospital Stay: Payer: Medicare Other | Admitting: Hematology

## 2021-02-07 ENCOUNTER — Other Ambulatory Visit: Payer: Self-pay

## 2021-02-07 VITALS — BP 121/49 | HR 64 | Temp 98.0°F | Resp 16

## 2021-02-07 VITALS — BP 136/58 | HR 68 | Temp 98.1°F | Resp 18 | Wt 159.1 lb

## 2021-02-07 DIAGNOSIS — C8201 Follicular lymphoma grade I, lymph nodes of head, face, and neck: Secondary | ICD-10-CM | POA: Insufficient documentation

## 2021-02-07 DIAGNOSIS — C8208 Follicular lymphoma grade I, lymph nodes of multiple sites: Secondary | ICD-10-CM

## 2021-02-07 DIAGNOSIS — Z5112 Encounter for antineoplastic immunotherapy: Secondary | ICD-10-CM | POA: Insufficient documentation

## 2021-02-07 DIAGNOSIS — Z9221 Personal history of antineoplastic chemotherapy: Secondary | ICD-10-CM | POA: Diagnosis not present

## 2021-02-07 DIAGNOSIS — Z7189 Other specified counseling: Secondary | ICD-10-CM

## 2021-02-07 DIAGNOSIS — Z79899 Other long term (current) drug therapy: Secondary | ICD-10-CM | POA: Diagnosis not present

## 2021-02-07 LAB — LACTATE DEHYDROGENASE: LDH: 176 U/L (ref 98–192)

## 2021-02-07 LAB — CBC WITH DIFFERENTIAL (CANCER CENTER ONLY)
Abs Immature Granulocytes: 0.03 10*3/uL (ref 0.00–0.07)
Basophils Absolute: 0 10*3/uL (ref 0.0–0.1)
Basophils Relative: 0 %
Eosinophils Absolute: 0.1 10*3/uL (ref 0.0–0.5)
Eosinophils Relative: 2 %
HCT: 37.8 % — ABNORMAL LOW (ref 39.0–52.0)
Hemoglobin: 12.1 g/dL — ABNORMAL LOW (ref 13.0–17.0)
Immature Granulocytes: 0 %
Lymphocytes Relative: 23 %
Lymphs Abs: 1.7 10*3/uL (ref 0.7–4.0)
MCH: 28.5 pg (ref 26.0–34.0)
MCHC: 32 g/dL (ref 30.0–36.0)
MCV: 89.2 fL (ref 80.0–100.0)
Monocytes Absolute: 0.7 10*3/uL (ref 0.1–1.0)
Monocytes Relative: 9 %
Neutro Abs: 4.9 10*3/uL (ref 1.7–7.7)
Neutrophils Relative %: 66 %
Platelet Count: 223 10*3/uL (ref 150–400)
RBC: 4.24 MIL/uL (ref 4.22–5.81)
RDW: 13.3 % (ref 11.5–15.5)
WBC Count: 7.4 10*3/uL (ref 4.0–10.5)
nRBC: 0 % (ref 0.0–0.2)

## 2021-02-07 LAB — CMP (CANCER CENTER ONLY)
ALT: 13 U/L (ref 0–44)
AST: 15 U/L (ref 15–41)
Albumin: 4 g/dL (ref 3.5–5.0)
Alkaline Phosphatase: 65 U/L (ref 38–126)
Anion gap: 8 (ref 5–15)
BUN: 24 mg/dL — ABNORMAL HIGH (ref 8–23)
CO2: 28 mmol/L (ref 22–32)
Calcium: 9.2 mg/dL (ref 8.9–10.3)
Chloride: 106 mmol/L (ref 98–111)
Creatinine: 0.81 mg/dL (ref 0.61–1.24)
GFR, Estimated: >60 mL/min (ref >60)
Glucose, Bld: 86 mg/dL (ref 70–99)
Potassium: 3.7 mmol/L (ref 3.5–5.1)
Sodium: 142 mmol/L (ref 135–145)
Total Bilirubin: 0.8 mg/dL (ref 0.3–1.2)
Total Protein: 6.6 g/dL (ref 6.5–8.1)

## 2021-02-07 MED ORDER — ACETAMINOPHEN 325 MG PO TABS
650.0000 mg | ORAL_TABLET | Freq: Once | ORAL | Status: AC
  Administered 2021-02-07: 650 mg via ORAL
  Filled 2021-02-07: qty 2

## 2021-02-07 MED ORDER — METHYLPREDNISOLONE SODIUM SUCC 125 MG IJ SOLR
125.0000 mg | Freq: Once | INTRAMUSCULAR | Status: AC
  Administered 2021-02-07: 125 mg via INTRAVENOUS
  Filled 2021-02-07: qty 2

## 2021-02-07 MED ORDER — DIPHENHYDRAMINE HCL 25 MG PO CAPS
50.0000 mg | ORAL_CAPSULE | Freq: Once | ORAL | Status: AC
  Administered 2021-02-07: 50 mg via ORAL
  Filled 2021-02-07: qty 2

## 2021-02-07 MED ORDER — SODIUM CHLORIDE 0.9 % IV SOLN
375.0000 mg/m2 | Freq: Once | INTRAVENOUS | Status: AC
  Administered 2021-02-07: 700 mg via INTRAVENOUS
  Filled 2021-02-07: qty 50

## 2021-02-07 MED ORDER — SODIUM CHLORIDE 0.9 % IV SOLN: Freq: Once | INTRAVENOUS | Status: AC

## 2021-02-07 MED ORDER — FAMOTIDINE 20 MG IN NS 100 ML IVPB
20.0000 mg | Freq: Once | INTRAVENOUS | Status: AC
  Administered 2021-02-07: 20 mg via INTRAVENOUS
  Filled 2021-02-07: qty 100

## 2021-02-07 NOTE — Patient Instructions (Signed)
[facility] MEDICAL ONCOLOGY  Discharge Instructions: Thank you for choosing [facility] to provide your oncology and hematology care.   If you have a lab appointment with the Cancer Center, please go directly to the Cancer Center and check in at the registration area.   Wear comfortable clothing and clothing appropriate for easy access to any Portacath or PICC line.   We strive to give you quality time with your provider. You may need to reschedule your appointment if you arrive late (15 or more minutes).  Arriving late affects you and other patients whose appointments are after yours.  Also, if you miss three or more appointments without notifying the office, you may be dismissed from the clinic at the provider's discretion.      For prescription refill requests, have your pharmacy contact our office and allow 72 hours for refills to be completed.    Today you received the following chemotherapy and/or immunotherapy agents Rituximab      To help prevent nausea and vomiting after your treatment, we encourage you to take your nausea medication as directed.  BELOW ARE SYMPTOMS THAT SHOULD BE REPORTED IMMEDIATELY: *FEVER GREATER THAN 100.4 F (38 C) OR HIGHER *CHILLS OR SWEATING *NAUSEA AND VOMITING THAT IS NOT CONTROLLED WITH YOUR NAUSEA MEDICATION *UNUSUAL SHORTNESS OF BREATH *UNUSUAL BRUISING OR BLEEDING *URINARY PROBLEMS (pain or burning when urinating, or frequent urination) *BOWEL PROBLEMS (unusual diarrhea, constipation, pain near the anus) TENDERNESS IN MOUTH AND THROAT WITH OR WITHOUT PRESENCE OF ULCERS (sore throat, sores in mouth, or a toothache) UNUSUAL RASH, SWELLING OR PAIN  UNUSUAL VAGINAL DISCHARGE OR ITCHING   Items with * indicate a potential emergency and should be followed up as soon as possible or go to the Emergency Department if any problems should occur.  Please show the CHEMOTHERAPY ALERT CARD or IMMUNOTHERAPY ALERT CARD at check-in to  the Emergency Department and triage nurse.  Should you have questions after your visit or need to cancel or reschedule your appointment, please contact [facility]  Dept: [phone]  and follow the prompts.  Office hours are 8:00 a.m. to 4:30 p.m. Monday - Friday. Please note that voicemails left after 4:00 p.m. may not be returned until the following business day.  We are closed weekends and major holidays. You have access to a nurse at all times for urgent questions. Please call the main number to the clinic Dept: [phone] and follow the prompts.   For any non-urgent questions, you may also contact your provider using MyChart. We now offer e-Visits for anyone 18 and older to request care online for non-urgent symptoms. For details visit [email or web address].   Also download the MyChart app! Go to the app store, search "MyChart", open the app, select [facility], and log in with your MyChart username and password.  Due to Covid, a mask is required upon entering the hospital/clinic. If you do not have a mask, one will be given to you upon arrival. For doctor visits, patients may have 1 support person aged 18 or older with them. For treatment visits, patients cannot have anyone with them due to current Covid guidelines and our immunocompromised population.   

## 2021-02-09 ENCOUNTER — Telehealth: Payer: Self-pay | Admitting: Hematology

## 2021-02-09 NOTE — Telephone Encounter (Signed)
Scheduled follow-up appointments per 11/23 los. Patient's daughter is aware.

## 2021-02-13 ENCOUNTER — Encounter: Payer: Self-pay | Admitting: Hematology

## 2021-02-13 NOTE — Progress Notes (Addendum)
HEMATOLOGY/ONCOLOGY CLINIC NOTE  Date of Service: 02/16/2021  Patient Care Team: [name], MD as PCP - General (Family Medicine) [name], MD as PCP - Cardiology (Cardiology)  CHIEF COMPLAINTS/PURPOSE OF CONSULTATION:  mx of low grade Follicular Lymphoma  HISTORY OF PRESENTING ILLNESS:   Harold [name] is a wonderful 86 y.o. male who has been referred to [place] by Dr [name] for evaluation and management of Follicular Lymphoma. He is accompanied today by his sister. The pt reports that he is doing well overall.   The pt reports first noticing the rt neck mass develop in his neck about a year ago and saw his PCP Dr [name].  The mass was getting larger and so he was referred to Dr [name] for further evaluation and had a biopsy of the rt upper neck mass which showed Grade 1-2 follicular lymphoma. pt has had Tissue flow cytometry completed on 08/05/17 with results revealing A LAMBDA-RESTRICTED MONOCLONAL B-CELL POPULATION EXPRESSING CD10 COMPRISES 39% OF ALL LYMPHOCYTES.  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 He denies feeling any differently now than he did a year ago. He takes daily 81mg  aspirin which he notes has led to some mild increased bruising.   Of note prior to the patient's visit today,  He notes that he has had a couple spots of melanoma on his chin and back about 5 and 8 years ago. These were subsequently removed. He has had a basal cell as well and has been treated several times with liquid nitrogen.   On review of systems, pt reports stable energy levels, some bruising, stable bowel habits, and denies fevers, chills, night sweats, unexpected weight loss, pain along the spine, abdominal pains, testicular pain or swelling, and any other symptoms.   On PMHx the pt reports melanoma, CAD,  HTN, HLD, cataract in left eye.   INTERVAL HISTORY:   [name] returns today for management and evaluation of his follicular lymphoma and next dose of maintenance Rituxan. The patient's last visit with [place] was on 12/11/2020.   Patient notes that he has been feeling better than he has felt for several years.  No significant fatigue.  Does not note any new lumps or bumps and feels his previous lymph nodes are not palpable right now. No infection issues. Issues with abnormal bleeding or bruising. No overt toxicities from his previous dose of maintenance Rituxan.  Notes that his wife's dementia has been somewhat limiting but is glad for help from his family. He notes he has been trying to stay busy.  Lab results today 02/07/2021 showed normal hemoglobin of 12.1, WBC count of 7.4k platelets of 223k CMP unremarkable LDH within normal limits at 176.  MEDICAL HISTORY:  Past Medical History:  Diagnosis Date   BPH (benign prostatic hypertrophy)    CAD (coronary artery disease) 01/2010   S/P drug eluting stents LAD and PL branch of RCA Dr. Olevia Perches   DVT (deep venous thrombosis) ([place])    Peroneal vein thrombus February 9892   follicular lymphoma dx'd 06/2017   Hernia    HLD (hyperlipidemia)    HTN (hypertension)     SURGICAL HISTORY: Past Surgical History:  Procedure Laterality Date   BACK SURGERY     CYSTOSCOPY WITH LITHOLAPAXY N/A 05/20/2019   Procedure: CYSTOSCOPY WITH LITHOLAPAXY;  Surgeon: Louis Meckel  W, MD;  Location: [facility];  Service: Urology;  Laterality: N/A;   CYSTOSCOPY/URETEROSCOPY/HOLMIUM LASER/STENT PLACEMENT Left 05/20/2019   Procedure: CYSTOSCOPY/URETEROSCOPY/HOLMIUM LASER/STENT PLACEMENT;  Surgeon: Ardis Hughs, MD;  Location: [facility];  Service: Urology;  Laterality: Left;   HERNIA REPAIR  01/08/11   RIH   MASS EXCISION Right 08/05/2017   Procedure: EXCISION RIGHT POSTERIOR  NECK LYMPH NODE;  Surgeon: Rozetta Nunnery, MD;  Location: [facility];  Service: ENT;  Laterality: Right;    SOCIAL HISTORY: Social History   Socioeconomic History   Marital status: Married    Spouse name: Not on file   Number of children: Not on file   Years of education: Not on file   Highest education level: Not on file  Occupational History   Not on file  Tobacco Use   Smoking status: Never   Smokeless tobacco: Never  Vaping Use   Vaping Use: Never used  Substance and Sexual Activity   Alcohol use: No   Drug use: No   Sexual activity: Not on file  Other Topics Concern   Not on file  Social History Narrative   Married   Social Determinants of Health   Financial Resource Strain: Not on file  Food Insecurity: Not on file  Transportation Needs: Not on file  Physical Activity: Not on file  Stress: Not on file  Social Connections: Not on file  Intimate Partner Violence: Not on file    FAMILY HISTORY: Family History  Problem Relation Age of Onset   Heart failure Mother     ALLERGIES:  has No Known Allergies.  MEDICATIONS:  Current Outpatient Medications  Medication Sig Dispense Refill   aspirin EC 81 MG tablet Take 1 tablet (81 mg total) by mouth daily. Swallow whole. 90 tablet 3   atenolol (TENORMIN) 25 MG tablet Take 1 tablet (25 mg total) by mouth daily. 90 tablet 3   brimonidine (ALPHAGAN) 0.2 % ophthalmic solution Place 1 drop into the right eye 2 (two) times daily.     cholecalciferol (VITAMIN D) 1000 UNITS tablet Take 1,000 Units by mouth daily.     Cyanocobalamin (B-12) 2500 MCG TABS Take 2,500 mcg by mouth daily.     finasteride (PROSCAR) 5 MG tablet Take 5 mg by mouth daily.     furosemide (LASIX) 20 MG tablet Take 1 tablet (20 mg total) by mouth daily. 90 tablet 3   gabapentin (NEURONTIN) 100 MG capsule Take 100 mg by mouth daily as needed (knee pain).      isosorbide dinitrate (ISORDIL) 5 MG tablet Take 1 tablet (5 mg total) by mouth 2 (two) times daily. 180 tablet 3   lisinopril (ZESTRIL) 5 MG tablet Take 5 mg  by mouth daily.     nitroGLYCERIN (NITROSTAT) 0.4 MG SL tablet Place 1 tablet (0.4 mg total) under the tongue every 5 (five) minutes as needed for chest pain. 25 tablet 6   potassium chloride SA (KLOR-CON) 20 MEQ tablet Take 20 mEq by mouth every other day.     pravastatin (PRAVACHOL) 40 MG tablet Take 1 tablet (40 mg total) by mouth at bedtime. 90 tablet 3   timolol (TIMOPTIC) 0.5 % ophthalmic solution Place 1 drop into the right eye daily. (Patient not taking: Reported on 11/17/2020)     No current facility-administered medications for this visit.    REVIEW OF SYSTEMS:   .10 Point review of Systems was done is negative except as noted above.  PHYSICAL EXAMINATION:  ECOG FS:2 - Symptomatic, <50% confined to bed  Vitals:   02/07/21 1005  BP: (!) 136/58  Pulse: 68  Resp: 18  Temp: 98.1 F (36.7 C)  SpO2: 100%    Wt Readings from Last 3 Encounters:  02/07/21 159 lb 1.6 oz (72.2 kg)  12/11/20 157 lb 9.6 oz (71.5 kg)  11/17/20 150 lb (68 kg)   Body mass index is 23.49 kg/m.   [name] GENERAL:alert, in no acute distress and comfortable SKIN: no acute rashes, no significant lesions EYES: conjunctiva are pink and non-injected, sclera anicteric OROPHARYNX: MMM, no exudates, no oropharyngeal erythema or ulceration NECK: supple, no JVD LYMPH:  no palpable lymphadenopathy in the cervical, axillary or inguinal regions LUNGS: clear to auscultation b/l with normal respiratory effort HEART: regular rate & rhythm ABDOMEN:  normoactive bowel sounds , non tender, not distended. Extremity: Trace pedal edema bilaterally PSYCH: alert & oriented x 3 with fluent speech NEURO: no focal motor/sensory deficits  LABORATORY DATA:  I have reviewed the data as listed  CBC Latest Ref Rng & Units 02/07/2021 12/11/2020 10/09/2020  WBC 4.0 - 10.5 K/uL 7.4 6.4 6.8  Hemoglobin 13.0 - 17.0 g/dL 12.1(L) 12.0(L) 12.1(L)  Hematocrit 39.0 - 52.0 % 37.8(L) 37.4(L) 36.5(L)  Platelets 150 - 400 K/uL 223 209 206     CMP Latest Ref Rng & Units 02/07/2021 12/11/2020 11/17/2020  Glucose 70 - 99 mg/dL 86 84 -  BUN 8 - 23 mg/dL 24(H) 25(H) -  Creatinine 0.61 - 1.24 mg/dL 0.81 0.80 -  Sodium 135 - 145 mmol/L 142 143 -  Potassium 3.5 - 5.1 mmol/L 3.7 4.0 -  Chloride 98 - 111 mmol/L 106 106 -  CO2 22 - 32 mmol/L 28 26 -  Calcium 8.9 - 10.3 mg/dL 9.2 9.5 -  Total Protein 6.5 - 8.1 g/dL 6.6 6.8 6.3  Total Bilirubin 0.3 - 1.2 mg/dL 0.8 0.9 0.8  Alkaline Phos 38 - 126 U/L 65 60 69  AST 15 - 41 U/L 15 14(L) 16  ALT 0 - 44 U/L 13 10 11    Lab Results  Component Value Date   LDH 176 02/07/2021     02/18/2019 CT Abdomen Pelvis W Contrast (Accession [id number]):   02/18/2019 CT Chest W Contrast (Accession [id number]):  08/08/17 Pathology:   08/05/17 Pathology:     RADIOGRAPHIC STUDIES: I have personally reviewed the radiological images as listed and agreed with the findings in the report. No results found.   ASSESSMENT & PLAN:   86 y.o. male with  1.h/o  atleast Stage III Follicular Lymphoma Grade 1-2 presenting with rt cervical lymphadenopathy  08/05/17 pathology results of right cervical LN revealing low grade follicular lymphoma  0/62/69 PET revealed Hypermetabolic lymphadenopathy involving the right neck, bilateral axillary, right hilar and right inguinal lymph nodes. No abdominal or pelvic involvement.    03/20/18 CT C/A/P revealed Progressive bilateral axillary and right inguinal lymphadenopathy, including necrotic lymph nodes in the right axilla, as above. Spleen is normal in size. 4 mm distal left ureteral calculus, unchanged. No hydronephrosis. Stable layering bladder calculi measuring up to 5 mm. Cholelithiasis, without associated inflammatory changes. Stable sequela of prior/chronic pancreatitis.   11/03/2018 CT neck revealed "Marked interval progression of cervical lymphadenopathy since PET-CT 09/02/2017, now with lymphadenopathy present bilaterally at essentially all stations. Index  nodes as described. This includes bilateral intraparotid lymphadenopathy. Bulky right-sided adenopathy results in multifocal narrowing of the right internal jugular vein. Significant narrowing of the mid to lower left internal jugular vein  of unclear cause as there is no significant mass effect at this level."  11/03/2018 CT CAP revealed "1. Progression of bilateral axillary, retroperitoneal, pelvic, and inguinal adenopathy. New anterior and posterior lower cervical adenopathy identified within the neck. 2. Persistent distal left ureteral calculus. Small bladder calculi are unchanged. 3. Aortic Atherosclerosis (ICD10-I70.0). Coronary artery calcifications. 4. Sequelae of chronic pancreatitis noted."  10/01/2019 CT Abd/Pel ([id number]) revealed no evidence of mass in right kidney, new and enlarged abdominal and pelvic lymph nodes.   2. 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 3. H/o head and neck Basal cell carcinoma Continue routine skin checks with dermatologist    4. Renal indeterminate lesion -06/28/19 Renal US (67591) revealed New right-sided mass at the hilum which measures 1.5cm in size.   -resolved on subsequent scans  5. Urinary stos in ureter and bladder.   PLAN: -Discussed pt labwork today, 02/07/2021; CBC, CMP and LDH within normal limits -Patient has no lab or clinical findings suggestive of progression of his follicular lymphoma. -No prohibitive toxicities from continued maintenance Rituxan every 2 months at this time. -Patient was recommended to continue his vitamin D, B12 and multivitamin daily. - his leg swelling has nearly resolved Lasix her primary care physician -we redefined plan to continue 1 to 2 years of maintenance Rituxan to try to get deeper response and longer time to progression with his follicular lymphoma.   FOLLOW UP:  Please schedule next 3 cycles of maintenance Rituxan every 2 months with labs and MD visits.  The total time spent in the appointment was 20  minutes and more than 50% was on counseling and direct patient cares.  All of the patient's questions were answered with apparent satisfaction. The patient knows to call the clinic with any problems, questions or concerns.   Sullivan Lone MD [place] AAHIVMS [facility] Hematology/Oncology Physician [facility]

## 2021-04-12 ENCOUNTER — Inpatient Hospital Stay: Payer: Medicare Other | Attending: Hematology

## 2021-04-12 ENCOUNTER — Inpatient Hospital Stay: Payer: Medicare Other | Admitting: Hematology

## 2021-04-12 ENCOUNTER — Other Ambulatory Visit: Payer: Self-pay

## 2021-04-12 ENCOUNTER — Inpatient Hospital Stay: Payer: Medicare Other

## 2021-04-12 VITALS — BP 124/60 | HR 83 | Temp 97.7°F | Resp 18 | Wt 160.5 lb

## 2021-04-12 VITALS — BP 114/70 | HR 60 | Temp 97.9°F | Resp 17

## 2021-04-12 DIAGNOSIS — C8208 Follicular lymphoma grade I, lymph nodes of multiple sites: Secondary | ICD-10-CM

## 2021-04-12 DIAGNOSIS — C8201 Follicular lymphoma grade I, lymph nodes of head, face, and neck: Secondary | ICD-10-CM | POA: Insufficient documentation

## 2021-04-12 DIAGNOSIS — Z7189 Other specified counseling: Secondary | ICD-10-CM

## 2021-04-12 DIAGNOSIS — Z79899 Other long term (current) drug therapy: Secondary | ICD-10-CM | POA: Insufficient documentation

## 2021-04-12 DIAGNOSIS — Z9221 Personal history of antineoplastic chemotherapy: Secondary | ICD-10-CM | POA: Insufficient documentation

## 2021-04-12 DIAGNOSIS — Z5112 Encounter for antineoplastic immunotherapy: Secondary | ICD-10-CM

## 2021-04-12 LAB — CBC WITH DIFFERENTIAL (CANCER CENTER ONLY)
Abs Immature Granulocytes: 0.01 10*3/uL (ref 0.00–0.07)
Basophils Absolute: 0 10*3/uL (ref 0.0–0.1)
Basophils Relative: 0 %
Eosinophils Absolute: 0.1 10*3/uL (ref 0.0–0.5)
Eosinophils Relative: 2 %
HCT: 37.7 % — ABNORMAL LOW (ref 39.0–52.0)
Hemoglobin: 12.2 g/dL — ABNORMAL LOW (ref 13.0–17.0)
Immature Granulocytes: 0 %
Lymphocytes Relative: 17 %
Lymphs Abs: 1.2 10*3/uL (ref 0.7–4.0)
MCH: 28.2 pg (ref 26.0–34.0)
MCHC: 32.4 g/dL (ref 30.0–36.0)
MCV: 87.1 fL (ref 80.0–100.0)
Monocytes Absolute: 0.6 10*3/uL (ref 0.1–1.0)
Monocytes Relative: 8 %
Neutro Abs: 5.3 10*3/uL (ref 1.7–7.7)
Neutrophils Relative %: 73 %
Platelet Count: 234 10*3/uL (ref 150–400)
RBC: 4.33 MIL/uL (ref 4.22–5.81)
RDW: 13.3 % (ref 11.5–15.5)
WBC Count: 7.2 10*3/uL (ref 4.0–10.5)
nRBC: 0 % (ref 0.0–0.2)

## 2021-04-12 LAB — CMP (CANCER CENTER ONLY)
ALT: 11 U/L (ref 0–44)
AST: 14 U/L — ABNORMAL LOW (ref 15–41)
Albumin: 4.1 g/dL (ref 3.5–5.0)
Alkaline Phosphatase: 55 U/L (ref 38–126)
Anion gap: 6 (ref 5–15)
BUN: 27 mg/dL — ABNORMAL HIGH (ref 8–23)
CO2: 28 mmol/L (ref 22–32)
Calcium: 9.4 mg/dL (ref 8.9–10.3)
Chloride: 106 mmol/L (ref 98–111)
Creatinine: 0.85 mg/dL (ref 0.61–1.24)
GFR, Estimated: >60 mL/min (ref >60)
Glucose, Bld: 94 mg/dL (ref 70–99)
Potassium: 4.1 mmol/L (ref 3.5–5.1)
Sodium: 140 mmol/L (ref 135–145)
Total Bilirubin: 0.8 mg/dL (ref 0.3–1.2)
Total Protein: 6.5 g/dL (ref 6.5–8.1)

## 2021-04-12 LAB — LACTATE DEHYDROGENASE: LDH: 155 U/L (ref 98–192)

## 2021-04-12 MED ORDER — ACETAMINOPHEN 325 MG PO TABS
650.0000 mg | ORAL_TABLET | Freq: Once | ORAL | Status: AC
  Administered 2021-04-12: 650 mg via ORAL
  Filled 2021-04-12: qty 2

## 2021-04-12 MED ORDER — DIPHENHYDRAMINE HCL 25 MG PO CAPS
50.0000 mg | ORAL_CAPSULE | Freq: Once | ORAL | Status: AC
  Administered 2021-04-12: 50 mg via ORAL
  Filled 2021-04-12: qty 2

## 2021-04-12 MED ORDER — SODIUM CHLORIDE 0.9 % IV SOLN: Freq: Once | INTRAVENOUS | Status: AC

## 2021-04-12 MED ORDER — FAMOTIDINE 20 MG IN NS 100 ML IVPB
20.0000 mg | Freq: Once | INTRAVENOUS | Status: AC
  Administered 2021-04-12: 20 mg via INTRAVENOUS
  Filled 2021-04-12: qty 100

## 2021-04-12 MED ORDER — SODIUM CHLORIDE 0.9 % IV SOLN
375.0000 mg/m2 | Freq: Once | INTRAVENOUS | Status: AC
  Administered 2021-04-12: 700 mg via INTRAVENOUS
  Filled 2021-04-12: qty 20

## 2021-04-12 MED ORDER — METHYLPREDNISOLONE SODIUM SUCC 125 MG IJ SOLR
125.0000 mg | Freq: Once | INTRAMUSCULAR | Status: AC
  Administered 2021-04-12: 125 mg via INTRAVENOUS
  Filled 2021-04-12: qty 2

## 2021-04-12 NOTE — Patient Instructions (Signed)
[facility]  Discharge Instructions: Thank you for choosing [facility] to provide your oncology and hematology care.   If you have a lab appointment with the Cancer Center, please go directly to the Cancer Center and check in at the registration area.   Wear comfortable clothing and clothing appropriate for easy access to any Portacath or PICC line.   We strive to give you quality time with your provider. You may need to reschedule your appointment if you arrive late (15 or more minutes).  Arriving late affects you and other patients whose appointments are after yours.  Also, if you miss three or more appointments without notifying the office, you may be dismissed from the clinic at the provider's discretion.      For prescription refill requests, have your pharmacy contact our office and allow 72 hours for refills to be completed.    Today you received the following chemotherapy and/or immunotherapy agents Rituximab      To help prevent nausea and vomiting after your treatment, we encourage you to take your nausea medication as directed.  BELOW ARE SYMPTOMS THAT SHOULD BE REPORTED IMMEDIATELY: *FEVER GREATER THAN 100.4 F (38 C) OR HIGHER *CHILLS OR SWEATING *NAUSEA AND VOMITING THAT IS NOT CONTROLLED WITH YOUR NAUSEA MEDICATION *UNUSUAL SHORTNESS OF BREATH *UNUSUAL BRUISING OR BLEEDING *URINARY PROBLEMS (pain or burning when urinating, or frequent urination) *BOWEL PROBLEMS (unusual diarrhea, constipation, pain near the anus) TENDERNESS IN MOUTH AND THROAT WITH OR WITHOUT PRESENCE OF ULCERS (sore throat, sores in mouth, or a toothache) UNUSUAL RASH, SWELLING OR PAIN  UNUSUAL VAGINAL DISCHARGE OR ITCHING   Items with * indicate a potential emergency and should be followed up as soon as possible or go to the Emergency Department if any problems should occur.  Please show the CHEMOTHERAPY ALERT CARD or IMMUNOTHERAPY ALERT CARD at check-in to  the Emergency Department and triage nurse.  Should you have questions after your visit or need to cancel or reschedule your appointment, please contact [facility]  Dept: [phone]  and follow the prompts.  Office hours are 8:00 a.m. to 4:30 p.m. Monday - Friday. Please note that voicemails left after 4:00 p.m. may not be returned until the following business day.  We are closed weekends and major holidays. You have access to a nurse at all times for urgent questions. Please call the main number to the clinic Dept: [phone] and follow the prompts.   For any non-urgent questions, you may also contact your provider using MyChart. We now offer e-Visits for anyone 18 and older to request care online for non-urgent symptoms. For details visit [email or web address].   Also download the MyChart app! Go to the app store, search "MyChart", open the app, select , and log in with your MyChart username and password.  Due to Covid, a mask is required upon entering the hospital/clinic. If you do not have a mask, one will be given to you upon arrival. For doctor visits, patients may have 1 support person aged 18 or older with them. For treatment visits, patients cannot have anyone with them due to current Covid guidelines and our immunocompromised population.   

## 2021-04-18 ENCOUNTER — Encounter: Payer: Self-pay | Admitting: Hematology

## 2021-04-18 NOTE — Progress Notes (Addendum)
HEMATOLOGY/ONCOLOGY CLINIC NOTE  Date of Service:.04/12/2021  Patient Care Team: [name], MD as PCP - General (Family Medicine) [name], MD as PCP - Cardiology (Cardiology)  CHIEF COMPLAINTS/PURPOSE OF CONSULTATION:  Continued management of low-grade follicular lymphoma and continued maintenance Rituxan    HISTORY OF PRESENTING ILLNESS:  please see previous notes for details on initial presentation.  INTERVAL HISTORY:   Harold [name] is here for continued evaluation and management of his low-grade follicular lymphoma and next dose of maintenance Rituxan. He notes no acute new symptoms.  No fevers no chills no night sweats.  No new lumps or bumps. No new skin rashes. No new toxicities from Rituxan. No new issues with infections. He is primarily stressed due to his wife's progressive dementia now necessitating her moving into a memory care unit.  He is having to adjust to the change quite significantly.  He is grateful for his family support but notes that it is still quite difficult to come to terms with living alone. Labs today done were reviewed stable.  MEDICAL HISTORY:  Past Medical History:  Diagnosis Date   BPH (benign prostatic hypertrophy)    CAD (coronary artery disease) 01/2010   S/P drug eluting stents LAD and PL branch of RCA Dr. 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 1696   follicular lymphoma dx'd 06/2017   Hernia    HLD (hyperlipidemia)    HTN (hypertension)     SURGICAL HISTORY: Past Surgical History:  Procedure Laterality Date   BACK SURGERY     CYSTOSCOPY WITH LITHOLAPAXY N/A 05/20/2019   Procedure: CYSTOSCOPY WITH LITHOLAPAXY;  Surgeon: Ardis Hughs, MD;  Location: [facility];  Service: Urology;  Laterality: N/A;   CYSTOSCOPY/URETEROSCOPY/HOLMIUM LASER/STENT PLACEMENT Left 05/20/2019   Procedure: CYSTOSCOPY/URETEROSCOPY/HOLMIUM LASER/STENT PLACEMENT;  Surgeon: Ardis Hughs, MD;   Location: [facility];  Service: Urology;  Laterality: Left;   HERNIA REPAIR  01/08/11   RIH   MASS EXCISION Right 08/05/2017   Procedure: EXCISION RIGHT POSTERIOR  NECK LYMPH NODE;  Surgeon: Rozetta Nunnery, MD;  Location: [place];  Service: ENT;  Laterality: Right;    SOCIAL HISTORY: Social History   Socioeconomic History   Marital status: Married    Spouse name: Not on file   Number of children: Not on file   Years of education: Not on file   Highest education level: Not on file  Occupational History   Not on file  Tobacco Use   Smoking status: Never   Smokeless tobacco: Never  Vaping Use   Vaping Use: Never used  Substance and Sexual Activity   Alcohol use: No   Drug use: No   Sexual activity: Not on file  Other Topics Concern   Not on file  Social History Narrative   Married   Social Determinants of Health   Financial Resource Strain: Not on file  Food Insecurity: Not on file  Transportation Needs: Not on file  Physical Activity: Not on file  Stress: Not on file  Social Connections: Not on file  Intimate Partner Violence: Not on file    FAMILY HISTORY: Family History  Problem Relation Age of Onset   Heart failure Mother     ALLERGIES:  has No Known Allergies.  MEDICATIONS:  Current Outpatient Medications  Medication Sig Dispense Refill   aspirin EC 81 MG tablet Take 1 tablet (81 mg total) by mouth daily. Swallow whole. [place]  tablet 3   atenolol (TENORMIN) 25 MG tablet Take 1 tablet (25 mg total) by mouth daily. 90 tablet 3   brimonidine (ALPHAGAN) 0.2 % ophthalmic solution Place 1 drop into the right eye 2 (two) times daily.     cholecalciferol (VITAMIN D) 1000 UNITS tablet Take 1,000 Units by mouth daily.     Cyanocobalamin (B-12) 2500 MCG TABS Take 2,500 mcg by mouth daily.     finasteride (PROSCAR) 5 MG tablet Take 5 mg by mouth daily.     furosemide (LASIX) 20 MG tablet Take 1 tablet (20 mg total) by mouth daily. 90 tablet 3    gabapentin (NEURONTIN) 100 MG capsule Take 100 mg by mouth daily as needed (knee pain).      isosorbide dinitrate (ISORDIL) 5 MG tablet Take 1 tablet (5 mg total) by mouth 2 (two) times daily. 180 tablet 3   lisinopril (ZESTRIL) 5 MG tablet Take 5 mg by mouth daily.     nitroGLYCERIN (NITROSTAT) 0.4 MG SL tablet Place 1 tablet (0.4 mg total) under the tongue every 5 (five) minutes as needed for chest pain. 25 tablet 6   potassium chloride SA (KLOR-CON) 20 MEQ tablet Take 20 mEq by mouth every other day.     pravastatin (PRAVACHOL) 40 MG tablet Take 1 tablet (40 mg total) by mouth at bedtime. 90 tablet 3   timolol (TIMOPTIC) 0.5 % ophthalmic solution Place 1 drop into the right eye daily. (Patient not taking: Reported on 11/17/2020)     No current facility-administered medications for this visit.    REVIEW OF SYSTEMS:   .10 Point review of Systems was done is negative except as noted above.  PHYSICAL EXAMINATION: ECOG FS:2 - Symptomatic, <50% confined to bed  Vitals:   04/12/21 1022  BP: 124/60  Pulse: 83  Resp: 18  Temp: 97.7 F (36.5 C)  SpO2: 100%    Wt Readings from Last 3 Encounters:  04/12/21 160 lb 8 oz (72.8 kg)  02/07/21 159 lb 1.6 oz (72.2 kg)  12/11/20 157 lb 9.6 oz (71.5 kg)   Body mass index is 23.7 kg/m.   [name] GENERAL:alert, in no acute distress and comfortable SKIN: no acute rashes, no significant lesions EYES: conjunctiva are pink and non-injected, sclera anicteric OROPHARYNX: MMM, no exudates, no oropharyngeal erythema or ulceration NECK: supple, no JVD LYMPH:  no palpable lymphadenopathy in the cervical, axillary or inguinal regions LUNGS: clear to auscultation b/l with normal respiratory effort HEART: regular rate & rhythm ABDOMEN:  normoactive bowel sounds , non tender, not distended. Extremity: no pedal edema PSYCH: alert & oriented x 3 with fluent speech NEURO: no focal motor/sensory deficits  LABORATORY DATA:  I have reviewed the data as  listed  CBC Latest Ref Rng & Units 04/12/2021 02/07/2021 12/11/2020  WBC 4.0 - 10.5 K/uL 7.2 7.4 6.4  Hemoglobin 13.0 - 17.0 g/dL 12.2(L) 12.1(L) 12.0(L)  Hematocrit 39.0 - 52.0 % 37.7(L) 37.8(L) 37.4(L)  Platelets 150 - 400 K/uL 234 223 209    CMP Latest Ref Rng & Units 04/12/2021 02/07/2021 12/11/2020  Glucose 70 - 99 mg/dL 94 86 84  BUN 8 - 23 mg/dL 27(H) 24(H) 25(H)  Creatinine 0.61 - 1.24 mg/dL 0.85 0.81 0.80  Sodium 135 - 145 mmol/L 140 142 143  Potassium 3.5 - 5.1 mmol/L 4.1 3.7 4.0  Chloride 98 - 111 mmol/L 106 106 106  CO2 22 - 32 mmol/L 28 28 26   Calcium 8.9 - 10.3 mg/dL 9.4 9.2 9.5  Total  Protein 6.5 - 8.1 g/dL 6.5 6.6 6.8  Total Bilirubin 0.3 - 1.2 mg/dL 0.8 0.8 0.9  Alkaline Phos 38 - 126 U/L 55 65 60  AST 15 - 41 U/L 14(L) 15 14(L)  ALT 0 - 44 U/L 11 13 10    Lab Results  Component Value Date   LDH 155 04/12/2021     02/18/2019 CT Abdomen Pelvis W Contrast (Accession [id number]):   02/18/2019 CT Chest W Contrast (Accession [id number]):  08/08/17 Pathology:   08/05/17 Pathology:     RADIOGRAPHIC STUDIES: I have personally reviewed the radiological images as listed and agreed with the findings in the report. No results found.   ASSESSMENT & PLAN:   87 y.o. male with  1.h/o  atleast Stage III Follicular Lymphoma Grade 1-2 presenting with rt cervical lymphadenopathy  08/05/17 pathology results of right cervical LN revealing low grade follicular lymphoma  04/26/45 PET revealed Hypermetabolic lymphadenopathy involving the right neck, bilateral axillary, right hilar and right inguinal lymph nodes. No abdominal or pelvic involvement.    03/20/18 CT C/A/P revealed Progressive bilateral axillary and right inguinal lymphadenopathy, including necrotic lymph nodes in the right axilla, as above. Spleen is normal in size. 4 mm distal left ureteral calculus, unchanged. No hydronephrosis. Stable layering bladder calculi measuring up to 5 mm. Cholelithiasis, without  associated inflammatory changes. Stable sequela of prior/chronic pancreatitis.   11/03/2018 CT neck revealed "Marked interval progression of cervical lymphadenopathy since PET-CT 09/02/2017, now with lymphadenopathy present bilaterally at essentially all stations. Index nodes as described. This includes bilateral intraparotid lymphadenopathy. Bulky right-sided adenopathy results in multifocal narrowing of the right internal jugular vein. Significant narrowing of the mid to lower left internal jugular vein of unclear cause as there is no significant mass effect at this level."  11/03/2018 CT CAP revealed "1. Progression of bilateral axillary, retroperitoneal, pelvic, and inguinal adenopathy. New anterior and posterior lower cervical adenopathy identified within the neck. 2. Persistent distal left ureteral calculus. Small bladder calculi are unchanged. 3. Aortic Atherosclerosis (ICD10-I70.0). Coronary artery calcifications. 4. Sequelae of chronic pancreatitis noted."  10/01/2019 CT Abd/Pel ([id number]) revealed no evidence of mass in right kidney, new and enlarged abdominal and pelvic lymph nodes.   2. 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 3. H/o head and neck Basal cell carcinoma Continue routine skin checks with dermatologist    4. Renal indeterminate lesion -06/28/19 Renal US ([id number]) revealed New right-sided mass at the hilum which measures 1.5cm in size.   -resolved on subsequent scans  5. Urinary stos in ureter and bladder.   PLAN: -Labs done today 04/12/2021 were reviewed with the patient. CBC stable with a WBC count of 7.2k hemoglobin of 12.2 and platelets of 234k CMP within normal limits  LDH 155  -Patient notes no new toxicities from his Rituxan and no infection issues. -He has no lab or clinical symptoms/signs suggestive of follicular lymphoma progression at this time. -His leg swelling continues to improve and he is following with his primary care physician for management of his  diuretics. -We will plan to finish Rituxan maintenance and repeat scans and potentially consider switching to surveillance mode if adequate response has been obtained.   FOLLOW UP:  Return to clinic for next cycle of maintenance Rituxan in 2 months with labs and MD visit as scheduled on 06/11/2021.  All of the patient's questions were answered with apparent satisfaction. The patient knows to call the clinic with any problems, questions or concerns.   Sullivan Lone MD [place] AAHIVMS [facility]  [facility] Hematology/Oncology Physician [facility]

## 2021-04-23 ENCOUNTER — Inpatient Hospital Stay
Admission: AD | Admit: 2021-04-23 | Payer: Medicare Other | Source: Other Acute Inpatient Hospital | Admitting: Cardiovascular Disease

## 2021-04-25 ENCOUNTER — Other Ambulatory Visit: Payer: Self-pay | Admitting: Cardiology

## 2021-04-25 DIAGNOSIS — I200 Unstable angina: Secondary | ICD-10-CM

## 2021-04-25 NOTE — Progress Notes (Signed)
Spoke with patient over the phone with 2 patient identifiers used. Discussed the plan for outpatient stress test and follow up in the office which he was agreeable to. He is being discharged from [facility] where he presented with chest pain. Will arrange for stress test and follow up.   Shared Decision Making/Informed Consent The risks [chest pain, shortness of breath, cardiac arrhythmias, dizziness, blood pressure fluctuations, myocardial infarction, stroke/transient ischemic attack, nausea, vomiting, allergic reaction, radiation exposure, metallic taste sensation and life-threatening complications (estimated to be 1 in 10,000)], benefits (risk stratification, diagnosing coronary artery disease, treatment guidance) and alternatives of a nuclear stress test were discussed in detail with Mr. Kissick and he agrees to proceed.  SignedReino Bellis, NP-C 04/25/2021, 11:28 AM

## 2021-04-26 ENCOUNTER — Telehealth (HOSPITAL_COMMUNITY): Payer: Self-pay | Admitting: *Deleted

## 2021-04-26 NOTE — Telephone Encounter (Signed)
Patient given detailed instructions per Myocardial Perfusion Study Information Sheet for the test on 04/27/2021 at 10:00. Patient notified to arrive 15 minutes early and that it is imperative to arrive on time for appointment to keep from having the test rescheduled.  If you need to cancel or reschedule your appointment, please call the office within 24 hours of your appointment. . Patient verbalized understanding.Harold [name]

## 2021-04-27 ENCOUNTER — Ambulatory Visit (HOSPITAL_COMMUNITY): Payer: Medicare Other | Attending: Cardiology

## 2021-04-27 ENCOUNTER — Other Ambulatory Visit: Payer: Self-pay

## 2021-04-27 DIAGNOSIS — I200 Unstable angina: Secondary | ICD-10-CM | POA: Diagnosis present

## 2021-04-27 LAB — MYOCARDIAL PERFUSION IMAGING
Base ST Depression (mm): 0 mm
LV dias vol: 97 mL (ref 62–150)
LV sys vol: 33 mL
Nuc Stress EF: 67 %
Peak HR: 78 {beats}/min
Rest HR: 65 {beats}/min
Rest Nuclear Isotope Dose: 10.2 mCi
SDS: 5
SRS: 0
SSS: 5
ST Depression (mm): 0 mm
Stress Nuclear Isotope Dose: 29.7 mCi
TID: 1.07

## 2021-04-27 MED ORDER — TECHNETIUM TC 99M TETROFOSMIN IV KIT
29.7000 | PACK | Freq: Once | INTRAVENOUS | Status: AC | PRN
  Administered 2021-04-27: 29.7 via INTRAVENOUS
  Filled 2021-04-27: qty 30

## 2021-04-27 MED ORDER — TECHNETIUM TC 99M TETROFOSMIN IV KIT
10.2000 | PACK | Freq: Once | INTRAVENOUS | Status: AC | PRN
  Administered 2021-04-27: 10.2 via INTRAVENOUS
  Filled 2021-04-27: qty 11

## 2021-04-27 MED ORDER — REGADENOSON 0.4 MG/5ML IV SOLN
0.4000 mg | Freq: Once | INTRAVENOUS | Status: AC
  Administered 2021-04-27: 0.4 mg via INTRAVENOUS

## 2021-05-02 NOTE — Progress Notes (Signed)
Cardiology Office Note    Date:  05/14/2021   ID:  Harold [name], DOB [birth year], MRN [id number]   PCP:  Harold Every, MD   [place]  Cardiologist:  Lauree Chandler, MD   Advanced Practice Provider:  No care team member to display Electrophysiologist:  None   [phone]   Chief Complaint  Patient presents with   Hospitalization Follow-up    History of Present Illness:  Harold [name] is a 87 y.o. male history of CAD status post DES to the posterior lateral branch of the RCA and mid LAD 2011 hypertension, lymphoma,DVT 04/2020.  Patient was seen at [facility] 04/23/2021 and said to have an NSTEMI with elevated troponins and resolution of chest pain.  They recommended outpatient stress test.  Chart reviewed troponin 0.034, 0.070, 0.122.  Was started on heparin and awaited bed at [facility] but ended up being discharged for outpatient stress test.  NST 04/27/2021 low risk with partially reversible apical defect likely represents diaphragmatic attenuation but cannot rule out small area of apical ischemia.  Patient comes in for f/u with his daughter. Denies any further chest pain. Not doing any heavy lifting. Was splitting wood and very active before Christmas. Uses a walker to walk because of knee pain.Used weed eater last week without any chest pain PCP decreased lisinopril to 5 mg. His wife was put in a memory care center and now has pneumonia.    Past Medical History:  Diagnosis Date   BPH (benign prostatic hypertrophy)    CAD (coronary artery disease) 01/2010   S/P drug eluting stents LAD and PL branch of RCA Dr. Olevia Perches   DVT (deep venous thrombosis) ([place])    Peroneal vein thrombus February 4132   follicular lymphoma dx'd 06/2017   Hernia    HLD (hyperlipidemia)    HTN (hypertension)     Past Surgical History:  Procedure Laterality Date   BACK SURGERY     CYSTOSCOPY WITH LITHOLAPAXY N/A 05/20/2019   Procedure: CYSTOSCOPY WITH LITHOLAPAXY;   Surgeon: Ardis Hughs, MD;  [facility]: [facility];  Service: Urology;  Laterality: N/A;   CYSTOSCOPY/URETEROSCOPY/HOLMIUM LASER/STENT PLACEMENT Left 05/20/2019   Procedure: CYSTOSCOPY/URETEROSCOPY/HOLMIUM LASER/STENT PLACEMENT;  Surgeon: Ardis Hughs, MD;  [facility];  Service: Urology;  Laterality: Left;   HERNIA REPAIR  01/08/11   RIH   MASS EXCISION Right 08/05/2017   Procedure: EXCISION RIGHT POSTERIOR  NECK LYMPH NODE;  Surgeon: Rozetta Nunnery, MD;  Location: [place];  Service: ENT;  Laterality: Right;    Current Medications: Current Meds  Medication Sig   aspirin EC 81 MG tablet Take 1 tablet (81 mg total) by mouth daily. Swallow whole.   atenolol (TENORMIN) 25 MG tablet Take 1 tablet (25 mg total) by mouth daily.   brimonidine (ALPHAGAN) 0.2 % ophthalmic solution Place 1 drop into the right eye 2 (two) times daily.   cholecalciferol (VITAMIN D) 1000 UNITS tablet Take 1,000 Units by mouth daily.   Cyanocobalamin (B-12) 2500 MCG TABS Take 2,500 mcg by mouth daily.   finasteride (PROSCAR) 5 MG tablet Take 5 mg by mouth daily.   furosemide (LASIX) 20 MG tablet Take 1 tablet (20 mg total) by mouth daily.   gabapentin (NEURONTIN) 100 MG capsule Take 100 mg by mouth daily as needed (knee pain).    isosorbide dinitrate (ISORDIL) 5 MG tablet Take 1 tablet (5 mg total) by mouth 2 (two) times daily.   lisinopril (ZESTRIL) 5  MG tablet Take 2.5 mg by mouth daily.   nitroGLYCERIN (NITROSTAT) 0.4 MG SL tablet Place 1 tablet (0.4 mg total) under the tongue [name] 5 (five) minutes as needed for chest pain.   pravastatin (PRAVACHOL) 40 MG tablet Take 1 tablet (40 mg total) by mouth at bedtime.   timolol (TIMOPTIC) 0.5 % ophthalmic solution Place 1 drop into the right eye daily.   [DISCONTINUED] potassium chloride SA (KLOR-CON) 20 MEQ tablet Take 20 mEq by mouth [name] other day.     Allergies:   Patient has no known allergies.   Social History   Socioeconomic  History   Marital status: Married    Spouse name: Not on file   Number of children: Not on file   Years of education: Not on file   Highest education level: Not on file  Occupational History   Not on file  Tobacco Use   Smoking status: Never   Smokeless tobacco: Never  Vaping Use   Vaping Use: Never used  Substance and Sexual Activity   Alcohol use: No   Drug use: No   Sexual activity: Not on file  Other Topics Concern   Not on file  Social History Narrative   Married   Social Determinants of Health   Financial Resource Strain: Not on file  Food Insecurity: Not on file  Transportation Needs: Not on file  Physical Activity: Not on file  Stress: Not on file  Social Connections: Not on file     Family History:  The patient's  family history includes Heart failure in his mother.   ROS:   Please see the history of present illness.    ROS All other systems reviewed and are negative.   PHYSICAL EXAM:   VS:  BP 124/70    Pulse 70    Ht 5' 9.5" (1.765 m)    Wt 162 lb 3.2 oz (73.6 kg)    SpO2 98%    BMI 23.61 kg/m   Physical Exam  GEN: Thin, in no acute distress  Neck: no JVD, carotid bruits, or masses Cardiac:RRR; no murmurs, rubs, or gallops  Respiratory:  clear to auscultation bilaterally, normal work of breathing GI: soft, nontender, nondistended, + BS Ext: trace ankle edema without cyanosis, clubbing, Good distal pulses bilaterally Neuro:  Alert and Oriented x 3 Psych: euthymic mood, full affect  Wt Readings from Last 3 Encounters:  05/14/21 162 lb 3.2 oz (73.6 kg)  04/12/21 160 lb 8 oz (72.8 kg)  02/07/21 159 lb 1.6 oz (72.2 kg)      Studies/Labs Reviewed:   EKG:  EKG is not ordered today.    Recent Labs: 04/12/2021: ALT 11; BUN 27; Creatinine 0.85; Hemoglobin 12.2; Platelet Count 234; Potassium 4.1; Sodium 140   Lipid Panel    Component Value Date/Time   CHOL 130 11/17/2020 0955   TRIG 62 11/17/2020 0955   HDL 75 11/17/2020 0955   CHOLHDL 1.7  11/17/2020 0955   LDLCALC 42 11/17/2020 0955    Additional studies/ records that were reviewed today include:  NST 04/27/2021   ECG is normal. Resting ECG shows no ST-segment deviation.   No ST deviation was noted. There were no arrhythmias during stress. ECG was interpretable and without significant changes. The ECG was not diagnostic due to pharmacologic protocol.   LV perfusion is abnormal. Defect 1: There is a small defect with mild reduction in uptake present in the apical apex location(s) that is partially reversible. There is normal wall motion  in the defect area.   Left ventricular function is normal. Nuclear stress EF: 67 %. The left ventricular ejection fraction is hyperdynamic (>65%). End diastolic cavity size is normal. End systolic cavity size is normal.   Partially reversible apical defect likely represents variations in diaphragmatic attenuation artifact but cannot rule out a small area of apical ischemia. The study is low risk.     Risk Assessment/Calculations:         ASSESSMENT:    1. Coronary artery disease involving native coronary artery of native heart without angina pectoris   2. Essential hypertension   3. Hyperlipidemia, unspecified hyperlipidemia type      PLAN:  In order of problems listed above:  CAD status post DES to the PLA and mid LAD in 2011, NSTEMI 04/23/2021 with mildly elevated troponins.  NST 04/27/2021 low risk partially reversible apical defect likely represents diaphragmatic attenuation but cannot rule out small area of apical ischemia-no further angina. Under a lot of stress with wife in memory care and pneumonia. Continue medical therapy unless he has recurrent symptoms   Hypertension BP was running low and lisinopril decreased by PCP. Labs stable 04/12/21  Hyperlipidemia LDL 42 11/17/20 on pravachol.  Shared Decision Making/Informed Consent        Medication Adjustments/Labs and Tests Ordered: Current medicines are reviewed at length with  the patient today.  Concerns regarding medicines are outlined above.  Medication changes, Labs and Tests ordered today are listed in the Patient Instructions below. Patient Instructions  Medication Instructions:  Your physician recommends that you continue on your current medications as directed. Please refer to the Current Medication list given to you today.  *If you need a refill on your cardiac medications before your next appointment, please call your pharmacy*   Lab Work: None ordered  If you have labs (blood work) drawn today and your tests are completely normal, you will receive your results only by: [place] (if you have MyChart) OR A paper copy in the mail If you have any lab test that is abnormal or we need to change your treatment, we will call you to review the results.   Testing/Procedures: None ordered   Follow-Up: At [facility], you and your health needs are our priority.  As part of our continuing mission to provide you with exceptional heart care, we have created designated Provider Care Teams.  These Care Teams include your primary Cardiologist (physician) and Advanced Practice Providers (APPs -  Physician Assistants and Nurse Practitioners) who all work together to provide you with the care you need, when you need it.  We recommend signing up for the patient portal called "MyChart".  Sign up information is provided on this After Visit Summary.  MyChart is used to connect with patients for Virtual Visits (Telemedicine).  Patients are able to view lab/test results, encounter notes, upcoming appointments, etc.  Non-urgent messages can be sent to your provider as well.   To learn more about what you can do with MyChart, go to [email or web address].    Your next appointment:   3 month(s)  08/09/21  The format for your next appointment:   In Person  Provider:   Lauree Chandler, MD   Christen Bame, NP   Other Instructions Call if you have  recurring chest pain    Signed, Ermalinda Barrios, PA-C  05/14/2021 11:28 AM    [address], [place], [place]  [id number] Phone: [phone]; Fax: [phone]

## 2021-05-03 ENCOUNTER — Encounter (HOSPITAL_COMMUNITY): Payer: Medicare Other

## 2021-05-14 ENCOUNTER — Ambulatory Visit: Payer: Medicare Other | Admitting: Physician Assistant

## 2021-05-14 ENCOUNTER — Encounter: Payer: Self-pay | Admitting: Physician Assistant

## 2021-05-14 ENCOUNTER — Other Ambulatory Visit: Payer: Self-pay

## 2021-05-14 VITALS — BP 124/70 | HR 70 | Ht 69.5 in | Wt 162.2 lb

## 2021-05-14 DIAGNOSIS — I10 Essential (primary) hypertension: Secondary | ICD-10-CM

## 2021-05-14 DIAGNOSIS — I2510 Atherosclerotic heart disease of native coronary artery without angina pectoris: Secondary | ICD-10-CM

## 2021-05-14 DIAGNOSIS — E785 Hyperlipidemia, unspecified: Secondary | ICD-10-CM | POA: Diagnosis not present

## 2021-05-14 MED ORDER — POTASSIUM CHLORIDE CRYS ER 20 MEQ PO TBCR: 20.0000 meq | EXTENDED_RELEASE_TABLET | ORAL | 1 refills | Status: DC

## 2021-05-14 NOTE — Patient Instructions (Addendum)
Medication Instructions:  Your physician recommends that you continue on your current medications as directed. Please refer to the Current Medication list given to you today.  *If you need a refill on your cardiac medications before your next appointment, please call your pharmacy*   Lab Work: None ordered  If you have labs (blood work) drawn today and your tests are completely normal, you will receive your results only by: [place] (if you have MyChart) OR A paper copy in the mail If you have any lab test that is abnormal or we need to change your treatment, we will call you to review the results.   Testing/Procedures: None ordered   Follow-Up: At [facility], you and your health needs are our priority.  As part of our continuing mission to provide you with exceptional heart care, we have created designated Provider Care Teams.  These Care Teams include your primary Cardiologist (physician) and Advanced Practice Providers (APPs -  Physician Assistants and Nurse Practitioners) who all work together to provide you with the care you need, when you need it.  We recommend signing up for the patient portal called "MyChart".  Sign up information is provided on this After Visit Summary.  MyChart is used to connect with patients for Virtual Visits (Telemedicine).  Patients are able to view lab/test results, encounter notes, upcoming appointments, etc.  Non-urgent messages can be sent to your provider as well.   To learn more about what you can do with MyChart, go to [email or web address].    Your next appointment:   3 month(s)  08/09/21  The format for your next appointment:   In Person  Provider:   Lauree Chandler, MD   Christen Bame, NP   Other Instructions Call if you have recurring chest pain

## 2021-06-11 ENCOUNTER — Other Ambulatory Visit: Payer: Self-pay

## 2021-06-11 ENCOUNTER — Inpatient Hospital Stay: Payer: Medicare Other | Attending: Hematology | Admitting: Hematology

## 2021-06-11 ENCOUNTER — Inpatient Hospital Stay: Payer: Medicare Other

## 2021-06-11 VITALS — BP 124/48 | HR 66 | Temp 98.5°F | Resp 17 | Ht 69.51 in | Wt 164.6 lb

## 2021-06-11 DIAGNOSIS — C8201 Follicular lymphoma grade I, lymph nodes of head, face, and neck: Secondary | ICD-10-CM | POA: Diagnosis present

## 2021-06-11 DIAGNOSIS — C8208 Follicular lymphoma grade I, lymph nodes of multiple sites: Secondary | ICD-10-CM

## 2021-06-11 LAB — CBC WITH DIFFERENTIAL (CANCER CENTER ONLY)
Abs Immature Granulocytes: 0.03 10*3/uL (ref 0.00–0.07)
Basophils Absolute: 0 10*3/uL (ref 0.0–0.1)
Basophils Relative: 0 %
Eosinophils Absolute: 0.2 10*3/uL (ref 0.0–0.5)
Eosinophils Relative: 3 %
HCT: 36.2 % — ABNORMAL LOW (ref 39.0–52.0)
Hemoglobin: 11.7 g/dL — ABNORMAL LOW (ref 13.0–17.0)
Immature Granulocytes: 0 %
Lymphocytes Relative: 15 %
Lymphs Abs: 1.4 10*3/uL (ref 0.7–4.0)
MCH: 27.9 pg (ref 26.0–34.0)
MCHC: 32.3 g/dL (ref 30.0–36.0)
MCV: 86.2 fL (ref 80.0–100.0)
Monocytes Absolute: 1 10*3/uL (ref 0.1–1.0)
Monocytes Relative: 10 %
Neutro Abs: 6.6 10*3/uL (ref 1.7–7.7)
Neutrophils Relative %: 72 %
Platelet Count: 236 10*3/uL (ref 150–400)
RBC: 4.2 MIL/uL — ABNORMAL LOW (ref 4.22–5.81)
RDW: 13.5 % (ref 11.5–15.5)
WBC Count: 9.2 10*3/uL (ref 4.0–10.5)
nRBC: 0 % (ref 0.0–0.2)

## 2021-06-11 LAB — CMP (CANCER CENTER ONLY)
ALT: 9 U/L (ref 0–44)
AST: 13 U/L — ABNORMAL LOW (ref 15–41)
Albumin: 3.9 g/dL (ref 3.5–5.0)
Alkaline Phosphatase: 60 U/L (ref 38–126)
Anion gap: 6 (ref 5–15)
BUN: 28 mg/dL — ABNORMAL HIGH (ref 8–23)
CO2: 28 mmol/L (ref 22–32)
Calcium: 9.3 mg/dL (ref 8.9–10.3)
Chloride: 105 mmol/L (ref 98–111)
Creatinine: 0.92 mg/dL (ref 0.61–1.24)
GFR, Estimated: >60 mL/min (ref >60)
Glucose, Bld: 95 mg/dL (ref 70–99)
Potassium: 4.2 mmol/L (ref 3.5–5.1)
Sodium: 139 mmol/L (ref 135–145)
Total Bilirubin: 0.7 mg/dL (ref 0.3–1.2)
Total Protein: 6.5 g/dL (ref 6.5–8.1)

## 2021-06-11 LAB — LACTATE DEHYDROGENASE: LDH: 154 U/L (ref 98–192)

## 2021-06-17 ENCOUNTER — Encounter: Payer: Self-pay | Admitting: Hematology

## 2021-06-17 NOTE — Progress Notes (Signed)
? ? ?HEMATOLOGY/ONCOLOGY CLINIC NOTE ? ?Date of Service:.06/11/2021 ? ?Patient Care Team: ?[name], MD as PCP - General (Family Medicine) ?[name], MD as PCP - Cardiology (Cardiology) ? ?CHIEF COMPLAINTS/PURPOSE OF CONSULTATION:  ?Follow-up for continued evaluation and management of low-grade follicular lymphoma ? ? ?HISTORY OF PRESENTING ILLNESS:  ?please see previous notes for details on initial presentation. ? ?INTERVAL HISTORY:  ? ?Harold [name] is here for his scheduled follow-up for continued evaluation and management of low-grade follicular lymphoma and his next dose of maintenance Rituxan. ? ?Since his last visit patient was admitted to the hospital on April 23, 2021 for non-STEMI which was treated medically.  Patient notes no further chest pains at this time.  He continues to follow-up with cardiology for optimization of medical management of his CAD. ? ?Patient did have a stress test as outpatient which shows left ventricular ejection fraction of 65%.  Partial reversible apical defect possible small area of apical ischemia.  Overall study is low risk. ? ?Patient notes no fevers no chills no night sweats no new lumps or bumps. ?No other symptoms of lymphoma progression at this time. ? ?Labs done today were reviewed in detail. ? ?MEDICAL HISTORY:  ?Past Medical History:  ?Diagnosis Date  ? BPH (benign prostatic hypertrophy)   ? CAD (coronary artery disease) 01/2010  ? S/P drug eluting stents LAD and PL branch of RCA Dr. Olevia Perches  ? DVT (deep venous thrombosis) ([place])   ? 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 follicular lymphoma dx'd 06/2017  ? Hernia   ? HLD (hyperlipidemia)   ? HTN (hypertension)   ? ? ?SURGICAL HISTORY: ?Past Surgical History:  ?Procedure Laterality Date  ? BACK SURGERY    ? CYSTOSCOPY WITH LITHOLAPAXY N/A 05/20/2019  ? Procedure: CYSTOSCOPY WITH LITHOLAPAXY;  Surgeon: Ardis Hughs, MD;  Location: [facility];  Service: Urology;  Laterality: N/A;  ?  CYSTOSCOPY/URETEROSCOPY/HOLMIUM LASER/STENT PLACEMENT Left 05/20/2019  ? Procedure: CYSTOSCOPY/URETEROSCOPY/HOLMIUM LASER/STENT PLACEMENT;  Surgeon: Ardis Hughs, MD;  Location: [facility];  Service: Urology;  Laterality: Left;  ? HERNIA REPAIR  01/08/11  ? RIH  ? MASS EXCISION Right 08/05/2017  ? Procedure: EXCISION RIGHT POSTERIOR  NECK LYMPH NODE;  Surgeon: Rozetta Nunnery, MD;  Location: [place];  Service: ENT;  Laterality: Right;  ? ? ?SOCIAL HISTORY: ?Social History  ? ?Socioeconomic History  ? Marital status: Married  ?  Spouse name: Not on file  ? Number of children: Not on file  ? Years of education: Not on file  ? Highest education level: Not on file  ?Occupational History  ? Not on file  ?Tobacco Use  ? Smoking status: Never  ? Smokeless tobacco: Never  ?Vaping Use  ? Vaping Use: Never used  ?Substance and Sexual Activity  ? Alcohol use: No  ? Drug use: No  ? Sexual activity: Not on file  ?Other Topics Concern  ? Not on file  ?Social History Narrative  ? Married  ? ?Social Determinants of Health  ? ?Financial Resource Strain: Not on file  ?Food Insecurity: Not on file  ?Transportation Needs: Not on file  ?Physical Activity: Not on file  ?Stress: Not on file  ?Social Connections: Not on file  ?Intimate Partner Violence: Not on file  ? ? ?FAMILY HISTORY: ?Family History  ?Problem Relation Age of Onset  ? Heart failure Mother   ? ? ?ALLERGIES:  has No Known Allergies. ? ?MEDICATIONS:  ?Current Outpatient Medications  ?Medication  Sig Dispense Refill  ? aspirin EC 81 MG tablet Take 1 tablet (81 mg total) by mouth daily. Swallow whole. 90 tablet 3  ? atenolol (TENORMIN) 25 MG tablet Take 1 tablet (25 mg total) by mouth daily. 90 tablet 3  ? brimonidine (ALPHAGAN) 0.2 % ophthalmic solution Place 1 drop into the right eye 2 (two) times daily.    ? cholecalciferol (VITAMIN D) 1000 UNITS tablet Take 1,000 Units by mouth daily.    ? finasteride (PROSCAR) 5 MG tablet Take 5 mg by mouth daily.     ? furosemide (LASIX) 20 MG tablet Take 1 tablet (20 mg total) by mouth daily. 90 tablet 3  ? gabapentin (NEURONTIN) 100 MG capsule Take 100 mg by mouth daily as needed (knee pain).     ? isosorbide dinitrate (ISORDIL) 5 MG tablet Take 1 tablet (5 mg total) by mouth 2 (two) times daily. 180 tablet 3  ? lisinopril (ZESTRIL) 5 MG tablet Take 2.5 mg by mouth daily.    ? nitroGLYCERIN (NITROSTAT) 0.4 MG SL tablet Place 1 tablet (0.4 mg total) under the tongue every 5 (five) minutes as needed for chest pain. 25 tablet 6  ? potassium chloride SA (KLOR-CON M) 20 MEQ tablet Take 1 tablet (20 mEq total) by mouth every other day. 60 tablet 1  ? pravastatin (PRAVACHOL) 40 MG tablet Take 1 tablet (40 mg total) by mouth at bedtime. 90 tablet 3  ? timolol (TIMOPTIC) 0.5 % ophthalmic solution Place 1 drop into the right eye daily.    ? Cyanocobalamin (B-12) 2500 MCG TABS Take 2,500 mcg by mouth daily.    ? ?No current facility-administered medications for this visit.  ? ? ?REVIEW OF SYSTEMS:   ?10 Point review of Systems was done is negative except as noted above. ? ?PHYSICAL EXAMINATION: ?ECOG FS:2 - Symptomatic, <50% confined to bed ? ?Vitals:  ? 06/11/21 1023  ?BP: (!) 124/48  ?Pulse: 66  ?Resp: 17  ?Temp: 98.5 ?F (36.9 ?C)  ?SpO2: 100%  ? ? ?Wt Readings from Last 3 Encounters:  ?06/11/21 164 lb 9.6 oz (74.7 kg)  ?05/14/21 162 lb 3.2 oz (73.6 kg)  ?04/12/21 160 lb 8 oz (72.8 kg)  ? ?Body mass index is 23.95 kg/m?[name]   ?NAD ?GENERAL:alert, in no acute distress and comfortable ?SKIN: no acute rashes, no significant lesions ?EYES: conjunctiva are pink and non-injected, sclera anicteric ?OROPHARYNX: MMM, no exudates, no oropharyngeal erythema or ulceration ?NECK: supple, no JVD ?LYMPH:  no palpable lymphadenopathy in the cervical, axillary or inguinal regions ?LUNGS: clear to auscultation b/l with normal respiratory effort ?HEART: regular rate & rhythm ?ABDOMEN:  normoactive bowel sounds , non tender, not distended. ?Extremity: no  pedal edema ?PSYCH: alert & oriented x 3 with fluent speech ?NEURO: no focal motor/sensory deficits ? ?LABORATORY DATA:  ?I have reviewed the data as listed ? ? ?  Latest Ref Rng & Units 06/11/2021  ?  9:27 AM 04/12/2021  ?  9:43 AM 02/07/2021  ?  9:25 AM  ?CBC  ?WBC 4.0 - 10.5 K/uL 9.2   7.2   7.4    ?Hemoglobin 13.0 - 17.0 g/dL 11.7   12.2   12.1    ?Hematocrit 39.0 - 52.0 % 36.2   37.7   37.8    ?Platelets 150 - 400 K/uL 236   234   223    ? ? ? ?  Latest Ref Rng & Units 06/11/2021  ?  9:27 AM 04/12/2021  ?  9:43 AM 02/07/2021  ?  9:25 AM  ?CMP  ?Glucose 70 - 99 mg/dL 95   94   86    ?BUN 8 - 23 mg/dL '28   27   24    '$ ?Creatinine 0.61 - 1.24 mg/dL 0.92   0.85   0.81    ?Sodium 135 - 145 mmol/L 139   140   142    ?Potassium 3.5 - 5.1 mmol/L 4.2   4.1   3.7    ?Chloride 98 - 111 mmol/L 105   106   106    ?CO2 22 - 32 mmol/L '28   28   28    '$ ?Calcium 8.9 - 10.3 mg/dL 9.3   9.4   9.2    ?Total Protein 6.5 - 8.1 g/dL 6.5   6.5   6.6    ?Total Bilirubin 0.3 - 1.2 mg/dL 0.7   0.8   0.8    ?Alkaline Phos 38 - 126 U/L 60   55   65    ?AST 15 - 41 U/L '13   14   15    '$ ?ALT 0 - 44 U/L '9   11   13    '$ ? ?Lab Results  ?Component Value Date  ? LDH 154 06/11/2021  ? ? ? ?02/18/2019 CT Abdomen Pelvis W Contrast (Accession [id number]): ? ? ?02/18/2019 CT Chest W Contrast (Accession [id number]): ? ?08/08/17 Pathology: ? ? ?08/05/17 Pathology: ?  ? ? ?RADIOGRAPHIC STUDIES: ?I have personally reviewed the radiological images as listed and agreed with the findings in the report. ?No results found. ? ? ?ASSESSMENT & PLAN:  ? ?87 y.o. male with ? ?1.h/o  atleast Stage III Follicular Lymphoma Grade 1-2 presenting with rt cervical lymphadenopathy ? 08/05/17 pathology results of right cervical LN revealing low grade follicular lymphoma ? ?09/02/17 PET revealed Hypermetabolic lymphadenopathy involving the right neck, bilateral axillary, right hilar and right inguinal lymph nodes. No abdominal or pelvic involvement.   ? ?03/20/18 CT C/A/P revealed  Progressive bilateral axillary and right inguinal lymphadenopathy, including necrotic lymph nodes in the right axilla, as above. Spleen is normal in size. 4 mm distal left ureteral calculus, unchanged. No hydronephrosis. S

## 2021-07-03 NOTE — Progress Notes (Signed)
? ?Cardiology Office Note   ? ?Date:  07/11/2021  ? ?ID:  Harold [name], DOB 03/27/1934, MRN [id number] ? ? ?PCP:  [name], MD ?  ?[place]  ?Cardiologist:  Lauree Chandler, MD   ?Advanced Practice Provider:  No care team member to display ?Electrophysiologist:  None  ? ?[id number]}  ? ?Chief Complaint  ?Patient presents with  ? Follow-up  ? ? ?History of Present Illness:  ?Harold [name] is a 87 y.o. male  with history of CAD status post DES to the posterior lateral branch of the RCA and mid LAD 2011 hypertension, lymphoma,DVT 04/2020. ?  ?Patient was seen at [facility] 04/23/2021 and said to have an NSTEMI with elevated troponins and resolution of chest pain.  They recommended outpatient stress test.  Chart reviewed troponin 0.034, 0.070, 0.122.  Was started on heparin and awaited bed at [facility] but ended up being discharged for outpatient stress test.  NST 04/27/2021 low risk with partially reversible apical defect likely represents diaphragmatic attenuation but cannot rule out small area of apical ischemia. ? ?I saw the patient 05/14/21 and he was doing well. Under stress with wife in memory care. ? ?Patient comes in for f/u. Wife died 06-23-22. No further chest pain, dyspnea, dizziness. Has some edema. Mowed his yard yesterday. Rides a tractor. Getting prepared meals from [employer]. Some is very salty. Eats [place] bacon every am.  ? ?Past Medical History:  ?Diagnosis Date  ? BPH (benign prostatic hypertrophy)   ? CAD (coronary artery disease) 01/2010  ? S/P drug eluting stents LAD and PL branch of RCA Dr. Olevia Perches  ? DVT (deep venous thrombosis) ([place])   ? 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 follicular lymphoma dx'd 06/2017  ? Hernia   ? HLD (hyperlipidemia)   ? HTN (hypertension)   ? ? ?Past Surgical History:  ?Procedure Laterality Date  ? BACK SURGERY    ? CYSTOSCOPY WITH LITHOLAPAXY N/A 05/20/2019  ? Procedure: CYSTOSCOPY WITH LITHOLAPAXY;  Surgeon: Ardis Hughs, MD;   Location: [facility];  Service: Urology;  Laterality: N/A;  ? CYSTOSCOPY/URETEROSCOPY/HOLMIUM LASER/STENT PLACEMENT Left 05/20/2019  ? Procedure: CYSTOSCOPY/URETEROSCOPY/HOLMIUM LASER/STENT PLACEMENT;  Surgeon: Ardis Hughs, MD;  Location: [facility];  Service: Urology;  Laterality: Left;  ? HERNIA REPAIR  01/08/11  ? RIH  ? MASS EXCISION Right 08/05/2017  ? Procedure: EXCISION RIGHT POSTERIOR  NECK LYMPH NODE;  Surgeon: Rozetta Nunnery, MD;  Location: [place];  Service: ENT;  Laterality: Right;  ? ? ?Current Medications: ?Current Meds  ?Medication Sig  ? aspirin EC 81 MG tablet Take 1 tablet (81 mg total) by mouth daily. Swallow whole.  ? atenolol (TENORMIN) 25 MG tablet Take 1 tablet (25 mg total) by mouth daily.  ? brimonidine (ALPHAGAN) 0.2 % ophthalmic solution Place 1 drop into the right eye 2 (two) times daily.  ? cholecalciferol (VITAMIN D) 1000 UNITS tablet Take 1,000 Units by mouth daily.  ? Cyanocobalamin (B-12) 2500 MCG TABS Take 2,500 mcg by mouth daily.  ? finasteride (PROSCAR) 5 MG tablet Take 5 mg by mouth daily.  ? isosorbide dinitrate (ISORDIL) 5 MG tablet Take 1 tablet (5 mg total) by mouth 2 (two) times daily.  ? lisinopril (ZESTRIL) 5 MG tablet Take 5 mg by mouth daily.  ? pravastatin (PRAVACHOL) 40 MG tablet Take 1 tablet (40 mg total) by mouth at bedtime.  ? [DISCONTINUED] furosemide (LASIX) 20 MG tablet Take 1 tablet (20  mg total) by mouth daily.  ? [DISCONTINUED] potassium chloride SA (KLOR-CON M) 20 MEQ tablet Take 1 tablet (20 mEq total) by mouth every other day.  ?  ? ?Allergies:   Patient has no known allergies.  ? ?Social History  ? ?Socioeconomic History  ? Marital status: Married  ?  Spouse name: Not on file  ? Number of children: Not on file  ? Years of education: Not on file  ? Highest education level: Not on file  ?Occupational History  ? Not on file  ?Tobacco Use  ? Smoking status: Never  ? Smokeless tobacco: Never  ?Vaping Use  ? Vaping Use: Never used   ?Substance and Sexual Activity  ? Alcohol use: No  ? Drug use: No  ? Sexual activity: Not on file  ?Other Topics Concern  ? Not on file  ?Social History Narrative  ? Married  ? ?Social Determinants of Health  ? ?Financial Resource Strain: Not on file  ?Food Insecurity: Not on file  ?Transportation Needs: Not on file  ?Physical Activity: Not on file  ?Stress: Not on file  ?Social Connections: Not on file  ?  ? ?Family History:  The patient's  family history includes Heart failure in his mother.  ? ?ROS:   ?Please see the history of present illness.    ?ROS All other systems reviewed and are negative. ? ? ?PHYSICAL EXAM:   ?VS:  BP (!) 124/58   Pulse 72   Ht 5' 9.51" (1.766 m)   Wt 165 lb 9.6 oz (75.1 kg)   SpO2 99%   BMI 24.10 kg/m?   ?Physical Exam  ?GEN: Thin, in no acute distress  ?Neck: no JVD, carotid bruits, or masses ?Cardiac:RRR; no murmurs, rubs, or gallops  ?Respiratory:  clear to auscultation bilaterally, normal work of breathing ?GI: soft, nontender, nondistended, + BS ?Ext: plus 2-3 edema bilaterally, without cyanosis, clubbing, or  Good distal pulses bilaterally ?Neuro:  Alert and Oriented x 3, ?Psych: euthymic mood, full affect ? ?Wt Readings from Last 3 Encounters:  ?07/11/21 165 lb 9.6 oz (75.1 kg)  ?06/11/21 164 lb 9.6 oz (74.7 kg)  ?05/14/21 162 lb 3.2 oz (73.6 kg)  ?  ? ? ?Studies/Labs Reviewed:  ? ?EKG:  EKG is  ordered today.  The ekg ordered today demonstrates NSR, normal EKG ? ?Recent Labs: ?06/11/2021: ALT 9; BUN 28; Creatinine 0.92; Hemoglobin 11.7; Platelet Count 236; Potassium 4.2; Sodium 139  ? ?Lipid Panel ?   ?Component Value Date/Time  ? CHOL 130 11/17/2020 0955  ? TRIG 62 11/17/2020 0955  ? HDL 75 11/17/2020 0955  ? CHOLHDL 1.7 11/17/2020 0955  ? [place] 42 11/17/2020 0955  ? ? ?Additional studies/ records that were reviewed today include:  ? NST 04/27/2021 ?  ECG is normal. Resting ECG shows no ST-segment deviation. ?  No ST deviation was noted. There were no arrhythmias during  stress. ECG was interpretable and without significant changes. The ECG was not diagnostic due to pharmacologic protocol. ?  LV perfusion is abnormal. Defect 1: There is a small defect with mild reduction in uptake present in the apical apex location(s) that is partially reversible. There is normal wall motion in the defect area. ?  Left ventricular function is normal. Nuclear stress EF: 67 %. The left ventricular ejection fraction is hyperdynamic (>65%). End diastolic cavity size is normal. End systolic cavity size is normal. ?  Partially reversible apical defect likely represents variations in diaphragmatic attenuation artifact but cannot  rule out a small area of apical ischemia. The study is low risk. ?  ?  ? ? ?Risk Assessment/Calculations:   ?  ? ? ? ? ?ASSESSMENT:   ? ?1. Coronary artery disease involving native coronary artery of native heart without angina pectoris   ?2. Essential hypertension   ?3. Hyperlipidemia, unspecified hyperlipidemia type   ?4. Lower extremity edema   ? ? ? ?PLAN:  ?In order of problems listed above: ? ?  ?CAD status post DES to the PLA and mid LAD in 2011, NSTEMI 04/23/2021 with mildly elevated troponins.  NST 04/27/2021 low risk partially reversible apical defect likely represents diaphragmatic attenuation but cannot rule out small area of apical ischemia-no further angina. Under a lot of stress with wife in memory care and pneumonia. Continue medical therapy unless he has recurrent symptoms  ? ?Chronic LE edema on low dose lasix 20 mg in am. Getting extra salt. 2 gm sodium diet. Can take lasix 40 mg daily if needed. And increase K 2 daily. ?  ?Hypertension BP was running low and lisinopril decreased by PCP. Labs stable 04/12/21.  ?  ?Hyperlipidemia LDL 42 11/17/20 on pravachol. ? ?Shared Decision Making/Informed Consent   ?   ? ? ?Medication Adjustments/Labs and Tests Ordered: ?Current medicines are reviewed at length with the patient today.  Concerns regarding medicines are outlined  above.  Medication changes, Labs and Tests ordered today are listed in the Patient Instructions below. ?Patient Instructions  ?Medication Instructions:  ? ?START TAKING:  LASIX 20 MG TWICE A DAY  ? ?START TAKING:

## 2021-07-11 ENCOUNTER — Ambulatory Visit: Payer: Medicare Other | Admitting: Physician Assistant

## 2021-07-11 ENCOUNTER — Encounter: Payer: Self-pay | Admitting: Physician Assistant

## 2021-07-11 VITALS — BP 124/58 | HR 72 | Ht 69.51 in | Wt 165.6 lb

## 2021-07-11 DIAGNOSIS — R600 Localized edema: Secondary | ICD-10-CM

## 2021-07-11 DIAGNOSIS — I10 Essential (primary) hypertension: Secondary | ICD-10-CM | POA: Diagnosis not present

## 2021-07-11 DIAGNOSIS — I2510 Atherosclerotic heart disease of native coronary artery without angina pectoris: Secondary | ICD-10-CM | POA: Diagnosis not present

## 2021-07-11 DIAGNOSIS — E785 Hyperlipidemia, unspecified: Secondary | ICD-10-CM

## 2021-07-11 MED ORDER — FUROSEMIDE 20 MG PO TABS
20.0000 mg | ORAL_TABLET | Freq: Two times a day (BID) | ORAL | 1 refills | Status: DC
Start: 2021-07-11 — End: 2021-11-29

## 2021-07-11 MED ORDER — POTASSIUM CHLORIDE CRYS ER 20 MEQ PO TBCR
20.0000 meq | EXTENDED_RELEASE_TABLET | Freq: Two times a day (BID) | ORAL | 1 refills | Status: DC
Start: 2021-07-11 — End: 2021-12-19

## 2021-07-11 NOTE — Patient Instructions (Addendum)
Medication Instructions:  ? ?START TAKING:  LASIX 20 MG TWICE A DAY  ? ?START TAKING:  POTASSIUM 20 MEQ  TWICE A DAY  ? ? ?*If you need a refill on your cardiac medications before your next appointment, please call your pharmacy* ? ? ?Lab Work:  [place] ? ? ?If you have labs (blood work) drawn today and your tests are completely normal, you will receive your results only by: ?MyChart Message (if you have MyChart) OR ?A paper copy in the mail ?If you have any lab test that is abnormal or we need to change your treatment, we will call you to review the results. ? ? ?Testing/Procedures: NONE ORDERED  TODAY ? ? ?Follow-Up: ?At [facility], you and your health needs are our priority.  As part of our continuing mission to provide you with exceptional heart care, we have created designated Provider Care Teams.  These Care Teams include your primary Cardiologist (physician) and Advanced Practice Providers (APPs -  Physician Assistants and Nurse Practitioners) who all work together to provide you with the care you need, when you need it. ? ?We recommend signing up for the patient portal called "MyChart".  Sign up information is provided on this After Visit Summary.  MyChart is used to connect with patients for Virtual Visits (Telemedicine).  Patients are able to view lab/test results, encounter notes, upcoming appointments, etc.  Non-urgent messages can be sent to your provider as well.   ?To learn more about what you can do with MyChart, go to [email or web address].   ? ?Your next appointment:   ?6 month(s) ? ?The format for your next appointment:   ?In Person ? ?Provider:   ?Lauree Chandler, MD   ? ? ?Other Instructions ? ? ?Important Information About Sugar ? ? ? ? ?  ?

## 2021-07-18 ENCOUNTER — Ambulatory Visit (HOSPITAL_COMMUNITY)
Admission: RE | Admit: 2021-07-18 | Discharge: 2021-07-18 | Disposition: A | Discharge status: Home / Self Care | Payer: Medicare Other | Source: Ambulatory Visit | Attending: Hematology | Admitting: Hematology

## 2021-07-18 DIAGNOSIS — C8208 Follicular lymphoma grade I, lymph nodes of multiple sites: Secondary | ICD-10-CM | POA: Diagnosis not present

## 2021-07-18 LAB — GLUCOSE, CAPILLARY: Glucose-Capillary: 95 mg/dL (ref 70–99)

## 2021-07-18 MED ORDER — FLUDEOXYGLUCOSE F - 18 (FDG) INJECTION
7.8000 | Freq: Once | INTRAVENOUS | Status: AC
Start: 2021-07-18 — End: 2021-07-18
  Administered 2021-07-18: 7.8 via INTRAVENOUS

## 2021-08-09 ENCOUNTER — Ambulatory Visit: Payer: Medicare Other | Admitting: Nurse Practitioner

## 2021-08-10 ENCOUNTER — Inpatient Hospital Stay: Payer: Medicare Other

## 2021-08-10 ENCOUNTER — Inpatient Hospital Stay: Payer: Medicare Other | Attending: Hematology | Admitting: Hematology

## 2021-08-10 ENCOUNTER — Other Ambulatory Visit: Payer: Self-pay

## 2021-08-10 VITALS — BP 128/44 | HR 73 | Temp 97.8°F | Resp 17 | Ht 69.51 in | Wt 161.1 lb

## 2021-08-10 DIAGNOSIS — C8208 Follicular lymphoma grade I, lymph nodes of multiple sites: Secondary | ICD-10-CM

## 2021-08-10 DIAGNOSIS — C8201 Follicular lymphoma grade I, lymph nodes of head, face, and neck: Secondary | ICD-10-CM | POA: Insufficient documentation

## 2021-08-10 LAB — CBC WITH DIFFERENTIAL (CANCER CENTER ONLY)
Abs Immature Granulocytes: 0.03 10*3/uL (ref 0.00–0.07)
Basophils Absolute: 0 10*3/uL (ref 0.0–0.1)
Basophils Relative: 1 %
Eosinophils Absolute: 0.1 10*3/uL (ref 0.0–0.5)
Eosinophils Relative: 2 %
HCT: 37.4 % — ABNORMAL LOW (ref 39.0–52.0)
Hemoglobin: 12.4 g/dL — ABNORMAL LOW (ref 13.0–17.0)
Immature Granulocytes: 1 %
Lymphocytes Relative: 25 %
Lymphs Abs: 1.5 10*3/uL (ref 0.7–4.0)
MCH: 28.6 pg (ref 26.0–34.0)
MCHC: 33.2 g/dL (ref 30.0–36.0)
MCV: 86.2 fL (ref 80.0–100.0)
Monocytes Absolute: 0.7 10*3/uL (ref 0.1–1.0)
Monocytes Relative: 12 %
Neutro Abs: 3.6 10*3/uL (ref 1.7–7.7)
Neutrophils Relative %: 59 %
Platelet Count: 226 10*3/uL (ref 150–400)
RBC: 4.34 MIL/uL (ref 4.22–5.81)
RDW: 13.9 % (ref 11.5–15.5)
WBC Count: 6 10*3/uL (ref 4.0–10.5)
nRBC: 0 % (ref 0.0–0.2)

## 2021-08-10 LAB — LACTATE DEHYDROGENASE: LDH: 179 U/L (ref 98–192)

## 2021-08-10 LAB — CMP (CANCER CENTER ONLY)
ALT: 11 U/L (ref 0–44)
AST: 15 U/L (ref 15–41)
Albumin: 4.1 g/dL (ref 3.5–5.0)
Alkaline Phosphatase: 61 U/L (ref 38–126)
Anion gap: 7 (ref 5–15)
BUN: 26 mg/dL — ABNORMAL HIGH (ref 8–23)
CO2: 27 mmol/L (ref 22–32)
Calcium: 9.7 mg/dL (ref 8.9–10.3)
Chloride: 105 mmol/L (ref 98–111)
Creatinine: 1.02 mg/dL (ref 0.61–1.24)
GFR, Estimated: >60 mL/min (ref >60)
Glucose, Bld: 101 mg/dL — ABNORMAL HIGH (ref 70–99)
Potassium: 4.2 mmol/L (ref 3.5–5.1)
Sodium: 139 mmol/L (ref 135–145)
Total Bilirubin: 0.8 mg/dL (ref 0.3–1.2)
Total Protein: 6.7 g/dL (ref 6.5–8.1)

## 2021-08-12 IMAGING — CT CT ABDOMEN AND PELVIS WITH CONTRAST
1 series · 1 of 1 positions shown · IV contrast (omnipaque)
Comparison: 03/20/2018.

CLINICAL DATA: Followup lymphoma.

EXAM:
CT CHEST, ABDOMEN, AND PELVIS WITH CONTRAST
TECHNIQUE: Multidetector CT imaging of the chest, abdomen and pelvis was
performed following the standard protocol during bolus
administration of intravenous contrast.
CONTRAST:  100mL OMNIPAQUE IOHEXOL 300 MG/ML  SOLN

[Series 1: topogram 0.6 t20f · coronal · 2.00mm/px · 1 of 1 slices shown]
[im 1/1]
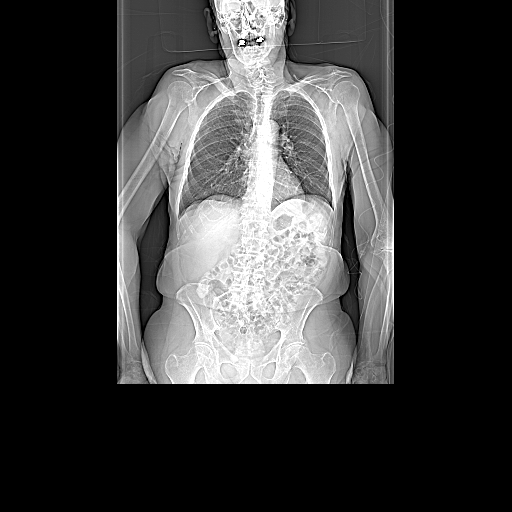

[1 of 1 positions shown; findings below may reference images not displayed]

FINDINGS: CT CHEST FINDINGS

Cardiovascular: Normal heart size. Aortic atherosclerosis.
Calcification in the LAD, left circumflex and RCA coronary arteries
identified.

Mediastinum/Nodes: Normal appearance of the thyroid gland. The
trachea appears patent and is midline. Normal appearance of the
esophagus.

Persistent and progressive axillary adenopathy. Index left axillary
node measures 5.5 x 2.8 cm, image [DATE]. Previously 3.0 x 2.5 cm.
Multiple new left axillary lymph nodes are identified many of which
have a central area of low attenuation compatible with necrosis.
Index node measures 2.6 x 2.0 cm, image [DATE]. New from previous
exam. New bilateral supraclavicular adenopathy. Index left
supraclavicular node measures 1.8 x 1.4 cm, image [DATE]. Progressive
right axillary adenopathy noted including 2.8 x 2.4 cm node, image
[DATE]. Previously this measured 1.3 x 0.8 cm. New right axillary
lymph node measures 5 by 2 cm, image [DATE].

New bilateral anterior posterior lower cervical the adenopathy. Left
posterior cervical node measures 1.7 x 1.1 cm, image [DATE]. Right
posterior cervical node measures 1.5 x 1.3 cm.

Within the posterior mediastinum there are multiple enlarged lymph
nodes which are new from previous exam. Lymph node posterior to the
esophagus has a short axis of 1.4 cm, image 33/2. Lymph node between
the descending aorta and esophagus measures 1.5 x 1.7 cm, image
40/2.

Lungs/Pleura: No pleural effusion identified. Mild chronic diffuse
interstitial reticulation is identified. No airspace consolidation,
atelectasis or pneumothorax.

Musculoskeletal: No chest wall mass or suspicious bone lesions
identified.

CT ABDOMEN PELVIS FINDINGS

Hepatobiliary: No focal liver abnormality is seen. No gallstones,
gallbladder wall thickening, or biliary dilatation.

Pancreas: Diffuse pancreatic calcifications are identified
compatible with chronic pancreatitis. No acute inflammation, main
duct dilatation or mass.

Spleen: Normal in size without focal abnormality.

Adrenals/Urinary Tract: Normal appearance of the adrenal glands. No
kidney mass or hydronephrosis. Stones within the urinary bladder are
identified measuring up to 6 mm. Within the distal left ureter there
is a calcification measuring 6 mm.

Stomach/Bowel: Stomach normal. The small bowel loops have a normal
course and caliber. No pathologic dilatation of the colon.

Vascular/Lymphatic: No aneurysm. Mild aortic atherosclerosis.
Interval progression of retroperitoneal and pelvic adenopathy. Index
left retroperitoneal node measures 1.5 cm, image 82/2. New from
previous exam. Retrocaval lymph node measures 1.7 cm, image 77/2.
Previously 0.5 cm. Right external iliac node measures 5.6 by 3.3 cm,
image 111/2. Previously 3.8 by 2.7 cm. Left external iliac node
measures 2.5 x 1.6 cm, image 110/2. New from previous exam. Right
inguinal node measures 4.4 x 3.0 cm, image 120/2. Previously 3.9 by
2.2 cm.

Reproductive: Prostate is unremarkable.

Other: No abdominal wall hernia or abnormality. No abdominopelvic
ascites.

Musculoskeletal: No acute or significant osseous findings.
IMPRESSION: 1. Progression of bilateral axillary, retroperitoneal, pelvic, and
inguinal adenopathy. New anterior and posterior lower cervical
adenopathy identified within the neck.
2. Persistent distal left ureteral calculus. Small bladder calculi
are unchanged.
3. Aortic Atherosclerosis ([id number]). Coronary artery
calcifications.
4. Sequelae of chronic pancreatitis noted.

## 2021-08-16 ENCOUNTER — Encounter: Payer: Self-pay | Admitting: Hematology

## 2021-08-16 ENCOUNTER — Telehealth: Payer: Self-pay | Admitting: Hematology

## 2021-08-16 NOTE — Progress Notes (Signed)
HEMATOLOGY/ONCOLOGY CLINIC NOTE  Date of Service:.08/10/2021  Patient Care Team: [name], MD as PCP - General (Family Medicine) [name], MD as PCP - Cardiology (Cardiology)  CHIEF COMPLAINTS/PURPOSE OF CONSULTATION:  Follow-up for continued valuation and management of low-grade follicular lymphoma and discussion of PET/CT results   HISTORY OF PRESENTING ILLNESS:  please see previous notes for details on initial presentation.  INTERVAL HISTORY:   Harold [name] is here for continued valuation management of his low-grade follicular lymphoma and for discussion of his PET/CT results. He notes no acute symptoms suggestive of lymphoma progression since his last clinic visit.  No new lumps or bumps no fevers no chills no night sweats no unexpected weight loss. He notes he has been having some leg swelling and his diuretics have been adjusted accordingly which is making him pee a lot.  We discussed that since he has been mainly eating frozen foods he is probably consuming a lot of salt and that might need attention from a cardiology standpoint. No chest pain or shortness of breath. We discussed his PET CT scan done from 07/18/2021 his overall burden of lymphadenopathy has significantly improved.  Still has some right hilar and right inguinal lymphadenopathy which is nonbulky. We discussed given his cardiac issues and based on his treatment goals we had a discussion and patient prefers to hold off on further Rituxan at this time. We we will hold his treatment today and next time as well and see him back in 4 months for repeat evaluation. He notes that he lost his wife earlier this year.  He had dementia and was in a nursing home.   MEDICAL HISTORY:  Past Medical History:  Diagnosis Date   BPH (benign prostatic hypertrophy)    CAD (coronary artery disease) 01/2010   S/P drug eluting stents LAD and PL branch of [facility] Dr. Olevia Perches   DVT (deep venous thrombosis) ([place])     Peroneal vein thrombus February 5701   follicular lymphoma dx'd 06/2017   Hernia    HLD (hyperlipidemia)    HTN (hypertension)     SURGICAL HISTORY: Past Surgical History:  Procedure Laterality Date   BACK SURGERY     CYSTOSCOPY WITH LITHOLAPAXY N/A 05/20/2019   Procedure: CYSTOSCOPY WITH LITHOLAPAXY;  Surgeon: Ardis Hughs, MD;  Location: [facility];  Service: Urology;  Laterality: N/A;   CYSTOSCOPY/URETEROSCOPY/HOLMIUM LASER/STENT PLACEMENT Left 05/20/2019   Procedure: CYSTOSCOPY/URETEROSCOPY/HOLMIUM LASER/STENT PLACEMENT;  Surgeon: Ardis Hughs, MD;  Location: [facility];  Service: Urology;  Laterality: Left;   HERNIA REPAIR  01/08/11   RIH   MASS EXCISION Right 08/05/2017   Procedure: EXCISION RIGHT POSTERIOR  NECK LYMPH NODE;  Surgeon: Rozetta Nunnery, MD;  Location: [facility];  Service: ENT;  Laterality: Right;    SOCIAL HISTORY: Social History   Socioeconomic History   Marital status: Married    Spouse name: Not on file   Number of children: Not on file   Years of education: Not on file   Highest education level: Not on file  Occupational History   Not on file  Tobacco Use   Smoking status: Never   Smokeless tobacco: Never  Vaping Use   Vaping Use: Never used  Substance and Sexual Activity   Alcohol use: No   Drug use: No   Sexual activity: Not on file  Other Topics Concern   Not on file  Social History Narrative   Married   Social Determinants of Health  Financial Resource Strain: Not on file  Food Insecurity: Not on file  Transportation Needs: Not on file  Physical Activity: Not on file  Stress: Not on file  Social Connections: Not on file  Intimate Partner Violence: Not on file    FAMILY HISTORY: Family History  Problem Relation Age of Onset   Heart failure Mother     ALLERGIES:  has No Known Allergies.  MEDICATIONS:  Current Outpatient Medications  Medication Sig Dispense Refill   aspirin EC 81 MG tablet Take 1  tablet (81 mg total) by mouth daily. Swallow whole. 90 tablet 3   atenolol (TENORMIN) 25 MG tablet Take 1 tablet (25 mg total) by mouth daily. 90 tablet 3   brimonidine (ALPHAGAN) 0.2 % ophthalmic solution Place 1 drop into the right eye 2 (two) times daily.     cholecalciferol (VITAMIN D) 1000 UNITS tablet Take 1,000 Units by mouth daily.     Cyanocobalamin (B-12) 2500 MCG TABS Take 2,500 mcg by mouth daily.     finasteride (PROSCAR) 5 MG tablet Take 5 mg by mouth daily.     furosemide (LASIX) 20 MG tablet Take 1 tablet (20 mg total) by mouth 2 (two) times daily. 180 tablet 1   gabapentin (NEURONTIN) 100 MG capsule Take 100 mg by mouth daily as needed (knee pain).      isosorbide dinitrate (ISORDIL) 5 MG tablet Take 1 tablet (5 mg total) by mouth 2 (two) times daily. 180 tablet 3   lisinopril (ZESTRIL) 5 MG tablet Take 5 mg by mouth daily.     nitroGLYCERIN (NITROSTAT) 0.4 MG SL tablet Place 1 tablet (0.4 mg total) under the tongue every 5 (five) minutes as needed for chest pain. 25 tablet 6   potassium chloride SA (KLOR-CON M) 20 MEQ tablet Take 1 tablet (20 mEq total) by mouth 2 (two) times daily. 180 tablet 1   pravastatin (PRAVACHOL) 40 MG tablet Take 1 tablet (40 mg total) by mouth at bedtime. 90 tablet 3   timolol (TIMOPTIC) 0.5 % ophthalmic solution Place 1 drop into the right eye daily. (Patient not taking: Reported on 07/11/2021)     No current facility-administered medications for this visit.    REVIEW OF SYSTEMS:   10 Point review of Systems was done is negative except as noted above.  PHYSICAL EXAMINATION: ECOG FS:2 - Symptomatic, <50% confined to bed  Vitals:   08/10/21 0956  BP: (!) 128/44  Pulse: 73  Resp: 17  Temp: 97.8 F (36.6 C)  SpO2: 98%    Wt Readings from Last 3 Encounters:  08/10/21 161 lb 1.6 oz (73.1 kg)  07/11/21 165 lb 9.6 oz (75.1 kg)  06/11/21 164 lb 9.6 oz (74.7 kg)   Body mass index is 23.44 kg/m.   [name] GENERAL:alert, in no acute distress and  comfortable SKIN: no acute rashes, no significant lesions EYES: conjunctiva are pink and non-injected, sclera anicteric OROPHARYNX: MMM, no exudates, no oropharyngeal erythema or ulceration NECK: supple, no JVD LYMPH:  no palpable lymphadenopathy in the cervical, axillary or inguinal regions LUNGS: clear to auscultation b/l with normal respiratory effort HEART: regular rate & rhythm ABDOMEN:  normoactive bowel sounds , non tender, not distended. Extremity: no pedal edema PSYCH: alert & oriented x 3 with fluent speech NEURO: no focal motor/sensory deficits   LABORATORY DATA:  I have reviewed the data as listed     Latest Ref Rng & Units 08/10/2021    9:27 AM 06/11/2021    9:27 AM  04/12/2021    9:43 AM  CBC  WBC 4.0 - 10.5 K/uL 6.0   9.2   7.2    Hemoglobin 13.0 - 17.0 g/dL 12.4   11.7   12.2    Hematocrit 39.0 - 52.0 % 37.4   36.2   37.7    Platelets 150 - 400 K/uL 226   236   234         Latest Ref Rng & Units 08/10/2021    9:27 AM 06/11/2021    9:27 AM 04/12/2021    9:43 AM  CMP  Glucose 70 - 99 mg/dL 101   95   94    BUN 8 - 23 mg/dL '26   28   27    '$ Creatinine 0.61 - 1.24 mg/dL 1.02   0.92   0.85    Sodium 135 - 145 mmol/L 139   139   140    Potassium 3.5 - 5.1 mmol/L 4.2   4.2   4.1    Chloride 98 - 111 mmol/L 105   105   106    CO2 22 - 32 mmol/L '27   28   28    '$ Calcium 8.9 - 10.3 mg/dL 9.7   9.3   9.4    Total Protein 6.5 - 8.1 g/dL 6.7   6.5   6.5    Total Bilirubin 0.3 - 1.2 mg/dL 0.8   0.7   0.8    Alkaline Phos 38 - 126 U/L 61   60   55    AST 15 - 41 U/L '15   13   14    '$ ALT 0 - 44 U/L '11   9   11     '$ Lab Results  Component Value Date   LDH 179 08/10/2021     02/18/2019 CT Abdomen Pelvis W Contrast (Accession [id number]):   02/18/2019 CT Chest W Contrast (Accession [id number]):  08/08/17 Pathology:   08/05/17 Pathology:     RADIOGRAPHIC STUDIES: I have personally reviewed the radiological images as listed and agreed with the findings in the  report. NM PET Image Restag (PS) Skull Base To Thigh  Result Date: 07/19/2021 CLINICAL DATA:  Subsequent treatment strategy for follicular lymphoma. EXAM: NUCLEAR MEDICINE PET SKULL BASE TO THIGH TECHNIQUE: 7.8 mCi F-18 FDG was injected intravenously. Full-ring PET imaging was performed from the skull base to thigh after the radiotracer. CT data was obtained and used for attenuation correction and anatomic localization. Fasting blood glucose: 95 mg/dl COMPARISON:  CT chest abdomen pelvis 07/31/2020 and PET 09/02/2017. FINDINGS: Mediastinal blood pool activity: SUV max 2.1 Liver activity: SUV max 3.2 NECK: No abnormal hypermetabolism. Incidental CT findings: None. CHEST: Right hilar hypermetabolism, SUV max 6.3. Correlation with CT is challenging without IV contrast. No additional hypermetabolic lymph nodes in the mediastinum, hilar regions or axillary regions. No hypermetabolic pulmonary nodules. Incidental CT findings: Atherosclerotic calcification of the aorta, aortic valve and coronary arteries. Heart size normal. No pericardial or pleural effusion. ABDOMEN/PELVIS: Mild bilateral adrenal hypermetabolism without CT correlates. No abnormal hypermetabolism in the liver, spleen or pancreas. Hypermetabolic right inguinal lymph nodes measure up to 8 mm (4/185), SUV max 7.5. No additional hypermetabolic lymph nodes. Incidental CT findings: Liver, gallbladder, adrenal glands, kidneys and spleen are unremarkable. Calcifications are seen throughout the pancreas. Stomach and bowel are grossly unremarkable. Atherosclerotic calcification of the aorta. Layering stones in the bladder versus posterior wall calcification. SKELETON: No abnormal hypermetabolism. Incidental CT findings: Degenerative changes in the  spine and hips. IMPRESSION: 1. Hypermetabolic right hilar and right inguinal lymph nodes, consistent with active lymphoma. 2. Chronic calcific pancreatitis. 3. Layering stones in the bladder versus posterior wall  calcification. Please correlate clinically as posterior wall calcification can be seen in the setting of urothelial carcinoma. 4. Aortic atherosclerosis (ICD10-I70.0). Coronary artery calcification. Electronically Signed   By: Lorin Picket M.D.   On: 07/19/2021 14:17     ASSESSMENT & PLAN:   87 y.o. male with  1.h/o  atleast Stage III Follicular Lymphoma Grade 1-2 presenting with rt cervical lymphadenopathy  08/05/17 pathology results of right cervical LN revealing low grade follicular lymphoma  0/24/09 PET revealed Hypermetabolic lymphadenopathy involving the right neck, bilateral axillary, right hilar and right inguinal lymph nodes. No abdominal or pelvic involvement.    03/20/18 CT C/A/P revealed Progressive bilateral axillary and right inguinal lymphadenopathy, including necrotic lymph nodes in the right axilla, as above. Spleen is normal in size. 4 mm distal left ureteral calculus, unchanged. No hydronephrosis. Stable layering bladder calculi measuring up to 5 mm. Cholelithiasis, without associated inflammatory changes. Stable sequela of prior/chronic pancreatitis.   11/03/2018 CT neck revealed "Marked interval progression of cervical lymphadenopathy since PET-CT 09/02/2017, now with lymphadenopathy present bilaterally at essentially all stations. Index nodes as described. This includes bilateral intraparotid lymphadenopathy. Bulky right-sided adenopathy results in multifocal narrowing of the right internal jugular vein. Significant narrowing of the mid to lower left internal jugular vein of unclear cause as there is no significant mass effect at this level."  11/03/2018 CT CAP revealed "1. Progression of bilateral axillary, retroperitoneal, pelvic, and inguinal adenopathy. New anterior and posterior lower cervical adenopathy identified within the neck. 2. Persistent distal left ureteral calculus. Small bladder calculi are unchanged. 3. Aortic Atherosclerosis (ICD10-I70.0). Coronary artery  calcifications. 4. Sequelae of chronic pancreatitis noted."  10/01/2019 CT Abd/Pel ([id number]) revealed no evidence of mass in right kidney, new and enlarged abdominal and pelvic lymph nodes.   2. 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 3. H/o head and neck Basal cell carcinoma Continue routine skin checks with dermatologist    4. Renal indeterminate lesion -06/28/19 Renal US ([id number]) revealed New right-sided mass at the hilum which measures 1.5cm in size.   -resolved on subsequent scans   PLAN:  -Patient's lab results from today discussed in detail with him. CBC within normal limits No new toxicities from the patient's current medications. Patient's PET CT scan from 07/18/2021 showed hypermetabolic right hilar and right inguinal lymph nodes consistent with active lymphoma.  Patient has no clinical or lab evidence of lymphoma progression at this point time. Given there is not much bulky disease on the scans patient prefers to hold off on the maintenance Rituxan.  We shall hold off on his exam today and on subsequent visits.   FOLLOW UP:  Return to clinic with Dr. Irene Limbo with labs and 4 months

## 2021-08-16 NOTE — Telephone Encounter (Signed)
Scheduled follow-up appointment per 5/26 los. Patient's daughter is aware. Mailed calendar.

## 2021-10-08 ENCOUNTER — Other Ambulatory Visit: Payer: Self-pay

## 2021-11-29 ENCOUNTER — Other Ambulatory Visit: Payer: Self-pay | Admitting: Physician Assistant

## 2021-12-10 ENCOUNTER — Other Ambulatory Visit: Payer: Self-pay

## 2021-12-10 DIAGNOSIS — C8208 Follicular lymphoma grade I, lymph nodes of multiple sites: Secondary | ICD-10-CM

## 2021-12-11 ENCOUNTER — Inpatient Hospital Stay: Payer: Medicare Other | Attending: Hematology | Admitting: Hematology

## 2021-12-11 ENCOUNTER — Other Ambulatory Visit: Payer: Self-pay

## 2021-12-11 ENCOUNTER — Inpatient Hospital Stay: Payer: Medicare Other

## 2021-12-11 VITALS — BP 128/56 | HR 76 | Temp 97.8°F | Resp 16 | Ht 69.51 in

## 2021-12-11 DIAGNOSIS — C8208 Follicular lymphoma grade I, lymph nodes of multiple sites: Secondary | ICD-10-CM

## 2021-12-11 DIAGNOSIS — C8298 Follicular lymphoma, unspecified, lymph nodes of multiple sites: Secondary | ICD-10-CM | POA: Diagnosis present

## 2021-12-11 LAB — CBC WITH DIFFERENTIAL (CANCER CENTER ONLY)
Abs Immature Granulocytes: 0.03 10*3/uL (ref 0.00–0.07)
Basophils Absolute: 0 10*3/uL (ref 0.0–0.1)
Basophils Relative: 1 %
Eosinophils Absolute: 0.1 10*3/uL (ref 0.0–0.5)
Eosinophils Relative: 1 %
HCT: 37.9 % — ABNORMAL LOW (ref 39.0–52.0)
Hemoglobin: 12.4 g/dL — ABNORMAL LOW (ref 13.0–17.0)
Immature Granulocytes: 1 %
Lymphocytes Relative: 18 %
Lymphs Abs: 1.2 10*3/uL (ref 0.7–4.0)
MCH: 28.9 pg (ref 26.0–34.0)
MCHC: 32.7 g/dL (ref 30.0–36.0)
MCV: 88.3 fL (ref 80.0–100.0)
Monocytes Absolute: 0.5 10*3/uL (ref 0.1–1.0)
Monocytes Relative: 8 %
Neutro Abs: 4.6 10*3/uL (ref 1.7–7.7)
Neutrophils Relative %: 71 %
Platelet Count: 229 10*3/uL (ref 150–400)
RBC: 4.29 MIL/uL (ref 4.22–5.81)
RDW: 14.1 % (ref 11.5–15.5)
WBC Count: 6.5 10*3/uL (ref 4.0–10.5)
nRBC: 0 % (ref 0.0–0.2)

## 2021-12-11 LAB — CMP (CANCER CENTER ONLY)
ALT: 11 U/L (ref 0–44)
AST: 15 U/L (ref 15–41)
Albumin: 4.2 g/dL (ref 3.5–5.0)
Alkaline Phosphatase: 54 U/L (ref 38–126)
Anion gap: 5 (ref 5–15)
BUN: 39 mg/dL — ABNORMAL HIGH (ref 8–23)
CO2: 29 mmol/L (ref 22–32)
Calcium: 9.2 mg/dL (ref 8.9–10.3)
Chloride: 106 mmol/L (ref 98–111)
Creatinine: 1.1 mg/dL (ref 0.61–1.24)
GFR, Estimated: >60 mL/min (ref >60)
Glucose, Bld: 103 mg/dL — ABNORMAL HIGH (ref 70–99)
Potassium: 3.9 mmol/L (ref 3.5–5.1)
Sodium: 140 mmol/L (ref 135–145)
Total Bilirubin: 0.7 mg/dL (ref 0.3–1.2)
Total Protein: 6.9 g/dL (ref 6.5–8.1)

## 2021-12-11 LAB — LACTATE DEHYDROGENASE: LDH: 160 U/L (ref 98–192)

## 2021-12-11 NOTE — Progress Notes (Signed)
HEMATOLOGY/ONCOLOGY CLINIC NOTE  Date of Service: 12/11/2021  Patient Care Team: [name], MD as PCP - General (Family Medicine) [name], MD as PCP - Cardiology (Cardiology)  CHIEF COMPLAINTS/PURPOSE OF CONSULTATION:  Follow-up for continued valuation and management of low-grade follicular lymphoma   HISTORY OF PRESENTING ILLNESS:  please see previous notes for details on initial presentation.  INTERVAL HISTORY:  Harold [name] is here for continued valuation management of his low-grade follicular lymphoma. He reports He is doing well with no new symptoms or concerns.  He reports a minor lump in lower right pelvic region which we have previously discussed. He notes mild discomfort and we discussed that it could be scar tissue from previous hernia repair or a LN.   He notes no acute symptoms suggestive of lymphoma progression since his last clinic visit.  We discussed keeping up with age appropriate vaccinations.  No fever, chills, night sweats. No new lumps, bumps, or lesions/rashes. No abdominal pain or change in bowel habits. No new or unexpected weight loss. No chest pain or SOB. No other new or acute focal symptoms.  Labs done today were reviewed in detail.  MEDICAL HISTORY:  Past Medical History:  Diagnosis Date   BPH (benign prostatic hypertrophy)    CAD (coronary artery disease) 01/2010   S/P drug eluting stents LAD and PL branch of [facility] Dr. Olevia Perches   DVT (deep venous thrombosis) ([place])    Peroneal vein thrombus February 9470   follicular lymphoma dx'd 06/2017   Hernia    HLD (hyperlipidemia)    HTN (hypertension)     SURGICAL HISTORY: Past Surgical History:  Procedure Laterality Date   BACK SURGERY     CYSTOSCOPY WITH LITHOLAPAXY N/A 05/20/2019   Procedure: CYSTOSCOPY WITH LITHOLAPAXY;  Surgeon: Ardis Hughs, MD;  Location: [facility];  Service: Urology;  Laterality: N/A;   CYSTOSCOPY/URETEROSCOPY/HOLMIUM LASER/STENT PLACEMENT Left  05/20/2019   Procedure: CYSTOSCOPY/URETEROSCOPY/HOLMIUM LASER/STENT PLACEMENT;  Surgeon: Ardis Hughs, MD;  Location: [facility];  Service: Urology;  Laterality: Left;   HERNIA REPAIR  01/08/11   RIH   MASS EXCISION Right 08/05/2017   Procedure: EXCISION RIGHT POSTERIOR  NECK LYMPH NODE;  Surgeon: Rozetta Nunnery, MD;  Location: [facility];  Service: ENT;  Laterality: Right;    SOCIAL HISTORY: Social History   Socioeconomic History   Marital status: Married    Spouse name: Not on file   Number of children: Not on file   Years of education: Not on file   Highest education level: Not on file  Occupational History   Not on file  Tobacco Use   Smoking status: Never   Smokeless tobacco: Never  Vaping Use   Vaping Use: Never used  Substance and Sexual Activity   Alcohol use: No   Drug use: No   Sexual activity: Not on file  Other Topics Concern   Not on file  Social History Narrative   Married   Social Determinants of Health   Financial Resource Strain: Not on file  Food Insecurity: Not on file  Transportation Needs: Not on file  Physical Activity: Not on file  Stress: Not on file  Social Connections: Not on file  Intimate Partner Violence: Not on file    FAMILY HISTORY: Family History  Problem Relation Age of Onset   Heart failure Mother     ALLERGIES:  has No Known Allergies.  MEDICATIONS:  Current Outpatient Medications  Medication Sig Dispense Refill  aspirin EC 81 MG tablet Take 1 tablet (81 mg total) by mouth daily. Swallow whole. 90 tablet 3   atenolol (TENORMIN) 25 MG tablet Take 1 tablet (25 mg total) by mouth daily. 90 tablet 3   brimonidine (ALPHAGAN) 0.2 % ophthalmic solution Place 1 drop into the right eye 2 (two) times daily.     cholecalciferol (VITAMIN D) 1000 UNITS tablet Take 1,000 Units by mouth daily.     Cyanocobalamin (B-12) 2500 MCG TABS Take 2,500 mcg by mouth daily.     finasteride (PROSCAR) 5 MG tablet Take 5 mg by  mouth daily.     furosemide (LASIX) 20 MG tablet Take 1 tablet (20 mg total) by mouth 2 (two) times daily. Please keep upcoming appointment for future refills. Thank you. 180 tablet 0   gabapentin (NEURONTIN) 100 MG capsule Take 100 mg by mouth daily as needed (knee pain).      gabapentin (NEURONTIN) 100 MG capsule Take 1 capsule by mouth 2 (two) times daily.     isosorbide dinitrate (ISORDIL) 5 MG tablet Take 1 tablet (5 mg total) by mouth 2 (two) times daily. 180 tablet 3   lisinopril (ZESTRIL) 5 MG tablet Take 5 mg by mouth daily.     nitroGLYCERIN (NITROSTAT) 0.4 MG SL tablet Place 1 tablet (0.4 mg total) under the tongue every 5 (five) minutes as needed for chest pain. 25 tablet 6   ofloxacin (OCUFLOX) 0.3 % ophthalmic solution SMARTSIG:In Eye(s)     potassium chloride SA (KLOR-CON M) 20 MEQ tablet Take 1 tablet (20 mEq total) by mouth 2 (two) times daily. 180 tablet 1   pravastatin (PRAVACHOL) 40 MG tablet Take 1 tablet (40 mg total) by mouth at bedtime. 90 tablet 3   timolol (TIMOPTIC) 0.5 % ophthalmic solution Place 1 drop into the right eye daily. (Patient not taking: Reported on 07/11/2021)     No current facility-administered medications for this visit.    REVIEW OF SYSTEMS:   10 Point review of Systems was done is negative except as noted above.  PHYSICAL EXAMINATION: ECOG FS:2 - Symptomatic, <50% confined to bed  Vitals:   12/11/21 0951  BP: (!) 128/56  Pulse: 76  Resp: 16  Temp: 97.8 F (36.6 C)  SpO2: 99%    Wt Readings from Last 3 Encounters:  08/10/21 161 lb 1.6 oz (73.1 kg)  07/11/21 165 lb 9.6 oz (75.1 kg)  06/11/21 164 lb 9.6 oz (74.7 kg)   Body mass index is 23.44 kg/m.   NAD GENERAL:alert, in no acute distress and comfortable SKIN: no acute rashes, no significant lesions EYES: conjunctiva are pink and non-injected, sclera anicteric NECK: supple, no JVD LYMPH:  1-2 cm lymph node in right groin. LUNGS: clear to auscultation b/l with normal respiratory  effort HEART: regular rate & rhythm ABDOMEN:  normoactive bowel sounds , non tender, not distended. Extremity: no pedal edema PSYCH: alert & oriented x 3 with fluent speech NEURO: no focal motor/sensory deficits  LABORATORY DATA:  I have reviewed the data as listed     Latest Ref Rng & Units 12/11/2021    9:26 AM 08/10/2021    9:27 AM 06/11/2021    9:27 AM  CBC  WBC 4.0 - 10.5 K/uL 6.5  6.0  9.2   Hemoglobin 13.0 - 17.0 g/dL 12.4  12.4  11.7   Hematocrit 39.0 - 52.0 % 37.9  37.4  36.2   Platelets 150 - 400 K/uL 229  226  236  Latest Ref Rng & Units 08/10/2021    9:27 AM 06/11/2021    9:27 AM 04/12/2021    9:43 AM  CMP  Glucose 70 - 99 mg/dL 101  95  94   BUN 8 - 23 mg/dL '26  28  27   '$ Creatinine 0.61 - 1.24 mg/dL 1.02  0.92  0.85   Sodium 135 - 145 mmol/L 139  139  140   Potassium 3.5 - 5.1 mmol/L 4.2  4.2  4.1   Chloride 98 - 111 mmol/L 105  105  106   CO2 22 - 32 mmol/L '27  28  28   '$ Calcium 8.9 - 10.3 mg/dL 9.7  9.3  9.4   Total Protein 6.5 - 8.1 g/dL 6.7  6.5  6.5   Total Bilirubin 0.3 - 1.2 mg/dL 0.8  0.7  0.8   Alkaline Phos 38 - 126 U/L 61  60  55   AST 15 - 41 U/L '15  13  14   '$ ALT 0 - 44 U/L '11  9  11    '$ Lab Results  Component Value Date   LDH 179 08/10/2021     02/18/2019 CT Abdomen Pelvis W Contrast (Accession [id number]):   02/18/2019 CT Chest W Contrast (Accession [id number]):  08/08/17 Pathology:   08/05/17 Pathology:     RADIOGRAPHIC STUDIES: I have personally reviewed the radiological images as listed and agreed with the findings in the report. No results found.   ASSESSMENT & PLAN:   87 y.o. male with  1.h/o  atleast Stage III Follicular Lymphoma Grade 1-2 presenting with rt cervical lymphadenopathy  08/05/17 pathology results of right cervical LN revealing low grade follicular lymphoma  07/05/35 PET revealed Hypermetabolic lymphadenopathy involving the right neck, bilateral axillary, right hilar and right inguinal lymph nodes. No  abdominal or pelvic involvement.    03/20/18 CT C/A/P revealed Progressive bilateral axillary and right inguinal lymphadenopathy, including necrotic lymph nodes in the right axilla, as above. Spleen is normal in size. 4 mm distal left ureteral calculus, unchanged. No hydronephrosis. Stable layering bladder calculi measuring up to 5 mm. Cholelithiasis, without associated inflammatory changes. Stable sequela of prior/chronic pancreatitis.   11/03/2018 CT neck revealed "Marked interval progression of cervical lymphadenopathy since PET-CT 09/02/2017, now with lymphadenopathy present bilaterally at essentially all stations. Index nodes as described. This includes bilateral intraparotid lymphadenopathy. Bulky right-sided adenopathy results in multifocal narrowing of the right internal jugular vein. Significant narrowing of the mid to lower left internal jugular vein of unclear cause as there is no significant mass effect at this level."  11/03/2018 CT CAP revealed "1. Progression of bilateral axillary, retroperitoneal, pelvic, and inguinal adenopathy. New anterior and posterior lower cervical adenopathy identified within the neck. 2. Persistent distal left ureteral calculus. Small bladder calculi are unchanged. 3. Aortic Atherosclerosis (ICD10-I70.0). Coronary artery calcifications. 4. Sequelae of chronic pancreatitis noted."  10/01/2019 CT Abd/Pel ([id number]) revealed no evidence of mass in right kidney, new and enlarged abdominal and pelvic lymph nodes.   2. 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 3. H/o head and neck Basal cell carcinoma Continue routine skin checks with dermatologist    4. Renal indeterminate lesion -06/28/19 Renal US ([id number]) revealed New right-sided mass at the hilum which measures 1.5cm in size.   -resolved on subsequent scans   PLAN: -Patient's lab results from today discussed in detail with him. CBC within normal limits No new toxicities from the patient's current  medications. Patient's PET CT scan from 07/18/2021 showed hypermetabolic  right hilar and right inguinal lymph nodes consistent with active lymphoma.  Patient has no clinical or lab evidence of lymphoma progression at this point time. Given there is not much bulky disease on the scans patient prefers to hold off on the maintenance Rituxan. -We discussed keeping up with age appropriate vaccinations.   FOLLOW UP:  RTC with Dr Irene Limbo with labs in 6 months F/u with dermatology as per recommendations  Sullivan Lone MD MS AAHIVMS [facility] Hematology/Oncology Physician [place]  (Office):       [phone] (Work cell):  [phone] (Fax):           [phone]  I, Melene Muller, am acting as scribe for Dr. Sullivan Lone, MD.  .I have reviewed the above documentation for accuracy and completeness, and I agree with the above. Brunetta Genera MD

## 2021-12-17 ENCOUNTER — Encounter: Payer: Self-pay | Admitting: Hematology

## 2021-12-19 ENCOUNTER — Other Ambulatory Visit: Payer: Self-pay | Admitting: Physician Assistant

## 2021-12-25 NOTE — Progress Notes (Unsigned)
Chief Complaint  Patient presents with   Follow-up    CAD   History of Present Illness: 87 yo male with history of CAD, HTN, follicular lymphoma and HLD here today for cardiac follow up. He underwent catheterization in October 2011 because of chest pain and was found to have a severe stenosis in a large posterolateral branch of the right coronary artery and a severe stenosis in the proximal LAD with complete occlusion of the distal LAD. Drug eluting stents were placed in the posterior lateral branch of the right coronary and the mid LAD in October 2011. He was diagnosed with lymphoma in March 2019. This was found when he had a mass on his neck. This was treated with resection. He completed chemotherapy. Echo January 2020 with LVEF=60-65%. No significant valve disease. He was seen in my office February 2022 and c/o bilateral leg swelling with left worse than the right. Venous dopplers February 2022 with peroneal vein thrombosis. He was started on Eliquis and completed six months of therapy. He was admitted to [facility] in February 2023 with chest pain, mild troponin elevation. Outpatient stress test 04/27/21 with no large areas of ischemia. He was seen in our office at the end of February 2023 and was doing well.   He is here today for follow up. The patient denies any chest pain, dyspnea, palpitations, lower extremity edema, orthopnea, PND, dizziness, near syncope or syncope.     Primary Care Physician: [name], MD  Past Medical History:  Diagnosis Date   BPH (benign prostatic hypertrophy)    CAD (coronary artery disease) 01/2010   S/P drug eluting stents LAD and PL branch of RCA Dr. Olevia Perches   DVT (deep venous thrombosis) ([place])    Peroneal vein thrombus February 9767   follicular lymphoma dx'd 06/2017   Hernia    HLD (hyperlipidemia)    HTN (hypertension)     Past Surgical History:  Procedure Laterality Date   BACK SURGERY     CYSTOSCOPY WITH LITHOLAPAXY N/A 05/20/2019    Procedure: CYSTOSCOPY WITH LITHOLAPAXY;  Surgeon: Ardis Hughs, MD;  Location: [facility];  Service: Urology;  Laterality: N/A;   CYSTOSCOPY/URETEROSCOPY/HOLMIUM LASER/STENT PLACEMENT Left 05/20/2019   Procedure: CYSTOSCOPY/URETEROSCOPY/HOLMIUM LASER/STENT PLACEMENT;  Surgeon: Ardis Hughs, MD;  Location: [facility];  Service: Urology;  Laterality: Left;   HERNIA REPAIR  01/08/11   RIH   MASS EXCISION Right 08/05/2017   Procedure: EXCISION RIGHT POSTERIOR  NECK LYMPH NODE;  Surgeon: Rozetta Nunnery, MD;  Location: [facility];  Service: ENT;  Laterality: Right;    Current Outpatient Medications  Medication Sig Dispense Refill   aspirin EC 81 MG tablet Take 1 tablet (81 mg total) by mouth daily. Swallow whole. 90 tablet 3   atenolol (TENORMIN) 25 MG tablet Take 1 tablet (25 mg total) by mouth daily. 90 tablet 3   brimonidine (ALPHAGAN) 0.2 % ophthalmic solution Place 1 drop into the right eye 2 (two) times daily.     cholecalciferol (VITAMIN D) 1000 UNITS tablet Take 1,000 Units by mouth daily.     Cyanocobalamin (B-12) 2500 MCG TABS Take 2,500 mcg by mouth daily.     finasteride (PROSCAR) 5 MG tablet Take 5 mg by mouth daily.     furosemide (LASIX) 20 MG tablet Take 1 tablet (20 mg total) by mouth 2 (two) times daily. Please keep upcoming appointment for future refills. Thank you. 180 tablet 0   gabapentin (NEURONTIN) 100 MG capsule Take  1 capsule by mouth 2 (two) times daily.     isosorbide dinitrate (ISORDIL) 5 MG tablet Take 1 tablet (5 mg total) by mouth 2 (two) times daily. 180 tablet 3   lisinopril (ZESTRIL) 5 MG tablet Take 5 mg by mouth daily.     ofloxacin (OCUFLOX) 0.3 % ophthalmic solution SMARTSIG:In Eye(s)     potassium chloride SA (KLOR-CON M) 20 MEQ tablet Take 1 tablet (20 mEq total) by mouth 2 (two) times daily. Please keep upcoming appointment for future refills. Thank you. 180 tablet 0   gabapentin (NEURONTIN) 100 MG capsule Take 100 mg by mouth daily  as needed (knee pain).      nitroGLYCERIN (NITROSTAT) 0.4 MG SL tablet Place 1 tablet (0.4 mg total) under the tongue every 5 (five) minutes as needed for chest pain. 25 tablet 6   pravastatin (PRAVACHOL) 40 MG tablet Take 1 tablet (40 mg total) by mouth at bedtime. 90 tablet 3   timolol (TIMOPTIC) 0.5 % ophthalmic solution Place 1 drop into the right eye daily. (Patient not taking: Reported on 07/11/2021)     No current facility-administered medications for this visit.    No Known Allergies  Social History   Socioeconomic History   Marital status: Married    Spouse name: Not on file   Number of children: Not on file   Years of education: Not on file   Highest education level: Not on file  Occupational History   Not on file  Tobacco Use   Smoking status: Never   Smokeless tobacco: Never  Vaping Use   Vaping Use: Never used  Substance and Sexual Activity   Alcohol use: No   Drug use: No   Sexual activity: Not on file  Other Topics Concern   Not on file  Social History Narrative   Married   Social Determinants of Health   Financial Resource Strain: Not on file  Food Insecurity: Not on file  Transportation Needs: Not on file  Physical Activity: Not on file  Stress: Not on file  Social Connections: Not on file  Intimate Partner Violence: Not on file    Family History  Problem Relation Age of Onset   Heart failure Mother     Review of Systems:  As stated in the HPI and otherwise negative.   BP 120/66   Pulse 78   Ht 5' 9.51" (1.766 m)   Wt 162 lb 12.8 oz (73.8 kg)   SpO2 99%   BMI 23.69 kg/m   Physical Examination: General: Well developed, well nourished, NAD  HEENT: OP clear, mucus membranes moist  SKIN: warm, dry. No rashes. Neuro: No focal deficits  Musculoskeletal: Muscle strength 5/5 all ext  Psychiatric: Mood and affect normal  Neck: No JVD, no carotid bruits, no thyromegaly, no lymphadenopathy.  Lungs:Clear bilaterally, no wheezes, rhonci,  crackles Cardiovascular: Regular rate and rhythm. No murmurs, gallops or rubs. Abdomen:Soft. Bowel sounds present. Non-tender.  Extremities: No lower extremity edema. Pulses are 2 + in the bilateral DP/PT.  EKG:  EKG is not ordered today. The ekg ordered today demonstrates   Echo January 2020:  1. The left ventricle has normal systolic function of 10-27%. The cavity  size is normal. There is no left ventricular wall thickness. Echo evidence  of impaired relaxation diastolic filling patterns.   2. Mildly dilated left atrial size.   3. Normal right atrial size.   4. The mitral valve is degenerative. There is moderate thickening and  moderately calcified.  Regurgitation is not visualized by color flow  Doppler.   5. Normal tricuspid valve.   6. The aortic valve tricuspid. There is moderate thickening and moderate  calcification of the aortic valve.   7. No atrial level shunt detected by color flow Doppler.   Recent Labs: 12/11/2021: ALT 11; BUN 39; Creatinine 1.10; Hemoglobin 12.4; Platelet Count 229; Potassium 3.9; Sodium 140   Lipid Panel    Component Value Date/Time   CHOL 130 11/17/2020 0955   TRIG 62 11/17/2020 0955   HDL 75 11/17/2020 0955   CHOLHDL 1.7 11/17/2020 0955   LDLCALC 42 11/17/2020 0955     Wt Readings from Last 3 Encounters:  12/26/21 162 lb 12.8 oz (73.8 kg)  08/10/21 161 lb 1.6 oz (73.1 kg)  07/11/21 165 lb 9.6 oz (75.1 kg)    Assessment and Plan:   1. CAD without angina: No chest pain. Stress test in February 2023 was low risk with no large areas of ischemia. Echo January 2020 with normal LV systolic function. Continue ASA, statin, beta blocker and Imdur. Of note, he was a non-responder to Plavix.   2. HTN: BP controlled. No changes  3. Hyperlipidemia: LDL 42 in September 2022.  Continue statin. Repeat lipids and LFTs now.   4. Chronic diastolic CHF: Weight is stable. No volume overload on exam. Continue Lasix.   Labs/ tests ordered today include:    Orders Placed This Encounter  Procedures   Lipid Profile   Hepatic function panel   Disposition:   F/U with me in 12 months.   Signed, Lauree Chandler, MD 12/26/2021 10:28 AM    [facility] [address], [place], [place]  [address] Phone: [phone]; Fax: [phone]

## 2021-12-26 ENCOUNTER — Encounter: Payer: Self-pay | Admitting: Cardiovascular Disease

## 2021-12-26 ENCOUNTER — Ambulatory Visit: Payer: Medicare Other | Attending: Cardiovascular Disease | Admitting: Cardiovascular Disease

## 2021-12-26 VITALS — BP 120/66 | HR 78 | Ht 69.51 in | Wt 162.8 lb

## 2021-12-26 DIAGNOSIS — I5032 Chronic diastolic (congestive) heart failure: Secondary | ICD-10-CM | POA: Diagnosis not present

## 2021-12-26 DIAGNOSIS — I10 Essential (primary) hypertension: Secondary | ICD-10-CM

## 2021-12-26 DIAGNOSIS — E785 Hyperlipidemia, unspecified: Secondary | ICD-10-CM | POA: Diagnosis not present

## 2021-12-26 DIAGNOSIS — I2510 Atherosclerotic heart disease of native coronary artery without angina pectoris: Secondary | ICD-10-CM | POA: Diagnosis not present

## 2021-12-26 LAB — HEPATIC FUNCTION PANEL
ALT: 12 IU/L (ref 0–44)
AST: 17 IU/L (ref 0–40)
Albumin: 4.4 g/dL (ref 3.7–4.7)
Alkaline Phosphatase: 65 IU/L (ref 44–121)
Bilirubin Total: 0.6 mg/dL (ref 0.0–1.2)
Bilirubin, Direct: 0.18 mg/dL (ref 0.00–0.40)
Total Protein: 6.4 g/dL (ref 6.0–8.5)

## 2021-12-26 LAB — LIPID PANEL
Chol/HDL Ratio: 1.7 ratio (ref 0.0–5.0)
Cholesterol, Total: 129 mg/dL (ref 100–199)
HDL: 78 mg/dL (ref >39)
LDL Chol Calc (NIH): 39 mg/dL (ref 0–99)
Triglycerides: 52 mg/dL (ref 0–149)
VLDL Cholesterol Cal: 12 mg/dL (ref 5–40)

## 2021-12-26 MED ORDER — PRAVASTATIN SODIUM 40 MG PO TABS: 40.0000 mg | ORAL_TABLET | Freq: Every day | ORAL | 3 refills | Status: DC

## 2021-12-26 NOTE — Patient Instructions (Signed)
Medication Instructions:  No changes *If you need a refill on your cardiac medications before your next appointment, please call your pharmacy*   Lab Work: Today: lipids/liver function  If you have labs (blood work) drawn today and your tests are completely normal, you will receive your results only by: [place] (if you have MyChart) OR A paper copy in the mail If you have any lab test that is abnormal or we need to change your treatment, we will call you to review the results.   Testing/Procedures: none   Follow-Up:  Your next appointment:   12 month(s)  The format for your next appointment:   In Person  Provider:   Lauree Chandler, MD      Important Information About Sugar

## 2021-12-27 ENCOUNTER — Other Ambulatory Visit: Payer: Self-pay

## 2022-01-01 ENCOUNTER — Encounter: Payer: Self-pay | Admitting: *Deleted

## 2022-02-19 ENCOUNTER — Other Ambulatory Visit: Payer: Self-pay | Admitting: Physician Assistant

## 2022-02-20 ENCOUNTER — Other Ambulatory Visit: Payer: Self-pay

## 2022-02-22 ENCOUNTER — Other Ambulatory Visit: Payer: Self-pay

## 2022-05-24 ENCOUNTER — Other Ambulatory Visit: Payer: Self-pay

## 2022-05-24 DIAGNOSIS — C8208 Follicular lymphoma grade I, lymph nodes of multiple sites: Secondary | ICD-10-CM

## 2022-05-27 ENCOUNTER — Inpatient Hospital Stay: Payer: Medicare Other | Attending: Nurse Practitioner

## 2022-05-27 ENCOUNTER — Other Ambulatory Visit: Payer: Self-pay

## 2022-05-27 ENCOUNTER — Inpatient Hospital Stay: Payer: Medicare Other | Admitting: Hematology

## 2022-05-27 VITALS — BP 117/52 | HR 71 | Temp 97.0°F | Resp 18 | Wt 163.4 lb

## 2022-05-27 DIAGNOSIS — Z8572 Personal history of non-Hodgkin lymphomas: Secondary | ICD-10-CM | POA: Diagnosis present

## 2022-05-27 DIAGNOSIS — C8208 Follicular lymphoma grade I, lymph nodes of multiple sites: Secondary | ICD-10-CM | POA: Diagnosis not present

## 2022-05-27 LAB — CBC WITH DIFFERENTIAL (CANCER CENTER ONLY)
Abs Immature Granulocytes: 0.04 10*3/uL (ref 0.00–0.07)
Basophils Absolute: 0 10*3/uL (ref 0.0–0.1)
Basophils Relative: 0 %
Eosinophils Absolute: 0.1 10*3/uL (ref 0.0–0.5)
Eosinophils Relative: 1 %
HCT: 37.6 % — ABNORMAL LOW (ref 39.0–52.0)
Hemoglobin: 12.4 g/dL — ABNORMAL LOW (ref 13.0–17.0)
Immature Granulocytes: 1 %
Lymphocytes Relative: 18 %
Lymphs Abs: 1.4 10*3/uL (ref 0.7–4.0)
MCH: 28.9 pg (ref 26.0–34.0)
MCHC: 33 g/dL (ref 30.0–36.0)
MCV: 87.6 fL (ref 80.0–100.0)
Monocytes Absolute: 0.6 10*3/uL (ref 0.1–1.0)
Monocytes Relative: 8 %
Neutro Abs: 5.8 10*3/uL (ref 1.7–7.7)
Neutrophils Relative %: 72 %
Platelet Count: 241 10*3/uL (ref 150–400)
RBC: 4.29 MIL/uL (ref 4.22–5.81)
RDW: 13 % (ref 11.5–15.5)
WBC Count: 8 10*3/uL (ref 4.0–10.5)
nRBC: 0 % (ref 0.0–0.2)

## 2022-05-27 LAB — LACTATE DEHYDROGENASE: LDH: 148 U/L (ref 98–192)

## 2022-05-27 LAB — CMP (CANCER CENTER ONLY)
ALT: 9 U/L (ref 0–44)
AST: 15 U/L (ref 15–41)
Albumin: 4.4 g/dL (ref 3.5–5.0)
Alkaline Phosphatase: 68 U/L (ref 38–126)
Anion gap: 9 (ref 5–15)
BUN: 36 mg/dL — ABNORMAL HIGH (ref 8–23)
CO2: 30 mmol/L (ref 22–32)
Calcium: 9.8 mg/dL (ref 8.9–10.3)
Chloride: 101 mmol/L (ref 98–111)
Creatinine: 1.31 mg/dL — ABNORMAL HIGH (ref 0.61–1.24)
GFR, Estimated: 53 mL/min — ABNORMAL LOW (ref >60)
Glucose, Bld: 90 mg/dL (ref 70–99)
Potassium: 4.1 mmol/L (ref 3.5–5.1)
Sodium: 140 mmol/L (ref 135–145)
Total Bilirubin: 0.8 mg/dL (ref 0.3–1.2)
Total Protein: 7.1 g/dL (ref 6.5–8.1)

## 2022-05-27 NOTE — Progress Notes (Signed)
HEMATOLOGY/ONCOLOGY CLINIC NOTE  Date of Service: 05/27/2022  Patient Care Team: [name], MD as PCP - General (Family Medicine) [name], MD as PCP - Cardiology (Cardiology)  CHIEF COMPLAINTS/PURPOSE OF CONSULTATION:  Follow-up for continued valuation and management of low-grade follicular lymphoma   HISTORY OF PRESENTING ILLNESS:  please see previous notes for details on initial presentation.  INTERVAL HISTORY:  Harold [name] is here for continued valuation management of his low-grade follicular lymphoma.  Patient was last seen by me on 12/11/2021 and he complained of minor lump in lower right pelvic region which was not changed since last visit and mild discomfort.  Patient reports he has been doing well overall without any new medical concerns since our last visit. He reports he recently had dental procedure since our last visit. Patient notes that he had mild jaw pain and mild lump near his jaw area after his dental procedure, which lasted for 1-2 days after his procedure.   Patient reports he is not going to have a PCP after the first week of April due to insurance issues. He is trying to find a new PCP in [place].   Patient notes he has not been drinking water as much as he needs to. He has been drinking diet mountain dew. He reports he has been eating well overall.   He has been wearing compression socks, which has been helping him reduce leg swelling.   He denies fever, chills, night sweats, new lumps/bumps, unexpected weight loss, abdominal pain, chest pain, back pain, or leg swelling. He does complains that he stays cold even at home. He also complains of continuous mild testicular discomfort and he has been following up with his PCP.  Patient notes he went to the ED in [facility] around 3-4 months due to an episode of chest pain. He was admitted for around 4 days. Patient notes that the physician reported that he had an acute heart attack.  Patient has been regularly following up with his Cardiologist after his visit at ED. He hd a stress test at his Cardiologist office, which was normal.   Patient notes that he has been started on Lasix 20 mg since our last visit. He has been tolerating his Lasix dosage well without any toxicities.    MEDICAL HISTORY:  Past Medical History:  Diagnosis Date   BPH (benign prostatic hypertrophy)    CAD (coronary artery disease) 01/2010   S/P drug eluting stents LAD and PL branch of RCA Dr. Olevia Perches   DVT (deep venous thrombosis) ([place])    Peroneal vein thrombus February 123456   follicular lymphoma dx'd 06/2017   Hernia    HLD (hyperlipidemia)    HTN (hypertension)     SURGICAL HISTORY: Past Surgical History:  Procedure Laterality Date   BACK SURGERY     CYSTOSCOPY WITH LITHOLAPAXY N/A 05/20/2019   Procedure: CYSTOSCOPY WITH LITHOLAPAXY;  Surgeon: Ardis Hughs, MD;  [facility];  Service: Urology;  Laterality: N/A;   CYSTOSCOPY/URETEROSCOPY/HOLMIUM LASER/STENT PLACEMENT Left 05/20/2019   Procedure: CYSTOSCOPY/URETEROSCOPY/HOLMIUM LASER/STENT PLACEMENT;  Surgeon: Ardis Hughs, MD;  [facility];  Service: Urology;  Laterality: Left;   HERNIA REPAIR  01/08/11   RIH   MASS EXCISION Right 08/05/2017   Procedure: EXCISION RIGHT POSTERIOR  NECK LYMPH NODE;  Surgeon: Rozetta Nunnery, MD;  Location: [facility];  Service: ENT;  Laterality: Right;    SOCIAL HISTORY: Social History   Socioeconomic History   Marital  status: Married    Spouse name: Not on file   Number of children: Not on file   Years of education: Not on file   Highest education level: Not on file  Occupational History   Not on file  Tobacco Use   Smoking status: Never   Smokeless tobacco: Never  Vaping Use   Vaping Use: Never used  Substance and Sexual Activity   Alcohol use: No   Drug use: No   Sexual activity: Not on file  Other Topics Concern   Not on file  Social History  Narrative   Married   Social Determinants of Health   Financial Resource Strain: Not on file  Food Insecurity: Not on file  Transportation Needs: Not on file  Physical Activity: Not on file  Stress: Not on file  Social Connections: Not on file  Intimate Partner Violence: Not on file    FAMILY HISTORY: Family History  Problem Relation Age of Onset   Heart failure Mother     ALLERGIES:  has No Known Allergies.  MEDICATIONS:  Current Outpatient Medications  Medication Sig Dispense Refill   aspirin EC 81 MG tablet Take 1 tablet (81 mg total) by mouth daily. Swallow whole. 90 tablet 3   atenolol (TENORMIN) 25 MG tablet Take 1 tablet (25 mg total) by mouth daily. 90 tablet 3   brimonidine (ALPHAGAN) 0.2 % ophthalmic solution Place 1 drop into the right eye 2 (two) times daily.     cholecalciferol (VITAMIN D) 1000 UNITS tablet Take 1,000 Units by mouth daily.     Cyanocobalamin (B-12) 2500 MCG TABS Take 2,500 mcg by mouth daily.     finasteride (PROSCAR) 5 MG tablet Take 5 mg by mouth daily.     furosemide (LASIX) 20 MG tablet TAKE 1 TABLET BY MOUTH TWICE  DAILY 180 tablet 3   gabapentin (NEURONTIN) 100 MG capsule Take 100 mg by mouth daily as needed (knee pain).      gabapentin (NEURONTIN) 100 MG capsule Take 1 capsule by mouth 2 (two) times daily.     isosorbide dinitrate (ISORDIL) 5 MG tablet Take 1 tablet (5 mg total) by mouth 2 (two) times daily. 180 tablet 3   lisinopril (ZESTRIL) 5 MG tablet Take 5 mg by mouth daily.     nitroGLYCERIN (NITROSTAT) 0.4 MG SL tablet Place 1 tablet (0.4 mg total) under the tongue every 5 (five) minutes as needed for chest pain. 25 tablet 6   ofloxacin (OCUFLOX) 0.3 % ophthalmic solution SMARTSIG:In Eye(s)     potassium chloride SA (KLOR-CON M) 20 MEQ tablet Take 1 tablet (20 mEq total) by mouth 2 (two) times daily. Please keep upcoming appointment for future refills. Thank you. 180 tablet 0   pravastatin (PRAVACHOL) 40 MG tablet Take 1 tablet (40  mg total) by mouth at bedtime. 90 tablet 3   timolol (TIMOPTIC) 0.5 % ophthalmic solution Place 1 drop into the right eye daily. (Patient not taking: Reported on 07/11/2021)     No current facility-administered medications for this visit.    REVIEW OF SYSTEMS:   10 Point review of Systems was done is negative except as noted above.  PHYSICAL EXAMINATION: ECOG FS:2 - Symptomatic, <50% confined to bed  Vitals:   05/27/22 1140  BP: (!) 117/52  Pulse: 71  Resp: 18  Temp: (!) 97 F (36.1 C)  SpO2: 100%     Wt Readings from Last 3 Encounters:  12/26/21 162 lb 12.8 oz (73.8 kg)  08/10/21  161 lb 1.6 oz (73.1 kg)  07/11/21 165 lb 9.6 oz (75.1 kg)   Body mass index is 23.78 kg/m.   NAD GENERAL:alert, in no acute distress and comfortable SKIN: no acute rashes, no significant lesions EYES: conjunctiva are pink and non-injected, sclera anicteric NECK: supple, no JVD LYMPH:  1-2 cm lymph node in right groin. LUNGS: clear to auscultation b/l with normal respiratory effort HEART: regular rate & rhythm ABDOMEN:  normoactive bowel sounds , non tender, not distended. Extremity: no pedal edema PSYCH: alert & oriented x 3 with fluent speech NEURO: no focal motor/sensory deficits  LABORATORY DATA:  I have reviewed the data as listed     Latest Ref Rng & Units 05/27/2022   11:09 AM 12/11/2021    9:26 AM 08/10/2021    9:27 AM  CBC  WBC 4.0 - 10.5 K/uL 8.0  6.5  6.0   Hemoglobin 13.0 - 17.0 g/dL 12.4  12.4  12.4   Hematocrit 39.0 - 52.0 % 37.6  37.9  37.4   Platelets 150 - 400 K/uL 241  229  226        Latest Ref Rng & Units 05/27/2022   11:09 AM 12/26/2021    9:29 AM 12/11/2021    9:26 AM  CMP  Glucose 70 - 99 mg/dL 90   103   BUN 8 - 23 mg/dL 36   39   Creatinine 0.61 - 1.24 mg/dL 1.31   1.10   Sodium 135 - 145 mmol/L 140   140   Potassium 3.5 - 5.1 mmol/L 4.1   3.9   Chloride 98 - 111 mmol/L 101   106   CO2 22 - 32 mmol/L 30   29   Calcium 8.9 - 10.3 mg/dL 9.8   9.2    Total Protein 6.5 - 8.1 g/dL 7.1  6.4  6.9   Total Bilirubin 0.3 - 1.2 mg/dL 0.8  0.6  0.7   Alkaline Phos 38 - 126 U/L 68  65  54   AST 15 - 41 U/L 15  17  15    ALT 0 - 44 U/L 9  12  11     Lab Results  Component Value Date   LDH 148 05/27/2022     02/18/2019 CT Abdomen Pelvis W Contrast (Accession [id number]):   02/18/2019 CT Chest W Contrast (Accession [id number]):  08/08/17 Pathology:   08/05/17 Pathology:     RADIOGRAPHIC STUDIES: I have personally reviewed the radiological images as listed and agreed with the findings in the report. No results found.   ASSESSMENT & PLAN:   88 y.o. male with  1.h/o  atleast Stage III Follicular Lymphoma Grade 1-2 presenting with rt cervical lymphadenopathy  08/05/17 pathology results of right cervical LN revealing low grade follicular lymphoma  [id number] PET revealed Hypermetabolic lymphadenopathy involving the right neck, bilateral axillary, right hilar and right inguinal lymph nodes. No abdominal or pelvic involvement.    03/20/18 CT C/A/P revealed Progressive bilateral axillary and right inguinal lymphadenopathy, including necrotic lymph nodes in the right axilla, as above. Spleen is normal in size. 4 mm distal left ureteral calculus, unchanged. No hydronephrosis. Stable layering bladder calculi measuring up to 5 mm. Cholelithiasis, without associated inflammatory changes. Stable sequela of prior/chronic pancreatitis.   11/03/2018 CT neck revealed "Marked interval progression of cervical lymphadenopathy since PET-CT 09/02/2017, now with lymphadenopathy present bilaterally at essentially all stations. Index nodes as described. This includes bilateral intraparotid lymphadenopathy. Bulky right-sided adenopathy results in multifocal narrowing of the  right internal jugular vein. Significant narrowing of the mid to lower left internal jugular vein of unclear cause as there is no significant mass effect at this level."  11/03/2018 CT CAP revealed  "1. Progression of bilateral axillary, retroperitoneal, pelvic, and inguinal adenopathy. New anterior and posterior lower cervical adenopathy identified within the neck. 2. Persistent distal left ureteral calculus. Small bladder calculi are unchanged. 3. Aortic Atherosclerosis (ICD10-I70.0). Coronary artery calcifications. 4. Sequelae of chronic pancreatitis noted."  10/01/2019 CT Abd/Pel ([id number]) revealed no evidence of mass in right kidney, new and enlarged abdominal and pelvic lymph nodes.   2. 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 3. H/o head and neck Basal cell carcinoma Continue routine skin checks with dermatologist    4. Renal indeterminate lesion -06/28/19 Renal US [id number]) revealed New right-sided mass at the hilum which measures 1.5cm in size.   -resolved on subsequent scans   PLAN: -Discussed lab results from today, 05/27/2022, with the patient. CBC shows slightly decreased hemoglobin on 12.4 and -Patient has no clinical or lab evidence of lymphoma progression at this time. - no indication for additional treatment of the patients lymphoma at this time. -Answered all of patient's questions.  -Recommended influenza vaccine, COVID-19 Booster, RSV vaccine, and other age appropriate vaccine.  FOLLOW-UP: RTC with Dr Irene Limbo with labs in 6 months  The total time spent in the appointment was 20 minutes* .  All of the patient's questions were answered with apparent satisfaction. The patient knows to call the clinic with any problems, questions or concerns.   Sullivan Lone MD MS AAHIVMS [facility] Hematology/Oncology Physician [facility]  .*Total Encounter Time as defined by the Centers for Medicare and Medicaid Services includes, in addition to the face-to-face time of a patient visit (documented in the note above) non-face-to-face time: obtaining and reviewing outside history, ordering and reviewing medications, tests or procedures, care coordination (communications with  other health care professionals or caregivers) and documentation in the medical record.   I, Cleda Mccreedy, am acting as a Education administrator for Sullivan Lone, MD.  .I have reviewed the above documentation for accuracy and completeness, and I agree with the above. Brunetta Genera MD

## 2022-05-28 ENCOUNTER — Telehealth: Payer: Self-pay | Admitting: Hematology

## 2022-05-28 NOTE — Telephone Encounter (Signed)
Called patient and spoke with patient's daughter per 3/11 los notes to schedule f/u. Patient scheduled and will be notified.

## 2022-05-29 DIAGNOSIS — I5032 Chronic diastolic (congestive) heart failure: Secondary | ICD-10-CM | POA: Diagnosis present

## 2022-05-29 DIAGNOSIS — I5033 Acute on chronic diastolic (congestive) heart failure: Secondary | ICD-10-CM | POA: Diagnosis present

## 2022-05-29 HISTORY — DX: Acute on chronic diastolic (congestive) heart failure: I50.33

## 2022-11-14 ENCOUNTER — Ambulatory Visit: Payer: Medicare Other | Admitting: Internal Medicine

## 2022-11-17 ENCOUNTER — Inpatient Hospital Stay (HOSPITAL_COMMUNITY)
Admit: 2022-11-17 | Discharge: 2022-11-23 | DRG: 871 | Disposition: A | Discharge status: Home / Self Care | Payer: Medicare Other | Source: Other Acute Inpatient Hospital | Attending: Internal Medicine | Admitting: Internal Medicine

## 2022-11-17 ENCOUNTER — Inpatient Hospital Stay: Admit: 2022-11-17 | Payer: Medicare Other

## 2022-11-17 ENCOUNTER — Other Ambulatory Visit: Payer: Self-pay

## 2022-11-17 ENCOUNTER — Encounter (HOSPITAL_COMMUNITY): Payer: Self-pay

## 2022-11-17 ENCOUNTER — Encounter (HOSPITAL_COMMUNITY): Payer: Self-pay | Admitting: Pulmonary Disease

## 2022-11-17 DIAGNOSIS — Z7982 Long term (current) use of aspirin: Secondary | ICD-10-CM | POA: Diagnosis not present

## 2022-11-17 DIAGNOSIS — E876 Hypokalemia: Secondary | ICD-10-CM | POA: Diagnosis present

## 2022-11-17 DIAGNOSIS — L600 Ingrowing nail: Secondary | ICD-10-CM | POA: Diagnosis present

## 2022-11-17 DIAGNOSIS — R911 Solitary pulmonary nodule: Secondary | ICD-10-CM | POA: Diagnosis present

## 2022-11-17 DIAGNOSIS — I130 Hypertensive heart and chronic kidney disease with heart failure and stage 1 through stage 4 chronic kidney disease, or unspecified chronic kidney disease: Secondary | ICD-10-CM | POA: Diagnosis present

## 2022-11-17 DIAGNOSIS — L03115 Cellulitis of right lower limb: Secondary | ICD-10-CM | POA: Insufficient documentation

## 2022-11-17 DIAGNOSIS — A419 Sepsis, unspecified organism: Secondary | ICD-10-CM | POA: Diagnosis not present

## 2022-11-17 DIAGNOSIS — A401 Sepsis due to streptococcus, group B: Secondary | ICD-10-CM | POA: Diagnosis present

## 2022-11-17 DIAGNOSIS — B951 Streptococcus, group B, as the cause of diseases classified elsewhere: Secondary | ICD-10-CM | POA: Diagnosis not present

## 2022-11-17 DIAGNOSIS — R6521 Severe sepsis with septic shock: Secondary | ICD-10-CM | POA: Diagnosis present

## 2022-11-17 DIAGNOSIS — Z85820 Personal history of malignant melanoma of skin: Secondary | ICD-10-CM | POA: Diagnosis not present

## 2022-11-17 DIAGNOSIS — R590 Localized enlarged lymph nodes: Secondary | ICD-10-CM | POA: Diagnosis present

## 2022-11-17 DIAGNOSIS — E785 Hyperlipidemia, unspecified: Secondary | ICD-10-CM | POA: Diagnosis present

## 2022-11-17 DIAGNOSIS — L8990 Pressure ulcer of unspecified site, unspecified stage: Secondary | ICD-10-CM | POA: Insufficient documentation

## 2022-11-17 DIAGNOSIS — Z8249 Family history of ischemic heart disease and other diseases of the circulatory system: Secondary | ICD-10-CM | POA: Diagnosis not present

## 2022-11-17 DIAGNOSIS — I5032 Chronic diastolic (congestive) heart failure: Secondary | ICD-10-CM | POA: Diagnosis present

## 2022-11-17 DIAGNOSIS — Z955 Presence of coronary angioplasty implant and graft: Secondary | ICD-10-CM | POA: Diagnosis not present

## 2022-11-17 DIAGNOSIS — C8298 Follicular lymphoma, unspecified, lymph nodes of multiple sites: Secondary | ICD-10-CM | POA: Diagnosis present

## 2022-11-17 DIAGNOSIS — N182 Chronic kidney disease, stage 2 (mild): Secondary | ICD-10-CM | POA: Diagnosis present

## 2022-11-17 DIAGNOSIS — C8290 Follicular lymphoma, unspecified, unspecified site: Secondary | ICD-10-CM | POA: Diagnosis not present

## 2022-11-17 DIAGNOSIS — I252 Old myocardial infarction: Secondary | ICD-10-CM | POA: Diagnosis not present

## 2022-11-17 DIAGNOSIS — I2510 Atherosclerotic heart disease of native coronary artery without angina pectoris: Secondary | ICD-10-CM | POA: Diagnosis present

## 2022-11-17 DIAGNOSIS — D631 Anemia in chronic kidney disease: Secondary | ICD-10-CM | POA: Diagnosis present

## 2022-11-17 DIAGNOSIS — Z79899 Other long term (current) drug therapy: Secondary | ICD-10-CM

## 2022-11-17 DIAGNOSIS — Z86718 Personal history of other venous thrombosis and embolism: Secondary | ICD-10-CM | POA: Diagnosis not present

## 2022-11-17 DIAGNOSIS — R7881 Bacteremia: Secondary | ICD-10-CM | POA: Diagnosis not present

## 2022-11-17 DIAGNOSIS — R600 Localized edema: Secondary | ICD-10-CM | POA: Diagnosis not present

## 2022-11-17 DIAGNOSIS — Z66 Do not resuscitate: Secondary | ICD-10-CM | POA: Diagnosis present

## 2022-11-17 DIAGNOSIS — L89152 Pressure ulcer of sacral region, stage 2: Secondary | ICD-10-CM | POA: Diagnosis present

## 2022-11-17 DIAGNOSIS — N400 Enlarged prostate without lower urinary tract symptoms: Secondary | ICD-10-CM | POA: Diagnosis present

## 2022-11-17 DIAGNOSIS — D630 Anemia in neoplastic disease: Secondary | ICD-10-CM | POA: Diagnosis present

## 2022-11-17 DIAGNOSIS — C8218 Follicular lymphoma grade II, lymph nodes of multiple sites: Secondary | ICD-10-CM | POA: Diagnosis not present

## 2022-11-17 DIAGNOSIS — I052 Rheumatic mitral stenosis with insufficiency: Secondary | ICD-10-CM | POA: Diagnosis present

## 2022-11-17 LAB — MRSA NEXT GEN BY PCR, NASAL: MRSA by PCR Next Gen: NOT DETECTED

## 2022-11-17 LAB — PROTIME-INR
INR: 1.4 — ABNORMAL HIGH (ref 0.8–1.2)
Prothrombin Time: 16.9 s — ABNORMAL HIGH (ref 11.4–15.2)

## 2022-11-17 MED ORDER — DOCUSATE SODIUM 100 MG PO CAPS: 100.0000 mg | ORAL_CAPSULE | Freq: Two times a day (BID) | ORAL | Status: DC | PRN

## 2022-11-17 MED ORDER — ORAL CARE MOUTH RINSE: 15.0000 mL | OROMUCOSAL | Status: DC | PRN

## 2022-11-17 MED ORDER — SODIUM CHLORIDE 0.9 % IV SOLN
250.0000 mL | INTRAVENOUS | Status: DC
  Administered 2022-11-18: 250 mL via INTRAVENOUS

## 2022-11-17 MED ORDER — ONDANSETRON HCL 4 MG/2ML IJ SOLN: 4.0000 mg | Freq: Four times a day (QID) | INTRAMUSCULAR | Status: DC | PRN

## 2022-11-17 MED ORDER — PRAVASTATIN SODIUM 40 MG PO TABS
40.0000 mg | ORAL_TABLET | Freq: Every day | ORAL | Status: DC
  Administered 2022-11-17 – 2022-11-22 (×6): 40 mg via ORAL
  Filled 2022-11-17: qty 1
  Filled 2022-11-17: qty 2
  Filled 2022-11-17 (×3): qty 1
  Filled 2022-11-17: qty 2

## 2022-11-17 MED ORDER — IPRATROPIUM-ALBUTEROL 0.5-2.5 (3) MG/3ML IN SOLN: 3.0000 mL | Freq: Four times a day (QID) | RESPIRATORY_TRACT | Status: DC | PRN

## 2022-11-17 MED ORDER — ASPIRIN 81 MG PO TBEC
81.0000 mg | DELAYED_RELEASE_TABLET | Freq: Every day | ORAL | Status: DC
  Administered 2022-11-18 – 2022-11-23 (×6): 81 mg via ORAL
  Filled 2022-11-17 (×6): qty 1

## 2022-11-17 MED ORDER — POLYETHYLENE GLYCOL 3350 17 G PO PACK: 17.0000 g | PACK | Freq: Every day | ORAL | Status: DC | PRN

## 2022-11-17 MED ORDER — TIMOLOL MALEATE 0.5 % OP SOLN: 1.0000 [drp] | Freq: Every day | OPHTHALMIC | Status: DC

## 2022-11-17 MED ORDER — LACTATED RINGERS IV BOLUS
1000.0000 mL | Freq: Once | INTRAVENOUS | Status: AC
  Administered 2022-11-17: 1000 mL via INTRAVENOUS

## 2022-11-17 MED ORDER — BRIMONIDINE TARTRATE 0.2 % OP SOLN
1.0000 [drp] | Freq: Two times a day (BID) | OPHTHALMIC | Status: DC
  Administered 2022-11-17 – 2022-11-23 (×12): 1 [drp] via OPHTHALMIC
  Filled 2022-11-17: qty 5

## 2022-11-17 MED ORDER — NOREPINEPHRINE 4 MG/250ML-% IV SOLN
2.0000 ug/min | INTRAVENOUS | Status: DC
  Administered 2022-11-17: 5 ug/min via INTRAVENOUS
  Administered 2022-11-18: 3 ug/min via INTRAVENOUS
  Filled 2022-11-17 (×2): qty 250

## 2022-11-17 MED ORDER — CHLORHEXIDINE GLUCONATE CLOTH 2 % EX PADS
6.0000 | MEDICATED_PAD | Freq: Every day | CUTANEOUS | Status: DC
  Administered 2022-11-17 – 2022-11-18 (×2): 6 via TOPICAL

## 2022-11-17 MED ORDER — ACETAMINOPHEN 325 MG PO TABS: 650.0000 mg | ORAL_TABLET | Freq: Four times a day (QID) | ORAL | Status: DC | PRN

## 2022-11-17 MED ORDER — SODIUM CHLORIDE 0.9 % IV SOLN
2.0000 g | Freq: Two times a day (BID) | INTRAVENOUS | Status: DC
  Administered 2022-11-17 – 2022-11-18 (×2): 2 g via INTRAVENOUS
  Filled 2022-11-17 (×2): qty 12.5

## 2022-11-17 MED ORDER — VANCOMYCIN HCL IN DEXTROSE 1-5 GM/200ML-% IV SOLN
1000.0000 mg | INTRAVENOUS | Status: DC
  Administered 2022-11-17: 1000 mg via INTRAVENOUS
  Filled 2022-11-17: qty 200

## 2022-11-17 MED ORDER — ENOXAPARIN SODIUM 40 MG/0.4ML IJ SOSY
40.0000 mg | PREFILLED_SYRINGE | INTRAMUSCULAR | Status: DC
  Administered 2022-11-17 – 2022-11-22 (×6): 40 mg via SUBCUTANEOUS
  Filled 2022-11-17 (×6): qty 0.4

## 2022-11-17 NOTE — H&P (Signed)
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 transferred from outside hospital with septic shock due to cellulitis.  Felt bad a couple days.  Some rigors.  Swelling the leg.  Went to outside hospital.  Erythematous right leg.  Started on antibiotics.  Developed hypotension and transferred to [facility] given lack of ICU at sending facility.  CT venogram demonstrated bulky lymphadenopathy in the right inguinal canal causing likely compression of the venous flow.  Initial blood cultures notable for group B strep on molecular testing.  Cefepime and vancomycin ordered.  Received vancomycin and cefepime 8/31 late PM I do not see additional doses given.  Given 2 L crystalloid.  Blood pressure soft.  Hypotensive.  Started on pressors.  Transferred here.  Labs with leukocytosis left shift, otherwise relatively stable.  Pertinent  Medical History  Lymphoma on surveillance, history of melanoma  Significant Hospital Events: Including procedures, antibiotic start and stop dates in addition to other pertinent events   8/31 presents outside hospital with leg swelling, treated for cellulitis and sepsis 9/1 developed hypotension started norepinephrine transferred from outside hospital to [facility], group B strep on initial molecular testing blood cultures  Interim History / Subjective:    Objective   Blood pressure (!) 119/46, pulse 77, temperature 98.6 F (37 C), temperature source Axillary, resp. rate 15, weight 75.5 kg, SpO2 93%.       No intake or output data in the 24 hours ending 11/17/22 1800 Filed Weights   11/17/22 1603   Weight: 75.5 kg    Examination: General: Elderly, verbose, lying in bed in no acute distress Eyes: EOMI, no icterus Neck: Supple, no JVP Lungs: Normal work of breathing, on room air Cardiovascular: Regular rate and rhythm, warm Abdomen: Nondistended, bowel sounds present Extremities: Right lower extremity edematous compared to left scattered petechial changes, erythema inside right shin and groin with normal-appearing skin in between Neuro: Alert, oriented, a bit hard of hearing, no focal motor deficits Psych: Normal mood, full affect  Resolved Hospital Problem list     Assessment & Plan:  Septic shock due to group B strep bacteremia presumably source cellulitis: Edematous and red right lower extremity. -- Continue vancomycin and cefepime, contact [facility] for updates on sensitivities etc. as able -- Norepinephrine, MAP around 65 -- Status post 2 L LR over last several hours at outside hospital, additional 1 L LR now  Right bulky inguinal lymphadenopathy: Enlarged from most recent PET scan, imaging here.  Suspect worsening lymphoma.  Family endorses worsening lymphadenopathy in neck etc. -- Nonurgent consult to oncology, has upcoming appointment 9/9  History of hypertension: Hold home antihypertensives  Hyperlipidemia: Resume home statin  Best Practice (right click and "Reselect all SmartList Selections" daily)   Diet/type: Regular consistency (see orders) DVT prophylaxis: LMWH GI prophylaxis: N/A Lines: N/A Foley:  N/A Code Status:  DNR Last date of multidisciplinary goals of care discussion [discussed with patient on admission, family in room, he confirms DNR, full scope of care up to resuscitative efforts, life support etc.]  Labs   CBC: No results for input(s): "WBC", "NEUTROABS", "HGB", "HCT", "MCV", "PLT" in the last 168 hours.  Basic Metabolic  Panel: No results for input(s): "NA", "K", "CL", "CO2", "GLUCOSE", "BUN", "CREATININE", "CALCIUM", "MG", "PHOS" in the last  168 hours. GFR: CrCl cannot be calculated (Patient's most recent lab result is older than the maximum 21 days allowed.). No results for input(s): "PROCALCITON", "WBC", "LATICACIDVEN" in the last 168 hours.  Liver Function Tests: No results for input(s): "AST", "ALT", "ALKPHOS", "BILITOT", "PROT", "ALBUMIN" in the last 168 hours. No results for input(s): "LIPASE", "AMYLASE" in the last 168 hours. No results for input(s): "AMMONIA" in the last 168 hours.  ABG No results found for: "PHART", "PCO2ART", "PO2ART", "HCO3", "TCO2", "ACIDBASEDEF", "O2SAT"   Coagulation Profile: No results for input(s): "INR", "PROTIME" in the last 168 hours.  Cardiac Enzymes: No results for input(s): "CKTOTAL", "CKMB", "CKMBINDEX", "TROPONINI" in the last 168 hours.  HbA1C: No results found for: "HGBA1C"  CBG: No results for input(s): "GLUCAP" in the last 168 hours.  Review of Systems:   No chest pain with exertion orthopnea or PND, no abdominal pain shortness of breath.  Comprehensive review of systems otherwise negative.  Past Medical History:  He,  has a past medical history of BPH (benign prostatic hypertrophy), CAD (coronary artery disease) (01/2010), DVT (deep venous thrombosis) (HCC), follicular lymphoma (dx'd 06/2017), Hernia, HLD (hyperlipidemia), and HTN (hypertension).   Surgical History:   Past Surgical History:  Procedure Laterality Date   BACK SURGERY     CYSTOSCOPY WITH LITHOLAPAXY N/A 05/20/2019   Procedure: CYSTOSCOPY WITH LITHOLAPAXY;  Surgeon: Crist Fat, MD;  Location: [facility];  Service: Urology;  Laterality: N/A;   CYSTOSCOPY/URETEROSCOPY/HOLMIUM LASER/STENT PLACEMENT Left 05/20/2019   Procedure: CYSTOSCOPY/URETEROSCOPY/HOLMIUM LASER/STENT PLACEMENT;  Surgeon: Crist Fat, MD;  Location: [facility];  Service: Urology;  Laterality: Left;   HERNIA REPAIR  01/08/11   RIH   MASS EXCISION Right 08/05/2017   Procedure: EXCISION RIGHT POSTERIOR  NECK LYMPH NODE;  Surgeon: Drema Halon, MD;  Location: [facility];  Service: ENT;  Laterality: Right;     Social History:   reports that he has never smoked. He has never used smokeless tobacco. He reports that he does not drink alcohol and does not use drugs.   Family History:  His family history includes Heart failure in his mother.   Allergies No Known Allergies   Home Medications  Prior to Admission medications   Medication Sig Start Date End Date Taking? Authorizing Provider  aspirin EC 81 MG tablet Take 1 tablet (81 mg total) by mouth daily. Swallow whole. 11/17/20   Kathleene Hazel, MD  atenolol (TENORMIN) 25 MG tablet Take 1 tablet (25 mg total) by mouth daily. 04/16/18   Kathleene Hazel, MD  brimonidine (ALPHAGAN) 0.2 % ophthalmic solution Place 1 drop into the right eye 2 (two) times daily.    [provider]  cholecalciferol (VITAMIN D) 1000 UNITS tablet Take 1,000 Units by mouth daily.    [provider]  Cyanocobalamin (B-12) 2500 MCG TABS Take 2,500 mcg by mouth daily.    [provider]  finasteride (PROSCAR) 5 MG tablet Take 5 mg by mouth daily.    [provider]  furosemide (LASIX) 20 MG tablet TAKE 1 TABLET BY MOUTH TWICE  DAILY 02/19/22   Dyann Kief, PA-C  gabapentin (NEURONTIN) 100 MG capsule Take 100 mg by mouth daily as needed (knee pain).  02/09/19 06/11/21  [provider]  gabapentin (NEURONTIN) 100 MG capsule Take 1 capsule by mouth 2 (two) times daily. 10/24/21   [provider]  isosorbide dinitrate (ISORDIL) 5 MG tablet Take 1 tablet (5 mg total) by mouth 2 (two) times daily. 04/16/18   Kathleene Hazel, MD  lisinopril (ZESTRIL) 5 MG tablet Take 5 mg by mouth daily. 04/25/20   [provider]  nitroGLYCERIN (NITROSTAT) 0.4 MG SL tablet Place 1 tablet (0.4 mg total) under the tongue every 5 (five) minutes as needed for chest pain. 04/02/17 06/11/21  Kathleene Hazel, MD  ofloxacin  (OCUFLOX) 0.3 % ophthalmic solution SMARTSIG:In Eye(s) 09/24/21   [provider]  potassium chloride SA (KLOR-CON M) 20 MEQ tablet Take 1 tablet (20 mEq total) by mouth 2 (two) times daily. Please keep upcoming appointment for future refills. Thank you. 12/19/21   Kathleene Hazel, MD  pravastatin (PRAVACHOL) 40 MG tablet Take 1 tablet (40 mg total) by mouth at bedtime. 12/26/21   Kathleene Hazel, MD  timolol (TIMOPTIC) 0.5 % ophthalmic solution Place 1 drop into the right eye daily. Patient not taking: Reported on 07/11/2021    [provider]     Critical care time:     CRITICAL CARE Performed by: Karren Burly   Total critical care time: 45 minutes  Critical care time was exclusive of separately billable procedures and treating other patients.  Critical care was necessary to treat or prevent imminent or life-threatening deterioration.  Critical care was time spent personally by me on the following activities: development of treatment plan with patient and/or surrogate as well as nursing, discussions with consultants, evaluation of patient's response to treatment, examination of patient, obtaining history from patient or surrogate, ordering and performing treatments and interventions, ordering and review of laboratory studies, ordering and review of radiographic studies, pulse oximetry and re-evaluation of patient's condition.  Karren Burly, MD

## 2022-11-17 NOTE — Plan of Care (Signed)

## 2022-11-17 NOTE — Plan of Care (Signed)
  Problem: Education: Goal: Knowledge of General Education information will improve Description: Including pain rating scale, medication(s)/side effects and non-pharmacologic comfort measures Outcome: Progressing   Problem: Health Behavior/Discharge Planning: Goal: Ability to manage health-related needs will improve Outcome: Progressing   Problem: Clinical Measurements: Goal: Ability to maintain clinical measurements within normal limits will improve Outcome: Progressing Goal: Diagnostic test results will improve Outcome: Progressing Goal: Respiratory complications will improve Outcome: Progressing   Problem: Nutrition: Goal: Adequate nutrition will be maintained Outcome: Progressing   

## 2022-11-17 NOTE — Progress Notes (Signed)
Pharmacy Antibiotic Note  Harold [name] is a 88 y.o. male admitted on 11/17/2022 with cellulitis, bacteremia. Pharmacy has been consulted for vancomycin and cefepime dosing.  Patient transferring from [facility] with septic shock due to cellulitis and group b strep bacteremia. Per Care Everywhere, patient was given a loading dose of IV vancomycin 2000mg  8/31 @2310  and a dose of IV cefepime 2g 8/31 @2222 . Last Scr 1.30 on 8/31 while at [facility] (calculated CrCl ~68ml/min).  Plan: - Start IV vancomycin 1000mg  q24hrs (eAUC 480 using Scr 1.30, IBW, Vd 0.72) - Start cefepime 2g q12hrs  - Vancomycin levels as needed  - Monitor renal function and overall clinical picture - De-escalate antibiotics as able  Weight: 75.5 kg (166 lb 7.2 oz)  Temp (24hrs), Avg:98.6 F (37 C), Min:98.6 F (37 C), Max:98.6 F (37 C)  No results for input(s): "WBC", "CREATININE", "LATICACIDVEN", "VANCOTROUGH", "VANCOPEAK", "VANCORANDOM", "GENTTROUGH", "GENTPEAK", "GENTRANDOM", "TOBRATROUGH", "TOBRAPEAK", "TOBRARND", "AMIKACINPEAK", "AMIKACINTROU", "AMIKACIN" in the last 168 hours.  CrCl cannot be calculated (Patient's most recent lab result is older than the maximum 21 days allowed.).    No Known Allergies  Antimicrobials this admission: 8/31 vancomycin >>  8/31 cefepime >>   Dose adjustments this admission: N/A  Microbiology results: 8/31 BCx: group b strep (from [facility]) 9/1 MRSA PCR: negative   Thank you for allowing pharmacy to be a part of this patient's care.  Cherylin Mylar, PharmD Clinical Pharmacist  9/1/20246:06 PM

## 2022-11-17 NOTE — Progress Notes (Signed)
Routine USG PIV requested.   Zoe RN on unit to attempt USG PIV placement.  Will reconsult if unsuccessful.

## 2022-11-18 DIAGNOSIS — R6521 Severe sepsis with septic shock: Secondary | ICD-10-CM | POA: Diagnosis not present

## 2022-11-18 DIAGNOSIS — A419 Sepsis, unspecified organism: Secondary | ICD-10-CM | POA: Diagnosis not present

## 2022-11-18 LAB — CBC WITH DIFFERENTIAL/PLATELET
Abs Immature Granulocytes: 0.1 10*3/uL — ABNORMAL HIGH (ref 0.00–0.07)
Basophils Absolute: 0 10*3/uL (ref 0.0–0.1)
Basophils Relative: 0 %
Eosinophils Absolute: 0.1 10*3/uL (ref 0.0–0.5)
Eosinophils Relative: 1 %
HCT: 32.6 % — ABNORMAL LOW (ref 39.0–52.0)
Hemoglobin: 10.3 g/dL — ABNORMAL LOW (ref 13.0–17.0)
Immature Granulocytes: 1 %
Lymphocytes Relative: 8 %
Lymphs Abs: 1.2 10*3/uL (ref 0.7–4.0)
MCH: 29 pg (ref 26.0–34.0)
MCHC: 31.6 g/dL (ref 30.0–36.0)
MCV: 91.8 fL (ref 80.0–100.0)
Monocytes Absolute: 1.4 10*3/uL — ABNORMAL HIGH (ref 0.1–1.0)
Monocytes Relative: 9 %
Neutro Abs: 13.1 10*3/uL — ABNORMAL HIGH (ref 1.7–7.7)
Neutrophils Relative %: 81 %
Platelets: 183 10*3/uL (ref 150–400)
RBC: 3.55 MIL/uL — ABNORMAL LOW (ref 4.22–5.81)
RDW: 14.5 % (ref 11.5–15.5)
WBC: 15.9 10*3/uL — ABNORMAL HIGH (ref 4.0–10.5)
nRBC: 0 % (ref 0.0–0.2)

## 2022-11-18 LAB — BASIC METABOLIC PANEL
Anion gap: 7 (ref 5–15)
BUN: 26 mg/dL — ABNORMAL HIGH (ref 8–23)
CO2: 23 mmol/L (ref 22–32)
Calcium: 8.2 mg/dL — ABNORMAL LOW (ref 8.9–10.3)
Chloride: 106 mmol/L (ref 98–111)
Creatinine, Ser: 0.96 mg/dL (ref 0.61–1.24)
GFR, Estimated: >60 mL/min (ref >60)
Glucose, Bld: 127 mg/dL — ABNORMAL HIGH (ref 70–99)
Potassium: 3.4 mmol/L — ABNORMAL LOW (ref 3.5–5.1)
Sodium: 136 mmol/L (ref 135–145)

## 2022-11-18 LAB — MAGNESIUM: Magnesium: 2 mg/dL (ref 1.7–2.4)

## 2022-11-18 MED ORDER — POTASSIUM CHLORIDE CRYS ER 20 MEQ PO TBCR
40.0000 meq | EXTENDED_RELEASE_TABLET | Freq: Once | ORAL | Status: AC
  Administered 2022-11-18: 40 meq via ORAL
  Filled 2022-11-18: qty 2

## 2022-11-18 MED ORDER — SODIUM CHLORIDE 0.9 % IV SOLN
2.0000 g | INTRAVENOUS | Status: DC
  Administered 2022-11-18 – 2022-11-19 (×2): 2 g via INTRAVENOUS
  Filled 2022-11-18 (×2): qty 20

## 2022-11-18 NOTE — Progress Notes (Addendum)
NAME:  Harold [name], MRN:  [id number], DOB:  20-Oct-1934, LOS: 1 ADMISSION DATE:  11/17/2022, CONSULTATION DATE:  n/a REFERRING MD:  [facility], CHIEF COMPLAINT:  R leg swelling   History of Present Illness:  88 yo male presented to outside hospital with fever, rigors, and Rt leg swelling.  Started on ABx and pressors.  CT venogram from 11/16/22 showed extensive retroperitoneal LAN, 7.4 cm mass-like lesion in Rt inguinal space concerning for malignancy or necrotic LN causing venous compression.  Blood culture showed Group B Streptococcus.  Transferred to [facility] for further management of septic shock from cellulitis.  Pertinent  Medical History  BPH, CAD, DVT, Low grade follicular lymphoma, HLD, HTN, Melanoma, HFpEF, B12 deficiency  Significant Hospital Events: Including procedures, antibiotic start and stop dates in addition to other pertinent events   8/31 presents outside hospital with leg swelling, treated for cellulitis and sepsis 9/01 developed hypotension started norepinephrine transferred from outside hospital to [facility], group B strep on initial molecular testing blood cultures  Interim History / Subjective:  Feels better.  Denies chest pain, dyspnea, nausea.  Rt leg doesn't feel as swollen.  Objective   Blood pressure (!) 105/37, pulse 67, temperature 97.9 F (36.6 C), temperature source Oral, resp. rate 12, height 5' 9.5" (1.765 m), weight 76.6 kg, SpO2 99%.        Intake/Output Summary (Last 24 hours) at 11/18/2022 1304 Last data filed at 11/18/2022 1127 Gross per 24 hour  Intake 532.03 ml  Output 725 ml  Net -192.97 ml   Filed Weights   11/17/22 1603 11/18/22 0426  Weight: 75.5 kg 76.6 kg    Examination:  General - alert Eyes - pupils reactive ENT - no sinus tenderness, no stridor Cardiac - regular rate/rhythm, no murmur Chest - equal breath sounds b/l, no wheezing or rales Abdomen - soft, non tender, + bowel sounds Extremities - Rt leg swollen, warm areas of  redness around lower leg Skin - no rashes Neuro - normal strength, moves extremities, follows commands Psych - normal mood and behavior  Resolved Hospital Problem list     Assessment & Plan:   Septic shock from Group B Streptococcal bacteremia in setting of Rt leg cellulitis. - wean pressors to keep MAP > 65 - will change ABx to rocephin; will need two weeks of Abx - f/u Echo  Rt inguinal adenopathy causing compression of Rt leg venous system. Hx of follicular lymphoma and melanoma. - followed by Dr. Candise Che with oncology >> has outpt appointment schedule 11/25/22 - might need inpatient oncology assessment and tissue sampling this admission  CAD s/p stent. Hx of HLD, HTN. - continue ASA, pravastatin - hold outpt tenormin, lasix, isordil, lisinopril  Anemia of critical illness. - follow up CBC  Hypokalemia. - replace as needed  Best Practice (right click and "Reselect all SmartList Selections" daily)   Diet/type: Regular consistency (see orders) DVT prophylaxis: LMWH GI prophylaxis: N/A Lines: N/A Foley:  N/A Code Status:  DNR Last date of multidisciplinary goals of care discussion [updated his family at bedside]  Labs       Latest Ref Rng & Units 11/18/2022    3:16 AM 05/27/2022   11:09 AM 12/26/2021    9:29 AM  CMP  Glucose 70 - 99 mg/dL 366  90    BUN 8 - 23 mg/dL 26  36    Creatinine 4.40 - 1.24 mg/dL 3.47  4.25    Sodium 956 - 145 mmol/L 136  140  Potassium 3.5 - 5.1 mmol/L 3.4  4.1    Chloride 98 - 111 mmol/L 106  101    CO2 22 - 32 mmol/L 23  30    Calcium 8.9 - 10.3 mg/dL 8.2  9.8    Total Protein 6.5 - 8.1 g/dL  7.1  6.4   Total Bilirubin 0.3 - 1.2 mg/dL  0.8  0.6   Alkaline Phos 38 - 126 U/L  68  65   AST 15 - 41 U/L  15  17   ALT 0 - 44 U/L  9  12       Latest Ref Rng & Units 11/18/2022    3:16 AM 05/27/2022   11:09 AM 12/11/2021    9:26 AM  CBC  WBC 4.0 - 10.5 K/uL 15.9  8.0  6.5   Hemoglobin 13.0 - 17.0 g/dL 52.7  78.2  42.3   Hematocrit 39.0  - 52.0 % 32.6  37.6  37.9   Platelets 150 - 400 K/uL 183  241  229    Signature:  Coralyn Helling, MD [place] [facility] Pager - [phone] or [phone] 11/18/2022, 1:04 PM

## 2022-11-18 NOTE — Progress Notes (Signed)
[facility] ADULT ICU REPLACEMENT PROTOCOL   The patient does apply for the [facility] Adult ICU Electrolyte Replacment Protocol based on the criteria listed below:   1.Exclusion criteria: TCTS, ECMO, Dialysis, and Myasthenia Gravis patients 2. Is GFR >/= 30 ml/min? Yes.    Patient's GFR today is >60 3. Is SCr </= 2? Yes.   Patient's SCr is 0.96 mg/dL 4. Did SCr increase >/= 0.5 in 24 hours? No. 5.Pt's weight >40kg  Yes.   6. Abnormal electrolyte(s): K+ 3.4  7. Electrolytes replaced per protocol 8.  Call MD STAT for K+ </= 2.5, Phos </= 1, or Mag </= 1 Physician:  Donella Stade [facility] 11/18/2022 4:18 AM

## 2022-11-19 ENCOUNTER — Inpatient Hospital Stay (HOSPITAL_COMMUNITY): Payer: Medicare Other

## 2022-11-19 ENCOUNTER — Encounter (HOSPITAL_COMMUNITY): Payer: Self-pay | Admitting: Pulmonary Disease

## 2022-11-19 DIAGNOSIS — A419 Sepsis, unspecified organism: Secondary | ICD-10-CM | POA: Diagnosis not present

## 2022-11-19 DIAGNOSIS — R7881 Bacteremia: Secondary | ICD-10-CM | POA: Diagnosis not present

## 2022-11-19 DIAGNOSIS — R6521 Severe sepsis with septic shock: Secondary | ICD-10-CM | POA: Diagnosis not present

## 2022-11-19 LAB — CBC WITH DIFFERENTIAL/PLATELET
Abs Immature Granulocytes: 0.04 10*3/uL (ref 0.00–0.07)
Basophils Absolute: 0 10*3/uL (ref 0.0–0.1)
Basophils Relative: 0 %
Eosinophils Absolute: 0.2 10*3/uL (ref 0.0–0.5)
Eosinophils Relative: 2 %
HCT: 31.7 % — ABNORMAL LOW (ref 39.0–52.0)
Hemoglobin: 10 g/dL — ABNORMAL LOW (ref 13.0–17.0)
Immature Granulocytes: 1 %
Lymphocytes Relative: 13 %
Lymphs Abs: 1 10*3/uL (ref 0.7–4.0)
MCH: 28.5 pg (ref 26.0–34.0)
MCHC: 31.5 g/dL (ref 30.0–36.0)
MCV: 90.3 fL (ref 80.0–100.0)
Monocytes Absolute: 0.7 10*3/uL (ref 0.1–1.0)
Monocytes Relative: 9 %
Neutro Abs: 5.7 10*3/uL (ref 1.7–7.7)
Neutrophils Relative %: 75 %
Platelets: 175 10*3/uL (ref 150–400)
RBC: 3.51 MIL/uL — ABNORMAL LOW (ref 4.22–5.81)
RDW: 14.5 % (ref 11.5–15.5)
WBC: 7.6 10*3/uL (ref 4.0–10.5)
nRBC: 0 % (ref 0.0–0.2)

## 2022-11-19 LAB — BASIC METABOLIC PANEL
Anion gap: 6 (ref 5–15)
BUN: 19 mg/dL (ref 8–23)
CO2: 25 mmol/L (ref 22–32)
Calcium: 8.4 mg/dL — ABNORMAL LOW (ref 8.9–10.3)
Chloride: 109 mmol/L (ref 98–111)
Creatinine, Ser: 0.86 mg/dL (ref 0.61–1.24)
GFR, Estimated: >60 mL/min (ref >60)
Glucose, Bld: 99 mg/dL (ref 70–99)
Potassium: 3.9 mmol/L (ref 3.5–5.1)
Sodium: 140 mmol/L (ref 135–145)

## 2022-11-19 LAB — ECHOCARDIOGRAM COMPLETE
Area-P 1/2: 2.47 cm2
Height: 69.5 in
MV VTI: 2.04 cm2
S' Lateral: 2.7 cm
Weight: 2709.01 [oz_av]

## 2022-11-19 MED ORDER — IOHEXOL 9 MG/ML PO SOLN: 500.0000 mL | ORAL | Status: AC

## 2022-11-19 MED ORDER — IOHEXOL 300 MG/ML  SOLN
100.0000 mL | Freq: Once | INTRAMUSCULAR | Status: AC | PRN
  Administered 2022-11-19: 100 mL via INTRAVENOUS

## 2022-11-19 NOTE — Progress Notes (Signed)
Harold [name] would like CT C/A/P and radonc to eval for potential targeted radiation to R groin depending on findings.  Myrla Halsted MD PCCM

## 2022-11-19 NOTE — Plan of Care (Signed)

## 2022-11-19 NOTE — Plan of Care (Signed)
  Problem: Health Behavior/Discharge Planning: Goal: Ability to manage health-related needs will improve Outcome: Progressing   Problem: Activity: Goal: Risk for activity intolerance will decrease Outcome: Progressing   Problem: Nutrition: Goal: Adequate nutrition will be maintained Outcome: Progressing   Problem: Clinical Measurements: Goal: Will remain free from infection Outcome: Not Progressing

## 2022-11-19 NOTE — Progress Notes (Signed)
NAME:  Harold [name], MRN:  [id number], DOB:  06-Oct-1934, LOS: 2 ADMISSION DATE:  11/17/2022, CONSULTATION DATE:  n/a REFERRING MD:  [facility], CHIEF COMPLAINT:  septic shock   History of Present Illness:  88 year old male with past medical history of HTN, CAD, Non-Hodgkin's Lymphoma, MI, DVT, CHF who presents to [facility] ICU from [facility] for septic shock.   He initially presented to [facility] ED on 11/16/2022 with fever, RLE swelling and redness. Had been having chills at home prior to this. At [facility], CT venogram performed which did not demonstrate DVT. It did show bulky lymphadenopathy concerning for progressing lymphoma, venous compression which likely precipitated cellulitis . Started on antibiotics. On admission to [facility], worsening WBC, hypotension, increasing lactic and was transferred to [facility] ICU for ongoing management. Initial blood cultures grew 1 of 2 bottles GBS and started on vancomycin and cefepime. Additionally, had total of 3L crystalloid. Required vasopressor use.   Pertinent  Medical History  HTN, HLD, follicular lymphoma, DVT, CAD, MI, CHF  Significant Hospital Events: Including procedures, antibiotic start and stop dates in addition to other pertinent events   9/1: admitted to [facility] ICU from [facility] [place]. +GBS on blood cultures  9/2: pressors stopped  9/3: WBC continue to improve, no fevers. Assessment without evidence of cellulitis. BP soft but maintaining MAP without pressor support for ~24 hours.   Interim History / Subjective:  Feels okay today. States he cannot get up and walk much because concern over BP being soft. Denies having chest pain, SOB, dizziness with standing or ambulating.   Objective   Blood pressure (!) 115/49, pulse 67, temperature 98.8 F (37.1 C), temperature source Oral, resp. rate 12, height 5' 9.5" (1.765 m), weight 76.8 kg, SpO2 97%.        Intake/Output Summary (Last 24 hours) at 11/19/2022 1249 Last data filed at  11/19/2022 0841 Gross per 24 hour  Intake 648.72 ml  Output 400 ml  Net 248.72 ml   Filed Weights   11/17/22 1603 11/18/22 0426 11/19/22 0423  Weight: 75.5 kg 76.6 kg 76.8 kg    Examination: General: elderly male sitting oob in chair.  HENT: EOM intact.  Lungs: bilateral breath sounds clear Cardiovascular:  S1/S2 without murmur, rub, gallop Abdomen: soft Extremities: R>L lower extremity pitting edema. No obvious cellulitis present.  Neuro: alert and oriented. Moves all extremities well.   Resolved Hospital Problem list   Shock   Assessment & Plan:  Septic shock 2/2 group B streptococcal bacteremia from right lower extremity cellulitis  - 1 of 2 bottles grew GBS. ?contaminant  - shock resolved. Levo stopped 9/2 ~ noon.  - has been afebrile throughout stay.  - WBC 15 >7 - Echo wnl  - lower extremity without redness/warmth.  - continue IV antibiotics while inpatient   Right inguinal bulky adenopathy with compression to right lower extremity venous system Follicular lymphoma  - has outpatient appointment 11/25/22 with Dr. Candise Che oncology  - will consult general surgery about possible biopsy while inpatient   CAD s/p stent  HTN, HLD - continue to hold tenormin, lasix, isordil, lisinopril until BP remains up on floor - continue aspirin, pravastatin   Anemia  - Current Hgb 10. Will send anemia panel with next AM labs.  - appears baseline is 11-12.  - no evidence of active bleeding on exam. Not anticoagulated.   Best Practice (right click and "Reselect all SmartList Selections" daily)   Diet/type: Regular consistency (see orders) DVT prophylaxis:  [facility] GI prophylaxis: N/A Lines: N/A Foley:  N/A Code Status:  limited Last date of multidisciplinary goals of care discussion [plan of care discussed with patient at bedside]  Labs   CBC: Recent Labs  Lab 11/18/22 0316 11/19/22 0259  WBC 15.9* 7.6  NEUTROABS 13.1* 5.7  HGB 10.3* 10.0*  HCT 32.6* 31.7*  MCV 91.8 90.3   PLT 183 175    Basic Metabolic Panel: Recent Labs  Lab 11/18/22 0316 11/19/22 0259  NA 136 140  K 3.4* 3.9  CL 106 109  CO2 23 25  GLUCOSE 127* 99  BUN 26* 19  CREATININE 0.96 0.86  CALCIUM 8.2* 8.4*  MG 2.0  --    GFR: Estimated Creatinine Clearance: 62.7 mL/min (by C-G formula based on SCr of 0.86 mg/dL). Recent Labs  Lab 11/18/22 0316 11/19/22 0259  WBC 15.9* 7.6    Liver Function Tests: No results for input(s): "AST", "ALT", "ALKPHOS", "BILITOT", "PROT", "ALBUMIN" in the last 168 hours. No results for input(s): "LIPASE", "AMYLASE" in the last 168 hours. No results for input(s): "AMMONIA" in the last 168 hours.  ABG No results found for: "PHART", "PCO2ART", "PO2ART", "HCO3", "TCO2", "ACIDBASEDEF", "O2SAT"   Coagulation Profile: Recent Labs  Lab 11/17/22 1826  INR 1.4*    Cardiac Enzymes: No results for input(s): "CKTOTAL", "CKMB", "CKMBINDEX", "TROPONINI" in the last 168 hours.  HbA1C: No results found for: "HGBA1C"  CBG: No results for input(s): "GLUCAP" in the last 168 hours.  Review of Systems:   No complaints  Past Medical History:  He,  has a past medical history of BPH (benign prostatic hypertrophy), CAD (coronary artery disease) (01/2010), DVT (deep venous thrombosis) (HCC), follicular lymphoma (dx'd 06/2017), Hernia, HLD (hyperlipidemia), and HTN (hypertension).   Surgical History:   Past Surgical History:  Procedure Laterality Date   BACK SURGERY     CYSTOSCOPY WITH LITHOLAPAXY N/A 05/20/2019   Procedure: CYSTOSCOPY WITH LITHOLAPAXY;  Surgeon: Crist Fat, MD;  Location: [facility];  Service: Urology;  Laterality: N/A;   CYSTOSCOPY/URETEROSCOPY/HOLMIUM LASER/STENT PLACEMENT Left 05/20/2019   Procedure: CYSTOSCOPY/URETEROSCOPY/HOLMIUM LASER/STENT PLACEMENT;  Surgeon: Crist Fat, MD;  Location: [facility];  Service: Urology;  Laterality: Left;   HERNIA REPAIR  01/08/11   RIH   MASS EXCISION Right 08/05/2017   Procedure: EXCISION  RIGHT POSTERIOR  NECK LYMPH NODE;  Surgeon: Drema Halon, MD;  Location: [facility];  Service: ENT;  Laterality: Right;     Social History:   reports that he has never smoked. He has never used smokeless tobacco. He reports that he does not drink alcohol and does not use drugs.   Family History:  His family history includes Heart failure in his mother.   Allergies No Known Allergies   Home Medications  Prior to Admission medications   Medication Sig Start Date End Date Taking? Authorizing Provider  aspirin EC 81 MG tablet Take 1 tablet (81 mg total) by mouth daily. Swallow whole. 11/17/20  Yes Kathleene Hazel, MD  atenolol (TENORMIN) 25 MG tablet Take 1 tablet (25 mg total) by mouth daily. 04/16/18  Yes Kathleene Hazel, MD  brimonidine (ALPHAGAN) 0.2 % ophthalmic solution Place 1 drop into the right eye 2 (two) times daily.   Yes [provider]  cholecalciferol (VITAMIN D) 1000 UNITS tablet Take 1,000 Units by mouth daily.   Yes [provider]  Cyanocobalamin (B-12) 2500 MCG TABS Take 2,500 mcg by mouth daily.   Yes [provider]  finasteride (PROSCAR) 5 MG tablet Take 5 mg by mouth daily.   Yes [provider]  furosemide (LASIX) 20 MG tablet TAKE 1 TABLET BY MOUTH TWICE  DAILY 02/19/22  Yes Dyann Kief, PA-C  gabapentin (NEURONTIN) 100 MG capsule Take 1 capsule by mouth 2 (two) times daily as needed (for knee pain). 10/24/21  Yes [provider]  isosorbide dinitrate (ISORDIL) 5 MG tablet Take 1 tablet (5 mg total) by mouth 2 (two) times daily. 04/16/18  Yes Kathleene Hazel, MD  lisinopril (ZESTRIL) 5 MG tablet Take 5 mg by mouth daily. 04/25/20  Yes [provider]  nitroGLYCERIN (NITROSTAT) 0.4 MG SL tablet Place 1 tablet (0.4 mg total) under the tongue every 5 (five) minutes as needed for chest pain. 04/02/17 11/17/22 Yes Kathleene Hazel, MD  potassium chloride SA (KLOR-CON M) 20  MEQ tablet Take 1 tablet (20 mEq total) by mouth 2 (two) times daily. Please keep upcoming appointment for future refills. Thank you. 12/19/21  Yes Kathleene Hazel, MD  pravastatin (PRAVACHOL) 40 MG tablet Take 1 tablet (40 mg total) by mouth at bedtime. 12/26/21  Yes Kathleene Hazel, MD  ofloxacin (OCUFLOX) 0.3 % ophthalmic solution SMARTSIG:In Eye(s) Patient not taking: Reported on 11/17/2022 09/24/21   [provider]  timolol (TIMOPTIC) 0.5 % ophthalmic solution Place 1 drop into the right eye daily. Patient not taking: Reported on 07/11/2021    [provider]     Critical care time:

## 2022-11-19 NOTE — Progress Notes (Signed)
  Echocardiogram 2D Echocardiogram has been performed.  Leda Roys RDCS 11/19/2022, 9:45 AM

## 2022-11-19 NOTE — Care Management (Signed)
Transition of Care [facility]) - Inpatient Brief Assessment   Patient Details  Name: Harold [name] MRN: [id number] Date of Birth: 1934-07-08  Transition of Care [facility]) CM/SW Contact:    Lavenia Atlas, RN Phone Number: 11/19/2022, 5:33 PM   Clinical Narrative: Pr chart review no current TOC needs.   If any needs arise please initiate a TOC consult.   Transition of Care Asessment: Insurance and Status: Insurance coverage has been reviewed Patient has primary care physician: Yes Home environment has been reviewed: from home Prior level of function:: independent Prior/Current Home Services: No current home services Social Determinants of Health Reivew: SDOH reviewed no interventions necessary Readmission risk has been reviewed: Yes Transition of care needs: no transition of care needs at this time

## 2022-11-20 DIAGNOSIS — L03115 Cellulitis of right lower limb: Secondary | ICD-10-CM | POA: Diagnosis not present

## 2022-11-20 DIAGNOSIS — B951 Streptococcus, group B, as the cause of diseases classified elsewhere: Secondary | ICD-10-CM

## 2022-11-20 DIAGNOSIS — R600 Localized edema: Secondary | ICD-10-CM | POA: Diagnosis not present

## 2022-11-20 DIAGNOSIS — C8298 Follicular lymphoma, unspecified, lymph nodes of multiple sites: Secondary | ICD-10-CM | POA: Diagnosis not present

## 2022-11-20 DIAGNOSIS — A419 Sepsis, unspecified organism: Secondary | ICD-10-CM | POA: Diagnosis not present

## 2022-11-20 DIAGNOSIS — R6521 Severe sepsis with septic shock: Secondary | ICD-10-CM | POA: Diagnosis not present

## 2022-11-20 LAB — CBC WITH DIFFERENTIAL/PLATELET
Abs Immature Granulocytes: 0.07 10*3/uL (ref 0.00–0.07)
Basophils Absolute: 0 10*3/uL (ref 0.0–0.1)
Basophils Relative: 0 %
Eosinophils Absolute: 0.3 10*3/uL (ref 0.0–0.5)
Eosinophils Relative: 4 %
HCT: 31.8 % — ABNORMAL LOW (ref 39.0–52.0)
Hemoglobin: 10 g/dL — ABNORMAL LOW (ref 13.0–17.0)
Immature Granulocytes: 1 %
Lymphocytes Relative: 15 %
Lymphs Abs: 1 10*3/uL (ref 0.7–4.0)
MCH: 28.3 pg (ref 26.0–34.0)
MCHC: 31.4 g/dL (ref 30.0–36.0)
MCV: 90.1 fL (ref 80.0–100.0)
Monocytes Absolute: 0.6 10*3/uL (ref 0.1–1.0)
Monocytes Relative: 10 %
Neutro Abs: 4.5 10*3/uL (ref 1.7–7.7)
Neutrophils Relative %: 70 %
Platelets: 197 10*3/uL (ref 150–400)
RBC: 3.53 MIL/uL — ABNORMAL LOW (ref 4.22–5.81)
RDW: 14.2 % (ref 11.5–15.5)
WBC: 6.5 10*3/uL (ref 4.0–10.5)
nRBC: 0 % (ref 0.0–0.2)

## 2022-11-20 LAB — BASIC METABOLIC PANEL
Anion gap: 7 (ref 5–15)
BUN: 16 mg/dL (ref 8–23)
CO2: 25 mmol/L (ref 22–32)
Calcium: 8.8 mg/dL — ABNORMAL LOW (ref 8.9–10.3)
Chloride: 107 mmol/L (ref 98–111)
Creatinine, Ser: 0.81 mg/dL (ref 0.61–1.24)
GFR, Estimated: >60 mL/min (ref >60)
Glucose, Bld: 93 mg/dL (ref 70–99)
Potassium: 3.7 mmol/L (ref 3.5–5.1)
Sodium: 139 mmol/L (ref 135–145)

## 2022-11-20 LAB — RETICULOCYTES
Immature Retic Fract: 19.9 % — ABNORMAL HIGH (ref 2.3–15.9)
RBC.: 3.54 MIL/uL — ABNORMAL LOW (ref 4.22–5.81)
Retic Count, Absolute: 35.4 10*3/uL (ref 19.0–186.0)
Retic Ct Pct: 1 % (ref 0.4–3.1)

## 2022-11-20 LAB — FOLATE: Folate: 7.7 ng/mL (ref >5.9)

## 2022-11-20 LAB — MAGNESIUM: Magnesium: 1.9 mg/dL (ref 1.7–2.4)

## 2022-11-20 LAB — IRON AND TIBC
Iron: 40 ug/dL — ABNORMAL LOW (ref 45–182)
Saturation Ratios: 18 % (ref 17.9–39.5)
TIBC: 224 ug/dL — ABNORMAL LOW (ref 250–450)
UIBC: 184 ug/dL

## 2022-11-20 LAB — FERRITIN: Ferritin: 71 ng/mL (ref 24–336)

## 2022-11-20 LAB — VITAMIN B12: Vitamin B-12: 1153 pg/mL — ABNORMAL HIGH (ref 180–914)

## 2022-11-20 LAB — PHOSPHORUS: Phosphorus: 2.1 mg/dL — ABNORMAL LOW (ref 2.5–4.6)

## 2022-11-20 MED ORDER — PENICILLIN G POTASSIUM 20000000 UNITS IJ SOLR
12.0000 10*6.[IU] | Freq: Two times a day (BID) | INTRAVENOUS | Status: DC
  Administered 2022-11-20 – 2022-11-21 (×2): 12 10*6.[IU] via INTRAVENOUS
  Filled 2022-11-20 (×4): qty 12

## 2022-11-20 MED ORDER — ATENOLOL 25 MG PO TABS
25.0000 mg | ORAL_TABLET | Freq: Every day | ORAL | Status: DC
  Administered 2022-11-21 – 2022-11-23 (×3): 25 mg via ORAL
  Filled 2022-11-20 (×4): qty 1

## 2022-11-20 NOTE — Consult Note (Signed)
[facility]  Total days of antibiotics 5       Reason for Consult: group b strep bacteremia secondary to right leg cellulitis    Referring Physician: David Stall  Principal Problem:   Septic shock (HCC) Active Problems:   Pressure injury of skin    HPI: Harold [name] is a 88 y.o. 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 He reports that he had some bleeding the week prior to hospitalization which may be portal of entry. At the outside hospital, he was febrile up to 101.57F, some hypotension requiring IVF and pressors, wbc of 12K with left shift. He was found to have group b strep on blood cx. Initially, he was placed on vancomycin and cefepime. Patient on exam, reports increased LN in size in 2 areas -anterior cervical chain as well as right inguinal region. Has hx of mesh inguinal hernia repair. Imaging of his leg revealed bulky adenopathy with possible central necrosis concerning for recurrence of lymphoma. The patient was last seen by dr Candise Che in march 2024. Patient transferred down to [facility] for eval and treatment. Patient was still on vasopressors overnight and received further IVF. This morning he thinks the size of leg is still increased above his baseline but not erythematous, no fevers, no longer on pressors. He was transitioned to ceftriaxone. Leukocytosis improving down to 6.5.  Past Medical History:  Diagnosis Date   BPH (benign prostatic hypertrophy)    CAD (coronary artery disease) 01/2010   S/P drug eluting stents LAD and PL branch of RCA Dr. 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   follicular lymphoma dx'd 06/2017   Hernia    HLD (hyperlipidemia)    HTN (hypertension)     Allergies: No Known  Allergies   MEDICATIONS:  aspirin EC  81 mg Oral Daily   atenolol  25 mg Oral Daily   brimonidine  1 drop Right Eye BID   enoxaparin (LOVENOX) injection  40 mg Subcutaneous Q24H   pravastatin  40 mg Oral QHS    Social History   Tobacco Use   Smoking status: Never   Smokeless tobacco: Never  Vaping Use   Vaping status: Never Used  Substance Use Topics   Alcohol use: No   Drug use: No    Family History  Problem Relation Age of Onset   Heart failure Mother     Review of Systems -  Review of Systems  Constitutional: Negative for fever, chills, diaphoresis, activity change, appetite change, fatigue and unexpected weight change.  HENT: Negative for congestion, sore throat, rhinorrhea, sneezing, trouble swallowing and sinus pressure.  Eyes: Negative for photophobia and visual disturbance.  Respiratory: Negative for cough, chest tightness, shortness of breath, wheezing and stridor.  Cardiovascular: Negative for chest pain, palpitations and leg swelling.  Gastrointestinal: Negative for nausea, vomiting, abdominal pain, diarrhea, constipation, blood in stool, abdominal distention and anal bleeding.  Genitourinary: Negative for dysuria, hematuria, flank pain and difficulty urinating.  Musculoskeletal: Negative for myalgias, back pain, joint swelling, arthralgias and gait problem.  Skin: +redness and swelling to right leg Neurological: Negative for dizziness, tremors, weakness and light-headedness.  Hematological: Negative for adenopathy. Does not bruise/bleed easily.  Psychiatric/Behavioral: Negative for behavioral problems, confusion,  sleep disturbance, dysphoric mood, decreased concentration and agitation.    OBJECTIVE: Temp:  [98 F (36.7 C)-98.8 F (37.1 C)] 98 F (36.7 C) (09/04 0516) Pulse Rate:  [60-95] 84 (09/04 0516) Resp:  [11-20] 19 (09/04 0516) BP: (110-139)/(38-71) 117/62 (09/04 0516) SpO2:  [98 %-100 %] 99 % (09/04 0516) Weight:  [77.1 kg-77.3 kg] 77.1 kg  (09/04 0500) Physical Exam  Constitutional: He is oriented to person, place, and time. He appears well-developed and well-nourished. No distress.  HENT:  Mouth/Throat: Oropharynx is clear and moist. No oropharyngeal exudate.  Cardiovascular: Normal rate, regular rhythm and normal heart sounds. Exam reveals no gallop and no friction rub.  No murmur heard.  Pulmonary/Chest: Effort normal and breath sounds normal. No respiratory distress. He has no wheezes.  Abdominal: Soft. Bowel sounds are normal. He exhibits no distension. There is no tenderness.  Lymphadenopathy:  +palpable anterior cervical chain LN to right side. Bulky LN to right inguinal roughly 15cm. ZOX:WRUEA leg marked increase in size with +pitting edema Neurological: He is alert and oriented to person, place, and time.  Skin: Skin is warm and dry. No rash noted. No erythema.  Psychiatric: He has a normal mood and affect. His behavior is normal.    LABS: Results for orders placed or performed during the hospital encounter of 11/17/22 (from the past 48 hour(s))  Basic metabolic panel     Status: Abnormal   Collection Time: 11/19/22  2:59 AM  Result Value Ref Range   Sodium 140 135 - 145 mmol/L   Potassium 3.9 3.5 - 5.1 mmol/L   Chloride 109 98 - 111 mmol/L   CO2 25 22 - 32 mmol/L   Glucose, Bld 99 70 - 99 mg/dL    Comment: Glucose reference range applies only to samples taken after fasting for at least 8 hours.   BUN 19 8 - 23 mg/dL   Creatinine, Ser 5.40 0.61 - 1.24 mg/dL   Calcium 8.4 (L) 8.9 - 10.3 mg/dL   GFR, Estimated >98 >11 mL/min    Comment: (NOTE) Calculated using the CKD-EPI Creatinine Equation (2021)    Anion gap 6 5 - 15    Comment: Performed at [facility], [address], [place], [place] [address]  CBC with Differential/Platelet     Status: Abnormal   Collection Time: 11/19/22  2:59 AM  Result Value Ref Range   WBC 7.6 4.0 - 10.5 K/uL   RBC 3.51 (L) 4.22 - 5.81 MIL/uL   Hemoglobin  10.0 (L) 13.0 - 17.0 g/dL   HCT 29.5 (L) 62.1 - 30.8 %   MCV 90.3 80.0 - 100.0 fL   MCH 28.5 26.0 - 34.0 pg   MCHC 31.5 30.0 - 36.0 g/dL   RDW 65.7 84.6 - 96.2 %   Platelets 175 150 - 400 K/uL   nRBC 0.0 0.0 - 0.2 %   Neutrophils Relative % 75 %   Neutro Abs 5.7 1.7 - 7.7 K/uL   Lymphocytes Relative 13 %   Lymphs Abs 1.0 0.7 - 4.0 K/uL   Monocytes Relative 9 %   Monocytes Absolute 0.7 0.1 - 1.0 K/uL   Eosinophils Relative 2 %   Eosinophils Absolute 0.2 0.0 - 0.5 K/uL   Basophils Relative 0 %   Basophils Absolute 0.0 0.0 - 0.1 K/uL   Immature Granulocytes 1 %   Abs Immature Granulocytes 0.04 0.00 - 0.07 K/uL    Comment: Performed at [facility], [address] [place], [place]  [address]  CBC with Differential/Platelet     Status: Abnormal   Collection Time: 11/20/22  4:13 AM  Result Value Ref Range   WBC 6.5 4.0 - 10.5 K/uL   RBC 3.53 (L) 4.22 - 5.81 MIL/uL   Hemoglobin 10.0 (L) 13.0 - 17.0 g/dL   HCT 64.4 (L) 03.4 - 74.2 %   MCV 90.1 80.0 - 100.0 fL   MCH 28.3 26.0 - 34.0 pg   MCHC 31.4 30.0 - 36.0 g/dL   RDW 59.5 63.8 - 75.6 %   Platelets 197 150 - 400 K/uL   nRBC 0.0 0.0 - 0.2 %   Neutrophils Relative % 70 %   Neutro Abs 4.5 1.7 - 7.7 K/uL   Lymphocytes Relative 15 %   Lymphs Abs 1.0 0.7 - 4.0 K/uL   Monocytes Relative 10 %   Monocytes Absolute 0.6 0.1 - 1.0 K/uL   Eosinophils Relative 4 %   Eosinophils Absolute 0.3 0.0 - 0.5 K/uL   Basophils Relative 0 %   Basophils Absolute 0.0 0.0 - 0.1 K/uL   Immature Granulocytes 1 %   Abs Immature Granulocytes 0.07 0.00 - 0.07 K/uL    Comment: Performed at [facility], [address] [place], [place] [address]  Basic metabolic panel     Status: Abnormal   Collection Time: 11/20/22  4:13 AM  Result Value Ref Range   Sodium 139 135 - 145 mmol/L   Potassium 3.7 3.5 - 5.1 mmol/L   Chloride 107 98 - 111 mmol/L   CO2 25 22 - 32 mmol/L   Glucose, Bld 93 70 - 99 mg/dL    Comment: Glucose  reference range applies only to samples taken after fasting for at least 8 hours.   BUN 16 8 - 23 mg/dL   Creatinine, Ser 5.18 0.61 - 1.24 mg/dL   Calcium 8.8 (L) 8.9 - 10.3 mg/dL   GFR, Estimated >84 >16 mL/min    Comment: (NOTE) Calculated using the CKD-EPI Creatinine Equation (2021)    Anion gap 7 5 - 15    Comment: Performed at [facility], [address], [place], [place] [address]  Magnesium     Status: None   Collection Time: 11/20/22  4:13 AM  Result Value Ref Range   Magnesium 1.9 1.7 - 2.4 mg/dL    Comment: Performed at [facility], [address] [place], [place] [address]  Phosphorus     Status: Abnormal   Collection Time: 11/20/22  4:13 AM  Result Value Ref Range   Phosphorus 2.1 (L) 2.5 - 4.6 mg/dL    Comment: Performed at [facility], [address], [place], [place] [address]  Vitamin B12     Status: Abnormal   Collection Time: 11/20/22  4:13 AM  Result Value Ref Range   Vitamin B-12 1,153 (H) 180 - 914 pg/mL    Comment: (NOTE) This assay is not validated for testing neonatal or myeloproliferative syndrome specimens for Vitamin B12 levels. Performed at [facility], [address], [place], [place] [address]   Folate     Status: None   Collection Time: 11/20/22  4:13 AM  Result Value Ref Range   Folate 7.7 >5.9 ng/mL    Comment: Performed at [facility], [address], [place], [place] [address]  Iron and TIBC     Status: Abnormal   Collection Time: 11/20/22  4:13 AM  Result Value Ref Range  Iron 40 (L) 45 - 182 ug/dL   TIBC 161 (L) 096 - 045 ug/dL   Saturation Ratios 18 17.9 - 39.5 %   UIBC 184 ug/dL    Comment: Performed at [facility], [address], [place], [place] [address]  Ferritin     Status: None   Collection Time: 11/20/22  4:13 AM  Result Value Ref Range   Ferritin 71 24 - 336 ng/mL    Comment: Performed at [facility], [address], [place], [place] [address]  Reticulocytes     Status: Abnormal   Collection Time: 11/20/22  4:13 AM  Result Value Ref Range   Retic Ct Pct 1.0 0.4 - 3.1 %   RBC. 3.54 (L) 4.22 - 5.81 MIL/uL   Retic Count, Absolute 35.4 19.0 - 186.0 K/uL   Immature Retic Fract 19.9 (H) 2.3 - 15.9 %    Comment: Performed at [facility], [address], [place], [place] [address]    MICRO: ------------ IMAGING: CT CHEST ABDOMEN PELVIS W CONTRAST  Result Date: 11/20/2022 CLINICAL DATA:  88 year old male with history of follicular lymphoma. Evaluate for metastatic disease. * Tracking Code: BO * EXAM: CT CHEST, ABDOMEN, AND PELVIS WITH CONTRAST TECHNIQUE: Multidetector CT imaging of the chest, abdomen and pelvis was performed following the standard protocol during bolus administration of intravenous contrast. RADIATION DOSE REDUCTION: This exam was performed according to the departmental dose-optimization program which includes automated exposure control, adjustment of the mA and/or kV according to patient size and/or use of iterative reconstruction technique. CONTRAST:  OMNIPAQUE IOHEXOL 300 MG/ML  SOLN COMPARISON:  Multiple priors, most recently PET-CT 07/18/2021. FINDINGS: CT CHEST FINDINGS Cardiovascular: Heart size is normal. There is no significant pericardial fluid, thickening or pericardial calcification. There is aortic atherosclerosis, as well as atherosclerosis of the great vessels of the mediastinum and the coronary arteries, including calcified atherosclerotic plaque in the left main, left anterior descending, left circumflex and right coronary arteries. Thickening and calcification of the aortic valve. Mitral annular calcifications. Mediastinum/Nodes: While there is no definite enlarged hilar lymphadenopathy to correspond with the area of hypermetabolism on the prior PET-CT, there are numerous enlarged middle mediastinal lymph nodes on today's  examination, concerning for residual disease, largest of which measure up to 1.8 cm in short axis (axial image 30 of series 2). Esophagus is unremarkable in appearance. Enlarged right axillary lymph node (axial image 17 of series 2) measuring 1.4 cm in short axis. Lungs/Pleura: Small bilateral pleural effusions lying dependently. 4 mm right lower lobe pulmonary nodule (axial image 103 of series 4). No other larger more suspicious appearing pulmonary nodules or masses are noted. No confluent consolidative airspace disease. Patchy areas of ground-glass attenuation and mild septal thickening in the medial aspect of the right lower lobe, nonspecific, but likely of infectious or inflammatory etiology. Musculoskeletal: There are no aggressive appearing lytic or blastic lesions noted in the visualized portions of the skeleton. CT ABDOMEN PELVIS FINDINGS Hepatobiliary: No suspicious cystic or solid hepatic lesions. No intra or extrahepatic biliary ductal dilatation. Gallbladder is nearly completely decompressed, but otherwise unremarkable in appearance. Pancreas: Extensive coarse calcifications scattered throughout the pancreas, similar to prior studies, sequela of chronic pancreatitis. No discrete pancreatic mass. No pancreatic ductal dilatation. No pancreatic or peripancreatic fluid collections or inflammatory changes. Spleen: Unremarkable. Adrenals/Urinary Tract: Bilateral kidneys and adrenal glands are unremarkable in appearance. No hydroureteronephrosis. Tiny calcifications lying dependently in the urinary bladder, similar to the prior study, potentially  bladder calculi, although these may simply represent mural calcifications along the posterior wall of the urinary bladder. Stomach/Bowel: The appearance of the stomach is unremarkable. No pathologic dilatation of small bowel or colon. A few scattered colonic diverticuli are noted, without surrounding inflammatory changes to indicate an acute diverticulitis at this  time. The appendix is not confidently identified and may be surgically absent. Regardless, there are no inflammatory changes noted adjacent to the cecum to suggest the presence of an acute appendicitis at this time. Vascular/Lymphatic: Atherosclerosis throughout the abdominal aorta and pelvic vasculature. Extensive lymphadenopathy noted throughout the abdomen and pelvis, indicative of recurrent disease. Specific examples include retroperitoneal lymph nodes measuring up to 1.7 cm in short axis in the left para-aortic nodal station (axial image 79 of series 2), extensive lymphadenopathy along the pelvic sidewalls bilaterally, largest of which in the right obturator nodal station measuring 2 cm in short axis, and extensive bilateral inguinal lymphadenopathy (right-greater-than-left), most notable for a irregular-shaped nodal mass on the right (axial image 117 of series 2) measuring 4.8 x 7.2 cm. Reproductive: Prostate gland and seminal vesicles are unremarkable in appearance. Other: No significant volume of ascites.  No pneumoperitoneum. Musculoskeletal: There are no aggressive appearing lytic or blastic lesions noted in the visualized portions of the skeleton. IMPRESSION: 1. Extensive lymphadenopathy noted throughout the chest, abdomen and pelvis, as detailed above, indicative of recurrent disease. 2. Small bilateral pleural effusions lying dependently. 3. 4 mm right lower lobe pulmonary nodule, nonspecific, but statistically likely benign. No follow-up needed if patient is low-risk.This recommendation follows the consensus statement: Guidelines for Management of Incidental Pulmonary Nodules Detected on CT Images: From the Fleischner Society 2017; Radiology 2017; 284:228-243. 4. Calcifications along the posterior wall of the urinary bladder, similar to the prior study. These may represent bladder calculi, but may represent mural calcifications in the wall of the urinary bladder. 5. Colonic diverticulosis without  evidence of acute diverticulitis at this time. 6. Additional incidental findings, as above. Electronically Signed   By: Trudie Reed M.D.   On: 11/20/2022 08:26   ECHOCARDIOGRAM COMPLETE  Result Date: 11/19/2022    ECHOCARDIOGRAM REPORT   Patient Name:   TYLON MACIOCE Date of Exam: 11/19/2022 Medical Rec #:  [id number]      Height:       69.5 in Accession #:    [id number]     Weight:       169.3 lb Date of Birth:  1934-05-29      BSA:          1.934 m Patient Age:    88 years       BP:           119/43 mmHg Patient Gender: M              HR:           79 bpm. Exam Location:  Inpatient Procedure: 2D Echo, Cardiac Doppler and Color Doppler Indications:    Bacteremia R78.81  History:        Patient has prior history of Echocardiogram examinations, most                 recent 04/16/2018. CAD; Risk Factors:Hypertension and                 Dyslipidemia.  Sonographer:    Harriette Bouillon RDCS Referring Phys: Val Riles SOOD IMPRESSIONS  1. Left ventricular ejection fraction, by estimation, is 60 to 65%. The left ventricle has normal function. The  left ventricle has no regional wall motion abnormalities. Left ventricular diastolic parameters are consistent with Grade II diastolic dysfunction (pseudonormalization). Elevated left atrial pressure.  2. Right ventricular systolic function is normal. The right ventricular size is normal. There is normal pulmonary artery systolic pressure.  3. The mitral valve is degenerative. Mild mitral valve regurgitation. Mild mitral stenosis. The mean mitral valve gradient is 5.3 mmHg with average heart rate of 77 bpm. Moderate to severe mitral annular calcification.  4. The aortic valve is tricuspid. There is moderate calcification of the aortic valve. There is moderate thickening of the aortic valve. Aortic valve regurgitation is not visualized. Aortic valve sclerosis/calcification is present, without any evidence of aortic stenosis. Comparison(s): The left ventricular diastolic function  is significantly worse. Conclusion(s)/Recommendation(s): No evidence of valvular vegetations on this transthoracic echocardiogram. Consider a transesophageal echocardiogram to exclude infective endocarditis if clinically indicated. FINDINGS  Left Ventricle: Left ventricular ejection fraction, by estimation, is 60 to 65%. The left ventricle has normal function. The left ventricle has no regional wall motion abnormalities. The left ventricular internal cavity size was normal in size. There is  no left ventricular hypertrophy. Left ventricular diastolic parameters are consistent with Grade II diastolic dysfunction (pseudonormalization). Elevated left atrial pressure. Right Ventricle: The right ventricular size is normal. No increase in right ventricular wall thickness. Right ventricular systolic function is normal. There is normal pulmonary artery systolic pressure. The tricuspid regurgitant velocity is 2.80 m/s, and  with an assumed right atrial pressure of 3 mmHg, the estimated right ventricular systolic pressure is 34.4 mmHg. Left Atrium: Left atrial size was normal in size. Right Atrium: Right atrial size was normal in size. Pericardium: There is no evidence of pericardial effusion. Mitral Valve: The mitral valve is degenerative in appearance. Moderate to severe mitral annular calcification. Mild mitral valve regurgitation. Mild mitral valve stenosis. The mean mitral valve gradient is 5.3 mmHg with average heart rate of 77 bpm. Tricuspid Valve: The tricuspid valve is normal in structure. Tricuspid valve regurgitation is mild. Aortic Valve: The aortic valve is tricuspid. There is moderate calcification of the aortic valve. There is moderate thickening of the aortic valve. Aortic valve regurgitation is not visualized. Aortic valve sclerosis/calcification is present, without any  evidence of aortic stenosis. Pulmonic Valve: The pulmonic valve was not well visualized. Pulmonic valve regurgitation is not visualized. No  evidence of pulmonic stenosis. Aorta: The aortic root and ascending aorta are structurally normal, with no evidence of dilitation. IAS/Shunts: No atrial level shunt detected by color flow Doppler.  LEFT VENTRICLE PLAX 2D LVIDd:         4.20 cm   Diastology LVIDs:         2.70 cm   LV e' medial:    6.74 cm/s LV PW:         0.90 cm   LV E/e' medial:  20.3 LV IVS:        0.90 cm   LV e' lateral:   9.79 cm/s LVOT diam:     2.10 cm   LV E/e' lateral: 14.0 LV SV:         86 LV SV Index:   44 LVOT Area:     3.46 cm  RIGHT VENTRICLE             IVC RV S prime:     17.00 cm/s  IVC diam: 1.10 cm TAPSE (M-mode): 3.0 cm LEFT ATRIUM             Index  RIGHT ATRIUM           Index LA diam:        4.00 cm 2.07 cm/m   RA Area:     16.90 cm LA Vol (A2C):   35.4 ml 18.30 ml/m  RA Volume:   44.20 ml  22.85 ml/m LA Vol (A4C):   41.7 ml 21.56 ml/m LA Biplane Vol: 38.5 ml 19.90 ml/m  AORTIC VALVE LVOT Vmax:   111.00 cm/s LVOT Vmean:  81.700 cm/s LVOT VTI:    0.247 m  AORTA Ao Root diam: 3.40 cm MITRAL VALVE                TRICUSPID VALVE MV Area (PHT): 2.47 cm     TR Peak grad:   31.4 mmHg MV Area VTI:   2.04 cm     TR Vmax:        280.00 cm/s MV Mean grad:  5.3 mmHg MV VTI:        0.42 m       SHUNTS MV Decel Time: 308 msec     Systemic VTI:  0.25 m MV E velocity: 137.00 cm/s  Systemic Diam: 2.10 cm MV A velocity: 139.00 cm/s MV E/A ratio:  0.99 Mihai Croitoru MD Electronically signed by Thurmon Fair MD Signature Date/Time: 11/19/2022/10:22:20 AM    Final     HISTORICAL MICRO/IMAGING  Assessment/Plan:  88yo M with group b strep bacteremia associated with right leg cellulitis. Has systolic murmur on exam.   - recommend to get repeat blood cx - change abtx to continuous PCN for now. If not high grade bacteremia, then can consider oral abtx after repeat blood cx cleared. - will track outside micro culture to see if need to get TEE. - possible recurrence of lymphoma - defer to oncology if further work up

## 2022-11-20 NOTE — Progress Notes (Incomplete)
.  PCN

## 2022-11-20 NOTE — Plan of Care (Signed)
  Problem: Education: Goal: Knowledge of General Education information will improve Description: Including pain rating scale, medication(s)/side effects and non-pharmacologic comfort measures Outcome: Progressing   Problem: Clinical Measurements: Goal: Respiratory complications will improve Outcome: Progressing   Problem: Activity: Goal: Risk for activity intolerance will decrease Outcome: Progressing   Problem: Coping: Goal: Level of anxiety will decrease Outcome: Progressing   Problem: Elimination: Goal: Will not experience complications related to bowel motility Outcome: Progressing Goal: Will not experience complications related to urinary retention Outcome: Progressing

## 2022-11-20 NOTE — Progress Notes (Addendum)
[facility] PROGRESS NOTE    Progress Note  Harold [name]  ZOX:[id number] DOB: 03/16/1935 DOA: 11/17/2022 PCP: Barron Alvine, MD     Brief Narrative:   Harold [name] is an 88 y.o. male past medical history of essential hypertension, non-Hodgkin's lymphoma, MI DVT not on anticoagulation, chronic diastolic heart failure presented to [facility] long hospital to the ICU from [facility] for septic shock secondary to streptococcal bacteremia from right lower extremity cellulitis she was fluid resuscitated started on antibiotics and required pressors transferred to [facility] on 11/20/2022  Significant events: 9/1: admitted to [facility] ICU from [facility] on [place]. +GBS on blood cultures  9/2: pressors stopped  9/3: WBC continue to improve, no fevers. Assessment without evidence of cellulitis. BP soft but maintaining MAP without pressor support for ~24 hours.   Assessment/Plan:   Septic shock (HCC) secondary to group B streptococcus bacteremia in the setting of right lower extremity cellulitis: 1 out of 2 blood cultures grew GBS started empirically on antibiotics required pressors for 24 hours. Has defervesced leukocytosis improved. Currently on IV Rocephin. 2D echo done showed an EF of 60% grade 2 diastolic dysfunction mild mitral regurgitation and mild mitral stenosis. Surveillance blood cultures done on 11/20/2022  Right inguinal bulky adenopathy with compression of the right lower extremity venous system/low-grade follicular lymphoma: Has a follow-up appointment with Dr. Candise Che on 11/25/2022.  Dr. Candise Che has evaluated his right lower extremity pelvic region lymphadenopathy which is unchanged from the last time he is on. Dr. Candise Che recommended a CT of chest abdomen and pelvis that showed extensive lymphadenopathy throughout the chest abdomen and pelvis indicative of recurrent disease, small bilateral pleural effusion 4 mm right lower lobe pulmonary nodule. Awaiting further  recommendations.  CAD status post stent: Assume atenolol continue to hold Lasix lisinopril and Isordil. Continue aspirin and pravastatin.  Essential hypertension/chronic diastolic heart failure: Continue atenolol, continue to hold Lasix lisinopril and Isordil. Her blood pressure is currently stable.  Normocytic anemia: Hemoglobin is relatively stable.  Stage II sacral decubitus ulcer present on admission: RN Pressure Injury Documentation: Pressure Injury 11/17/22 Buttocks Stage 2 -  Partial thickness loss of dermis presenting as a shallow open injury with a red, pink wound bed without slough. (Active)  11/17/22 1623  Location: Buttocks  Location Orientation:   Staging: Stage 2 -  Partial thickness loss of dermis presenting as a shallow open injury with a red, pink wound bed without slough.  Wound Description (Comments):   Present on Admission: Yes  Dressing Type Foam - Lift dressing to assess site every shift 11/19/22 1725     DVT prophylaxis: lovenox Family Communication:none Status is: Inpatient Remains inpatient appropriate because: Sepsis    Code Status:     Code Status Orders  (From admission, onward)           Start     Ordered   11/17/22 1647  Do not attempt resuscitation (DNR)- Limited -Do Not Intubate (DNI)  Continuous       Question Answer Comment  If pulseless and not breathing No CPR or chest compressions.   In Pre-Arrest Conditions (Patient Is Breathing and Has A Pulse) Do not intubate. Provide all appropriate non-invasive medical interventions. Avoid ICU transfer unless indicated or required.   Consent: Discussion documented in EHR or advanced directives reviewed      11/17/22 1659           Code Status History     This patient has a current code status  but no historical code status.      Advance Directive Documentation    Flowsheet Row Most Recent Value  Type of Advance Directive Healthcare Power of Attorney, Living will  Pre-existing  out of facility DNR order (yellow form or pink MOST form) --  "MOST" Form in Place? --         IV Access:   Peripheral IV   Procedures and diagnostic studies:   CT CHEST ABDOMEN PELVIS W CONTRAST  Result Date: 11/20/2022 CLINICAL DATA:  88 year old male with history of follicular lymphoma. Evaluate for metastatic disease. * Tracking Code: BO * EXAM: CT CHEST, ABDOMEN, AND PELVIS WITH CONTRAST TECHNIQUE: Multidetector CT imaging of the chest, abdomen and pelvis was performed following the standard protocol during bolus administration of intravenous contrast. RADIATION DOSE REDUCTION: This exam was performed according to the departmental dose-optimization program which includes automated exposure control, adjustment of the mA and/or kV according to patient size and/or use of iterative reconstruction technique. CONTRAST:  OMNIPAQUE IOHEXOL 300 MG/ML  SOLN COMPARISON:  Multiple priors, most recently PET-CT 07/18/2021. FINDINGS: CT CHEST FINDINGS Cardiovascular: Heart size is normal. There is no significant pericardial fluid, thickening or pericardial calcification. There is aortic atherosclerosis, as well as atherosclerosis of the great vessels of the mediastinum and the coronary arteries, including calcified atherosclerotic plaque in the left main, left anterior descending, left circumflex and right coronary arteries. Thickening and calcification of the aortic valve. Mitral annular calcifications. Mediastinum/Nodes: While there is no definite enlarged hilar lymphadenopathy to correspond with the area of hypermetabolism on the prior PET-CT, there are numerous enlarged middle mediastinal lymph nodes on today's examination, concerning for residual disease, largest of which measure up to 1.8 cm in short axis (axial image 30 of series 2). Esophagus is unremarkable in appearance. Enlarged right axillary lymph node (axial image 17 of series 2) measuring 1.4 cm in short axis. Lungs/Pleura: Small bilateral  pleural effusions lying dependently. 4 mm right lower lobe pulmonary nodule (axial image 103 of series 4). No other larger more suspicious appearing pulmonary nodules or masses are noted. No confluent consolidative airspace disease. Patchy areas of ground-glass attenuation and mild septal thickening in the medial aspect of the right lower lobe, nonspecific, but likely of infectious or inflammatory etiology. Musculoskeletal: There are no aggressive appearing lytic or blastic lesions noted in the visualized portions of the skeleton. CT ABDOMEN PELVIS FINDINGS Hepatobiliary: No suspicious cystic or solid hepatic lesions. No intra or extrahepatic biliary ductal dilatation. Gallbladder is nearly completely decompressed, but otherwise unremarkable in appearance. Pancreas: Extensive coarse calcifications scattered throughout the pancreas, similar to prior studies, sequela of chronic pancreatitis. No discrete pancreatic mass. No pancreatic ductal dilatation. No pancreatic or peripancreatic fluid collections or inflammatory changes. Spleen: Unremarkable. Adrenals/Urinary Tract: Bilateral kidneys and adrenal glands are unremarkable in appearance. No hydroureteronephrosis. Tiny calcifications lying dependently in the urinary bladder, similar to the prior study, potentially bladder calculi, although these may simply represent mural calcifications along the posterior wall of the urinary bladder. Stomach/Bowel: The appearance of the stomach is unremarkable. No pathologic dilatation of small bowel or colon. A few scattered colonic diverticuli are noted, without surrounding inflammatory changes to indicate an acute diverticulitis at this time. The appendix is not confidently identified and may be surgically absent. Regardless, there are no inflammatory changes noted adjacent to the cecum to suggest the presence of an acute appendicitis at this time. Vascular/Lymphatic: Atherosclerosis throughout the abdominal aorta and pelvic  vasculature. Extensive lymphadenopathy noted throughout the abdomen and pelvis,  indicative of recurrent disease. Specific examples include retroperitoneal lymph nodes measuring up to 1.7 cm in short axis in the left para-aortic nodal station (axial image 79 of series 2), extensive lymphadenopathy along the pelvic sidewalls bilaterally, largest of which in the right obturator nodal station measuring 2 cm in short axis, and extensive bilateral inguinal lymphadenopathy (right-greater-than-left), most notable for a irregular-shaped nodal mass on the right (axial image 117 of series 2) measuring 4.8 x 7.2 cm. Reproductive: Prostate gland and seminal vesicles are unremarkable in appearance. Other: No significant volume of ascites.  No pneumoperitoneum. Musculoskeletal: There are no aggressive appearing lytic or blastic lesions noted in the visualized portions of the skeleton. IMPRESSION: 1. Extensive lymphadenopathy noted throughout the chest, abdomen and pelvis, as detailed above, indicative of recurrent disease. 2. Small bilateral pleural effusions lying dependently. 3. 4 mm right lower lobe pulmonary nodule, nonspecific, but statistically likely benign. No follow-up needed if patient is low-risk.This recommendation follows the consensus statement: Guidelines for Management of Incidental Pulmonary Nodules Detected on CT Images: From the Fleischner Society 2017; Radiology 2017; 284:228-243. 4. Calcifications along the posterior wall of the urinary bladder, similar to the prior study. These may represent bladder calculi, but may represent mural calcifications in the wall of the urinary bladder. 5. Colonic diverticulosis without evidence of acute diverticulitis at this time. 6. Additional incidental findings, as above. Electronically Signed   By: Trudie Reed M.D.   On: 11/20/2022 08:26   ECHOCARDIOGRAM COMPLETE  Result Date: 11/19/2022    ECHOCARDIOGRAM REPORT   Patient Name:   Harold [name] Date of Exam: 11/19/2022  Medical Rec #:  [id number]      Height:       69.5 in Accession #:    [id number]     Weight:       169.3 lb Date of Birth:  01-15-1935      BSA:          1.934 m Patient Age:    88 years       BP:           119/43 mmHg Patient Gender: M              HR:           79 bpm. Exam Location:  Inpatient Procedure: 2D Echo, Cardiac Doppler and Color Doppler Indications:    Bacteremia R78.81  History:        Patient has prior history of Echocardiogram examinations, most                 recent 04/16/2018. CAD; Risk Factors:Hypertension and                 Dyslipidemia.  Sonographer:    Harriette Bouillon RDCS Referring Phys: Val Riles SOOD IMPRESSIONS  1. Left ventricular ejection fraction, by estimation, is 60 to 65%. The left ventricle has normal function. The left ventricle has no regional wall motion abnormalities. Left ventricular diastolic parameters are consistent with Grade II diastolic dysfunction (pseudonormalization). Elevated left atrial pressure.  2. Right ventricular systolic function is normal. The right ventricular size is normal. There is normal pulmonary artery systolic pressure.  3. The mitral valve is degenerative. Mild mitral valve regurgitation. Mild mitral stenosis. The mean mitral valve gradient is 5.3 mmHg with average heart rate of 77 bpm. Moderate to severe mitral annular calcification.  4. The aortic valve is tricuspid. There is moderate calcification of the aortic valve. There is moderate thickening of the aortic valve.  Aortic valve regurgitation is not visualized. Aortic valve sclerosis/calcification is present, without any evidence of aortic stenosis. Comparison(s): The left ventricular diastolic function is significantly worse. Conclusion(s)/Recommendation(s): No evidence of valvular vegetations on this transthoracic echocardiogram. Consider a transesophageal echocardiogram to exclude infective endocarditis if clinically indicated. FINDINGS  Left Ventricle: Left ventricular ejection fraction, by  estimation, is 60 to 65%. The left ventricle has normal function. The left ventricle has no regional wall motion abnormalities. The left ventricular internal cavity size was normal in size. There is  no left ventricular hypertrophy. Left ventricular diastolic parameters are consistent with Grade II diastolic dysfunction (pseudonormalization). Elevated left atrial pressure. Right Ventricle: The right ventricular size is normal. No increase in right ventricular wall thickness. Right ventricular systolic function is normal. There is normal pulmonary artery systolic pressure. The tricuspid regurgitant velocity is 2.80 m/s, and  with an assumed right atrial pressure of 3 mmHg, the estimated right ventricular systolic pressure is 34.4 mmHg. Left Atrium: Left atrial size was normal in size. Right Atrium: Right atrial size was normal in size. Pericardium: There is no evidence of pericardial effusion. Mitral Valve: The mitral valve is degenerative in appearance. Moderate to severe mitral annular calcification. Mild mitral valve regurgitation. Mild mitral valve stenosis. The mean mitral valve gradient is 5.3 mmHg with average heart rate of 77 bpm. Tricuspid Valve: The tricuspid valve is normal in structure. Tricuspid valve regurgitation is mild. Aortic Valve: The aortic valve is tricuspid. There is moderate calcification of the aortic valve. There is moderate thickening of the aortic valve. Aortic valve regurgitation is not visualized. Aortic valve sclerosis/calcification is present, without any  evidence of aortic stenosis. Pulmonic Valve: The pulmonic valve was not well visualized. Pulmonic valve regurgitation is not visualized. No evidence of pulmonic stenosis. Aorta: The aortic root and ascending aorta are structurally normal, with no evidence of dilitation. IAS/Shunts: No atrial level shunt detected by color flow Doppler.  LEFT VENTRICLE PLAX 2D LVIDd:         4.20 cm   Diastology LVIDs:         2.70 cm   LV e' medial:     6.74 cm/s LV PW:         0.90 cm   LV E/e' medial:  20.3 LV IVS:        0.90 cm   LV e' lateral:   9.79 cm/s LVOT diam:     2.10 cm   LV E/e' lateral: 14.0 LV SV:         86 LV SV Index:   44 LVOT Area:     3.46 cm  RIGHT VENTRICLE             IVC RV S prime:     17.00 cm/s  IVC diam: 1.10 cm TAPSE (M-mode): 3.0 cm LEFT ATRIUM             Index        RIGHT ATRIUM           Index LA diam:        4.00 cm 2.07 cm/m   RA Area:     16.90 cm LA Vol (A2C):   35.4 ml 18.30 ml/m  RA Volume:   44.20 ml  22.85 ml/m LA Vol (A4C):   41.7 ml 21.56 ml/m LA Biplane Vol: 38.5 ml 19.90 ml/m  AORTIC VALVE LVOT Vmax:   111.00 cm/s LVOT Vmean:  81.700 cm/s LVOT VTI:    0.247 m  AORTA Ao Root  diam: 3.40 cm MITRAL VALVE                TRICUSPID VALVE MV Area (PHT): 2.47 cm     TR Peak grad:   31.4 mmHg MV Area VTI:   2.04 cm     TR Vmax:        280.00 cm/s MV Mean grad:  5.3 mmHg MV VTI:        0.42 m       SHUNTS MV Decel Time: 308 msec     Systemic VTI:  0.25 m MV E velocity: 137.00 cm/s  Systemic Diam: 2.10 cm MV A velocity: 139.00 cm/s MV E/A ratio:  0.99 Mihai Croitoru MD Electronically signed by Thurmon Fair MD Signature Date/Time: 11/19/2022/10:22:20 AM    Final      Medical Consultants:   None.   Subjective:    Beverly Gust Harrow relates he feels better than when he came in appetite has improved.  Objective:    Vitals:   11/19/22 1726 11/19/22 2159 11/20/22 0500 11/20/22 0516  BP: 139/71 (!) 110/52  117/62  Pulse: 95 71  84  Resp: 20 14  19   Temp: 98.5 F (36.9 C) 98.2 F (36.8 C)  98 F (36.7 C)  TempSrc: Oral Oral  Oral  SpO2: 99% 99%  99%  Weight: 77.3 kg  77.1 kg   Height: 5' 9.5" (1.765 m)      SpO2: 99 %   Intake/Output Summary (Last 24 hours) at 11/20/2022 0943 Last data filed at 11/20/2022 0936 Gross per 24 hour  Intake 568.31 ml  Output 900 ml  Net -331.69 ml   Filed Weights   11/19/22 0423 11/19/22 1726 11/20/22 0500  Weight: 76.8 kg 77.3 kg 77.1 kg    Exam: General exam:  In no acute distress. Respiratory system: Good air movement and clear to auscultation. Cardiovascular system: S1 & S2 heard, RRR. No JVD. Extremities: Right lower extremity cellulitis. Skin: No rashes, lesions or ulcers Psychiatry: Judgement and insight appear normal. Mood & affect appropriate.    Data Reviewed:    Labs: Basic Metabolic Panel: Recent Labs  Lab 11/18/22 0316 11/19/22 0259 11/20/22 0413  NA 136 140 139  K 3.4* 3.9 3.7  CL 106 109 107  CO2 23 25 25   GLUCOSE 127* 99 93  BUN 26* 19 16  CREATININE 0.96 0.86 0.81  CALCIUM 8.2* 8.4* 8.8*  MG 2.0  --  1.9  PHOS  --   --  2.1*   GFR Estimated Creatinine Clearance: 66.6 mL/min (by C-G formula based on SCr of 0.81 mg/dL). Liver Function Tests: No results for input(s): "AST", "ALT", "ALKPHOS", "BILITOT", "PROT", "ALBUMIN" in the last 168 hours. No results for input(s): "LIPASE", "AMYLASE" in the last 168 hours. No results for input(s): "AMMONIA" in the last 168 hours. Coagulation profile Recent Labs  Lab 11/17/22 1826  INR 1.4*   COVID-19 Labs  Recent Labs    11/20/22 0413  FERRITIN 71    Lab Results  Component Value Date   SARSCOV2NAA NEGATIVE 05/17/2019    CBC: Recent Labs  Lab 11/18/22 0316 11/19/22 0259 11/20/22 0413  WBC 15.9* 7.6 6.5  NEUTROABS 13.1* 5.7 4.5  HGB 10.3* 10.0* 10.0*  HCT 32.6* 31.7* 31.8*  MCV 91.8 90.3 90.1  PLT 183 175 197   Cardiac Enzymes: No results for input(s): "CKTOTAL", "CKMB", "CKMBINDEX", "TROPONINI" in the last 168 hours. BNP (last 3 results) No results for input(s): "PROBNP" in the last 8760  hours. CBG: No results for input(s): "GLUCAP" in the last 168 hours. D-Dimer: No results for input(s): "DDIMER" in the last 72 hours. Hgb A1c: No results for input(s): "HGBA1C" in the last 72 hours. Lipid Profile: No results for input(s): "CHOL", "HDL", "LDLCALC", "TRIG", "CHOLHDL", "LDLDIRECT" in the last 72 hours. Thyroid function studies: No results for  input(s): "TSH", "T4TOTAL", "T3FREE", "THYROIDAB" in the last 72 hours.  Invalid input(s): "FREET3" Anemia work up: Recent Labs    11/20/22 0413  VITAMINB12 1,153*  FOLATE 7.7  FERRITIN 71  TIBC 224*  IRON 40*  RETICCTPCT 1.0   Sepsis Labs: Recent Labs  Lab 11/18/22 0316 11/19/22 0259 11/20/22 0413  WBC 15.9* 7.6 6.5   Microbiology Recent Results (from the past 240 hour(s))  MRSA Next Gen by PCR, Nasal     Status: None   Collection Time: 11/17/22  4:14 PM   Specimen: Nasal Mucosa; Nasal Swab  Result Value Ref Range Status   MRSA by PCR Next Gen NOT DETECTED NOT DETECTED Final    Comment: (NOTE) The GeneXpert MRSA Assay (FDA approved for NASAL specimens only), is one component of a comprehensive MRSA colonization surveillance program. It is not intended to diagnose MRSA infection nor to guide or monitor treatment for MRSA infections. Test performance is not FDA approved in patients less than 32 years old. Performed at [facility], [address], [place], [place] [address]      Medications:    aspirin EC  81 mg Oral Daily   brimonidine  1 drop Right Eye BID   Chlorhexidine Gluconate Cloth  6 each Topical Daily   enoxaparin (LOVENOX) injection  40 mg Subcutaneous Q24H   pravastatin  40 mg Oral QHS   Continuous Infusions:  sodium chloride 10 mL/hr at 11/19/22 0400   cefTRIAXone (ROCEPHIN)  IV 2 g (11/19/22 2056)   norepinephrine (LEVOPHED) Adult infusion Stopped (11/18/22 1446)      LOS: 3 days   Marinda Elk  [facility]  11/20/2022, 9:43 AM

## 2022-11-20 NOTE — Consult Note (Incomplete)
HEMATOLOGY/ONCOLOGY CONSULTATION NOTE  Date of Service: 11/20/2022  Patient Care Team: Barron Alvine, MD as PCP - General (Family Medicine) Kathleene Hazel, MD as PCP - Cardiology (Cardiology)  CHIEF COMPLAINTS/PURPOSE OF CONSULTATION:  Evaluation and management of follicular lymphoma  HISTORY OF PRESENTING ILLNESS:  Harold [name] is a wonderful 88 y.o. 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  Patient reports endorsing a fever over the weekend with a temperature reading over 101. He complains of bilateral lower extremity swelling, R>L. There was increased redness and warmth in his right lower extremity. Patient also complains of chills. He denies any new respiratory symptoms or new diarrhea. Patient does note some mild diarrhea with consumption of certain foods.   He complains of an ingrown toenail in his right big toe with soreness especially on palpation. Patient also appears to have an ingrown toenail in his left big toe.   He presented with rigors and hypotension with blood cultures +ve for Grp B strept. Patient on antibiotics and received fluid resuscitation and pressors.  Today he notes the redness and swelling is improving and the rt lower extremities is improved.  He had a CT chest/abd/pelvis at our recommendation to evaluate extent of his FL and was noted to have generalized lymphadenopathy but more of the involvement with his low grade lymphoma is asymptomatic and non bulky. He does has sizable adenopathy in the rt inguinal/plevic region which appears to be causing some lymphedema and possible venous compression. No DVT on CT venogram.   MEDICAL HISTORY:  Past Medical History:  Diagnosis Date   BPH (benign prostatic hypertrophy)    CAD (coronary artery disease) 01/2010   S/P drug eluting stents LAD and PL branch of [facility] Dr. 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   follicular lymphoma dx'd 06/2017   Hernia    HLD (hyperlipidemia)    HTN (hypertension)     SURGICAL HISTORY: Past Surgical History:  Procedure Laterality Date   BACK SURGERY     CYSTOSCOPY WITH LITHOLAPAXY N/A 05/20/2019   Procedure: CYSTOSCOPY WITH LITHOLAPAXY;  Surgeon: Crist Fat, MD;  Location: [facility];  Service: Urology;  Laterality: N/A;   CYSTOSCOPY/URETEROSCOPY/HOLMIUM LASER/STENT PLACEMENT Left 05/20/2019   Procedure: CYSTOSCOPY/URETEROSCOPY/HOLMIUM LASER/STENT PLACEMENT;  Surgeon: Crist Fat, MD;  Location: [facility];  Service: Urology;  Laterality: Left;   HERNIA REPAIR  01/08/11   RIH   MASS EXCISION Right 08/05/2017   Procedure: EXCISION RIGHT POSTERIOR  NECK LYMPH NODE;  Surgeon: Drema Halon, MD;  Location: [facility];  Service: ENT;  Laterality: Right;    SOCIAL HISTORY: Social History   Socioeconomic History   Marital status: Married    Spouse name: Not on file   Number of children: Not on file   Years of education: Not on file   Highest education level: Not on file  Occupational History   Not on file  Tobacco Use   Smoking status: Never   Smokeless tobacco: Never  Vaping Use   Vaping status: Never Used  Substance and Sexual Activity   Alcohol use: No   Drug use: No   Sexual activity: Not on file  Other Topics Concern   Not on file  Social History Narrative   Married   Social Determinants of Health   Financial Resource Strain: Low Risk  (11/17/2022)   [facility]   Overall Financial Resource Strain (CARDIA)    Difficulty of Paying Living Expenses: Not very hard  Food Insecurity: No Food Insecurity (11/17/2022)   Hunger Vital Sign    Worried About Running Out of Food in the Last Year: Never true    Ran Out of Food in the Last Year: Never true  Transportation Needs: No Transportation Needs (11/17/2022)   PRAPARE - Administrator, Civil Service (Medical): No    Lack of  Transportation (Non-Medical): No  Physical Activity: Inactive (11/17/2022)   Received from [facility]   Exercise Vital Sign    Days of Exercise per Week: 0 days    Minutes of Exercise per Session: 0 min  Stress: Stress Concern Present (11/17/2022)   Received from [facility] - Occupational Stress Questionnaire    Feeling of Stress : Rather much  Social Connections: Moderately Isolated (11/17/2022)   Received from [facility]   Social Connection and Isolation Panel [NHANES]    Frequency of Communication with Friends and Family: More than three times a week    Frequency of Social Gatherings with Friends and Family: More than three times a week    Attends Religious Services: More than 4 times per year    Active Member of [employer] or Organizations: No    Attends Banker Meetings: Never    Marital Status: Widowed  Intimate Partner Violence: Not At Risk (11/17/2022)   Humiliation, Afraid, Rape, and Kick questionnaire    Fear of Current or Ex-Partner: No    Emotionally Abused: No    Physically Abused: No    Sexually Abused: No    FAMILY HISTORY: Family History  Problem Relation Age of Onset   Heart failure Mother     ALLERGIES:  has No Known Allergies.  MEDICATIONS:  Current Facility-Administered Medications  Medication Dose Route Frequency Provider Last Rate Last Admin   0.9 %  sodium chloride infusion  250 mL Intravenous Continuous Hunsucker, Lesia Sago, MD 10 mL/hr at 11/19/22 0400 Infusion Verify at 11/19/22 0400   acetaminophen (TYLENOL) tablet 650 mg  650 mg Oral Q6H PRN Hunsucker, Lesia Sago, MD       aspirin EC tablet 81 mg  81 mg Oral Daily Hunsucker, Lesia Sago, MD   81 mg at 11/20/22 0959   atenolol (TENORMIN) tablet 25 mg  25 mg Oral Daily Marinda Elk, MD       brimonidine (ALPHAGAN) 0.2 % ophthalmic solution 1 drop  1 drop Right Eye BID Hunsucker, Lesia Sago, MD   1 drop at 11/20/22 1000   cefTRIAXone  (ROCEPHIN) 2 g in sodium chloride 0.9 % 100 mL IVPB  2 g Intravenous Q24H Coralyn Helling, MD 200 mL/hr at 11/19/22 2056 2 g at 11/19/22 2056   docusate sodium (COLACE) capsule 100 mg  100 mg Oral BID PRN Hunsucker, Lesia Sago, MD       enoxaparin (LOVENOX) injection 40 mg  40 mg Subcutaneous Q24H Hunsucker, Lesia Sago, MD   40 mg at 11/19/22 1848   ipratropium-albuterol (DUONEB) 0.5-2.5 (3) MG/3ML nebulizer solution 3 mL  3 mL Nebulization Q6H PRN Hunsucker, Lesia Sago, MD       norepinephrine (LEVOPHED) 4mg  in (0.016 mg/mL) premix infusion  2-10 mcg/min Intravenous Titrated Hunsucker, Lesia Sago, MD   Stopped at 11/18/22 1446   ondansetron (ZOFRAN) injection 4 mg  4 mg Intravenous Q6H PRN Hunsucker, Lesia Sago,  MD       Oral care mouth rinse  15 mL Mouth Rinse PRN Hunsucker, Lesia Sago, MD       polyethylene glycol (MIRALAX / GLYCOLAX) packet 17 g  17 g Oral Daily PRN Hunsucker, Lesia Sago, MD       pravastatin (PRAVACHOL) tablet 40 mg  40 mg Oral QHS Hunsucker, Lesia Sago, MD   40 mg at 11/19/22 2100    REVIEW OF SYSTEMS:    10 Point review of Systems was done is negative except as noted above.  PHYSICAL EXAMINATION: ECOG PERFORMANCE STATUS: 3 - Symptomatic, >50% confined to bed  . Vitals:   11/19/22 2159 11/20/22 0516  BP: (!) 110/52 117/62  Pulse: 71 84  Resp: 14 19  Temp: 98.2 F (36.8 C) 98 F (36.7 C)  SpO2: 99% 99%   Filed Weights   11/19/22 0423 11/19/22 1726 11/20/22 0500  Weight: 76.8 kg 77.3 kg 77.1 kg   .Body mass index is 24.74 kg/m.  GENERAL:alert, in no acute distress and comfortable OROPHARYNX: MMM NECK: supple, no JVD LYMPH:  no palpable lymphadenopathy in the cervical, axillary or inguinal regions LUNGS: clear to auscultation b/l with normal respiratory effort HEART: regular rate & rhythm ABDOMEN:  normoactive bowel sounds , non tender, not distended. Extremity: b/l lower extremity swelling R>>L. Warmth and resolving erythema over the RLE. PSYCH: alert &  oriented x 3 with fluent speech NEURO: no focal motor/sensory deficits  LABORATORY DATA:  I have reviewed the data as listed  .    Latest Ref Rng & Units 11/20/2022    4:13 AM 11/19/2022    2:59 AM 11/18/2022    3:16 AM  CBC  WBC 4.0 - 10.5 K/uL 6.5  7.6  15.9   Hemoglobin 13.0 - 17.0 g/dL 13.0  86.5  78.4   Hematocrit 39.0 - 52.0 % 31.8  31.7  32.6   Platelets 150 - 400 K/uL 197  175  183     .    Latest Ref Rng & Units 11/20/2022    4:13 AM 11/19/2022    2:59 AM 11/18/2022    3:16 AM  CMP  Glucose 70 - 99 mg/dL 93  99  696   BUN 8 - 23 mg/dL 16  19  26    Creatinine 0.61 - 1.24 mg/dL 2.95  2.84  1.32   Sodium 135 - 145 mmol/L 139  140  136   Potassium 3.5 - 5.1 mmol/L 3.7  3.9  3.4   Chloride 98 - 111 mmol/L 107  109  106   CO2 22 - 32 mmol/L 25  25  23    Calcium 8.9 - 10.3 mg/dL 8.8  8.4  8.2      RADIOGRAPHIC STUDIES: I have personally reviewed the radiological images as listed and agreed with the findings in the report. CT CHEST ABDOMEN PELVIS W CONTRAST  Result Date: 11/20/2022 CLINICAL DATA:  88 year old male with history of follicular lymphoma. Evaluate for metastatic disease. * Tracking Code: BO * EXAM: CT CHEST, ABDOMEN, AND PELVIS WITH CONTRAST TECHNIQUE: Multidetector CT imaging of the chest, abdomen and pelvis was performed following the standard protocol during bolus administration of intravenous contrast. RADIATION DOSE REDUCTION: This exam was performed according to the departmental dose-optimization program which includes automated exposure control, adjustment of the mA and/or kV according to patient size and/or use of iterative reconstruction technique. CONTRAST:  OMNIPAQUE IOHEXOL 300 MG/ML  SOLN COMPARISON:  Multiple priors, most recently PET-CT 07/18/2021. FINDINGS:  CT CHEST FINDINGS Cardiovascular: Heart size is normal. There is no significant pericardial fluid, thickening or pericardial calcification. There is aortic atherosclerosis, as well as atherosclerosis  of the great vessels of the mediastinum and the coronary arteries, including calcified atherosclerotic plaque in the left main, left anterior descending, left circumflex and right coronary arteries. Thickening and calcification of the aortic valve. Mitral annular calcifications. Mediastinum/Nodes: While there is no definite enlarged hilar lymphadenopathy to correspond with the area of hypermetabolism on the prior PET-CT, there are numerous enlarged middle mediastinal lymph nodes on today's examination, concerning for residual disease, largest of which measure up to 1.8 cm in short axis (axial image 30 of series 2). Esophagus is unremarkable in appearance. Enlarged right axillary lymph node (axial image 17 of series 2) measuring 1.4 cm in short axis. Lungs/Pleura: Small bilateral pleural effusions lying dependently. 4 mm right lower lobe pulmonary nodule (axial image 103 of series 4). No other larger more suspicious appearing pulmonary nodules or masses are noted. No confluent consolidative airspace disease. Patchy areas of ground-glass attenuation and mild septal thickening in the medial aspect of the right lower lobe, nonspecific, but likely of infectious or inflammatory etiology. Musculoskeletal: There are no aggressive appearing lytic or blastic lesions noted in the visualized portions of the skeleton. CT ABDOMEN PELVIS FINDINGS Hepatobiliary: No suspicious cystic or solid hepatic lesions. No intra or extrahepatic biliary ductal dilatation. Gallbladder is nearly completely decompressed, but otherwise unremarkable in appearance. Pancreas: Extensive coarse calcifications scattered throughout the pancreas, similar to prior studies, sequela of chronic pancreatitis. No discrete pancreatic mass. No pancreatic ductal dilatation. No pancreatic or peripancreatic fluid collections or inflammatory changes. Spleen: Unremarkable. Adrenals/Urinary Tract: Bilateral kidneys and adrenal glands are unremarkable in appearance. No  hydroureteronephrosis. Tiny calcifications lying dependently in the urinary bladder, similar to the prior study, potentially bladder calculi, although these may simply represent mural calcifications along the posterior wall of the urinary bladder. Stomach/Bowel: The appearance of the stomach is unremarkable. No pathologic dilatation of small bowel or colon. A few scattered colonic diverticuli are noted, without surrounding inflammatory changes to indicate an acute diverticulitis at this time. The appendix is not confidently identified and may be surgically absent. Regardless, there are no inflammatory changes noted adjacent to the cecum to suggest the presence of an acute appendicitis at this time. Vascular/Lymphatic: Atherosclerosis throughout the abdominal aorta and pelvic vasculature. Extensive lymphadenopathy noted throughout the abdomen and pelvis, indicative of recurrent disease. Specific examples include retroperitoneal lymph nodes measuring up to 1.7 cm in short axis in the left para-aortic nodal station (axial image 79 of series 2), extensive lymphadenopathy along the pelvic sidewalls bilaterally, largest of which in the right obturator nodal station measuring 2 cm in short axis, and extensive bilateral inguinal lymphadenopathy (right-greater-than-left), most notable for a irregular-shaped nodal mass on the right (axial image 117 of series 2) measuring 4.8 x 7.2 cm. Reproductive: Prostate gland and seminal vesicles are unremarkable in appearance. Other: No significant volume of ascites.  No pneumoperitoneum. Musculoskeletal: There are no aggressive appearing lytic or blastic lesions noted in the visualized portions of the skeleton. IMPRESSION: 1. Extensive lymphadenopathy noted throughout the chest, abdomen and pelvis, as detailed above, indicative of recurrent disease. 2. Small bilateral pleural effusions lying dependently. 3. 4 mm right lower lobe pulmonary nodule, nonspecific, but statistically likely  benign. No follow-up needed if patient is low-risk.This recommendation follows the consensus statement: Guidelines for Management of Incidental Pulmonary Nodules Detected on CT Images: From the Fleischner Society 2017; Radiology 2017; 284:228-243.  4. Calcifications along the posterior wall of the urinary bladder, similar to the prior study. These may represent bladder calculi, but may represent mural calcifications in the wall of the urinary bladder. 5. Colonic diverticulosis without evidence of acute diverticulitis at this time. 6. Additional incidental findings, as above. Electronically Signed   By: Trudie Reed M.D.   On: 11/20/2022 08:26   ECHOCARDIOGRAM COMPLETE  Result Date: 11/19/2022    ECHOCARDIOGRAM REPORT   Patient Name:   HENRICK MELBER Date of Exam: 11/19/2022 Medical Rec #:  [id number]      Height:       69.5 in Accession #:    [id number]     Weight:       169.3 lb Date of Birth:  06-20-1934      BSA:          1.934 m Patient Age:    88 years       BP:           119/43 mmHg Patient Gender: M              HR:           79 bpm. Exam Location:  Inpatient Procedure: 2D Echo, Cardiac Doppler and Color Doppler Indications:    Bacteremia R78.81  History:        Patient has prior history of Echocardiogram examinations, most                 recent 04/16/2018. CAD; Risk Factors:Hypertension and                 Dyslipidemia.  Sonographer:    Harriette Bouillon RDCS Referring Phys: Val Riles SOOD IMPRESSIONS  1. Left ventricular ejection fraction, by estimation, is 60 to 65%. The left ventricle has normal function. The left ventricle has no regional wall motion abnormalities. Left ventricular diastolic parameters are consistent with Grade II diastolic dysfunction (pseudonormalization). Elevated left atrial pressure.  2. Right ventricular systolic function is normal. The right ventricular size is normal. There is normal pulmonary artery systolic pressure.  3. The mitral valve is degenerative. Mild mitral valve  regurgitation. Mild mitral stenosis. The mean mitral valve gradient is 5.3 mmHg with average heart rate of 77 bpm. Moderate to severe mitral annular calcification.  4. The aortic valve is tricuspid. There is moderate calcification of the aortic valve. There is moderate thickening of the aortic valve. Aortic valve regurgitation is not visualized. Aortic valve sclerosis/calcification is present, without any evidence of aortic stenosis. Comparison(s): The left ventricular diastolic function is significantly worse. Conclusion(s)/Recommendation(s): No evidence of valvular vegetations on this transthoracic echocardiogram. Consider a transesophageal echocardiogram to exclude infective endocarditis if clinically indicated. FINDINGS  Left Ventricle: Left ventricular ejection fraction, by estimation, is 60 to 65%. The left ventricle has normal function. The left ventricle has no regional wall motion abnormalities. The left ventricular internal cavity size was normal in size. There is  no left ventricular hypertrophy. Left ventricular diastolic parameters are consistent with Grade II diastolic dysfunction (pseudonormalization). Elevated left atrial pressure. Right Ventricle: The right ventricular size is normal. No increase in right ventricular wall thickness. Right ventricular systolic function is normal. There is normal pulmonary artery systolic pressure. The tricuspid regurgitant velocity is 2.80 m/s, and  with an assumed right atrial pressure of 3 mmHg, the estimated right ventricular systolic pressure is 34.4 mmHg. Left Atrium: Left atrial size was normal in size. Right Atrium: Right atrial size was normal in size. Pericardium: There is  no evidence of pericardial effusion. Mitral Valve: The mitral valve is degenerative in appearance. Moderate to severe mitral annular calcification. Mild mitral valve regurgitation. Mild mitral valve stenosis. The mean mitral valve gradient is 5.3 mmHg with average heart rate of 77 bpm.  Tricuspid Valve: The tricuspid valve is normal in structure. Tricuspid valve regurgitation is mild. Aortic Valve: The aortic valve is tricuspid. There is moderate calcification of the aortic valve. There is moderate thickening of the aortic valve. Aortic valve regurgitation is not visualized. Aortic valve sclerosis/calcification is present, without any  evidence of aortic stenosis. Pulmonic Valve: The pulmonic valve was not well visualized. Pulmonic valve regurgitation is not visualized. No evidence of pulmonic stenosis. Aorta: The aortic root and ascending aorta are structurally normal, with no evidence of dilitation. IAS/Shunts: No atrial level shunt detected by color flow Doppler.  LEFT VENTRICLE PLAX 2D LVIDd:         4.20 cm   Diastology LVIDs:         2.70 cm   LV e' medial:    6.74 cm/s LV PW:         0.90 cm   LV E/e' medial:  20.3 LV IVS:        0.90 cm   LV e' lateral:   9.79 cm/s LVOT diam:     2.10 cm   LV E/e' lateral: 14.0 LV SV:         86 LV SV Index:   44 LVOT Area:     3.46 cm  RIGHT VENTRICLE             IVC RV S prime:     17.00 cm/s  IVC diam: 1.10 cm TAPSE (M-mode): 3.0 cm LEFT ATRIUM             Index        RIGHT ATRIUM           Index LA diam:        4.00 cm 2.07 cm/m   RA Area:     16.90 cm LA Vol (A2C):   35.4 ml 18.30 ml/m  RA Volume:   44.20 ml  22.85 ml/m LA Vol (A4C):   41.7 ml 21.56 ml/m LA Biplane Vol: 38.5 ml 19.90 ml/m  AORTIC VALVE LVOT Vmax:   111.00 cm/s LVOT Vmean:  81.700 cm/s LVOT VTI:    0.247 m  AORTA Ao Root diam: 3.40 cm MITRAL VALVE                TRICUSPID VALVE MV Area (PHT): 2.47 cm     TR Peak grad:   31.4 mmHg MV Area VTI:   2.04 cm     TR Vmax:        280.00 cm/s MV Mean grad:  5.3 mmHg MV VTI:        0.42 m       SHUNTS MV Decel Time: 308 msec     Systemic VTI:  0.25 m MV E velocity: 137.00 cm/s  Systemic Diam: 2.10 cm MV A velocity: 139.00 cm/s MV E/A ratio:  0.99 Mihai Croitoru MD Electronically signed by Thurmon Fair MD Signature Date/Time:  11/19/2022/10:22:20 AM    Final     ASSESSMENT & PLAN:   88 y.o. 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 Currently with CT CAP showing progressive generalized lymphadenopathy. Most of the  disease is asymptomatic and the rt inguinal/pelvic LNadenopathy appears to be the only symptomatic bulky involvement causing possible venous compression and lymphedema.       No significant constitutional symptoms from [facility]. No significant cytopenias from [facility]. History of melanoma  3. Grp B streptococcal sepsis likely from cellulitis. TTE - no vegetations. PLAN:  -CBC on 11/20/2022 showed WBC of 6.5K, hemoglobin of 10.0, and platelets of 197K. -CT chest/abdm/pelvis scan on 11/19/2022 was discussed with him and showed: 1. Extensive lymphadenopathy noted throughout the chest, abdomen and pelvis, as detailed above, indicative of recurrent disease. 2. Small bilateral pleural effusions lying dependently. -patient has an enlarged lymph node in his right groin which is pushing on his veins and adding to his lower extremity swelling -patient has several other small lymph nodes in multiple areas including in his neck, chest, and abdomen, which are not significantly bothersome at this time -no indication to initiate systemic treatment for the the patient FL at this time , especially in the context of sepsis. -Antibiotic mx per ID/hospital medicine. -would recommend consulting radiation oncology for consideration of ISRT to symptomatic rt inguinal/pelvic LNadenopathy that is relatively bulky and causing possible venous compression. -there may be a role for patient to be seen by a podiatrist as outpatient to evaluate ingrown toenails which may be contributing to his risk of cellulitis. -connected with patient's daughter on the phone at bedside and discussed clinical/imaging findings and proceeding  treatment recommendations - I shall setup outpatient outpatient medical oncology f/u.  The total time spent in the appointment was 60 minutes* .  All of the patient's questions were answered with apparent satisfaction. The patient knows to call the clinic with any problems, questions or concerns.   Wyvonnia Lora MD MS AAHIVMS [facility] Hematology/Oncology Physician [facility]  .*Total Encounter Time as defined by the Centers for Medicare and Medicaid Services includes, in addition to the face-to-face time of a patient visit (documented in the note above) non-face-to-face time: obtaining and reviewing outside history, ordering and reviewing medications, tests or procedures, care coordination (communications with other health care professionals or caregivers) and documentation in the medical record.    I,Mitra Faeizi,acting as a Neurosurgeon for No name on file.,have documented all relevant documentation on the behalf of No name on file,as directed by  No name on file while in the presence of No name on file.  .I have reviewed the above documentation for accuracy and completeness, and I agree with the above. Wyvonnia Lora MD MS

## 2022-11-21 ENCOUNTER — Ambulatory Visit
Admit: 2022-11-21 | Discharge: 2022-11-21 | Disposition: A | Discharge status: Home / Self Care | Payer: Medicare Other | Attending: Radiation Oncology | Admitting: Radiation Oncology

## 2022-11-21 DIAGNOSIS — C8208 Follicular lymphoma grade I, lymph nodes of multiple sites: Secondary | ICD-10-CM

## 2022-11-21 DIAGNOSIS — L03115 Cellulitis of right lower limb: Secondary | ICD-10-CM | POA: Diagnosis not present

## 2022-11-21 DIAGNOSIS — R6521 Severe sepsis with septic shock: Secondary | ICD-10-CM | POA: Diagnosis not present

## 2022-11-21 DIAGNOSIS — C8298 Follicular lymphoma, unspecified, lymph nodes of multiple sites: Secondary | ICD-10-CM

## 2022-11-21 DIAGNOSIS — B951 Streptococcus, group B, as the cause of diseases classified elsewhere: Secondary | ICD-10-CM | POA: Diagnosis not present

## 2022-11-21 DIAGNOSIS — C8290 Follicular lymphoma, unspecified, unspecified site: Secondary | ICD-10-CM | POA: Diagnosis not present

## 2022-11-21 DIAGNOSIS — A419 Sepsis, unspecified organism: Secondary | ICD-10-CM | POA: Diagnosis not present

## 2022-11-21 MED ORDER — AMOXICILLIN 500 MG PO CAPS
1000.0000 mg | ORAL_CAPSULE | Freq: Three times a day (TID) | ORAL | Status: DC
  Administered 2022-11-21 – 2022-11-23 (×6): 1000 mg via ORAL
  Filled 2022-11-21 (×6): qty 2

## 2022-11-21 NOTE — Progress Notes (Addendum)
[facility] PROGRESS NOTE    Progress Note  ROSEMARY STAHLBERG  FAO:[id number] DOB: 24-May-1934 DOA: 11/17/2022 PCP: Barron Alvine, MD     Brief Narrative:   SHAQUEAL BORDES is an 88 y.o. male past medical history of essential hypertension, non-Hodgkin's lymphoma, MI DVT not on anticoagulation, chronic diastolic heart failure presented to [facility] long hospital to the ICU from [facility] for septic shock secondary to streptococcal bacteremia from right lower extremity cellulitis she was fluid resuscitated started on antibiotics and required pressors transferred to [facility] on 11/20/2022  Significant events: 9/1: admitted to [facility] ICU from [facility] on [place]. +GBS on blood cultures  9/2: pressors stopped  9/3: WBC continue to improve, no fevers. Assessment without evidence of cellulitis. BP soft but maintaining MAP without pressor support for ~24 hours.   Assessment/Plan:   Septic shock (HCC) secondary to group B streptococcus bacteremia in the setting of right lower extremity cellulitis: 1 out of 2 blood cultures grew GBS started empirically on antibiotics required pressors for 24 hours. Has defervesced leukocytosis improved. Currently on IV penicillin. Infectious ease was consulted.  Recommended TEE which will be performed on 11/22/2022. Surveillance blood cultures done on 11/20/2022 negative till date.  Right inguinal bulky adenopathy with compression of the right lower extremity venous system/low-grade follicular lymphoma: Has a follow-up appointment with Dr. Candise Che on 11/25/2022.  Dr. Candise Che has evaluated his right lower extremity pelvic region lymphadenopathy which is unchanged from the last time he is on. Oncology consulted recommended to consult radiation oncologist for symptomatic radiation of inguinal lymphadenopathy. Dr. Candise Che recommended a CT of chest abdomen and pelvis that showed extensive lymphadenopathy throughout the chest abdomen and pelvis indicative of recurrent  disease, small bilateral pleural effusion 4 mm right lower lobe pulmonary nodule. Awaiting further recommendations.  CAD status post stent: Continue atenolol, continue to hold Lasix lisinopril and Isordil.  Blood pressures borderline. Continue aspirin and pravastatin.  Essential hypertension/chronic diastolic heart failure: Continue atenolol, continue to hold Lasix lisinopril and Isordil. Her blood pressure is currently stable.  Normocytic anemia: Hemoglobin is relatively stable.  Stage II sacral decubitus ulcer present on admission: RN Pressure Injury Documentation: Pressure Injury 11/17/22 Buttocks Stage 2 -  Partial thickness loss of dermis presenting as a shallow open injury with a red, pink wound bed without slough. (Active)  11/17/22 1623  Location: Buttocks  Location Orientation:   Staging: Stage 2 -  Partial thickness loss of dermis presenting as a shallow open injury with a red, pink wound bed without slough.  Wound Description (Comments):   Present on Admission: Yes  Dressing Type Foam - Lift dressing to assess site every shift 11/20/22 0946     DVT prophylaxis: lovenox Family Communication:none Status is: Inpatient Remains inpatient appropriate because: Sepsis    Code Status:     Code Status Orders  (From admission, onward)           Start     Ordered   11/17/22 1647  Do not attempt resuscitation (DNR)- Limited -Do Not Intubate (DNI)  Continuous       Question Answer Comment  If pulseless and not breathing No CPR or chest compressions.   In Pre-Arrest Conditions (Patient Is Breathing and Has A Pulse) Do not intubate. Provide all appropriate non-invasive medical interventions. Avoid ICU transfer unless indicated or required.   Consent: Discussion documented in EHR or advanced directives reviewed      11/17/22 1659  Code Status History     This patient has a current code status but no historical code status.      Advance Directive  Documentation    Flowsheet Row Most Recent Value  Type of Advance Directive Healthcare Power of Attorney, Living will  Pre-existing out of facility DNR order (yellow form or pink MOST form) --  "MOST" Form in Place? --         IV Access:   Peripheral IV   Procedures and diagnostic studies:   CT CHEST ABDOMEN PELVIS W CONTRAST  Result Date: 11/20/2022 CLINICAL DATA:  88 year old male with history of follicular lymphoma. Evaluate for metastatic disease. * Tracking Code: BO * EXAM: CT CHEST, ABDOMEN, AND PELVIS WITH CONTRAST TECHNIQUE: Multidetector CT imaging of the chest, abdomen and pelvis was performed following the standard protocol during bolus administration of intravenous contrast. RADIATION DOSE REDUCTION: This exam was performed according to the departmental dose-optimization program which includes automated exposure control, adjustment of the mA and/or kV according to patient size and/or use of iterative reconstruction technique. CONTRAST:  OMNIPAQUE IOHEXOL 300 MG/ML  SOLN COMPARISON:  Multiple priors, most recently PET-CT 07/18/2021. FINDINGS: CT CHEST FINDINGS Cardiovascular: Heart size is normal. There is no significant pericardial fluid, thickening or pericardial calcification. There is aortic atherosclerosis, as well as atherosclerosis of the great vessels of the mediastinum and the coronary arteries, including calcified atherosclerotic plaque in the left main, left anterior descending, left circumflex and right coronary arteries. Thickening and calcification of the aortic valve. Mitral annular calcifications. Mediastinum/Nodes: While there is no definite enlarged hilar lymphadenopathy to correspond with the area of hypermetabolism on the prior PET-CT, there are numerous enlarged middle mediastinal lymph nodes on today's examination, concerning for residual disease, largest of which measure up to 1.8 cm in short axis (axial image 30 of series 2). Esophagus is unremarkable in  appearance. Enlarged right axillary lymph node (axial image 17 of series 2) measuring 1.4 cm in short axis. Lungs/Pleura: Small bilateral pleural effusions lying dependently. 4 mm right lower lobe pulmonary nodule (axial image 103 of series 4). No other larger more suspicious appearing pulmonary nodules or masses are noted. No confluent consolidative airspace disease. Patchy areas of ground-glass attenuation and mild septal thickening in the medial aspect of the right lower lobe, nonspecific, but likely of infectious or inflammatory etiology. Musculoskeletal: There are no aggressive appearing lytic or blastic lesions noted in the visualized portions of the skeleton. CT ABDOMEN PELVIS FINDINGS Hepatobiliary: No suspicious cystic or solid hepatic lesions. No intra or extrahepatic biliary ductal dilatation. Gallbladder is nearly completely decompressed, but otherwise unremarkable in appearance. Pancreas: Extensive coarse calcifications scattered throughout the pancreas, similar to prior studies, sequela of chronic pancreatitis. No discrete pancreatic mass. No pancreatic ductal dilatation. No pancreatic or peripancreatic fluid collections or inflammatory changes. Spleen: Unremarkable. Adrenals/Urinary Tract: Bilateral kidneys and adrenal glands are unremarkable in appearance. No hydroureteronephrosis. Tiny calcifications lying dependently in the urinary bladder, similar to the prior study, potentially bladder calculi, although these may simply represent mural calcifications along the posterior wall of the urinary bladder. Stomach/Bowel: The appearance of the stomach is unremarkable. No pathologic dilatation of small bowel or colon. A few scattered colonic diverticuli are noted, without surrounding inflammatory changes to indicate an acute diverticulitis at this time. The appendix is not confidently identified and may be surgically absent. Regardless, there are no inflammatory changes noted adjacent to the cecum to  suggest the presence of an acute appendicitis at this time. Vascular/Lymphatic: Atherosclerosis throughout  the abdominal aorta and pelvic vasculature. Extensive lymphadenopathy noted throughout the abdomen and pelvis, indicative of recurrent disease. Specific examples include retroperitoneal lymph nodes measuring up to 1.7 cm in short axis in the left para-aortic nodal station (axial image 79 of series 2), extensive lymphadenopathy along the pelvic sidewalls bilaterally, largest of which in the right obturator nodal station measuring 2 cm in short axis, and extensive bilateral inguinal lymphadenopathy (right-greater-than-left), most notable for a irregular-shaped nodal mass on the right (axial image 117 of series 2) measuring 4.8 x 7.2 cm. Reproductive: Prostate gland and seminal vesicles are unremarkable in appearance. Other: No significant volume of ascites.  No pneumoperitoneum. Musculoskeletal: There are no aggressive appearing lytic or blastic lesions noted in the visualized portions of the skeleton. IMPRESSION: 1. Extensive lymphadenopathy noted throughout the chest, abdomen and pelvis, as detailed above, indicative of recurrent disease. 2. Small bilateral pleural effusions lying dependently. 3. 4 mm right lower lobe pulmonary nodule, nonspecific, but statistically likely benign. No follow-up needed if patient is low-risk.This recommendation follows the consensus statement: Guidelines for Management of Incidental Pulmonary Nodules Detected on CT Images: From the Fleischner Society 2017; Radiology 2017; 284:228-243. 4. Calcifications along the posterior wall of the urinary bladder, similar to the prior study. These may represent bladder calculi, but may represent mural calcifications in the wall of the urinary bladder. 5. Colonic diverticulosis without evidence of acute diverticulitis at this time. 6. Additional incidental findings, as above. Electronically Signed   By: Trudie Reed M.D.   On: 11/20/2022  08:26   ECHOCARDIOGRAM COMPLETE  Result Date: 11/19/2022    ECHOCARDIOGRAM REPORT   Patient Name:   RIYAAN WALDNER Date of Exam: 11/19/2022 Medical Rec #:  [id number]      Height:       69.5 in Accession #:    [id number]     Weight:       169.3 lb Date of Birth:  June 17, 1934      BSA:          1.934 m Patient Age:    88 years       BP:           119/43 mmHg Patient Gender: M              HR:           79 bpm. Exam Location:  Inpatient Procedure: 2D Echo, Cardiac Doppler and Color Doppler Indications:    Bacteremia R78.81  History:        Patient has prior history of Echocardiogram examinations, most                 recent 04/16/2018. CAD; Risk Factors:Hypertension and                 Dyslipidemia.  Sonographer:    Harriette Bouillon RDCS Referring Phys: Val Riles SOOD IMPRESSIONS  1. Left ventricular ejection fraction, by estimation, is 60 to 65%. The left ventricle has normal function. The left ventricle has no regional wall motion abnormalities. Left ventricular diastolic parameters are consistent with Grade II diastolic dysfunction (pseudonormalization). Elevated left atrial pressure.  2. Right ventricular systolic function is normal. The right ventricular size is normal. There is normal pulmonary artery systolic pressure.  3. The mitral valve is degenerative. Mild mitral valve regurgitation. Mild mitral stenosis. The mean mitral valve gradient is 5.3 mmHg with average heart rate of 77 bpm. Moderate to severe mitral annular calcification.  4. The aortic valve is tricuspid. There is  moderate calcification of the aortic valve. There is moderate thickening of the aortic valve. Aortic valve regurgitation is not visualized. Aortic valve sclerosis/calcification is present, without any evidence of aortic stenosis. Comparison(s): The left ventricular diastolic function is significantly worse. Conclusion(s)/Recommendation(s): No evidence of valvular vegetations on this transthoracic echocardiogram. Consider a transesophageal  echocardiogram to exclude infective endocarditis if clinically indicated. FINDINGS  Left Ventricle: Left ventricular ejection fraction, by estimation, is 60 to 65%. The left ventricle has normal function. The left ventricle has no regional wall motion abnormalities. The left ventricular internal cavity size was normal in size. There is  no left ventricular hypertrophy. Left ventricular diastolic parameters are consistent with Grade II diastolic dysfunction (pseudonormalization). Elevated left atrial pressure. Right Ventricle: The right ventricular size is normal. No increase in right ventricular wall thickness. Right ventricular systolic function is normal. There is normal pulmonary artery systolic pressure. The tricuspid regurgitant velocity is 2.80 m/s, and  with an assumed right atrial pressure of 3 mmHg, the estimated right ventricular systolic pressure is 34.4 mmHg. Left Atrium: Left atrial size was normal in size. Right Atrium: Right atrial size was normal in size. Pericardium: There is no evidence of pericardial effusion. Mitral Valve: The mitral valve is degenerative in appearance. Moderate to severe mitral annular calcification. Mild mitral valve regurgitation. Mild mitral valve stenosis. The mean mitral valve gradient is 5.3 mmHg with average heart rate of 77 bpm. Tricuspid Valve: The tricuspid valve is normal in structure. Tricuspid valve regurgitation is mild. Aortic Valve: The aortic valve is tricuspid. There is moderate calcification of the aortic valve. There is moderate thickening of the aortic valve. Aortic valve regurgitation is not visualized. Aortic valve sclerosis/calcification is present, without any  evidence of aortic stenosis. Pulmonic Valve: The pulmonic valve was not well visualized. Pulmonic valve regurgitation is not visualized. No evidence of pulmonic stenosis. Aorta: The aortic root and ascending aorta are structurally normal, with no evidence of dilitation. IAS/Shunts: No atrial level  shunt detected by color flow Doppler.  LEFT VENTRICLE PLAX 2D LVIDd:         4.20 cm   Diastology LVIDs:         2.70 cm   LV e' medial:    6.74 cm/s LV PW:         0.90 cm   LV E/e' medial:  20.3 LV IVS:        0.90 cm   LV e' lateral:   9.79 cm/s LVOT diam:     2.10 cm   LV E/e' lateral: 14.0 LV SV:         86 LV SV Index:   44 LVOT Area:     3.46 cm  RIGHT VENTRICLE             IVC RV S prime:     17.00 cm/s  IVC diam: 1.10 cm TAPSE (M-mode): 3.0 cm LEFT ATRIUM             Index        RIGHT ATRIUM           Index LA diam:        4.00 cm 2.07 cm/m   RA Area:     16.90 cm LA Vol (A2C):   35.4 ml 18.30 ml/m  RA Volume:   44.20 ml  22.85 ml/m LA Vol (A4C):   41.7 ml 21.56 ml/m LA Biplane Vol: 38.5 ml 19.90 ml/m  AORTIC VALVE LVOT Vmax:   111.00 cm/s LVOT Vmean:  81.700 cm/s LVOT VTI:    0.247 m  AORTA Ao Root diam: 3.40 cm MITRAL VALVE                TRICUSPID VALVE MV Area (PHT): 2.47 cm     TR Peak grad:   31.4 mmHg MV Area VTI:   2.04 cm     TR Vmax:        280.00 cm/s MV Mean grad:  5.3 mmHg MV VTI:        0.42 m       SHUNTS MV Decel Time: 308 msec     Systemic VTI:  0.25 m MV E velocity: 137.00 cm/s  Systemic Diam: 2.10 cm MV A velocity: 139.00 cm/s MV E/A ratio:  0.99 Mihai Croitoru MD Electronically signed by Thurmon Fair MD Signature Date/Time: 11/19/2022/10:22:20 AM    Final      Medical Consultants:   None.   Subjective:    Beverly Gust Lierman has no new complaints.  Objective:    Vitals:   11/20/22 1144 11/20/22 1208 11/20/22 1944 11/21/22 0540  BP: (!) 106/44 (!) 105/45 (!) 112/51 (!) 112/49  Pulse: 63 66 70 71  Resp:  17 17 16   Temp:  98.4 F (36.9 C) 98.3 F (36.8 C) 98.5 F (36.9 C)  TempSrc:  Oral Oral Oral  SpO2:  100% 97% 95%  Weight:    74.9 kg  Height:       SpO2: 95 %   Intake/Output Summary (Last 24 hours) at 11/21/2022 0912 Last data filed at 11/21/2022 0800 Gross per 24 hour  Intake 692.79 ml  Output --  Net 692.79 ml   Filed Weights   11/19/22 1726  11/20/22 0500 11/21/22 0540  Weight: 77.3 kg 77.1 kg 74.9 kg    Exam: General exam: In no acute distress. Respiratory system: Good air movement and clear to auscultation. Cardiovascular system: S1 & S2 heard, RRR. No JVD. Gastrointestinal system: Abdomen is nondistended, soft and nontender.  Extremities: No pedal edema. Skin: No rashes, lesions or ulcers Psychiatry: Judgement and insight appear normal. Mood & affect appropriate.  Data Reviewed:    Labs: Basic Metabolic Panel: Recent Labs  Lab 11/18/22 0316 11/19/22 0259 11/20/22 0413  NA 136 140 139  K 3.4* 3.9 3.7  CL 106 109 107  CO2 23 25 25   GLUCOSE 127* 99 93  BUN 26* 19 16  CREATININE 0.96 0.86 0.81  CALCIUM 8.2* 8.4* 8.8*  MG 2.0  --  1.9  PHOS  --   --  2.1*   GFR Estimated Creatinine Clearance: 66.6 mL/min (by C-G formula based on SCr of 0.81 mg/dL). Liver Function Tests: No results for input(s): "AST", "ALT", "ALKPHOS", "BILITOT", "PROT", "ALBUMIN" in the last 168 hours. No results for input(s): "LIPASE", "AMYLASE" in the last 168 hours. No results for input(s): "AMMONIA" in the last 168 hours. Coagulation profile Recent Labs  Lab 11/17/22 1826  INR 1.4*   COVID-19 Labs  Recent Labs    11/20/22 0413  FERRITIN 71    Lab Results  Component Value Date   SARSCOV2NAA NEGATIVE 05/17/2019    CBC: Recent Labs  Lab 11/18/22 0316 11/19/22 0259 11/20/22 0413  WBC 15.9* 7.6 6.5  NEUTROABS 13.1* 5.7 4.5  HGB 10.3* 10.0* 10.0*  HCT 32.6* 31.7* 31.8*  MCV 91.8 90.3 90.1  PLT 183 175 197   Cardiac Enzymes: No results for input(s): "CKTOTAL", "CKMB", "CKMBINDEX", "TROPONINI" in the last 168 hours. BNP (last 3 results)  No results for input(s): "PROBNP" in the last 8760 hours. CBG: No results for input(s): "GLUCAP" in the last 168 hours. D-Dimer: No results for input(s): "DDIMER" in the last 72 hours. Hgb A1c: No results for input(s): "HGBA1C" in the last 72 hours. Lipid Profile: No results  for input(s): "CHOL", "HDL", "LDLCALC", "TRIG", "CHOLHDL", "LDLDIRECT" in the last 72 hours. Thyroid function studies: No results for input(s): "TSH", "T4TOTAL", "T3FREE", "THYROIDAB" in the last 72 hours.  Invalid input(s): "FREET3" Anemia work up: Recent Labs    11/20/22 0413  VITAMINB12 1,153*  FOLATE 7.7  FERRITIN 71  TIBC 224*  IRON 40*  RETICCTPCT 1.0   Sepsis Labs: Recent Labs  Lab 11/18/22 0316 11/19/22 0259 11/20/22 0413  WBC 15.9* 7.6 6.5   Microbiology Recent Results (from the past 240 hour(s))  MRSA Next Gen by PCR, Nasal     Status: None   Collection Time: 11/17/22  4:14 PM   Specimen: Nasal Mucosa; Nasal Swab  Result Value Ref Range Status   MRSA by PCR Next Gen NOT DETECTED NOT DETECTED Final    Comment: (NOTE) The GeneXpert MRSA Assay (FDA approved for NASAL specimens only), is one component of a comprehensive MRSA colonization surveillance program. It is not intended to diagnose MRSA infection nor to guide or monitor treatment for MRSA infections. Test performance is not FDA approved in patients less than 56 years old. Performed at [facility], [address], [place], [place] [address]   Culture, blood (Routine X 2) w Reflex to ID Panel     Status: None (Preliminary result)   Collection Time: 11/20/22 11:34 AM   Specimen: BLOOD  Result Value Ref Range Status   Specimen Description   Final    BLOOD BLOOD RIGHT ARM AEROBIC BOTTLE ONLY ANAEROBIC BOTTLE ONLY Performed at [facility], [address] [place], [place] [address]    Special Requests   Final    BOTTLES DRAWN AEROBIC AND ANAEROBIC Blood Culture results may not be optimal due to an excessive volume of blood received in culture bottles Performed at [facility], [address], [place], [place] [address]    Culture   Final    NO GROWTH < 24 HOURS Performed at [facility], [address] [place], [place] [address]    Report  Status PENDING  Incomplete  Culture, blood (Routine X 2) w Reflex to ID Panel     Status: None (Preliminary result)   Collection Time: 11/20/22 11:35 AM   Specimen: BLOOD  Result Value Ref Range Status   Specimen Description   Final    BLOOD BLOOD RIGHT HAND AEROBIC BOTTLE ONLY ANAEROBIC BOTTLE ONLY Performed at [facility], [address] [place], [place] [address]    Special Requests   Final    BOTTLES DRAWN AEROBIC AND ANAEROBIC Blood Culture results may not be optimal due to an excessive volume of blood received in culture bottles Performed at [facility], [address] [place], [place] [address]    Culture   Final    NO GROWTH < 24 HOURS Performed at [facility], [address], [place], [place] [address]    Report Status PENDING  Incomplete     Medications:    aspirin EC  81 mg Oral Daily   atenolol  25 mg Oral Daily   brimonidine  1 drop Right Eye BID   enoxaparin (LOVENOX) injection  40 mg Subcutaneous  Q24H   pravastatin  40 mg Oral QHS   Continuous Infusions:  sodium chloride 10 mL/hr at 11/19/22 0400   penicillin G potassium 12 Million Units in dextrose 5 % 500 mL CONTINUOUS infusion 12 Million Units (11/20/22 2118)      LOS: 4 days   Marinda Elk  [facility]  11/21/2022, 9:12 AM

## 2022-11-21 NOTE — Consult Note (Signed)
Radiation Oncology         [phone] ________________________________  Name: Harold [name]        MRN: [id number]  Date of Service: 11/21/22       DOB: 11-10-1934  GM:WNUUVO, Onalee Hua, MD  John Giovanni, MD     REFERRING PHYSICIAN: John Giovanni, MD   DIAGNOSIS:  Follicular Lymphoma   HISTORY OF PRESENT ILLNESS: Harold [name] is a 88 y.o. male seen at the request of Dr. Candise Che with a diagnosis of Low-grade follicular lymphoma.  He has been followed by Dr. Candise Che and his last appointment in March, the patient has been doing well without any progressive complaints.  He had been followed expectantly with this.  He was admitted however on 11/17/2022 after presenting to [facility] and taken in transfer for rigors and septic shock due to group B strep bacteremia.  He has been receiving antibiotics and by CT imaging of the chest abdomen and pelvis on 11/19/2018 for his known lymphoma appears to have progressed especially in the right greater than left inguinal lymph nodes measuring up to 7.2 cm on the left.  He has been experiencing extremity edema on that side.  He is seen to consider palliative radiotherapy.    PREVIOUS RADIATION THERAPY: No   PAST MEDICAL HISTORY:  Past Medical History:  Diagnosis Date   BPH (benign prostatic hypertrophy)    CAD (coronary artery disease) 01/2010   S/P drug eluting stents LAD and PL branch of [facility] Dr. 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   follicular lymphoma dx'd 06/2017   Hernia    HLD (hyperlipidemia)    HTN (hypertension)        PAST SURGICAL HISTORY: Past Surgical History:  Procedure Laterality Date   BACK SURGERY     CYSTOSCOPY WITH LITHOLAPAXY N/A 05/20/2019   Procedure: CYSTOSCOPY WITH LITHOLAPAXY;  Surgeon: Crist Fat, MD;  Location: [facility];  Service: Urology;  Laterality: N/A;   CYSTOSCOPY/URETEROSCOPY/HOLMIUM LASER/STENT PLACEMENT Left 05/20/2019   Procedure:  CYSTOSCOPY/URETEROSCOPY/HOLMIUM LASER/STENT PLACEMENT;  Surgeon: Crist Fat, MD;  Location: [facility];  Service: Urology;  Laterality: Left;   HERNIA REPAIR  01/08/11   RIH   MASS EXCISION Right 08/05/2017   Procedure: EXCISION RIGHT POSTERIOR  NECK LYMPH NODE;  Surgeon: Drema Halon, MD;  Location: [facility];  Service: ENT;  Laterality: Right;     FAMILY HISTORY:  Family History  Problem Relation Age of Onset   Heart failure Mother      SOCIAL HISTORY:  reports that he has never smoked. He has never used smokeless tobacco. He reports that he does not drink alcohol and does not use drugs.   ALLERGIES: Patient has no known allergies.   MEDICATIONS:  Current Facility-Administered Medications  Medication Dose Route Frequency Provider Last Rate Last Admin   0.9 %  sodium chloride infusion  250 mL Intravenous Continuous Hunsucker, Lesia Sago, MD 10 mL/hr at 11/19/22 0400 Infusion Verify at 11/19/22 0400   acetaminophen (TYLENOL) tablet 650 mg  650 mg Oral Q6H PRN Hunsucker, Lesia Sago, MD       aspirin EC tablet 81 mg  81 mg Oral Daily Hunsucker, Lesia Sago, MD   81 mg at 11/21/22 0900   atenolol (TENORMIN) tablet 25 mg  25 mg Oral Daily Marinda Elk, MD   25 mg at 11/21/22 0900   brimonidine (ALPHAGAN) 0.2 % ophthalmic solution  1 drop  1 drop Right Eye BID Hunsucker, Lesia Sago, MD   1 drop at 11/21/22 1000   docusate sodium (COLACE) capsule 100 mg  100 mg Oral BID PRN Hunsucker, Lesia Sago, MD       enoxaparin (LOVENOX) injection 40 mg  40 mg Subcutaneous Q24H Hunsucker, Lesia Sago, MD   40 mg at 11/20/22 1702   ipratropium-albuterol (DUONEB) 0.5-2.5 (3) MG/3ML nebulizer solution 3 mL  3 mL Nebulization Q6H PRN Hunsucker, Lesia Sago, MD       ondansetron (ZOFRAN) injection 4 mg  4 mg Intravenous Q6H PRN Hunsucker, Lesia Sago, MD       Oral care mouth rinse  15 mL Mouth Rinse PRN Hunsucker, Lesia Sago, MD       penicillin G potassium 12 Million Units in  dextrose 5 % 500 mL CONTINUOUS infusion  12 Million Units Intravenous Q12H Judyann Munson, MD 41.7 mL/hr at 11/21/22 1112 12 Million Units at 11/21/22 1112   polyethylene glycol (MIRALAX / GLYCOLAX) packet 17 g  17 g Oral Daily PRN Hunsucker, Lesia Sago, MD       pravastatin (PRAVACHOL) tablet 40 mg  40 mg Oral QHS Hunsucker, Lesia Sago, MD   40 mg at 11/20/22 2119     REVIEW OF SYSTEMS: On review of systems, the patient reports that he is doing pretty well.  While he describes his edema improving when he elevates his leg, this has been persistent his fevers and rigors have improved.  No other complaints are verbalized.  PHYSICAL EXAM:  Wt Readings from Last 3 Encounters:  11/21/22 165 lb 2 oz (74.9 kg)  05/27/22 163 lb 7 oz (74.1 kg)  12/26/21 162 lb 12.8 oz (73.8 kg)   Temp Readings from Last 3 Encounters:  11/21/22 98.3 F (36.8 C) (Oral)  05/27/22 (!) 97 F (36.1 C) (Temporal)  12/11/21 97.8 F (36.6 C) (Temporal)   BP Readings from Last 3 Encounters:  11/21/22 (!) 121/52  05/27/22 (!) 117/52  12/26/21 120/66   Pulse Readings from Last 3 Encounters:  11/21/22 64  05/27/22 71  12/26/21 78   Pain Assessment Pain Score: 0-No pain/10  In general this is a well appearing caucasian male in no acute distress. He's alert and oriented x4 and appropriate throughout the examination. Cardiopulmonary assessment is negative for acute distress and he exhibits normal effort.  His left lower extremity has trace edema along the tibial surface but right shows up to 2+ edema of the tibial surface.    ECOG = 1  0 - Asymptomatic (Fully active, able to carry on all predisease activities without restriction)  1 - Symptomatic but completely ambulatory (Restricted in physically strenuous activity but ambulatory and able to carry out work of a light or sedentary nature. For example, light housework, office work)  2 - Symptomatic, <50% in bed during the day (Ambulatory and capable of all self  care but unable to carry out any work activities. Up and about more than 50% of waking hours)  3 - Symptomatic, >50% in bed, but not bedbound (Capable of only limited self-care, confined to bed or chair 50% or more of waking hours)  4 - Bedbound (Completely disabled. Cannot carry on any self-care. Totally confined to bed or chair)  5 - Death   Santiago Glad MM, Creech RH, Tormey DC, et al. [phone]). "Toxicity and response criteria of the [facility]". Am. Evlyn Clines. Oncol. 5 (6): 649-55    LABORATORY DATA:  Lab Results  Component Value Date   WBC 6.5 11/20/2022   HGB 10.0 (L) 11/20/2022   HCT 31.8 (L) 11/20/2022   MCV 90.1 11/20/2022   PLT 197 11/20/2022   Lab Results  Component Value Date   NA 139 11/20/2022   K 3.7 11/20/2022   CL 107 11/20/2022   CO2 25 11/20/2022   Lab Results  Component Value Date   ALT 9 05/27/2022   AST 15 05/27/2022   ALKPHOS 68 05/27/2022   BILITOT 0.8 05/27/2022      RADIOGRAPHY: CT CHEST ABDOMEN PELVIS W CONTRAST  Result Date: 11/20/2022 CLINICAL DATA:  88 year old male with history of follicular lymphoma. Evaluate for metastatic disease. * Tracking Code: BO * EXAM: CT CHEST, ABDOMEN, AND PELVIS WITH CONTRAST TECHNIQUE: Multidetector CT imaging of the chest, abdomen and pelvis was performed following the standard protocol during bolus administration of intravenous contrast. RADIATION DOSE REDUCTION: This exam was performed according to the departmental dose-optimization program which includes automated exposure control, adjustment of the mA and/or kV according to patient size and/or use of iterative reconstruction technique. CONTRAST:  OMNIPAQUE IOHEXOL 300 MG/ML  SOLN COMPARISON:  Multiple priors, most recently PET-CT 07/18/2021. FINDINGS: CT CHEST FINDINGS Cardiovascular: Heart size is normal. There is no significant pericardial fluid, thickening or pericardial calcification. There is aortic atherosclerosis, as well as  atherosclerosis of the great vessels of the mediastinum and the coronary arteries, including calcified atherosclerotic plaque in the left main, left anterior descending, left circumflex and right coronary arteries. Thickening and calcification of the aortic valve. Mitral annular calcifications. Mediastinum/Nodes: While there is no definite enlarged hilar lymphadenopathy to correspond with the area of hypermetabolism on the prior PET-CT, there are numerous enlarged middle mediastinal lymph nodes on today's examination, concerning for residual disease, largest of which measure up to 1.8 cm in short axis (axial image 30 of series 2). Esophagus is unremarkable in appearance. Enlarged right axillary lymph node (axial image 17 of series 2) measuring 1.4 cm in short axis. Lungs/Pleura: Small bilateral pleural effusions lying dependently. 4 mm right lower lobe pulmonary nodule (axial image 103 of series 4). No other larger more suspicious appearing pulmonary nodules or masses are noted. No confluent consolidative airspace disease. Patchy areas of ground-glass attenuation and mild septal thickening in the medial aspect of the right lower lobe, nonspecific, but likely of infectious or inflammatory etiology. Musculoskeletal: There are no aggressive appearing lytic or blastic lesions noted in the visualized portions of the skeleton. CT ABDOMEN PELVIS FINDINGS Hepatobiliary: No suspicious cystic or solid hepatic lesions. No intra or extrahepatic biliary ductal dilatation. Gallbladder is nearly completely decompressed, but otherwise unremarkable in appearance. Pancreas: Extensive coarse calcifications scattered throughout the pancreas, similar to prior studies, sequela of chronic pancreatitis. No discrete pancreatic mass. No pancreatic ductal dilatation. No pancreatic or peripancreatic fluid collections or inflammatory changes. Spleen: Unremarkable. Adrenals/Urinary Tract: Bilateral kidneys and adrenal glands are unremarkable in  appearance. No hydroureteronephrosis. Tiny calcifications lying dependently in the urinary bladder, similar to the prior study, potentially bladder calculi, although these may simply represent mural calcifications along the posterior wall of the urinary bladder. Stomach/Bowel: The appearance of the stomach is unremarkable. No pathologic dilatation of small bowel or colon. A few scattered colonic diverticuli are noted, without surrounding inflammatory changes to indicate an acute diverticulitis at this time. The appendix is not confidently identified and may be surgically absent. Regardless, there are no inflammatory changes noted adjacent to the cecum to suggest the presence of an acute appendicitis at this time.  Vascular/Lymphatic: Atherosclerosis throughout the abdominal aorta and pelvic vasculature. Extensive lymphadenopathy noted throughout the abdomen and pelvis, indicative of recurrent disease. Specific examples include retroperitoneal lymph nodes measuring up to 1.7 cm in short axis in the left para-aortic nodal station (axial image 79 of series 2), extensive lymphadenopathy along the pelvic sidewalls bilaterally, largest of which in the right obturator nodal station measuring 2 cm in short axis, and extensive bilateral inguinal lymphadenopathy (right-greater-than-left), most notable for a irregular-shaped nodal mass on the right (axial image 117 of series 2) measuring 4.8 x 7.2 cm. Reproductive: Prostate gland and seminal vesicles are unremarkable in appearance. Other: No significant volume of ascites.  No pneumoperitoneum. Musculoskeletal: There are no aggressive appearing lytic or blastic lesions noted in the visualized portions of the skeleton. IMPRESSION: 1. Extensive lymphadenopathy noted throughout the chest, abdomen and pelvis, as detailed above, indicative of recurrent disease. 2. Small bilateral pleural effusions lying dependently. 3. 4 mm right lower lobe pulmonary nodule, nonspecific, but  statistically likely benign. No follow-up needed if patient is low-risk.This recommendation follows the consensus statement: Guidelines for Management of Incidental Pulmonary Nodules Detected on CT Images: From the Fleischner Society 2017; Radiology 2017; 284:228-243. 4. Calcifications along the posterior wall of the urinary bladder, similar to the prior study. These may represent bladder calculi, but may represent mural calcifications in the wall of the urinary bladder. 5. Colonic diverticulosis without evidence of acute diverticulitis at this time. 6. Additional incidental findings, as above. Electronically Signed   By: Trudie Reed M.D.   On: 11/20/2022 08:26   ECHOCARDIOGRAM COMPLETE  Result Date: 11/19/2022    ECHOCARDIOGRAM REPORT   Patient Name:   ABEER AMORIN Date of Exam: 11/19/2022 Medical Rec #:  [id number]      Height:       69.5 in Accession #:    [id number]     Weight:       169.3 lb Date of Birth:  09/16/1934      BSA:          1.934 m Patient Age:    88 years       BP:           119/43 mmHg Patient Gender: M              HR:           79 bpm. Exam Location:  Inpatient Procedure: 2D Echo, Cardiac Doppler and Color Doppler Indications:    Bacteremia R78.81  History:        Patient has prior history of Echocardiogram examinations, most                 recent 04/16/2018. CAD; Risk Factors:Hypertension and                 Dyslipidemia.  Sonographer:    Harriette Bouillon RDCS Referring Phys: Val Riles SOOD IMPRESSIONS  1. Left ventricular ejection fraction, by estimation, is 60 to 65%. The left ventricle has normal function. The left ventricle has no regional wall motion abnormalities. Left ventricular diastolic parameters are consistent with Grade II diastolic dysfunction (pseudonormalization). Elevated left atrial pressure.  2. Right ventricular systolic function is normal. The right ventricular size is normal. There is normal pulmonary artery systolic pressure.  3. The mitral valve is degenerative.  Mild mitral valve regurgitation. Mild mitral stenosis. The mean mitral valve gradient is 5.3 mmHg with average heart rate of 77 bpm. Moderate to severe mitral annular calcification.  4. The aortic valve  is tricuspid. There is moderate calcification of the aortic valve. There is moderate thickening of the aortic valve. Aortic valve regurgitation is not visualized. Aortic valve sclerosis/calcification is present, without any evidence of aortic stenosis. Comparison(s): The left ventricular diastolic function is significantly worse. Conclusion(s)/Recommendation(s): No evidence of valvular vegetations on this transthoracic echocardiogram. Consider a transesophageal echocardiogram to exclude infective endocarditis if clinically indicated. FINDINGS  Left Ventricle: Left ventricular ejection fraction, by estimation, is 60 to 65%. The left ventricle has normal function. The left ventricle has no regional wall motion abnormalities. The left ventricular internal cavity size was normal in size. There is  no left ventricular hypertrophy. Left ventricular diastolic parameters are consistent with Grade II diastolic dysfunction (pseudonormalization). Elevated left atrial pressure. Right Ventricle: The right ventricular size is normal. No increase in right ventricular wall thickness. Right ventricular systolic function is normal. There is normal pulmonary artery systolic pressure. The tricuspid regurgitant velocity is 2.80 m/s, and  with an assumed right atrial pressure of 3 mmHg, the estimated right ventricular systolic pressure is 34.4 mmHg. Left Atrium: Left atrial size was normal in size. Right Atrium: Right atrial size was normal in size. Pericardium: There is no evidence of pericardial effusion. Mitral Valve: The mitral valve is degenerative in appearance. Moderate to severe mitral annular calcification. Mild mitral valve regurgitation. Mild mitral valve stenosis. The mean mitral valve gradient is 5.3 mmHg with average heart  rate of 77 bpm. Tricuspid Valve: The tricuspid valve is normal in structure. Tricuspid valve regurgitation is mild. Aortic Valve: The aortic valve is tricuspid. There is moderate calcification of the aortic valve. There is moderate thickening of the aortic valve. Aortic valve regurgitation is not visualized. Aortic valve sclerosis/calcification is present, without any  evidence of aortic stenosis. Pulmonic Valve: The pulmonic valve was not well visualized. Pulmonic valve regurgitation is not visualized. No evidence of pulmonic stenosis. Aorta: The aortic root and ascending aorta are structurally normal, with no evidence of dilitation. IAS/Shunts: No atrial level shunt detected by color flow Doppler.  LEFT VENTRICLE PLAX 2D LVIDd:         4.20 cm   Diastology LVIDs:         2.70 cm   LV e' medial:    6.74 cm/s LV PW:         0.90 cm   LV E/e' medial:  20.3 LV IVS:        0.90 cm   LV e' lateral:   9.79 cm/s LVOT diam:     2.10 cm   LV E/e' lateral: 14.0 LV SV:         86 LV SV Index:   44 LVOT Area:     3.46 cm  RIGHT VENTRICLE             IVC RV S prime:     17.00 cm/s  IVC diam: 1.10 cm TAPSE (M-mode): 3.0 cm LEFT ATRIUM             Index        RIGHT ATRIUM           Index LA diam:        4.00 cm 2.07 cm/m   RA Area:     16.90 cm LA Vol (A2C):   35.4 ml 18.30 ml/m  RA Volume:   44.20 ml  22.85 ml/m LA Vol (A4C):   41.7 ml 21.56 ml/m LA Biplane Vol: 38.5 ml 19.90 ml/m  AORTIC VALVE LVOT Vmax:   111.00  cm/s LVOT Vmean:  81.700 cm/s LVOT VTI:    0.247 m  AORTA Ao Root diam: 3.40 cm MITRAL VALVE                TRICUSPID VALVE MV Area (PHT): 2.47 cm     TR Peak grad:   31.4 mmHg MV Area VTI:   2.04 cm     TR Vmax:        280.00 cm/s MV Mean grad:  5.3 mmHg MV VTI:        0.42 m       SHUNTS MV Decel Time: 308 msec     Systemic VTI:  0.25 m MV E velocity: 137.00 cm/s  Systemic Diam: 2.10 cm MV A velocity: 139.00 cm/s MV E/A ratio:  0.99 Mihai Croitoru MD Electronically signed by Thurmon Fair MD Signature  Date/Time: 11/19/2022/10:22:20 AM    Final        IMPRESSION/PLAN: 1. Progressive low-grade follicular lymphoma.  Dr. Mitzi Hansen has discussed the patient's images and history to date with his hospitalist and medical oncologist.  The patient appears to be a good candidate for a palliative course of radiotherapy to the right groin.  We discussed the risks, benefits, short, and long term effects of radiotherapy, as well as the palliative intent, and the patient is interested in proceeding. We reviewed the delivery and logistics of radiotherapy and anticipates a course of 10-12 fractions of radiotherapy.  Written consent is obtained and placed in the chart, a copy was provided to the patient.  We will plan for simulation tomorrow and begin treatment on Monday.  The patient is aware that he would not  need to remain inpatient just for radiation but that if he is still hospitalized by Monday we will ask our transportation staff to bring him down to the department, otherwise he and his family understand the need to proceed with therapy in the outpatient setting  In a visit lasting 60 minutes, greater than 50% of the time was spent face to face discussing the patient's condition, in preparation for the discussion, and coordinating the patient's care.  The patient's daughter Serena Colonel was available by phone as well for the consultation.       Osker Mason, [facility]   **Disclaimer: This note was dictated with voice recognition software. Similar sounding words can inadvertently be transcribed and this note may contain transcription errors which may not have been corrected upon publication of note.**

## 2022-11-21 NOTE — Progress Notes (Signed)
[facility]    Date of Admission:  11/17/2022   Total days of antibiotics 4          ID: Harold [name] is a 88 y.o. 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 Patient is concern with how he will make it to radiation for follicular lymphoma  Medications:   amoxicillin  1,000 mg Oral Q8H   aspirin EC  81 mg Oral Daily   atenolol  25 mg Oral Daily   brimonidine  1 drop Right Eye BID   enoxaparin (LOVENOX) injection  40 mg Subcutaneous Q24H   pravastatin  40 mg Oral QHS    Objective: Vital signs in last 24 hours: Temp:  [98.3 F (36.8 C)-98.5 F (36.9 C)] 98.3 F (36.8 C) (09/05 1246) Pulse Rate:  [64-71] 64 (09/05 1246) Resp:  [16-17] 17 (09/05 1246) BP: (112-121)/(49-52) 121/52 (09/05 1246) SpO2:  [95 %-100 %] 100 % (09/05 1246) Weight:  [74.9 kg] 74.9 kg (09/05 0540) Physical Exam  Constitutional: He is oriented to person, place, and time. He appears well-developed and well-nourished. No distress.  HENT:  Mouth/Throat: Oropharynx is clear and moist. No oropharyngeal exudate.  Cardiovascular: Normal rate, regular rhythm and normal heart sounds. Exam reveals no gallop and no friction rub.  No murmur heard.  Pulmonary/Chest: Effort normal and breath sounds normal. No respiratory distress. He has no wheezes.  Abdominal: Soft. Bowel sounds are normal. He exhibits no distension. There is no tenderness.  Lymphadenopathy:  Right inguinal adenopathy.  Ext: right leg swelling + but no erythema Neurological: He is alert and oriented to person, place, and time.  Skin: Skin is warm and dry. No rash noted. No erythema.  Psychiatric: He has a normal mood and  affect. His behavior is normal.    Lab Results Recent Labs    11/19/22 0259 11/20/22 0413  WBC 7.6 6.5  HGB 10.0* 10.0*  HCT 31.7* 31.8*  NA 140 139  K 3.9 3.7  CL 109 107  CO2 25 25  BUN 19 16  CREATININE 0.86 0.81   Microbiology: Blood cx from outside hospital 1 of 4 bottles + group b strep Studies/Results: CT CHEST ABDOMEN PELVIS W CONTRAST  Result Date: 11/20/2022 CLINICAL DATA:  88 year old male with history of follicular lymphoma. Evaluate for metastatic disease. * Tracking Code: BO * EXAM: CT CHEST, ABDOMEN, AND PELVIS WITH CONTRAST TECHNIQUE: Multidetector CT imaging of the chest, abdomen and pelvis was performed following the standard protocol during bolus administration of intravenous contrast. RADIATION DOSE REDUCTION: This exam was performed according to the departmental dose-optimization program which includes automated exposure control, adjustment of the mA and/or kV according to patient size and/or use of iterative reconstruction technique. CONTRAST:  OMNIPAQUE IOHEXOL 300 MG/ML  SOLN COMPARISON:  Multiple priors, most recently PET-CT 07/18/2021. FINDINGS: CT CHEST FINDINGS Cardiovascular: Heart size is normal. There is no significant pericardial fluid, thickening or pericardial calcification. There is aortic atherosclerosis, as well as atherosclerosis of the great vessels of the mediastinum and the coronary arteries, including calcified atherosclerotic plaque in the left main, left anterior descending, left circumflex and right coronary arteries. Thickening and calcification of the aortic valve. Mitral annular calcifications.  Mediastinum/Nodes: While there is no definite enlarged hilar lymphadenopathy to correspond with the area of hypermetabolism on the prior PET-CT, there are numerous enlarged middle mediastinal lymph nodes on today's examination, concerning for residual disease, largest of which measure up to 1.8 cm in short axis (axial image 30 of series 2). Esophagus  is unremarkable in appearance. Enlarged right axillary lymph node (axial image 17 of series 2) measuring 1.4 cm in short axis. Lungs/Pleura: Small bilateral pleural effusions lying dependently. 4 mm right lower lobe pulmonary nodule (axial image 103 of series 4). No other larger more suspicious appearing pulmonary nodules or masses are noted. No confluent consolidative airspace disease. Patchy areas of ground-glass attenuation and mild septal thickening in the medial aspect of the right lower lobe, nonspecific, but likely of infectious or inflammatory etiology. Musculoskeletal: There are no aggressive appearing lytic or blastic lesions noted in the visualized portions of the skeleton. CT ABDOMEN PELVIS FINDINGS Hepatobiliary: No suspicious cystic or solid hepatic lesions. No intra or extrahepatic biliary ductal dilatation. Gallbladder is nearly completely decompressed, but otherwise unremarkable in appearance. Pancreas: Extensive coarse calcifications scattered throughout the pancreas, similar to prior studies, sequela of chronic pancreatitis. No discrete pancreatic mass. No pancreatic ductal dilatation. No pancreatic or peripancreatic fluid collections or inflammatory changes. Spleen: Unremarkable. Adrenals/Urinary Tract: Bilateral kidneys and adrenal glands are unremarkable in appearance. No hydroureteronephrosis. Tiny calcifications lying dependently in the urinary bladder, similar to the prior study, potentially bladder calculi, although these may simply represent mural calcifications along the posterior wall of the urinary bladder. Stomach/Bowel: The appearance of the stomach is unremarkable. No pathologic dilatation of small bowel or colon. A few scattered colonic diverticuli are noted, without surrounding inflammatory changes to indicate an acute diverticulitis at this time. The appendix is not confidently identified and may be surgically absent. Regardless, there are no inflammatory changes noted adjacent to  the cecum to suggest the presence of an acute appendicitis at this time. Vascular/Lymphatic: Atherosclerosis throughout the abdominal aorta and pelvic vasculature. Extensive lymphadenopathy noted throughout the abdomen and pelvis, indicative of recurrent disease. Specific examples include retroperitoneal lymph nodes measuring up to 1.7 cm in short axis in the left para-aortic nodal station (axial image 79 of series 2), extensive lymphadenopathy along the pelvic sidewalls bilaterally, largest of which in the right obturator nodal station measuring 2 cm in short axis, and extensive bilateral inguinal lymphadenopathy (right-greater-than-left), most notable for a irregular-shaped nodal mass on the right (axial image 117 of series 2) measuring 4.8 x 7.2 cm. Reproductive: Prostate gland and seminal vesicles are unremarkable in appearance. Other: No significant volume of ascites.  No pneumoperitoneum. Musculoskeletal: There are no aggressive appearing lytic or blastic lesions noted in the visualized portions of the skeleton. IMPRESSION: 1. Extensive lymphadenopathy noted throughout the chest, abdomen and pelvis, as detailed above, indicative of recurrent disease. 2. Small bilateral pleural effusions lying dependently. 3. 4 mm right lower lobe pulmonary nodule, nonspecific, but statistically likely benign. No follow-up needed if patient is low-risk.This recommendation follows the consensus statement: Guidelines for Management of Incidental Pulmonary Nodules Detected on CT Images: From the Fleischner Society 2017; Radiology 2017; 284:228-243. 4. Calcifications along the posterior wall of the urinary bladder, similar to the prior study. These may represent bladder calculi, but may represent mural calcifications in the wall of the urinary bladder. 5. Colonic diverticulosis without evidence of acute diverticulitis at this time. 6. Additional incidental findings, as above. Electronically Signed   By: Trudie Reed M.D.   On:  11/20/2022 08:26  Assessment/Plan: Group b strep bacteremia with right leg cellulitis = improving. Blood cx appear to be clearing. Since low burden on admit ( only 1 of 4 cultures) will plan to treat with a total of 2 wk of abtx from 9/4. Can transition to amoxicillin 1gm TID once ready for discharge. No need for TEE given low burden and also improving. TTE did not suggest endocarditis, but realize this is lower sensitivity.  Leukocytosis = improved, resolved  Follicular lymphoma, bulky adenopathy = agree with plan for radiation to help minimize impact on venous return/right lower leg swelling.  Will sign off  [facility] Pager: [phone]  11/21/2022, 3:35 PM

## 2022-11-21 NOTE — Care Management Important Message (Signed)
Important Message  Patient Details IM Letter given Name: Harold [name] MRN: [id number] Date of Birth: [birth year]   Medicare Important Message Given:        Caren Macadam 11/21/2022, 1:11 PM

## 2022-11-22 ENCOUNTER — Encounter (HOSPITAL_COMMUNITY)
Disposition: A | Discharge status: Home / Self Care | Payer: Self-pay | Source: Other Acute Inpatient Hospital | Attending: Internal Medicine

## 2022-11-22 ENCOUNTER — Other Ambulatory Visit: Payer: Self-pay

## 2022-11-22 ENCOUNTER — Other Ambulatory Visit (HOSPITAL_COMMUNITY): Payer: Self-pay

## 2022-11-22 ENCOUNTER — Ambulatory Visit
Admit: 2022-11-22 | Discharge: 2022-11-22 | Disposition: A | Discharge status: Home / Self Care | Payer: Medicare Other | Source: Ambulatory Visit | Attending: Radiation Oncology | Admitting: Radiation Oncology

## 2022-11-22 DIAGNOSIS — C8208 Follicular lymphoma grade I, lymph nodes of multiple sites: Secondary | ICD-10-CM

## 2022-11-22 DIAGNOSIS — C8201 Follicular lymphoma grade I, lymph nodes of head, face, and neck: Secondary | ICD-10-CM | POA: Insufficient documentation

## 2022-11-22 DIAGNOSIS — Z510 Encounter for antineoplastic radiation therapy: Secondary | ICD-10-CM | POA: Insufficient documentation

## 2022-11-22 DIAGNOSIS — A419 Sepsis, unspecified organism: Secondary | ICD-10-CM | POA: Diagnosis not present

## 2022-11-22 DIAGNOSIS — R6521 Severe sepsis with septic shock: Secondary | ICD-10-CM | POA: Diagnosis not present

## 2022-11-22 SURGERY — ECHOCARDIOGRAM, TRANSESOPHAGEAL
Anesthesia: Monitor Anesthesia Care

## 2022-11-22 MED ORDER — POLYETHYLENE GLYCOL 3350 17 G PO PACK
17.0000 g | PACK | Freq: Two times a day (BID) | ORAL | Status: DC
  Administered 2022-11-22 – 2022-11-23 (×3): 17 g via ORAL
  Filled 2022-11-22 (×3): qty 1

## 2022-11-22 MED ORDER — AMOXICILLIN 500 MG PO CAPS
1000.0000 mg | ORAL_CAPSULE | Freq: Three times a day (TID) | ORAL | 0 refills | Status: AC
  Filled 2022-11-22: qty 66, 11d supply, fill #0

## 2022-11-22 NOTE — Progress Notes (Signed)
Mobility Specialist - Progress Note   11/22/22 1047  Mobility  Activity Ambulated with assistance to bathroom  Level of Assistance Standby assist, set-up cues, supervision of patient - no hands on  Assistive Device Front wheel walker  Distance Ambulated (ft) 5 ft  Activity Response Tolerated well  Mobility Referral Yes  $Mobility charge 1 Mobility  Mobility Specialist Start Time (ACUTE ONLY) 1043  Mobility Specialist Stop Time (ACUTE ONLY) 1047  Mobility Specialist Time Calculation (min) (ACUTE ONLY) 4 min   Pt received in recliner requesting assistance to bathroom. No complaints during session. NT made aware. Instructed pt to pull call bell when finished.    [facility]

## 2022-11-22 NOTE — Progress Notes (Signed)
[facility] PROGRESS NOTE    Progress Note  Harold [name]  WUJ:[id number] DOB: 12-16-1934 DOA: 11/17/2022 PCP: Barron Alvine, MD     Brief Narrative:   Harold [name] is an 88 y.o. male past medical history of essential hypertension, non-Hodgkin's lymphoma, MI DVT not on anticoagulation, chronic diastolic heart failure presented to [facility] long hospital to the ICU from [facility] for septic shock secondary to streptococcal bacteremia from right lower extremity cellulitis she was fluid resuscitated started on antibiotics and required pressors transferred to [facility] on 11/20/2022  Significant events: 9/1: admitted to [facility] ICU from [facility] on [place]. +GBS on blood cultures  9/2: pressors stopped  9/3: WBC continue to improve, no fevers. Assessment without evidence of cellulitis. BP soft but maintaining MAP without pressor support for ~24 hours.   Assessment/Plan:   Septic shock (HCC) secondary to group B streptococcus bacteremia in the setting of right lower extremity cellulitis: 1 out of 2 blood cultures grew GBS started empirically on antibiotics required pressors for 24 hours. Has defervesced leukocytosis improved. Currently on IV penicillin.  ID was consulted recommended oral amoxicillin. Surveillance blood cultures done on 11/20/2022 negative till date.  Right inguinal bulky adenopathy with compression of the right lower extremity venous system/low-grade follicular lymphoma: Has a follow-up appointment with Dr. Candise Che on 11/25/2022.  Dr. Candise Che has evaluated his right lower extremity pelvic region lymphadenopathy which is unchanged from the last time he is on. CT of chest abdomen and pelvis that showed extensive lymphadenopathy throughout the chest abdomen and pelvis indicative of recurrent disease, small bilateral pleural effusion 4 mm right lower lobe pulmonary nodule. Patient oncology was consulted for simulation today and to start radiation on Monday.  CAD status  post stent: Continue atenolol, continue to hold Lasix lisinopril and sildenafil. Blood pressure is stable. Continue aspirin and statins.    Essential hypertension/chronic diastolic heart failure: Continue atenolol, continue to hold Lasix lisinopril and Isordil. Her blood pressure is currently stable.  Normocytic anemia: Hemoglobin is relatively stable.  Stage II sacral decubitus ulcer present on admission: RN Pressure Injury Documentation: Pressure Injury 11/17/22 Buttocks Stage 2 -  Partial thickness loss of dermis presenting as a shallow open injury with a red, pink wound bed without slough. (Active)  11/17/22 1623  Location: Buttocks  Location Orientation:   Staging: Stage 2 -  Partial thickness loss of dermis presenting as a shallow open injury with a red, pink wound bed without slough.  Wound Description (Comments):   Present on Admission: Yes  Dressing Type Foam - Lift dressing to assess site every shift 11/21/22 0800     DVT prophylaxis: lovenox Family Communication:none Status is: Inpatient Remains inpatient appropriate because: Sepsis    Code Status:     Code Status Orders  (From admission, onward)           Start     Ordered   11/17/22 1647  Do not attempt resuscitation (DNR)- Limited -Do Not Intubate (DNI)  Continuous       Question Answer Comment  If pulseless and not breathing No CPR or chest compressions.   In Pre-Arrest Conditions (Patient Is Breathing and Has A Pulse) Do not intubate. Provide all appropriate non-invasive medical interventions. Avoid ICU transfer unless indicated or required.   Consent: Discussion documented in EHR or advanced directives reviewed      11/17/22 1659           Code Status History     This patient has a  current code status but no historical code status.      Advance Directive Documentation    Flowsheet Row Most Recent Value  Type of Advance Directive Healthcare Power of Attorney, Living will  Pre-existing  out of facility DNR order (yellow form or pink MOST form) --  "MOST" Form in Place? --         IV Access:   Peripheral IV   Procedures and diagnostic studies:   No results found.   Medical Consultants:   None.   Subjective:    Harold [name] no new complaints.  Objective:    Vitals:   11/21/22 1246 11/21/22 1928 11/22/22 0433 11/22/22 0500  BP: (!) 121/52 (!) 118/51 (!) 118/47   Pulse: 64 63 69   Resp: 17     Temp: 98.3 F (36.8 C) 98.6 F (37 C) 98.4 F (36.9 C)   TempSrc: Oral Oral Oral   SpO2: 100% 97% 96%   Weight:    78.4 kg  Height:       SpO2: 96 %   Intake/Output Summary (Last 24 hours) at 11/22/2022 0925 Last data filed at 11/22/2022 0600 Gross per 24 hour  Intake 1081.98 ml  Output --  Net 1081.98 ml   Filed Weights   11/20/22 0500 11/21/22 0540 11/22/22 0500  Weight: 77.1 kg 74.9 kg 78.4 kg    Exam: General exam: In no acute distress. Respiratory system: Good air movement and clear to auscultation. Cardiovascular system: S1 & S2 heard, RRR. No JVD. Gastrointestinal system: Abdomen is nondistended, soft and nontender.  Extremities: Right lower extremity swollen Skin: No rashes, lesions or ulcers Psychiatry: Judgement and insight appear normal. Mood & affect appropriate.  Data Reviewed:    Labs: Basic Metabolic Panel: Recent Labs  Lab 11/18/22 0316 11/19/22 0259 11/20/22 0413  NA 136 140 139  K 3.4* 3.9 3.7  CL 106 109 107  CO2 23 25 25   GLUCOSE 127* 99 93  BUN 26* 19 16  CREATININE 0.96 0.86 0.81  CALCIUM 8.2* 8.4* 8.8*  MG 2.0  --  1.9  PHOS  --   --  2.1*   GFR Estimated Creatinine Clearance: 66.6 mL/min (by C-G formula based on SCr of 0.81 mg/dL). Liver Function Tests: No results for input(s): "AST", "ALT", "ALKPHOS", "BILITOT", "PROT", "ALBUMIN" in the last 168 hours. No results for input(s): "LIPASE", "AMYLASE" in the last 168 hours. No results for input(s): "AMMONIA" in the last 168 hours. Coagulation  profile Recent Labs  Lab 11/17/22 1826  INR 1.4*   COVID-19 Labs  Recent Labs    11/20/22 0413  FERRITIN 71    Lab Results  Component Value Date   SARSCOV2NAA NEGATIVE 05/17/2019    CBC: Recent Labs  Lab 11/18/22 0316 11/19/22 0259 11/20/22 0413  WBC 15.9* 7.6 6.5  NEUTROABS 13.1* 5.7 4.5  HGB 10.3* 10.0* 10.0*  HCT 32.6* 31.7* 31.8*  MCV 91.8 90.3 90.1  PLT 183 175 197   Cardiac Enzymes: No results for input(s): "CKTOTAL", "CKMB", "CKMBINDEX", "TROPONINI" in the last 168 hours. BNP (last 3 results) No results for input(s): "PROBNP" in the last 8760 hours. CBG: No results for input(s): "GLUCAP" in the last 168 hours. D-Dimer: No results for input(s): "DDIMER" in the last 72 hours. Hgb A1c: No results for input(s): "HGBA1C" in the last 72 hours. Lipid Profile: No results for input(s): "CHOL", "HDL", "LDLCALC", "TRIG", "CHOLHDL", "LDLDIRECT" in the last 72 hours. Thyroid function studies: No results for input(s): "TSH", "T4TOTAL", "T3FREE", "  THYROIDAB" in the last 72 hours.  Invalid input(s): "FREET3" Anemia work up: Recent Labs    11/20/22 0413  VITAMINB12 1,153*  FOLATE 7.7  FERRITIN 71  TIBC 224*  IRON 40*  RETICCTPCT 1.0   Sepsis Labs: Recent Labs  Lab 11/18/22 0316 11/19/22 0259 11/20/22 0413  WBC 15.9* 7.6 6.5   Microbiology Recent Results (from the past 240 hour(s))  MRSA Next Gen by PCR, Nasal     Status: None   Collection Time: 11/17/22  4:14 PM   Specimen: Nasal Mucosa; Nasal Swab  Result Value Ref Range Status   MRSA by PCR Next Gen NOT DETECTED NOT DETECTED Final    Comment: (NOTE) The GeneXpert MRSA Assay (FDA approved for NASAL specimens only), is one component of a comprehensive MRSA colonization surveillance program. It is not intended to diagnose MRSA infection nor to guide or monitor treatment for MRSA infections. Test performance is not FDA approved in patients less than 40 years old. Performed at [facility], [address] [place], [place] [address]   Culture, blood (Routine X 2) w Reflex to ID Panel     Status: None (Preliminary result)   Collection Time: 11/20/22 11:34 AM   Specimen: BLOOD RIGHT ARM  Result Value Ref Range Status   Specimen Description   Final    BLOOD RIGHT ARM Performed at [facility], [address] [place], [place] [address]    Special Requests   Final    BOTTLES DRAWN AEROBIC AND ANAEROBIC Blood Culture results may not be optimal due to an excessive volume of blood received in culture bottles Performed at [facility], [address], [place], [place] [address]    Culture   Final    NO GROWTH 2 DAYS Performed at [facility], [address] [place], [place] [address]    Report Status PENDING  Incomplete  Culture, blood (Routine X 2) w Reflex to ID Panel     Status: None (Preliminary result)   Collection Time: 11/20/22 11:35 AM   Specimen: BLOOD RIGHT HAND  Result Value Ref Range Status   Specimen Description   Final    BLOOD RIGHT HAND Performed at [facility], [address], [place], [place] [address]    Special Requests   Final    BOTTLES DRAWN AEROBIC AND ANAEROBIC Blood Culture results may not be optimal due to an excessive volume of blood received in culture bottles Performed at [facility], [address], [place], [place] [address]    Culture   Final    NO GROWTH 2 DAYS Performed at [facility], [address] [place], [place] [address]    Report Status PENDING  Incomplete     Medications:    amoxicillin  1,000 mg Oral Q8H   aspirin EC  81 mg Oral Daily   atenolol  25 mg Oral Daily   brimonidine  1 drop Right Eye BID   enoxaparin (LOVENOX) injection  40 mg Subcutaneous Q24H   pravastatin  40 mg Oral QHS   Continuous Infusions:  sodium chloride 10 mL/hr at 11/19/22 0400      LOS: 5 days   Marinda Elk  [facility]  11/22/2022, 9:25 AM

## 2022-11-23 DIAGNOSIS — C8218 Follicular lymphoma grade II, lymph nodes of multiple sites: Secondary | ICD-10-CM

## 2022-11-23 DIAGNOSIS — A419 Sepsis, unspecified organism: Secondary | ICD-10-CM | POA: Diagnosis not present

## 2022-11-23 DIAGNOSIS — R6521 Severe sepsis with septic shock: Secondary | ICD-10-CM | POA: Diagnosis not present

## 2022-11-23 MED ORDER — POTASSIUM CHLORIDE CRYS ER 20 MEQ PO TBCR: 20.0000 meq | EXTENDED_RELEASE_TABLET | Freq: Every day | ORAL | Status: DC

## 2022-11-23 NOTE — Plan of Care (Signed)

## 2022-11-23 NOTE — Discharge Summary (Signed)
Physician Discharge Summary  Harold [name] ZOX:[id number] DOB: 05/08/1934 DOA: 11/17/2022  PCP: Barron Alvine, MD  Admit date: 11/17/2022 Discharge date: 11/23/2022  Admitted From: Home Disposition:  Home  Recommendations for Outpatient Follow-up:  Follow up with PCP in 1-2 weeks Please obtain BMP/CBC in one week   Home Health:No Equipment/Devices:None  Discharge Condition:Stable CODE STATUS:Full Diet recommendation: Heart Healthy   Brief/Interim Summary: 88 y.o. male past medical history of essential hypertension, non-Hodgkin's lymphoma, MI DVT not on anticoagulation, chronic diastolic heart failure presented to [facility] to the ICU from [facility] for septic shock secondary to streptococcal bacteremia from right lower extremity cellulitis she was fluid resuscitated started on antibiotics and required pressors transferred to [facility] on 11/20/2022   Significant events: 9/1: admitted to [facility] ICU from [facility] on [place]. +GBS on blood cultures  9/2: pressors stopped  9/3: WBC continue to improve, no fevers. Assessment without evidence of cellulitis. BP soft but maintaining MAP without pressor support for ~24 hours.   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 He defervesced leukocytosis improved. ID was consulted recommended oral amoxicillin for 2 weeks. Surveillance blood cultures were negative on 11/20/2022.  Right inguinal bulky adenopathy with compression of the right lower extremity venous system/low-grade follicular lymphoma: Dr. Izora Ribas was consulted who recommended consult radiation  therapy simulation was done on 11/22/2022, he will restart radiation therapy on 11/25/2022.   CAD status post stent: No changes made to medication continue current regimen.  Essential hypertension/chronic diastolic heart failure: Lisinopril, Isordil was stopped. He will continue Lasix and atenolol.  Normocytic anemia: Hemoglobin relatively stable follow-up with PCP as an outpatient.  Stage II CKD cubitus ulcer present on admission: Noted.  Discharge Instructions  Discharge Instructions     Diet - low sodium heart healthy   Complete by: As directed    Increase activity slowly   Complete by: As directed    No wound care   Complete by: As directed       Allergies as of 11/23/2022   No Known Allergies      Medication List     STOP taking these medications    isosorbide dinitrate 5 MG tablet Commonly known as: ISORDIL   lisinopril 5 MG tablet Commonly known as: ZESTRIL   ofloxacin 0.3 % ophthalmic solution Commonly known as: OCUFLOX   timolol 0.5 % ophthalmic solution Commonly known as: TIMOPTIC       TAKE these medications    amoxicillin 500 MG capsule Commonly known as: AMOXIL Take 2 capsules (1,000 mg total) by mouth every 8 (eight) hours for 11 days.   aspirin EC 81 MG tablet Take 1 tablet (81 mg total) by mouth daily. Swallow whole.   atenolol 25 MG tablet Commonly known as: TENORMIN Take 1 tablet (25 mg total) by mouth daily.   B-12 2500 MCG Tabs Take 2,500 mcg by mouth daily.   brimonidine 0.2 % ophthalmic solution Commonly known as: ALPHAGAN Place 1 drop into the right eye 2 (two) times daily.   cholecalciferol 1000 units tablet Commonly known as: VITAMIN D Take 1,000 Units by mouth daily.   finasteride 5  MG tablet Commonly known as: PROSCAR Take 5 mg by mouth daily.   furosemide 20 MG tablet Commonly known as: LASIX TAKE 1 TABLET BY MOUTH TWICE  DAILY   gabapentin 100 MG capsule Commonly known as: NEURONTIN Take 1 capsule by mouth  2 (two) times daily as needed (for knee pain).   nitroGLYCERIN 0.4 MG SL tablet Commonly known as: Nitrostat Place 1 tablet (0.4 mg total) under the tongue every 5 (five) minutes as needed for chest pain.   potassium chloride SA 20 MEQ tablet Commonly known as: KLOR-CON M Take 1 tablet (20 mEq total) by mouth daily. Please keep upcoming appointment for future refills. Thank you. What changed: when to take this   pravastatin 40 MG tablet Commonly known as: PRAVACHOL Take 1 tablet (40 mg total) by mouth at bedtime.        No Known Allergies  Consultations: Infectious disease Oncology   Procedures/Studies: CT CHEST ABDOMEN PELVIS W CONTRAST  Result Date: 11/20/2022 CLINICAL DATA:  88 year old male with history of follicular lymphoma. Evaluate for metastatic disease. * Tracking Code: BO * EXAM: CT CHEST, ABDOMEN, AND PELVIS WITH CONTRAST TECHNIQUE: Multidetector CT imaging of the chest, abdomen and pelvis was performed following the standard protocol during bolus administration of intravenous contrast. RADIATION DOSE REDUCTION: This exam was performed according to the departmental dose-optimization program which includes automated exposure control, adjustment of the mA and/or kV according to patient size and/or use of iterative reconstruction technique. CONTRAST:  OMNIPAQUE IOHEXOL 300 MG/ML  SOLN COMPARISON:  Multiple priors, most recently PET-CT 07/18/2021. FINDINGS: CT CHEST FINDINGS Cardiovascular: Heart size is normal. There is no significant pericardial fluid, thickening or pericardial calcification. There is aortic atherosclerosis, as well as atherosclerosis of the great vessels of the mediastinum and the coronary arteries, including calcified atherosclerotic plaque in the left main, left anterior descending, left circumflex and right coronary arteries. Thickening and calcification of the aortic valve. Mitral annular calcifications. Mediastinum/Nodes: While there is no definite  enlarged hilar lymphadenopathy to correspond with the area of hypermetabolism on the prior PET-CT, there are numerous enlarged middle mediastinal lymph nodes on today's examination, concerning for residual disease, largest of which measure up to 1.8 cm in short axis (axial image 30 of series 2). Esophagus is unremarkable in appearance. Enlarged right axillary lymph node (axial image 17 of series 2) measuring 1.4 cm in short axis. Lungs/Pleura: Small bilateral pleural effusions lying dependently. 4 mm right lower lobe pulmonary nodule (axial image 103 of series 4). No other larger more suspicious appearing pulmonary nodules or masses are noted. No confluent consolidative airspace disease. Patchy areas of ground-glass attenuation and mild septal thickening in the medial aspect of the right lower lobe, nonspecific, but likely of infectious or inflammatory etiology. Musculoskeletal: There are no aggressive appearing lytic or blastic lesions noted in the visualized portions of the skeleton. CT ABDOMEN PELVIS FINDINGS Hepatobiliary: No suspicious cystic or solid hepatic lesions. No intra or extrahepatic biliary ductal dilatation. Gallbladder is nearly completely decompressed, but otherwise unremarkable in appearance. Pancreas: Extensive coarse calcifications scattered throughout the pancreas, similar to prior studies, sequela of chronic pancreatitis. No discrete pancreatic mass. No pancreatic ductal dilatation. No pancreatic or peripancreatic fluid collections or inflammatory changes. Spleen: Unremarkable. Adrenals/Urinary Tract: Bilateral kidneys and adrenal glands are unremarkable in appearance. No hydroureteronephrosis. Tiny calcifications lying dependently in the urinary bladder, similar to the prior study, potentially bladder calculi, although these may simply represent mural calcifications along the posterior wall of the urinary bladder. Stomach/Bowel: The  appearance of the stomach is unremarkable. No pathologic  dilatation of small bowel or colon. A few scattered colonic diverticuli are noted, without surrounding inflammatory changes to indicate an acute diverticulitis at this time. The appendix is not confidently identified and may be surgically absent. Regardless, there are no inflammatory changes noted adjacent to the cecum to suggest the presence of an acute appendicitis at this time. Vascular/Lymphatic: Atherosclerosis throughout the abdominal aorta and pelvic vasculature. Extensive lymphadenopathy noted throughout the abdomen and pelvis, indicative of recurrent disease. Specific examples include retroperitoneal lymph nodes measuring up to 1.7 cm in short axis in the left para-aortic nodal station (axial image 79 of series 2), extensive lymphadenopathy along the pelvic sidewalls bilaterally, largest of which in the right obturator nodal station measuring 2 cm in short axis, and extensive bilateral inguinal lymphadenopathy (right-greater-than-left), most notable for a irregular-shaped nodal mass on the right (axial image 117 of series 2) measuring 4.8 x 7.2 cm. Reproductive: Prostate gland and seminal vesicles are unremarkable in appearance. Other: No significant volume of ascites.  No pneumoperitoneum. Musculoskeletal: There are no aggressive appearing lytic or blastic lesions noted in the visualized portions of the skeleton. IMPRESSION: 1. Extensive lymphadenopathy noted throughout the chest, abdomen and pelvis, as detailed above, indicative of recurrent disease. 2. Small bilateral pleural effusions lying dependently. 3. 4 mm right lower lobe pulmonary nodule, nonspecific, but statistically likely benign. No follow-up needed if patient is low-risk.This recommendation follows the consensus statement: Guidelines for Management of Incidental Pulmonary Nodules Detected on CT Images: From the Fleischner Society 2017; Radiology 2017; 284:228-243. 4. Calcifications along the posterior wall of the urinary bladder, similar to  the prior study. These may represent bladder calculi, but may represent mural calcifications in the wall of the urinary bladder. 5. Colonic diverticulosis without evidence of acute diverticulitis at this time. 6. Additional incidental findings, as above. Electronically Signed   By: Trudie Reed M.D.   On: 11/20/2022 08:26   ECHOCARDIOGRAM COMPLETE  Result Date: 11/19/2022    ECHOCARDIOGRAM REPORT   Patient Name:   Harold [name] Date of Exam: 11/19/2022 Medical Rec #:  [id number]      Height:       69.5 in Accession #:    [id number]     Weight:       169.3 lb Date of Birth:  June 03, 1934      BSA:          1.934 m Patient Age:    88 years       BP:           119/43 mmHg Patient Gender: M              HR:           79 bpm. Exam Location:  Inpatient Procedure: 2D Echo, Cardiac Doppler and Color Doppler Indications:    Bacteremia R78.81  History:        Patient has prior history of Echocardiogram examinations, most                 recent 04/16/2018. CAD; Risk Factors:Hypertension and                 Dyslipidemia.  Sonographer:    Harriette Bouillon RDCS Referring Phys: Val Riles SOOD IMPRESSIONS  1. Left ventricular ejection fraction, by estimation, is 60 to 65%. The left ventricle has normal function. The left ventricle has no regional wall motion abnormalities. Left ventricular diastolic parameters are consistent with Grade II diastolic dysfunction (  pseudonormalization). Elevated left atrial pressure.  2. Right ventricular systolic function is normal. The right ventricular size is normal. There is normal pulmonary artery systolic pressure.  3. The mitral valve is degenerative. Mild mitral valve regurgitation. Mild mitral stenosis. The mean mitral valve gradient is 5.3 mmHg with average heart rate of 77 bpm. Moderate to severe mitral annular calcification.  4. The aortic valve is tricuspid. There is moderate calcification of the aortic valve. There is moderate thickening of the aortic valve. Aortic valve regurgitation  is not visualized. Aortic valve sclerosis/calcification is present, without any evidence of aortic stenosis. Comparison(s): The left ventricular diastolic function is significantly worse. Conclusion(s)/Recommendation(s): No evidence of valvular vegetations on this transthoracic echocardiogram. Consider a transesophageal echocardiogram to exclude infective endocarditis if clinically indicated. FINDINGS  Left Ventricle: Left ventricular ejection fraction, by estimation, is 60 to 65%. The left ventricle has normal function. The left ventricle has no regional wall motion abnormalities. The left ventricular internal cavity size was normal in size. There is  no left ventricular hypertrophy. Left ventricular diastolic parameters are consistent with Grade II diastolic dysfunction (pseudonormalization). Elevated left atrial pressure. Right Ventricle: The right ventricular size is normal. No increase in right ventricular wall thickness. Right ventricular systolic function is normal. There is normal pulmonary artery systolic pressure. The tricuspid regurgitant velocity is 2.80 m/s, and  with an assumed right atrial pressure of 3 mmHg, the estimated right ventricular systolic pressure is 34.4 mmHg. Left Atrium: Left atrial size was normal in size. Right Atrium: Right atrial size was normal in size. Pericardium: There is no evidence of pericardial effusion. Mitral Valve: The mitral valve is degenerative in appearance. Moderate to severe mitral annular calcification. Mild mitral valve regurgitation. Mild mitral valve stenosis. The mean mitral valve gradient is 5.3 mmHg with average heart rate of 77 bpm. Tricuspid Valve: The tricuspid valve is normal in structure. Tricuspid valve regurgitation is mild. Aortic Valve: The aortic valve is tricuspid. There is moderate calcification of the aortic valve. There is moderate thickening of the aortic valve. Aortic valve regurgitation is not visualized. Aortic valve sclerosis/calcification  is present, without any  evidence of aortic stenosis. Pulmonic Valve: The pulmonic valve was not well visualized. Pulmonic valve regurgitation is not visualized. No evidence of pulmonic stenosis. Aorta: The aortic root and ascending aorta are structurally normal, with no evidence of dilitation. IAS/Shunts: No atrial level shunt detected by color flow Doppler.  LEFT VENTRICLE PLAX 2D LVIDd:         4.20 cm   Diastology LVIDs:         2.70 cm   LV e' medial:    6.74 cm/s LV PW:         0.90 cm   LV E/e' medial:  20.3 LV IVS:        0.90 cm   LV e' lateral:   9.79 cm/s LVOT diam:     2.10 cm   LV E/e' lateral: 14.0 LV SV:         86 LV SV Index:   44 LVOT Area:     3.46 cm  RIGHT VENTRICLE             IVC RV S prime:     17.00 cm/s  IVC diam: 1.10 cm TAPSE (M-mode): 3.0 cm LEFT ATRIUM             Index        RIGHT ATRIUM  Index LA diam:        4.00 cm 2.07 cm/m   RA Area:     16.90 cm LA Vol (A2C):   35.4 ml 18.30 ml/m  RA Volume:   44.20 ml  22.85 ml/m LA Vol (A4C):   41.7 ml 21.56 ml/m LA Biplane Vol: 38.5 ml 19.90 ml/m  AORTIC VALVE LVOT Vmax:   111.00 cm/s LVOT Vmean:  81.700 cm/s LVOT VTI:    0.247 m  AORTA Ao Root diam: 3.40 cm MITRAL VALVE                TRICUSPID VALVE MV Area (PHT): 2.47 cm     TR Peak grad:   31.4 mmHg MV Area VTI:   2.04 cm     TR Vmax:        280.00 cm/s MV Mean grad:  5.3 mmHg MV VTI:        0.42 m       SHUNTS MV Decel Time: 308 msec     Systemic VTI:  0.25 m MV E velocity: 137.00 cm/s  Systemic Diam: 2.10 cm MV A velocity: 139.00 cm/s MV E/A ratio:  0.99 Mihai Croitoru MD Electronically signed by Thurmon Fair MD Signature Date/Time: 11/19/2022/10:22:20 AM    Final    (Echo, Carotid, EGD, Colonoscopy, ERCP)    Subjective: No complaints  Discharge Exam: Vitals:   11/22/22 2032 11/23/22 0501  BP: (!) 111/51 (!) 117/48  Pulse: 63 71  Resp: 16 18  Temp: 98.2 F (36.8 C) 97.9 F (36.6 C)  SpO2: 99% 99%   Vitals:   11/22/22 0500 11/22/22 2032 11/23/22 0500  11/23/22 0501  BP:  (!) 111/51  (!) 117/48  Pulse:  63  71  Resp:  16  18  Temp:  98.2 F (36.8 C)  97.9 F (36.6 C)  TempSrc:  Oral  Oral  SpO2:  99%  99%  Weight: 78.4 kg  79.2 kg   Height:        General: Pt is alert, awake, not in acute distress Cardiovascular: RRR, S1/S2 +, no rubs, no gallops Respiratory: CTA bilaterally, no wheezing, no rhonchi Abdominal: Soft, NT, ND, bowel sounds + Extremities: no edema, no cyanosis    The results of significant diagnostics from this hospitalization (including imaging, microbiology, ancillary and laboratory) are listed below for reference.     Microbiology: Recent Results (from the past 240 hour(s))  MRSA Next Gen by PCR, Nasal     Status: None   Collection Time: 11/17/22  4:14 PM   Specimen: Nasal Mucosa; Nasal Swab  Result Value Ref Range Status   MRSA by PCR Next Gen NOT DETECTED NOT DETECTED Final    Comment: (NOTE) The GeneXpert MRSA Assay (FDA approved for NASAL specimens only), is one component of a comprehensive MRSA colonization surveillance program. It is not intended to diagnose MRSA infection nor to guide or monitor treatment for MRSA infections. Test performance is not FDA approved in patients less than 65 years old. Performed at [facility], [address], [place], [place] [address]   Culture, blood (Routine X 2) w Reflex to ID Panel     Status: None (Preliminary result)   Collection Time: 11/20/22 11:34 AM   Specimen: BLOOD RIGHT ARM  Result Value Ref Range Status   Specimen Description   Final    BLOOD RIGHT ARM Performed at [facility], [address] [place], [place] [address]    Special Requests  Final    BOTTLES DRAWN AEROBIC AND ANAEROBIC Blood Culture results may not be optimal due to an excessive volume of blood received in culture bottles Performed at [facility], [address], [place], [place] [address]    Culture   Final    NO GROWTH 3  DAYS Performed at [facility], [address], [place], [place] [address]    Report Status PENDING  Incomplete  Culture, blood (Routine X 2) w Reflex to ID Panel     Status: None (Preliminary result)   Collection Time: 11/20/22 11:35 AM   Specimen: BLOOD RIGHT HAND  Result Value Ref Range Status   Specimen Description   Final    BLOOD RIGHT HAND Performed at [facility], [address], [place], [place] [address]    Special Requests   Final    BOTTLES DRAWN AEROBIC AND ANAEROBIC Blood Culture results may not be optimal due to an excessive volume of blood received in culture bottles Performed at [facility], [address], [place], [place] [address]    Culture   Final    NO GROWTH 3 DAYS Performed at [facility], [address] [place], [place] [address]    Report Status PENDING  Incomplete     Labs: BNP (last 3 results) No results for input(s): "BNP" in the last 8760 hours. Basic Metabolic Panel: Recent Labs  Lab 11/18/22 0316 11/19/22 0259 11/20/22 0413  NA 136 140 139  K 3.4* 3.9 3.7  CL 106 109 107  CO2 23 25 25   GLUCOSE 127* 99 93  BUN 26* 19 16  CREATININE 0.96 0.86 0.81  CALCIUM 8.2* 8.4* 8.8*  MG 2.0  --  1.9  PHOS  --   --  2.1*   Liver Function Tests: No results for input(s): "AST", "ALT", "ALKPHOS", "BILITOT", "PROT", "ALBUMIN" in the last 168 hours. No results for input(s): "LIPASE", "AMYLASE" in the last 168 hours. No results for input(s): "AMMONIA" in the last 168 hours. CBC: Recent Labs  Lab 11/18/22 0316 11/19/22 0259 11/20/22 0413  WBC 15.9* 7.6 6.5  NEUTROABS 13.1* 5.7 4.5  HGB 10.3* 10.0* 10.0*  HCT 32.6* 31.7* 31.8*  MCV 91.8 90.3 90.1  PLT 183 175 197   Cardiac Enzymes: No results for input(s): "CKTOTAL", "CKMB", "CKMBINDEX", "TROPONINI" in the last 168 hours. BNP: Invalid input(s): "POCBNP" CBG: No results for input(s): "GLUCAP" in the last 168 hours. D-Dimer No results for input(s):  "DDIMER" in the last 72 hours. Hgb A1c No results for input(s): "HGBA1C" in the last 72 hours. Lipid Profile No results for input(s): "CHOL", "HDL", "LDLCALC", "TRIG", "CHOLHDL", "LDLDIRECT" in the last 72 hours. Thyroid function studies No results for input(s): "TSH", "T4TOTAL", "T3FREE", "THYROIDAB" in the last 72 hours.  Invalid input(s): "FREET3" Anemia work up No results for input(s): "VITAMINB12", "FOLATE", "FERRITIN", "TIBC", "IRON", "RETICCTPCT" in the last 72 hours. Urinalysis No results found for: "COLORURINE", "APPEARANCEUR", "LABSPEC", "PHURINE", "GLUCOSEU", "HGBUR", "BILIRUBINUR", "KETONESUR", "PROTEINUR", "UROBILINOGEN", "NITRITE", "LEUKOCYTESUR" Sepsis Labs Recent Labs  Lab 11/18/22 0316 11/19/22 0259 11/20/22 0413  WBC 15.9* 7.6 6.5   Microbiology Recent Results (from the past 240 hour(s))  MRSA Next Gen by PCR, Nasal     Status: None   Collection Time: 11/17/22  4:14 PM   Specimen: Nasal Mucosa; Nasal Swab  Result Value Ref Range Status   MRSA by PCR Next Gen NOT DETECTED NOT DETECTED Final    Comment: (NOTE) The GeneXpert  MRSA Assay (FDA approved for NASAL specimens only), is one component of a comprehensive MRSA colonization surveillance program. It is not intended to diagnose MRSA infection nor to guide or monitor treatment for MRSA infections. Test performance is not FDA approved in patients less than 64 years old. Performed at [facility], [address], [place], [place] [address]   Culture, blood (Routine X 2) w Reflex to ID Panel     Status: None (Preliminary result)   Collection Time: 11/20/22 11:34 AM   Specimen: BLOOD RIGHT ARM  Result Value Ref Range Status   Specimen Description   Final    BLOOD RIGHT ARM Performed at [facility], [address], [place], [place] [address]    Special Requests   Final    BOTTLES DRAWN AEROBIC AND ANAEROBIC Blood Culture results may not be optimal due to an excessive volume of blood  received in culture bottles Performed at [facility], [address] [place], [place] [address]    Culture   Final    NO GROWTH 3 DAYS Performed at [facility], [address] [place], [place] [address]    Report Status PENDING  Incomplete  Culture, blood (Routine X 2) w Reflex to ID Panel     Status: None (Preliminary result)   Collection Time: 11/20/22 11:35 AM   Specimen: BLOOD RIGHT HAND  Result Value Ref Range Status   Specimen Description   Final    BLOOD RIGHT HAND Performed at [facility], [address], [place], [place] [address]    Special Requests   Final    BOTTLES DRAWN AEROBIC AND ANAEROBIC Blood Culture results may not be optimal due to an excessive volume of blood received in culture bottles Performed at [facility], [address] [place], [place] [address]    Culture   Final    NO GROWTH 3 DAYS Performed at [facility], [address], [place], [place] [address]    Report Status PENDING  Incomplete     Time coordinating discharge: Over 35 minutes  SIGNED:   Marinda Elk, MD  [facility] 11/23/2022, 9:56 AM Pager   If 7PM-7AM, please contact night-coverage [email or web address] Password [email or web address]

## 2022-11-25 ENCOUNTER — Inpatient Hospital Stay: Payer: Medicare Other | Attending: Hematology

## 2022-11-25 ENCOUNTER — Ambulatory Visit
Admission: RE | Admit: 2022-11-25 | Discharge: 2022-11-25 | Disposition: A | Discharge status: Home / Self Care | Payer: Medicare Other | Source: Ambulatory Visit | Attending: Radiation Oncology | Admitting: Radiation Oncology

## 2022-11-25 ENCOUNTER — Other Ambulatory Visit: Payer: Self-pay

## 2022-11-25 ENCOUNTER — Inpatient Hospital Stay: Payer: Medicare Other | Admitting: Hematology

## 2022-11-25 VITALS — BP 130/55 | HR 86 | Temp 98.4°F | Resp 18 | Wt 168.9 lb

## 2022-11-25 DIAGNOSIS — C8208 Follicular lymphoma grade I, lymph nodes of multiple sites: Secondary | ICD-10-CM | POA: Diagnosis not present

## 2022-11-25 DIAGNOSIS — Z79899 Other long term (current) drug therapy: Secondary | ICD-10-CM | POA: Insufficient documentation

## 2022-11-25 DIAGNOSIS — C8201 Follicular lymphoma grade I, lymph nodes of head, face, and neck: Secondary | ICD-10-CM | POA: Insufficient documentation

## 2022-11-25 DIAGNOSIS — Z510 Encounter for antineoplastic radiation therapy: Secondary | ICD-10-CM | POA: Insufficient documentation

## 2022-11-25 LAB — RAD ONC ARIA SESSION SUMMARY
Course Elapsed Days: 0
Plan Fractions Treated to Date: 1
Plan Prescribed Dose Per Fraction: 2.5 Gy
Plan Total Fractions Prescribed: 10
Plan Total Prescribed Dose: 25 Gy
Reference Point Dosage Given to Date: 2.5 Gy
Reference Point Session Dosage Given: 2.5 Gy
Session Number: 1

## 2022-11-25 LAB — CBC WITH DIFFERENTIAL (CANCER CENTER ONLY)
Abs Immature Granulocytes: 0.13 10*3/uL — ABNORMAL HIGH (ref 0.00–0.07)
Basophils Absolute: 0 10*3/uL (ref 0.0–0.1)
Basophils Relative: 0 %
Eosinophils Absolute: 0.1 10*3/uL (ref 0.0–0.5)
Eosinophils Relative: 1 %
HCT: 33.6 % — ABNORMAL LOW (ref 39.0–52.0)
Hemoglobin: 10.7 g/dL — ABNORMAL LOW (ref 13.0–17.0)
Immature Granulocytes: 1 %
Lymphocytes Relative: 10 %
Lymphs Abs: 1.1 10*3/uL (ref 0.7–4.0)
MCH: 28.2 pg (ref 26.0–34.0)
MCHC: 31.8 g/dL (ref 30.0–36.0)
MCV: 88.7 fL (ref 80.0–100.0)
Monocytes Absolute: 1.1 10*3/uL — ABNORMAL HIGH (ref 0.1–1.0)
Monocytes Relative: 11 %
Neutro Abs: 8.2 10*3/uL — ABNORMAL HIGH (ref 1.7–7.7)
Neutrophils Relative %: 77 %
Platelet Count: 316 10*3/uL (ref 150–400)
RBC: 3.79 MIL/uL — ABNORMAL LOW (ref 4.22–5.81)
RDW: 14.2 % (ref 11.5–15.5)
WBC Count: 10.6 10*3/uL — ABNORMAL HIGH (ref 4.0–10.5)
nRBC: 0 % (ref 0.0–0.2)

## 2022-11-25 LAB — CULTURE, BLOOD (ROUTINE X 2)
Culture: NO GROWTH
Culture: NO GROWTH

## 2022-11-25 LAB — CMP (CANCER CENTER ONLY)
ALT: 10 U/L (ref 0–44)
AST: 12 U/L — ABNORMAL LOW (ref 15–41)
Albumin: 3.8 g/dL (ref 3.5–5.0)
Alkaline Phosphatase: 51 U/L (ref 38–126)
Anion gap: 8 (ref 5–15)
BUN: 25 mg/dL — ABNORMAL HIGH (ref 8–23)
CO2: 28 mmol/L (ref 22–32)
Calcium: 9.2 mg/dL (ref 8.9–10.3)
Chloride: 105 mmol/L (ref 98–111)
Creatinine: 0.91 mg/dL (ref 0.61–1.24)
GFR, Estimated: >60 mL/min (ref >60)
Glucose, Bld: 94 mg/dL (ref 70–99)
Potassium: 3.7 mmol/L (ref 3.5–5.1)
Sodium: 141 mmol/L (ref 135–145)
Total Bilirubin: 0.9 mg/dL (ref 0.3–1.2)
Total Protein: 6.8 g/dL (ref 6.5–8.1)

## 2022-11-25 LAB — LACTATE DEHYDROGENASE: LDH: 155 U/L (ref 98–192)

## 2022-11-25 NOTE — Progress Notes (Signed)
HEMATOLOGY/ONCOLOGY CLINIC NOTE  Date of Service: 11/25/2022  Patient Care Team: Barron Alvine, MD as PCP - General (Family Medicine) Kathleene Hazel, MD as PCP - Cardiology (Cardiology)  CHIEF COMPLAINTS/PURPOSE OF CONSULTATION:  Follow-up for continued valuation and management of low-grade follicular lymphoma   HISTORY OF PRESENTING ILLNESS:  please see previous notes for details on initial presentation.  INTERVAL HISTORY:  Harold [name] is here for continued valuation management of his low-grade follicular lymphoma.  Patient was last seen by me on 05/27/2022 and he complained of continuous testicular discomfort, but was was doing well. He was admitted to the hospital from 11/16/2022 to 11/17/2022.   Patient is accompanied by a family member during this visit. Patient complains of worsened left leg swelling and scrotal swelling during this visit. He denies any redness on his left leg or right leg swelling since getting discharged from the ED. Patient notes that his left leg swelling has been limiting him from walking.  Patient has been taking Lasix and 1 potassium supplement since he got discharged.   He denies fever, chills, night sweats, abdominal pain, chest pain, back pain, or abnormal bowel movement.   Patient has been scheduled for 12 sessions of radiation treatment.   MEDICAL HISTORY:  Past Medical History:  Diagnosis Date   BPH (benign prostatic hypertrophy)    CAD (coronary artery disease) 01/2010   S/P drug eluting stents LAD and PL branch of [facility] Dr. 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   follicular lymphoma dx'd 06/2017   Hernia    HLD (hyperlipidemia)    HTN (hypertension)     SURGICAL HISTORY: Past Surgical History:  Procedure Laterality Date   BACK SURGERY     CYSTOSCOPY WITH LITHOLAPAXY N/A 05/20/2019   Procedure: CYSTOSCOPY WITH LITHOLAPAXY;  Surgeon: Crist Fat, MD;  Location: [facility];   Service: Urology;  Laterality: N/A;   CYSTOSCOPY/URETEROSCOPY/HOLMIUM LASER/STENT PLACEMENT Left 05/20/2019   Procedure: CYSTOSCOPY/URETEROSCOPY/HOLMIUM LASER/STENT PLACEMENT;  Surgeon: Crist Fat, MD;  Location: [facility];  Service: Urology;  Laterality: Left;   HERNIA REPAIR  01/08/11   RIH   MASS EXCISION Right 08/05/2017   Procedure: EXCISION RIGHT POSTERIOR  NECK LYMPH NODE;  Surgeon: Drema Halon, MD;  Location: [facility];  Service: ENT;  Laterality: Right;    SOCIAL HISTORY: Social History   Socioeconomic History   Marital status: Married    Spouse name: Not on file   Number of children: Not on file   Years of education: Not on file   Highest education level: Not on file  Occupational History   Not on file  Tobacco Use   Smoking status: Never   Smokeless tobacco: Never  Vaping Use   Vaping status: Never Used  Substance and Sexual Activity   Alcohol use: No   Drug use: No   Sexual activity: Not on file  Other Topics Concern   Not on file  Social History Narrative   Married   Social Determinants of Health   Financial Resource Strain: Low Risk  (11/17/2022)   Received from [facility]   Overall Financial Resource Strain (CARDIA)    Difficulty of Paying Living Expenses: Not very hard  Food Insecurity: No Food Insecurity (11/17/2022)   Hunger Vital Sign    Worried About Running Out of Food in the Last Year: Never true    Ran Out of Food in the Last  Year: Never true  Transportation Needs: No Transportation Needs (11/17/2022)   PRAPARE - Administrator, Civil Service (Medical): No    Lack of Transportation (Non-Medical): No  Physical Activity: Inactive (11/17/2022)   Received from [facility]   Exercise Vital Sign    Days of Exercise per Week: 0 days    Minutes of Exercise per Session: 0 min  Stress: Stress Concern Present (11/17/2022)   Received from [facility] of Occupational [facility] - Occupational  Stress Questionnaire    Feeling of Stress : Rather much  Social Connections: Moderately Isolated (11/17/2022)   Received from [facility]   Social Connection and Isolation Panel [NHANES]    Frequency of Communication with Friends and Family: More than three times a week    Frequency of Social Gatherings with Friends and Family: More than three times a week    Attends Religious Services: More than 4 times per year    Active Member of [employer] or Organizations: No    Attends Banker Meetings: Never    Marital Status: Widowed  Intimate Partner Violence: Not At Risk (11/17/2022)   Humiliation, Afraid, Rape, and Kick questionnaire    Fear of Current or Ex-Partner: No    Emotionally Abused: No    Physically Abused: No    Sexually Abused: No    FAMILY HISTORY: Family History  Problem Relation Age of Onset   Heart failure Mother     ALLERGIES:  has No Known Allergies.  MEDICATIONS:  Current Outpatient Medications  Medication Sig Dispense Refill   amoxicillin (AMOXIL) 500 MG capsule Take 2 capsules (1,000 mg total) by mouth every 8 (eight) hours for 11 days. 66 capsule 0   aspirin EC 81 MG tablet Take 1 tablet (81 mg total) by mouth daily. Swallow whole. 90 tablet 3   atenolol (TENORMIN) 25 MG tablet Take 1 tablet (25 mg total) by mouth daily. 90 tablet 3   brimonidine (ALPHAGAN) 0.2 % ophthalmic solution Place 1 drop into the right eye 2 (two) times daily.     cholecalciferol (VITAMIN D) 1000 UNITS tablet Take 1,000 Units by mouth daily.     Cyanocobalamin (B-12) 2500 MCG TABS Take 2,500 mcg by mouth daily.     finasteride (PROSCAR) 5 MG tablet Take 5 mg by mouth daily.     furosemide (LASIX) 20 MG tablet TAKE 1 TABLET BY MOUTH TWICE  DAILY 180 tablet 3   gabapentin (NEURONTIN) 100 MG capsule Take 1 capsule by mouth 2 (two) times daily as needed (for knee pain).     nitroGLYCERIN (NITROSTAT) 0.4 MG SL tablet Place 1 tablet (0.4 mg total) under the tongue every 5 (five)  minutes as needed for chest pain. 25 tablet 6   potassium chloride SA (KLOR-CON M) 20 MEQ tablet Take 1 tablet (20 mEq total) by mouth daily. Please keep upcoming appointment for future refills. Thank you.     pravastatin (PRAVACHOL) 40 MG tablet Take 1 tablet (40 mg total) by mouth at bedtime. 90 tablet 3   No current facility-administered medications for this visit.    REVIEW OF SYSTEMS:   10 Point review of Systems was done is negative except as noted above.  PHYSICAL EXAMINATION: ECOG FS:2 - Symptomatic, <50% confined to bed  There were no vitals filed for this visit.    Wt Readings from Last 3 Encounters:  11/23/22 174 lb 9.7 oz (79.2 kg)  05/27/22 163 lb 7  oz (74.1 kg)  12/26/21 162 lb 12.8 oz (73.8 kg)   There is no height or weight on file to calculate BMI.   NAD GENERAL:alert, in no acute distress and comfortable SKIN: no acute rashes, no significant lesions EYES: conjunctiva are pink and non-injected, sclera anicteric NECK: supple, no JVD LYMPH:  1-2 cm lymph node in right groin. LUNGS: clear to auscultation b/l with normal respiratory effort HEART: regular rate & rhythm ABDOMEN:  normoactive bowel sounds , non tender, not distended. Extremity: no pedal edema PSYCH: alert & oriented x 3 with fluent speech NEURO: no focal motor/sensory deficits  LABORATORY DATA:  I have reviewed the data as listed     Latest Ref Rng & Units 11/20/2022    4:13 AM 11/19/2022    2:59 AM 11/18/2022    3:16 AM  CBC  WBC 4.0 - 10.5 K/uL 6.5  7.6  15.9   Hemoglobin 13.0 - 17.0 g/dL 51.8  84.1  66.0   Hematocrit 39.0 - 52.0 % 31.8  31.7  32.6   Platelets 150 - 400 K/uL 197  175  183        Latest Ref Rng & Units 11/20/2022    4:13 AM 11/19/2022    2:59 AM 11/18/2022    3:16 AM  CMP  Glucose 70 - 99 mg/dL 93  99  630   BUN 8 - 23 mg/dL 16  19  26    Creatinine 0.61 - 1.24 mg/dL 1.60  1.09  3.23   Sodium 135 - 145 mmol/L 139  140  136   Potassium 3.5 - 5.1 mmol/L 3.7  3.9  3.4    Chloride 98 - 111 mmol/L 107  109  106   CO2 22 - 32 mmol/L 25  25  23    Calcium 8.9 - 10.3 mg/dL 8.8  8.4  8.2    Lab Results  Component Value Date   LDH 148 05/27/2022     02/18/2019 CT Abdomen Pelvis W Contrast (Accession [id number]):   02/18/2019 CT Chest W Contrast (Accession [id number]):  08/08/17 Pathology:   08/05/17 Pathology:     RADIOGRAPHIC STUDIES: I have personally reviewed the radiological images as listed and agreed with the findings in the report. CT CHEST ABDOMEN PELVIS W CONTRAST  Result Date: 11/20/2022 CLINICAL DATA:  88 year old male with history of follicular lymphoma. Evaluate for metastatic disease. * Tracking Code: BO * EXAM: CT CHEST, ABDOMEN, AND PELVIS WITH CONTRAST TECHNIQUE: Multidetector CT imaging of the chest, abdomen and pelvis was performed following the standard protocol during bolus administration of intravenous contrast. RADIATION DOSE REDUCTION: This exam was performed according to the departmental dose-optimization program which includes automated exposure control, adjustment of the mA and/or kV according to patient size and/or use of iterative reconstruction technique. CONTRAST:  OMNIPAQUE IOHEXOL 300 MG/ML  SOLN COMPARISON:  Multiple priors, most recently PET-CT 07/18/2021. FINDINGS: CT CHEST FINDINGS Cardiovascular: Heart size is normal. There is no significant pericardial fluid, thickening or pericardial calcification. There is aortic atherosclerosis, as well as atherosclerosis of the great vessels of the mediastinum and the coronary arteries, including calcified atherosclerotic plaque in the left main, left anterior descending, left circumflex and right coronary arteries. Thickening and calcification of the aortic valve. Mitral annular calcifications. Mediastinum/Nodes: While there is no definite enlarged hilar lymphadenopathy to correspond with the area of hypermetabolism on the prior PET-CT, there are numerous enlarged middle mediastinal  lymph nodes on today's examination, concerning for residual disease, largest of which measure up  to 1.8 cm in short axis (axial image 30 of series 2). Esophagus is unremarkable in appearance. Enlarged right axillary lymph node (axial image 17 of series 2) measuring 1.4 cm in short axis. Lungs/Pleura: Small bilateral pleural effusions lying dependently. 4 mm right lower lobe pulmonary nodule (axial image 103 of series 4). No other larger more suspicious appearing pulmonary nodules or masses are noted. No confluent consolidative airspace disease. Patchy areas of ground-glass attenuation and mild septal thickening in the medial aspect of the right lower lobe, nonspecific, but likely of infectious or inflammatory etiology. Musculoskeletal: There are no aggressive appearing lytic or blastic lesions noted in the visualized portions of the skeleton. CT ABDOMEN PELVIS FINDINGS Hepatobiliary: No suspicious cystic or solid hepatic lesions. No intra or extrahepatic biliary ductal dilatation. Gallbladder is nearly completely decompressed, but otherwise unremarkable in appearance. Pancreas: Extensive coarse calcifications scattered throughout the pancreas, similar to prior studies, sequela of chronic pancreatitis. No discrete pancreatic mass. No pancreatic ductal dilatation. No pancreatic or peripancreatic fluid collections or inflammatory changes. Spleen: Unremarkable. Adrenals/Urinary Tract: Bilateral kidneys and adrenal glands are unremarkable in appearance. No hydroureteronephrosis. Tiny calcifications lying dependently in the urinary bladder, similar to the prior study, potentially bladder calculi, although these may simply represent mural calcifications along the posterior wall of the urinary bladder. Stomach/Bowel: The appearance of the stomach is unremarkable. No pathologic dilatation of small bowel or colon. A few scattered colonic diverticuli are noted, without surrounding inflammatory changes to indicate an acute  diverticulitis at this time. The appendix is not confidently identified and may be surgically absent. Regardless, there are no inflammatory changes noted adjacent to the cecum to suggest the presence of an acute appendicitis at this time. Vascular/Lymphatic: Atherosclerosis throughout the abdominal aorta and pelvic vasculature. Extensive lymphadenopathy noted throughout the abdomen and pelvis, indicative of recurrent disease. Specific examples include retroperitoneal lymph nodes measuring up to 1.7 cm in short axis in the left para-aortic nodal station (axial image 79 of series 2), extensive lymphadenopathy along the pelvic sidewalls bilaterally, largest of which in the right obturator nodal station measuring 2 cm in short axis, and extensive bilateral inguinal lymphadenopathy (right-greater-than-left), most notable for a irregular-shaped nodal mass on the right (axial image 117 of series 2) measuring 4.8 x 7.2 cm. Reproductive: Prostate gland and seminal vesicles are unremarkable in appearance. Other: No significant volume of ascites.  No pneumoperitoneum. Musculoskeletal: There are no aggressive appearing lytic or blastic lesions noted in the visualized portions of the skeleton. IMPRESSION: 1. Extensive lymphadenopathy noted throughout the chest, abdomen and pelvis, as detailed above, indicative of recurrent disease. 2. Small bilateral pleural effusions lying dependently. 3. 4 mm right lower lobe pulmonary nodule, nonspecific, but statistically likely benign. No follow-up needed if patient is low-risk.This recommendation follows the consensus statement: Guidelines for Management of Incidental Pulmonary Nodules Detected on CT Images: From the Fleischner Society 2017; Radiology 2017; 284:228-243. 4. Calcifications along the posterior wall of the urinary bladder, similar to the prior study. These may represent bladder calculi, but may represent mural calcifications in the wall of the urinary bladder. 5. Colonic  diverticulosis without evidence of acute diverticulitis at this time. 6. Additional incidental findings, as above. Electronically Signed   By: Trudie Reed M.D.   On: 11/20/2022 08:26   ECHOCARDIOGRAM COMPLETE  Result Date: 11/19/2022    ECHOCARDIOGRAM REPORT   Patient Name:   Harold [name] Date of Exam: 11/19/2022 Medical Rec #:  [id number]      Height:  69.5 in Accession #:    [id number]     Weight:       169.3 lb Date of Birth:  December 08, 1934      BSA:          1.934 m Patient Age:    88 years       BP:           119/43 mmHg Patient Gender: M              HR:           79 bpm. Exam Location:  Inpatient Procedure: 2D Echo, Cardiac Doppler and Color Doppler Indications:    Bacteremia R78.81  History:        Patient has prior history of Echocardiogram examinations, most                 recent 04/16/2018. CAD; Risk Factors:Hypertension and                 Dyslipidemia.  Sonographer:    Harriette Bouillon RDCS Referring Phys: Val Riles SOOD IMPRESSIONS  1. Left ventricular ejection fraction, by estimation, is 60 to 65%. The left ventricle has normal function. The left ventricle has no regional wall motion abnormalities. Left ventricular diastolic parameters are consistent with Grade II diastolic dysfunction (pseudonormalization). Elevated left atrial pressure.  2. Right ventricular systolic function is normal. The right ventricular size is normal. There is normal pulmonary artery systolic pressure.  3. The mitral valve is degenerative. Mild mitral valve regurgitation. Mild mitral stenosis. The mean mitral valve gradient is 5.3 mmHg with average heart rate of 77 bpm. Moderate to severe mitral annular calcification.  4. The aortic valve is tricuspid. There is moderate calcification of the aortic valve. There is moderate thickening of the aortic valve. Aortic valve regurgitation is not visualized. Aortic valve sclerosis/calcification is present, without any evidence of aortic stenosis. Comparison(s): The left  ventricular diastolic function is significantly worse. Conclusion(s)/Recommendation(s): No evidence of valvular vegetations on this transthoracic echocardiogram. Consider a transesophageal echocardiogram to exclude infective endocarditis if clinically indicated. FINDINGS  Left Ventricle: Left ventricular ejection fraction, by estimation, is 60 to 65%. The left ventricle has normal function. The left ventricle has no regional wall motion abnormalities. The left ventricular internal cavity size was normal in size. There is  no left ventricular hypertrophy. Left ventricular diastolic parameters are consistent with Grade II diastolic dysfunction (pseudonormalization). Elevated left atrial pressure. Right Ventricle: The right ventricular size is normal. No increase in right ventricular wall thickness. Right ventricular systolic function is normal. There is normal pulmonary artery systolic pressure. The tricuspid regurgitant velocity is 2.80 m/s, and  with an assumed right atrial pressure of 3 mmHg, the estimated right ventricular systolic pressure is 34.4 mmHg. Left Atrium: Left atrial size was normal in size. Right Atrium: Right atrial size was normal in size. Pericardium: There is no evidence of pericardial effusion. Mitral Valve: The mitral valve is degenerative in appearance. Moderate to severe mitral annular calcification. Mild mitral valve regurgitation. Mild mitral valve stenosis. The mean mitral valve gradient is 5.3 mmHg with average heart rate of 77 bpm. Tricuspid Valve: The tricuspid valve is normal in structure. Tricuspid valve regurgitation is mild. Aortic Valve: The aortic valve is tricuspid. There is moderate calcification of the aortic valve. There is moderate thickening of the aortic valve. Aortic valve regurgitation is not visualized. Aortic valve sclerosis/calcification is present, without any  evidence of aortic stenosis. Pulmonic Valve: The pulmonic valve was not well  visualized. Pulmonic valve  regurgitation is not visualized. No evidence of pulmonic stenosis. Aorta: The aortic root and ascending aorta are structurally normal, with no evidence of dilitation. IAS/Shunts: No atrial level shunt detected by color flow Doppler.  LEFT VENTRICLE PLAX 2D LVIDd:         4.20 cm   Diastology LVIDs:         2.70 cm   LV e' medial:    6.74 cm/s LV PW:         0.90 cm   LV E/e' medial:  20.3 LV IVS:        0.90 cm   LV e' lateral:   9.79 cm/s LVOT diam:     2.10 cm   LV E/e' lateral: 14.0 LV SV:         86 LV SV Index:   44 LVOT Area:     3.46 cm  RIGHT VENTRICLE             IVC RV S prime:     17.00 cm/s  IVC diam: 1.10 cm TAPSE (M-mode): 3.0 cm LEFT ATRIUM             Index        RIGHT ATRIUM           Index LA diam:        4.00 cm 2.07 cm/m   RA Area:     16.90 cm LA Vol (A2C):   35.4 ml 18.30 ml/m  RA Volume:   44.20 ml  22.85 ml/m LA Vol (A4C):   41.7 ml 21.56 ml/m LA Biplane Vol: 38.5 ml 19.90 ml/m  AORTIC VALVE LVOT Vmax:   111.00 cm/s LVOT Vmean:  81.700 cm/s LVOT VTI:    0.247 m  AORTA Ao Root diam: 3.40 cm MITRAL VALVE                TRICUSPID VALVE MV Area (PHT): 2.47 cm     TR Peak grad:   31.4 mmHg MV Area VTI:   2.04 cm     TR Vmax:        280.00 cm/s MV Mean grad:  5.3 mmHg MV VTI:        0.42 m       SHUNTS MV Decel Time: 308 msec     Systemic VTI:  0.25 m MV E velocity: 137.00 cm/s  Systemic Diam: 2.10 cm MV A velocity: 139.00 cm/s MV E/A ratio:  0.99 Mihai Croitoru MD Electronically signed by Thurmon Fair MD Signature Date/Time: 11/19/2022/10:22:20 AM    Final      ASSESSMENT & PLAN:   88 y.o. 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 No abdominal or pelvic involvement.    03/20/18 CT  C/A/P revealed Progressive bilateral axillary and right inguinal lymphadenopathy, including necrotic lymph nodes in the right axilla, as above. Spleen is normal in size. 4 mm distal left ureteral calculus, unchanged. No hydronephrosis. Stable layering bladder calculi measuring up to 5 mm. Cholelithiasis, without associated inflammatory changes. Stable sequela of prior/chronic pancreatitis.   11/03/2018 CT neck revealed "Marked interval progression of cervical lymphadenopathy since PET-CT 09/02/2017, now with lymphadenopathy present bilaterally at essentially all stations. Index nodes as described. This includes bilateral intraparotid lymphadenopathy.  Bulky right-sided adenopathy results in multifocal narrowing of the right internal jugular vein. Significant narrowing of the mid to lower left internal jugular vein of unclear cause as there is no significant mass effect at this level."  11/03/2018 CT CAP revealed "1. Progression of bilateral axillary, retroperitoneal, pelvic, and inguinal adenopathy. New anterior and posterior lower cervical adenopathy identified within the neck. 2. Persistent distal left ureteral calculus. Small bladder calculi are unchanged. 3. Aortic Atherosclerosis (ICD10-I70.0). Coronary artery calcifications. 4. Sequelae of chronic pancreatitis noted."  10/01/2019 CT Abd/Pel ([id number]) revealed no evidence of mass in right kidney, new and enlarged abdominal and pelvic lymph nodes.   2. 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 3. H/o head and neck Basal cell carcinoma Continue routine skin checks with dermatologist    4. Renal indeterminate lesion -06/28/19 Renal US ([id number]) revealed New right-sided mass at the hilum which measures 1.5cm in size.   -resolved on subsequent scans   PLAN: -Discussed lab results from today, 11/25/2022, with the patient. CBC shows decreased hemoglobin of 10.7 g/dL and decreased hematocrit of 33.6%. CMP shows slightly elevated BUN at 25 and decreased AST  at 12, but stable overall.  -Recommend to wear compression socks and elevate the legs to improve leg swelling.  -Recommend to follow-up with his PCP regarding potassium medication and water pill.  -Continue with radiation oncologist.  -Answered all of patient's questions.   FOLLOW-UP: ***  The total time spent in the appointment was *** minutes* .  All of the patient's questions were answered with apparent satisfaction. The patient knows to call the clinic with any problems, questions or concerns.   Wyvonnia Lora MD MS AAHIVMS [facility] Hematology/Oncology Physician [facility]  .*Total Encounter Time as defined by the Centers for Medicare and Medicaid Services includes, in addition to the face-to-face time of a patient visit (documented in the note above) non-face-to-face time: obtaining and reviewing outside history, ordering and reviewing medications, tests or procedures, care coordination (communications with other health care professionals or caregivers) and documentation in the medical record.   I,Param Shah,acting as a Neurosurgeon for Wyvonnia Lora, MD.,have documented all relevant documentation on the behalf of Wyvonnia Lora, MD,as directed by  Wyvonnia Lora, MD while in the presence of Wyvonnia Lora, MD.

## 2022-11-26 ENCOUNTER — Ambulatory Visit
Admission: RE | Admit: 2022-11-26 | Discharge: 2022-11-26 | Disposition: A | Discharge status: Home / Self Care | Payer: Medicare Other | Source: Ambulatory Visit | Attending: Radiation Oncology | Admitting: Radiation Oncology

## 2022-11-26 ENCOUNTER — Other Ambulatory Visit: Payer: Self-pay

## 2022-11-26 DIAGNOSIS — C8201 Follicular lymphoma grade I, lymph nodes of head, face, and neck: Secondary | ICD-10-CM | POA: Diagnosis present

## 2022-11-26 DIAGNOSIS — Z510 Encounter for antineoplastic radiation therapy: Secondary | ICD-10-CM | POA: Diagnosis present

## 2022-11-26 LAB — RAD ONC ARIA SESSION SUMMARY
Course Elapsed Days: 1
Plan Fractions Treated to Date: 2
Plan Prescribed Dose Per Fraction: 2.5 Gy
Plan Total Fractions Prescribed: 10
Plan Total Prescribed Dose: 25 Gy
Reference Point Dosage Given to Date: 5 Gy
Reference Point Session Dosage Given: 2.5 Gy
Session Number: 2

## 2022-11-27 ENCOUNTER — Ambulatory Visit
Admission: RE | Admit: 2022-11-27 | Discharge: 2022-11-27 | Disposition: A | Discharge status: Home / Self Care | Payer: Medicare Other | Source: Ambulatory Visit | Attending: Radiation Oncology | Admitting: Radiation Oncology

## 2022-11-27 ENCOUNTER — Other Ambulatory Visit: Payer: Self-pay

## 2022-11-27 DIAGNOSIS — Z510 Encounter for antineoplastic radiation therapy: Secondary | ICD-10-CM | POA: Diagnosis not present

## 2022-11-27 LAB — RAD ONC ARIA SESSION SUMMARY
Course Elapsed Days: 2
Plan Fractions Treated to Date: 3
Plan Prescribed Dose Per Fraction: 2.5 Gy
Plan Total Fractions Prescribed: 10
Plan Total Prescribed Dose: 25 Gy
Reference Point Dosage Given to Date: 7.5 Gy
Reference Point Session Dosage Given: 2.5 Gy
Session Number: 3

## 2022-11-28 ENCOUNTER — Other Ambulatory Visit: Payer: Self-pay

## 2022-11-28 ENCOUNTER — Telehealth: Payer: Self-pay | Admitting: Hematology

## 2022-11-28 ENCOUNTER — Ambulatory Visit
Admission: RE | Admit: 2022-11-28 | Discharge: 2022-11-28 | Disposition: A | Discharge status: Home / Self Care | Payer: Medicare Other | Source: Ambulatory Visit | Attending: Radiation Oncology | Admitting: Radiation Oncology

## 2022-11-28 DIAGNOSIS — Z510 Encounter for antineoplastic radiation therapy: Secondary | ICD-10-CM | POA: Diagnosis not present

## 2022-11-28 LAB — RAD ONC ARIA SESSION SUMMARY
Course Elapsed Days: 3
Plan Fractions Treated to Date: 4
Plan Prescribed Dose Per Fraction: 2.5 Gy
Plan Total Fractions Prescribed: 10
Plan Total Prescribed Dose: 25 Gy
Reference Point Dosage Given to Date: 10 Gy
Reference Point Session Dosage Given: 2.5 Gy
Session Number: 4

## 2022-11-28 NOTE — Telephone Encounter (Signed)
Spoke with patients daughter regarding follow up appointment times/dates

## 2022-11-29 ENCOUNTER — Ambulatory Visit
Admission: RE | Admit: 2022-11-29 | Discharge: 2022-11-29 | Disposition: A | Discharge status: Home / Self Care | Payer: Medicare Other | Source: Ambulatory Visit | Attending: Radiation Oncology | Admitting: Radiation Oncology

## 2022-11-29 ENCOUNTER — Ambulatory Visit
Admission: RE | Admit: 2022-11-29 | Discharge: 2022-11-29 | Disposition: A | Discharge status: Home / Self Care | Payer: Medicare Other | Source: Ambulatory Visit | Attending: Radiation Oncology

## 2022-11-29 ENCOUNTER — Other Ambulatory Visit: Payer: Self-pay

## 2022-11-29 DIAGNOSIS — Z510 Encounter for antineoplastic radiation therapy: Secondary | ICD-10-CM | POA: Diagnosis not present

## 2022-11-29 LAB — RAD ONC ARIA SESSION SUMMARY
Course Elapsed Days: 4
Plan Fractions Treated to Date: 5
Plan Prescribed Dose Per Fraction: 2.5 Gy
Plan Total Fractions Prescribed: 10
Plan Total Prescribed Dose: 25 Gy
Reference Point Dosage Given to Date: 12.5 Gy
Reference Point Session Dosage Given: 2.5 Gy
Session Number: 5

## 2022-12-02 ENCOUNTER — Other Ambulatory Visit: Payer: Self-pay

## 2022-12-02 ENCOUNTER — Ambulatory Visit
Admission: RE | Admit: 2022-12-02 | Discharge: 2022-12-02 | Disposition: A | Discharge status: Home / Self Care | Payer: Medicare Other | Source: Ambulatory Visit | Attending: Radiation Oncology

## 2022-12-02 DIAGNOSIS — Z510 Encounter for antineoplastic radiation therapy: Secondary | ICD-10-CM | POA: Diagnosis not present

## 2022-12-02 LAB — RAD ONC ARIA SESSION SUMMARY
Course Elapsed Days: 7
Plan Fractions Treated to Date: 6
Plan Prescribed Dose Per Fraction: 2.5 Gy
Plan Total Fractions Prescribed: 10
Plan Total Prescribed Dose: 25 Gy
Reference Point Dosage Given to Date: 15 Gy
Reference Point Session Dosage Given: 2.5 Gy
Session Number: 6

## 2022-12-03 ENCOUNTER — Ambulatory Visit
Admission: RE | Admit: 2022-12-03 | Discharge: 2022-12-03 | Disposition: A | Discharge status: Home / Self Care | Payer: Medicare Other | Source: Ambulatory Visit | Attending: Radiation Oncology

## 2022-12-03 ENCOUNTER — Other Ambulatory Visit: Payer: Self-pay

## 2022-12-03 DIAGNOSIS — Z510 Encounter for antineoplastic radiation therapy: Secondary | ICD-10-CM | POA: Diagnosis not present

## 2022-12-03 LAB — RAD ONC ARIA SESSION SUMMARY
Course Elapsed Days: 8
Plan Fractions Treated to Date: 7
Plan Prescribed Dose Per Fraction: 2.5 Gy
Plan Total Fractions Prescribed: 10
Plan Total Prescribed Dose: 25 Gy
Reference Point Dosage Given to Date: 17.5 Gy
Reference Point Session Dosage Given: 2.5 Gy
Session Number: 7

## 2022-12-04 ENCOUNTER — Ambulatory Visit
Admission: RE | Admit: 2022-12-04 | Discharge: 2022-12-04 | Disposition: A | Discharge status: Home / Self Care | Payer: Medicare Other | Source: Ambulatory Visit | Attending: Radiation Oncology | Admitting: Radiation Oncology

## 2022-12-04 ENCOUNTER — Other Ambulatory Visit: Payer: Self-pay

## 2022-12-04 DIAGNOSIS — Z510 Encounter for antineoplastic radiation therapy: Secondary | ICD-10-CM | POA: Diagnosis not present

## 2022-12-04 LAB — RAD ONC ARIA SESSION SUMMARY
Course Elapsed Days: 9
Plan Fractions Treated to Date: 8
Plan Prescribed Dose Per Fraction: 2.5 Gy
Plan Total Fractions Prescribed: 10
Plan Total Prescribed Dose: 25 Gy
Reference Point Dosage Given to Date: 20 Gy
Reference Point Session Dosage Given: 2.5 Gy
Session Number: 8

## 2022-12-05 ENCOUNTER — Ambulatory Visit
Admission: RE | Admit: 2022-12-05 | Discharge: 2022-12-05 | Disposition: A | Discharge status: Home / Self Care | Payer: Medicare Other | Source: Ambulatory Visit | Attending: Radiation Oncology | Admitting: Radiation Oncology

## 2022-12-05 ENCOUNTER — Other Ambulatory Visit: Payer: Self-pay

## 2022-12-05 DIAGNOSIS — Z510 Encounter for antineoplastic radiation therapy: Secondary | ICD-10-CM | POA: Diagnosis not present

## 2022-12-05 LAB — RAD ONC ARIA SESSION SUMMARY
Course Elapsed Days: 10
Plan Fractions Treated to Date: 9
Plan Prescribed Dose Per Fraction: 2.5 Gy
Plan Total Fractions Prescribed: 10
Plan Total Prescribed Dose: 25 Gy
Reference Point Dosage Given to Date: 22.5 Gy
Reference Point Session Dosage Given: 2.5 Gy
Session Number: 9

## 2022-12-06 ENCOUNTER — Other Ambulatory Visit: Payer: Self-pay

## 2022-12-06 ENCOUNTER — Ambulatory Visit
Admission: RE | Admit: 2022-12-06 | Discharge: 2022-12-06 | Disposition: A | Discharge status: Home / Self Care | Payer: Medicare Other | Source: Ambulatory Visit | Attending: Radiation Oncology

## 2022-12-06 DIAGNOSIS — Z510 Encounter for antineoplastic radiation therapy: Secondary | ICD-10-CM | POA: Diagnosis not present

## 2022-12-06 LAB — RAD ONC ARIA SESSION SUMMARY
Course Elapsed Days: 11
Plan Fractions Treated to Date: 10
Plan Prescribed Dose Per Fraction: 2.5 Gy
Plan Total Fractions Prescribed: 10
Plan Total Prescribed Dose: 25 Gy
Reference Point Dosage Given to Date: 25 Gy
Reference Point Session Dosage Given: 2.5 Gy
Session Number: 10

## 2022-12-09 ENCOUNTER — Ambulatory Visit: Payer: Medicare Other

## 2022-12-09 NOTE — Radiation Completion Notes (Signed)
Radiation Oncology         [phone] ________________________________  Name: Harold [name] MRN: [id number]  Date of Service: 12/06/2022  DOB: 1934/12/27  End of Treatment Note     Diagnosis: Progressive low-grade follicular lymphoma   Intent: Palliative     ==========DELIVERED PLANS==========  First Treatment Date: 2022-11-25 - Last Treatment Date: 2022-12-06   Plan Name: [email or web address] Site: Groin, Right Technique: 3D Mode: Photon Dose Per Fraction: 2.5 Gy Prescribed Dose (Delivered / Prescribed): 25 Gy / 25 Gy Prescribed Fxs (Delivered / Prescribed): 10 / 10     ==========ON TREATMENT VISIT DATES========== 2022-11-29, 2022-12-06   See weekly On Treatment Notes in Epic for details. The patient tolerated radiation. He continued to have lower extremity edema at the conclusion of therapy.  The patient will receive a call in about one month from the radiation oncology department. He will continue follow up with Dr. Candise Che as well.      Osker Mason, PAC

## 2022-12-10 ENCOUNTER — Ambulatory Visit: Payer: Medicare Other

## 2022-12-22 DIAGNOSIS — R600 Localized edema: Secondary | ICD-10-CM | POA: Insufficient documentation

## 2022-12-22 DIAGNOSIS — Z8619 Personal history of other infectious and parasitic diseases: Secondary | ICD-10-CM | POA: Insufficient documentation

## 2022-12-22 NOTE — Progress Notes (Unsigned)
No chief complaint on file.  History of Present Illness: 88 yo male with history of CAD, HTN, follicular lymphoma and HLD here today for cardiac follow up. He underwent catheterization in October 2011 because of chest pain and was found to have a severe stenosis in a large posterolateral branch of the right coronary artery and a severe stenosis in the proximal LAD with complete occlusion of the distal LAD. Drug eluting stents were placed in the posterior lateral branch of the right coronary and the mid LAD in October 2011. He was diagnosed with lymphoma in March 2019. This was found when he had a mass on his neck. This was treated with resection. He completed chemotherapy. Echo January 2020 with LVEF=60-65%. No significant valve disease. He was seen in my office February 2022 and c/o bilateral leg swelling with left worse than the right. Venous dopplers February 2022 with peroneal vein thrombosis. He was started on Eliquis and completed six months of therapy. He was admitted to [facility] in February 2023 with chest pain, mild troponin elevation. Outpatient stress test 04/27/21 with no large areas of ischemia. He was admitted to [facility] in September 2024 with septic shock from cellulitis. Echo September 2024 with LVEF=60-65%. Mild MR. Mild mitral stenosis.   He is here today for follow up. The patient denies any chest pain, dyspnea, palpitations, lower extremity edema, orthopnea, PND, dizziness, near syncope or syncope.      Primary Care Physician: Barron Alvine, MD  Past Medical History:  Diagnosis Date   BPH (benign prostatic hypertrophy)    CAD (coronary artery disease) 01/2010   S/P drug eluting stents LAD and PL branch of RCA Dr. 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   follicular lymphoma dx'd 06/2017   Hernia    HLD (hyperlipidemia)    HTN (hypertension)     Past Surgical History:  Procedure Laterality Date   BACK SURGERY      CYSTOSCOPY WITH LITHOLAPAXY N/A 05/20/2019   Procedure: CYSTOSCOPY WITH LITHOLAPAXY;  Surgeon: Crist Fat, MD;  Location: [facility];  Service: Urology;  Laterality: N/A;   CYSTOSCOPY/URETEROSCOPY/HOLMIUM LASER/STENT PLACEMENT Left 05/20/2019   Procedure: CYSTOSCOPY/URETEROSCOPY/HOLMIUM LASER/STENT PLACEMENT;  Surgeon: Crist Fat, MD;  Location: [facility];  Service: Urology;  Laterality: Left;   HERNIA REPAIR  01/08/11   RIH   MASS EXCISION Right 08/05/2017   Procedure: EXCISION RIGHT POSTERIOR  NECK LYMPH NODE;  Surgeon: Drema Halon, MD;  Location: [facility];  Service: ENT;  Laterality: Right;    Current Outpatient Medications  Medication Sig Dispense Refill   aspirin EC 81 MG tablet Take 1 tablet (81 mg total) by mouth daily. Swallow whole. 90 tablet 3   atenolol (TENORMIN) 25 MG tablet Take 1 tablet (25 mg total) by mouth daily. 90 tablet 3   brimonidine (ALPHAGAN) 0.2 % ophthalmic solution Place 1 drop into the right eye 2 (two) times daily.     cholecalciferol (VITAMIN D) 1000 UNITS tablet Take 1,000 Units by mouth daily.     Cyanocobalamin (B-12) 2500 MCG TABS Take 2,500 mcg by mouth daily.     finasteride (PROSCAR) 5 MG tablet Take 5 mg by mouth daily.     furosemide (LASIX) 20 MG tablet TAKE 1 TABLET BY MOUTH TWICE  DAILY 180 tablet 3   gabapentin (NEURONTIN) 100 MG capsule Take 1 capsule by mouth 2 (two) times daily as needed (for knee pain).  nitroGLYCERIN (NITROSTAT) 0.4 MG SL tablet Place 1 tablet (0.4 mg total) under the tongue every 5 (five) minutes as needed for chest pain. 25 tablet 6   potassium chloride SA (KLOR-CON M) 20 MEQ tablet Take 1 tablet (20 mEq total) by mouth daily. Please keep upcoming appointment for future refills. Thank you.     pravastatin (PRAVACHOL) 40 MG tablet Take 1 tablet (40 mg total) by mouth at bedtime. 90 tablet 3   No current facility-administered medications for this visit.    No Known Allergies  Social  History   Socioeconomic History   Marital status: Married    Spouse name: Not on file   Number of children: Not on file   Years of education: Not on file   Highest education level: Not on file  Occupational History   Not on file  Tobacco Use   Smoking status: Never   Smokeless tobacco: Never  Vaping Use   Vaping status: Never Used  Substance and Sexual Activity   Alcohol use: No   Drug use: No   Sexual activity: Not on file  Other Topics Concern   Not on file  Social History Narrative   Married   Social Determinants of Health   Financial Resource Strain: Low Risk  (11/17/2022)   Received from [facility]   Overall Financial Resource Strain (CARDIA)    Difficulty of Paying Living Expenses: Not very hard  Food Insecurity: No Food Insecurity (11/17/2022)   Hunger Vital Sign    Worried About Running Out of Food in the Last Year: Never true    Ran Out of Food in the Last Year: Never true  Transportation Needs: No Transportation Needs (11/17/2022)   PRAPARE - Administrator, Civil Service (Medical): No    Lack of Transportation (Non-Medical): No  Physical Activity: Inactive (11/17/2022)   Received from [facility]   Exercise Vital Sign    Days of Exercise per Week: 0 days    Minutes of Exercise per Session: 0 min  Stress: Stress Concern Present (11/17/2022)   Received from [facility] - Occupational Stress Questionnaire    Feeling of Stress : Rather much  Social Connections: Moderately Isolated (11/17/2022)   Received from [facility]   Social Connection and Isolation Panel [NHANES]    Frequency of Communication with Friends and Family: More than three times a week    Frequency of Social Gatherings with Friends and Family: More than three times a week    Attends Religious Services: More than 4 times per year    Active Member of [employer] or Organizations: No    Attends Banker Meetings: Never     Marital Status: Widowed  Intimate Partner Violence: Not At Risk (11/17/2022)   Humiliation, Afraid, Rape, and Kick questionnaire    Fear of Current or Ex-Partner: No    Emotionally Abused: No    Physically Abused: No    Sexually Abused: No    Family History  Problem Relation Age of Onset   Heart failure Mother     Review of Systems:  As stated in the HPI and otherwise negative.   There were no vitals taken for this visit.  Physical Examination: General: Well developed, well nourished, NAD  HEENT: OP clear, mucus membranes moist  SKIN: warm, dry. No rashes. Neuro: No focal deficits  Musculoskeletal: Muscle strength 5/5 all ext  Psychiatric: Mood and affect normal  Neck: No JVD, no carotid bruits, no thyromegaly, no lymphadenopathy.  Lungs:Clear bilaterally, no wheezes, rhonci, crackles Cardiovascular: Regular rate and rhythm. No murmurs, gallops or rubs. Abdomen:Soft. Bowel sounds present. Non-tender.  Extremities: No lower extremity edema. Pulses are 2 + in the bilateral DP/PT.  EKG:  EKG is *** ordered today. The ekg ordered today demonstrates   Recent Labs: 11/20/2022: Magnesium 1.9 11/25/2022: ALT 10; BUN 25; Creatinine 0.91; Hemoglobin 10.7; Platelet Count 316; Potassium 3.7; Sodium 141   Lipid Panel    Component Value Date/Time   CHOL 129 12/26/2021 0929   TRIG 52 12/26/2021 0929   HDL 78 12/26/2021 0929   CHOLHDL 1.7 12/26/2021 0929   LDLCALC 39 12/26/2021 0929     Wt Readings from Last 3 Encounters:  11/25/22 76.6 kg  11/23/22 79.2 kg  05/27/22 74.1 kg    Assessment and Plan:   1. CAD without angina: No chest pain suggestive of angina. Stress test in February 2023 was low risk with no large areas of ischemia. Echo September 2024 with normal LV function. Will continue ASA, beta blocker, statin and Imdur. Of note, he was a non-responder to Plavix.   2. HTN: BP is well controlled. No changes  3. Hyperlipidemia: LDL 39 in October 2023. Will continue the statin.     4. Chronic diastolic CHF: Weight is stable. Continue Lasix.   Labs/ tests ordered today include:   No orders of the defined types were placed in this encounter.  Disposition:   F/U with me in 12 months.   Signed, Verne Carrow, MD 12/22/2022 5:06 PM    [facility] [address] [place], [place], [place]  [address] Phone: [phone]; Fax: [phone]

## 2022-12-23 ENCOUNTER — Encounter: Payer: Self-pay | Admitting: Cardiovascular Disease

## 2022-12-23 ENCOUNTER — Ambulatory Visit: Payer: Medicare Other | Attending: Cardiovascular Disease | Admitting: Cardiovascular Disease

## 2022-12-23 VITALS — BP 114/60 | HR 75 | Ht 69.5 in | Wt 157.2 lb

## 2022-12-23 DIAGNOSIS — I2510 Atherosclerotic heart disease of native coronary artery without angina pectoris: Secondary | ICD-10-CM | POA: Diagnosis not present

## 2022-12-23 DIAGNOSIS — I5032 Chronic diastolic (congestive) heart failure: Secondary | ICD-10-CM

## 2022-12-23 DIAGNOSIS — I10 Essential (primary) hypertension: Secondary | ICD-10-CM | POA: Diagnosis not present

## 2022-12-23 DIAGNOSIS — R600 Localized edema: Secondary | ICD-10-CM

## 2022-12-23 DIAGNOSIS — E785 Hyperlipidemia, unspecified: Secondary | ICD-10-CM | POA: Diagnosis not present

## 2022-12-23 MED ORDER — ATENOLOL 25 MG PO TABS: 25.0000 mg | ORAL_TABLET | Freq: Every day | ORAL | 3 refills | Status: DC

## 2022-12-23 NOTE — Patient Instructions (Addendum)
Medication Instructions:  1.) stop aspirin 2.) stay off lisinopril 3.) continue all other medications as directed   *If you need a refill on your cardiac medications before your next appointment, please call your pharmacy*   Lab Work: none   Testing/Procedures: none   Follow-Up: At [facility], you and your health needs are our priority.  As part of our continuing mission to provide you with exceptional heart care, we have created designated Provider Care Teams.  These Care Teams include your primary Cardiologist (physician) and Advanced Practice Providers (APPs -  Physician Assistants and Nurse Practitioners) who all work together to provide you with the care you need, when you need it.  We recommend signing up for the patient portal called "MyChart".  Sign up information is provided on this After Visit Summary.  MyChart is used to connect with patients for Virtual Visits (Telemedicine).  Patients are able to view lab/test results, encounter notes, upcoming appointments, etc.  Non-urgent messages can be sent to your provider as well.   To learn more about what you can do with MyChart, go to [email or web address].    Your next appointment:   12 month(s)  Provider:   Verne Carrow, MD

## 2022-12-24 ENCOUNTER — Ambulatory Visit (INDEPENDENT_AMBULATORY_CARE_PROVIDER_SITE_OTHER): Payer: Medicare Other | Admitting: Podiatry

## 2022-12-24 ENCOUNTER — Encounter: Payer: Self-pay | Admitting: Podiatry

## 2022-12-24 DIAGNOSIS — L600 Ingrowing nail: Secondary | ICD-10-CM

## 2022-12-24 NOTE — Patient Instructions (Signed)

## 2022-12-24 NOTE — Progress Notes (Signed)
Subjective:  Patient ID: Harold [name], male    DOB: 20-Sep-1934,  MRN: [id number]  Chief Complaint  Patient presents with   Ingrown Toenail    Right great toenail medial border, bothering him for several weeks. Has improved somewhat    88 y.o. male presents with concern for ingrown nail to the right great toe medial border.  Bothering him for several weeks.  Says it is getting slightly better he has been on the extended course of oral antibiotics for concern for cellulitis in the right lower extremity  Past Medical History:  Diagnosis Date   BPH (benign prostatic hypertrophy)    CAD (coronary artery disease) 01/2010   S/P drug eluting stents LAD and PL branch of RCA Dr. Juanda Chance   DVT (deep venous thrombosis) [facility])    Peroneal vein thrombus February 2022   follicular lymphoma dx'd 06/2017   Hernia    HLD (hyperlipidemia)    HTN (hypertension)     No Known Allergies  ROS: Negative except as per HPI above  Objective:  General: AAO x3, NAD  Dermatological: Incurvation is present distally along the medial nail border of the right toe. There is localized edema without any erythema or increase in warmth around the nail border. There is no drainage or pus. There is no ascending cellulitis. No malodor. No open lesions or pre-ulcerative lesions.    Vascular:  Dorsalis Pedis artery and Posterior Tibial artery pedal pulses are 2/4 bilateral.  Capillary fill time < 3 sec to all digits.   Neruologic: Grossly intact via light touch bilateral. Protective threshold intact to all sites bilateral.   Musculoskeletal: No gross boney pedal deformities bilateral. No pain, crepitus, or limitation noted with foot and ankle range of motion bilateral. Muscular strength 5/5 in all groups tested bilateral.  Gait: Unassisted, Nonantalgic.   No images are attached to the encounter.   Assessment:   1. Ingrown nail of great toe of right foot      Plan:  Patient was evaluated and treated and all  questions answered.  # Ingrown nail right great toe -Discussed with patient that there is a distal piece of nail that is ingrowing on the medial border -Performed aggressive slant back procedure on the affected border with relief of the patient's pain -Recommend Epsom salt soaks as well as antibiotic ointment applied daily to the distal medial aspect of the right hallux nail border for 1 week -Discussed that the issue is not fully improved after another 2 to 3 weeks he should call again back and we will consider phenol and alcohol matricectomy of the medial border -No current indication for antibiotics however the patient develops any redness drainage or systemic signs of infection should call or go to the emergency department for further evaluation.  Return if symptoms worsen or fail to improve.          Corinna Gab, DPM [facility]

## 2022-12-31 NOTE — Telephone Encounter (Signed)
TC

## 2023-01-03 ENCOUNTER — Other Ambulatory Visit: Payer: Self-pay

## 2023-01-03 DIAGNOSIS — C8208 Follicular lymphoma grade I, lymph nodes of multiple sites: Secondary | ICD-10-CM

## 2023-01-06 ENCOUNTER — Inpatient Hospital Stay: Payer: Medicare Other

## 2023-01-06 ENCOUNTER — Other Ambulatory Visit: Payer: Self-pay

## 2023-01-06 ENCOUNTER — Inpatient Hospital Stay: Payer: Medicare Other | Attending: Hematology | Admitting: Hematology

## 2023-01-06 VITALS — BP 135/64 | HR 87 | Temp 97.9°F | Resp 18 | Wt 159.4 lb

## 2023-01-06 DIAGNOSIS — N201 Calculus of ureter: Secondary | ICD-10-CM | POA: Diagnosis not present

## 2023-01-06 DIAGNOSIS — Z7901 Long term (current) use of anticoagulants: Secondary | ICD-10-CM | POA: Insufficient documentation

## 2023-01-06 DIAGNOSIS — Z8249 Family history of ischemic heart disease and other diseases of the circulatory system: Secondary | ICD-10-CM | POA: Insufficient documentation

## 2023-01-06 DIAGNOSIS — Z86718 Personal history of other venous thrombosis and embolism: Secondary | ICD-10-CM | POA: Insufficient documentation

## 2023-01-06 DIAGNOSIS — C8208 Follicular lymphoma grade I, lymph nodes of multiple sites: Secondary | ICD-10-CM | POA: Diagnosis not present

## 2023-01-06 DIAGNOSIS — I10 Essential (primary) hypertension: Secondary | ICD-10-CM | POA: Diagnosis not present

## 2023-01-06 DIAGNOSIS — Z510 Encounter for antineoplastic radiation therapy: Secondary | ICD-10-CM | POA: Diagnosis present

## 2023-01-06 DIAGNOSIS — I2510 Atherosclerotic heart disease of native coronary artery without angina pectoris: Secondary | ICD-10-CM | POA: Insufficient documentation

## 2023-01-06 DIAGNOSIS — I871 Compression of vein: Secondary | ICD-10-CM | POA: Diagnosis not present

## 2023-01-06 DIAGNOSIS — I700 Atherosclerosis of aorta: Secondary | ICD-10-CM | POA: Insufficient documentation

## 2023-01-06 DIAGNOSIS — Z85828 Personal history of other malignant neoplasm of skin: Secondary | ICD-10-CM | POA: Insufficient documentation

## 2023-01-06 DIAGNOSIS — N210 Calculus in bladder: Secondary | ICD-10-CM | POA: Insufficient documentation

## 2023-01-06 DIAGNOSIS — E785 Hyperlipidemia, unspecified: Secondary | ICD-10-CM | POA: Diagnosis not present

## 2023-01-06 DIAGNOSIS — K861 Other chronic pancreatitis: Secondary | ICD-10-CM | POA: Insufficient documentation

## 2023-01-06 DIAGNOSIS — Z85820 Personal history of malignant melanoma of skin: Secondary | ICD-10-CM | POA: Insufficient documentation

## 2023-01-06 DIAGNOSIS — C8201 Follicular lymphoma grade I, lymph nodes of head, face, and neck: Secondary | ICD-10-CM | POA: Insufficient documentation

## 2023-01-06 DIAGNOSIS — Z79899 Other long term (current) drug therapy: Secondary | ICD-10-CM | POA: Diagnosis not present

## 2023-01-06 LAB — CMP (CANCER CENTER ONLY)
ALT: 6 U/L (ref 0–44)
AST: 11 U/L — ABNORMAL LOW (ref 15–41)
Albumin: 3.9 g/dL (ref 3.5–5.0)
Alkaline Phosphatase: 46 U/L (ref 38–126)
Anion gap: 6 (ref 5–15)
BUN: 22 mg/dL (ref 8–23)
CO2: 29 mmol/L (ref 22–32)
Calcium: 9.5 mg/dL (ref 8.9–10.3)
Chloride: 106 mmol/L (ref 98–111)
Creatinine: 0.92 mg/dL (ref 0.61–1.24)
GFR, Estimated: >60 mL/min (ref >60)
Glucose, Bld: 94 mg/dL (ref 70–99)
Potassium: 3.8 mmol/L (ref 3.5–5.1)
Sodium: 141 mmol/L (ref 135–145)
Total Bilirubin: 0.8 mg/dL (ref 0.3–1.2)
Total Protein: 6.6 g/dL (ref 6.5–8.1)

## 2023-01-06 LAB — CBC WITH DIFFERENTIAL (CANCER CENTER ONLY)
Abs Immature Granulocytes: 0.03 10*3/uL (ref 0.00–0.07)
Basophils Absolute: 0 10*3/uL (ref 0.0–0.1)
Basophils Relative: 0 %
Eosinophils Absolute: 0.1 10*3/uL (ref 0.0–0.5)
Eosinophils Relative: 2 %
HCT: 32 % — ABNORMAL LOW (ref 39.0–52.0)
Hemoglobin: 10.6 g/dL — ABNORMAL LOW (ref 13.0–17.0)
Immature Granulocytes: 1 %
Lymphocytes Relative: 15 %
Lymphs Abs: 0.8 10*3/uL (ref 0.7–4.0)
MCH: 28.6 pg (ref 26.0–34.0)
MCHC: 33.1 g/dL (ref 30.0–36.0)
MCV: 86.3 fL (ref 80.0–100.0)
Monocytes Absolute: 0.9 10*3/uL (ref 0.1–1.0)
Monocytes Relative: 16 %
Neutro Abs: 3.8 10*3/uL (ref 1.7–7.7)
Neutrophils Relative %: 66 %
Platelet Count: 255 10*3/uL (ref 150–400)
RBC: 3.71 MIL/uL — ABNORMAL LOW (ref 4.22–5.81)
RDW: 13.4 % (ref 11.5–15.5)
WBC Count: 5.7 10*3/uL (ref 4.0–10.5)
nRBC: 0 % (ref 0.0–0.2)

## 2023-01-06 LAB — LACTATE DEHYDROGENASE: LDH: 129 U/L (ref 98–192)

## 2023-01-06 NOTE — Progress Notes (Signed)
HEMATOLOGY/ONCOLOGY CLINIC NOTE  Date of Service: 01/06/2023  Patient Care Team: Barron Alvine, MD as PCP - General (Family Medicine) Kathleene Hazel, MD as PCP - Cardiology (Cardiology)  CHIEF COMPLAINTS/PURPOSE OF CONSULTATION:  Follow-up for continued evaluation and management of low-grade follicular lymphoma   HISTORY OF PRESENTING ILLNESS:  please see previous notes for details on initial presentation.  INTERVAL HISTORY:  Harold [name] is here for continued evaluation and management of his low-grade follicular lymphoma.  Patient was last seen by me on 11/25/2022 and he complained of worsened left left leg swelling and scrotal swelling. Patient was recently admitted to the ED on 12/13/2022 for Cellulitis. He was started on two different antibiotics.   Patient is accompanied by his daughter during this visit. He notes he has been doing well overall since our last visit. Patient notes his scrotal swelling has improved after finishing his radiation treatment. However, he still has leg swelling, but it has improved. Patient's last radiation treatment session was around 1 month ago.  Patient notes he had a visit with his PCP in september and had an ultrasound of his legs, which did not show DVT.    He denies night sweats, abdominal pain, chest pain, back pain, abnormal bowel movement, or leg swelling. He did have cellulitis with fever on 12/13/2022. He denies fever during this visit, but complains of chills. He also complains of neck pain, which he attributes to arthritis. He also notes that there is a small bump near his right neck.   Patient has been wearing compression socks and has been elevating his legs to reduce leg swelling.   Patient notes he has been eating well.   Patient's next visit with PCP is on Oct. 31st.   MEDICAL HISTORY:  Past Medical History:  Diagnosis Date   BPH (benign prostatic hypertrophy)    CAD (coronary artery disease) 01/2010   S/P  drug eluting stents LAD and PL branch of RCA Dr. 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   follicular lymphoma dx'd 06/2017   Hernia    HLD (hyperlipidemia)    HTN (hypertension)     SURGICAL HISTORY: Past Surgical History:  Procedure Laterality Date   BACK SURGERY     CYSTOSCOPY WITH LITHOLAPAXY N/A 05/20/2019   Procedure: CYSTOSCOPY WITH LITHOLAPAXY;  Surgeon: Crist Fat, MD;  Location: [facility];  Service: Urology;  Laterality: N/A;   CYSTOSCOPY/URETEROSCOPY/HOLMIUM LASER/STENT PLACEMENT Left 05/20/2019   Procedure: CYSTOSCOPY/URETEROSCOPY/HOLMIUM LASER/STENT PLACEMENT;  Surgeon: Crist Fat, MD;  Location: [facility];  Service: Urology;  Laterality: Left;   HERNIA REPAIR  01/08/11   RIH   MASS EXCISION Right 08/05/2017   Procedure: EXCISION RIGHT POSTERIOR  NECK LYMPH NODE;  Surgeon: Drema Halon, MD;  Location: [facility];  Service: ENT;  Laterality: Right;    SOCIAL HISTORY: Social History   Socioeconomic History   Marital status: Married    Spouse name: Not on file   Number of children: Not on file   Years of education: Not on file   Highest education level: Not on file  Occupational History   Not on file  Tobacco Use   Smoking status: Never   Smokeless tobacco: Never  Vaping Use   Vaping status: Never Used  Substance and Sexual Activity   Alcohol use: No   Drug use: No   Sexual activity: Not on file  Other Topics Concern  Not on file  Social History Narrative   Married   Social Determinants of Health   Financial Resource Strain: Low Risk  (11/17/2022)   Received from [facility]   Overall Financial Resource Strain (CARDIA)    Difficulty of Paying Living Expenses: Not very hard  Food Insecurity: No Food Insecurity (11/17/2022)   Hunger Vital Sign    Worried About Running Out of Food in the Last Year: Never true    Ran Out of Food in the Last Year: Never true  Transportation Needs:  No Transportation Needs (11/17/2022)   PRAPARE - Administrator, Civil Service (Medical): No    Lack of Transportation (Non-Medical): No  Physical Activity: Inactive (11/17/2022)   Received from [facility]   Exercise Vital Sign    Days of Exercise per Week: 0 days    Minutes of Exercise per Session: 0 min  Stress: Stress Concern Present (11/17/2022)   Received from [facility] of Occupational Health - Occupational Stress Questionnaire    Feeling of Stress : Rather much  Social Connections: Moderately Isolated (11/17/2022)   Received from [facility]   Social Connection and Isolation Panel [NHANES]    Frequency of Communication with Friends and Family: More than three times a week    Frequency of Social Gatherings with Friends and Family: More than three times a week    Attends Religious Services: More than 4 times per year    Active Member of [employer] or Organizations: No    Attends Banker Meetings: Never    Marital Status: Widowed  Intimate Partner Violence: Not At Risk (11/17/2022)   Humiliation, Afraid, Rape, and Kick questionnaire    Fear of Current or Ex-Partner: No    Emotionally Abused: No    Physically Abused: No    Sexually Abused: No    FAMILY HISTORY: Family History  Problem Relation Age of Onset   Heart failure Mother     ALLERGIES:  has No Known Allergies.  MEDICATIONS:  Current Outpatient Medications  Medication Sig Dispense Refill   apixaban (ELIQUIS) 5 MG TABS tablet Take 5 mg by mouth 2 (two) times daily.     atenolol (TENORMIN) 25 MG tablet Take 1 tablet (25 mg total) by mouth daily. 90 tablet 3   brimonidine (ALPHAGAN) 0.2 % ophthalmic solution Place 1 drop into the right eye 2 (two) times daily.     cholecalciferol (VITAMIN D) 1000 UNITS tablet Take 1,000 Units by mouth daily.     Cyanocobalamin (B-12) 2500 MCG TABS Take 2,500 mcg by mouth daily.     finasteride (PROSCAR) 5 MG tablet Take 5 mg by mouth  daily.     furosemide (LASIX) 20 MG tablet TAKE 1 TABLET BY MOUTH TWICE  DAILY 180 tablet 3   gabapentin (NEURONTIN) 100 MG capsule Take 1 capsule by mouth 2 (two) times daily as needed (for knee pain).     nitroGLYCERIN (NITROSTAT) 0.4 MG SL tablet Place 1 tablet (0.4 mg total) under the tongue every 5 (five) minutes as needed for chest pain. 25 tablet 6   potassium chloride SA (KLOR-CON M) 20 MEQ tablet Take 1 tablet (20 mEq total) by mouth daily. Please keep upcoming appointment for future refills. Thank you.     pravastatin (PRAVACHOL) 40 MG tablet Take 1 tablet (40 mg total) by mouth at bedtime. 90 tablet 3   No current facility-administered medications for this visit.  REVIEW OF SYSTEMS:   10 Point review of Systems was done is negative except as noted above.  PHYSICAL EXAMINATION: ECOG FS:2 - Symptomatic, <50% confined to bed  Vitals:   01/06/23 1407  BP: 135/64  Pulse: 87  Resp: 18  Temp: 97.9 F (36.6 C)   Wt Readings from Last 3 Encounters:  12/23/22 157 lb 3.2 oz (71.3 kg)  11/25/22 168 lb 14.4 oz (76.6 kg)  11/23/22 174 lb 9.7 oz (79.2 kg)   Body mass index is 23.2 kg/m.   NAD GENERAL:alert, in no acute distress and comfortable SKIN: no acute rashes, no significant lesions EYES: conjunctiva are pink and non-injected, sclera anicteric NECK: supple, no JVD LYMPH:  1-2 cm lymph node in right groin. LUNGS: clear to auscultation b/l with normal respiratory effort HEART: regular rate & rhythm ABDOMEN:  normoactive bowel sounds , non tender, not distended. Extremity: Right lower extremity 3+ pedal edema.  No redness or tenderness to palpation at this time.  Left lower extremity 1+ pitting pedal edema. PSYCH: alert & oriented x 3 with fluent speech NEURO: no focal motor/sensory deficits  LABORATORY DATA:  I have reviewed the data as listed     Latest Ref Rng & Units 01/06/2023    1:07 PM 11/25/2022   11:21 AM 11/20/2022    4:13 AM  CBC  WBC 4.0 - 10.5 K/uL 5.7   10.6  6.5   Hemoglobin 13.0 - 17.0 g/dL 95.2  84.1  32.4   Hematocrit 39.0 - 52.0 % 32.0  33.6  31.8   Platelets 150 - 400 K/uL 255  316  197        Latest Ref Rng & Units 01/06/2023    1:07 PM 11/25/2022   11:21 AM 11/20/2022    4:13 AM  CMP  Glucose 70 - 99 mg/dL 94  94  93   BUN 8 - 23 mg/dL 22  25  16    Creatinine 0.61 - 1.24 mg/dL 4.01  0.27  2.53   Sodium 135 - 145 mmol/L 141  141  139   Potassium 3.5 - 5.1 mmol/L 3.8  3.7  3.7   Chloride 98 - 111 mmol/L 106  105  107   CO2 22 - 32 mmol/L 29  28  25    Calcium 8.9 - 10.3 mg/dL 9.5  9.2  8.8   Total Protein 6.5 - 8.1 g/dL 6.6  6.8    Total Bilirubin 0.3 - 1.2 mg/dL 0.8  0.9    Alkaline Phos 38 - 126 U/L 46  51    AST 15 - 41 U/L 11  12    ALT 0 - 44 U/L 6  10     Lab Results  Component Value Date   LDH 129 01/06/2023     02/18/2019 CT Abdomen Pelvis W Contrast (Accession [id number]):   02/18/2019 CT Chest W Contrast (Accession [id number]):  08/08/17 Pathology:   08/05/17 Pathology:     RADIOGRAPHIC STUDIES: I have personally reviewed the radiological images as listed and agreed with the findings in the report. No results found.   ASSESSMENT & PLAN:   88 y.o. 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 No abdominal or pelvic involvement.    03/20/18 CT C/A/P revealed  Progressive bilateral axillary and right inguinal lymphadenopathy, including necrotic lymph nodes in the right axilla, as above. Spleen is normal in size. 4 mm distal left ureteral calculus, unchanged. No hydronephrosis. Stable layering bladder calculi measuring up to 5 mm. Cholelithiasis, without associated inflammatory changes. Stable sequela of  prior/chronic pancreatitis.   11/03/2018 CT neck revealed "Marked interval progression of cervical lymphadenopathy since PET-CT 09/02/2017, now with lymphadenopathy present bilaterally at essentially all stations. Index nodes as described. This includes bilateral intraparotid lymphadenopathy. Bulky right-sided adenopathy results in multifocal narrowing of the right internal jugular vein. Significant narrowing of the mid to lower left internal jugular vein of unclear cause as there is no significant mass effect at this level."  11/03/2018 CT CAP revealed "1. Progression of bilateral axillary, retroperitoneal, pelvic, and inguinal adenopathy. New anterior and posterior lower cervical adenopathy identified within the neck. 2. Persistent distal left ureteral calculus. Small bladder calculi are unchanged. 3. Aortic Atherosclerosis (ICD10-I70.0). Coronary artery calcifications. 4. Sequelae of chronic pancreatitis noted."  10/01/2019 CT Abd/Pel ([id number]) revealed no evidence of mass in right kidney, new and enlarged abdominal and pelvic lymph nodes.   2. 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 3. H/o head and neck Basal cell carcinoma Continue routine skin checks with dermatologist    4. Renal indeterminate lesion -06/28/19 Renal US ([id number]) revealed New right-sided mass at the hilum which measures 1.5cm in size.   -resolved on subsequent scans   PLAN: -Discussed lab results from today, 01/06/2023, in detail with the patient. CBC shows low hemoglobin of 10.6 g/dL and low hematocrit of 43.3%, stable overall for the patient. CMP is stable. LDH is  wnl 129 -Continue to wear compression socks and elevate the legs to improve leg swelling.  - s/p palliative RT to rt inguinal area/pelvis. -no clear indication for additional systemic treatment at this time. -Answered all of patient's questions.  -Repeat CT scan in 1-2 months.   FOLLOW-UP: CT chest/abd/pelvis in 6 weeks RTC with Dr Candise Che with labs in 8  weeks  The total time spent in the appointment was 30 minutes* .  All of the patient's questions were answered with apparent satisfaction. The patient knows to call the clinic with any problems, questions or concerns.   Wyvonnia Lora MD MS AAHIVMS [facility] Hematology/Oncology Physician [facility]  .*Total Encounter Time as defined by the Centers for Medicare and Medicaid Services includes, in addition to the face-to-face time of a patient visit (documented in the note above) non-face-to-face time: obtaining and reviewing outside history, ordering and reviewing medications, tests or procedures, care coordination (communications with other health care professionals or caregivers) and documentation in the medical record.   I,Param Shah,acting as a Neurosurgeon for Wyvonnia Lora, MD.,have documented all relevant documentation on the behalf of Wyvonnia Lora, MD,as directed by  Wyvonnia Lora, MD while in the presence of Wyvonnia Lora, MD.   .I have reviewed the above documentation for accuracy and completeness, and I agree with the above. Johney Maine MD

## 2023-01-08 ENCOUNTER — Telehealth: Payer: Self-pay | Admitting: Hematology

## 2023-01-08 NOTE — Telephone Encounter (Signed)
Per Candise Che 10/21 los patient's daughter is aware of scheduled appointment times/dates

## 2023-01-21 IMAGING — CT CT CHEST-ABD-PELV W/ CM
5 of 10 series · 13 of 36 positions shown, 17 images · IV contrast (OMNIPAQUE)
Comparison: CT abdomen and pelvis October 01, 2019 and CT chest
abdomen pelvis February 18, 2019

CLINICAL DATA: Hematologic malignancy, surveillance with clinical
progression of follicular lymphoma for pre treatment restaging.

EXAM:
CT CHEST, ABDOMEN, AND PELVIS WITH CONTRAST
TECHNIQUE: Multidetector CT imaging of the chest, abdomen and pelvis was
performed following the standard protocol during bolus
administration of intravenous contrast.
CONTRAST:  100mL OMNIPAQUE IOHEXOL 300 MG/ML  SOLN

[Series 2: cap with · axial · 0.77mm/px · z∈[-661,-451]mm · 2 of 128 slices shown, 5 images]
[im 43/128  mediastinal]
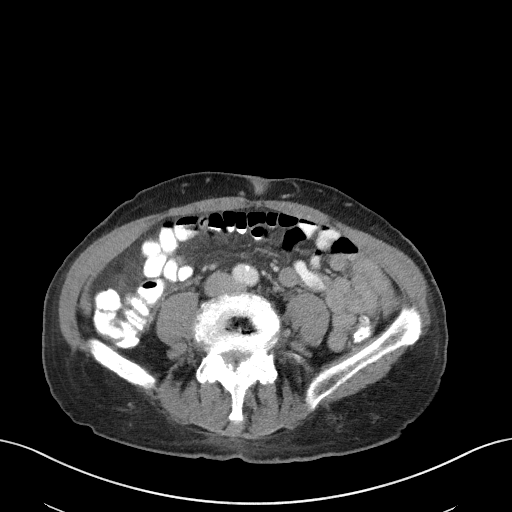
[im 43/128  lung]
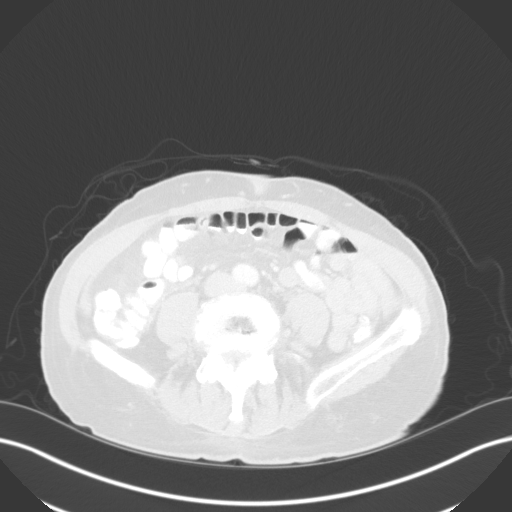
[im 43/128  bone]
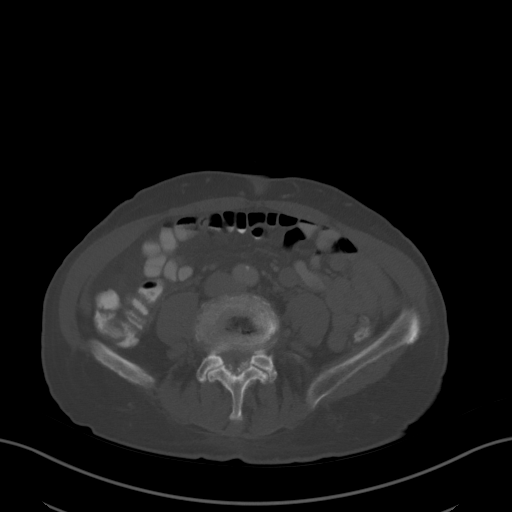
[im 85/128  mediastinal]
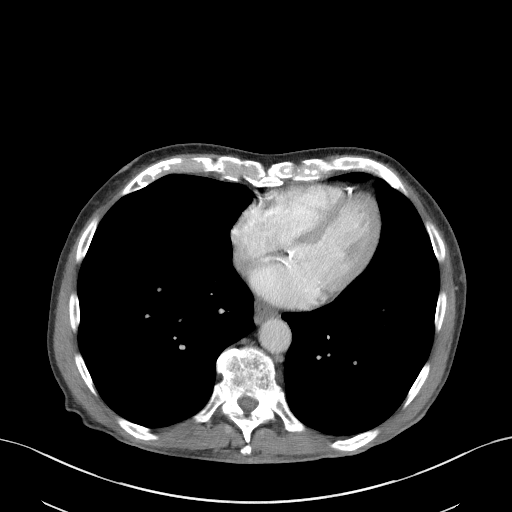
[im 85/128  lung]
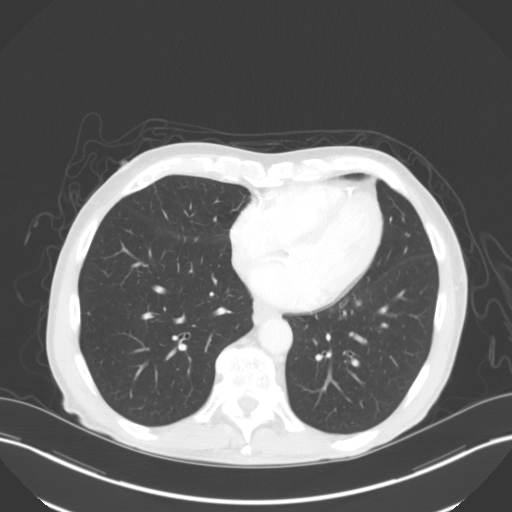

[Series 7: axial neck · axial · 0.66mm/px · z∈[-299,-171]mm · 3 of 128 slices shown]
[im 32/128  mediastinal]
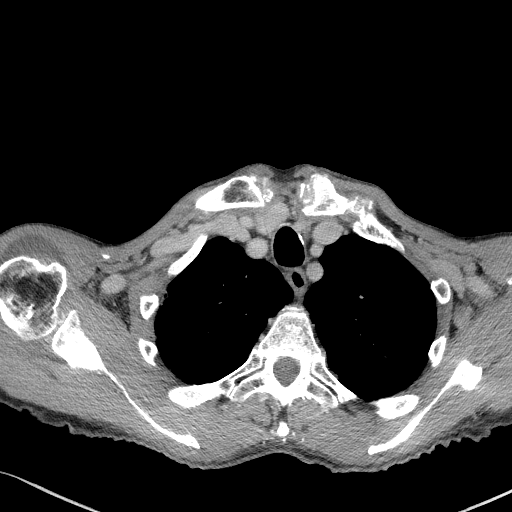
[im 64/128  mediastinal]
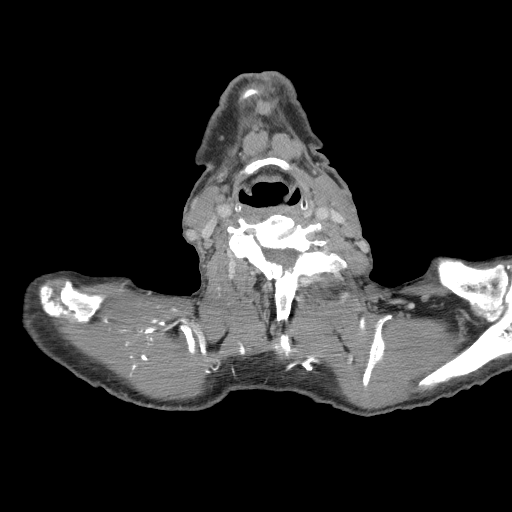
[im 96/128  mediastinal]
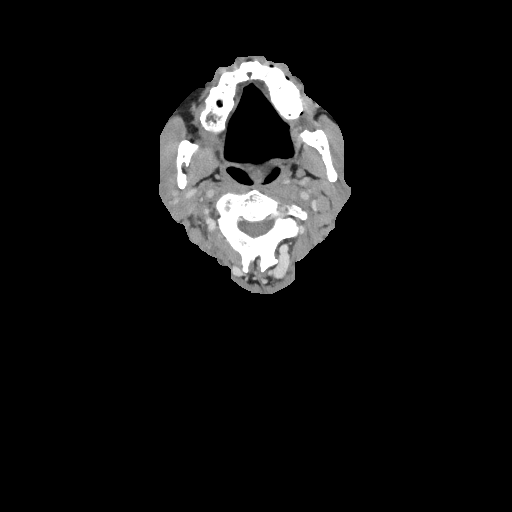

[Series 9: axial bone · axial · 0.66mm/px · z∈[-299,-171]mm · 3 of 128 slices shown]
[im 32/128  bone]
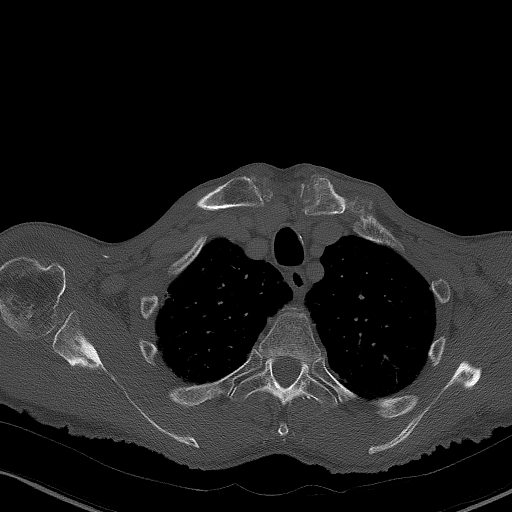
[im 64/128  bone]
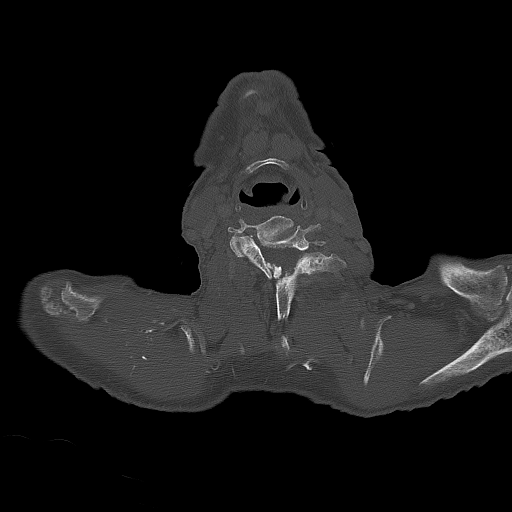
[im 96/128  bone]
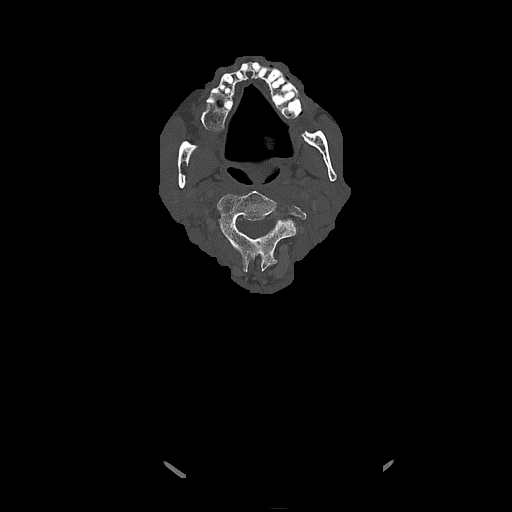

[Series 10: orthogonal (person_name) · axial · 0.60mm/px · z∈[-386,-206]mm · 4 of 158 slices shown]
[im 32/158  mediastinal]
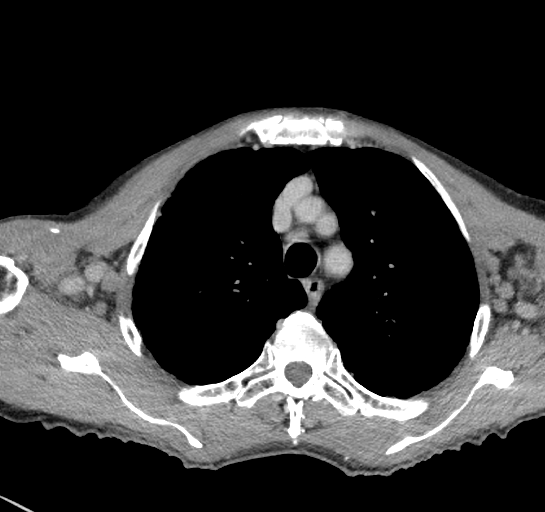
[im 63/158  mediastinal]
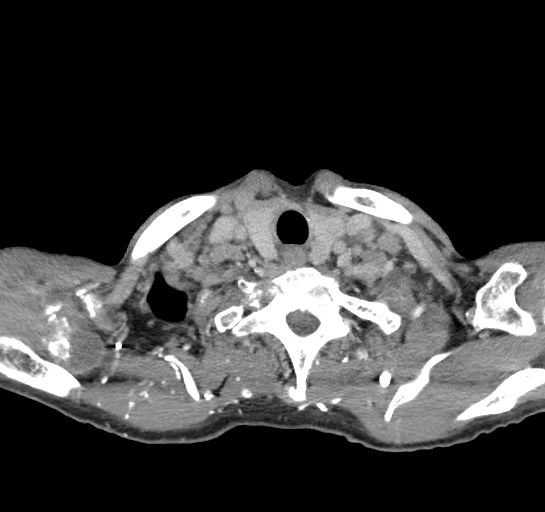
[im 95/158  mediastinal]
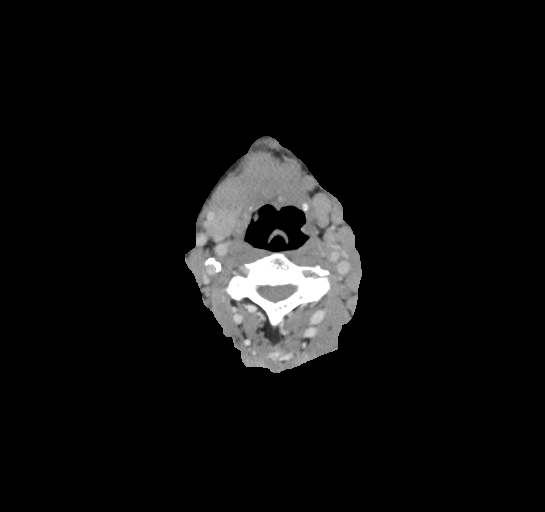
[im 126/158  mediastinal]
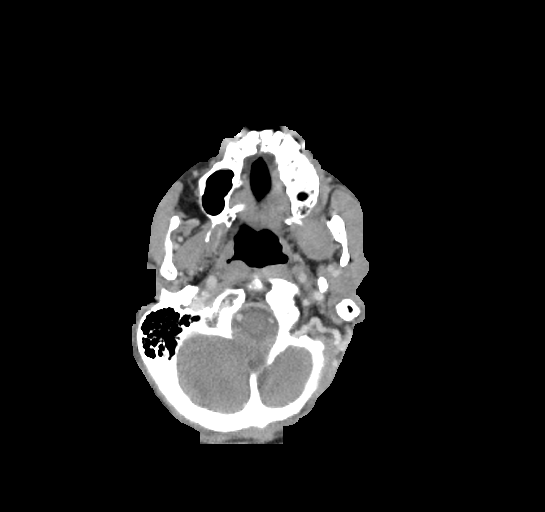

[Series 11: cor neck · coronal · 0.54mm/px · 1 of 141 slices shown, 2 images]
[im 71/141  mediastinal]
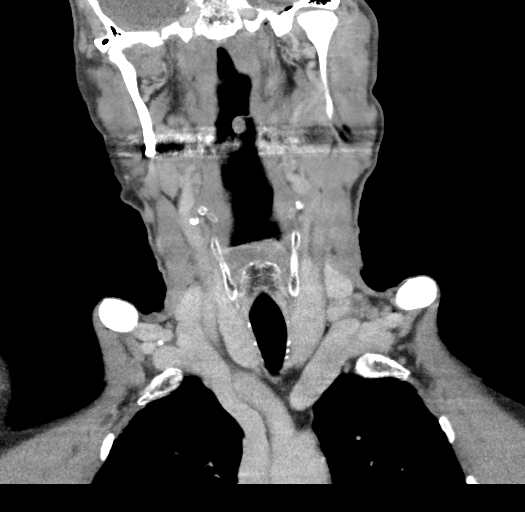
[im 71/141  bone]
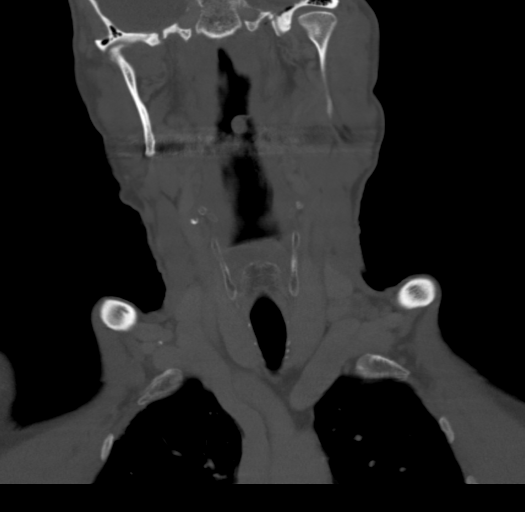

[13 of 36 positions shown; findings below may reference images not displayed]

FINDINGS: CT CHEST FINDINGS

Cardiovascular: Normal size heart. No pericardial effusion.
Three-vessel coronary artery disease. Aortic atherosclerosis.

Mediastinum/Nodes: No mediastinal or hilar adenopathy. Increased
axillary, subpectoral and supraclavicular lymphadenopathy.
Previously indexed lesions are as follows. Dominant left axillary
lymph node now measures 4.7 x 2.4 cm previously 2.4 x 0.8 cm (series
2, image 13). Right axillary lymph node now measures 5.1 x 1.9 cm
previously 2.1 x 0.6 cm (series 2, image 21).

Lungs/Pleura: No suspicious pulmonary nodules or masses. No focal
consolidation. No pleural effusion.

Musculoskeletal: No suspicious lytic or blastic lesion of bone.
Multilevel degenerative changes spine.

CT ABDOMEN PELVIS FINDINGS

Hepatobiliary: No focal liver abnormality is seen. No gallstones,
gallbladder wall thickening, or biliary dilatation.

Pancreas: Diffuse pancreatic calcifications compatible with chronic
pancreatitis. No dilation of the main pancreatic duct. And no
evidence of acute inflammation.

Spleen: Normal in size without focal abnormality.

Adrenals/Urinary Tract: Adrenal nodules or masses. No
hydronephrosis. No suspicious renal masses. Tiny bladder calculi
versus bladder wall calcifications layering along the dependent
portion of the urinary bladder.

Stomach/Bowel: Stomach is within normal limits. Colonic
diverticulosis. No evidence of bowel wall thickening, distention, or
inflammatory changes.

Vascular/Lymphatic: Increased size of the index and non index
abdominopelvic lymph nodes. Index lymph nodes are as follows: Right
inguinal lymph node measures 4.5 x 4.1 cm previously 4.1 x 3.1 cm
(series 2, image 121). Aortocaval lymph node measures 1.9 x 1.0 cm
previously 0.9 x 0.8 cm (series 2, image 75).

Reproductive: Prostatic calcifications.

Other: No abdominopelvic ascites.

Musculoskeletal: Multilevel degenerative change of the spine and
multifocal degenerative joint disease. No suspicious lytic or
blastic lesion of bone.
IMPRESSION: 1. Increased size of the index and non index thoracic and
abdominopelvic lymph nodes, consistent with progression of disease.
2. Stigmata of chronic pancreatitis without findings to suggest
acute inflammation.
3. Unchanged tiny bladder calculi versus bladder wall calcifications
layering along the dependent portion of the urinary bladder.
4. Aortic atherosclerosis.

Aortic Atherosclerosis ([id number]).

## 2023-01-21 IMAGING — CT CT NECK W/ CM
3 of 10 series · 11 of 33 positions shown, 12 images · IV contrast (omnipaque)
Comparison: Neck CT 11/03/2018. Same day CT chest/abdomen/pelvis
04/13/2020.

CLINICAL DATA: Follicular lymphoma grade 1, lymph nodes of multiple
sites. Hematologic malignancy, surveillance; patient with clinical
progression of non-Hodgkin's lymphoma for pretreatment restaging.

EXAM:
CT NECK WITH CONTRAST
TECHNIQUE: Multidetector CT imaging of the neck was performed using the
standard protocol following the bolus administration of intravenous
contrast.
CONTRAST:  100mL OMNIPAQUE IOHEXOL 300 MG/ML  SOLN

[Series 2: cap with · axial · 0.77mm/px · z∈[-871,-236]mm · 3 of 128 slices shown, 4 images]
[im 1/128  soft-tissue]
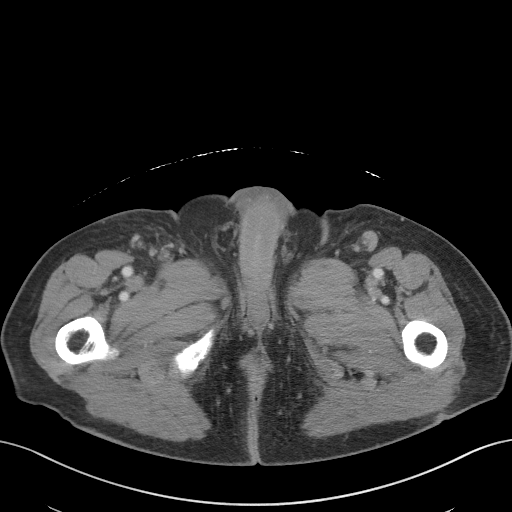
[im 1/128  bone]
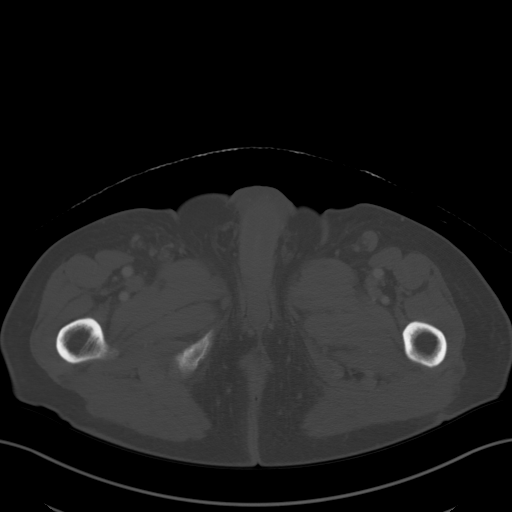
[im 64/128  bone]
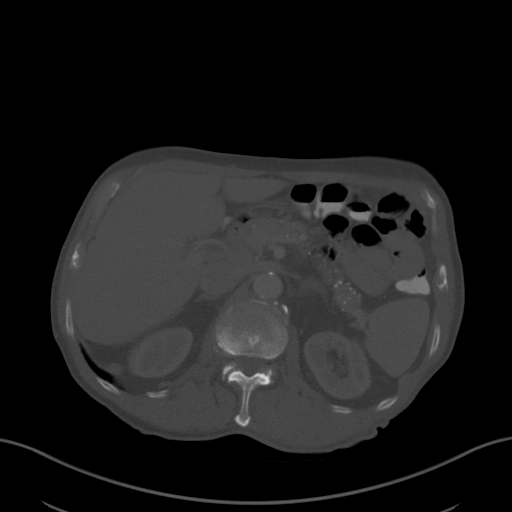
[im 128/128  bone]
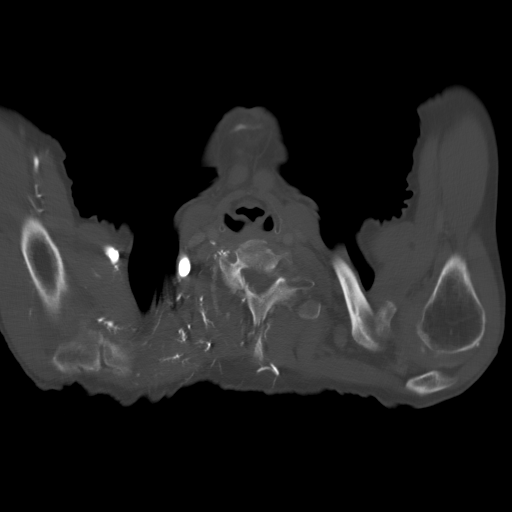

[Series 5: coronals · coronal · 0.75mm/px · 3 of 131 slices shown]
[im 33/131  bone]
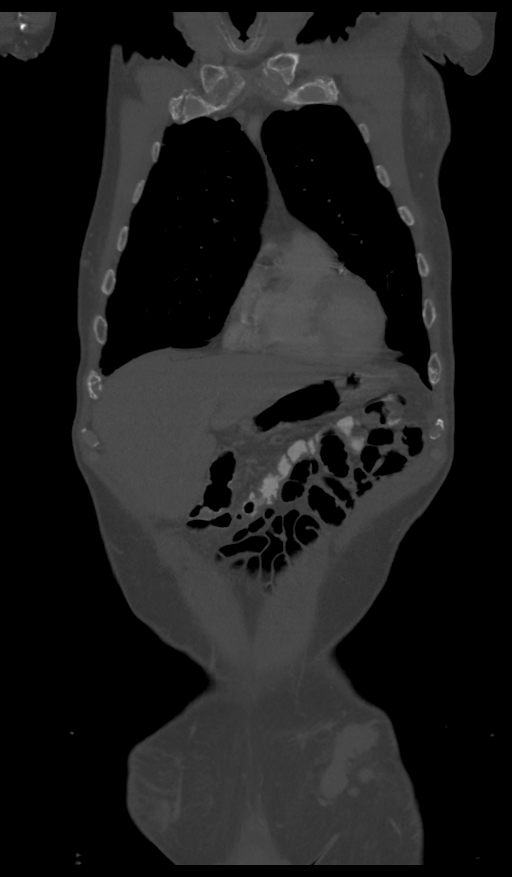
[im 66/131  bone]
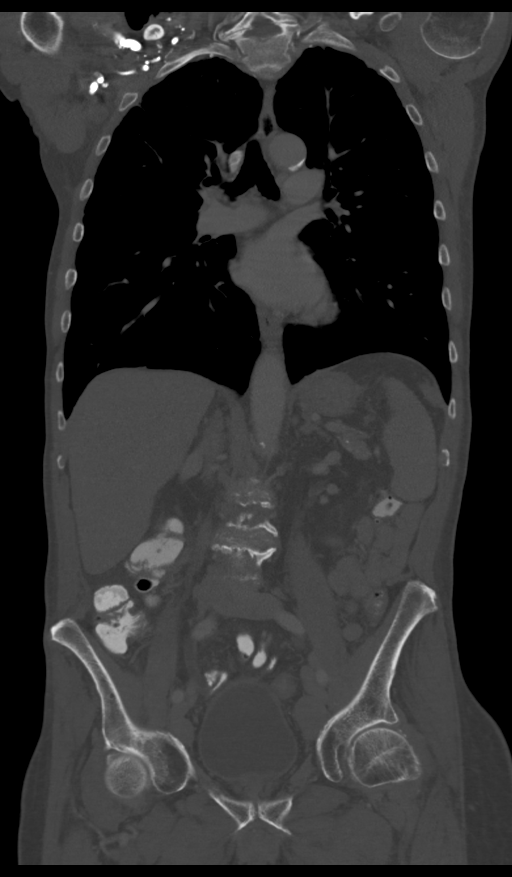
[im 98/131  bone]
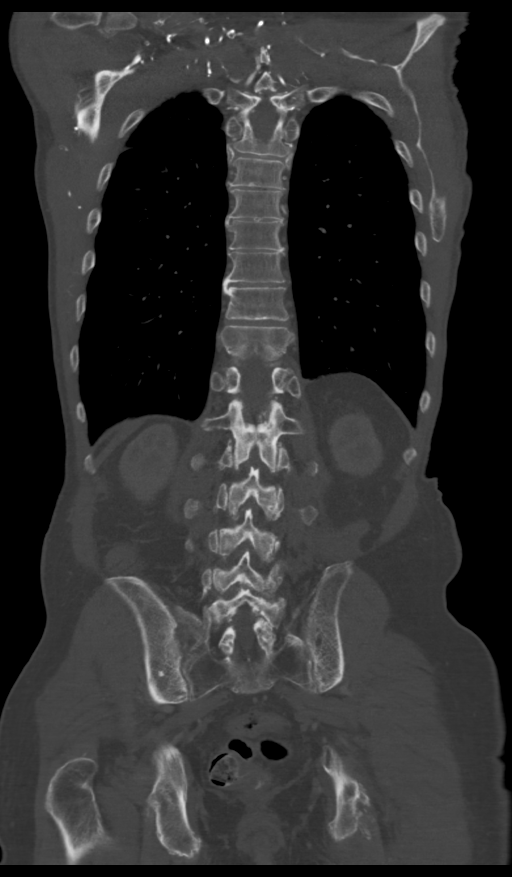

[Series 6: sagittals · sagittal · 0.67mm/px · 5 of 173 slices shown]
[im 44/173  bone]
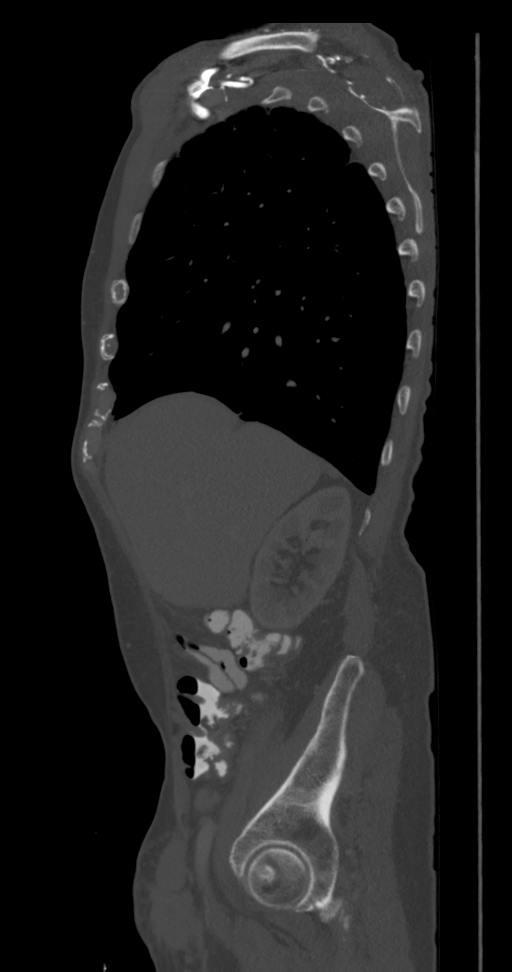
[im 65/173  bone]
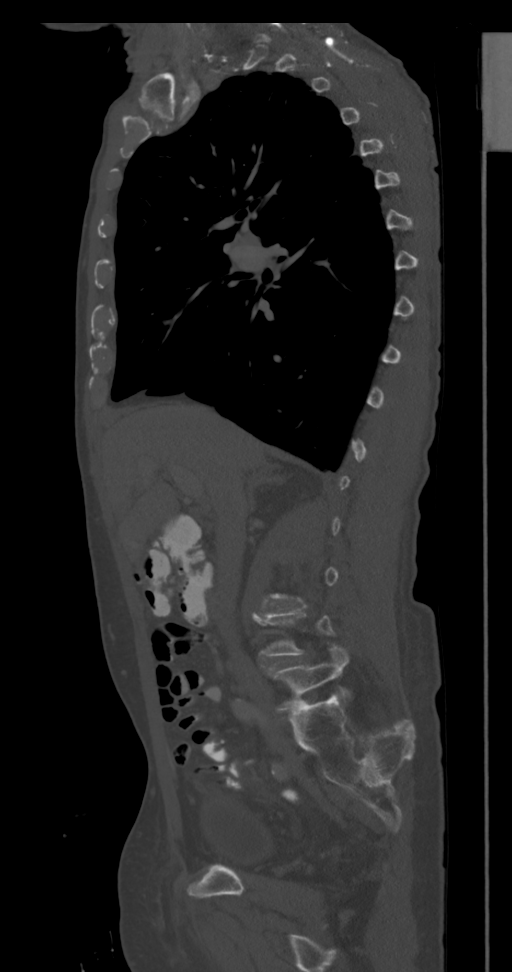
[im 87/173  bone]
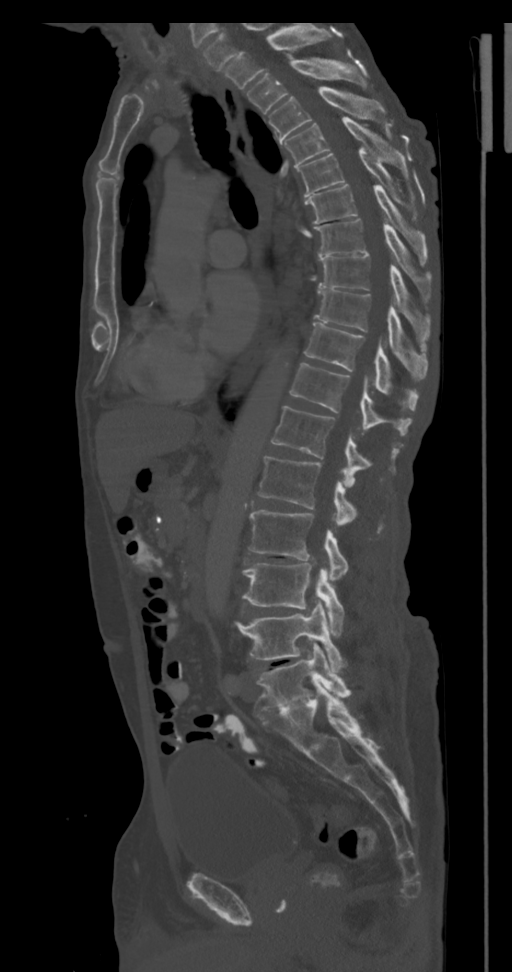
[im 108/173  bone]
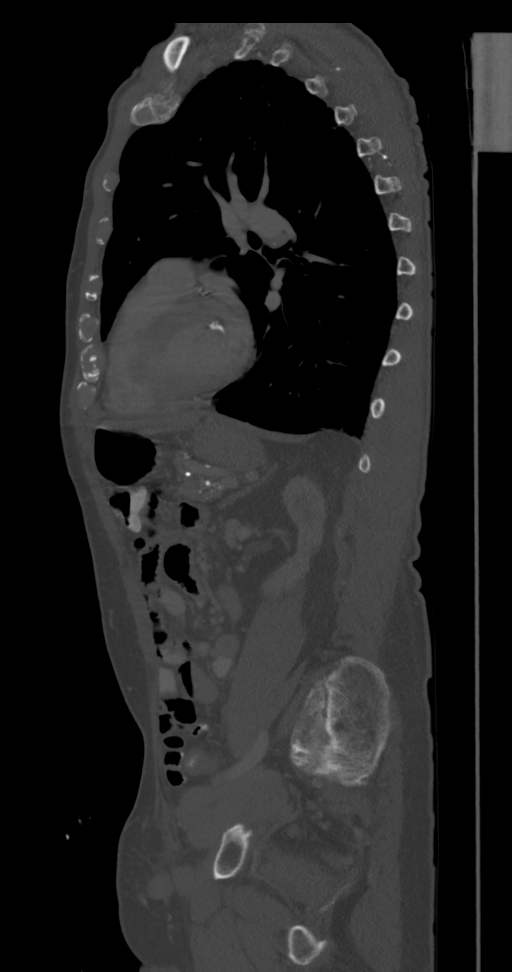
[im 130/173  bone]
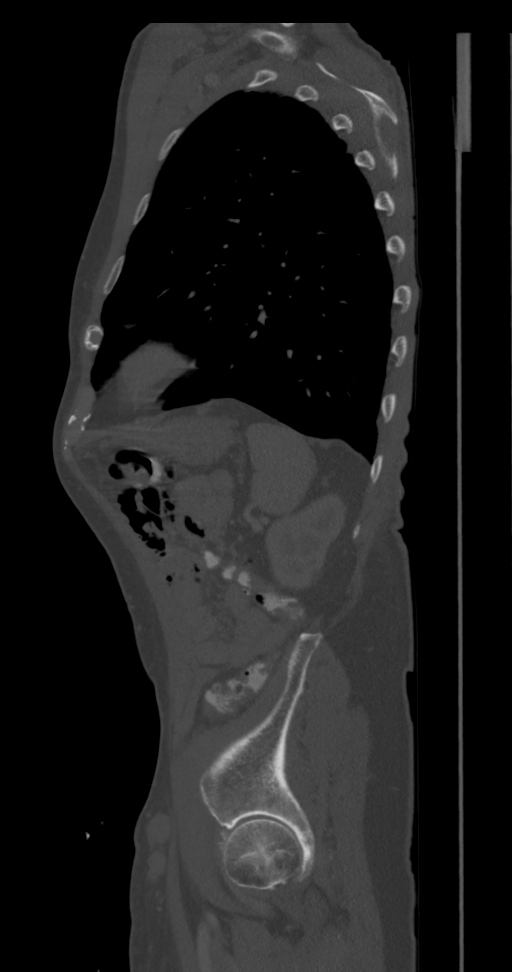

[11 of 33 positions shown; findings below may reference images not displayed]

FINDINGS: Pharynx and larynx: Streak artifact from dental restoration
partially obscures the oral cavity. No appreciable swelling or
discrete mass within the oral cavity, pharynx or larynx.

Salivary glands: No inflammation, mass, or stone. Persistent
intraparotid lymphadenopathy.

Thyroid: Unremarkable.

Lymph nodes: Persistent lymphadenopathy throughout the bilateral
neck. There has been a mixed response to treatment when comparing
with the prior CT neck of 11/03/2018 with some nodes having
decreased in size and some nodes having increased in size in the
interim. For instance, a previous index large node/nodal
conglomerate at the right level 5 station has decreased in size now
measuring 2.8 x 1.6 cm (series 12, image 20) (previously 3.9 x
cm). However, a left level 1 lymph node has increased in size, now
measuring 2.0 x 1.4 cm (previously 1.5 x 0.9 cm). Additionally, a
previous index node within the left lower neck immediately lateral
to the left common carotid artery has decreased in size (now
measuring 1 cm in short axis (previously 1.8 cm).

Vascular: The major vascular structures of the neck are patent.
Atherosclerotic plaque within the visualized aortic arch, the major
branch vessels of the neck and carotid bifurcations/ICAs.

Limited intracranial: No acute intracranial abnormality identified.

Visualized orbits: Incompletely imaged. No mass or acute finding at
the imaged levels.

Mastoids and visualized paranasal sinuses: No significant paranasal
sinus disease or mastoid effusion at the imaged levels.

Skeleton: No acute bony abnormality or aggressive osseous lesion.
Unchanged cervical spondylosis.

Upper chest: Separately reported.
IMPRESSION: Persistent lymphadenopathy throughout the bilateral neck. As
compared to the prior neck CT of 11/03/2018, there has been a mixed
response to treatment with some nodes having increased in size, and
some nodes having decreased in size, in the interim. Please see
nodal measurements provided.

Please refer to same-day CT thorax for description of intrathoracic
findings.

## 2023-01-27 ENCOUNTER — Encounter (HOSPITAL_COMMUNITY): Payer: Self-pay

## 2023-01-28 ENCOUNTER — Inpatient Hospital Stay (HOSPITAL_COMMUNITY)
Admission: AD | Admit: 2023-01-28 | Discharge: 2023-01-30 | DRG: 325 | Disposition: A | Discharge status: Home / Self Care | Payer: Medicare Other | Source: Other Acute Inpatient Hospital | Attending: Internal Medicine | Admitting: Internal Medicine

## 2023-01-28 ENCOUNTER — Encounter (HOSPITAL_COMMUNITY): Payer: Self-pay | Admitting: Internal Medicine

## 2023-01-28 DIAGNOSIS — Z9889 Other specified postprocedural states: Secondary | ICD-10-CM | POA: Diagnosis not present

## 2023-01-28 DIAGNOSIS — I890 Lymphedema, not elsewhere classified: Secondary | ICD-10-CM | POA: Diagnosis present

## 2023-01-28 DIAGNOSIS — Z8249 Family history of ischemic heart disease and other diseases of the circulatory system: Secondary | ICD-10-CM

## 2023-01-28 DIAGNOSIS — I10 Essential (primary) hypertension: Secondary | ICD-10-CM | POA: Diagnosis present

## 2023-01-28 DIAGNOSIS — Z7902 Long term (current) use of antithrombotics/antiplatelets: Secondary | ICD-10-CM

## 2023-01-28 DIAGNOSIS — Z66 Do not resuscitate: Secondary | ICD-10-CM | POA: Diagnosis present

## 2023-01-28 DIAGNOSIS — Z7901 Long term (current) use of anticoagulants: Secondary | ICD-10-CM | POA: Diagnosis not present

## 2023-01-28 DIAGNOSIS — E785 Hyperlipidemia, unspecified: Secondary | ICD-10-CM | POA: Diagnosis present

## 2023-01-28 DIAGNOSIS — T82855A Stenosis of coronary artery stent, initial encounter: Secondary | ICD-10-CM | POA: Diagnosis present

## 2023-01-28 DIAGNOSIS — Y831 Surgical operation with implant of artificial internal device as the cause of abnormal reaction of the patient, or of later complication, without mention of misadventure at the time of the procedure: Secondary | ICD-10-CM | POA: Diagnosis present

## 2023-01-28 DIAGNOSIS — I214 Non-ST elevation (NSTEMI) myocardial infarction: Secondary | ICD-10-CM

## 2023-01-28 DIAGNOSIS — Y712 Prosthetic and other implants, materials and accessory cardiovascular devices associated with adverse incidents: Secondary | ICD-10-CM | POA: Diagnosis present

## 2023-01-28 DIAGNOSIS — I25119 Atherosclerotic heart disease of native coronary artery with unspecified angina pectoris: Secondary | ICD-10-CM | POA: Diagnosis not present

## 2023-01-28 DIAGNOSIS — I110 Hypertensive heart disease with heart failure: Secondary | ICD-10-CM | POA: Diagnosis present

## 2023-01-28 DIAGNOSIS — E782 Mixed hyperlipidemia: Secondary | ICD-10-CM | POA: Diagnosis not present

## 2023-01-28 DIAGNOSIS — C8215 Follicular lymphoma grade II, lymph nodes of inguinal region and lower limb: Secondary | ICD-10-CM | POA: Diagnosis present

## 2023-01-28 DIAGNOSIS — Z79899 Other long term (current) drug therapy: Secondary | ICD-10-CM

## 2023-01-28 DIAGNOSIS — I440 Atrioventricular block, first degree: Secondary | ICD-10-CM | POA: Diagnosis present

## 2023-01-28 DIAGNOSIS — N400 Enlarged prostate without lower urinary tract symptoms: Secondary | ICD-10-CM | POA: Diagnosis present

## 2023-01-28 DIAGNOSIS — R55 Syncope and collapse: Secondary | ICD-10-CM | POA: Diagnosis not present

## 2023-01-28 DIAGNOSIS — Z86718 Personal history of other venous thrombosis and embolism: Secondary | ICD-10-CM

## 2023-01-28 DIAGNOSIS — Z9861 Coronary angioplasty status: Secondary | ICD-10-CM

## 2023-01-28 DIAGNOSIS — I5032 Chronic diastolic (congestive) heart failure: Secondary | ICD-10-CM | POA: Diagnosis present

## 2023-01-28 DIAGNOSIS — I2510 Atherosclerotic heart disease of native coronary artery without angina pectoris: Secondary | ICD-10-CM | POA: Diagnosis present

## 2023-01-28 HISTORY — DX: Non-ST elevation (NSTEMI) myocardial infarction: I21.4

## 2023-01-28 LAB — CBC WITH DIFFERENTIAL/PLATELET
Abs Immature Granulocytes: 0.02 10*3/uL (ref 0.00–0.07)
Basophils Absolute: 0 10*3/uL (ref 0.0–0.1)
Basophils Relative: 0 %
Eosinophils Absolute: 0.3 10*3/uL (ref 0.0–0.5)
Eosinophils Relative: 5 %
HCT: 31.5 % — ABNORMAL LOW (ref 39.0–52.0)
Hemoglobin: 10 g/dL — ABNORMAL LOW (ref 13.0–17.0)
Immature Granulocytes: 0 %
Lymphocytes Relative: 14 %
Lymphs Abs: 0.8 10*3/uL (ref 0.7–4.0)
MCH: 27.3 pg (ref 26.0–34.0)
MCHC: 31.7 g/dL (ref 30.0–36.0)
MCV: 86.1 fL (ref 80.0–100.0)
Monocytes Absolute: 0.8 10*3/uL (ref 0.1–1.0)
Monocytes Relative: 13 %
Neutro Abs: 3.9 10*3/uL (ref 1.7–7.7)
Neutrophils Relative %: 68 %
Platelets: 234 10*3/uL (ref 150–400)
RBC: 3.66 MIL/uL — ABNORMAL LOW (ref 4.22–5.81)
RDW: 13.6 % (ref 11.5–15.5)
WBC: 5.9 10*3/uL (ref 4.0–10.5)
nRBC: 0 % (ref 0.0–0.2)

## 2023-01-28 MED ORDER — BRIMONIDINE TARTRATE 0.2 % OP SOLN
1.0000 [drp] | Freq: Two times a day (BID) | OPHTHALMIC | Status: DC
  Administered 2023-01-29 – 2023-01-30 (×3): 1 [drp] via OPHTHALMIC
  Filled 2023-01-28: qty 5

## 2023-01-28 MED ORDER — ACETAMINOPHEN 325 MG PO TABS: 650.0000 mg | ORAL_TABLET | ORAL | Status: DC | PRN

## 2023-01-28 MED ORDER — ATENOLOL 25 MG PO TABS
25.0000 mg | ORAL_TABLET | Freq: Every day | ORAL | Status: DC
  Administered 2023-01-30: 25 mg via ORAL
  Filled 2023-01-28 (×2): qty 1

## 2023-01-28 MED ORDER — FINASTERIDE 5 MG PO TABS
5.0000 mg | ORAL_TABLET | Freq: Every day | ORAL | Status: DC
  Administered 2023-01-29 – 2023-01-30 (×2): 5 mg via ORAL
  Filled 2023-01-28 (×2): qty 1

## 2023-01-28 MED ORDER — PRAVASTATIN SODIUM 40 MG PO TABS
40.0000 mg | ORAL_TABLET | Freq: Every day | ORAL | Status: DC
  Administered 2023-01-28 – 2023-01-29 (×2): 40 mg via ORAL
  Filled 2023-01-28 (×2): qty 1

## 2023-01-28 MED ORDER — NITROGLYCERIN 0.4 MG SL SUBL: 0.4000 mg | SUBLINGUAL_TABLET | SUBLINGUAL | Status: DC | PRN

## 2023-01-28 MED ORDER — HEPARIN (PORCINE) 25000 UT/250ML-% IV SOLN
1000.0000 [IU]/h | INTRAVENOUS | Status: DC
  Administered 2023-01-29: 850 [IU]/h via INTRAVENOUS
  Filled 2023-01-28: qty 250

## 2023-01-28 MED ORDER — ASPIRIN 81 MG PO TBEC
81.0000 mg | DELAYED_RELEASE_TABLET | Freq: Every day | ORAL | Status: DC
  Administered 2023-01-29 – 2023-01-30 (×2): 81 mg via ORAL
  Filled 2023-01-28 (×2): qty 1

## 2023-01-28 MED ORDER — ONDANSETRON HCL 4 MG/2ML IJ SOLN: 4.0000 mg | Freq: Four times a day (QID) | INTRAMUSCULAR | Status: DC | PRN

## 2023-01-28 NOTE — Progress Notes (Incomplete)
PHARMACY - ANTICOAGULATION CONSULT NOTE  Pharmacy Consult for heparin Indication:  ACS and h/o DVT  Labs: Recent Labs    01/28/23 2326  HGB 10.0*  HCT 31.5*  PLT 234    Medical History: Past Medical History:  Diagnosis Date  . BPH (benign prostatic hypertrophy)   . CAD (coronary artery disease) 01/2010   S/P drug eluting stents LAD and PL branch of RCA Dr. Juanda Chance  . DVT (deep venous thrombosis) (HCC)    Peroneal vein thrombus February 2022  . follicular lymphoma dx'd 06/2017  . Hernia   . HLD (hyperlipidemia)   . HTN (hypertension)     Medications:  Medications Prior to Admission  Medication Sig Dispense Refill Last Dose  . apixaban (ELIQUIS) 5 MG TABS tablet Take 5 mg by mouth 2 (two) times daily.   01/27/2023  . atenolol (TENORMIN) 25 MG tablet Take 1 tablet (25 mg total) by mouth daily. 90 tablet 3 01/28/2023  . finasteride (PROSCAR) 5 MG tablet Take 5 mg by mouth daily.   01/27/2023  . furosemide (LASIX) 20 MG tablet TAKE 1 TABLET BY MOUTH TWICE  DAILY 180 tablet 3 01/27/2023  . potassium chloride SA (KLOR-CON M) 20 MEQ tablet Take 1 tablet (20 mEq total) by mouth daily. Please keep upcoming appointment for future refills. Thank you.   01/27/2023  . pravastatin (PRAVACHOL) 40 MG tablet Take 1 tablet (40 mg total) by mouth at bedtime. 90 tablet 3 01/27/2023  . brimonidine (ALPHAGAN) 0.2 % ophthalmic solution Place 1 drop into the right eye 2 (two) times daily.   Unknown  . cholecalciferol (VITAMIN D) 1000 UNITS tablet Take 1,000 Units by mouth daily.   Unknown  . Cyanocobalamin (B-12) 2500 MCG TABS Take 2,500 mcg by mouth daily.   Unknown  . gabapentin (NEURONTIN) 100 MG capsule Take 1 capsule by mouth 2 (two) times daily as needed (for knee pain).   Unknown  . nitroGLYCERIN (NITROSTAT) 0.4 MG SL tablet Place 1 tablet (0.4 mg total) under the tongue every 5 (five) minutes as needed for chest pain. 25 tablet 6    Scheduled:  . [START ON 01/29/2023] aspirin EC  81 mg Oral  Daily  . [START ON 01/29/2023] atenolol  25 mg Oral Daily  . brimonidine  1 drop Right Eye BID  . [START ON 01/29/2023] finasteride  5 mg Oral Daily  . pravastatin  40 mg Oral QHS    Assessment: 88yo male presented to [facility] c/o syncopal events without chest discomfort, but troponins found to be elevated and rising in pattern c/w NSTEMI; pt was started on heparin and tx'd to [facility] for further cardiac w/u, to continue heparin; pt is on apixaban PTA for h/o DVT.  Heparin was started with bolus of 4200 units followed by infusion at 12 units/kg/hr (842 units/hr); initial PTT was 55 with "heparin correlation" reported as 0.3; per OSH Epic records, heparin bolus/infusion started 11/12 0028 and labs were drawn at 0600, no confirmation labs reported.  Goal of Therapy:  Heparin level 0.3-0.7 units/ml aPTT 66-102 seconds Monitor platelets by anticoagulation protocol: Yes   Plan:  {[id number]}  Colleen Can 01/28/2023,11:56 PM

## 2023-01-28 NOTE — Progress Notes (Signed)
PHARMACY - ANTICOAGULATION CONSULT NOTE  Pharmacy Consult for heparin Indication:  ACS and h/o DVT  Labs: Recent Labs    01/28/23 2326  HGB 10.0*  HCT 31.5*  PLT 234    Medical History: Past Medical History:  Diagnosis Date   BPH (benign prostatic hypertrophy)    CAD (coronary artery disease) 01/2010   S/P drug eluting stents LAD and PL branch of RCA Dr. 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   01/27/2023   atenolol (TENORMIN) 25 MG tablet Take 1 tablet (25 mg total) by mouth daily. 90 tablet 3 01/28/2023   finasteride (PROSCAR) 5 MG tablet Take 5 mg by mouth daily.   01/27/2023   furosemide (LASIX) 20 MG tablet TAKE 1 TABLET BY MOUTH TWICE  DAILY 180 tablet 3 01/27/2023   potassium chloride SA (KLOR-CON M) 20 MEQ tablet Take 1 tablet (20 mEq total) by mouth daily. Please keep upcoming appointment for future refills. Thank you.   01/27/2023   pravastatin (PRAVACHOL) 40 MG tablet Take 1 tablet (40 mg total) by mouth at bedtime. 90 tablet 3 01/27/2023   brimonidine (ALPHAGAN) 0.2 % ophthalmic solution Place 1 drop into the right eye 2 (two) times daily.   Unknown   cholecalciferol (VITAMIN D) 1000 UNITS tablet Take 1,000 Units by mouth daily.   Unknown   Cyanocobalamin (B-12) 2500 MCG TABS Take 2,500 mcg by mouth daily.   Unknown   gabapentin (NEURONTIN) 100 MG capsule Take 1 capsule by mouth 2 (two) times daily as needed (for knee pain).   Unknown   nitroGLYCERIN (NITROSTAT) 0.4 MG SL tablet Place 1 tablet (0.4 mg total) under the tongue every 5 (five) minutes as needed for chest pain. 25 tablet 6    Scheduled:   [START ON 01/29/2023] aspirin EC  81 mg Oral Daily   [START ON  01/29/2023] atenolol  25 mg Oral Daily   brimonidine  1 drop Right Eye BID   [START ON 01/29/2023] finasteride  5 mg Oral Daily   pravastatin  40 mg Oral QHS    Assessment: 88yo male presented to [facility] c/o syncopal events without chest discomfort, but troponins found to be elevated and rising in pattern c/w NSTEMI; pt was started on heparin and tx'd to [facility] for further cardiac w/u, to continue heparin; pt is on apixaban PTA for h/o DVT.  Heparin was started with bolus of 4200 units followed by infusion at 12 units/kg/hr (842 units/hr); initial PTT was 55 with "heparin correlation" reported as 0.3; per OSH Epic records, heparin bolus/infusion started 11/12 0028 and labs were drawn at 0525 (would expect bolus to still affect AC labs at this point w/ pt's age and renal function), no confirmation labs reported.  Pertinent labs from OSH: WBC 9.6, Hgb 10.6, Plt 259, trop-I 0.291>0.949>1.230, SCr 1.1.  Goal of Therapy:  Heparin level 0.3-0.7 units/ml aPTT 66-102 seconds Monitor platelets by anticoagulation protocol: Yes   Plan:  Continue heparin infusion at 850 units/hr for now. Monitor heparin levels, aPTT (while DOAC affects anti-Xa assay), and CBC.  Vernard Gambles, PharmD, BCPS  01/28/2023,11:56 PM

## 2023-01-28 NOTE — H&P (Signed)
Cardiology Admission History and Physical   Patient ID: WACLAW PENE MRN: [id number]; DOB: [birth year]   Admission date: 01/28/2023  PCP:  Barron Alvine, MD   [facility] Providers Cardiologist:  Verne Carrow, MD        Chief Complaint:  syncope  Patient Profile:   Harold [name] is a 88 y.o. 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  History of Present Illness:   Mr. Barefield states that he was in his usual state of health until Monday, 01/27/2023.  On Monday evening, he had just finished loading wood into his furnace and was standing at his refrigerator.  The next thing he remembered was being on the ground.  He denies any symptoms immediately preceding loss of consciousness.  States that he hit his head and elbow.  He called his daughter who helped him off the ground and ultimately took him to the [facility] emergency room.  Denies previous syncope.  States that about 10 days ago, he had an episode of generally feeling unwell which prompted him to take a nitroglycerin with resolution of symptoms.  This happened again around 3-4 days ago with similar response to sublingual nitroglycerin.  He had not required sublingual nitroglycerin in a significant amount of time prior to these events.  Denies any chest pain at rest or on exertion or dyspnea at rest or on exertion recently.  On arrival to [facility] ED, CBC with WBC 9.6, Hgb 10.6 (baseline), PLT 259.  INR 1.21.  CMP essentially unremarkable.  Troponin I 0.291->0.949->1.230->1.220.  CXR report with diffuse moderate right greater than left peribronchial thickening, no pleural effusion or consolidation.  CT head performed with report  indicating no acute intracranial abnormality, with evidence of sequela of senescent small vessel ischemic disease.  He was given aspirin 324 mg, 4 mg Zofran and 500 mL of normal saline.  Also started on IV heparin.    Has a history of PCI to LAD and PL branch of RCA in 01/2010 with additional findings of complete occlusion of the distal LAD, performed in the context of chest pain.  Last seen by Dr. Clifton Moriah on 12/23/2022.  Lisinopril stopped due to recent low blood pressures, continued on atenolol and statin.  Instructed to stop aspirin at that time and continue apixaban.  He was admitted to [facility] in 04/2021 with chest pain and mild troponin evaluation.  Outpatient pharmacologic stress 04/27/2021 with no large areas of ischemia (apical partially reversible defect, likely artifact vs known distal LAD occlusion).  Of note, he has noted to be a nonresponder to Plavix.  Has been receiving radiation treatment for inguinal lymphadenopathy resulting in lower extremity edema and scrotal swelling.  Had an ED visit on 12/13/2022 for cellulitis and was prescribed antibiotics at that time.  Last seen by oncology in 12/2022 with plan for continued palliative radiation therapy to the right inguinal area/pelvis with no additional plans for systemic medical treatment at this time.  Medications include (but not limited to) apixaban 5 mg twice daily, atenolol 25 mg daily, Lasix 20 mg twice daily pravastatin 40 mg daily.  Denies previous history of tobacco use.  Very seldom alcohol use.  Lives alone with family nearby.  Past Medical History:  Diagnosis Date   BPH (  benign prostatic hypertrophy)    CAD (coronary artery disease) 01/2010   S/P drug eluting stents LAD and PL branch of RCA Dr. 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   follicular lymphoma dx'd 06/2017   Hernia    HLD (hyperlipidemia)    HTN (hypertension)     Past Surgical History:  Procedure  Laterality Date   BACK SURGERY     CYSTOSCOPY WITH LITHOLAPAXY N/A 05/20/2019   Procedure: CYSTOSCOPY WITH LITHOLAPAXY;  Surgeon: Crist Fat, MD;  Location: [facility];  Service: Urology;  Laterality: N/A;   CYSTOSCOPY/URETEROSCOPY/HOLMIUM LASER/STENT PLACEMENT Left 05/20/2019   Procedure: CYSTOSCOPY/URETEROSCOPY/HOLMIUM LASER/STENT PLACEMENT;  Surgeon: Crist Fat, MD;  Location: [facility];  Service: Urology;  Laterality: Left;   HERNIA REPAIR  01/08/11   RIH   MASS EXCISION Right 08/05/2017   Procedure: EXCISION RIGHT POSTERIOR  NECK LYMPH NODE;  Surgeon: Drema Halon, MD;  Location: [facility];  Service: ENT;  Laterality: Right;     Medications Prior to Admission: Prior to Admission medications   Medication Sig Start Date End Date Taking? Authorizing Provider  apixaban (ELIQUIS) 5 MG TABS tablet Take 5 mg by mouth 2 (two) times daily. 12/20/22   [provider]  atenolol (TENORMIN) 25 MG tablet Take 1 tablet (25 mg total) by mouth daily. 12/23/22   Kathleene Hazel, MD  brimonidine (ALPHAGAN) 0.2 % ophthalmic solution Place 1 drop into the right eye 2 (two) times daily.    [provider]  cholecalciferol (VITAMIN D) 1000 UNITS tablet Take 1,000 Units by mouth daily.    [provider]  Cyanocobalamin (B-12) 2500 MCG TABS Take 2,500 mcg by mouth daily.    [provider]  finasteride (PROSCAR) 5 MG tablet Take 5 mg by mouth daily.    [provider]  furosemide (LASIX) 20 MG tablet TAKE 1 TABLET BY MOUTH TWICE  DAILY 02/19/22   Dyann Kief, PA-C  gabapentin (NEURONTIN) 100 MG capsule Take 1 capsule by mouth 2 (two) times daily as needed (for knee pain). 10/24/21   [provider]  nitroGLYCERIN (NITROSTAT) 0.4 MG SL tablet Place 1 tablet (0.4 mg total) under the tongue every 5 (five) minutes as needed for chest pain. 04/02/17 11/17/22  Kathleene Hazel, MD  potassium chloride SA (KLOR-CON M) 20  MEQ tablet Take 1 tablet (20 mEq total) by mouth daily. Please keep upcoming appointment for future refills. Thank you. 11/23/22   Marinda Elk, MD  pravastatin (PRAVACHOL) 40 MG tablet Take 1 tablet (40 mg total) by mouth at bedtime. 12/26/21   Kathleene Hazel, MD     Allergies:   No Known Allergies  Social History:   Social History   Socioeconomic History   Marital status: Married    Spouse name: Not on file   Number of children: Not on file   Years of education: Not on file   Highest education level: Not on file  Occupational History   Not on file  Tobacco Use   Smoking status: Never   Smokeless tobacco: Never  Vaping Use   Vaping status: Never Used  Substance and Sexual Activity   Alcohol use: No   Drug use: No   Sexual activity: Not on file  Other Topics Concern   Not on file  Social History Narrative   Married   Social Determinants of Health   Financial Resource Strain: Low Risk  (  11/17/2022)   Received from [facility]   Overall Financial Resource Strain (CARDIA)    Difficulty of Paying Living Expenses: Not very hard  Food Insecurity: No Food Insecurity (01/28/2023)   Hunger Vital Sign    Worried About Running Out of Food in the Last Year: Never true    Ran Out of Food in the Last Year: Never true  Transportation Needs: No Transportation Needs (01/28/2023)   PRAPARE - Administrator, Civil Service (Medical): No    Lack of Transportation (Non-Medical): No  Physical Activity: Inactive (11/17/2022)   Received from [facility]   Exercise Vital Sign    Days of Exercise per Week: 0 days    Minutes of Exercise per Session: 0 min  Stress: Stress Concern Present (11/17/2022)   Received from [facility] Occupational [facility] - Occupational Stress Questionnaire    Feeling of Stress : Rather much  Social Connections: Moderately Isolated (11/17/2022)   Received from [facility]   Social Connection and Isolation  Panel [NHANES]    Frequency of Communication with Friends and Family: More than three times a week    Frequency of Social Gatherings with Friends and Family: More than three times a week    Attends Religious Services: More than 4 times per year    Active Member of [employer] or Organizations: No    Attends Banker Meetings: Never    Marital Status: Widowed  Intimate Partner Violence: Not At Risk (01/28/2023)   Humiliation, Afraid, Rape, and Kick questionnaire    Fear of Current or Ex-Partner: No    Emotionally Abused: No    Physically Abused: No    Sexually Abused: No    Family History:   The patient's family history includes Heart failure in his mother.    ROS:  Please see the history of present illness.  All other ROS reviewed and negative.     Physical Exam/Data:   Vitals:   01/28/23 2200 01/28/23 2218  Weight: 69.9 kg 68.8 kg  Height: 5\' 10"  (1.778 m)    No intake or output data in the 24 hours ending 01/28/23 2314    01/28/2023   10:18 PM 01/28/2023   10:00 PM 01/06/2023    2:07 PM  Last 3 Weights  Weight (lbs) 151 lb 11.2 oz 154 lb 1.6 oz 159 lb 6 oz  Weight (kg) 68.811 kg 69.9 kg 72.292 kg     Body mass index is 21.77 kg/m.  General: Thin appearing and in no acute distress HEENT: normal Neck: no JVD Vascular:  Distal pulses 2+ radial Cardiac:  normal S1, S2; RRR; no murmur  Lungs:  clear to auscultation bilaterally, no wheezing, rhonchi or rales  Abd: soft, nontender, no hepatomegaly  Ext: no edema Musculoskeletal:  No deformities, BUE and BLE strength normal and equal Skin: warm and dry  Neuro:  CNs 2-12 intact, no focal abnormalities noted Psych:  Normal affect    EKG:  The ECG that was done 01/28/2023 was personally reviewed and demonstrates normal sinus rhythm with first-degree AV block and no significant ST or T wave changes  Relevant CV Studies:  Echo 11/19/22  1. Left ventricular ejection fraction, by estimation, is 60 to 65%. The  left  ventricle has normal function. The left ventricle has no regional  wall motion abnormalities. Left ventricular diastolic parameters are  consistent with Grade II diastolic  dysfunction (pseudonormalization). Elevated left atrial pressure.  2. Right ventricular systolic function is normal. The right ventricular  size is normal. There is normal pulmonary artery systolic pressure.   3. The mitral valve is degenerative. Mild mitral valve regurgitation.  Mild mitral stenosis. The mean mitral valve gradient is 5.3 mmHg with  average heart rate of 77 bpm. Moderate to severe mitral annular  calcification.   4. The aortic valve is tricuspid. There is moderate calcification of the  aortic valve. There is moderate thickening of the aortic valve. Aortic  valve regurgitation is not visualized. Aortic valve  sclerosis/calcification is present, without any evidence  of aortic stenosis.   Comparison(s): The left ventricular diastolic function is significantly  worse.   Conclusion(s)/Recommendation(s): No evidence of valvular vegetations on  this transthoracic echocardiogram. Consider a transesophageal  echocardiogram to exclude infective endocarditis if clinically indicated.   12/20/22 [place] bilateral LE FINDINGS:  There is extensive subcutaneous edema throughout both legs. Prominent right inguinal lymph nodes. There is flow preserved in both legs with areas of chronic and acute on chronic thrombus present.  RIGHT: Small amount of nonocclusive thrombus at the proximal common femoral vein and the saphenous femoral vein junction. The femoral vein demonstrates partially nonocclusive thrombus. The popliteal vein is patent. Limited evaluation of the peroneal veins due to extensive edema.  LEFT: Noncompressibility of the left common femoral vein with preserved flow. The left proximal and distal femoral vein are partially compressible. The left posterior tibial vein noncompressible. The peroneal veins are  nonvisualized   Laboratory Data:  High Sensitivity Troponin:  No results for input(s): "TROPONINIHS" in the last 720 hours.    ChemistryNo results for input(s): "NA", "K", "CL", "CO2", "GLUCOSE", "BUN", "CREATININE", "CALCIUM", "MG", "GFRNONAA", "GFRAA", "ANIONGAP" in the last 168 hours.  No results for input(s): "PROT", "ALBUMIN", "AST", "ALT", "ALKPHOS", "BILITOT" in the last 168 hours. Lipids No results for input(s): "CHOL", "TRIG", "HDL", "LABVLDL", "LDLCALC", "CHOLHDL" in the last 168 hours. HematologyNo results for input(s): "WBC", "RBC", "HGB", "HCT", "MCV", "MCH", "MCHC", "RDW", "PLT" in the last 168 hours. Thyroid No results for input(s): "TSH", "FREET4" in the last 168 hours. BNPNo results for input(s): "BNP", "PROBNP" in the last 168 hours.  DDimer No results for input(s): "DDIMER" in the last 168 hours.   Radiology/Studies:  No results found.   Assessment and Plan:   NSTEMI, type I Syncope, possibly cardiogenic Presenting following a syncopal event with no preceding symptoms.  Had 2 episodes a few days prior to this event of feeling generally unwell (though no chest discomfort) with resolution of symptoms with sublingual nitroglycerin.  Additionally, following the event, has not had chest discomfort.  Interestingly, had positive troponin that was uptrending and appears to have peaked and is in a pattern consistent with NSTEMI (0.291->0.949->1.230->1.220).  No other obvious type II stressors.  PE is possible (especially given malignancy hx and DVTs) in light of syncopal event although felt to be less likely (especially proximal PE) given no chest discomfort, tachycardia, hypotension, hypoxemia.  He has essentially very minimal evidence of conduction disease with a first-degree AV block and QRS is narrow.  He has no evidence of infection or focal neurologic deficits.  CT head without evidence of bleed.  Does not appear to be in decompensated heart failure.  Will treat as type I NSTEMI  given symptoms and positive troponin with delta.  Of note, documented to be a Plavix non-responder. - BNP - Echo - Remain on telemetry to evaluate for arrhythmias/conduction disease - N.p.o. at midnight for cardiac catheterization consideration  in the morning - IV heparin for ACS - Keep K> 4 and mag >2 - Continue home atenolol and statin (last LDL 39 in 12/2021)  Chronic diastolic heart failure Hypertension No evidence of volume overload.  NYHA class II symptoms.  Takes Lasix 20 mg twice daily at baseline with potassium supplementation.  Given euvolemic status, will hold for now.  Grade 2 follicular lymphoma Currently on medical therapy.  Follows with oncology.  Undergoing palliative radiation to the right inguinal/pelvic area with improvement of lymphedema.  Lower extremity DVT on anticoagulation Secondary to pelvic lymphadenopathy.  On apixaban 5 mg twice daily without any history of bleeding.  Will hold apixaban while on IV heparin.   Risk Assessment/Risk Scores:    TIMI Risk Score for Unstable Angina or Non-ST Elevation MI:   The patient's TIMI risk score is 4, which indicates a 20% risk of all cause mortality, new or recurrent myocardial infarction or need for urgent revascularization in the next 14 days.  New York Heart Association (NYHA) Functional Class NYHA Class II    Code Status: Full Code  We had a discussion about CODE STATUS which was observed by bedside nurse.  We were alerted  by transport team that he was a DNAR.  I discussed with him the possible events of cardiac or respiratory arrest and explained CPR, defibrillation and intubation.  He stated that he would want attempts of each of these if there was a chance to rapidly reverse an acute process.  However, he made it very clear that he would not want to be maintained on life support, should that arise.  Therefore, made FULL CODE.  Severity of Illness: The appropriate patient status for this patient is INPATIENT.  Inpatient status is judged to be reasonable and necessary in order to provide the required intensity of service to ensure the patient's safety. The patient's presenting symptoms, physical exam findings, and initial radiographic and laboratory data in the context of their chronic comorbidities is felt to place them at high risk for further clinical deterioration. Furthermore, it is not anticipated that the patient will be medically stable for discharge from the hospital within 2 midnights of admission.   * I certify that at the point of admission it is my clinical judgment that the patient will require inpatient hospital care spanning beyond 2 midnights from the point of admission due to high intensity of service, high risk for further deterioration and high frequency of surveillance required.*   For questions or updates, please contact [facility] Please consult [email or web address] for contact info under     Signed, Aundra Dubin, MD  01/28/2023 11:14 PM

## 2023-01-29 ENCOUNTER — Encounter (HOSPITAL_COMMUNITY)
Admission: AD | Disposition: A | Discharge status: Home / Self Care | Payer: Self-pay | Source: Other Acute Inpatient Hospital | Attending: Internal Medicine

## 2023-01-29 ENCOUNTER — Inpatient Hospital Stay (HOSPITAL_COMMUNITY): Payer: Medicare Other

## 2023-01-29 ENCOUNTER — Other Ambulatory Visit: Payer: Self-pay

## 2023-01-29 DIAGNOSIS — R55 Syncope and collapse: Secondary | ICD-10-CM

## 2023-01-29 DIAGNOSIS — I214 Non-ST elevation (NSTEMI) myocardial infarction: Secondary | ICD-10-CM | POA: Diagnosis not present

## 2023-01-29 DIAGNOSIS — I25119 Atherosclerotic heart disease of native coronary artery with unspecified angina pectoris: Secondary | ICD-10-CM

## 2023-01-29 DIAGNOSIS — I2510 Atherosclerotic heart disease of native coronary artery without angina pectoris: Secondary | ICD-10-CM | POA: Diagnosis not present

## 2023-01-29 HISTORY — PX: CORONARY LITHOTRIPSY: CATH118330

## 2023-01-29 HISTORY — PX: CORONARY BALLOON ANGIOPLASTY: CATH118233

## 2023-01-29 HISTORY — PX: CORONARY ANGIOGRAPHY: CATH118303

## 2023-01-29 LAB — ECHOCARDIOGRAM COMPLETE
AR max vel: 1.71 cm2
AV Area VTI: 1.69 cm2
AV Area mean vel: 1.71 cm2
AV Mean grad: 9 mm[Hg]
AV Peak grad: 16.8 mm[Hg]
Ao pk vel: 2.05 m/s
Area-P 1/2: 2.73 cm2
Height: 70 in
MV VTI: 1.64 cm2
S' Lateral: 2.9 cm
Weight: 2427.2 [oz_av]

## 2023-01-29 LAB — HEPARIN LEVEL (UNFRACTIONATED)
Heparin Unfractionated: 0.28 [IU]/mL — ABNORMAL LOW (ref 0.30–0.70)
Heparin Unfractionated: 0.79 [IU]/mL — ABNORMAL HIGH (ref 0.30–0.70)

## 2023-01-29 LAB — POCT ACTIVATED CLOTTING TIME
Activated Clotting Time: 204 s
Activated Clotting Time: 216 s
Activated Clotting Time: 216 s
Activated Clotting Time: 256 s
Activated Clotting Time: 256 s

## 2023-01-29 LAB — BRAIN NATRIURETIC PEPTIDE: B Natriuretic Peptide: 482.6 pg/mL — ABNORMAL HIGH (ref 0.0–100.0)

## 2023-01-29 LAB — MAGNESIUM: Magnesium: 1.8 mg/dL (ref 1.7–2.4)

## 2023-01-29 LAB — BASIC METABOLIC PANEL
Anion gap: 5 (ref 5–15)
BUN: 20 mg/dL (ref 8–23)
CO2: 29 mmol/L (ref 22–32)
Calcium: 8.6 mg/dL — ABNORMAL LOW (ref 8.9–10.3)
Chloride: 109 mmol/L (ref 98–111)
Creatinine, Ser: 1.04 mg/dL (ref 0.61–1.24)
GFR, Estimated: >60 mL/min (ref >60)
Glucose, Bld: 106 mg/dL — ABNORMAL HIGH (ref 70–99)
Potassium: 3.3 mmol/L — ABNORMAL LOW (ref 3.5–5.1)
Sodium: 143 mmol/L (ref 135–145)

## 2023-01-29 LAB — MRSA NEXT GEN BY PCR, NASAL: MRSA by PCR Next Gen: NOT DETECTED

## 2023-01-29 LAB — APTT
aPTT: 35 s (ref 24–36)
aPTT: 160 s — ABNORMAL HIGH (ref 24–36)

## 2023-01-29 SURGERY — CORONARY LITHOTRIPSY
Anesthesia: LOCAL

## 2023-01-29 MED ORDER — IOHEXOL 350 MG/ML SOLN
INTRAVENOUS | Status: DC | PRN
  Administered 2023-01-29: 110 mL via INTRA_ARTERIAL

## 2023-01-29 MED ORDER — VERAPAMIL HCL 2.5 MG/ML IV SOLN
INTRAVENOUS | Status: AC
  Filled 2023-01-29: qty 2

## 2023-01-29 MED ORDER — HEPARIN SODIUM (PORCINE) 1000 UNIT/ML IJ SOLN
INTRAMUSCULAR | Status: DC | PRN
  Administered 2023-01-29: 3500 [IU] via INTRAVENOUS
  Administered 2023-01-29: 4000 [IU] via INTRAVENOUS
  Administered 2023-01-29: 5000 [IU] via INTRAVENOUS
  Administered 2023-01-29: 2000 [IU] via INTRAVENOUS

## 2023-01-29 MED ORDER — SODIUM CHLORIDE 0.9 % WEIGHT BASED INFUSION: 3.0000 mL/kg/h | INTRAVENOUS | Status: DC

## 2023-01-29 MED ORDER — SODIUM CHLORIDE 0.9 % IV SOLN: 250.0000 mL | INTRAVENOUS | Status: DC | PRN

## 2023-01-29 MED ORDER — HEPARIN BOLUS VIA INFUSION
2000.0000 [IU] | Freq: Once | INTRAVENOUS | Status: AC
  Administered 2023-01-29: 2000 [IU] via INTRAVENOUS
  Filled 2023-01-29: qty 2000

## 2023-01-29 MED ORDER — HEPARIN (PORCINE) 25000 UT/250ML-% IV SOLN
1150.0000 [IU]/h | INTRAVENOUS | Status: DC
  Administered 2023-01-30 (×2): 1000 [IU]/h via INTRAVENOUS
  Filled 2023-01-29: qty 250

## 2023-01-29 MED ORDER — HEPARIN SODIUM (PORCINE) 1000 UNIT/ML IJ SOLN
INTRAMUSCULAR | Status: AC
Start: 2023-01-29 — End: ?
  Filled 2023-01-29: qty 10

## 2023-01-29 MED ORDER — CLOPIDOGREL BISULFATE 75 MG PO TABS
75.0000 mg | ORAL_TABLET | Freq: Every day | ORAL | Status: DC
  Administered 2023-01-30: 75 mg via ORAL
  Filled 2023-01-29: qty 1

## 2023-01-29 MED ORDER — ASPIRIN 81 MG PO CHEW: 81.0000 mg | CHEWABLE_TABLET | ORAL | Status: DC

## 2023-01-29 MED ORDER — HYDRALAZINE HCL 20 MG/ML IJ SOLN: 10.0000 mg | INTRAMUSCULAR | Status: AC | PRN

## 2023-01-29 MED ORDER — FENTANYL CITRATE (PF) 100 MCG/2ML IJ SOLN
INTRAMUSCULAR | Status: AC
  Filled 2023-01-29: qty 2

## 2023-01-29 MED ORDER — HEPARIN SODIUM (PORCINE) 1000 UNIT/ML IJ SOLN
INTRAMUSCULAR | Status: AC
  Filled 2023-01-29: qty 10

## 2023-01-29 MED ORDER — LIDOCAINE HCL (PF) 1 % IJ SOLN
INTRAMUSCULAR | Status: AC
Start: 2023-01-29 — End: ?
  Filled 2023-01-29: qty 30

## 2023-01-29 MED ORDER — SODIUM CHLORIDE 0.9 % IV SOLN: INTRAVENOUS | Status: AC

## 2023-01-29 MED ORDER — POTASSIUM CHLORIDE CRYS ER 20 MEQ PO TBCR
40.0000 meq | EXTENDED_RELEASE_TABLET | Freq: Once | ORAL | Status: AC
  Administered 2023-01-29: 40 meq via ORAL
  Filled 2023-01-29: qty 2

## 2023-01-29 MED ORDER — HEPARIN (PORCINE) IN NACL 1000-0.9 UT/500ML-% IV SOLN
INTRAVENOUS | Status: DC | PRN
  Administered 2023-01-29 (×2): 500 mL

## 2023-01-29 MED ORDER — SODIUM CHLORIDE 0.9% FLUSH: 3.0000 mL | INTRAVENOUS | Status: DC | PRN

## 2023-01-29 MED ORDER — MIDAZOLAM HCL 2 MG/2ML IJ SOLN
INTRAMUSCULAR | Status: DC | PRN
  Administered 2023-01-29: 1 mg via INTRAVENOUS

## 2023-01-29 MED ORDER — MIDAZOLAM HCL 2 MG/2ML IJ SOLN
INTRAMUSCULAR | Status: AC
  Filled 2023-01-29: qty 2

## 2023-01-29 MED ORDER — LIDOCAINE HCL (PF) 1 % IJ SOLN
INTRAMUSCULAR | Status: DC | PRN
  Administered 2023-01-29: 2 mL

## 2023-01-29 MED ORDER — LABETALOL HCL 5 MG/ML IV SOLN: 10.0000 mg | INTRAVENOUS | Status: AC | PRN

## 2023-01-29 MED ORDER — FENTANYL CITRATE (PF) 100 MCG/2ML IJ SOLN
INTRAMUSCULAR | Status: DC | PRN
  Administered 2023-01-29 (×2): 25 ug via INTRAVENOUS

## 2023-01-29 MED ORDER — SODIUM CHLORIDE 0.9 % WEIGHT BASED INFUSION: 1.0000 mL/kg/h | INTRAVENOUS | Status: DC

## 2023-01-29 MED ORDER — CLOPIDOGREL BISULFATE 300 MG PO TABS
ORAL_TABLET | ORAL | Status: DC | PRN
  Administered 2023-01-29: 600 mg via ORAL

## 2023-01-29 MED ORDER — SODIUM CHLORIDE 0.9% FLUSH
3.0000 mL | Freq: Two times a day (BID) | INTRAVENOUS | Status: DC
  Administered 2023-01-29 – 2023-01-30 (×2): 3 mL via INTRAVENOUS

## 2023-01-29 MED ORDER — VERAPAMIL HCL 2.5 MG/ML IV SOLN
INTRAVENOUS | Status: DC | PRN
  Administered 2023-01-29: 10 mL via INTRA_ARTERIAL

## 2023-01-29 MED ORDER — CLOPIDOGREL BISULFATE 300 MG PO TABS
ORAL_TABLET | ORAL | Status: AC
  Filled 2023-01-29: qty 2

## 2023-01-29 SURGICAL SUPPLY — 16 items
BALL SAPPHIRE NC24 3.0X12 (BALLOONS) ×1
BALLN EMERGE MR 2.5X12 (BALLOONS) ×1
BALLOON EMERGE MR 2.5X12 (BALLOONS) IMPLANT
BALLOON SAPPHIRE NC24 3.0X12 (BALLOONS) IMPLANT
CATH 5FR JL3.5 JR4 ANG PIG MP (CATHETERS) IMPLANT
CATH SHOCKWAVE C2 3.0X12 (CATHETERS) IMPLANT
CATH VISTA GUIDE 6FR XBLAD3.5 (CATHETERS) IMPLANT
DEVICE RAD COMP TR BAND LRG (VASCULAR PRODUCTS) IMPLANT
ELECT DEFIB PAD ADLT CADENCE (PAD) IMPLANT
GLIDESHEATH SLEND SS 6F .021 (SHEATH) IMPLANT
GUIDEWIRE INQWIRE 1.5J.035X260 (WIRE) IMPLANT
INQWIRE 1.5J .035X260CM (WIRE) ×1
KIT ENCORE 26 ADVANTAGE (KITS) IMPLANT
PACK CARDIAC CATHETERIZATION (CUSTOM PROCEDURE TRAY) ×1 IMPLANT
SET ATX-X65L (MISCELLANEOUS) IMPLANT
WIRE ASAHI PROWATER 180CM (WIRE) IMPLANT

## 2023-01-29 NOTE — Progress Notes (Signed)
Patient Name: Harold [name] Date of Encounter: 01/29/2023 [facility] Cardiologist: Verne Carrow, MD   Assessment & Plan .     Harold [name] is a 88 y.o. 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 01/28/2023 (as transfer from [facility]) for the evaluation of syncope and elevated troponin concerning for non-STEMI. Denied chest pain, but did have a sense of general unease that was relieved with nitroglycerin.  Syncope: Cardiogenic until proven otherwise.  Need to exclude arrhythmia and potentially ischemic arrhythmia especially in setting of elevated troponin Non-STEMI-had symptoms of but did not seem anginal in nature, but a sense of general unwellness relieved with nitroglycerin now with elevated troponin.  I agree with the admitting physician suggestion that this is potentially a type I MI and that we need to exclude ischemic arrhythmia as a cause of his syncope.  Concur with recommendation for cardiac catheterization Continue IV heparin for now along with aspirin Is on atenolol and statin at home, will continue current meds. With arrhythmia concern, keep K greater than 4 and mag greater than 2.  Chronic diastolic heart failure-seems to be euvolemic with NYHA class II symptoms. => Hold off on diuretic.  His ACE inhibitor was discontinued due to concerns of blood pressure and kidney function in the past.  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  If PCI is indicated, would use Plavix as opposed to Brilinta or Effient.  Also grade 2 follicular lymphoma with lymphadenopathy undergoing palliative radiation.  Informed Consent   Shared Decision  Making/Informed Consent The risks [stroke (1 in 1000), death (1 in 1000), kidney failure [usually temporary] (1 in 500), bleeding (1 in 200), allergic reaction [possibly serious] (1 in 200)], benefits (diagnostic support and management of coronary artery disease) and alternatives of a cardiac catheterization were discussed in detail with Harold [name] and he is willing to proceed.     Patient wanted me to talk to the daughter about consideration of catheterization.  They will talk and in discussed with the nurse.  If he is willing to proceed will have him sign consent and we will plan to try to proceed to cardiac catheterization today versus tomorrow.  Interval Summary  .    No further episodes of chest discomfort; no further syncope  Vital Signs .    Vitals:   01/29/23 0200 01/29/23 0310 01/29/23 0745 01/29/23 1136  BP:  130/62 119/61 (!) 137/55  Pulse:  63 (!) 59 63  Resp:  20 12 10   Temp: 97.7 F (36.5 C) 97.7 F (36.5 C) 97.9 F (36.6 C) 98 F (36.7 C)  TempSrc:  Oral Oral Oral  SpO2:  100% 99% 100%  Weight:      Height:        Intake/Output Summary (Last 24 hours) at 01/29/2023 1141 Last data filed at 01/29/2023 0925 Gross per 24 hour  Intake --  Output 250 ml  Net -250 ml      01/28/2023   10:18 PM 01/28/2023   10:00 PM 01/06/2023    2:07 PM  Last 3 Weights  Weight (lbs) 151 lb 11.2 oz 154 lb 1.6 oz 159 lb 6 oz  Weight (kg) 68.811 kg 69.9 kg 72.292 kg  Telemetry/ECG    SR - Personally Reviewed  Echo performed, not yet read   Echo 11/19/2022: EF 60 to 65%.  No RWMA.  GR 2 DD.  Normal PAP.  Mild MS and mild MR.  Moderate AoV calcification with no stenosis  Myoview February 2023:: Normal EF 65 to 70%-hyperdynamic.  Small apical defect with partial reversibility likely representing diaphragmatic attenuation but cannot exclude small apical ischemia.  LOW RISK.  Physical Exam .   GEN: No acute distress.   Neck: No JVD Cardiac: RRR, normal S1 and S2.  Cannot  exclude soft 1/6 SEM at RUSB.  Otherwise no murmurs, rubs, or gallops.  Respiratory: Clear to auscultation bilaterally. GI: Soft, nontender, non-distended  MS: No edema   Very long conversation-spent close to 35 minutes in the patient's room and 30 minutes in the chart as well as another 15-minute conversation with the patient's daughter.  Additional 15 minutes in documentation.  Total of an hour and a half spent with the patient/chart and phone calls.   For questions or updates, please contact [facility] Please consult [email or web address] for contact info under         Signed, Bryan Lemma, MD

## 2023-01-29 NOTE — Progress Notes (Signed)
Mobility Specialist Progress Note:   01/29/23 1400  Mobility  Activity Ambulated with assistance in hallway  Level of Assistance Contact guard assist, steadying assist  Assistive Device Front wheel walker  Distance Ambulated (ft) 100 ft  Activity Response Tolerated well  Mobility Referral Yes  $Mobility charge 1 Mobility  Mobility Specialist Start Time (ACUTE ONLY) 1400  Mobility Specialist Stop Time (ACUTE ONLY) 1422  Mobility Specialist Time Calculation (min) (ACUTE ONLY) 22 min    Pre Mobility: 64 HR,  100% SpO2 During Mobility: 89 HR,  96% SpO2 Post Mobility:  67 HR,  100% SpO2  Pt received in chair, agreeable to mobility. Asymptomatic w/ no complaints throughout. Successfully voided in BR upon returning to room. Pt left in chair with call bell and all needs met.  D'Vante Earlene Plater Mobility Specialist Please contact via Special educational needs teacher or Rehab office at [phone]

## 2023-01-29 NOTE — H&P (View-Only) (Signed)
Patient Name: Harold [name] Date of Encounter: 01/29/2023 [facility] Cardiologist: Verne Carrow, MD   Assessment & Plan .     Harold [name] is a 88 y.o. 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 01/28/2023 (as transfer from [facility]) for the evaluation of syncope and elevated troponin concerning for non-STEMI. Denied chest pain, but did have a sense of general unease that was relieved with nitroglycerin.  Syncope: Cardiogenic until proven otherwise.  Need to exclude arrhythmia and potentially ischemic arrhythmia especially in setting of elevated troponin Non-STEMI-had symptoms of but did not seem anginal in nature, but a sense of general unwellness relieved with nitroglycerin now with elevated troponin.  I agree with the admitting physician suggestion that this is potentially a type I MI and that we need to exclude ischemic arrhythmia as a cause of his syncope.  Concur with recommendation for cardiac catheterization Continue IV heparin for now along with aspirin Is on atenolol and statin at home, will continue current meds. With arrhythmia concern, keep K greater than 4 and mag greater than 2.  Chronic diastolic heart failure-seems to be euvolemic with NYHA class II symptoms. => Hold off on diuretic.  His ACE inhibitor was discontinued due to concerns of blood pressure and kidney function in the past.  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  If PCI is indicated, would use Plavix as opposed to Brilinta or Effient.  Also grade 2 follicular lymphoma with lymphadenopathy undergoing palliative radiation.  Informed Consent   Shared Decision  Making/Informed Consent The risks [stroke (1 in 1000), death (1 in 1000), kidney failure [usually temporary] (1 in 500), bleeding (1 in 200), allergic reaction [possibly serious] (1 in 200)], benefits (diagnostic support and management of coronary artery disease) and alternatives of a cardiac catheterization were discussed in detail with Harold [name] and he is willing to proceed.     Patient wanted me to talk to the daughter about consideration of catheterization.  They will talk and in discussed with the nurse.  If he is willing to proceed will have him sign consent and we will plan to try to proceed to cardiac catheterization today versus tomorrow.  Interval Summary  .    No further episodes of chest discomfort; no further syncope  Vital Signs .    Vitals:   01/29/23 0200 01/29/23 0310 01/29/23 0745 01/29/23 1136  BP:  130/62 119/61 (!) 137/55  Pulse:  63 (!) 59 63  Resp:  20 12 10   Temp: 97.7 F (36.5 C) 97.7 F (36.5 C) 97.9 F (36.6 C) 98 F (36.7 C)  TempSrc:  Oral Oral Oral  SpO2:  100% 99% 100%  Weight:      Height:        Intake/Output Summary (Last 24 hours) at 01/29/2023 1141 Last data filed at 01/29/2023 0925 Gross per 24 hour  Intake --  Output 250 ml  Net -250 ml      01/28/2023   10:18 PM 01/28/2023   10:00 PM 01/06/2023    2:07 PM  Last 3 Weights  Weight (lbs) 151 lb 11.2 oz 154 lb 1.6 oz 159 lb 6 oz  Weight (kg) 68.811 kg 69.9 kg 72.292 kg  Telemetry/ECG    SR - Personally Reviewed  Echo performed, not yet read   Echo 11/19/2022: EF 60 to 65%.  No RWMA.  GR 2 DD.  Normal PAP.  Mild MS and mild MR.  Moderate AoV calcification with no stenosis  Myoview February 2023:: Normal EF 65 to 70%-hyperdynamic.  Small apical defect with partial reversibility likely representing diaphragmatic attenuation but cannot exclude small apical ischemia.  LOW RISK.  Physical Exam .   GEN: No acute distress.   Neck: No JVD Cardiac: RRR, normal S1 and S2.  Cannot  exclude soft 1/6 SEM at RUSB.  Otherwise no murmurs, rubs, or gallops.  Respiratory: Clear to auscultation bilaterally. GI: Soft, nontender, non-distended  MS: No edema   Very long conversation-spent close to 35 minutes in the patient's room and 30 minutes in the chart as well as another 15-minute conversation with the patient's daughter.  Additional 15 minutes in documentation.  Total of an hour and a half spent with the patient/chart and phone calls.   For questions or updates, please contact [facility] Please consult [email or web address] for contact info under         Signed, Bryan Lemma, MD

## 2023-01-29 NOTE — Plan of Care (Signed)
  Problem: Clinical Measurements: Goal: Ability to maintain clinical measurements within normal limits will improve Outcome: Progressing Goal: Will remain free from infection Outcome: Progressing Goal: Diagnostic test results will improve Outcome: Progressing Goal: Respiratory complications will improve Outcome: Progressing Goal: Cardiovascular complication will be avoided Outcome: Progressing   Problem: Pain Management: Goal: General experience of comfort will improve Outcome: Progressing   Problem: Safety: Goal: Ability to remain free from injury will improve Outcome: Progressing

## 2023-01-29 NOTE — Plan of Care (Signed)
Problem: Education: Goal: Knowledge of General Education information will improve Description: Including pain rating scale, medication(s)/side effects and non-pharmacologic comfort measures 01/29/2023 2304 by Thea Gist, RN Outcome: Progressing 01/29/2023 2304 by Thea Gist, RN Outcome: Progressing   Problem: Health Behavior/Discharge Planning: Goal: Ability to manage health-related needs will improve 01/29/2023 2304 by Dan Dissinger, Gilford Raid, RN Outcome: Progressing 01/29/2023 2304 by Thea Gist, RN Outcome: Progressing   Problem: Clinical Measurements: Goal: Ability to maintain clinical measurements within normal limits will improve 01/29/2023 2304 by Kaidon Kinker, Gilford Raid, RN Outcome: Progressing 01/29/2023 2304 by Thea Gist, RN Outcome: Progressing Goal: Will remain free from infection 01/29/2023 2304 by Jevon Littlepage, Gilford Raid, RN Outcome: Progressing 01/29/2023 2304 by Thea Gist, RN Outcome: Progressing Goal: Diagnostic test results will improve 01/29/2023 2304 by Thea Gist, RN Outcome: Progressing 01/29/2023 2304 by Thea Gist, RN Outcome: Progressing Goal: Respiratory complications will improve 01/29/2023 2304 by Thea Gist, RN Outcome: Progressing 01/29/2023 2304 by Thea Gist, RN Outcome: Progressing Goal: Cardiovascular complication will be avoided 01/29/2023 2304 by Katrina Brosh, Gilford Raid, RN Outcome: Progressing 01/29/2023 2304 by Thea Gist, RN Outcome: Progressing   Problem: Activity: Goal: Risk for activity intolerance will decrease 01/29/2023 2304 by Carr Shartzer, Gilford Raid, RN Outcome: Progressing 01/29/2023 2304 by Thea Gist, RN Outcome: Progressing   Problem: Nutrition: Goal: Adequate nutrition will be maintained 01/29/2023 2304 by Thea Gist, RN Outcome: Progressing 01/29/2023 2304 by Thea Gist, RN Outcome: Progressing   Problem: Coping: Goal: Level  of anxiety will decrease 01/29/2023 2304 by Tahlia Deamer, Gilford Raid, RN Outcome: Progressing 01/29/2023 2304 by Thea Gist, RN Outcome: Progressing   Problem: Elimination: Goal: Will not experience complications related to bowel motility 01/29/2023 2304 by Thea Gist, RN Outcome: Progressing 01/29/2023 2304 by Thea Gist, RN Outcome: Progressing Goal: Will not experience complications related to urinary retention 01/29/2023 2304 by Little Winton, Gilford Raid, RN Outcome: Progressing 01/29/2023 2304 by Thea Gist, RN Outcome: Progressing   Problem: Pain Management: Goal: General experience of comfort will improve 01/29/2023 2304 by Jilliane Kazanjian, Gilford Raid, RN Outcome: Progressing 01/29/2023 2304 by Thea Gist, RN Outcome: Progressing   Problem: Safety: Goal: Ability to remain free from injury will improve 01/29/2023 2304 by Aylen Stradford, Gilford Raid, RN Outcome: Progressing 01/29/2023 2304 by Thea Gist, RN Outcome: Progressing   Problem: Skin Integrity: Goal: Risk for impaired skin integrity will decrease 01/29/2023 2304 by Charnetta Wulff, Gilford Raid, RN Outcome: Progressing 01/29/2023 2304 by Thea Gist, RN Outcome: Progressing   Problem: Education: Goal: Understanding of cardiac disease, CV risk reduction, and recovery process will improve 01/29/2023 2304 by Hawraa Stambaugh, Gilford Raid, RN Outcome: Progressing 01/29/2023 2304 by Thea Gist, RN Outcome: Progressing Goal: Individualized Educational Video(s) 01/29/2023 2304 by Thea Gist, RN Outcome: Progressing 01/29/2023 2304 by Thea Gist, RN Outcome: Progressing   Problem: Activity: Goal: Ability to tolerate increased activity will improve 01/29/2023 2304 by Alexzandria Massman, Gilford Raid, RN Outcome: Progressing 01/29/2023 2304 by Thea Gist, RN Outcome: Progressing   Problem: Cardiac: Goal: Ability to achieve and maintain adequate cardiovascular perfusion will  improve 01/29/2023 2304 by Azelia Reiger, Gilford Raid, RN Outcome: Progressing 01/29/2023 2304 by Thea Gist, RN Outcome: Progressing   Problem: Health Behavior/Discharge Planning: Goal: Ability to safely manage health-related needs after discharge will improve 01/29/2023 2304 by Lira Stephen, Gilford Raid, RN Outcome: Progressing 01/29/2023 2304 by Thea Gist, RN Outcome: Progressing   Problem: Education: Goal: Understanding of CV  disease, CV risk reduction, and recovery process will improve 01/29/2023 2304 by Zaviyar Rahal, Gilford Raid, RN Outcome: Progressing 01/29/2023 2304 by Thea Gist, RN Outcome: Progressing Goal: Individualized Educational Video(s) Outcome: Progressing   Problem: Activity: Goal: Ability to return to baseline activity level will improve Outcome: Progressing   Problem: Cardiovascular: Goal: Ability to achieve and maintain adequate cardiovascular perfusion will improve Outcome: Progressing Goal: Vascular access site(s) Level 0-1 will be maintained Outcome: Progressing   Problem: Health Behavior/Discharge Planning: Goal: Ability to safely manage health-related needs after discharge will improve Outcome: Progressing   Problem: Education: Goal: Understanding of CV disease, CV risk reduction, and recovery process will improve Outcome: Progressing Goal: Individualized Educational Video(s) Outcome: Progressing   Problem: Activity: Goal: Ability to return to baseline activity level will improve Outcome: Progressing   Problem: Cardiovascular: Goal: Ability to achieve and maintain adequate cardiovascular perfusion will improve Outcome: Progressing Goal: Vascular access site(s) Level 0-1 will be maintained Outcome: Progressing   Problem: Health Behavior/Discharge Planning: Goal: Ability to safely manage health-related needs after discharge will improve Outcome: Progressing

## 2023-01-29 NOTE — Progress Notes (Signed)
PHARMACY - ANTICOAGULATION CONSULT NOTE  Pharmacy Consult for heparin Indication:  ACS and h/o DVT  Labs: Recent Labs    01/28/23 2326 01/29/23 0629  HGB 10.0*  --   HCT 31.5*  --   PLT 234  --   APTT  --  35  HEPARINUNFRC  --  0.28*  CREATININE 1.04  --     Medical History: Past Medical History:  Diagnosis Date   BPH (benign prostatic hypertrophy)    CAD (coronary artery disease) 01/2010   S/P drug eluting stents LAD and PL branch of [facility] Dr. 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   01/27/2023   atenolol (TENORMIN) 25 MG tablet Take 1 tablet (25 mg total) by mouth daily. 90 tablet 3 01/28/2023   finasteride (PROSCAR) 5 MG tablet Take 5 mg by mouth daily.   01/27/2023   furosemide (LASIX) 20 MG tablet TAKE 1 TABLET BY MOUTH TWICE  DAILY 180 tablet 3 01/27/2023   potassium chloride SA (KLOR-CON M) 20 MEQ tablet Take 1 tablet (20 mEq total) by mouth daily. Please keep upcoming appointment for future refills. Thank you.   01/27/2023   pravastatin (PRAVACHOL) 40 MG tablet Take 1 tablet (40 mg total) by mouth at bedtime. 90 tablet 3 01/27/2023   brimonidine (ALPHAGAN) 0.2 % ophthalmic solution Place 1 drop into the right eye 2 (two) times daily.   Unknown   cholecalciferol (VITAMIN D) 1000 UNITS tablet Take 1,000 Units by mouth daily.   Unknown   Cyanocobalamin (B-12) 2500 MCG TABS Take 2,500 mcg by mouth daily.   Unknown   gabapentin (NEURONTIN) 100 MG capsule Take 1 capsule by mouth 2 (two) times daily as needed (for knee pain).   Unknown   nitroGLYCERIN (NITROSTAT) 0.4 MG SL tablet Place 1 tablet (0.4 mg total) under the tongue every 5 (five) minutes as needed for chest pain.  25 tablet 6    Scheduled:   aspirin EC  81 mg Oral Daily   atenolol  25 mg Oral Daily   brimonidine  1 drop Right Eye BID   finasteride  5 mg Oral Daily   heparin  2,000 Units Intravenous Once   pravastatin  40 mg Oral QHS    Assessment: 88yo male presented to [facility] c/o syncopal events without chest discomfort, but troponins found to be elevated and rising in pattern c/w NSTEMI; pt was started on heparin and tx'd to [facility] for further cardiac w/u, to continue heparin; pt is on apixaban PTA for h/o DVT.  Heparin was started with bolus of 4200 units followed by infusion at 12 units/kg/hr (842 units/hr); initial PTT was 55 with "heparin correlation" reported as 0.3; per OSH Epic records, heparin bolus/infusion started 11/12 0028 and labs were drawn at 0525 (would expect bolus to still affect AC labs at this point w/ pt's age and renal function), no confirmation labs reported.  Pertinent labs from OSH: WBC 9.6, Hgb 10.6, Plt 259, trop-I 0.291>0.949>1.230, SCr 1.1.  Heparin level and PTT came back subtherapeutic this AM. We will give small bolus and increase rate. F/u with cath plan.  Goal of Therapy:  Heparin level 0.3-0.7 units/ml aPTT 66-102 seconds Monitor platelets by anticoagulation protocol: Yes   Plan:  Heparin bolus 2000 units x1 Increase heparin to 1000 units/hr  Check 8 hr HL/PTT or cath Monitor heparin levels, aPTT (while DOAC affects anti-Xa assay), and CBC.  Ulyses Southward, PharmD, BCIDP, AAHIVP, CPP Infectious Disease Pharmacist 01/29/2023 8:58 AM

## 2023-01-29 NOTE — Progress Notes (Signed)
Echocardiogram 2D Echocardiogram has been performed.  Casimiro Needle P Eda Magnussen 01/29/2023, 10:01 AM

## 2023-01-29 NOTE — Interval H&P Note (Signed)
History and Physical Interval Note:  01/29/2023 4:03 PM  Harold [name]  has presented today for surgery, with the diagnosis of unstable angina.  The various methods of treatment have been discussed with the patient and family. After consideration of risks, benefits and other options for treatment, the patient has consented to  Procedure(s): LEFT HEART CATH AND CORONARY ANGIOGRAPHY (N/A) as a surgical intervention.  The patient's history has been reviewed, patient examined, no change in status, stable for surgery.  I have reviewed the patient's chart and labs.  Questions were answered to the patient's satisfaction.    Cath Lab Visit (complete for each Cath Lab visit)  Clinical Evaluation Leading to the Procedure:   ACS: Yes.    Non-ACS:    Anginal Classification: CCS II  Anti-ischemic medical therapy: Minimal Therapy (1 class of medications)  Non-Invasive Test Results: No non-invasive testing performed  Prior CABG: No previous CABG        Verne Carrow

## 2023-01-29 NOTE — Progress Notes (Signed)
PHARMACY - ANTICOAGULATION CONSULT NOTE  Pharmacy Consult for heparin Indication:  ACS and h/o DVT  Labs: Recent Labs    01/28/23 2326 01/29/23 0629 01/29/23 2010  HGB 10.0*  --   --   HCT 31.5*  --   --   PLT 234  --   --   APTT  --  35 160*  HEPARINUNFRC  --  0.28* 0.79*  CREATININE 1.04  --   --     Medical History: Past Medical History:  Diagnosis Date   BPH (benign prostatic hypertrophy)    CAD (coronary artery disease) 01/2010   S/P drug eluting stents LAD and PL branch of RCA Dr. 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   01/27/2023 at 14:00   atenolol (TENORMIN) 25 MG tablet Take 1 tablet (25 mg total) by mouth daily. 90 tablet 3 Past Week   brimonidine (ALPHAGAN) 0.2 % ophthalmic solution Place 1 drop into the right eye 2 (two) times daily.   Past Week   cholecalciferol (VITAMIN D) 1000 UNITS tablet Take 1,000 Units by mouth daily.   Past Week   Cyanocobalamin (B-12) 2500 MCG TABS Take 2,500 mcg by mouth daily.   Past Week   finasteride (PROSCAR) 5 MG tablet Take 5 mg by mouth daily.   Past Week   furosemide (LASIX) 20 MG tablet TAKE 1 TABLET BY MOUTH TWICE  DAILY 180 tablet 3 Past Week   gabapentin (NEURONTIN) 100 MG capsule Take 1 capsule by mouth 2 (two) times daily as needed (for knee pain).   unknown   lisinopril (ZESTRIL) 5 MG tablet Take 5 mg by mouth daily.   unknown   nitroGLYCERIN (NITROSTAT) 0.4 MG SL tablet Place 1 tablet (0.4 mg total) under the tongue every 5 (five) minutes as needed for chest pain. 25 tablet 6 Past Week   potassium chloride SA (KLOR-CON M) 20 MEQ tablet Take 1 tablet (20 mEq total) by mouth daily. Please keep upcoming appointment for  future refills. Thank you.   Past Week   pravastatin (PRAVACHOL) 40 MG tablet Take 1 tablet (40 mg total) by mouth at bedtime. 90 tablet 3 Past Week   Scheduled:   aspirin EC  81 mg Oral Daily   atenolol  25 mg Oral Daily   brimonidine  1 drop Right Eye BID   [START ON 01/30/2023] clopidogrel  75 mg Oral Q breakfast   finasteride  5 mg Oral Daily   pravastatin  40 mg Oral QHS   sodium chloride flush  3 mL Intravenous Q12H    Assessment: 88yo male presented to [facility] c/o syncopal events without chest discomfort, but troponins found to be elevated and rising in pattern c/w NSTEMI; pt was started on heparin and tx'd to [facility] for further cardiac w/u, to continue heparin; pt is on apixaban PTA for h/o DVT.  Heparin was started with bolus of 4200 units followed by infusion at 12 units/kg/hr (842 units/hr); initial PTT was 55 with "heparin correlation" reported as 0.3; per OSH Epic records, heparin bolus/infusion started 11/12 0028 and labs were drawn at 0525 (would expect bolus to still affect  AC labs at this point w/ pt's age and renal function), no confirmation labs reported.  Pertinent labs from OSH: WBC 9.6, Hgb 10.6, Plt 259, trop-I 0.291>0.949>1.230, SCr 1.1.  11/13 PM: s/p cath, heparin ordered to resume 2hr after TR band removed (currently with 5cc in band).  Goal of Therapy:  Heparin level 0.3-0.7 units/ml aPTT 66-102 seconds Monitor platelets by anticoagulation protocol: Yes   Plan:  2 hours after TR band removed: Resume heparin 1000 units/hr (no bolus) Check 8 hr HL/PTT  Monitor heparin levels, aPTT (while DOAC affects anti-Xa assay), and CBC.  Loralee Pacas, PharmD, BCPS 01/29/2023 10:25 PM  Please check AMION for all [facility] Pharmacy phone numbers After 10:00 PM, call Main Pharmacy [phone]

## 2023-01-29 NOTE — Plan of Care (Signed)
  Problem: Education: Goal: Knowledge of General Education information will improve Description: Including pain rating scale, medication(s)/side effects and non-pharmacologic comfort measures Outcome: Progressing   Problem: Activity: Goal: Ability to tolerate increased activity will improve Outcome: Progressing   Problem: Education: Goal: Understanding of CV disease, CV risk reduction, and recovery process will improve Outcome: Progressing   Problem: Cardiovascular: Goal: Vascular access site(s) Level 0-1 will be maintained Outcome: Progressing

## 2023-01-30 ENCOUNTER — Inpatient Hospital Stay (HOSPITAL_COMMUNITY)
Admit: 2023-01-30 | Discharge: 2023-01-30 | Disposition: A | Discharge status: Home / Self Care | Payer: Medicare Other | Attending: Cardiology | Admitting: Cardiology

## 2023-01-30 ENCOUNTER — Encounter (HOSPITAL_COMMUNITY): Payer: Self-pay | Admitting: Cardiovascular Disease

## 2023-01-30 ENCOUNTER — Other Ambulatory Visit: Payer: Self-pay | Admitting: Cardiology

## 2023-01-30 ENCOUNTER — Other Ambulatory Visit (HOSPITAL_COMMUNITY): Payer: Self-pay

## 2023-01-30 DIAGNOSIS — E782 Mixed hyperlipidemia: Secondary | ICD-10-CM

## 2023-01-30 DIAGNOSIS — Z9861 Coronary angioplasty status: Secondary | ICD-10-CM

## 2023-01-30 DIAGNOSIS — R55 Syncope and collapse: Secondary | ICD-10-CM | POA: Diagnosis not present

## 2023-01-30 DIAGNOSIS — I214 Non-ST elevation (NSTEMI) myocardial infarction: Secondary | ICD-10-CM | POA: Diagnosis not present

## 2023-01-30 HISTORY — DX: Coronary angioplasty status: Z98.61

## 2023-01-30 LAB — BASIC METABOLIC PANEL
Anion gap: 6 (ref 5–15)
BUN: 16 mg/dL (ref 8–23)
CO2: 25 mmol/L (ref 22–32)
Calcium: 8.3 mg/dL — ABNORMAL LOW (ref 8.9–10.3)
Chloride: 108 mmol/L (ref 98–111)
Creatinine, Ser: 1 mg/dL (ref 0.61–1.24)
GFR, Estimated: >60 mL/min (ref >60)
Glucose, Bld: 99 mg/dL (ref 70–99)
Potassium: 3.5 mmol/L (ref 3.5–5.1)
Sodium: 139 mmol/L (ref 135–145)

## 2023-01-30 LAB — CBC
HCT: 30.2 % — ABNORMAL LOW (ref 39.0–52.0)
Hemoglobin: 9.5 g/dL — ABNORMAL LOW (ref 13.0–17.0)
MCH: 27 pg (ref 26.0–34.0)
MCHC: 31.5 g/dL (ref 30.0–36.0)
MCV: 85.8 fL (ref 80.0–100.0)
Platelets: 207 10*3/uL (ref 150–400)
RBC: 3.52 MIL/uL — ABNORMAL LOW (ref 4.22–5.81)
RDW: 13.8 % (ref 11.5–15.5)
WBC: 6.1 10*3/uL (ref 4.0–10.5)
nRBC: 0 % (ref 0.0–0.2)

## 2023-01-30 LAB — HEPARIN LEVEL (UNFRACTIONATED): Heparin Unfractionated: 0.14 [IU]/mL — ABNORMAL LOW (ref 0.30–0.70)

## 2023-01-30 LAB — APTT: aPTT: 38 s — ABNORMAL HIGH (ref 24–36)

## 2023-01-30 MED ORDER — APIXABAN 5 MG PO TABS
5.0000 mg | ORAL_TABLET | Freq: Two times a day (BID) | ORAL | Status: DC
  Administered 2023-01-30: 5 mg via ORAL
  Filled 2023-01-30: qty 1

## 2023-01-30 MED ORDER — CLOPIDOGREL BISULFATE 75 MG PO TABS
75.0000 mg | ORAL_TABLET | Freq: Every day | ORAL | 2 refills | Status: DC
  Filled 2023-01-30: qty 90, 90d supply, fill #0

## 2023-01-30 MED ORDER — TICAGRELOR 90 MG PO TABS
90.0000 mg | ORAL_TABLET | Freq: Two times a day (BID) | ORAL | 1 refills | Status: DC
  Filled 2023-01-30: qty 60, 30d supply, fill #0

## 2023-01-30 NOTE — TOC Benefit Eligibility Note (Signed)
Patient Product/process development scientist completed.    The patient is insured through [facility]. Patient has Medicare and is not eligible for a copay card, but may be able to apply for patient assistance, if available.    Ran test claim for Brilinta 90 mg and the current 30 day co-pay is $47.00.   This test claim was processed through [facility]- copay amounts may vary at other pharmacies due to pharmacy/plan contracts, or as the patient moves through the different stages of their insurance plan.     Roland Earl, CPHT Pharmacy Technician III Certified Patient Advocate [facility] Patient Advocate Team Direct Number: [phone]  Fax: [phone]

## 2023-01-30 NOTE — Progress Notes (Signed)
Went over AVS with patient and family, answered all questions, removed ivs, gathered belongings and patient got dressed, TOC meds were given to patient before patient was rolled out to family members car.

## 2023-01-30 NOTE — Discharge Summary (Addendum)
Discharge Summary    Patient ID: Harold [name] MRN: [id number]; DOB: Aug 04, 1934  Admit date: 01/28/2023 Discharge date: 01/30/2023  PCP:  Harold Alvine, MD   [facility] Providers Cardiologist:  Harold Carrow, MD     Discharge Diagnoses    Principal Problem:   NSTEMI (non-ST elevated myocardial infarction) [facility]) Active Problems:   HYPERTENSION, BENIGN ESSENTIAL   HLD (hyperlipidemia)   Syncope   Diagnostic Studies/Procedures    Cath: 01/29/2023:   Ost RCA to Prox RCA lesion is 60% stenosed.  Mid RCA lesion is 30% stenosed.   Prox Cx lesion is 40% stenosed.  Mid LAD-2 lesion is 30% stenosed.  Mid LAD-1 lesion is 80% stenosed =>  Balloon angioplasty was performed using a BALL SAPPHIRE NC24 3.0X12 with shockwave lithotripsy (no stent placed because inability to fully expand lesion; Post intervention, there is a 20% residual stenosis.).   Dist LAD-1 lesion is 50% stenosed.  Dist LAD-2 lesion is 100% stenosed. Severe mid LAD stenosis just before the previously placed stent. This lesion is heavily calcified. The stenosis extends into the proximal edge of the stent. The distal LAD is chronically occluded 2. Successful intracoronary lithotripsy of the heavily calcified mid LAD stenosis. I could not fully expand this lesion despite treatment with lithotripsy and non-compliant balloon inflation. Orbital atherectomy was not a good option given the proximity to the previously placed mid LAD stent. I did not place a stent since the lesion was not fully expanded.  3. The Circumflex has mild proximal stenosis 4. The RCA is a large dominant vessel with moderate ostial/proximal stenosis, mild mid vessel stenosis.    Recommendations: I would continue ASA/Plavix for now but would stop ASA once his Eliquis has been restarted. Will restart IV heparin tonight.    Diagnostic        Intervention Dominance: Right     Echo: 01/29/2023 IMPRESSIONS: Normal LV has function with EF 55 to  60%.  GR 2 DD.  Mildly enlarged RV with mildly elevated RVP but moderate to severely elevated RAP.  Mild biatrial enlargement.  Moderate to severe MR.  Mild MS with mean MVG 3.4 mmHg.  Myxomatous TV with leaflet prolapse.  Moderate AoV calcification with mild AS. Comparison(s): Changes from prior study are noted. EF unchanged. RVSP elevated on this study.  _____________   History of Present Illness     Harold [name] is a 88 y.o. 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   [name] stated that he was in his usual state of health until Monday, 01/27/2023.  On Monday evening, he had just finished loading wood into his furnace and was standing at his refrigerator.  The next thing he remembered was being on the ground.  He denies any symptoms immediately preceding loss of consciousness.  Stated that he hit his head and elbow.  He called his daughter who helped him off the ground and ultimately took him to the [facility] emergency room.  Denies previous syncope.  Stated that about 10 days ago, he had an episode of generally feeling unwell which prompted him to take a nitroglycerin with resolution of symptoms.  This happened again around 3-4 days ago with similar response to sublingual nitroglycerin.  He had not required sublingual nitroglycerin  in a significant amount of time prior to these events.  Denied any chest pain at rest or on exertion or dyspnea at rest or on exertion recently.   On arrival to [facility] ED, CBC with WBC 9.6, Hgb 10.6 (baseline), PLT 259.  INR 1.21.  CMP essentially unremarkable.  Troponin I 0.291->0.949->1.230->1.220.  CXR report with diffuse moderate right greater than left peribronchial thickening,  no pleural effusion or consolidation.  CT head performed with report indicating no acute intracranial abnormality, with evidence of sequela of senescent small vessel ischemic disease.  He was given aspirin 324 mg, 4 mg Zofran and 500 mL of normal saline.  Also started on IV heparin.     Has a history of PCI to LAD and PL branch of RCA in 01/2010 with additional findings of complete occlusion of the distal LAD, performed in the context of chest pain.   Last seen by Dr. Clifton [name] on 12/23/2022.  Lisinopril stopped due to recent low blood pressures, continued on atenolol and statin.  Instructed to stop aspirin at that time and continue apixaban.  He was admitted to [facility] in 04/2021 with chest pain and mild troponin evaluation.  Outpatient pharmacologic stress 04/27/2021 with no large areas of ischemia (apical partially reversible defect, likely artifact vs known distal LAD occlusion).  Of note, he has noted to be a nonresponder to Plavix.   Has been receiving radiation treatment for inguinal lymphadenopathy resulting in lower extremity edema and scrotal swelling.  Had an ED visit on 12/13/2022 for cellulitis and was prescribed antibiotics at that time.  Last seen by oncology in 12/2022 with plan for continued palliative radiation therapy to the right inguinal area/pelvis with no additional plans for systemic medical treatment at this time.   Medications include (but not limited to) apixaban 5 mg twice daily, atenolol 25 mg daily, Lasix 20 mg twice daily pravastatin 40 mg daily.   Denies previous history of tobacco use.  Very seldom alcohol use.  Lives alone with family nearby.   Hospital Course     Non-STEMI CAD s/p  -- Troponin I peaked at 1.2, underwent cardiac catheterization noted above with severe mid LAD stenosis just before previously placed stent that was treated with intracoronary lithotripsy and balloon angioplasty.  Recommendations for aspirin and Plavix immediately post cath.  Will  stop aspirin at discharge and resume his Eliquis with continuation of Plavix.  In review of chart, he has been noted to be a plavix non-responder previously. Case discussed with Dr. Herbie Baltimore, we will transition him to [facility] though not ideal with the need for [facility], as this is the only option. He will load with 180mg  x1 tomorrow morning, then 90mg  BID afterwards. 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 Need to exclude sinus pause or complete heart block as etiology. Low threshold to discontinue atenolol -- Plan for 28-day live ZIO monitoring at discharge.  First 14-day monitor placed prior to discharge.  Second monitor will  be mailed out to the patient to place in the outpatient setting.  History of DVT -- recent outpatient dopplers with PCP for recurrent DVT -- Resumed on Eliquis 5 mg twice daily at discharge  Grade 2 follicular lymphoma with lymphedema -- Undergoing palliative radiation  Patient seen by Dr. Herbie Baltimore and deemed stable for discharge home.  Follow-up arranged in the office.  Medication sent to [facility] pharmacy.  Educated by [employer].D. prior to discharge.  Did the patient have an acute coronary syndrome (MI, NSTEMI, STEMI, etc) this admission?:  Yes                               AHA/ACC ACS Clinical Performance & Quality Measures: Aspirin prescribed? - No - on OAC ADP Receptor Inhibitor  (Plavix/Clopidogrel, Brilinta/Ticagrelor or Effient/Prasugrel) prescribed (includes medically managed patients)? - Yes Beta Blocker prescribed? - Yes High Intensity Statin (Lipitor 40-80mg  or Crestor 20-40mg ) prescribed? - Yes EF assessed during THIS hospitalization? - Yes For EF <40%, was ACEI/ARB prescribed? - Not Applicable (EF >/= 40%) For EF <40%, Aldosterone Antagonist (Spironolactone or Eplerenone) prescribed? - Not Applicable (EF >/= 40%) Cardiac Rehab Phase II ordered (including medically managed patients)? - Yes   The patient will be scheduled for a TOC follow up appointment in 10-14 days.  A message has been sent to the [facility] and Scheduling Pool at the office where the patient should be seen for follow up.  _____________  Discharge Vitals Blood pressure 126/65, pulse 62, temperature 98.1 F (36.7 C), temperature source Oral, resp. rate 16, height 5\' 10"  (1.778 m), weight 68.8 kg, SpO2 100%.  Filed Weights   01/28/23 2200 01/28/23 2218  Weight: 69.9 kg 68.8 kg    Labs & Radiologic Studies    CBC Recent Labs    01/28/23 2326 01/30/23 0239  WBC 5.9 6.1  NEUTROABS 3.9  --   HGB 10.0* 9.5*  HCT 31.5* 30.2*  MCV 86.1 85.8  PLT 234 207   Basic Metabolic Panel Recent Labs    47/82/95 2326 01/30/23 0239  NA 143 139  K 3.3* 3.5  CL 109 108  CO2 29 25  GLUCOSE 106* 99  BUN 20 16  CREATININE 1.04 1.00  CALCIUM 8.6* 8.3*  MG 1.8  --    Liver Function Tests No results for input(s): "AST", "ALT", "ALKPHOS", "BILITOT", "PROT", "ALBUMIN" in the last 72 hours. No results for input(s): "LIPASE", "AMYLASE" in the last 72 hours. High Sensitivity Troponin:   No results for input(s): "TROPONINIHS" in the last 720 hours.  BNP Invalid input(s): "POCBNP" D-Dimer No results for input(s): "DDIMER" in the last 72 hours. Hemoglobin A1C No results for input(s): "HGBA1C" in the last 72 hours. Fasting Lipid Panel No results for input(s): "CHOL", "HDL", "LDLCALC", "TRIG",  "CHOLHDL", "LDLDIRECT" in the last 72 hours. Thyroid Function Tests No results for input(s): "TSH", "T4TOTAL", "T3FREE", "THYROIDAB" in the last 72 hours.  Invalid input(s): "FREET3" _____________   Disposition   Pt is being discharged home today in good condition.  Follow-up Plans & Appointments     Discharge Instructions     Amb Referral to Cardiac Rehabilitation   Complete by: As directed    Diagnosis:  NSTEMI PTCA     After initial evaluation and assessments completed: Virtual Based Care may be provided alone or in conjunction with Phase 2 Cardiac Rehab based on patient barriers.: Yes   Intensive Cardiac Rehabilitation (ICR) [facility] location only OR  Traditional Cardiac Rehabilitation (TCR) *If criteria for ICR are not met will enroll in TCR [facility] only): Yes   Call MD for:  redness, tenderness, or signs of infection (pain, swelling, redness, odor or green/yellow discharge around incision site)   Complete by: As directed    Diet - low sodium heart healthy   Complete by: As directed    Discharge instructions   Complete by: As directed    Radial Site Care Refer to this sheet in the next few weeks. These instructions provide you with information on caring for yourself after your procedure. Your caregiver may also give you more specific instructions. Your treatment has been planned according to current medical practices, but problems sometimes occur. Call your caregiver if you have any problems or questions after your procedure. HOME CARE INSTRUCTIONS You may shower the day after the procedure. Remove the bandage (dressing) and gently wash the site with plain soap and water. Gently pat the site dry.  Do not apply powder or lotion to the site.  Do not submerge the affected site in water for 3 to 5 days.  Inspect the site at least twice daily.  Do not flex or bend the affected arm for 24 hours.  No lifting over 5 pounds (2.3 kg) for 5 days after your procedure.  Do not drive home if you  are discharged the same day of the procedure. Have someone else drive you.  You may drive 24 hours after the procedure unless otherwise instructed by your caregiver.  What to expect: Any bruising will usually fade within 1 to 2 weeks.  Blood that collects in the tissue (hematoma) may be painful to the touch. It should usually decrease in size and tenderness within 1 to 2 weeks.  SEEK IMMEDIATE MEDICAL CARE IF: You have unusual pain at the radial site.  You have redness, warmth, swelling, or pain at the radial site.  You have drainage (other than a small amount of blood on the dressing).  You have chills.  You have a fever or persistent symptoms for more than 72 hours.  You have a fever and your symptoms suddenly get worse.  Your arm becomes pale, cool, tingly, or numb.  You have heavy bleeding from the site. Hold pressure on the site.   PLEASE DO NOT MISS ANY DOSES OF YOUR PLAVIX!!!!! Also keep a log of you blood pressures and bring back to your follow up appt. Please call the office with any questions.   Patients taking blood thinners should generally stay away from medicines like ibuprofen, Advil, Motrin, naproxen, and Aleve due to risk of stomach bleeding. You may take Tylenol as directed or talk to your primary doctor about alternatives.   PLEASE ENSURE THAT YOU DO NOT RUN OUT OF YOUR PLAVIX. This medication is very important to remain on for at least one year. IF you have issues obtaining this medication due to cost please CALL the office 3-5 business days prior to running out in order to prevent missing doses of this medication.   Increase activity slowly   Complete by: As directed         Discharge Medications   Allergies as of 01/30/2023   No Known Allergies      Medication List     STOP taking these medications    lisinopril 5 MG tablet Commonly known as: ZESTRIL       TAKE these medications    apixaban 5 MG Tabs tablet Commonly known as: ELIQUIS Take  5 mg  by mouth 2 (two) times daily.   atenolol 25 MG tablet Commonly known as: TENORMIN Take 1 tablet (25 mg total) by mouth daily.   B-12 2500 MCG Tabs Take 2,500 mcg by mouth daily.   brimonidine 0.2 % ophthalmic solution Commonly known as: ALPHAGAN Place 1 drop into the right eye 2 (two) times daily.   cholecalciferol 1000 units tablet Commonly known as: VITAMIN D Take 1,000 Units by mouth daily.   finasteride 5 MG tablet Commonly known as: PROSCAR Take 5 mg by mouth daily.   furosemide 20 MG tablet Commonly known as: LASIX TAKE 1 TABLET BY MOUTH TWICE  DAILY   gabapentin 100 MG capsule Commonly known as: NEURONTIN Take 1 capsule by mouth 2 (two) times daily as needed (for knee pain).   nitroGLYCERIN 0.4 MG SL tablet Commonly known as: Nitrostat Place 1 tablet (0.4 mg total) under the tongue every 5 (five) minutes as needed for chest pain.   potassium chloride SA 20 MEQ tablet Commonly known as: KLOR-CON M Take 1 tablet (20 mEq total) by mouth daily. Please keep upcoming appointment for future refills. Thank you.   pravastatin 40 MG tablet Commonly known as: PRAVACHOL Take 1 tablet (40 mg total) by mouth at bedtime.   ticagrelor 90 MG Tabs tablet Commonly known as: Brilinta Take 1 tablet (90 mg total) by mouth 2 (two) times daily. Notes to patient: Please take 2 tablets once tomorrow (11/15) morning then one tablet that evening. Then start one table twice a day after that on 11/16        Outstanding Labs/Studies   Cardiac monitor   Duration of Discharge Encounter   Greater than 30 minutes including physician time.  Signed, Laverda Page, NP 01/30/2023, 2:35 PM   ATTENDING ATTESTATION  I have seen, examined and evaluated the patient along with Laverda Page, NP on rounds..  After reviewing all the available data and chart, we discussed the patients laboratory, study & physical findings as well as symptoms in detail.  I agree with her findings,  examination as well as impression recommendations as per our discussion.    Attending adjustments noted in italics.   Patient came in with syncope but also few episodes of what sound like anginal chest pain.  Given his history of CAD, he underwent catheterization revealing 80% stenosis in the LAD proximal to previous stent.  Treated with balloon angioplasty and shockwave lithotripsy gypsy reduced from 80% to 20%.  Stent not placed due to inability to fully expand lesion.  Due to the proximity of previous stent, felt not to be appropriate for atherectomy.  (If symptoms were to recur, we can potentially consider short run of atherectomy followed by PCI.)  As for the question of syncope, the still did not answer if syncope occurred as a result of ischemia versus cardiac arrhythmia such as bradycardia.  Will therefore plan for 28-day Zio patch monitoring with 2 back-to-back 14 day monitors.  The first 1 being fitted prior to discharge.  The other issue is the fact in the past he was a Plavix nonresponder therefore we we will use Brilinta loading tomorrow.  Will stop aspirin and continue on DOAC because of history of recurrent DVT and ongoing malignancy.  Based on him being only PTCA, can probably stop Brilinta after 3 months, and return to aspirin    Marykay Lex, MD, MS Bryan Lemma, M.D., M.S. Interventional Cardiologist  [facility]  Pager # [phone] Phone # [phone] [place], [place] [address]

## 2023-01-30 NOTE — Progress Notes (Signed)
Mobility Specialist Progress Note:   01/30/23 0907  Mobility  Activity Ambulated with assistance in hallway  Level of Assistance Contact guard assist, steadying assist  Assistive Device Front wheel walker  Distance Ambulated (ft) 100 ft  Activity Response Tolerated well  Mobility Referral Yes  $Mobility charge 1 Mobility  Mobility Specialist Start Time (ACUTE ONLY) 0850  Mobility Specialist Stop Time (ACUTE ONLY) 0903  Mobility Specialist Time Calculation (min) (ACUTE ONLY) 13 min    Pre Mobility: 98 HR During Mobility: 105 HR Post Mobility:  81 HR  Pt received in BR, agreeable to mobility. Able to perform pericare independently w/ standby assist for standing. C/o some knee pain, but got better with ambulation. Asymptomatic throughout and returned to room w/o fault. Pt left in chair with call bell and all needs met.  D'Vante Earlene Plater Mobility Specialist Please contact via Special educational needs teacher or Rehab office at [phone]

## 2023-01-30 NOTE — Progress Notes (Signed)
28 day Zio monitor orders placed at discharge. Dr.McAlhany to read, Dx syncope

## 2023-01-30 NOTE — Addendum Note (Signed)
Addended by: Laverda Page B on: 01/30/2023 12:24 PM   Modules accepted: Orders

## 2023-01-30 NOTE — Progress Notes (Signed)
CARDIAC REHAB PHASE I   PRE:  Rate/Rhythm: 67 NSR  BP:  Sitting: 120/62      SpO2: 100 RA  MODE:  Ambulation: 100 ft    POST:  Rate/Rhythm: 78 NSR  BP:  Sitting: 125/52      SpO2: 100 RA  Pt ambulated with standby assistance, pt helped to BR then ambulated in the hallway wit RW and gait belt for safety. Pt returned to chair and educated.  Pt received MI book and education on NSTEMI, PTCA, restrictions, ex guidelines, HH diet, and CRP2. Will refer to [facility]  MS, ACSM-CEP 1:39 PM 01/30/2023    Service time is from 1300 to 1339.

## 2023-01-30 NOTE — Progress Notes (Signed)
Mobility Specialist Progress Note:   01/30/23 0900  Mobility  Activity Ambulated with assistance to bathroom  Level of Assistance Contact guard assist, steadying assist  Assistive Device Front wheel walker  Distance Ambulated (ft) 15 ft  Activity Response Tolerated well  Mobility Referral Yes  $Mobility charge 1 Mobility  Mobility Specialist Start Time (ACUTE ONLY) [id number]  Mobility Specialist Stop Time (ACUTE ONLY) 0841  Mobility Specialist Time Calculation (min) (ACUTE ONLY) 7 min    Pt received in chair, requesting assistance to BR. Asymptomatic w/ no complaints. Pt left in BR w/ instructions to pull call string. NT aware.  D'Vante Earlene Plater Mobility Specialist Please contact via Special educational needs teacher or Rehab office at [phone]

## 2023-01-30 NOTE — Progress Notes (Addendum)
PHARMACY - ANTICOAGULATION CONSULT NOTE  Pharmacy Consult for heparin>>apixaban Indication:  ACS and h/o DVT  Labs: Recent Labs    01/28/23 2326 01/29/23 0629 01/29/23 2010 01/30/23 0239 01/30/23 1041  HGB 10.0*  --   --  9.5*  --   HCT 31.5*  --   --  30.2*  --   PLT 234  --   --  207  --   APTT  --  35 160*  --  38*  HEPARINUNFRC  --  0.28* 0.79*  --  0.14*  CREATININE 1.04  --   --  1.00  --     Medical History: Past Medical History:  Diagnosis Date   BPH (benign prostatic hypertrophy)    CAD (coronary artery disease) 01/2010   S/P drug eluting stents LAD and PL branch of RCA Dr. 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   01/27/2023 at 14:00   atenolol (TENORMIN) 25 MG tablet Take 1 tablet (25 mg total) by mouth daily. 90 tablet 3 Past Week   brimonidine (ALPHAGAN) 0.2 % ophthalmic solution Place 1 drop into the right eye 2 (two) times daily.   Past Week   cholecalciferol (VITAMIN D) 1000 UNITS tablet Take 1,000 Units by mouth daily.   Past Week   Cyanocobalamin (B-12) 2500 MCG TABS Take 2,500 mcg by mouth daily.   Past Week   finasteride (PROSCAR) 5 MG tablet Take 5 mg by mouth daily.   Past Week   furosemide (LASIX) 20 MG tablet TAKE 1 TABLET BY MOUTH TWICE  DAILY 180 tablet 3 Past Week   gabapentin (NEURONTIN) 100 MG capsule Take 1 capsule by mouth 2 (two) times daily as needed (for knee pain).   unknown   lisinopril (ZESTRIL) 5 MG tablet Take 5 mg by mouth daily.   unknown   nitroGLYCERIN (NITROSTAT) 0.4 MG SL tablet Place 1 tablet (0.4 mg total) under the tongue every 5 (five) minutes as needed for chest pain. 25 tablet 6 Past Week   potassium chloride SA (KLOR-CON  M) 20 MEQ tablet Take 1 tablet (20 mEq total) by mouth daily. Please keep upcoming appointment for future refills. Thank you.   Past Week   pravastatin (PRAVACHOL) 40 MG tablet Take 1 tablet (40 mg total) by mouth at bedtime. 90 tablet 3 Past Week   Scheduled:   aspirin EC  81 mg Oral Daily   atenolol  25 mg Oral Daily   brimonidine  1 drop Right Eye BID   clopidogrel  75 mg Oral Q breakfast   finasteride  5 mg Oral Daily   pravastatin  40 mg Oral QHS   sodium chloride flush  3 mL Intravenous Q12H    Assessment: 88yo male presented to [facility] c/o syncopal events without chest discomfort, but troponins found to be elevated and rising in pattern c/w NSTEMI; pt was started on heparin and tx'd to [facility] for further cardiac w/u, to continue heparin; pt is on apixaban PTA for h/o DVT.  Heparin was started with bolus of 4200 units followed by infusion at 12 units/kg/hr (842 units/hr); initial PTT was 55 with "heparin  correlation" reported as 0.3; per OSH Epic records, heparin bolus/infusion started 11/12 0028 and labs were drawn at 0525 (would expect bolus to still affect AC labs at this point w/ pt's age and renal function), no confirmation labs reported.  Pertinent labs from OSH: WBC 9.6, Hgb 10.6, Plt 259, trop-I 0.291>0.949>1.230, SCr 1.1.  Heparin resumed post cath. Level came back subtherapeutic today. PTT and HL are correlating.   Addendum  D/w Laverda Page and we will transition him back to apixaban.   Goal of Therapy:  Heparin level: 0.3-0.7 Monitor platelets by anticoagulation protocol: Yes   Plan:  Heparin>>apixaban 5mg  PO BID  Ulyses Southward, PharmD, BCIDP, AAHIVP, CPP Infectious Disease Pharmacist 01/30/2023 1:31 PM

## 2023-01-30 NOTE — Discharge Instructions (Signed)
Information on my medicine - ELIQUIS (apixaban)  This medication education was reviewed with me or my healthcare representative as part of my discharge preparation.    Why was Eliquis prescribed for you? Eliquis was prescribed to treat blood clots that may have been found in the veins of your legs (deep vein thrombosis) or in your lungs (pulmonary embolism) and to reduce the risk of them occurring again.  What do You need to know about Eliquis ? Continue Eliquis 5 mg tablet taken TWICE daily.  Eliquis may be taken with or without food.   Try to take the dose about the same time in the morning and in the evening. If you have difficulty swallowing the tablet whole please discuss with your pharmacist how to take the medication safely.  Take Eliquis exactly as prescribed and DO NOT stop taking Eliquis without talking to the doctor who prescribed the medication.  Stopping may increase your risk of developing a new blood clot.  Refill your prescription before you run out.  After discharge, you should have regular check-up appointments with your healthcare provider that is prescribing your Eliquis.    What do you do if you miss a dose? If a dose of ELIQUIS is not taken at the scheduled time, take it as soon as possible on the same day and twice-daily administration should be resumed. The dose should not be doubled to make up for a missed dose.  Important Safety Information A possible side effect of Eliquis is bleeding. You should call your healthcare provider right away if you experience any of the following: Bleeding from an injury or your nose that does not stop. Unusual colored urine (red or dark brown) or unusual colored stools (red or black). Unusual bruising for unknown reasons. A serious fall or if you hit your head (even if there is no bleeding).  Some medicines may interact with Eliquis and might increase your risk of bleeding or clotting while on Eliquis. To help avoid this,  consult your healthcare provider or pharmacist prior to using any new prescription or non-prescription medications, including herbals, vitamins, non-steroidal anti-inflammatory drugs (NSAIDs) and supplements.  This website has more information on Eliquis (apixaban): http://[email or web address].com/eliquis/home     Follow with Primary MD Kirstie Peri, MD in 7 days   Get CBC, CMP, anemia panel, TSH, 2 view Chest X ray -  checked next visit with your primary MD    Activity: As tolerated with Full fall precautions use walker/cane & assistance as needed  Disposition Home    Diet: Heart Healthy    Special Instructions: If you have smoked or chewed Tobacco  in the last 2 yrs please stop smoking, stop any regular Alcohol  and or any Recreational drug use.  On your next visit with your primary care physician please Get Medicines reviewed and adjusted.  Please request your Prim.MD to go over all Hospital Tests and Procedure/Radiological results at the follow up, please get all Hospital records sent to your Prim MD by signing hospital release before you go home.  If you experience worsening of your admission symptoms, develop shortness of breath, life threatening emergency, suicidal or homicidal thoughts you must seek medical attention immediately by calling 911 or calling your MD immediately  if symptoms less severe.  You Must read complete instructions/literature along with all the possible adverse reactions/side effects for all the Medicines you take and that have been prescribed to you. Take any new Medicines after you have completely understood and accpet all the

## 2023-01-30 NOTE — TOC Transition Note (Signed)
Transition of Care [facility]) - CM/SW Discharge Note   Patient Details  Name: Harold [name] MRN: [id number] Date of Birth: 1934/07/12  Transition of Care [facility]) CM/SW Contact:  Harriet Masson, RN Phone Number: 01/30/2023, 3:45 PM   Clinical Narrative:    Patient stable for discharge.  Patient lives alone and drives himself, Daughter can transport as needed.  Patient has 3 walkers at home.  Address, Phone number and PCP verified.  No TOC needs at this time. Final next level of care: Home/Self Care Barriers to Discharge: Barriers Resolved   Patient Goals and CMS Choice    Return home  Discharge Placement               home          Discharge Plan and Services Additional resources added to the After Visit Summary for                                       Social Determinants of Health (SDOH) Interventions SDOH Screenings   Food Insecurity: No Food Insecurity (01/28/2023)  Housing: Low Risk  (01/28/2023)  Transportation Needs: No Transportation Needs (01/28/2023)  Utilities: Not At Risk (01/28/2023)  Financial Resource Strain: Low Risk  (11/17/2022)   Received from [facility]  Physical Activity: Inactive (11/17/2022)   Received from [facility]  Social Connections: Moderately Isolated (11/17/2022)   Received from [facility]  Stress: Stress Concern Present (11/17/2022)   Received from [facility]  Tobacco Use: Low Risk  (01/28/2023)  Health Literacy: Low Risk  (11/17/2022)   Received from [facility]     Readmission Risk Interventions    11/19/2022    5:31 PM  Readmission Risk Prevention Plan  Post Dischage Appt Complete  Medication Screening Complete  Transportation Screening Complete

## 2023-02-03 ENCOUNTER — Telehealth (HOSPITAL_COMMUNITY): Payer: Self-pay

## 2023-02-03 NOTE — Telephone Encounter (Signed)
Per phase 1 Cardiac Rehab fax referral to [facility].

## 2023-02-20 ENCOUNTER — Ambulatory Visit (HOSPITAL_COMMUNITY)
Admission: RE | Admit: 2023-02-20 | Discharge: 2023-02-20 | Disposition: A | Discharge status: Home / Self Care | Payer: Medicare Other | Source: Ambulatory Visit | Attending: Hematology | Admitting: Hematology

## 2023-02-20 DIAGNOSIS — C8208 Follicular lymphoma grade I, lymph nodes of multiple sites: Secondary | ICD-10-CM | POA: Diagnosis present

## 2023-02-20 MED ORDER — IOHEXOL 300 MG/ML  SOLN
30.0000 mL | Freq: Once | INTRAMUSCULAR | Status: AC | PRN
  Administered 2023-02-20: 30 mL via ORAL

## 2023-02-20 MED ORDER — IOHEXOL 300 MG/ML  SOLN
100.0000 mL | Freq: Once | INTRAMUSCULAR | Status: AC | PRN
  Administered 2023-02-20: 100 mL via INTRAVENOUS

## 2023-02-24 ENCOUNTER — Telehealth: Payer: Self-pay | Admitting: Radiation Oncology

## 2023-02-24 ENCOUNTER — Encounter: Payer: Self-pay | Admitting: *Deleted

## 2023-02-24 ENCOUNTER — Encounter: Payer: Self-pay | Admitting: Cardiovascular Disease

## 2023-02-24 ENCOUNTER — Ambulatory Visit: Payer: Medicare Other | Attending: Cardiovascular Disease | Admitting: Cardiovascular Disease

## 2023-02-24 VITALS — BP 128/60 | HR 72 | Ht 70.0 in | Wt 150.0 lb

## 2023-02-24 DIAGNOSIS — E785 Hyperlipidemia, unspecified: Secondary | ICD-10-CM | POA: Diagnosis not present

## 2023-02-24 DIAGNOSIS — I2510 Atherosclerotic heart disease of native coronary artery without angina pectoris: Secondary | ICD-10-CM

## 2023-02-24 DIAGNOSIS — I5032 Chronic diastolic (congestive) heart failure: Secondary | ICD-10-CM

## 2023-02-24 DIAGNOSIS — I10 Essential (primary) hypertension: Secondary | ICD-10-CM | POA: Diagnosis not present

## 2023-02-24 NOTE — Progress Notes (Signed)
Chief Complaint  Patient presents with   Follow-up    CAD   History of Present Illness: 88 yo male with history of CAD, HTN, follicular lymphoma and HLD here today for cardiac follow up. He underwent catheterization in October 2011 because of chest pain and was found to have a severe stenosis in a large posterolateral branch of the right coronary artery and a severe stenosis in the proximal LAD with complete occlusion of the distal LAD. Drug eluting stents were placed in the posterior lateral branch of the right coronary and the mid LAD in October 2011. He was diagnosed with lymphoma in March 2019. This was found when he had a mass on his neck. This was treated with resection. He completed chemotherapy and radiation therapy. Echo January 2020 with LVEF=60-65%. No significant valve disease. He was seen in my office February 2022 and c/o bilateral leg swelling with left worse than the right. Venous dopplers February 2022 with peroneal vein thrombosis. He was started on Eliquis and completed six months of therapy. He was admitted to [facility] in February 2023 with chest pain, mild troponin elevation. Outpatient stress test 04/27/21 with no large areas of ischemia. He was admitted to [facility] in September 2024 with septic shock from cellulitis. Echo September 2024 with LVEF=60-65%. Mild MR. Mild mitral stenosis. 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 He was admitted to [facility] 01/27/23 after a syncopal episode and was found to have a NSTEMI. Echo 01/29/23 with normal LV systolic function, moderate MR, mild AS. Cardiac cath severe, heavily calcified mid LAD stenosis just before the old stent, chronic distal LAD occlusion. Patent Circumflex and RCA with non-obstructive disease. I performed intracoronary lithotripsy and angioplasty of the mid LAD lesion. No new stent placed due to inability to fully expand the lesion. Orbital atherectomy not felt to be a good option due to presence of  the prior stent near the lesion. Cardiac monitor November 2024 with sinus, SVT, rare PACs, rare PVCs.   He is here today for follow up. The patient denies any chest pain, dyspnea, palpitations, lower extremity edema, orthopnea, PND, dizziness, near syncope or syncope. He does not feel well overall. He has weakness. He has had a few nosebleeds on Eliquis and Brilinta. Hgb 11.1 on 02/05/23 in primary care.     Primary Care Physician: Barron Alvine, MD  Past Medical History:  Diagnosis Date   BPH (benign prostatic hypertrophy)    CAD (coronary artery disease) 01/2010   S/P drug eluting stents LAD and PL branch of RCA Dr. 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   follicular lymphoma dx'd 06/2017   Hernia    HLD (hyperlipidemia)    HTN (hypertension)     Past Surgical History:  Procedure Laterality Date   BACK SURGERY     CORONARY ANGIOGRAPHY N/A 01/29/2023   Procedure: CORONARY ANGIOGRAPHY;  Surgeon: Kathleene Hazel, MD;  Location: [facility];  Service: Cardiovascular;  Laterality: N/A;   CORONARY BALLOON ANGIOPLASTY N/A 01/29/2023   Procedure: CORONARY BALLOON ANGIOPLASTY;  Surgeon: Kathleene Hazel, MD;  Location: [facility] INVASIVE CV LAB;  Service: Cardiovascular;  Laterality: N/A;   CORONARY LITHOTRIPSY N/A 01/29/2023   Procedure: CORONARY LITHOTRIPSY;  Surgeon: Kathleene Hazel, MD;  Location: [facility] CV LAB;  Service: Cardiovascular;  Laterality: N/A;   CYSTOSCOPY WITH LITHOLAPAXY N/A 05/20/2019   Procedure: CYSTOSCOPY WITH LITHOLAPAXY;  Surgeon: Crist Fat, MD;  Location: [facility] OR[facility];  Service: Urology;  Laterality: N/A;   CYSTOSCOPY/URETEROSCOPY/HOLMIUM LASER/STENT PLACEMENT Left 05/20/2019   Procedure: CYSTOSCOPY/URETEROSCOPY/HOLMIUM LASER/STENT PLACEMENT;  Surgeon: Crist Fat, MD;  Location: [facility];  Service: Urology;  Laterality: Left;   HERNIA REPAIR  01/08/11   RIH   MASS EXCISION Right  08/05/2017   Procedure: EXCISION RIGHT POSTERIOR  NECK LYMPH NODE;  Surgeon: Drema Halon, MD;  Location: [facility];  Service: ENT;  Laterality: Right;    Current Outpatient Medications  Medication Sig Dispense Refill   apixaban (ELIQUIS) 5 MG TABS tablet Take 2.5 mg by mouth 2 (two) times daily.     atenolol (TENORMIN) 25 MG tablet Take 1 tablet (25 mg total) by mouth daily. 90 tablet 3   brimonidine (ALPHAGAN) 0.2 % ophthalmic solution Place 1 drop into the right eye 2 (two) times daily.     cholecalciferol (VITAMIN D) 1000 UNITS tablet Take 1,000 Units by mouth daily.     Cyanocobalamin (B-12) 2500 MCG TABS Take 2,500 mcg by mouth daily.     finasteride (PROSCAR) 5 MG tablet Take 5 mg by mouth daily.     furosemide (LASIX) 20 MG tablet TAKE 1 TABLET BY MOUTH TWICE  DAILY 180 tablet 3   gabapentin (NEURONTIN) 100 MG capsule Take 1 capsule by mouth 2 (two) times daily as needed (for knee pain).     potassium chloride SA (KLOR-CON M) 20 MEQ tablet Take 1 tablet (20 mEq total) by mouth daily. Please keep upcoming appointment for future refills. Thank you.     pravastatin (PRAVACHOL) 40 MG tablet Take 1 tablet (40 mg total) by mouth at bedtime. 90 tablet 3   sucralfate (CARAFATE) 1 g tablet Take 1 g by mouth 4 (four) times daily -  with meals and at bedtime.     ticagrelor (BRILINTA) 90 MG TABS tablet Take 1 tablet (90 mg total) by mouth 2 (two) times daily. 180 tablet 1   nitroGLYCERIN (NITROSTAT) 0.4 MG SL tablet Place 1 tablet (0.4 mg total) under the tongue every 5 (five) minutes as needed for chest pain. 25 tablet 6   No current facility-administered medications for this visit.    No Known Allergies  Social History   Socioeconomic History   Marital status: Married    Spouse name: Not on file   Number of children: Not on file   Years of education: Not on file   Highest education level: Not on file  Occupational History   Not on file  Tobacco Use   Smoking  status: Never   Smokeless tobacco: Never  Vaping Use   Vaping status: Never Used  Substance and Sexual Activity   Alcohol use: No   Drug use: No   Sexual activity: Not on file  Other Topics Concern   Not on file  Social History Narrative   Married   Social Determinants of Health   Financial Resource Strain: Low Risk  (11/17/2022)   Received from [facility]   Overall Financial Resource Strain (CARDIA)    Difficulty of Paying Living Expenses: Not very hard  Food Insecurity: No Food Insecurity (01/28/2023)   Hunger Vital Sign    Worried About Running Out of Food in the Last Year: Never true    Ran Out of Food in the Last Year: Never true  Transportation Needs: No Transportation Needs (01/28/2023)   PRAPARE - Administrator, Civil Service (Medical): No  Lack of Transportation (Non-Medical): No  Physical Activity: Inactive (11/17/2022)   Received from [facility]   Exercise Vital Sign    Days of Exercise per Week: 0 days    Minutes of Exercise per Session: 0 min  Stress: Stress Concern Present (11/17/2022)   Received from [facility] Occupational [facility] - Occupational Stress Questionnaire    Feeling of Stress : Rather much  Social Connections: Moderately Isolated (11/17/2022)   Received from [facility]   Social Connection and Isolation Panel [NHANES]    Frequency of Communication with Friends and Family: More than three times a week    Frequency of Social Gatherings with Friends and Family: More than three times a week    Attends Religious Services: More than 4 times per year    Active Member of [employer] or Organizations: No    Attends Banker Meetings: Never    Marital Status: Widowed  Intimate Partner Violence: Not At Risk (01/28/2023)   Humiliation, Afraid, Rape, and Kick questionnaire    Fear of Current or Ex-Partner: No    Emotionally Abused: No    Physically Abused: No    Sexually Abused: No    Family  History  Problem Relation Age of Onset   Heart failure Mother     Review of Systems:  As stated in the HPI and otherwise negative.   BP 128/60   Pulse 72   Ht 5\' 10"  (1.778 m)   Wt 68 kg   SpO2 99%   BMI 21.52 kg/m   Physical Examination:  General: Well developed, well nourished, NAD  HEENT: OP clear, mucus membranes moist  SKIN: warm, dry. No rashes. Neuro: No focal deficits  Musculoskeletal: Muscle strength 5/5 all ext  Psychiatric: Mood and affect normal  Neck: No JVD, no carotid bruits, no thyromegaly, no lymphadenopathy.  Lungs:Clear bilaterally, no wheezes, rhonci, crackles Cardiovascular: Regular rate and rhythm. No murmurs, gallops or rubs. Abdomen:Soft. Bowel sounds present. Non-tender.  Extremities: No lower extremity edema. Pulses are 2 + in the bilateral DP/PT.  EKG:  EKG is not ordered today. The ekg ordered today demonstrates   Recent Labs: 01/06/2023: ALT 6 01/28/2023: B Natriuretic Peptide 482.6; Magnesium 1.8 01/30/2023: BUN 16; Creatinine, Ser 1.00; Hemoglobin 9.5; Platelets 207; Potassium 3.5; Sodium 139   Lipid Panel    Component Value Date/Time   CHOL 129 12/26/2021 0929   TRIG 52 12/26/2021 0929   HDL 78 12/26/2021 0929   CHOLHDL 1.7 12/26/2021 0929   LDLCALC 39 12/26/2021 0929     Wt Readings from Last 3 Encounters:  02/24/23 68 kg  01/28/23 68.8 kg  01/06/23 72.3 kg    Assessment and Plan:   1. CAD without angina: No chest pain following recent PCI. Echo November 2024 with normal LV function. Will continue statin and atenolol. He is now off of Imdur. Of note, he was a non-responder to Plavix. He will stop Brilinta after one month of therapy (given recent nose bleeds and use of Eliquis) and resume ASA 81 mg daily he stops the Brilinta.   2. HTN: BP is well controlled. No changes.   3. Hyperlipidemia: LDL 37 in November 2024. Continue statin.   4. Chronic diastolic CHF: Weight is stable. Continue Lasix.   5. Right leg swelling: His  right leg has been swollen for months due to his adenopathy. Venous dopplers per primary care in October 2024 inconclusive for DVT so he was started on  Eliquis. He is now on Eliquis 2.5 mg po BID. Follow up with primary care next week to re-image his leg.   Labs/ tests ordered today include:  No orders of the defined types were placed in this encounter.  Disposition:   F/U with me in 12 months.   Signed, Verne Carrow, MD 02/24/2023 3:59 PM    [facility] [address] [place], [place], [place]  [address] Phone: [phone]; Fax: [phone]

## 2023-02-24 NOTE — Addendum Note (Signed)
Encounter addended by: Andee Lineman A on: 02/24/2023 4:55 PM  Actions taken: Imaging Exam ended

## 2023-02-24 NOTE — Patient Instructions (Signed)
Medication Instructions:  No changes *If you need a refill on your cardiac medications before your next appointment, please call your pharmacy*   Lab Work: none   Testing/Procedures: none   Follow-Up: At [facility], you and your health needs are our priority.  As part of our continuing mission to provide you with exceptional heart care, we have created designated Provider Care Teams.  These Care Teams include your primary Cardiologist (physician) and Advanced Practice Providers (APPs -  Physician Assistants and Nurse Practitioners) who all work together to provide you with the care you need, when you need it.  We recommend signing up for the patient portal called "MyChart".  Sign up information is provided on this After Visit Summary.  MyChart is used to connect with patients for Virtual Visits (Telemedicine).  Patients are able to view lab/test results, encounter notes, upcoming appointments, etc.  Non-urgent messages can be sent to your provider as well.   To learn more about what you can do with MyChart, go to [email or web address].    Your next appointment:   12 month(s)  Provider:   Christopher McAlhany, MD      

## 2023-02-24 NOTE — Telephone Encounter (Signed)
12/9 @ 9:20 am Patient's daughter called to make sure when they call patient for his post treatment call to call his home number [phone], instead of her number due to they do not stay in same house.

## 2023-02-24 NOTE — Progress Notes (Signed)
Patient wore first 14 day ZIO AT monitor 13 days and 17 hours.  [employer] shipped a 2nd 14 day monitor to the patients home, but he did not apply it.   Patient brought 2nd monitor with him to his appointment with Dr. Clifton Ardean today. It was determined the data from the first 14 day monitor was sufficient.   The second ZIO AT patch and gateway will be mailed back to [facility].  There is no charge for the second device since this is a MCOT which is any Mobile Cardiac Outpatient Telemetry monitor up to 30 days.

## 2023-03-03 ENCOUNTER — Ambulatory Visit
Admission: RE | Admit: 2023-03-03 | Discharge: 2023-03-03 | Disposition: A | Discharge status: Home / Self Care | Payer: Medicare Other | Source: Ambulatory Visit | Attending: Radiation Oncology | Admitting: Radiation Oncology

## 2023-03-03 DIAGNOSIS — Z510 Encounter for antineoplastic radiation therapy: Secondary | ICD-10-CM | POA: Insufficient documentation

## 2023-03-03 DIAGNOSIS — C8201 Follicular lymphoma grade I, lymph nodes of head, face, and neck: Secondary | ICD-10-CM | POA: Insufficient documentation

## 2023-03-03 NOTE — Progress Notes (Signed)
GENERAL- Post-Treat Radiation call for a diagnosis of: Progressive low-grade follicular lymphoma   Patient identity verified x2   Location-  Groin, Right   They completed their radiation on: 12/06/2022  Does the patient complain of any of the following: Patient was NOT AVAILABLE for today's call. Voicemail left.  Chest pains/ SOB:  Headaches/ Dizziness:  Post radiation skin issues  Joint pain/ Swelling:  Motor function issues:  Range of motion limitations:  Urinary/bowel function: Fatigue post radiation:  Appetite good/fair/poor:   Additional comments if applicable: N/A   Patient will continue follow up with Dr. Candise Che for ongoing surveillance.   Harold Favors, LPN

## 2023-03-04 ENCOUNTER — Other Ambulatory Visit: Payer: Self-pay

## 2023-03-04 DIAGNOSIS — C8208 Follicular lymphoma grade I, lymph nodes of multiple sites: Secondary | ICD-10-CM

## 2023-03-05 ENCOUNTER — Inpatient Hospital Stay (HOSPITAL_BASED_OUTPATIENT_CLINIC_OR_DEPARTMENT_OTHER): Payer: Medicare Other | Admitting: Hematology

## 2023-03-05 ENCOUNTER — Inpatient Hospital Stay: Payer: Medicare Other | Attending: Hematology

## 2023-03-05 VITALS — BP 132/59 | HR 68 | Temp 98.1°F | Resp 18 | Wt 155.2 lb

## 2023-03-05 DIAGNOSIS — C8201 Follicular lymphoma grade I, lymph nodes of head, face, and neck: Secondary | ICD-10-CM | POA: Diagnosis present

## 2023-03-05 DIAGNOSIS — C8208 Follicular lymphoma grade I, lymph nodes of multiple sites: Secondary | ICD-10-CM | POA: Diagnosis not present

## 2023-03-05 LAB — CMP (CANCER CENTER ONLY)
ALT: 8 U/L (ref 0–44)
AST: 12 U/L — ABNORMAL LOW (ref 15–41)
Albumin: 4 g/dL (ref 3.5–5.0)
Alkaline Phosphatase: 77 U/L (ref 38–126)
Anion gap: 5 (ref 5–15)
BUN: 20 mg/dL (ref 8–23)
CO2: 33 mmol/L — ABNORMAL HIGH (ref 22–32)
Calcium: 9.6 mg/dL (ref 8.9–10.3)
Chloride: 105 mmol/L (ref 98–111)
Creatinine: 0.87 mg/dL (ref 0.61–1.24)
GFR, Estimated: >60 mL/min (ref >60)
Glucose, Bld: 97 mg/dL (ref 70–99)
Potassium: 4 mmol/L (ref 3.5–5.1)
Sodium: 143 mmol/L (ref 135–145)
Total Bilirubin: 0.6 mg/dL (ref <1.2)
Total Protein: 6.4 g/dL — ABNORMAL LOW (ref 6.5–8.1)

## 2023-03-05 LAB — CBC WITH DIFFERENTIAL (CANCER CENTER ONLY)
Abs Immature Granulocytes: 0.02 10*3/uL (ref 0.00–0.07)
Basophils Absolute: 0 10*3/uL (ref 0.0–0.1)
Basophils Relative: 0 %
Eosinophils Absolute: 0 10*3/uL (ref 0.0–0.5)
Eosinophils Relative: 1 %
HCT: 34.9 % — ABNORMAL LOW (ref 39.0–52.0)
Hemoglobin: 10.8 g/dL — ABNORMAL LOW (ref 13.0–17.0)
Immature Granulocytes: 0 %
Lymphocytes Relative: 14 %
Lymphs Abs: 1 10*3/uL (ref 0.7–4.0)
MCH: 26.3 pg (ref 26.0–34.0)
MCHC: 30.9 g/dL (ref 30.0–36.0)
MCV: 85.1 fL (ref 80.0–100.0)
Monocytes Absolute: 0.8 10*3/uL (ref 0.1–1.0)
Monocytes Relative: 10 %
Neutro Abs: 5.4 10*3/uL (ref 1.7–7.7)
Neutrophils Relative %: 75 %
Platelet Count: 240 10*3/uL (ref 150–400)
RBC: 4.1 MIL/uL — ABNORMAL LOW (ref 4.22–5.81)
RDW: 14.3 % (ref 11.5–15.5)
WBC Count: 7.2 10*3/uL (ref 4.0–10.5)
nRBC: 0 % (ref 0.0–0.2)

## 2023-03-05 LAB — LACTATE DEHYDROGENASE: LDH: 144 U/L (ref 98–192)

## 2023-03-05 NOTE — Progress Notes (Signed)
HEMATOLOGY/ONCOLOGY CLINIC NOTE  Date of Service: 03/05/2023  Patient Care Team: Barron Alvine, MD as PCP - General (Family Medicine) Kathleene Hazel, MD as PCP - Cardiology (Cardiology)  CHIEF COMPLAINTS/PURPOSE OF CONSULTATION:  Follow-up for continued evaluation and management of low-grade follicular lymphoma   HISTORY OF PRESENTING ILLNESS:  please see previous notes for details on initial presentation.  INTERVAL HISTORY:   Harold [name] is here for continued evaluation and management of his low-grade follicular lymphoma.   Patient was last seen by me on 01/06/2023 and reported mild leg swelling, chills, neck pain attributed to arthritis, and small bump in right neck. He also noted having cellulitis with fever on 12/13/2022, but did not have fever during the visit.   Today, he is accompanied by his wife. Patient notes that he is overall feeling the best that he has felt over the last 5 years.   He reports that his leg swelling has improved. Patient reports that radiation therapy improved his leg and feet symptoms. Patient denies any skin changes or burning from radiation therapy.   He reports that he continues to have knots on his right face and right and left neck, which are stable in size.   Patient denies having any recent dental issues this year.   He reports findings of precancer on his ears, which resembled a pimple in appearance with blood inside. He reports that the lesion bursted and he endorsed plenty of bleeding.   His wife reports that patient will see his PCP next in 1 month.  MEDICAL HISTORY:  Past Medical History:  Diagnosis Date   BPH (benign prostatic hypertrophy)    CAD (coronary artery disease) 01/2010   S/P drug eluting stents LAD and [facility] branch of [facility] Dr. 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   follicular lymphoma dx'd 06/2017   Hernia    HLD (hyperlipidemia)    HTN (hypertension)      SURGICAL HISTORY: Past Surgical History:  Procedure Laterality Date   BACK SURGERY     CORONARY ANGIOGRAPHY N/A 01/29/2023   Procedure: CORONARY ANGIOGRAPHY;  Surgeon: Kathleene Hazel, MD;  Location: [facility];  Service: Cardiovascular;  Laterality: N/A;   CORONARY BALLOON ANGIOPLASTY N/A 01/29/2023   Procedure: CORONARY BALLOON ANGIOPLASTY;  Surgeon: Kathleene Hazel, MD;  Location: [facility];  Service: Cardiovascular;  Laterality: N/A;   CORONARY LITHOTRIPSY N/A 01/29/2023   Procedure: CORONARY LITHOTRIPSY;  Surgeon: Kathleene Hazel, MD;  Location: [facility];  Service: Cardiovascular;  Laterality: N/A;   CYSTOSCOPY WITH LITHOLAPAXY N/A 05/20/2019   Procedure: CYSTOSCOPY WITH LITHOLAPAXY;  Surgeon: Crist Fat, MD;  Location: [facility];  Service: Urology;  Laterality: N/A;   CYSTOSCOPY/URETEROSCOPY/HOLMIUM LASER/STENT PLACEMENT Left 05/20/2019   Procedure: CYSTOSCOPY/URETEROSCOPY/HOLMIUM LASER/STENT PLACEMENT;  Surgeon: Crist Fat, MD;  Location: [facility];  Service: Urology;  Laterality: Left;   HERNIA REPAIR  01/08/11   RIH   MASS EXCISION Right 08/05/2017   Procedure: EXCISION RIGHT POSTERIOR  NECK LYMPH NODE;  Surgeon: Drema Halon, MD;  Location: [facility];  Service: ENT;  Laterality: Right;    SOCIAL HISTORY: Social History   Socioeconomic History   Marital status: Married    Spouse name: Not on file   Number of children: Not on file   Years of education: Not on file   Highest education level: Not on file  Occupational History  Not on file  Tobacco Use   Smoking status: Never   Smokeless tobacco: Never  Vaping Use   Vaping status: Never Used  Substance and Sexual Activity   Alcohol use: No   Drug use: No   Sexual activity: Not on file  Other Topics Concern   Not on file  Social History Narrative   Married   Social Drivers of Health   Financial Resource Strain: Low Risk   (11/17/2022)   Received from [facility]   Overall Financial Resource Strain (CARDIA)    Difficulty of Paying Living Expenses: Not very hard  Food Insecurity: No Food Insecurity (01/28/2023)   Hunger Vital Sign    Worried About Running Out of Food in the Last Year: Never true    Ran Out of Food in the Last Year: Never true  Transportation Needs: No Transportation Needs (01/28/2023)   PRAPARE - Administrator, Civil Service (Medical): No    Lack of Transportation (Non-Medical): No  Physical Activity: Inactive (11/17/2022)   [facility]   Exercise Vital Sign    Days of Exercise per Week: 0 days    Minutes of Exercise per Session: 0 min  Stress: Stress Concern Present (11/17/2022)   Received from [facility] - Occupational Stress Questionnaire    Feeling of Stress : Rather much  Social Connections: Moderately Isolated (11/17/2022)   Received from [facility]   Social Connection and Isolation Panel [NHANES]    Frequency of Communication with Friends and Family: More than three times a week    Frequency of Social Gatherings with Friends and Family: More than three times a week    Attends Religious Services: More than 4 times per year    Active Member of [employer] or Organizations: No    Attends Banker Meetings: Never    Marital Status: Widowed  Intimate Partner Violence: Not At Risk (01/28/2023)   Humiliation, Afraid, Rape, and Kick questionnaire    Fear of Current or Ex-Partner: No    Emotionally Abused: No    Physically Abused: No    Sexually Abused: No    FAMILY HISTORY: Family History  Problem Relation Age of Onset   Heart failure Mother     ALLERGIES:  has no known allergies.  MEDICATIONS:  Current Outpatient Medications  Medication Sig Dispense Refill   apixaban (ELIQUIS) 5 MG TABS tablet Take 2.5 mg by mouth 2 (two) times daily.     atenolol (TENORMIN) 25 MG tablet Take 1 tablet  (25 mg total) by mouth daily. 90 tablet 3   brimonidine (ALPHAGAN) 0.2 % ophthalmic solution Place 1 drop into the right eye 2 (two) times daily.     cholecalciferol (VITAMIN D) 1000 UNITS tablet Take 1,000 Units by mouth daily.     Cyanocobalamin (B-12) 2500 MCG TABS Take 2,500 mcg by mouth daily.     finasteride (PROSCAR) 5 MG tablet Take 5 mg by mouth daily.     furosemide (LASIX) 20 MG tablet TAKE 1 TABLET BY MOUTH TWICE  DAILY 180 tablet 3   gabapentin (NEURONTIN) 100 MG capsule Take 1 capsule by mouth 2 (two) times daily as needed (for knee pain).     nitroGLYCERIN (NITROSTAT) 0.4 MG SL tablet Place 1 tablet (0.4 mg total) under the tongue every 5 (five) minutes as needed for chest pain. 25 tablet 6   potassium chloride SA (KLOR-CON M) 20 MEQ  tablet Take 1 tablet (20 mEq total) by mouth daily. Please keep upcoming appointment for future refills. Thank you.     pravastatin (PRAVACHOL) 40 MG tablet Take 1 tablet (40 mg total) by mouth at bedtime. 90 tablet 3   sucralfate (CARAFATE) 1 g tablet Take 1 g by mouth 4 (four) times daily -  with meals and at bedtime.     ticagrelor (BRILINTA) 90 MG TABS tablet Take 1 tablet (90 mg total) by mouth 2 (two) times daily. 180 tablet 1   No current facility-administered medications for this visit.    REVIEW OF SYSTEMS:    10 Point review of Systems was done is negative except as noted above.   PHYSICAL EXAMINATION: ECOG FS:2 - Symptomatic, <50% confined to bed  Vitals:   03/05/23 0908  BP: (!) 132/59  Pulse: 68  Resp: 18  Temp: 98.1 F (36.7 C)  SpO2: 100%    Wt Readings from Last 3 Encounters:  02/24/23 150 lb (68 kg)  01/28/23 151 lb 11.2 oz (68.8 kg)  01/06/23 159 lb 6 oz (72.3 kg)   Body mass index is 22.27 kg/m.     GENERAL:alert, in no acute distress and comfortable SKIN: no acute rashes, no significant lesions EYES: conjunctiva are pink and non-injected, sclera anicteric OROPHARYNX: MMM, no exudates, no oropharyngeal  erythema or ulceration NECK: supple, no JVD LYMPH:  no palpable lymphadenopathy in the cervical, axillary or inguinal regions LUNGS: clear to auscultation b/l with normal respiratory effort HEART: regular rate & rhythm ABDOMEN:  normoactive bowel sounds , non tender, not distended. Extremity: no pedal edema PSYCH: alert & oriented x 3 with fluent speech NEURO: no focal motor/sensory deficits   LABORATORY DATA:  I have reviewed the data as listed     Latest Ref Rng & Units 03/05/2023    8:23 AM 01/30/2023    2:39 AM 01/28/2023   11:26 PM  CBC  WBC 4.0 - 10.5 K/uL 7.2  6.1  5.9   Hemoglobin 13.0 - 17.0 g/dL 29.5  9.5  62.1   Hematocrit 39.0 - 52.0 % 34.9  30.2  31.5   Platelets 150 - 400 K/uL 240  207  234        Latest Ref Rng & Units 03/05/2023    8:23 AM 01/30/2023    2:39 AM 01/28/2023   11:26 PM  CMP  Glucose 70 - 99 mg/dL 97  99  308   BUN 8 - 23 mg/dL 20  16  20    Creatinine 0.61 - 1.24 mg/dL 6.57  8.46  9.62   Sodium 135 - 145 mmol/L 143  139  143   Potassium 3.5 - 5.1 mmol/L 4.0  3.5  3.3   Chloride 98 - 111 mmol/L 105  108  109   CO2 22 - 32 mmol/L 33  25  29   Calcium 8.9 - 10.3 mg/dL 9.6  8.3  8.6   Total Protein 6.5 - 8.1 g/dL 6.4     Total Bilirubin <1.2 mg/dL 0.6     Alkaline Phos 38 - 126 U/L 77     AST 15 - 41 U/L 12     ALT 0 - 44 U/L 8      Lab Results  Component Value Date   LDH 144 03/05/2023     02/18/2019 CT Abdomen Pelvis W Contrast (Accession [id number]):   02/18/2019 CT Chest W Contrast (Accession [id number]):  08/08/17 Pathology:   08/05/17 Pathology:  RADIOGRAPHIC STUDIES: I have personally reviewed the radiological images as listed and agreed with the findings in the report. LONG TERM MONITOR-LIVE TELEMETRY (3-14 DAYS) Result Date: 02/25/2023 Patch Wear Time:  13 days and 17 hours (2024-11-14T14:04:19-0500 to 2024-11-28T07:13:05-498) Sinus rhythm. (min HR of 50 bpm, max HR of 171 bpm, and avg HR of 75 bpm). 23  Supraventricular Tachycardia runs occurred, the longest run lasted 11.3 seconds. Isolated SVEs were rare (<1.0%), SVE Couplets were rare (<1.0%), and SVE Triplets were rare (<1.0%). Isolated VEs were rare (<1.0%), VE Couplets were rare (<1.0%), and no VE Triplets were present. Ventricular Bigeminy was present.   CT CHEST ABDOMEN PELVIS W CONTRAST Result Date: 02/20/2023 CLINICAL DATA:  History of follicular lymphoma status post radiation therapy, assess treatment response. * Tracking Code: BO * EXAM: CT CHEST, ABDOMEN, AND PELVIS WITH CONTRAST TECHNIQUE: Multidetector CT imaging of the chest, abdomen and pelvis was performed following the standard protocol during bolus administration of intravenous contrast. RADIATION DOSE REDUCTION: This exam was performed according to the departmental dose-optimization program which includes automated exposure control, adjustment of the mA and/or kV according to patient size and/or use of iterative reconstruction technique. CONTRAST:  OMNIPAQUE IOHEXOL 300 MG/ML  SOLN COMPARISON:  Priors including most recent CT November 19, 2022 FINDINGS: CT CHEST FINDINGS Cardiovascular: Aortic atherosclerosis. No central pulmonary embolus on this nondedicated study. Normal size heart. No significant pericardial effusion/thickening. Dense three-vessel coronary artery calcifications. Calcifications of the mitral annulus and aortic valve. Mediastinum/Nodes: No suspicious thyroid nodule. The esophagus is grossly unremarkable. Slight interval increase in size of mediastinal lymph nodes for instance a subcarinal lymph node measuring 2 cm in short axis on image 35/2 previously measured 17 mm in short axis. Lungs/Pleura: No suspicious pulmonary nodules or masses. 4 mm right lower lobe pulmonary nodule seen on prior examination is no longer identified. Scattered atelectasis/scarring. Biapical pleuroparenchymal scarring. No pleural effusion. No pneumothorax. Musculoskeletal: No aggressive lytic  or blastic lesion of bone. Multilevel degenerative change of the spine. CT ABDOMEN PELVIS FINDINGS Hepatobiliary: No suspicious hepatic lesion. Gallbladder is unremarkable. No biliary ductal dilation. Pancreas: Extensive dystrophic pancreatic calcifications. No pancreatic ductal dilation. Spleen: No splenomegaly. Adrenals/Urinary Tract: Bilateral adrenal glands appear normal. No hydronephrosis. Kidneys demonstrate symmetric enhancement. Mild symmetric wall thickening of a minimally distended urinary bladder urinary bladder. Stomach/Bowel: Radiopaque enteric contrast material traverses the rectum. Stomach is nondistended limiting evaluation. No pathologic dilation of large or small bowel. Mild focal wall thickening of the ascending colon for instance on image 89/2 may reflect underdistention. Vascular/Lymphatic: Normal caliber abdominal aorta. Smooth IVC contours. The portal, splenic and superior mesenteric veins are patent. Slight interval increase in size of the retroperitoneal lymph nodes. For reference: -aortocaval lymph node measures 12 mm in short axis on image 65/2 previously 10 mm Decreased of the inguinal lymph nodes and right inguinal canal soft tissue for instance left inguinal lymph node now measures 6 mm in short axis on image 121/2 previously 2 cm and the soft tissue along the right inguinal canal now measures 3.2 x 2.4 cm on image 113/2 previously 6.4 x 4.3 cm. Inguinal lymph node measures 2 cm in short axis on image 128/2 Reproductive: Dystrophic prostatic calcifications. Other: No significant abdominopelvic free fluid. Musculoskeletal: No aggressive lytic or blastic lesion of bone. Diffuse demineralization of bone. Multilevel degenerative change of the spine. Degenerative change of the hips. IMPRESSION: 1. Slight interval increase in size of mediastinal and retroperitoneal lymph nodes, with decreased size of the inguinal lymph nodes and right  inguinal canal soft tissue. Findings are favored to  reflect a mixed response to therapy. 2. Mild focal wall thickening of the ascending colon may reflect underdistention. Suggest attention on short-term interval follow-up imaging versus more definitive assessment by colonoscopy if clinically indicated. 3. Similar calcifications along the posterior wall of the urinary bladder with mild symmetric wall thickening of a minimally distended urinary bladder, correlate with urinalysis to exclude cystitis. 4. Sequela of chronic pancreatitis. 5. Dense three-vessel coronary artery calcifications. 6.  Aortic Atherosclerosis (ICD10-I70.0). Electronically Signed   By: Maudry Mayhew M.D.   On: 02/20/2023 13:12     ASSESSMENT & PLAN:   88 y.o. 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 No abdominal or pelvic involvement.    03/20/18 CT C/A/P revealed Progressive bilateral axillary and right inguinal lymphadenopathy, including necrotic lymph nodes in the right axilla, as above. Spleen is normal in size. 4 mm distal left ureteral calculus, unchanged. No hydronephrosis. Stable layering bladder calculi measuring up to 5 mm. Cholelithiasis, without associated inflammatory changes. Stable sequela of prior/chronic pancreatitis.   11/03/2018 CT neck revealed "Marked interval progression of cervical lymphadenopathy since PET-CT 09/02/2017, now with lymphadenopathy present bilaterally at essentially all stations. Index nodes as described. This includes bilateral intraparotid lymphadenopathy. Bulky right-sided adenopathy results in multifocal narrowing of the right internal jugular vein. Significant narrowing of the mid to lower left internal jugular vein of unclear cause as there is no  significant mass effect at this level."  11/03/2018 CT CAP revealed "1. Progression of bilateral axillary, retroperitoneal, pelvic, and inguinal adenopathy. New anterior and posterior lower cervical adenopathy identified within the neck. 2. Persistent distal left ureteral calculus. Small bladder calculi are unchanged. 3. Aortic Atherosclerosis (ICD10-I70.0). Coronary artery calcifications. 4. Sequelae of chronic pancreatitis noted."  10/01/2019 CT Abd/Pel ([id number]) revealed no evidence of mass in right kidney, new and enlarged abdominal and pelvic lymph nodes.   2. 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 3. H/o head and neck Basal cell carcinoma Continue routine skin checks with dermatologist    4. Renal indeterminate lesion -06/28/19 Renal US ([id number]) revealed New right-sided mass at the hilum which measures 1.5cm in size.   -resolved on subsequent scans   PLAN:  -Discussed lab results on 03/05/23 in detail with patient. CBC showed WBC of 7.2K, hemoglobin of 10.8, and platelets of 240K. -WBC and platelets normal -patient was previously slightly anemic during the time that he was receiving radiation therapy with hgb 9.5 on 01/30/2023. His hgb has since improved to nearly 11. -CMP showed normal kidney and liver numbers. Calcium levels are not elevated.  -CT chest/abdm/pelvis scan on 02/20/2023 showed that there continue to be a few lymph nodes in the center of the chest, as well as some lymph nodes in the back. The mass in his right groin has shrunk in size to a great degree and is nearly normal.  -radiation therapy has improved patient's symptoms -patient does have a few small lymph nodes in his neck which are not significantly bothersome at this time -recommend patient to continue to use compression socks as much as possible -no indication for treatment for lymphoma at this time given that his low-grade follicular lymphoma is not bothersome at this time.  -will continue to monitor with  repeat labs in 6 months. There may be a role for a scan in 1.5-2 years or if he endorses any new symptoms.  -answered all of patient's and his daughter's questions in detail  FOLLOW-UP: RTC with Dr Candise Che with labs in 6 months  The total time spent in the appointment was 30 minutes* .  All of the patient's questions were answered with apparent satisfaction. The patient knows to call the clinic with any problems, questions or concerns.   Wyvonnia Lora MD MS AAHIVMS [facility] Hematology/Oncology Physician [facility]  .*Total Encounter Time as defined by the Centers for Medicare and Medicaid Services includes, in addition to the face-to-face time of a patient visit (documented in the note above) non-face-to-face time: obtaining and reviewing outside history, ordering and reviewing medications, tests or procedures, care coordination (communications with other health care professionals or caregivers) and documentation in the medical record.    I,Mitra Faeizi,acting as a Neurosurgeon for Wyvonnia Lora, MD.,have documented all relevant documentation on the behalf of Wyvonnia Lora, MD,as directed by  Wyvonnia Lora, MD while in the presence of Wyvonnia Lora, MD.  .I have reviewed the above documentation for accuracy and completeness, and I agree with the above. Johney Maine MD

## 2023-07-29 ENCOUNTER — Telehealth: Payer: Self-pay | Admitting: *Deleted

## 2023-07-29 NOTE — Telephone Encounter (Signed)
 Daughter Harold [name] contacted office and LVM - patient scheduled for appt next month. He wants to speak with nurse about swelling in his jaw. She provided patient's contact # [phone]. Contacted patient at number provided. LVM that returning call from daughter earlier today to discuss his concerns. Informed that he will be contacted tomorrow.

## 2023-07-31 ENCOUNTER — Telehealth: Payer: Self-pay | Admitting: *Deleted

## 2023-08-04 NOTE — Telephone Encounter (Signed)
 Harold [name]

## 2023-08-14 ENCOUNTER — Telehealth: Payer: Self-pay | Admitting: Hematology

## 2023-08-14 NOTE — Telephone Encounter (Signed)
 Rescheduled appointments with "[name]" from Dr. Angela Barban office. She stated the patient is in pain and his jaw is swelling. Moved appointments closer with Delores Fester. She informed the patient of the changes made.

## 2023-08-14 NOTE — Telephone Encounter (Signed)
 Rescheduled appointments with the patients daughter.

## 2023-08-19 ENCOUNTER — Other Ambulatory Visit: Payer: Self-pay

## 2023-08-19 DIAGNOSIS — C8208 Follicular lymphoma grade I, lymph nodes of multiple sites: Secondary | ICD-10-CM

## 2023-08-20 ENCOUNTER — Inpatient Hospital Stay: Admitting: Physician Assistant

## 2023-08-20 ENCOUNTER — Inpatient Hospital Stay: Attending: Physician Assistant

## 2023-08-20 VITALS — BP 148/61 | HR 68 | Temp 97.9°F | Resp 17 | Ht 70.0 in | Wt 164.1 lb

## 2023-08-20 DIAGNOSIS — C8208 Follicular lymphoma grade I, lymph nodes of multiple sites: Secondary | ICD-10-CM

## 2023-08-20 DIAGNOSIS — Z5111 Encounter for antineoplastic chemotherapy: Secondary | ICD-10-CM | POA: Diagnosis present

## 2023-08-20 DIAGNOSIS — C8290 Follicular lymphoma, unspecified, unspecified site: Secondary | ICD-10-CM | POA: Diagnosis present

## 2023-08-20 DIAGNOSIS — Z79899 Other long term (current) drug therapy: Secondary | ICD-10-CM | POA: Insufficient documentation

## 2023-08-20 DIAGNOSIS — R220 Localized swelling, mass and lump, head: Secondary | ICD-10-CM | POA: Diagnosis not present

## 2023-08-20 DIAGNOSIS — N201 Calculus of ureter: Secondary | ICD-10-CM | POA: Insufficient documentation

## 2023-08-20 DIAGNOSIS — N210 Calculus in bladder: Secondary | ICD-10-CM | POA: Diagnosis not present

## 2023-08-20 LAB — CBC WITH DIFFERENTIAL (CANCER CENTER ONLY)
Abs Immature Granulocytes: 0.02 10*3/uL (ref 0.00–0.07)
Basophils Absolute: 0 10*3/uL (ref 0.0–0.1)
Basophils Relative: 0 %
Eosinophils Absolute: 0.1 10*3/uL (ref 0.0–0.5)
Eosinophils Relative: 1 %
HCT: 35 % — ABNORMAL LOW (ref 39.0–52.0)
Hemoglobin: 11.3 g/dL — ABNORMAL LOW (ref 13.0–17.0)
Immature Granulocytes: 0 %
Lymphocytes Relative: 16 %
Lymphs Abs: 1.2 10*3/uL (ref 0.7–4.0)
MCH: 27 pg (ref 26.0–34.0)
MCHC: 32.3 g/dL (ref 30.0–36.0)
MCV: 83.7 fL (ref 80.0–100.0)
Monocytes Absolute: 0.8 10*3/uL (ref 0.1–1.0)
Monocytes Relative: 11 %
Neutro Abs: 5.6 10*3/uL (ref 1.7–7.7)
Neutrophils Relative %: 72 %
Platelet Count: 210 10*3/uL (ref 150–400)
RBC: 4.18 MIL/uL — ABNORMAL LOW (ref 4.22–5.81)
RDW: 15.7 % — ABNORMAL HIGH (ref 11.5–15.5)
WBC Count: 7.7 10*3/uL (ref 4.0–10.5)
nRBC: 0 % (ref 0.0–0.2)

## 2023-08-20 LAB — CMP (CANCER CENTER ONLY)
ALT: 11 U/L (ref 0–44)
AST: 14 U/L — ABNORMAL LOW (ref 15–41)
Albumin: 4.1 g/dL (ref 3.5–5.0)
Alkaline Phosphatase: 63 U/L (ref 38–126)
Anion gap: 6 (ref 5–15)
BUN: 32 mg/dL — ABNORMAL HIGH (ref 8–23)
CO2: 34 mmol/L — ABNORMAL HIGH (ref 22–32)
Calcium: 9.5 mg/dL (ref 8.9–10.3)
Chloride: 103 mmol/L (ref 98–111)
Creatinine: 1.03 mg/dL (ref 0.61–1.24)
GFR, Estimated: >60 mL/min (ref >60)
Glucose, Bld: 98 mg/dL (ref 70–99)
Potassium: 3.9 mmol/L (ref 3.5–5.1)
Sodium: 143 mmol/L (ref 135–145)
Total Bilirubin: 0.7 mg/dL (ref 0.0–1.2)
Total Protein: 6.8 g/dL (ref 6.5–8.1)

## 2023-08-20 LAB — LACTATE DEHYDROGENASE: LDH: 164 U/L (ref 98–192)

## 2023-08-20 NOTE — Progress Notes (Signed)
 HEMATOLOGY/ONCOLOGY CLINIC NOTE  Date of Service: 08/20/2023  Patient Care Team: Carolynne Citron, MD as PCP - General (Family Medicine) Odie Benne, MD as PCP - Cardiology (Cardiology)  CHIEF COMPLAINTS/PURPOSE OF CONSULTATION:  Follow-up for continued evaluation and management of low-grade follicular lymphoma  INTERVAL HISTORY:   Harold [name] is here for continued evaluation and management of his low-grade follicular lymphoma. Harold [name] reports noticing a mass around in right jaw approximately one month ago that has been enlarging quickly. He was seen by his PCP who prescribed in 10 days of steroids which did decrease the size of the mass but started to grow again. The mass is nontender without any signs of infection. He is able to eat without difficulty. He is otherwise stable without changes to his energy or appetite. He denies nausea, vomiting or bowel habit changes. He denies easy bruising or signs of bleeding. He denies fevers, chills, sweats, shortness of breath, chest pain or cough. He has no other complaints.   MEDICAL HISTORY:  Past Medical History:  Diagnosis Date   BPH (benign prostatic hypertrophy)    CAD (coronary artery disease) 01/2010   S/P drug eluting stents LAD and PL branch of [facility] Dr. 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   follicular lymphoma dx'd 06/2017   Hernia    HLD (hyperlipidemia)    HTN (hypertension)     SURGICAL HISTORY: Past Surgical History:  Procedure Laterality Date   BACK SURGERY     CORONARY ANGIOGRAPHY N/A 01/29/2023   Procedure: CORONARY ANGIOGRAPHY;  Surgeon: Odie Benne, MD;  Location: [facility];  Service: Cardiovascular;  Laterality: N/A;   CORONARY BALLOON ANGIOPLASTY N/A 01/29/2023   Procedure: CORONARY BALLOON ANGIOPLASTY;  Surgeon: Odie Benne, MD;  Location: [facility];  Service: Cardiovascular;  Laterality: N/A;   CORONARY  LITHOTRIPSY N/A 01/29/2023   Procedure: CORONARY LITHOTRIPSY;  Surgeon: Odie Benne, MD;  Location: [facility];  Service: Cardiovascular;  Laterality: N/A;   CYSTOSCOPY WITH LITHOLAPAXY N/A 05/20/2019   Procedure: CYSTOSCOPY WITH LITHOLAPAXY;  Surgeon: Andrez Banker, MD;  Location: [facility];  Service: Urology;  Laterality: N/A;   CYSTOSCOPY/URETEROSCOPY/HOLMIUM LASER/STENT PLACEMENT Left 05/20/2019   Procedure: CYSTOSCOPY/URETEROSCOPY/HOLMIUM LASER/STENT PLACEMENT;  Surgeon: Andrez Banker, MD;  Location: [facility];  Service: Urology;  Laterality: Left;   HERNIA REPAIR  01/08/11   RIH   MASS EXCISION Right 08/05/2017   Procedure: EXCISION RIGHT POSTERIOR  NECK LYMPH NODE;  Surgeon: Prescott Brodie, MD;  Location: [facility];  Service: ENT;  Laterality: Right;    SOCIAL HISTORY: Social History   Socioeconomic History   Marital status: Married    Spouse name: Not on file   Number of children: Not on file   Years of education: Not on file   Highest education level: Not on file  Occupational History   Not on file  Tobacco Use   Smoking status: Never   Smokeless tobacco: Never  Vaping Use   Vaping status: Never Used  Substance and Sexual Activity   Alcohol use: No   Drug use: No   Sexual activity: Not on file  Other Topics Concern   Not on file  Social History Narrative   Married   Social Drivers of Health   Financial Resource Strain: Low Risk  (11/17/2022)   Received from [facility]   Overall Financial Resource Strain (  CARDIA)    Difficulty of Paying Living Expenses: Not very hard  Food Insecurity: No Food Insecurity (06/04/2023)   Received from [facility]   Hunger Vital Sign    Worried About Running Out of Food in the Last Year: Never true    Ran Out of Food in the Last Year: Never true  Transportation Needs: No Transportation Needs (06/04/2023)   Received from [facility] - Transportation    Lack of  Transportation (Medical): No    Lack of Transportation (Non-Medical): No  Physical Activity: Inactive (11/17/2022)   Received from [facility]   Exercise Vital Sign    Days of Exercise per Week: 0 days    Minutes of Exercise per Session: 0 min  Stress: Stress Concern Present (11/17/2022)   Received from [facility] - Occupational Stress Questionnaire    Feeling of Stress : Rather much  Social Connections: Moderately Isolated (11/17/2022)   Received from [facility]   Social Connection and Isolation Panel [NHANES]    Frequency of Communication with Friends and Family: More than three times a week    Frequency of Social Gatherings with Friends and Family: More than three times a week    Attends Religious Services: More than 4 times per year    Active Member of [employer] or Organizations: No    Attends Banker Meetings: Never    Marital Status: Widowed  Intimate Partner Violence: Not At Risk (06/04/2023)   Received from [facility]   Humiliation, Afraid, Rape, and Kick questionnaire    Fear of Current or Ex-Partner: No    Emotionally Abused: No    Physically Abused: No    Sexually Abused: No    FAMILY HISTORY: Family History  Problem Relation Age of Onset   Heart failure Mother     ALLERGIES:  has no known allergies.  MEDICATIONS:  Current Outpatient Medications  Medication Sig Dispense Refill   apixaban  (ELIQUIS ) 5 MG TABS tablet Take 2.5 mg by mouth 2 (two) times daily.     atenolol  (TENORMIN ) 25 MG tablet Take 1 tablet (25 mg total) by mouth daily. 90 tablet 3   brimonidine  (ALPHAGAN ) 0.2 % ophthalmic solution Place 1 drop into the right eye 2 (two) times daily.     cholecalciferol (VITAMIN D) 1000 UNITS tablet Take 1,000 Units by mouth daily.     Cyanocobalamin (B-12) 2500 MCG TABS Take 2,500 mcg by mouth daily.     finasteride  (PROSCAR ) 5 MG tablet Take 5 mg by mouth daily.     furosemide  (LASIX ) 20 MG tablet  TAKE 1 TABLET BY MOUTH TWICE  DAILY 180 tablet 3   gabapentin (NEURONTIN) 100 MG capsule Take 1 capsule by mouth 2 (two) times daily as needed (for knee pain).     potassium chloride  SA (KLOR-CON  M) 20 MEQ tablet Take 1 tablet (20 mEq total) by mouth daily. Please keep upcoming appointment for future refills. Thank you.     pravastatin  (PRAVACHOL ) 40 MG tablet Take 1 tablet (40 mg total) by mouth at bedtime. 90 tablet 3   sucralfate (CARAFATE) 1 g tablet Take 1 g by mouth 4 (four) times daily -  with meals and at bedtime.     ticagrelor  (BRILINTA ) 90 MG TABS tablet Take 1 tablet (90 mg total) by mouth 2 (two) times daily. 180 tablet 1   nitroGLYCERIN  (NITROSTAT ) 0.4 MG SL tablet Place 1  tablet (0.4 mg total) under the tongue every 5 (five) minutes as needed for chest pain. 25 tablet 6   No current facility-administered medications for this visit.    REVIEW OF SYSTEMS:    10 Point review of Systems was done is negative except as noted above.   PHYSICAL EXAMINATION: ECOG FS:2 - Symptomatic, <50% confined to bed  Vitals:   08/20/23 1018  BP: (!) 148/61  Pulse: 68  Resp: 17  Temp: 97.9 F (36.6 C)  SpO2: 100%    Wt Readings from Last 3 Encounters:  08/20/23 164 lb 1.6 oz (74.4 kg)  03/05/23 155 lb 3.2 oz (70.4 kg)  02/24/23 150 lb (68 kg)   Body mass index is 23.55 kg/m.     GENERAL:alert, in no acute distress and comfortable SKIN: no acute rashes, no significant lesions EYES: conjunctiva are pink and non-injected, sclera anicteric OROPHARYNX: MMM, no exudates, no oropharyngeal erythema or ulceration NECK: supple, no JVD LYMPH:  Large palpable lymph node in the right submandibular region that is nontender. Small palpable right anterior cervical lymph node. No palpable lymph nodes in the supraclavicular regions, left cervical region, axillary regions.  LUNGS: clear to auscultation b/l with normal respiratory effort HEART: regular rate & rhythm ABDOMEN:  normoactive bowel  sounds , non tender, not distended. No hepatosplenomegaly.  Extremity: no pedal edema PSYCH: alert & oriented x 3 with fluent speech NEURO: no focal motor/sensory deficits   LABORATORY DATA:  I have reviewed the data as listed     Latest Ref Rng & Units 08/20/2023    9:26 AM 03/05/2023    8:23 AM 01/30/2023    2:39 AM  CBC  WBC 4.0 - 10.5 K/uL 7.7  7.2  6.1   Hemoglobin 13.0 - 17.0 g/dL 08.6  57.8  9.5   Hematocrit 39.0 - 52.0 % 35.0  34.9  30.2   Platelets 150 - 400 K/uL 210  240  207        Latest Ref Rng & Units 08/20/2023    9:26 AM 03/05/2023    8:23 AM 01/30/2023    2:39 AM  CMP  Glucose 70 - 99 mg/dL 98  97  99   BUN 8 - 23 mg/dL 32  20  16   Creatinine 0.61 - 1.24 mg/dL 4.69  6.29  5.28   Sodium 135 - 145 mmol/L 143  143  139   Potassium 3.5 - 5.1 mmol/L 3.9  4.0  3.5   Chloride 98 - 111 mmol/L 103  105  108   CO2 22 - 32 mmol/L 34  33  25   Calcium 8.9 - 10.3 mg/dL 9.5  9.6  8.3   Total Protein 6.5 - 8.1 g/dL 6.8  6.4    Total Bilirubin 0.0 - 1.2 mg/dL 0.7  0.6    Alkaline Phos 38 - 126 U/L 63  77    AST 15 - 41 U/L 14  12    ALT 0 - 44 U/L 11  8     Lab Results  Component Value Date   LDH 164 08/20/2023    PATHOLOGY:     RADIOGRAPHIC STUDIES: I have personally reviewed the radiological images as listed and agreed with the findings in the report. No results found.    ASSESSMENT & PLAN:  Harold [name] is a 89 y.o. male who presents to the clinic for a surveillance visit for follicular lymphoma.  89 y.o. male with  1.Stage III Follicular Lymphoma --  08/05/17 pathology results of right cervical LN revealing low grade follicular lymphoma --Received weekly Rituximab  therapy x 4 doses from 11/05/18-11/26/2018, then again from 04/24/2020-05/15/2020.  --He received maintenance rituximab  q 60 days starting 08/07/2020-04/12/2021. --Most recent CT CAP from 02/19/2023 showed slight interval increase in size of mediastinal and retroperitoneal lymph nodes, decreased size  of inguinal lymph nodes and right inguinal canal soft tissue. Findings reflect mixed response.   PLAN: --Labs from today were reviewed and require no intervention. WBC 7.7. Hgb 11.3, Plt 210, Creatinine and LFTs in range --will plan to obtain a STAT biopsy of large right submandibular/neck mass --will obtain PET/CT scan to evaluate for other areas of recurrence.   FOLLOW-UP: Tentative follow up in 2 weeks to review biopsy and PET scan results.   All of the patient's questions were answered with apparent satisfaction. The patient knows to call the clinic with any problems, questions or concerns.   I have spent a total of 30 minutes minutes of face-to-face and non-face-to-face time, preparing to see the patient, obtaining and/or reviewing separately obtained history, performing a medically appropriate examination, counseling and educating the patient, ordering medications/tests/procedures, referring and communicating with other health care professionals, documenting clinical information in the electronic health record, independently interpreting results and communicating results to the patient, and care coordination.   Wyline Hearing PA-C Dept of Hematology and Oncology [facility] Phone: [phone]

## 2023-08-21 NOTE — Progress Notes (Signed)
 Harold Cord, MD  Harold [name] No recent imaging.  Please schedule for US  soft tissue neck and biopsy same day.  We can US  and see if there is in fact a right submandibular mass, and if there is, proceed with core biopsy.  No sedation.  HKM       Previous Messages    ----- Message ----- From: Tinley Rought Sent: 08/20/2023   1:17 PM EDT To: Rosetta Cons, MD; Joelle Musca; * Subject: US  Core Biopsy ( lymph nodes)                  Procedure : US  Core Biopsy ( lymph nodes)  Reason: large right submandibular lymph node,concerning for relapse and possible transformation to aggressive lymphoma Dx: Follicular lymphoma grade i, lymph nodes of multiple sites (HCC) [C82.08 (ICD-10-CM)]    History : CT Chest abd pelv w/ contrast  Provider: Darilyn Edin, PA-C  Provider contact : [phone]

## 2023-08-25 ENCOUNTER — Ambulatory Visit
Admission: RE | Admit: 2023-08-25 | Discharge: 2023-08-25 | Disposition: A | Discharge status: Home / Self Care | Source: Ambulatory Visit | Attending: Physician Assistant | Admitting: Physician Assistant

## 2023-08-25 DIAGNOSIS — I700 Atherosclerosis of aorta: Secondary | ICD-10-CM | POA: Diagnosis not present

## 2023-08-25 DIAGNOSIS — E119 Type 2 diabetes mellitus without complications: Secondary | ICD-10-CM | POA: Insufficient documentation

## 2023-08-25 DIAGNOSIS — R221 Localized swelling, mass and lump, neck: Secondary | ICD-10-CM | POA: Diagnosis not present

## 2023-08-25 DIAGNOSIS — C8208 Follicular lymphoma grade I, lymph nodes of multiple sites: Secondary | ICD-10-CM | POA: Diagnosis present

## 2023-08-25 DIAGNOSIS — K861 Other chronic pancreatitis: Secondary | ICD-10-CM | POA: Insufficient documentation

## 2023-08-25 DIAGNOSIS — C8200 Follicular lymphoma grade I, unspecified site: Secondary | ICD-10-CM | POA: Insufficient documentation

## 2023-08-25 LAB — GLUCOSE, CAPILLARY: Glucose-Capillary: 79 mg/dL (ref 70–99)

## 2023-08-25 MED ORDER — FLUDEOXYGLUCOSE F - 18 (FDG) INJECTION
8.5000 | Freq: Once | INTRAVENOUS | Status: AC | PRN
  Administered 2023-08-25: 9.04 via INTRAVENOUS

## 2023-08-26 ENCOUNTER — Other Ambulatory Visit (HOSPITAL_COMMUNITY): Payer: Self-pay | Admitting: Student

## 2023-08-26 ENCOUNTER — Other Ambulatory Visit: Payer: Self-pay | Admitting: Radiology

## 2023-08-26 NOTE — Progress Notes (Signed)
 Patient for US  soft tissue head/neck & possible US  guided Core RT submandibular LN Biopsy on Wed 08/27/23, I called and spoke with the patient's daughter, Harold [name] on the phone and gave pre-procedure instructions. Harold [name] was made aware to have the patient here at 12:30p and check in at the [facility]. Harold [name] stated understanding.  Called 08/26/23

## 2023-08-27 ENCOUNTER — Ambulatory Visit
Admission: RE | Admit: 2023-08-27 | Discharge: 2023-08-27 | Disposition: A | Discharge status: Home / Self Care | Source: Ambulatory Visit | Attending: Physician Assistant | Admitting: Physician Assistant

## 2023-08-27 ENCOUNTER — Ambulatory Visit

## 2023-08-27 DIAGNOSIS — C8208 Follicular lymphoma grade I, lymph nodes of multiple sites: Secondary | ICD-10-CM | POA: Insufficient documentation

## 2023-08-27 DIAGNOSIS — R220 Localized swelling, mass and lump, head: Secondary | ICD-10-CM | POA: Insufficient documentation

## 2023-08-27 DIAGNOSIS — C8290 Follicular lymphoma, unspecified, unspecified site: Secondary | ICD-10-CM | POA: Diagnosis not present

## 2023-08-27 MED ORDER — LIDOCAINE HCL (PF) 1 % IJ SOLN
7.0000 mL | Freq: Once | INTRAMUSCULAR | Status: AC
  Administered 2023-08-27: 7 mL via INTRADERMAL
  Filled 2023-08-27: qty 8

## 2023-08-27 NOTE — Discharge Instructions (Signed)
 Reviewed with patient

## 2023-08-29 LAB — SURGICAL PATHOLOGY

## 2023-09-01 ENCOUNTER — Ambulatory Visit: Payer: Medicare Other | Admitting: Hematology

## 2023-09-01 ENCOUNTER — Other Ambulatory Visit: Payer: Medicare Other

## 2023-09-04 ENCOUNTER — Inpatient Hospital Stay

## 2023-09-04 ENCOUNTER — Ambulatory Visit: Admitting: Hematology

## 2023-09-04 ENCOUNTER — Inpatient Hospital Stay: Admitting: Hematology

## 2023-09-04 ENCOUNTER — Other Ambulatory Visit

## 2023-09-04 VITALS — BP 125/55 | HR 74 | Temp 97.2°F | Resp 18 | Wt 159.4 lb

## 2023-09-04 DIAGNOSIS — C8208 Follicular lymphoma grade I, lymph nodes of multiple sites: Secondary | ICD-10-CM

## 2023-09-04 DIAGNOSIS — C8218 Follicular lymphoma grade II, lymph nodes of multiple sites: Secondary | ICD-10-CM

## 2023-09-04 DIAGNOSIS — Z5111 Encounter for antineoplastic chemotherapy: Secondary | ICD-10-CM | POA: Diagnosis not present

## 2023-09-04 LAB — CBC WITH DIFFERENTIAL (CANCER CENTER ONLY)
Abs Immature Granulocytes: 0.03 10*3/uL (ref 0.00–0.07)
Basophils Absolute: 0 10*3/uL (ref 0.0–0.1)
Basophils Relative: 0 %
Eosinophils Absolute: 0.1 10*3/uL (ref 0.0–0.5)
Eosinophils Relative: 1 %
HCT: 35 % — ABNORMAL LOW (ref 39.0–52.0)
Hemoglobin: 11.3 g/dL — ABNORMAL LOW (ref 13.0–17.0)
Immature Granulocytes: 1 %
Lymphocytes Relative: 20 %
Lymphs Abs: 1.3 10*3/uL (ref 0.7–4.0)
MCH: 27.4 pg (ref 26.0–34.0)
MCHC: 32.3 g/dL (ref 30.0–36.0)
MCV: 85 fL (ref 80.0–100.0)
Monocytes Absolute: 0.7 10*3/uL (ref 0.1–1.0)
Monocytes Relative: 11 %
Neutro Abs: 4.5 10*3/uL (ref 1.7–7.7)
Neutrophils Relative %: 67 %
Platelet Count: 234 10*3/uL (ref 150–400)
RBC: 4.12 MIL/uL — ABNORMAL LOW (ref 4.22–5.81)
RDW: 15.4 % (ref 11.5–15.5)
WBC Count: 6.6 10*3/uL (ref 4.0–10.5)
nRBC: 0 % (ref 0.0–0.2)

## 2023-09-04 LAB — CMP (CANCER CENTER ONLY)
ALT: 10 U/L (ref 0–44)
AST: 17 U/L (ref 15–41)
Albumin: 4.2 g/dL (ref 3.5–5.0)
Alkaline Phosphatase: 64 U/L (ref 38–126)
Anion gap: 8 (ref 5–15)
BUN: 28 mg/dL — ABNORMAL HIGH (ref 8–23)
CO2: 33 mmol/L — ABNORMAL HIGH (ref 22–32)
Calcium: 9.6 mg/dL (ref 8.9–10.3)
Chloride: 105 mmol/L (ref 98–111)
Creatinine: 1.16 mg/dL (ref 0.61–1.24)
GFR, Estimated: >60 mL/min (ref >60)
Glucose, Bld: 94 mg/dL (ref 70–99)
Potassium: 3.6 mmol/L (ref 3.5–5.1)
Sodium: 146 mmol/L — ABNORMAL HIGH (ref 135–145)
Total Bilirubin: 0.9 mg/dL (ref 0.0–1.2)
Total Protein: 6.8 g/dL (ref 6.5–8.1)

## 2023-09-04 LAB — LACTATE DEHYDROGENASE: LDH: 188 U/L (ref 98–192)

## 2023-09-04 MED ORDER — ALLOPURINOL 100 MG PO TABS: 100.0000 mg | ORAL_TABLET | Freq: Two times a day (BID) | ORAL | 0 refills | Status: DC

## 2023-09-04 MED ORDER — PREDNISONE 20 MG PO TABS: ORAL_TABLET | ORAL | 0 refills | Status: AC

## 2023-09-04 NOTE — Progress Notes (Signed)
 HEMATOLOGY/ONCOLOGY CLINIC NOTE  Date of Service: 09/04/2023  Patient Care Team: [name], MD as PCP - General (Family Medicine) Verlin Lonni BIRCH, MD as PCP - Cardiology (Cardiology)  CHIEF COMPLAINTS/PURPOSE OF CONSULTATION:  Follow-up for continued evaluation and management of low-grade follicular lymphoma   HISTORY OF PRESENTING ILLNESS:  please see previous notes for details on initial presentation.  INTERVAL HISTORY:   Harold [name] is a 89 y.o. male here for continued evaluation and management of his low-grade follicular lymphoma.   Patient was last seen by me on 03/05/2023 and reported stable knots in right side of face and bilateral sides of neck and precancerous spot on ears.  He was seen by [facility] on 08/20/2023 and reported a mass around his right jaw enlarging quickly.   Patient presents with a walker and is accompanied by his daughter.   He notes that he has noticed his right jaw mass being present for 2 months. Patient reports that his right jaw mass is continuing to grow, and has extended to include and swelling to the chin area too in the last couple of weeks.   Patient notes that he was previously seen by his PCP, Dr. Baldwin, and was on steroids for 10 days, which shrunk his right jaw mass for some time temporarily and also increased his energy.   He denies any change in bowel habits.   He reports an incident in which he took a vitamin tablet, which he initially thought he had swallowed. However, his vitamin tablet became stuck and he endorsed burning sensation from the tablet and eventually dissolved during his drive to [place]. He denies any swallowing problems otherwise.   He notes that he previously had a shingles shot 40 years ago. Patient also received the new shingles vaccine 2 years ago.   He notes that he did previously take allopurinol  when taking rituxan  in the past.   Patient reports some concern, reporting that his wife passed away  from radiation therapy exposure.   Patient's daughter reports that she has a short vacation planned and will leave on Monday, 09/08/2023, and will return on Thursday, 09/11/2023. We will start treatment the Following Monday if possible, 09/15/2023.   MEDICAL HISTORY:  Past Medical History:  Diagnosis Date   BPH (benign prostatic hypertrophy)    CAD (coronary artery disease) 01/2010   S/P drug eluting stents LAD and PL branch of [facility] Dr. 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   follicular lymphoma dx'd 06/2017   Hernia    HLD (hyperlipidemia)    HTN (hypertension)     SURGICAL HISTORY: Past Surgical History:  Procedure Laterality Date   BACK SURGERY     CORONARY ANGIOGRAPHY N/A 01/29/2023   Procedure: CORONARY ANGIOGRAPHY;  Surgeon: Verlin Lonni BIRCH, MD;  Location: [facility];  Service: Cardiovascular;  Laterality: N/A;   CORONARY BALLOON ANGIOPLASTY N/A 01/29/2023   Procedure: CORONARY BALLOON ANGIOPLASTY;  Surgeon: Verlin Lonni BIRCH, MD;  Location: [facility];  Service: Cardiovascular;  Laterality: N/A;   CORONARY LITHOTRIPSY N/A 01/29/2023   Procedure: CORONARY LITHOTRIPSY;  Surgeon: Verlin Lonni BIRCH, MD;  Location: [facility];  Service: Cardiovascular;  Laterality: N/A;   CYSTOSCOPY WITH LITHOLAPAXY N/A 05/20/2019   Procedure: CYSTOSCOPY WITH LITHOLAPAXY;  Surgeon: Cam Morene ORN, MD;  Location: [facility];  Service: Urology;  Laterality: N/A;   CYSTOSCOPY/URETEROSCOPY/HOLMIUM LASER/STENT PLACEMENT Left 05/20/2019   Procedure: CYSTOSCOPY/URETEROSCOPY/HOLMIUM  LASER/STENT PLACEMENT;  Surgeon: Cam Morene ORN, MD;  Location: [facility];  Service: Urology;  Laterality: Left;   HERNIA REPAIR  01/08/11   RIH   MASS EXCISION Right 08/05/2017   Procedure: EXCISION RIGHT POSTERIOR  NECK LYMPH NODE;  Surgeon: Ethyl Lonni BRAVO, MD;  Location: [facility];  Service: ENT;  Laterality: Right;     SOCIAL HISTORY: Social History   Socioeconomic History   Marital status: Married    Spouse name: Not on file   Number of children: Not on file   Years of education: Not on file   Highest education level: Not on file  Occupational History   Not on file  Tobacco Use   Smoking status: Never   Smokeless tobacco: Never  Vaping Use   Vaping status: Never Used  Substance and Sexual Activity   Alcohol use: No   Drug use: No   Sexual activity: Not on file  Other Topics Concern   Not on file  Social History Narrative   Married   Social Drivers of Health   Financial Resource Strain: Low Risk  (11/17/2022)   Received from [facility]   Overall Financial Resource Strain (CARDIA)    Difficulty of Paying Living Expenses: Not very hard  Food Insecurity: No Food Insecurity (06/04/2023)   Received from [facility]   Hunger Vital Sign    Within the past 12 months, you worried that your food would run out before you got the money to buy more.: Never true    Within the past 12 months, the food you bought just didn't last and you didn't have money to get more.: Never true  Transportation Needs: No Transportation Needs (06/04/2023)   Received from [facility]   PRAPARE - Transportation    Lack of Transportation (Medical): No    Lack of Transportation (Non-Medical): No  Physical Activity: Inactive (11/17/2022)   Received from [facility]   Exercise Vital Sign    On average, how many days per week do you engage in moderate to strenuous exercise (like a brisk walk)?: 0 days    On average, how many minutes do you engage in exercise at this level?: 0 min  Stress: Stress Concern Present (11/17/2022)   Received from [facility] - Occupational Stress Questionnaire    Feeling of Stress : Rather much  Social Connections: Moderately Isolated (11/17/2022)   Received from [facility]   Social Connection and Isolation Panel    In a  typical week, how many times do you talk on the phone with family, friends, or neighbors?: More than three times a week    How often do you get together with friends or relatives?: More than three times a week    How often do you attend church or religious services?: More than 4 times per year    Do you belong to any clubs or organizations such as church groups, unions, fraternal or athletic groups, or school groups?: No    How often do you attend meetings of the clubs or organizations you belong to?: Never    Are you married, widowed, divorced, separated, never married, or living with a partner?: Widowed  Intimate Partner Violence: Not At Risk (06/04/2023)   Received from [facility]   Humiliation, Afraid, Rape, and Kick questionnaire    Within the last year, have you been afraid of your partner or ex-partner?: No  Within the last year, have you been humiliated or emotionally abused in other ways by your partner or ex-partner?: No    Within the last year, have you been kicked, hit, slapped, or otherwise physically hurt by your partner or ex-partner?: No    Within the last year, have you been raped or forced to have any kind of sexual activity by your partner or ex-partner?: No    FAMILY HISTORY: Family History  Problem Relation Age of Onset   Heart failure Mother     ALLERGIES:  has no known allergies.  MEDICATIONS:  Current Outpatient Medications  Medication Sig Dispense Refill   apixaban  (ELIQUIS ) 5 MG TABS tablet Take 2.5 mg by mouth 2 (two) times daily.     atenolol  (TENORMIN ) 25 MG tablet Take 1 tablet (25 mg total) by mouth daily. 90 tablet 3   brimonidine  (ALPHAGAN ) 0.2 % ophthalmic solution Place 1 drop into the right eye 2 (two) times daily.     cholecalciferol (VITAMIN D) 1000 UNITS tablet Take 1,000 Units by mouth daily.     Cyanocobalamin (B-12) 2500 MCG TABS Take 2,500 mcg by mouth daily.     finasteride  (PROSCAR ) 5 MG tablet Take 5 mg by mouth daily.     furosemide   (LASIX ) 20 MG tablet TAKE 1 TABLET BY MOUTH TWICE  DAILY 180 tablet 3   gabapentin (NEURONTIN) 100 MG capsule Take 1 capsule by mouth 2 (two) times daily as needed (for knee pain).     nitroGLYCERIN  (NITROSTAT ) 0.4 MG SL tablet Place 1 tablet (0.4 mg total) under the tongue every 5 (five) minutes as needed for chest pain. 25 tablet 6   potassium chloride  SA (KLOR-CON  M) 20 MEQ tablet Take 1 tablet (20 mEq total) by mouth daily. Please keep upcoming appointment for future refills. Thank you.     pravastatin  (PRAVACHOL ) 40 MG tablet Take 1 tablet (40 mg total) by mouth at bedtime. 90 tablet 3   sucralfate (CARAFATE) 1 g tablet Take 1 g by mouth 4 (four) times daily -  with meals and at bedtime.     ticagrelor  (BRILINTA ) 90 MG TABS tablet Take 1 tablet (90 mg total) by mouth 2 (two) times daily. 180 tablet 1   No current facility-administered medications for this visit.    REVIEW OF SYSTEMS:    10 Point review of Systems was done is negative except as noted above.   PHYSICAL EXAMINATION: ECOG FS:2 - Symptomatic, <50% confined to bed  Vitals:   09/04/23 1034  BP: (!) 125/55  Pulse: 74  Resp: 18  Temp: (!) 97.2 F (36.2 C)  SpO2: 100%     Wt Readings from Last 3 Encounters:  08/20/23 164 lb 1.6 oz (74.4 kg)  03/05/23 155 lb 3.2 oz (70.4 kg)  02/24/23 150 lb (68 kg)   There is no height or weight on file to calculate BMI.     GENERAL:alert, in no acute distress and comfortable SKIN: no acute rashes, no significant lesions EYES: conjunctiva are pink and non-injected, sclera anicteric OROPHARYNX: MMM, no exudates, no oropharyngeal erythema or ulceration NECK: supple, no JVD LYMPH:  no palpable lymphadenopathy in the cervical, axillary or inguinal regions LUNGS: clear to auscultation b/l with normal respiratory effort HEART: regular rate & rhythm ABDOMEN:  normoactive bowel sounds , non tender, not distended. Extremity: no pedal edema PSYCH: alert & oriented x 3 with fluent  speech NEURO: no focal motor/sensory deficits   LABORATORY DATA:  I have reviewed the data  as listed     Latest Ref Rng & Units 08/20/2023    9:26 AM 03/05/2023    8:23 AM 01/30/2023    2:39 AM  CBC  WBC 4.0 - 10.5 K/uL 7.7  7.2  6.1   Hemoglobin 13.0 - 17.0 g/dL 88.6  89.1  9.5   Hematocrit 39.0 - 52.0 % 35.0  34.9  30.2   Platelets 150 - 400 K/uL 210  240  207        Latest Ref Rng & Units 08/20/2023    9:26 AM 03/05/2023    8:23 AM 01/30/2023    2:39 AM  CMP  Glucose 70 - 99 mg/dL 98  97  99   BUN 8 - 23 mg/dL 32  20  16   Creatinine 0.61 - 1.24 mg/dL 8.96  9.12  8.99   Sodium 135 - 145 mmol/L 143  143  139   Potassium 3.5 - 5.1 mmol/L 3.9  4.0  3.5   Chloride 98 - 111 mmol/L 103  105  108   CO2 22 - 32 mmol/L 34  33  25   Calcium 8.9 - 10.3 mg/dL 9.5  9.6  8.3   Total Protein 6.5 - 8.1 g/dL 6.8  6.4    Total Bilirubin 0.0 - 1.2 mg/dL 0.7  0.6    Alkaline Phos 38 - 126 U/L 63  77    AST 15 - 41 U/L 14  12    ALT 0 - 44 U/L 11  8     Lab Results  Component Value Date   LDH 164 08/20/2023    08/27/2023 Surgical Pathology:      08/27/2023 Surgical Pathology:        02/18/2019 CT Abdomen Pelvis W Contrast (Accession [id number]):   02/18/2019 CT Chest W Contrast (Accession [id number]):  08/08/17 Pathology:   08/05/17 Pathology:     RADIOGRAPHIC STUDIES: I have personally reviewed the radiological images as listed and agreed with the findings in the report. US  Soft Tissue Head/Neck Result Date: 08/27/2023 CLINICAL DATA:  Right facial mass EXAM: ULTRASOUND OF HEAD/NECK SOFT TISSUES TECHNIQUE: Ultrasound examination of the head and neck soft tissues was performed in the area of clinical concern. COMPARISON:  None Available. FINDINGS: Sonographic interrogation of the region of clinical concern demonstrates a diffuse abnormal and ill-defined hypoechoic soft tissue mass in casing the right mandible and filling the submandibular space. Color Doppler evaluation  demonstrates numerous arterial vessels throughout the mass likely representing the facial artery and its branches. IMPRESSION: Amorphous and ill-defined mass wrapping around the right mandible and filling the submandibular space. Findings are concerning for lymphoproliferative process as suspected. Numerous arterial branches are visualized within the mass likely representing the facial artery and its branches. Electronically Signed   By: Wilkie Lent M.D.   On: 08/27/2023 14:56   US  CORE BIOPSY (LYMPH NODES) Result Date: 08/27/2023 INDICATION: History of follicular lymphoma with recurrent multifocal hypermetabolic lymphadenopathy and right jaw mass concerning for recurrent disease versus transformation into a more aggressive phenotype. The right facial mass demonstrates significant internal vascularity and in cases the facial nerve and artery. Additionally, the skin is taut over the lesion. Therefore, a more optimal biopsy target can be found in the right axillary region where there are several enlarged and hypermetabolic lymph nodes. EXAM: ULTRASOUND BIOPSY OF THE LYMPH NODES MEDICATIONS: None. ANESTHESIA/SEDATION: None. FLUOROSCOPY TIME:  None. COMPLICATIONS: None immediate. PROCEDURE: Informed written consent was obtained from the patient after a thorough discussion  of the procedural risks, benefits and alternatives. All questions were addressed. Maximal Sterile Barrier Technique was utilized including caps, mask, sterile gowns, sterile gloves, sterile drape, hand hygiene and skin antiseptic. A timeout was performed prior to the initiation of the procedure. The skin was sterilely prepped and draped in the standard fashion using chlorhexidine  skin prep. Local anesthesia was attained by infiltration with 1% lidocaine . A small dermatotomy was made. Under real-time ultrasound guidance, multiple 18 gauge core biopsies were obtained of the enlarged and abnormal appearing right axillary lymph node. Biopsy  specimens were placed on a moistened Telfa pad and delivered to pathology for further analysis. IMPRESSION: Successful ultrasound-guided core biopsy of enlarged hypermetabolic right axillary lymph node. Biopsy of the right facial mass was deferred as described above. Electronically Signed   By: Wilkie Lent M.D.   On: 08/27/2023 14:54   NM PET Image Restag (PS) Skull Base To Thigh Result Date: 08/25/2023 CLINICAL DATA:  Subsequent treatment strategy for follicular lymphoma. Right neck lump. EXAM: NUCLEAR MEDICINE PET SKULL BASE TO THIGH TECHNIQUE: 9.35 mCi F-18 FDG was injected intravenously. Full-ring PET imaging was performed from the skull base to thigh after the radiotracer. CT data was obtained and used for attenuation correction and anatomic localization. Fasting blood glucose: 79 mg/dl COMPARISON:  PET-CT 94/96/7976. CT of the chest, abdomen and pelvis 02/20/2023. FINDINGS: Mediastinal blood pool activity: SUV max 2.1 Liver activity: SUV max 2.8 NECK: There is a large hypermetabolic subcutaneous mass within the right face, centered along the right body of the mandible. This measures up to 6.8 x 2.8 cm on image 27/6 and demonstrates an SUV max of 33.3. There are numerous new hypermetabolic cervical lymph nodes bilaterally. Representative nodes include left level IIA node with an SUV max of 14.0 and right level IV nodes within SUV max of 14.8. No suspicious activity identified within the pharyngeal mucosal space. Incidental CT findings: Bilateral carotid atherosclerosis. CHEST: There are multiple new hypermetabolic and mildly enlarged mediastinal, hilar and axillary lymph nodes. Representative lymph nodes include a posterior subcarinal node measuring 2.6 x 2.1 cm on image 56/6 (SUV max 10.8), a 1.4 cm short axis right axillary node on image 53/6 (SUV max 14.4) and a left axillary node measuring 0.9 cm short axis on image 49/6 (SUV max 12.6). These lymph nodes have enlarged compared with the most recent  diagnostic CT. No hypermetabolic pulmonary activity or suspicious nodularity. Incidental CT findings: Atherosclerosis of the aorta, great vessels and coronary arteries. Calcifications of the aortic valve. ABDOMEN/PELVIS: New mild hypermetabolic activity inferiorly within the spleen (SUV max 5.6). The spleen is normal in size. No hypermetabolic activity identified within the liver, adrenal glands or pancreas. There are multiple recurrent hypermetabolic and enlarged lymph nodes within the abdomen. Representative nodes include a right retrocrural node measuring 1.2 cm on image 81/6 (SUV max 11.5), a group of portacaval nodes measuring up to 2.4 x 1.2 cm on image 99/6 (SUV max 18.6) and a left periaortic node measuring 1.6 cm on image 106/6 (SUV max 15.6). No enlarged or hypermetabolic lymph nodes are identified in the pelvis or inguinal regions. No definite abnormal bowel or peritoneal activity. Incidental CT findings: Extensive changes of chronic calcific pancreatitis are again noted. Mild aortic and branch vessel atherosclerosis. Chronic bladder wall thickening with dependent calcifications. Postsurgical changes in the right groin without suspicious metabolic activity. SKELETON: New small focus of hypermetabolic activity within the midthoracic spine (SUV max 3.7). No other suspicious osseous activity identified. No suspicious extranodal soft tissue  activity seen. Incidental CT findings: Multilevel spondylosis. IMPRESSION: 1. Findings are consistent with recurrent lymphoma with a large hypermetabolic subcutaneous mass in the right face, multiple hypermetabolic and enlarged lymph nodes in the neck, chest and abdomen (Deauville 5), and a possible splenic and osseous involvement. 2.  Aortic Atherosclerosis (ICD10-I70.0). 3. Stable additional findings as described, including sequela of chronic calcific pancreatitis. Electronically Signed   By: Elsie Perone M.D.   On: 08/25/2023 13:10     ASSESSMENT & PLAN:   89  y.o. 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 No abdominal or pelvic involvement.    03/20/18 CT C/A/P revealed Progressive bilateral axillary and right inguinal lymphadenopathy, including necrotic lymph nodes in the right axilla, as above. Spleen is normal in size. 4 mm distal left ureteral calculus, unchanged. No hydronephrosis. Stable layering bladder calculi measuring up to 5 mm. Cholelithiasis, without associated inflammatory changes. Stable sequela of prior/chronic pancreatitis.   11/03/2018 CT neck revealed Marked interval progression of cervical lymphadenopathy since PET-CT 09/02/2017, now with lymphadenopathy present bilaterally at essentially all stations. Index nodes as described. This includes bilateral intraparotid lymphadenopathy. Bulky right-sided adenopathy results in multifocal narrowing of the right internal jugular vein. Significant narrowing of the mid to lower left internal jugular vein of unclear cause as there is no significant mass effect at this level.  11/03/2018 CT CAP revealed 1. Progression of bilateral axillary, retroperitoneal, pelvic, and inguinal adenopathy. New anterior and posterior lower cervical adenopathy identified within the neck. 2. Persistent distal left ureteral calculus. Small bladder calculi are unchanged. 3. Aortic Atherosclerosis (ICD10-I70.0). Coronary artery calcifications. 4. Sequelae of chronic pancreatitis noted.  10/01/2019 CT Abd/Pel ([id number]) revealed no evidence of mass in right kidney, new and enlarged abdominal and pelvic lymph nodes.   2. 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 3. H/o head and neck Basal cell carcinoma Continue routine skin  checks with dermatologist    4. Renal indeterminate lesion -06/28/19 Renal US  ([id number]) revealed New right-sided mass at the hilum which measures 1.5cm in size.   -resolved on subsequent scans   PLAN:  -Discussed lab results on 09/04/23 in detail with patient. CBC normal, showed WBC of 6.6K, hemoglobin of 11.3, and platelets of 234K. -CMP stable -his PET scan on 08/25/2023 shows Findings are consistent with recurrent lymphoma with a large hypermetabolic subcutaneous mass in the right face, multiple hypermetabolic and enlarged lymph nodes in the neck, chest and abdomen (Deauville 5), and a possible splenic and osseous involvement. -right axillary lymph node needle biopsy on 08/27/2023 shows that there still appears to be low-grade follicular lymphoma with no high-grade process involved. -LDH WNL, which is suggestive against high-grade lymphoma.  -there is no suggestion of obviously high-grade process involved with his lymphoma.  -Discussed that it is possible that certain parts are low-grade and other part high-grade, but there is no way to know if there is a single spot that is high-grade. -discussed possibility that low-grade lymphoma can potentially become high-grade, or can become large B-cell lymphoma, which is much more aggressive  -we will treat his condition as low-grade follicular lymphoma -would not recommend additional biopsy with ENT doctor because it is not absolutely necessary and can potentially delay treatment for greater than 1 month -would not recommend only rituxan   alone may as it may not allow for enough response given extent of his disease -recommend treatment regimen of rituxan  combined with single agent chemotherapy for 2 days every 4 weeks as outpatient for 4-6 cycles with adjusted dosing which would still be as effective as we can. Discussed that she would receive rituxan  on 1/2 days with each session. Discussed that generally, with this regimen, there is  not a need for growth  factor shot, but given his age, his bone marrow reserve can take longer, so we may need growth factor shot depending on blood counts to reduce the risk of infections, and we will decide this from his second cycle.  -we will plan to start treatment in the next 1-2 weeks, and if possible, Monday, 09/15/2023.  -start Prednisone  today and continue for the next 7-10 days to shrink right jaw mass -discussed that steroids may cause sleep disturbances -discussed that he may need to take fluid pill since steroids can cause some fluid retention -start allopurinol  today and continue for 1 month until he finishes the first cycle of treatment for TLS prophylaxis -discussed that if his right jaw mass shrinks a lot but is not gone completely with treatment, there may be a role to consider local radiation at the end of treatment, though we will try to avoid radiation therapy if it is not needed. After his current treatment, we will plan for maintenance treatment to control his disease. -he will have nausea medication available only as needed after treatment, as well as antiviral medication to reduce the risk of shingles and mouth sores.  -discussed option for us  to look into scheduling medical transportation. Patient is not inclined to consider this at this time.  -answered all of patient's and his daughter's questions in detail   FOLLOW-UP: Plz schedule BR as per integrated scheduling Labs and MD visit for toxicity check [id number]  The total time spent in the appointment was 40 minutes* .  All of the patient's questions were answered with apparent satisfaction. The patient knows to call the clinic with any problems, questions or concerns.   Emaline Saran MD MS AAHIVMS [facility] Hematology/Oncology Physician [facility]  .*Total Encounter Time as defined by the Centers for Medicare and Medicaid Services includes, in addition to the face-to-face time of a patient visit (documented in the note above)  non-face-to-face time: obtaining and reviewing outside history, ordering and reviewing medications, tests or procedures, care coordination (communications with other health care professionals or caregivers) and documentation in the medical record.    I,Mitra Faeizi,acting as a Neurosurgeon for Emaline Saran, MD.,have documented all relevant documentation on the behalf of Emaline Saran, MD,as directed by  Emaline Saran, MD while in the presence of Emaline Saran, MD.  .I have reviewed the above documentation for accuracy and completeness, and I agree with the above. .Quaneisha Hanisch Kishore Sipriano Fendley MD

## 2023-09-05 ENCOUNTER — Encounter: Payer: Self-pay | Admitting: Hematology

## 2023-09-05 MED ORDER — DEXAMETHASONE 4 MG PO TABS: 8.0000 mg | ORAL_TABLET | Freq: Every day | ORAL | 1 refills | Status: DC

## 2023-09-05 MED ORDER — ACYCLOVIR 400 MG PO TABS: 400.0000 mg | ORAL_TABLET | Freq: Every day | ORAL | 3 refills | Status: DC

## 2023-09-05 MED ORDER — PROCHLORPERAZINE MALEATE 10 MG PO TABS
10.0000 mg | ORAL_TABLET | Freq: Four times a day (QID) | ORAL | 1 refills | Status: DC | PRN
Start: 2023-09-05 — End: 2023-10-13

## 2023-09-05 MED ORDER — ONDANSETRON HCL 8 MG PO TABS: 8.0000 mg | ORAL_TABLET | Freq: Three times a day (TID) | ORAL | 1 refills | Status: DC | PRN

## 2023-09-05 NOTE — Progress Notes (Signed)
 DISCONTINUE ON PATHWAY REGIMEN - Lymphoma and CLL     Administer weekly:     Rituximab -xxxx   **Always confirm dose/schedule in your pharmacy ordering system**  PRIOR TREATMENT: LYOS201: Rituximab  375 mg/m2 IV Weekly x 4 Weeks  START ON PATHWAY REGIMEN - Lymphoma and CLL     A cycle is every 28 days:     Rituximab -xxxx      Bendamustine   **Always confirm dose/schedule in your pharmacy ordering system**  Patient Characteristics: Follicular Lymphoma, Grades 1, 2, and 3A, Second Line, Prior Treatment with Rituximab  Alone, Relapse < 24 Months Disease Type: Follicular Lymphoma, Grade 1, 2, or 3A Disease Type: Not Applicable Disease Type: Not Applicable Line of Therapy: Second Line Prior Treatment: Prior Treatment with Rituximab  Alone Time to Relapse: Relapse < 24 Months Intent of Therapy: Non-Curative / Palliative Intent, Discussed with Patient

## 2023-09-06 ENCOUNTER — Other Ambulatory Visit: Payer: Self-pay

## 2023-09-08 NOTE — Progress Notes (Signed)
 Pharmacist Chemotherapy Monitoring - Initial Assessment    Anticipated start date: 09/15/23  The following has been reviewed per standard work regarding the patient's treatment regimen: The patient's diagnosis, treatment plan and drug doses, and organ/hematologic function Lab orders and baseline tests specific to treatment regimen  The treatment plan start date, drug sequencing, and pre-medications Prior authorization status  Patient's documented medication list, including drug-drug interaction screen and prescriptions for anti-emetics and supportive care specific to the treatment regimen The drug concentrations, fluid compatibility, administration routes, and timing of the medications to be used The patient's access for treatment and lifetime cumulative dose history, if applicable  The patient's medication allergies and previous infusion related reactions, if applicable   Changes made to treatment plan:  N/A  Follow up needed:  Pending authorization for treatment , adding pre-medications, and Hepatitis B Ab and Ag results  Staff message sent to Dr. [name] informing him that patient received Solu-Medrol  125mg  and Pepcid  IV 20mg  as premeds for Ruxience  in 2022-23. Requested the ok to subsitute SM for dex and add Pepcid .  Patient tolerated rapid Ruxience  with previous tx. F/u appropriateness to change to rapid with C2.   Hepatitis B core Ab and surface Ag were negative in 2019. Asked MD if new tests were needed for pt to initiate this tx or if we could cancel them.  Takima Encina, PharmD, MBA

## 2023-09-11 ENCOUNTER — Encounter: Payer: Self-pay | Admitting: Hematology

## 2023-09-15 ENCOUNTER — Inpatient Hospital Stay

## 2023-09-15 VITALS — BP 103/50 | HR 77 | Temp 97.6°F | Resp 16

## 2023-09-15 DIAGNOSIS — C8218 Follicular lymphoma grade II, lymph nodes of multiple sites: Secondary | ICD-10-CM

## 2023-09-15 DIAGNOSIS — Z5111 Encounter for antineoplastic chemotherapy: Secondary | ICD-10-CM | POA: Diagnosis not present

## 2023-09-15 LAB — CMP (CANCER CENTER ONLY)
ALT: 12 U/L (ref 0–44)
AST: 14 U/L — ABNORMAL LOW (ref 15–41)
Albumin: 4.3 g/dL (ref 3.5–5.0)
Alkaline Phosphatase: 61 U/L (ref 38–126)
Anion gap: 9 (ref 5–15)
BUN: 29 mg/dL — ABNORMAL HIGH (ref 8–23)
CO2: 32 mmol/L (ref 22–32)
Calcium: 9.7 mg/dL (ref 8.9–10.3)
Chloride: 104 mmol/L (ref 98–111)
Creatinine: 1.13 mg/dL (ref 0.61–1.24)
GFR, Estimated: >60 mL/min (ref >60)
Glucose, Bld: 96 mg/dL (ref 70–99)
Potassium: 3.3 mmol/L — ABNORMAL LOW (ref 3.5–5.1)
Sodium: 145 mmol/L (ref 135–145)
Total Bilirubin: 0.8 mg/dL (ref 0.0–1.2)
Total Protein: 6.9 g/dL (ref 6.5–8.1)

## 2023-09-15 LAB — CBC WITH DIFFERENTIAL (CANCER CENTER ONLY)
Abs Immature Granulocytes: 0.1 10*3/uL — ABNORMAL HIGH (ref 0.00–0.07)
Basophils Absolute: 0.1 10*3/uL (ref 0.0–0.1)
Basophils Relative: 1 %
Eosinophils Absolute: 0.1 10*3/uL (ref 0.0–0.5)
Eosinophils Relative: 1 %
HCT: 38.6 % — ABNORMAL LOW (ref 39.0–52.0)
Hemoglobin: 12.3 g/dL — ABNORMAL LOW (ref 13.0–17.0)
Immature Granulocytes: 1 %
Lymphocytes Relative: 18 %
Lymphs Abs: 1.8 10*3/uL (ref 0.7–4.0)
MCH: 27.2 pg (ref 26.0–34.0)
MCHC: 31.9 g/dL (ref 30.0–36.0)
MCV: 85.4 fL (ref 80.0–100.0)
Monocytes Absolute: 1 10*3/uL (ref 0.1–1.0)
Monocytes Relative: 10 %
Neutro Abs: 7 10*3/uL (ref 1.7–7.7)
Neutrophils Relative %: 69 %
Platelet Count: 226 10*3/uL (ref 150–400)
RBC: 4.52 MIL/uL (ref 4.22–5.81)
RDW: 15.4 % (ref 11.5–15.5)
WBC Count: 10.1 10*3/uL (ref 4.0–10.5)
nRBC: 0 % (ref 0.0–0.2)

## 2023-09-15 LAB — HEPATITIS B SURFACE ANTIGEN: Hepatitis B Surface Ag: NONREACTIVE

## 2023-09-15 MED ORDER — SODIUM CHLORIDE 0.9 % IV SOLN: INTRAVENOUS | Status: DC

## 2023-09-15 MED ORDER — SODIUM CHLORIDE 0.9% FLUSH: 10.0000 mL | INTRAVENOUS | Status: DC | PRN

## 2023-09-15 MED ORDER — FAMOTIDINE IN NACL 20-0.9 MG/50ML-% IV SOLN
20.0000 mg | Freq: Once | INTRAVENOUS | Status: AC
  Administered 2023-09-15: 20 mg via INTRAVENOUS
  Filled 2023-09-15: qty 50

## 2023-09-15 MED ORDER — ACETAMINOPHEN 325 MG PO TABS
650.0000 mg | ORAL_TABLET | Freq: Once | ORAL | Status: AC
Start: 2023-09-15 — End: 2023-09-15
  Administered 2023-09-15: 650 mg via ORAL
  Filled 2023-09-15: qty 2

## 2023-09-15 MED ORDER — PALONOSETRON HCL INJECTION 0.25 MG/5ML
0.2500 mg | Freq: Once | INTRAVENOUS | Status: AC
  Administered 2023-09-15: 0.25 mg via INTRAVENOUS
  Filled 2023-09-15: qty 5

## 2023-09-15 MED ORDER — SODIUM CHLORIDE 0.9 % IV SOLN
70.0000 mg/m2 | Freq: Once | INTRAVENOUS | Status: AC
  Administered 2023-09-15: 132.5 mg via INTRAVENOUS
  Filled 2023-09-15: qty 5.3

## 2023-09-15 MED ORDER — METHYLPREDNISOLONE SODIUM SUCC 125 MG IJ SOLR
125.0000 mg | Freq: Once | INTRAMUSCULAR | Status: AC
  Administered 2023-09-15: 125 mg via INTRAVENOUS
  Filled 2023-09-15: qty 2

## 2023-09-15 MED ORDER — SODIUM CHLORIDE 0.9 % IV SOLN
375.0000 mg/m2 | Freq: Once | INTRAVENOUS | Status: AC
  Administered 2023-09-15: 700 mg via INTRAVENOUS
  Filled 2023-09-15: qty 20

## 2023-09-15 MED ORDER — DIPHENHYDRAMINE HCL 25 MG PO CAPS
50.0000 mg | ORAL_CAPSULE | Freq: Once | ORAL | Status: AC
Start: 2023-09-15 — End: 2023-09-15
  Administered 2023-09-15: 50 mg via ORAL
  Filled 2023-09-15: qty 2

## 2023-09-15 NOTE — Patient Instructions (Signed)
 [facility] - A DEPT OF [facility]  Discharge Instructions: Thank you for choosing [facility] to provide your oncology and hematology care.   If you have a lab appointment with the Cancer Center, please go directly to the Cancer Center and check in at the registration area.   Wear comfortable clothing and clothing appropriate for easy access to any Portacath or PICC line.   We strive to give you quality time with your provider. You may need to reschedule your appointment if you arrive late (15 or more minutes).  Arriving late affects you and other patients whose appointments are after yours.  Also, if you miss three or more appointments without notifying the office, you may be dismissed from the clinic at the provider's discretion.      For prescription refill requests, have your pharmacy contact our office and allow 72 hours for refills to be completed.    Today you received the following chemotherapy and/or immunotherapy agents Rituxan , Bendamustine      To help prevent nausea and vomiting after your treatment, we encourage you to take your nausea medication as directed.  BELOW ARE SYMPTOMS THAT SHOULD BE REPORTED IMMEDIATELY: *FEVER GREATER THAN 100.4 F (38 C) OR HIGHER *CHILLS OR SWEATING *NAUSEA AND VOMITING THAT IS NOT CONTROLLED WITH YOUR NAUSEA MEDICATION *UNUSUAL SHORTNESS OF BREATH *UNUSUAL BRUISING OR BLEEDING *URINARY PROBLEMS (pain or burning when urinating, or frequent urination) *BOWEL PROBLEMS (unusual diarrhea, constipation, pain near the anus) TENDERNESS IN MOUTH AND THROAT WITH OR WITHOUT PRESENCE OF ULCERS (sore throat, sores in mouth, or a toothache) UNUSUAL RASH, SWELLING OR PAIN  UNUSUAL VAGINAL DISCHARGE OR ITCHING   Items with * indicate a potential emergency and should be followed up as soon as possible or go to the Emergency Department if any problems should occur.  Please show the CHEMOTHERAPY ALERT CARD or  IMMUNOTHERAPY ALERT CARD at check-in to the Emergency Department and triage nurse.  Should you have questions after your visit or need to cancel or reschedule your appointment, please contact [facility] - A DEPT OF [facility]  Dept: [phone]  and follow the prompts.  Office hours are 8:00 a.m. to 4:30 p.m. Monday - Friday. Please note that voicemails left after 4:00 p.m. may not be returned until the following business day.  We are closed weekends and major holidays. You have access to a nurse at all times for urgent questions. Please call the main number to the clinic Dept: [phone] and follow the prompts.   For any non-urgent questions, you may also contact your provider using MyChart. We now offer e-Visits for anyone 73 and older to request care online for non-urgent symptoms. For details visit [email or web address].   Also download the MyChart app! Go to the app store, search MyChart, open the app, select , and log in with your MyChart username and password.

## 2023-09-16 ENCOUNTER — Inpatient Hospital Stay: Attending: Hematology

## 2023-09-16 VITALS — BP 126/55 | HR 69 | Temp 97.6°F | Resp 19

## 2023-09-16 DIAGNOSIS — Z5111 Encounter for antineoplastic chemotherapy: Secondary | ICD-10-CM | POA: Diagnosis present

## 2023-09-16 DIAGNOSIS — Z79899 Other long term (current) drug therapy: Secondary | ICD-10-CM | POA: Diagnosis not present

## 2023-09-16 DIAGNOSIS — Z7901 Long term (current) use of anticoagulants: Secondary | ICD-10-CM | POA: Diagnosis not present

## 2023-09-16 DIAGNOSIS — Z85820 Personal history of malignant melanoma of skin: Secondary | ICD-10-CM | POA: Insufficient documentation

## 2023-09-16 DIAGNOSIS — Z7952 Long term (current) use of systemic steroids: Secondary | ICD-10-CM | POA: Diagnosis not present

## 2023-09-16 DIAGNOSIS — Z79624 Long term (current) use of inhibitors of nucleotide synthesis: Secondary | ICD-10-CM | POA: Diagnosis not present

## 2023-09-16 DIAGNOSIS — Z85828 Personal history of other malignant neoplasm of skin: Secondary | ICD-10-CM | POA: Insufficient documentation

## 2023-09-16 DIAGNOSIS — C8218 Follicular lymphoma grade II, lymph nodes of multiple sites: Secondary | ICD-10-CM

## 2023-09-16 DIAGNOSIS — C8290 Follicular lymphoma, unspecified, unspecified site: Secondary | ICD-10-CM | POA: Insufficient documentation

## 2023-09-16 LAB — HEPATITIS B CORE ANTIBODY, TOTAL: HEP B CORE AB: POSITIVE — AB

## 2023-09-16 MED ORDER — SODIUM CHLORIDE 0.9 % IV SOLN
70.0000 mg/m2 | Freq: Once | INTRAVENOUS | Status: AC
  Administered 2023-09-16: 132.5 mg via INTRAVENOUS
  Filled 2023-09-16: qty 5.3

## 2023-09-16 MED ORDER — SODIUM CHLORIDE 0.9 % IV SOLN: INTRAVENOUS | Status: DC

## 2023-09-16 MED ORDER — DEXAMETHASONE SODIUM PHOSPHATE 10 MG/ML IJ SOLN
10.0000 mg | Freq: Once | INTRAMUSCULAR | Status: AC
  Administered 2023-09-16: 10 mg via INTRAVENOUS
  Filled 2023-09-16: qty 1

## 2023-09-26 ENCOUNTER — Other Ambulatory Visit: Payer: Self-pay

## 2023-09-26 DIAGNOSIS — C8218 Follicular lymphoma grade II, lymph nodes of multiple sites: Secondary | ICD-10-CM

## 2023-09-28 NOTE — Progress Notes (Signed)
 HEMATOLOGY/ONCOLOGY CLINIC NOTE  Date of Service: 09/29/2023  Patient Care Team: [name], MD as PCP - General (Family Medicine) Verlin Lonni BIRCH, MD as PCP - Cardiology (Cardiology)  CHIEF COMPLAINTS/PURPOSE OF CONSULTATION:  Follow-up for continued evaluation and management of low-grade follicular lymphoma   HISTORY OF PRESENTING ILLNESS:  please see previous notes for details on initial presentation.  INTERVAL HISTORY:   Harold [name] is a 89 y.o. male here for continued evaluation and management of his low-grade follicular lymphoma.   Patient was last seen by me on 09/04/2023 and reported that his right jaw mass was continuing to grow with swelling to the chin area and also reported an incident of vitamin tablet becoming stuck causing burning sensation.  Patient has received C1 of BR and notes significantly improvement generalized LNadenopathy including significant shrinkage of rt jaw mass. He has developed from skin erythema due to shaving vs cellulitis and was prescribed antibiotics. No significant notable toxicities from C1 of BR.  No fevers/chills/night sweats.   MEDICAL HISTORY:  Past Medical History:  Diagnosis Date   BPH (benign prostatic hypertrophy)    CAD (coronary artery disease) 01/2010   S/P drug eluting stents LAD and PL branch of [facility] Dr. 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   follicular lymphoma dx'd 06/2017   Hernia    HLD (hyperlipidemia)    HTN (hypertension)     SURGICAL HISTORY: Past Surgical History:  Procedure Laterality Date   BACK SURGERY     CORONARY ANGIOGRAPHY N/A 01/29/2023   Procedure: CORONARY ANGIOGRAPHY;  Surgeon: Verlin Lonni BIRCH, MD;  Location: [facility];  Service: Cardiovascular;  Laterality: N/A;   CORONARY BALLOON ANGIOPLASTY N/A 01/29/2023   Procedure: CORONARY BALLOON ANGIOPLASTY;  Surgeon: Verlin Lonni BIRCH, MD;  Location: [facility];   Service: Cardiovascular;  Laterality: N/A;   CORONARY LITHOTRIPSY N/A 01/29/2023   Procedure: CORONARY LITHOTRIPSY;  Surgeon: Verlin Lonni BIRCH, MD;  Location: [facility];  Service: Cardiovascular;  Laterality: N/A;   CYSTOSCOPY WITH LITHOLAPAXY N/A 05/20/2019   Procedure: CYSTOSCOPY WITH LITHOLAPAXY;  Surgeon: Cam Morene ORN, MD;  Location: [facility];  Service: Urology;  Laterality: N/A;   CYSTOSCOPY/URETEROSCOPY/HOLMIUM LASER/STENT PLACEMENT Left 05/20/2019   Procedure: CYSTOSCOPY/URETEROSCOPY/HOLMIUM LASER/STENT PLACEMENT;  Surgeon: Cam Morene ORN, MD;  Location: [facility];  Service: Urology;  Laterality: Left;   HERNIA REPAIR  01/08/11   RIH   MASS EXCISION Right 08/05/2017   Procedure: EXCISION RIGHT POSTERIOR  NECK LYMPH NODE;  Surgeon: Ethyl Lonni BRAVO, MD;  Location: [facility];  Service: ENT;  Laterality: Right;    SOCIAL HISTORY: Social History   Socioeconomic History   Marital status: Married    Spouse name: Not on file   Number of children: Not on file   Years of education: Not on file   Highest education level: Not on file  Occupational History   Not on file  Tobacco Use   Smoking status: Never   Smokeless tobacco: Never  Vaping Use   Vaping status: Never Used  Substance and Sexual Activity   Alcohol use: No   Drug use: No   Sexual activity: Not on file  Other Topics Concern   Not on file  Social History Narrative   Married   Social Drivers of Health   Financial Resource Strain: Low Risk  (11/17/2022)   Received from [facility]   Overall Financial  Resource Strain (CARDIA)    Difficulty of Paying Living Expenses: Not very hard  Food Insecurity: No Food Insecurity (06/04/2023)   Received from [facility]   Hunger Vital Sign    Within the past 12 months, you worried that your food would run out before you got the money to buy more.: Never true    Within the past 12 months, the food you bought just didn't last and you  didn't have money to get more.: Never true  Transportation Needs: No Transportation Needs (06/04/2023)   Received from [facility]   PRAPARE - Transportation    Lack of Transportation (Medical): No    Lack of Transportation (Non-Medical): No  Physical Activity: Inactive (11/17/2022)   Received from [facility]   Exercise Vital Sign    On average, how many days per week do you engage in moderate to strenuous exercise (like a brisk walk)?: 0 days    On average, how many minutes do you engage in exercise at this level?: 0 min  Stress: Stress Concern Present (11/17/2022)   Received from [facility] - Occupational Stress Questionnaire    Feeling of Stress : Rather much  Social Connections: Moderately Isolated (11/17/2022)   Received from [facility]   Social Connection and Isolation Panel    In a typical week, how many times do you talk on the phone with family, friends, or neighbors?: More than three times a week    How often do you get together with friends or relatives?: More than three times a week    How often do you attend church or religious services?: More than 4 times per year    Do you belong to any clubs or organizations such as church groups, unions, fraternal or athletic groups, or school groups?: No    How often do you attend meetings of the clubs or organizations you belong to?: Never    Are you married, widowed, divorced, separated, never married, or living with a partner?: Widowed  Intimate Partner Violence: Not At Risk (06/04/2023)   Received from [facility]   Humiliation, Afraid, Rape, and Kick questionnaire    Within the last year, have you been afraid of your partner or ex-partner?: No    Within the last year, have you been humiliated or emotionally abused in other ways by your partner or ex-partner?: No    Within the last year, have you been kicked, hit, slapped, or otherwise physically hurt by your partner or  ex-partner?: No    Within the last year, have you been raped or forced to have any kind of sexual activity by your partner or ex-partner?: No    FAMILY HISTORY: Family History  Problem Relation Age of Onset   Heart failure Mother     ALLERGIES:  has no known allergies.  MEDICATIONS:  Current Outpatient Medications  Medication Sig Dispense Refill   acyclovir  (ZOVIRAX ) 400 MG tablet Take 1 tablet (400 mg total) by mouth daily. 30 tablet 3   allopurinol  (ZYLOPRIM ) 100 MG tablet Take 1 tablet (100 mg total) by mouth 2 (two) times daily. 60 tablet 0   apixaban  (ELIQUIS ) 5 MG TABS tablet Take 2.5 mg by mouth 2 (two) times daily.     atenolol  (TENORMIN ) 25 MG tablet Take 1 tablet (25 mg total) by mouth daily. 90 tablet 3   brimonidine  (ALPHAGAN ) 0.2 % ophthalmic solution Place 1 drop into the right  eye 2 (two) times daily.     cholecalciferol (VITAMIN D) 1000 UNITS tablet Take 1,000 Units by mouth daily.     Cyanocobalamin (B-12) 2500 MCG TABS Take 2,500 mcg by mouth daily.     dexamethasone  (DECADRON ) 4 MG tablet Take 2 tablets (8 mg total) by mouth daily. Start the day after bendamustine  chemotherapy for 2 days. Take with food. 30 tablet 1   finasteride  (PROSCAR ) 5 MG tablet Take 5 mg by mouth daily.     furosemide  (LASIX ) 20 MG tablet TAKE 1 TABLET BY MOUTH TWICE  DAILY 180 tablet 3   gabapentin (NEURONTIN) 100 MG capsule Take 1 capsule by mouth 2 (two) times daily as needed (for knee pain).     nitroGLYCERIN  (NITROSTAT ) 0.4 MG SL tablet Place 1 tablet (0.4 mg total) under the tongue every 5 (five) minutes as needed for chest pain. 25 tablet 6   ondansetron  (ZOFRAN ) 8 MG tablet Take 1 tablet (8 mg total) by mouth every 8 (eight) hours as needed for nausea or vomiting. Start on the third day after chemotherapy. 30 tablet 1   potassium chloride  SA (KLOR-CON  M) 20 MEQ tablet Take 1 tablet (20 mEq total) by mouth daily. Please keep upcoming appointment for future refills. Thank you.      pravastatin  (PRAVACHOL ) 40 MG tablet Take 1 tablet (40 mg total) by mouth at bedtime. 90 tablet 3   prochlorperazine  (COMPAZINE ) 10 MG tablet Take 1 tablet (10 mg total) by mouth every 6 (six) hours as needed for nausea or vomiting. 30 tablet 1   sucralfate (CARAFATE) 1 g tablet Take 1 g by mouth 4 (four) times daily -  with meals and at bedtime.     ticagrelor  (BRILINTA ) 90 MG TABS tablet Take 1 tablet (90 mg total) by mouth 2 (two) times daily. 180 tablet 1   No current facility-administered medications for this visit.    REVIEW OF SYSTEMS:    10 Point review of Systems was done is negative except as noted above.   PHYSICAL EXAMINATION: ECOG FS:2 - Symptomatic, <50% confined to bed  Vitals:   09/29/23 1154  BP: (!) 116/50  Pulse: 83  Resp: 18  Temp: (!) 97.5 F (36.4 C)  SpO2: 100%      Wt Readings from Last 3 Encounters:  09/29/23 153 lb 4.8 oz (69.5 kg)  09/04/23 159 lb 6.4 oz (72.3 kg)  08/20/23 164 lb 1.6 oz (74.4 kg)   There is no height or weight on file to calculate BMI.     GENERAL:alert, in no acute distress and comfortable SKIN: no acute rashes, no significant lesions EYES: conjunctiva are pink and non-injected, sclera anicteric OROPHARYNX: MMM, no exudates, no oropharyngeal erythema or ulceration NECK: supple, no JVD LYMPH:  no palpable lymphadenopathy in the cervical, axillary or inguinal regions LUNGS: clear to auscultation b/l with normal respiratory effort HEART: regular rate & rhythm ABDOMEN:  normoactive bowel sounds , non tender, not distended. Extremity: no pedal edema PSYCH: alert & oriented x 3 with fluent speech NEURO: no focal motor/sensory deficits   LABORATORY DATA:  I have reviewed the data as listed     Latest Ref Rng & Units 09/29/2023   11:20 AM 09/15/2023    9:31 AM 09/04/2023   10:00 AM  CBC  WBC 4.0 - 10.5 K/uL 4.9  10.1  6.6   Hemoglobin 13.0 - 17.0 g/dL 88.7  87.6  88.6   Hematocrit 39.0 - 52.0 % 34.0  38.6  35.0  Platelets  150 - 400 K/uL 146  226  234        Latest Ref Rng & Units 09/29/2023   11:20 AM 09/15/2023    9:31 AM 09/04/2023   10:00 AM  CMP  Glucose 70 - 99 mg/dL 88  96  94   BUN 8 - 23 mg/dL 18  29  28    Creatinine 0.61 - 1.24 mg/dL 9.03  8.86  8.83   Sodium 135 - 145 mmol/L 140  145  146   Potassium 3.5 - 5.1 mmol/L 4.0  3.3  3.6   Chloride 98 - 111 mmol/L 103  104  105   CO2 22 - 32 mmol/L 32  32  33   Calcium 8.9 - 10.3 mg/dL 8.9  9.7  9.6   Total Protein 6.5 - 8.1 g/dL 5.9  6.9  6.8   Total Bilirubin 0.0 - 1.2 mg/dL 0.7  0.8  0.9   Alkaline Phos 38 - 126 U/L 51  61  64   AST 15 - 41 U/L 22  14  17    ALT 0 - 44 U/L 16  12  10     Lab Results  Component Value Date   LDH 193 (H) 09/29/2023    08/27/2023 Surgical Pathology:      08/27/2023 Surgical Pathology:        02/18/2019 CT Abdomen Pelvis W Contrast (Accession [id number]):   02/18/2019 CT Chest W Contrast (Accession [id number]):  08/08/17 Pathology:   08/05/17 Pathology:     RADIOGRAPHIC STUDIES: I have personally reviewed the radiological images as listed and agreed with the findings in the report. No results found.    ASSESSMENT & PLAN:   89 y.o. 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 No abdominal or pelvic involvement.    03/20/18 CT C/A/P revealed Progressive bilateral axillary and right inguinal lymphadenopathy, including necrotic lymph nodes in the right axilla, as above. Spleen is normal in size. 4 mm distal left ureteral calculus, unchanged. No hydronephrosis. Stable layering bladder calculi measuring up to 5 mm. Cholelithiasis, without associated inflammatory changes. Stable sequela of prior/chronic  pancreatitis.   11/03/2018 CT neck revealed Marked interval progression of cervical lymphadenopathy since PET-CT 09/02/2017, now with lymphadenopathy present bilaterally at essentially all stations. Index nodes as described. This includes bilateral intraparotid lymphadenopathy. Bulky right-sided adenopathy results in multifocal narrowing of the right internal jugular vein. Significant narrowing of the mid to lower left internal jugular vein of unclear cause as there is no significant mass effect at this level.  11/03/2018 CT CAP revealed 1. Progression of bilateral axillary, retroperitoneal, pelvic, and inguinal adenopathy. New anterior and posterior lower cervical adenopathy identified within the neck. 2. Persistent distal left ureteral calculus. Small bladder calculi are unchanged. 3. Aortic Atherosclerosis (ICD10-I70.0). Coronary artery calcifications. 4. Sequelae of chronic pancreatitis noted.  10/01/2019 CT Abd/Pel ([id number]) revealed no evidence of mass in right kidney, new and enlarged abdominal and pelvic lymph nodes.   2. 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 3. H/o head and neck Basal cell carcinoma Continue routine skin checks with dermatologist    4. Renal indeterminate lesion -06/28/19 Renal US  ([id number]) revealed New right-sided mass at the hilum which measures 1.5cm in size.   -resolved on subsequent  scans   PLAN:  -Discussed lab results from today, 09/29/2023, in detail with patient -cbc and cmp stable LDH 193 Hep B core antibody false positive liekly from abnormal Antibody production with lymphoma No notably toxicities with 1st cycle of BR with Bendamustine  dose reduction @ 70mg /m2 Will prescribe Keflex  for possible facila cellultis. -advised patient to avoid aggressive shaving over the face to avoid skin irritation.  FOLLOW-UP:  F/u as per scheduled appointments on 10/13/2023 for C2 of BR as scheduled  The total time spent in the appointment was 30 minutes* .  All  of the patient's questions were answered with apparent satisfaction. The patient knows to call the clinic with any problems, questions or concerns.   Emaline Saran MD MS AAHIVMS [facility] Hematology/Oncology Physician [facility]  .*Total Encounter Time as defined by the Centers for Medicare and Medicaid Services includes, in addition to the face-to-face time of a patient visit (documented in the note above) non-face-to-face time: obtaining and reviewing outside history, ordering and reviewing medications, tests or procedures, care coordination (communications with other health care professionals or caregivers) and documentation in the medical record.    I,Mitra Faeizi,acting as a Neurosurgeon for Emaline Saran, MD.,have documented all relevant documentation on the behalf of Emaline Saran, MD,as directed by  Emaline Saran, MD while in the presence of Emaline Saran, MD.   .I have reviewed the above documentation for accuracy and completeness, and I agree with the above. .Ryver Poblete Kishore Nghia Mcentee MD

## 2023-09-29 ENCOUNTER — Inpatient Hospital Stay

## 2023-09-29 ENCOUNTER — Telehealth: Payer: Self-pay | Admitting: Cardiovascular Disease

## 2023-09-29 ENCOUNTER — Inpatient Hospital Stay: Admitting: Hematology

## 2023-09-29 VITALS — BP 116/50 | HR 83 | Temp 97.5°F | Resp 18 | Wt 153.3 lb

## 2023-09-29 DIAGNOSIS — C8218 Follicular lymphoma grade II, lymph nodes of multiple sites: Secondary | ICD-10-CM | POA: Diagnosis not present

## 2023-09-29 DIAGNOSIS — I2510 Atherosclerotic heart disease of native coronary artery without angina pectoris: Secondary | ICD-10-CM

## 2023-09-29 DIAGNOSIS — Z5111 Encounter for antineoplastic chemotherapy: Secondary | ICD-10-CM | POA: Diagnosis not present

## 2023-09-29 LAB — CBC WITH DIFFERENTIAL (CANCER CENTER ONLY)
Abs Immature Granulocytes: 0.02 K/uL (ref 0.00–0.07)
Basophils Absolute: 0 K/uL (ref 0.0–0.1)
Basophils Relative: 0 %
Eosinophils Absolute: 0.1 K/uL (ref 0.0–0.5)
Eosinophils Relative: 2 %
HCT: 34 % — ABNORMAL LOW (ref 39.0–52.0)
Hemoglobin: 11.2 g/dL — ABNORMAL LOW (ref 13.0–17.0)
Immature Granulocytes: 0 %
Lymphocytes Relative: 6 %
Lymphs Abs: 0.3 K/uL — ABNORMAL LOW (ref 0.7–4.0)
MCH: 28.1 pg (ref 26.0–34.0)
MCHC: 32.9 g/dL (ref 30.0–36.0)
MCV: 85.2 fL (ref 80.0–100.0)
Monocytes Absolute: 0.6 K/uL (ref 0.1–1.0)
Monocytes Relative: 12 %
Neutro Abs: 3.9 K/uL (ref 1.7–7.7)
Neutrophils Relative %: 80 %
Platelet Count: 146 K/uL — ABNORMAL LOW (ref 150–400)
RBC: 3.99 MIL/uL — ABNORMAL LOW (ref 4.22–5.81)
RDW: 15.2 % (ref 11.5–15.5)
WBC Count: 4.9 K/uL (ref 4.0–10.5)
nRBC: 0 % (ref 0.0–0.2)

## 2023-09-29 LAB — CMP (CANCER CENTER ONLY)
ALT: 16 U/L (ref 0–44)
AST: 22 U/L (ref 15–41)
Albumin: 3.6 g/dL (ref 3.5–5.0)
Alkaline Phosphatase: 51 U/L (ref 38–126)
Anion gap: 5 (ref 5–15)
BUN: 18 mg/dL (ref 8–23)
CO2: 32 mmol/L (ref 22–32)
Calcium: 8.9 mg/dL (ref 8.9–10.3)
Chloride: 103 mmol/L (ref 98–111)
Creatinine: 0.96 mg/dL (ref 0.61–1.24)
GFR, Estimated: >60 mL/min (ref >60)
Glucose, Bld: 88 mg/dL (ref 70–99)
Potassium: 4 mmol/L (ref 3.5–5.1)
Sodium: 140 mmol/L (ref 135–145)
Total Bilirubin: 0.7 mg/dL (ref 0.0–1.2)
Total Protein: 5.9 g/dL — ABNORMAL LOW (ref 6.5–8.1)

## 2023-09-29 LAB — LACTATE DEHYDROGENASE: LDH: 193 U/L — ABNORMAL HIGH (ref 98–192)

## 2023-09-29 MED ORDER — NITROGLYCERIN 0.4 MG SL SUBL: 0.4000 mg | SUBLINGUAL_TABLET | SUBLINGUAL | 5 refills | Status: DC | PRN

## 2023-09-29 MED ORDER — CEPHALEXIN 500 MG PO CAPS: 500.0000 mg | ORAL_CAPSULE | Freq: Two times a day (BID) | ORAL | 0 refills | Status: AC

## 2023-09-29 NOTE — Telephone Encounter (Signed)
 Pt's medication was sent to pt's pharmacy as requested. Confirmation received.

## 2023-09-29 NOTE — Telephone Encounter (Signed)
*  STAT* If patient is at the pharmacy, call can be transferred to refill team.   1. Which medications need to be refilled? (please list name of each medication and dose if known)  nitroGLYCERIN  (NITROSTAT ) 0.4 MG SL tablet   2. Which pharmacy/location (including street and city if local pharmacy) is medication to be sent to? [facility] [address] [place], [place] - [address] [address]   3. Do they need a 30 day or 90 day supply?  Standard supply. Daughter most recent prescription expired.

## 2023-09-30 ENCOUNTER — Other Ambulatory Visit: Payer: Self-pay | Admitting: Cardiovascular Disease

## 2023-10-05 ENCOUNTER — Encounter: Payer: Self-pay | Admitting: Hematology

## 2023-10-10 NOTE — Progress Notes (Signed)
 Rapid Infusion Rituximab  Pharmacist Evaluation  Harold [name] is a 89 y.o. male being treated with rituximab  for follicular lymphoma. This patient may be considered for RIR.   A pharmacist has verified the patient tolerated rituximab  infusions per the [facility] standard infusion protocol without grade 3-4 infusion reactions. The treatment plan will be updated to reflect RIR if the patient qualifies per the checklist below:   Age > 46 years old Yes   Clinically significant cardiovascular disease No   Circulating lymphocyte count < 5000/uL prior to cycle two Yes  Lab Results  Component Value Date   LYMPHSABS 0.3 (L) 09/29/2023    Prior documented grade 3-4 infusion reaction to rituximab  No   Prior documented grade 1-2 infusion reaction to rituximab  (If YES, Pharmacist will confirm with Physician if patient is still a candidate for RIR) No   Previous rituximab  infusion within the past 6 months Yes   Treatment Plan updated orders to reflect RIR Yes    Harold [name] does meet the criteria for Rapid Infusion Rituximab . This patient is going to be switched to rapid infusion rituximab .   Emie Sommerfeld, PharmD, MBA

## 2023-10-12 NOTE — Progress Notes (Signed)
 HEMATOLOGY/ONCOLOGY CLINIC NOTE  Date of Service: 10/13/2023  Patient Care Team: [name], MD as PCP - General (Family Medicine) Verlin Lonni BIRCH, MD as PCP - Cardiology (Cardiology)  CHIEF COMPLAINTS/PURPOSE OF CONSULTATION:  Follow-up for continued evaluation and management of low-grade follicular lymphoma   HISTORY OF PRESENTING ILLNESS:  please see previous notes for details on initial presentation.  INTERVAL HISTORY:   Harold [name] is a 89 y.o. male here for continued evaluation and management of his low-grade follicular lymphoma.   Patient was last seen by me on 09/29/2023 and was noted to have skin erythema and was prescribed antibiotics.   Patient is here for C2 of BR. Most of his LNadenopathy has resolved but the rt upper neck/jaw mass initially shrunk and then has rapidly increased in size and is causing discomfort as well as difficulty with eating and speaking. We discussed the concerns for large cell transformation of his follicular lymphoma. Previously LN biopsy of the mass was requested and instead of that IR biopsied any alternative LN which showed just low grade follicular Lymphoma.   MEDICAL HISTORY:  Past Medical History:  Diagnosis Date   BPH (benign prostatic hypertrophy)    CAD (coronary artery disease) 01/2010   S/P drug eluting stents LAD and PL branch of [facility] Dr. 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   follicular lymphoma dx'd 06/2017   Hernia    HLD (hyperlipidemia)    HTN (hypertension)     SURGICAL HISTORY: Past Surgical History:  Procedure Laterality Date   BACK SURGERY     CORONARY ANGIOGRAPHY N/A 01/29/2023   Procedure: CORONARY ANGIOGRAPHY;  Surgeon: Verlin Lonni BIRCH, MD;  Location: [facility];  Service: Cardiovascular;  Laterality: N/A;   CORONARY BALLOON ANGIOPLASTY N/A 01/29/2023   Procedure: CORONARY BALLOON ANGIOPLASTY;  Surgeon: Verlin Lonni BIRCH, MD;   Location: [facility];  Service: Cardiovascular;  Laterality: N/A;   CORONARY LITHOTRIPSY N/A 01/29/2023   Procedure: CORONARY LITHOTRIPSY;  Surgeon: Verlin Lonni BIRCH, MD;  Location: [facility];  Service: Cardiovascular;  Laterality: N/A;   CYSTOSCOPY WITH LITHOLAPAXY N/A 05/20/2019   Procedure: CYSTOSCOPY WITH LITHOLAPAXY;  Surgeon: Cam Morene ORN, MD;  Location: [facility];  Service: Urology;  Laterality: N/A;   CYSTOSCOPY/URETEROSCOPY/HOLMIUM LASER/STENT PLACEMENT Left 05/20/2019   Procedure: CYSTOSCOPY/URETEROSCOPY/HOLMIUM LASER/STENT PLACEMENT;  Surgeon: Cam Morene ORN, MD;  Location: [facility];  Service: Urology;  Laterality: Left;   HERNIA REPAIR  01/08/11   RIH   MASS EXCISION Right 08/05/2017   Procedure: EXCISION RIGHT POSTERIOR  NECK LYMPH NODE;  Surgeon: Ethyl Lonni BRAVO, MD;  Location:  SURGERY CENTER;  Service: ENT;  Laterality: Right;    SOCIAL HISTORY: Social History   Socioeconomic History   Marital status: Married    Spouse name: Not on file   Number of children: Not on file   Years of education: Not on file   Highest education level: Not on file  Occupational History   Not on file  Tobacco Use   Smoking status: Never   Smokeless tobacco: Never  Vaping Use   Vaping status: Never Used  Substance and Sexual Activity   Alcohol use: No   Drug use: No   Sexual activity: Not on file  Other Topics Concern   Not on file  Social History Narrative   Married   Social Drivers of Health   Financial Resource Strain: Low Risk  (  11/17/2022)   Received from [facility]   Overall Financial Resource Strain (CARDIA)    Difficulty of Paying Living Expenses: Not very hard  Food Insecurity: No Food Insecurity (06/04/2023)   Received from [facility]   Hunger Vital Sign    Within the past 12 months, you worried that your food would run out before you got the money to buy more.: Never true    Within the past 12 months, the food you  bought just didn't last and you didn't have money to get more.: Never true  Transportation Needs: No Transportation Needs (06/04/2023)   Received from [facility] - Transportation    Lack of Transportation (Medical): No    Lack of Transportation (Non-Medical): No  Physical Activity: Inactive (11/17/2022)   Received from [facility]   Exercise Vital Sign    On average, how many days per week do you engage in moderate to strenuous exercise (like a brisk walk)?: 0 days    On average, how many minutes do you engage in exercise at this level?: 0 min  Stress: Stress Concern Present (11/17/2022)   Received from [facility] - Occupational Stress Questionnaire    Feeling of Stress : Rather much  Social Connections: Moderately Isolated (11/17/2022)   Received from [facility]   Social Connection and Isolation Panel    In a typical week, how many times do you talk on the phone with family, friends, or neighbors?: More than three times a week    How often do you get together with friends or relatives?: More than three times a week    How often do you attend church or religious services?: More than 4 times per year    Do you belong to any clubs or organizations such as church groups, unions, fraternal or athletic groups, or school groups?: No    How often do you attend meetings of the clubs or organizations you belong to?: Never    Are you married, widowed, divorced, separated, never married, or living with a partner?: Widowed  Intimate Partner Violence: Not At Risk (06/04/2023)   Received from [facility]   Humiliation, Afraid, Rape, and Kick questionnaire    Within the last year, have you been afraid of your partner or ex-partner?: No    Within the last year, have you been humiliated or emotionally abused in other ways by your partner or ex-partner?: No    Within the last year, have you been kicked, hit, slapped, or otherwise  physically hurt by your partner or ex-partner?: No    Within the last year, have you been raped or forced to have any kind of sexual activity by your partner or ex-partner?: No    FAMILY HISTORY: Family History  Problem Relation Age of Onset   Heart failure Mother     ALLERGIES:  has no known allergies.  MEDICATIONS:  Current Outpatient Medications  Medication Sig Dispense Refill   acyclovir  (ZOVIRAX ) 400 MG tablet Take 1 tablet (400 mg total) by mouth daily. 30 tablet 3   allopurinol  (ZYLOPRIM ) 100 MG tablet Take 1 tablet (100 mg total) by mouth 2 (two) times daily. 60 tablet 0   apixaban  (ELIQUIS ) 5 MG TABS tablet Take 2.5 mg by mouth 2 (two) times daily.     atenolol  (TENORMIN ) 25 MG tablet TAKE 1 TABLET BY MOUTH DAILY 100 tablet 1   brimonidine  (ALPHAGAN ) 0.2 %  ophthalmic solution Place 1 drop into the right eye 2 (two) times daily.     cholecalciferol  (VITAMIN D ) 1000 UNITS tablet Take 1,000 Units by mouth daily.     Cyanocobalamin  (B-12) 2500 MCG TABS Take 2,500 mcg by mouth daily.     dexamethasone  (DECADRON ) 4 MG tablet Take 2 tablets (8 mg total) by mouth daily. Start the day after bendamustine  chemotherapy for 2 days. Take with food. 30 tablet 1   finasteride  (PROSCAR ) 5 MG tablet Take 5 mg by mouth daily.     furosemide  (LASIX ) 20 MG tablet TAKE 1 TABLET BY MOUTH TWICE  DAILY 180 tablet 3   gabapentin (NEURONTIN) 100 MG capsule Take 1 capsule by mouth 2 (two) times daily as needed (for knee pain).     nitroGLYCERIN  (NITROSTAT ) 0.4 MG SL tablet Place 1 tablet (0.4 mg total) under the tongue every 5 (five) minutes as needed for chest pain. 25 tablet 5   ondansetron  (ZOFRAN ) 8 MG tablet Take 1 tablet (8 mg total) by mouth every 8 (eight) hours as needed for nausea or vomiting. Start on the third day after chemotherapy. 30 tablet 1   potassium chloride  SA (KLOR-CON  M) 20 MEQ tablet Take 1 tablet (20 mEq total) by mouth daily. Please keep upcoming appointment for future refills.  Thank you.     pravastatin  (PRAVACHOL ) 40 MG tablet Take 1 tablet (40 mg total) by mouth at bedtime. 90 tablet 3   prochlorperazine  (COMPAZINE ) 10 MG tablet Take 1 tablet (10 mg total) by mouth every 6 (six) hours as needed for nausea or vomiting. 30 tablet 1   sucralfate (CARAFATE) 1 g tablet Take 1 g by mouth 4 (four) times daily -  with meals and at bedtime.     ticagrelor  (BRILINTA ) 90 MG TABS tablet Take 1 tablet (90 mg total) by mouth 2 (two) times daily. 180 tablet 1   No current facility-administered medications for this visit.    REVIEW OF SYSTEMS:    10 Point review of Systems was done is negative except as noted above.   PHYSICAL EXAMINATION: ECOG FS:2 - Symptomatic, <50% confined to bed  Vitals:   10/13/23 1242  BP: (!) 116/55  Pulse: 81  Resp: 18  Temp: 98.1 F (36.7 C)  SpO2: 99%   Wt Readings from Last 3 Encounters:  09/29/23 153 lb 4.8 oz (69.5 kg)  09/04/23 159 lb 6.4 oz (72.3 kg)  08/20/23 164 lb 1.6 oz (74.4 kg)   Body mass index is 22.27 kg/m.    GENERAL:alert, some distress due to large rt upper neck/jaw mass SKIN: no acute rashes, no significant lesions EYES: conjunctiva are pink and non-injected, sclera anicteric OROPHARYNX: MMM, no exudates, no oropharyngeal erythema or ulceration NECK: supple, no JVD LYMPH: rapidly enlarging rt upper neck mass. Other cervical/axillary and inguinal LNadenopathy has significantly resolved. LUNGS: clear to auscultation b/l with normal respiratory effort HEART: regular rate & rhythm ABDOMEN:  normoactive bowel sounds , non tender, not distended. Extremity: no pedal edema PSYCH: alert & oriented x 3 with fluent speech NEURO: no focal motor/sensory deficits   LABORATORY DATA:  I have reviewed the data as listed     Latest Ref Rng & Units 10/13/2023   12:12 PM 09/29/2023   11:20 AM 09/15/2023    9:31 AM  CBC  WBC 4.0 - 10.5 K/uL 5.0  4.9  10.1   Hemoglobin 13.0 - 17.0 g/dL 88.9  88.7  87.6   Hematocrit 39.0 -  52.0 %  34.2  34.0  38.6   Platelets 150 - 400 K/uL 269  146  226        Latest Ref Rng & Units 10/13/2023   12:12 PM 09/29/2023   11:20 AM 09/15/2023    9:31 AM  CMP  Glucose 70 - 99 mg/dL 85  88  96   BUN 8 - 23 mg/dL 22  18  29    Creatinine 0.61 - 1.24 mg/dL 8.99  9.03  8.86   Sodium 135 - 145 mmol/L 143  140  145   Potassium 3.5 - 5.1 mmol/L 3.6  4.0  3.3   Chloride 98 - 111 mmol/L 103  103  104   CO2 22 - 32 mmol/L 35  32  32   Calcium 8.9 - 10.3 mg/dL 8.9  8.9  9.7   Total Protein 6.5 - 8.1 g/dL 6.1  5.9  6.9   Total Bilirubin 0.0 - 1.2 mg/dL 0.5  0.7  0.8   Alkaline Phos 38 - 126 U/L 53  51  61   AST 15 - 41 U/L 15  22  14    ALT 0 - 44 U/L 10  16  12     Lab Results  Component Value Date   LDH 193 (H) 09/29/2023    08/27/2023 Surgical Pathology:      08/27/2023 Surgical Pathology:        02/18/2019 CT Abdomen Pelvis W Contrast (Accession [id number]):   02/18/2019 CT Chest W Contrast (Accession [id number]):  08/08/17 Pathology:   08/05/17 Pathology:     RADIOGRAPHIC STUDIES: I have personally reviewed the radiological images as listed and agreed with the findings in the report. No results found.    ASSESSMENT & PLAN:   89 y.o. 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 No abdominal or pelvic involvement.    03/20/18 CT C/A/P revealed Progressive bilateral axillary and right inguinal lymphadenopathy, including necrotic lymph nodes in the right axilla, as above. Spleen is normal in size. 4 mm distal left ureteral calculus, unchanged. No hydronephrosis. Stable layering bladder calculi measuring up to 5 mm. Cholelithiasis, without associated inflammatory changes.  Stable sequela of prior/chronic pancreatitis.   11/03/2018 CT neck revealed Marked interval progression of cervical lymphadenopathy since PET-CT 09/02/2017, now with lymphadenopathy present bilaterally at essentially all stations. Index nodes as described. This includes bilateral intraparotid lymphadenopathy. Bulky right-sided adenopathy results in multifocal narrowing of the right internal jugular vein. Significant narrowing of the mid to lower left internal jugular vein of unclear cause as there is no significant mass effect at this level.  11/03/2018 CT CAP revealed 1. Progression of bilateral axillary, retroperitoneal, pelvic, and inguinal adenopathy. New anterior and posterior lower cervical adenopathy identified within the neck. 2. Persistent distal left ureteral calculus. Small bladder calculi are unchanged. 3. Aortic Atherosclerosis (ICD10-I70.0). Coronary artery calcifications. 4. Sequelae of chronic pancreatitis noted.  10/01/2019 CT Abd/Pel ([id number]) revealed no evidence of mass in right kidney, new and enlarged abdominal and pelvic lymph nodes.   2. 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 3. H/o head and neck Basal cell carcinoma Continue routine skin checks with dermatologist    4. Renal indeterminate lesion -06/28/19 Renal US  ([id number]) revealed New right-sided mass at the hilum which measures 1.5cm in  size.   -resolved on subsequent scans   PLAN:  -Discussed lab results from today, 10/13/2023, in detail with patient -Discussed with patient and his daughter-in-law that the rapidly enlarging right upper neck mass is concerning for large cell transformation of his follicular lymphoma and is unlikely to be controlled by his Bendamustine  Rituxan  systemic treatment. - This mass is starting to affect his eating and swallowing as well as speaking and is uncomfortable. - We discussed and patient is agreeable to be admitted for expedited workup and treatment. - His case was discussed by  me with Dr. Sonjia hospital medicine and the patient has been accepted for hospitalization. - Would recommend urgent ENT evaluation for incisional biopsy of the right neck mass.  A needle biopsy could miss the large cell transformation in the background of follicular lymphoma. - If ENT cannot perform incisional biopsy then would recommend cold needle biopsies of the right neck/jaw mass. - Would also recommend CT of the neck and face to evaluate this mass further. - Echocardiogram to evaluate cardiac function since we will likely have to consider switching to R mini CHOP for likely not cell transformation. - Pain management as needed. - Patient to be admitted to hospitalist service. - Will likely need Port-A-Cath placement for treatment. - Oncology will continue to follow.  FOLLOW-UP:  Admit to hospital medicine  The total time spent in the appointment was 40 minutes* .  All of the patient's questions were answered with apparent satisfaction. The patient knows to call the clinic with any problems, questions or concerns.   Emaline Saran MD MS AAHIVMS [facility] Hematology/Oncology Physician [facility]  .*Total Encounter Time as defined by the Centers for Medicare and Medicaid Services includes, in addition to the face-to-face time of a patient visit (documented in the note above) non-face-to-face time: obtaining and reviewing outside history, ordering and reviewing medications, tests or procedures, care coordination (communications with other health care professionals or caregivers) and documentation in the medical record.    I,Mitra Faeizi,acting as a Neurosurgeon for Emaline Saran, MD.,have documented all relevant documentation on the behalf of Emaline Saran, MD,as directed by  Emaline Saran, MD while in the presence of Emaline Saran, MD.  .I have reviewed the above documentation for accuracy and completeness, and I agree with the above. .Vinaya Sancho Kishore Mailynn Everly MD

## 2023-10-13 ENCOUNTER — Encounter (HOSPITAL_COMMUNITY): Payer: Self-pay

## 2023-10-13 ENCOUNTER — Inpatient Hospital Stay

## 2023-10-13 ENCOUNTER — Inpatient Hospital Stay (HOSPITAL_BASED_OUTPATIENT_CLINIC_OR_DEPARTMENT_OTHER): Admitting: Hematology

## 2023-10-13 ENCOUNTER — Inpatient Hospital Stay (HOSPITAL_COMMUNITY)
Admission: AD | Admit: 2023-10-13 | Discharge: 2023-10-16 | DRG: 824 | Disposition: A | Discharge status: Home / Self Care | Attending: Internal Medicine | Admitting: Internal Medicine

## 2023-10-13 ENCOUNTER — Other Ambulatory Visit: Payer: Self-pay

## 2023-10-13 VITALS — BP 116/55 | HR 81 | Temp 98.1°F | Resp 18 | Wt 155.2 lb

## 2023-10-13 DIAGNOSIS — Z85828 Personal history of other malignant neoplasm of skin: Secondary | ICD-10-CM

## 2023-10-13 DIAGNOSIS — Z85820 Personal history of malignant melanoma of skin: Secondary | ICD-10-CM | POA: Diagnosis not present

## 2023-10-13 DIAGNOSIS — E785 Hyperlipidemia, unspecified: Secondary | ICD-10-CM | POA: Diagnosis present

## 2023-10-13 DIAGNOSIS — I110 Hypertensive heart disease with heart failure: Secondary | ICD-10-CM | POA: Diagnosis present

## 2023-10-13 DIAGNOSIS — D630 Anemia in neoplastic disease: Secondary | ICD-10-CM | POA: Diagnosis present

## 2023-10-13 DIAGNOSIS — R1310 Dysphagia, unspecified: Secondary | ICD-10-CM | POA: Diagnosis present

## 2023-10-13 DIAGNOSIS — D649 Anemia, unspecified: Secondary | ICD-10-CM | POA: Diagnosis not present

## 2023-10-13 DIAGNOSIS — C8590 Non-Hodgkin lymphoma, unspecified, unspecified site: Secondary | ICD-10-CM | POA: Diagnosis present

## 2023-10-13 DIAGNOSIS — Z79899 Other long term (current) drug therapy: Secondary | ICD-10-CM | POA: Diagnosis not present

## 2023-10-13 DIAGNOSIS — Z7901 Long term (current) use of anticoagulants: Secondary | ICD-10-CM | POA: Diagnosis not present

## 2023-10-13 DIAGNOSIS — I350 Nonrheumatic aortic (valve) stenosis: Secondary | ICD-10-CM | POA: Diagnosis present

## 2023-10-13 DIAGNOSIS — I2510 Atherosclerotic heart disease of native coronary artery without angina pectoris: Secondary | ICD-10-CM | POA: Diagnosis present

## 2023-10-13 DIAGNOSIS — N448 Other noninflammatory disorders of the testis: Secondary | ICD-10-CM

## 2023-10-13 DIAGNOSIS — Z7902 Long term (current) use of antithrombotics/antiplatelets: Secondary | ICD-10-CM

## 2023-10-13 DIAGNOSIS — Z955 Presence of coronary angioplasty implant and graft: Secondary | ICD-10-CM

## 2023-10-13 DIAGNOSIS — C8338 Diffuse large B-cell lymphoma, lymph nodes of multiple sites: Principal | ICD-10-CM | POA: Diagnosis present

## 2023-10-13 DIAGNOSIS — C8218 Follicular lymphoma grade II, lymph nodes of multiple sites: Principal | ICD-10-CM

## 2023-10-13 DIAGNOSIS — N400 Enlarged prostate without lower urinary tract symptoms: Secondary | ICD-10-CM | POA: Diagnosis present

## 2023-10-13 DIAGNOSIS — Z8249 Family history of ischemic heart disease and other diseases of the circulatory system: Secondary | ICD-10-CM

## 2023-10-13 DIAGNOSIS — I10 Essential (primary) hypertension: Secondary | ICD-10-CM | POA: Diagnosis present

## 2023-10-13 DIAGNOSIS — C8298 Follicular lymphoma, unspecified, lymph nodes of multiple sites: Secondary | ICD-10-CM | POA: Diagnosis present

## 2023-10-13 DIAGNOSIS — Z86718 Personal history of other venous thrombosis and embolism: Secondary | ICD-10-CM

## 2023-10-13 DIAGNOSIS — I5032 Chronic diastolic (congestive) heart failure: Secondary | ICD-10-CM | POA: Diagnosis present

## 2023-10-13 DIAGNOSIS — I5033 Acute on chronic diastolic (congestive) heart failure: Secondary | ICD-10-CM | POA: Diagnosis present

## 2023-10-13 DIAGNOSIS — N888 Other specified noninflammatory disorders of cervix uteri: Principal | ICD-10-CM | POA: Diagnosis present

## 2023-10-13 LAB — CMP (CANCER CENTER ONLY)
ALT: 10 U/L (ref 0–44)
AST: 15 U/L (ref 15–41)
Albumin: 3.6 g/dL (ref 3.5–5.0)
Alkaline Phosphatase: 53 U/L (ref 38–126)
Anion gap: 5 (ref 5–15)
BUN: 22 mg/dL (ref 8–23)
CO2: 35 mmol/L — ABNORMAL HIGH (ref 22–32)
Calcium: 8.9 mg/dL (ref 8.9–10.3)
Chloride: 103 mmol/L (ref 98–111)
Creatinine: 1 mg/dL (ref 0.61–1.24)
GFR, Estimated: >60 mL/min (ref >60)
Glucose, Bld: 85 mg/dL (ref 70–99)
Potassium: 3.6 mmol/L (ref 3.5–5.1)
Sodium: 143 mmol/L (ref 135–145)
Total Bilirubin: 0.5 mg/dL (ref 0.0–1.2)
Total Protein: 6.1 g/dL — ABNORMAL LOW (ref 6.5–8.1)

## 2023-10-13 LAB — CBC WITH DIFFERENTIAL (CANCER CENTER ONLY)
Abs Immature Granulocytes: 0.05 K/uL (ref 0.00–0.07)
Basophils Absolute: 0 K/uL (ref 0.0–0.1)
Basophils Relative: 1 %
Eosinophils Absolute: 0.2 K/uL (ref 0.0–0.5)
Eosinophils Relative: 4 %
HCT: 34.2 % — ABNORMAL LOW (ref 39.0–52.0)
Hemoglobin: 11 g/dL — ABNORMAL LOW (ref 13.0–17.0)
Immature Granulocytes: 1 %
Lymphocytes Relative: 22 %
Lymphs Abs: 1.1 K/uL (ref 0.7–4.0)
MCH: 27.6 pg (ref 26.0–34.0)
MCHC: 32.2 g/dL (ref 30.0–36.0)
MCV: 85.7 fL (ref 80.0–100.0)
Monocytes Absolute: 0.9 K/uL (ref 0.1–1.0)
Monocytes Relative: 17 %
Neutro Abs: 2.8 K/uL (ref 1.7–7.7)
Neutrophils Relative %: 55 %
Platelet Count: 269 K/uL (ref 150–400)
RBC: 3.99 MIL/uL — ABNORMAL LOW (ref 4.22–5.81)
RDW: 14.7 % (ref 11.5–15.5)
WBC Count: 5 K/uL (ref 4.0–10.5)
nRBC: 0 % (ref 0.0–0.2)

## 2023-10-13 MED ORDER — CHLORHEXIDINE GLUCONATE CLOTH 2 % EX PADS
6.0000 | MEDICATED_PAD | Freq: Every day | CUTANEOUS | Status: DC
  Administered 2023-10-14 – 2023-10-16 (×2): 6 via TOPICAL

## 2023-10-13 NOTE — Progress Notes (Signed)
   10/13/23 1559  TOC Brief Assessment  Insurance and Status Reviewed  Patient has primary care physician Yes Taras, Alm, MD)  Home environment has been reviewed home  Prior level of function: Independent  Prior/Current Home Services No current home services  Social Drivers of Health Review SDOH reviewed no interventions necessary  Readmission risk has been reviewed Yes  Transition of care needs no transition of care needs at this time

## 2023-10-13 NOTE — Progress Notes (Signed)
 Dr [name] is dose increasing Bendamustine  at this point to 90 mg/m2.  Auguste Tebbetts, Pharm.D., CPP 10/13/2023@1 :15 PM

## 2023-10-13 NOTE — H&P (Signed)
 History and Physical    Patient: Harold [name] DOB: 11-25-1934 DOA: 10/13/2023 DOS: the patient was seen and examined on 10/13/2023 PCP: [name], MD  Patient coming from: Home  Chief Complaint: Right cervical mandibular mass.  HPI: Harold [name] is a 89 y.o. male with medical history significant of BPH, bladder mass, cervical DDD, CAD, history of DVT no longer on Xarelto, chronic diastolic CHF, hypertension, hyperlipidemia, nephrolithiasis, history of septic shock, history of syncope, follicular lymphoma following at the cancer center with Dr. [name] who is being admitted directly for biopsy and possible treatment of right cervical/mandibular mass.  Per patient, this has grown substantially in the past 2 weeks. He denied dyspnea or dysphagia.  He also denied fever, chills, night sweats, rhinorrhea, sore throat, wheezing or hemoptysis.  No chest pain, palpitations, diaphoresis, PND, orthopnea or pitting edema of the lower extremities.  No abdominal pain, nausea, emesis, diarrhea, constipation, melena or hematochezia.  No flank pain, dysuria, frequency or hematuria.  No polyuria, polydipsia, polyphagia or blurred vision.   Lab work done earlier today: CBC showed a white count of 5.0, hemoglobin 11.0 g/dL platelets 730.  CMP shows CO2 of 35 mmol/L and total protein of 6.1 g/dL, the rest of the CMP was normal.   Review of Systems: As mentioned in the history of present illness. All other systems reviewed and are negative.  Past Medical History:  Diagnosis Date   BPH (benign prostatic hypertrophy)    CAD (coronary artery disease) 01/2010   S/P drug eluting stents LAD and PL branch of [facility] Dr. 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   follicular lymphoma dx'd 06/2017   Hernia    HLD (hyperlipidemia)    HTN (hypertension)    Past Surgical History:  Procedure Laterality Date   BACK SURGERY     CORONARY ANGIOGRAPHY N/A 01/29/2023    Procedure: CORONARY ANGIOGRAPHY;  Surgeon: Verlin Lonni BIRCH, MD;  Location: [facility];  Service: Cardiovascular;  Laterality: N/A;   CORONARY BALLOON ANGIOPLASTY N/A 01/29/2023   Procedure: CORONARY BALLOON ANGIOPLASTY;  Surgeon: Verlin Lonni BIRCH, MD;  Location: [facility];  Service: Cardiovascular;  Laterality: N/A;   CORONARY LITHOTRIPSY N/A 01/29/2023   Procedure: CORONARY LITHOTRIPSY;  Surgeon: Verlin Lonni BIRCH, MD;  Location: [facility];  Service: Cardiovascular;  Laterality: N/A;   CYSTOSCOPY WITH LITHOLAPAXY N/A 05/20/2019   Procedure: CYSTOSCOPY WITH LITHOLAPAXY;  Surgeon: Cam Morene ORN, MD;  Location: [facility];  Service: Urology;  Laterality: N/A;   CYSTOSCOPY/URETEROSCOPY/HOLMIUM LASER/STENT PLACEMENT Left 05/20/2019   Procedure: CYSTOSCOPY/URETEROSCOPY/HOLMIUM LASER/STENT PLACEMENT;  Surgeon: Cam Morene ORN, MD;  Location: [facility];  Service: Urology;  Laterality: Left;   HERNIA REPAIR  01/08/11   RIH   MASS EXCISION Right 08/05/2017   Procedure: EXCISION RIGHT POSTERIOR  NECK LYMPH NODE;  Surgeon: Ethyl Lonni BRAVO, MD;  Location: [facility];  Service: ENT;  Laterality: Right;   Social History:  reports that he has never smoked. He has never used smokeless tobacco. He reports that he does not drink alcohol and does not use drugs.  No Known Allergies  Family History  Problem Relation Age of Onset   Heart failure Mother     Prior to Admission medications   Medication Sig Start Date End Date Taking? Authorizing Provider  acyclovir  (ZOVIRAX ) 400 MG tablet Take 1 tablet (400 mg total) by mouth daily. 09/05/23   Kale, Gautam  Candida, MD  allopurinol  (ZYLOPRIM ) 100 MG tablet Take 1 tablet (100 mg total) by mouth 2 (two) times daily. 09/04/23   [name] Emaline Candida, MD  apixaban  (ELIQUIS ) 5 MG TABS tablet Take 2.5 mg by mouth 2 (two) times daily. 12/20/22   [provider]  atenolol  (TENORMIN ) 25 MG tablet TAKE 1  TABLET BY MOUTH DAILY 10/02/23   Verlin Lonni BIRCH, MD  brimonidine  (ALPHAGAN ) 0.2 % ophthalmic solution Place 1 drop into the right eye 2 (two) times daily.    [provider]  cholecalciferol  (VITAMIN D ) 1000 UNITS tablet Take 1,000 Units by mouth daily.    [provider]  Cyanocobalamin  (B-12) 2500 MCG TABS Take 2,500 mcg by mouth daily.    [provider]  dexamethasone  (DECADRON ) 4 MG tablet Take 2 tablets (8 mg total) by mouth daily. Start the day after bendamustine  chemotherapy for 2 days. Take with food. 09/05/23   [name] Emaline Candida, MD  finasteride  (PROSCAR ) 5 MG tablet Take 5 mg by mouth daily.    [provider]  furosemide  (LASIX ) 20 MG tablet TAKE 1 TABLET BY MOUTH TWICE  DAILY 02/19/22   Parthenia Olivia HERO, PA-C  gabapentin (NEURONTIN) 100 MG capsule Take 1 capsule by mouth 2 (two) times daily as needed (for knee pain). 10/24/21   [provider]  nitroGLYCERIN  (NITROSTAT ) 0.4 MG SL tablet Place 1 tablet (0.4 mg total) under the tongue every 5 (five) minutes as needed for chest pain. 09/29/23 01/16/25  Verlin Lonni BIRCH, MD  ondansetron  (ZOFRAN ) 8 MG tablet Take 1 tablet (8 mg total) by mouth every 8 (eight) hours as needed for nausea or vomiting. Start on the third day after chemotherapy. 09/05/23   [name] Emaline Candida, MD  potassium chloride  SA (KLOR-CON  M) 20 MEQ tablet Take 1 tablet (20 mEq total) by mouth daily. Please keep upcoming appointment for future refills. Thank you. 11/23/22   Odell Celinda Balo, MD  pravastatin  (PRAVACHOL ) 40 MG tablet Take 1 tablet (40 mg total) by mouth at bedtime. 12/26/21   Verlin Lonni BIRCH, MD  prochlorperazine  (COMPAZINE ) 10 MG tablet Take 1 tablet (10 mg total) by mouth every 6 (six) hours as needed for nausea or vomiting. 09/05/23   Kale, Gautam Kishore, MD  sucralfate (CARAFATE) 1 g tablet Take 1 g by mouth 4 (four) times daily -  with meals and at bedtime. 02/04/23 02/04/24  [provider]  ticagrelor  (BRILINTA ) 90 MG TABS tablet Take 1 tablet (90 mg total) by mouth 2 (two) times daily. 01/30/23   Henry Manuelita NOVAK, NP    Physical Exam: Vitals:   10/13/23 1525 10/13/23 1534 10/13/23 1535  BP: 124/63    Pulse: 71    Resp: 16    Temp: 98 F (36.7 C)    TempSrc: Oral    SpO2: 100%    Weight:   70.5 kg  Height:  5' 6 (1.676 m)    Physical Exam Vitals and nursing note reviewed.  Constitutional:      Appearance: He is ill-appearing.  HENT:     Head: Normocephalic.     Nose: No rhinorrhea.     Mouth/Throat:     Mouth: Mucous membranes are moist.  Eyes:     General: No scleral icterus.    Pupils: Pupils are equal, round, and reactive to light.  Cardiovascular:     Rate and Rhythm: Normal rate and regular rhythm.  Pulmonary:     Effort: Pulmonary effort is normal.  Breath sounds: Normal breath sounds. No wheezing, rhonchi or rales.  Abdominal:     General: Bowel sounds are normal. There is no distension.     Palpations: Abdomen is soft.     Tenderness: There is no abdominal tenderness. There is no right CVA tenderness or left CVA tenderness.  Musculoskeletal:     Cervical back: Neck supple.     Right lower leg: No edema.     Left lower leg: No edema.  Skin:    General: Skin is warm and dry.  Neurological:     General: No focal deficit present.     Mental Status: He is alert and oriented to person, place, and time.  Psychiatric:        Mood and Affect: Mood normal.        Behavior: Behavior normal.        Data Reviewed:  Results are pending, will review when available.  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 Monitor airway patency. Monitor for dysphagia. ENT on-call that would like to set up this as outpatient Will discuss with Dr. [name] if IR guided imaging will be an  option.  Active Problems:   HTN (hypertension)   HLD (hyperlipidemia)   CAD (coronary artery disease)   Chronic diastolic CHF (congestive heart failure), NYHA class 2 (HCC) Awaiting for med reconciliation. Will resume home meds once med reconciliation done.    Advance Care Planning:   Code Status: Full Code   Consults: Oncology (Dr. JUDITHANN [name])  Family Communication:   Severity of Illness: The appropriate patient status for this patient is INPATIENT. Inpatient status is judged to be reasonable and necessary in order to provide the required intensity of service to ensure the patient's safety. The patient's presenting symptoms, physical exam findings, and initial radiographic and laboratory data in the context of their chronic comorbidities is felt to place them at high risk for further clinical deterioration. Furthermore, it is not anticipated that the patient will be medically stable for discharge from the hospital within 2 midnights of admission.   * I certify that at the point of admission it is my clinical judgment that the patient will require inpatient hospital care spanning beyond 2 midnights from the point of admission due to high intensity of service, high risk for further deterioration and high frequency of surveillance required.*  Author: Alm Dorn Castor, MD 10/13/2023 3:50 PM  For on call review [email or web address].   This document was prepared using Dragon voice recognition software and may contain some unintended transcription errors.

## 2023-10-14 ENCOUNTER — Inpatient Hospital Stay (HOSPITAL_COMMUNITY)

## 2023-10-14 ENCOUNTER — Inpatient Hospital Stay

## 2023-10-14 ENCOUNTER — Telehealth (INDEPENDENT_AMBULATORY_CARE_PROVIDER_SITE_OTHER): Payer: Self-pay | Admitting: Otolaryngology

## 2023-10-14 DIAGNOSIS — D649 Anemia, unspecified: Secondary | ICD-10-CM | POA: Diagnosis not present

## 2023-10-14 DIAGNOSIS — N448 Other noninflammatory disorders of the testis: Secondary | ICD-10-CM | POA: Diagnosis not present

## 2023-10-14 DIAGNOSIS — C8218 Follicular lymphoma grade II, lymph nodes of multiple sites: Secondary | ICD-10-CM

## 2023-10-14 LAB — HEPATITIS B DNA, ULTRAQUANTITATIVE, PCR
HBV DNA SERPL PCR-ACNC: <10 [IU]/mL
HBV DNA SERPL PCR-LOG IU: UNDETERMINED {Log_IU}/mL

## 2023-10-14 MED ORDER — FINASTERIDE 5 MG PO TABS
5.0000 mg | ORAL_TABLET | Freq: Every day | ORAL | Status: DC
Start: 2023-10-14 — End: 2023-10-16
  Administered 2023-10-14 – 2023-10-16 (×3): 5 mg via ORAL
  Filled 2023-10-14 (×3): qty 1

## 2023-10-14 MED ORDER — ACETAMINOPHEN 325 MG PO TABS
650.0000 mg | ORAL_TABLET | Freq: Four times a day (QID) | ORAL | Status: DC | PRN
  Administered 2023-10-14 – 2023-10-15 (×2): 650 mg via ORAL
  Filled 2023-10-14 (×2): qty 2

## 2023-10-14 MED ORDER — ATENOLOL 25 MG PO TABS
25.0000 mg | ORAL_TABLET | Freq: Every day | ORAL | Status: DC
Start: 2023-10-14 — End: 2023-10-16
  Administered 2023-10-14 – 2023-10-16 (×2): 25 mg via ORAL
  Filled 2023-10-14 (×3): qty 1

## 2023-10-14 MED ORDER — BRIMONIDINE TARTRATE 0.2 % OP SOLN
1.0000 [drp] | Freq: Two times a day (BID) | OPHTHALMIC | Status: DC
  Administered 2023-10-14 – 2023-10-16 (×4): 1 [drp] via OPHTHALMIC
  Filled 2023-10-14: qty 5

## 2023-10-14 MED ORDER — PRAVASTATIN SODIUM 20 MG PO TABS
40.0000 mg | ORAL_TABLET | Freq: Every day | ORAL | Status: DC
  Administered 2023-10-14 – 2023-10-15 (×2): 40 mg via ORAL
  Filled 2023-10-14 (×2): qty 2

## 2023-10-14 MED ORDER — FUROSEMIDE 20 MG PO TABS
20.0000 mg | ORAL_TABLET | Freq: Two times a day (BID) | ORAL | Status: DC
  Administered 2023-10-14 – 2023-10-16 (×3): 20 mg via ORAL
  Filled 2023-10-14 (×4): qty 1

## 2023-10-14 MED ORDER — VITAMIN D 25 MCG (1000 UNIT) PO TABS
1000.0000 [IU] | ORAL_TABLET | Freq: Every day | ORAL | Status: DC
  Administered 2023-10-14 – 2023-10-16 (×3): 1000 [IU] via ORAL
  Filled 2023-10-14 (×3): qty 1

## 2023-10-14 MED ORDER — VITAMIN B-12 1000 MCG PO TABS
2000.0000 ug | ORAL_TABLET | Freq: Every day | ORAL | Status: DC
  Administered 2023-10-14 – 2023-10-16 (×3): 2000 ug via ORAL
  Filled 2023-10-14 (×4): qty 2

## 2023-10-14 MED ORDER — IOHEXOL 300 MG/ML  SOLN
75.0000 mL | Freq: Once | INTRAMUSCULAR | Status: AC | PRN
  Administered 2023-10-14: 75 mL via INTRAVENOUS

## 2023-10-14 MED ORDER — POTASSIUM CHLORIDE CRYS ER 20 MEQ PO TBCR
20.0000 meq | EXTENDED_RELEASE_TABLET | Freq: Every day | ORAL | Status: DC
  Administered 2023-10-14 – 2023-10-16 (×3): 20 meq via ORAL
  Filled 2023-10-14 (×3): qty 1

## 2023-10-14 MED ORDER — ALLOPURINOL 100 MG PO TABS
100.0000 mg | ORAL_TABLET | Freq: Two times a day (BID) | ORAL | Status: DC
  Administered 2023-10-15 – 2023-10-16 (×3): 100 mg via ORAL
  Filled 2023-10-14 (×4): qty 1

## 2023-10-14 NOTE — Plan of Care (Signed)

## 2023-10-14 NOTE — Progress Notes (Signed)
 PROGRESS NOTE   Harold [name]  FMW:[id number]    DOB: 15-Nov-1934    DOA: 10/13/2023  PCP: [name], MD   I have briefly reviewed patients previous medical [facility] in [facility].   Brief Hospital Course:  89 year old male with medical history significant for low-grade follicular lymphoma of right cervical/right facial mass, HTN, HLD, CAD s/p stents remotely, chronic diastolic CHF, DVT no longer on Xarelto, was admitted from the oncologist office on 7/28 due to rapidly enlarging right neck mass with concern for high-grade lymphoma.  Medical oncology consulted and recommended open biopsy but ENT unable to do it here.  IR consulted for multiple core biopsies, high risk for bleeding due to vascular tumor and open wound postprocedure but it appears that they may be considering it for 7/30 along with Port-A-Cath placement.   Assessment & Plan:   Low-grade follicular lymphoma Rapidly enlarging right neck mass Concern for high-grade lymphoma CT neck with contrast 7/29: Very large and transfacial up to 12 cm long axis lymphomatous mass about the right mandible, corresponding to the large FDG avid tumor in June.  Adjacent abnormal level 1A lymph node in the midline is new or progressed since June. Medical oncology/Dr. [name] consulted and recommended open biopsy but ENT/Dr. Soldatova indicated that they are unable to do that procedure here and will need transfer to a tertiary hospital for same.   IR consulted for multiple core biopsies, procedure high risk for bleeding due to vascular tumor and open wound postprocedure but it appears that they may be considering it for 7/30 along with Port-A-Cath placement. Extensive communication ongoing between Dr. [name] and Dr. Karalee, IR. As per Dr. [name], ordered 2D echo.  He is considering chemotherapy after pathology and port placement.  Essential hypertension Controlled.  Continue home dose of atenolol  25 Mg daily  Hyperlipidemia Continue home  dose of pravastatin  40 Mg daily  Chronic diastolic CHF Apart from bilateral leg edema which may be multifactorial, has no respiratory symptoms Continue home dose of Lasix  20 Mg twice daily along with potassium supplements  CAD s/p remote stents No angina No longer on Brilinta  Continue beta-blockers and statins  BPH Continue Proscar .  Anemia of chronic disease Hemoglobin stable.  Body mass index is 25.07 kg/m.   DVT prophylaxis:   Chemical DVT prophylaxis on hold in preparation for upcoming procedures.  SCDs.   Code Status: Full Code:  Family Communication: Daughter and son-in-law at bedside. Disposition:  Status is: Inpatient Not inpatient appropriate, will call UM team and downgrade to OBS.      Consultants:     Procedures:     Subjective:  Patient reports rapidly enlarging right-sided neck swelling.  Reports some pain associated with it and some hearing abnormality but is not clear about it.  Chewing/swallowing difficulty if he is not careful, states medication sometimes stay in that site of the cheek.  Denies difficulty breathing.  Objective:   Vitals:   10/13/23 1953 10/14/23 0006 10/14/23 0547 10/14/23 1326  BP: (!) 100/46 (!) 102/52 (!) 100/50 (!) 120/58  Pulse: 68 65 68 75  Resp: 18 18 18    Temp: 97.9 F (36.6 C) (!) 97.5 F (36.4 C) 97.8 F (36.6 C) 97.7 F (36.5 C)  TempSrc: Oral Oral Oral Oral  SpO2: 100% 99% 100% 98%  Weight:      Height:        General exam: Elderly male, moderately built and chronically ill looking sitting up comfortably in reclining chair. ENT: Large  lobulated mass right side of neck/lower face as in detailed picture below.  Mildly increased warmth but no tenderness.  Nonpulsatile.  Do see some superficial blood vessels. Respiratory system: Clear to auscultation. Respiratory effort normal. Cardiovascular system: S1 & S2 heard, RRR. No JVD, murmurs, rubs, gallops or clicks. No pedal edema. Gastrointestinal system: Abdomen is  nondistended, soft and nontender. No organomegaly or masses felt. Normal bowel sounds heard. Central nervous system: Alert and oriented. No focal neurological deficits. Extremities: Symmetric 5 x 5 power. Skin: No rashes, lesions or ulcers Psychiatry: Judgement and insight appear normal. Mood & affect appropriate.        Data Reviewed:   I have personally reviewed following labs and imaging studies   CBC: Recent Labs  Lab 10/13/23 1212  WBC 5.0  NEUTROABS 2.8  HGB 11.0*  HCT 34.2*  MCV 85.7  PLT 269    Basic Metabolic Panel: Recent Labs  Lab 10/13/23 1212  NA 143  K 3.6  CL 103  CO2 35*  GLUCOSE 85  BUN 22  CREATININE 1.00  CALCIUM 8.9    Liver Function Tests: Recent Labs  Lab 10/13/23 1212  AST 15  ALT 10  ALKPHOS 53  BILITOT 0.5  PROT 6.1*  ALBUMIN 3.6    CBG: No results for input(s): GLUCAP in the last 168 hours.  Microbiology Studies:  No results found for this or any previous visit (from the past 240 hours).  Radiology Studies:  CT SOFT TISSUE NECK W CONTRAST Result Date: 10/14/2023 CLINICAL DATA:  89 year old male with a rapidly enlarging right face mass and history of follicular lymphoma. EXAM: CT NECK WITH CONTRAST TECHNIQUE: Multidetector CT imaging of the neck was performed using the standard protocol following the bolus administration of intravenous contrast. RADIATION DOSE REDUCTION: This exam was performed according to the departmental dose-optimization program which includes automated exposure control, adjustment of the mA and/or kV according to patient size and/or use of iterative reconstruction technique. CONTRAST:  75mL OMNIPAQUE  IOHEXOL  300 MG/ML  SOLN COMPARISON:  Face/neck soft tissue ultrasound 08/27/2023. PET-CT 08/25/2023. Neck CT 04/13/2020. FINDINGS: Pharynx and larynx: Larynx and pharynx soft tissue contours, mucosal enhancement remain within normal limits. Negative parapharyngeal and retropharyngeal spaces. Salivary glands:  Large, amorphous, relatively homogeneous trans spatial soft tissue mass about the right mandible body and ramus corresponding to similar appearing mass with intense FDG uptake on June PET-CT. The bulk of the mass is superficial to the mandible oil as in June. This mass with indistinct margins encompasses 95 x 44 by 124 mm (AP by transverse by CC) subcutaneous and dermis involvement. Some involvement of the right lower masticator space. Extension into the right submandibular space with mild mass effect on the right submandibular gland (series 2, image 62). Relatively spared left sublingual space. Left anterior inferior parotid space mildly affected. Contralateral left submandibular, masticator and parotid spaces are negative. Thyroid : Negative. Lymph nodes: Large superficial right face mass described above. Associated contiguous abnormal right level 1A lymph node in the midline which is 18 mm short axis on series 2, image 73. This is new or progressed since 08/25/2023. But little if any additional cervical lymphadenopathy, right level 2 nodes remain subcentimeter. Vascular: Both internal jugular veins in the neck appear partially effaced but remain patent. Other major vascular structures in the bilateral neck and at the skull base are patent. Right greater than left carotid bifurcation atherosclerosis. Limited intracranial: Negative for age. Visualized orbits: Stable since 2022 and negative. Mastoids and visualized paranasal sinuses: Visualized  paranasal sinuses and mastoids are stable and well aerated. Skeleton: Despite the bulky adjacent soft tissue mass the right mandible appears to remain intact and normally located. And no acute or suspicious osseous lesion is identified, with chronic cervical spine degeneration. Upper chest: Bilateral lung apex and subpleural mild lung scarring. No visible superior mediastinal or axillary lymphadenopathy. IMPRESSION: 1. Very large and trans-spatial up to 12 cm long axis  Lymphomatous Mass about the right mandible, corresponding to the large FDG avid tumor in June. Adjacent abnormal level 1A lymph node in the midline is new or progressed since June. 2. But no other lymphadenopathy in the Neck or at the thoracic inlet. And the underlying right mandible remains intact. Electronically Signed   By: VEAR Hurst M.D.   On: 10/14/2023 13:16    Scheduled Meds:    Chlorhexidine  Gluconate Cloth  6 each Topical Daily    Continuous Infusions:     LOS: 1 day     Trenda Mar, MD,  FACP, [facility], [facility], [facility]   [facility] [place]      To contact the attending provider between 7A-7P or the covering provider during after hours 7P-7A, please log into the web site [email or web address] and access using universal [place] password for that web site. If you do not have the password, please call the hospital operator.  10/14/2023, 3:19 PM

## 2023-10-14 NOTE — Plan of Care (Signed)
  Problem: Education: Goal: Knowledge of General Education information will improve Description: Including pain rating scale, medication(s)/side effects and non-pharmacologic comfort measures Outcome: Progressing   Problem: Health Behavior/Discharge Planning: Goal: Ability to manage health-related needs will improve Outcome: Progressing   Problem: Activity: Goal: Risk for activity intolerance will decrease Outcome: Progressing   Problem: Nutrition: Goal: Adequate nutrition will be maintained Outcome: Progressing   Problem: Elimination: Goal: Will not experience complications related to bowel motility Outcome: Progressing Goal: Will not experience complications related to urinary retention Outcome: Progressing   Problem: Pain Managment: Goal: General experience of comfort will improve and/or be controlled Outcome: Progressing   Problem: Safety: Goal: Ability to remain free from injury will improve Outcome: Progressing

## 2023-10-14 NOTE — Telephone Encounter (Signed)
 Called patient per Dr. Vallery request to schedule a Hospital follow up for Large Right Cervical/Mandibular area mass. Spoke with daughter, Adrien Gallon.  Patient is still in hospital and it is not clear when he will be discharged.  They did not want to schedule an appt at this time.  They will call back when they get more information about discharge.

## 2023-10-14 NOTE — Consult Note (Signed)
 Chief Complaint: Right cervical/mandibular mass; chemotherapy initiation- image guided mass core biopsy; image guided port a catheter placement   Referring Provider(s): Judeth Lulas   Supervising Physician: Jennefer Rover  Patient Status: [facility] - In-pt  History of Present Illness: Harold [name] is a 89 y.o. male with history of BPH, coronary artery disease, deep venous thrombosis, hernia, hyperlipidemia, hypertension, and low-grade follicular lymphoma.    He is followed by Dr. [name] of oncology for his low-grade follicular lymphoma who most recently saw him on 10/13/2023 and was admitted for expedited workup.   of enlarged hypermetabolic right axillary lymph node.  He is known to our service. He underwent biopsy of an enlarged hypermetabolic right axillary lymph node by Dr. Karalee on 08/27/23.  The reason the lymph node was biopsied instead of the facial mass per Dr. Jacquelin note = The right facial mass demonstrates significant internal vascularity and in cases the facial nerve and artery. Additionally, the skin is taut over the lesion. Therefore, a more optimal biopsy target can be found in the right axillary region where there are several enlarged and hypermetabolic lymph nodes.   IR is asked to biopsy the facial mass and place a Port A Cath for chemotherapy initiation.    Imaging was reviewed and approved by Dr. Jennefer 10/14/2023 for image guided right cervical\mandibular mass core biopsy and Port placement.  Patient is Full Code  Past Medical History:  Diagnosis Date   BPH (benign prostatic hypertrophy)    CAD (coronary artery disease) 01/2010   S/P drug eluting stents LAD and PL branch of RCA Dr. 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   follicular lymphoma dx'd 06/2017   Hernia    HLD (hyperlipidemia)    HTN (hypertension)     Past Surgical History:  Procedure Laterality Date   BACK SURGERY     CORONARY  ANGIOGRAPHY N/A 01/29/2023   Procedure: CORONARY ANGIOGRAPHY;  Surgeon: Verlin Lonni BIRCH, MD;  Location: [facility];  Service: Cardiovascular;  Laterality: N/A;   CORONARY BALLOON ANGIOPLASTY N/A 01/29/2023   Procedure: CORONARY BALLOON ANGIOPLASTY;  Surgeon: Verlin Lonni BIRCH, MD;  Location: [facility];  Service: Cardiovascular;  Laterality: N/A;   CORONARY LITHOTRIPSY N/A 01/29/2023   Procedure: CORONARY LITHOTRIPSY;  Surgeon: Verlin Lonni BIRCH, MD;  Location: [facility];  Service: Cardiovascular;  Laterality: N/A;   CYSTOSCOPY WITH LITHOLAPAXY N/A 05/20/2019   Procedure: CYSTOSCOPY WITH LITHOLAPAXY;  Surgeon: Cam Morene ORN, MD;  Location: [facility];  Service: Urology;  Laterality: N/A;   CYSTOSCOPY/URETEROSCOPY/HOLMIUM LASER/STENT PLACEMENT Left 05/20/2019   Procedure: CYSTOSCOPY/URETEROSCOPY/HOLMIUM LASER/STENT PLACEMENT;  Surgeon: Cam Morene ORN, MD;  Location: [facility];  Service: Urology;  Laterality: Left;   HERNIA REPAIR  01/08/11   RIH   MASS EXCISION Right 08/05/2017   Procedure: EXCISION RIGHT POSTERIOR  NECK LYMPH NODE;  Surgeon: Ethyl Lonni BRAVO, MD;  Location: [facility];  Service: ENT;  Laterality: Right;    Allergies: Patient has no known allergies.  Medications: Prior to Admission medications   Medication Sig Start Date End Date Taking? Authorizing Provider  allopurinol  (ZYLOPRIM ) 100 MG tablet Take 1 tablet (100 mg total) by mouth 2 (two) times daily. 09/04/23  Yes [name], MD  atenolol  (TENORMIN ) 25 MG tablet TAKE 1 TABLET BY MOUTH DAILY 10/02/23  Yes Verlin Lonni BIRCH, MD  brimonidine  (ALPHAGAN ) 0.2 % ophthalmic solution Place 1 drop into  the right eye 2 (two) times daily.   Yes [provider]  cholecalciferol  (VITAMIN D ) 1000 UNITS tablet Take 1,000 Units by mouth daily.   Yes [provider]  Cyanocobalamin  (B-12) 2500 MCG TABS Take 2,500 mcg by mouth daily.   Yes  [provider]  finasteride  (PROSCAR ) 5 MG tablet Take 5 mg by mouth daily.   Yes [provider]  furosemide  (LASIX ) 20 MG tablet TAKE 1 TABLET BY MOUTH TWICE  DAILY 02/19/22  Yes Parthenia Olivia HERO, PA-C  gabapentin (NEURONTIN) 100 MG capsule Take 1 capsule by mouth 2 (two) times daily as needed (for knee pain). 10/24/21  Yes [provider]  nitroGLYCERIN  (NITROSTAT ) 0.4 MG SL tablet Place 1 tablet (0.4 mg total) under the tongue every 5 (five) minutes as needed for chest pain. 09/29/23 01/16/25 Yes Verlin Lonni BIRCH, MD  potassium chloride  SA (KLOR-CON  M) 20 MEQ tablet Take 1 tablet (20 mEq total) by mouth daily. Please keep upcoming appointment for future refills. Thank you. Patient taking differently: Take 10 mEq by mouth 2 (two) times daily. Please keep upcoming appointment for future refills. Thank you. Take with fluid pill. 11/23/22  Yes Odell Celinda Balo, MD  pravastatin  (PRAVACHOL ) 40 MG tablet Take 1 tablet (40 mg total) by mouth at bedtime. 12/26/21  Yes Verlin Lonni BIRCH, MD  acyclovir  (ZOVIRAX ) 400 MG tablet Take 1 tablet (400 mg total) by mouth daily. 09/05/23   Kale, Gautam Kishore, MD  sucralfate (CARAFATE) 1 g tablet Take 1 g by mouth 4 (four) times daily -  with meals and at bedtime. Patient not taking: Reported on 10/13/2023 02/04/23 02/04/24  [provider]  ticagrelor  (BRILINTA ) 90 MG TABS tablet Take 1 tablet (90 mg total) by mouth 2 (two) times daily. Patient not taking: Reported on 10/13/2023 01/30/23   Henry Manuelita NOVAK, NP     Family History  Problem Relation Age of Onset   Heart failure Mother     Social History   Socioeconomic History   Marital status: Married    Spouse name: Not on file   Number of children: Not on file   Years of education: Not on file   Highest education level: Not on file  Occupational History   Not on file  Tobacco Use   Smoking status: Never   Smokeless tobacco: Never  Vaping Use   Vaping  status: Never Used  Substance and Sexual Activity   Alcohol use: No   Drug use: No   Sexual activity: Not on file  Other Topics Concern   Not on file  Social History Narrative   Married   Social Drivers of Health   Financial Resource Strain: Low Risk  (11/17/2022)   Received from [facility]   Overall Financial Resource Strain (CARDIA)    Difficulty of Paying Living Expenses: Not very hard  Food Insecurity: No Food Insecurity (10/13/2023)   Hunger Vital Sign    Worried About Running Out of Food in the Last Year: Never true    Ran Out of Food in the Last Year: Never true  Transportation Needs: No Transportation Needs (10/13/2023)   PRAPARE - Administrator, Civil Service (Medical): No    Lack of Transportation (Non-Medical): No  Physical Activity: Inactive (11/17/2022)   Received from [facility]   Exercise Vital Sign    On average, how many days per week do you engage in moderate to strenuous exercise (like a brisk walk)?: 0 days  On average, how many minutes do you engage in exercise at this level?: 0 min  Stress: Stress Concern Present (11/17/2022)   Received from [facility] - Occupational Stress Questionnaire    Feeling of Stress : Rather much  Social Connections: Moderately Integrated (10/13/2023)   Social Connection and Isolation Panel    Frequency of Communication with Friends and Family: Three times a week    Frequency of Social Gatherings with Friends and Family: Once a week    Attends Religious Services: 1 to 4 times per year    Active Member of [employer] or Organizations: No    Attends Banker Meetings: 1 to 4 times per year    Marital Status: Widowed     Review of Systems: A 12 point ROS discussed and pertinent positives are indicated in the HPI above.  All other systems are negative.   Vital Signs: BP (!) 100/50 (BP Location: Right Arm)   Pulse 68   Temp 97.8 F (36.6 C) (Oral)   Resp  18   Ht 5' 6 (1.676 m)   Wt 155 lb 5 oz (70.5 kg)   SpO2 100%   BMI 25.07 kg/m   Advance Care Plan: The advanced care place/surrogate decision maker was discussed at the time of visit and the patient did not wish to discuss or was not able to name a surrogate decision maker or provide an advance care plan.  Physical Exam Vitals reviewed.  Constitutional:      Appearance: Normal appearance.  HENT:     Head: Normocephalic and atraumatic.  Eyes:     Extraocular Movements: Extraocular movements intact.  Cardiovascular:     Rate and Rhythm: Normal rate and regular rhythm.  Pulmonary:     Effort: Pulmonary effort is normal. No respiratory distress.     Breath sounds: Normal breath sounds.  Abdominal:     General: There is no distension.     Palpations: Abdomen is soft.     Tenderness: There is no abdominal tenderness.  Musculoskeletal:        General: Normal range of motion.     Cervical back: Normal range of motion.  Skin:    General: Skin is warm and dry.  Neurological:     General: No focal deficit present.     Mental Status: He is alert and oriented to person, place, and time.  Psychiatric:        Mood and Affect: Mood normal.        Behavior: Behavior normal.        Thought Content: Thought content normal.        Judgment: Judgment normal.     Imaging: No results found.  Labs:  CBC: Recent Labs    09/04/23 1000 09/15/23 0931 09/29/23 1120 10/13/23 1212  WBC 6.6 10.1 4.9 5.0  HGB 11.3* 12.3* 11.2* 11.0*  HCT 35.0* 38.6* 34.0* 34.2*  PLT 234 226 146* 269    COAGS: Recent Labs    11/17/22 1826 01/29/23 0629 01/29/23 2010 01/30/23 1041  INR 1.4*  --   --   --   APTT  --  35 160* 38*    BMP: Recent Labs    09/04/23 1000 09/15/23 0931 09/29/23 1120 10/13/23 1212  NA 146* 145 140 143  K 3.6 3.3* 4.0 3.6  CL 105 104 103 103  CO2 33* 32 32 35*  GLUCOSE 94 96 88 85  BUN 28* 29*  18 22  CALCIUM 9.6 9.7 8.9 8.9  CREATININE 1.16 1.13 0.96 1.00   GFRNONAA >60 >60 >60 >60    LIVER FUNCTION TESTS: Recent Labs    09/04/23 1000 09/15/23 0931 09/29/23 1120 10/13/23 1212  BILITOT 0.9 0.8 0.7 0.5  AST 17 14* 22 15  ALT 10 12 16 10   ALKPHOS 64 61 51 53  PROT 6.8 6.9 5.9* 6.1*  ALBUMIN 4.2 4.3 3.6 3.6    TUMOR MARKERS: No results for input(s): AFPTM, CEA, CA199, CHROMGRNA in the last 8760 hours.  Assessment and Plan:  Large Right facial/mandibular mass = previous biopsy of hypermetabolic lymph node was positive for Follicular Lymphoma.  Images reviewed by Dr. Jennefer and by Dr. Karalee - the mass is extremely hypervascular and encases the facial nerves. Biopsy of this mass is very risky. ENT also feels biopsy is risky based on location.   Risks and benefits of image guided placement of a tunneled catheter with port was discussed with the patient and his wife including, but not limited to bleeding, infection, pneumothorax, or fibrin sheath development and need for additional procedures.  All of the patient's questions were answered, patient is agreeable to proceed.  Consent signed and in IR control room.  Will plan for procedure tomorrow, 10/15/23 due to IR unable to accommodate today.   Electronically Signed:   SARI GORMAN LAMP PA-C 10/14/2023 1:08 PM      I spent a total of 40 minutes in face to face in clinical consultation, greater than 50% of which was counseling/coordinating care for image guided right cervical mandibular mass biopsy and image guided portacatheter placement.

## 2023-10-14 NOTE — Progress Notes (Cosign Needed)
 Harold [name]   DOB:07-Apr-1934   FM#:[id number]      ASSESSMENT & PLAN:  Harold [name] is an 89 year old male patient with oncologic history significant for follicular lymphoma.  Patient was seen in the outpatient oncology center yesterday 7/28 at which time it was noted that his right neck mass is rapidly enlarging.  Therefore he was sent for admission for further evaluation and treatment.  Dr. [name] following closely.   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 --LN biopsy of right axilla done 6/11, showed low grade FL. Need biopsy of actual mass to be done urgently.  - Needs immediate inpatient incisional biopsy of rapidly enlarging right cervical-mandible area mass that may have transformed into high-grade follicular lymphoma. -- ENT eval needed. IR eval also recommended. Discussion in progress on possibly transferring patient to tertiary center for expeditious biopsy. Ongoing discussion.  --Needs ECHO --Needs port for chemotherapy  --Plan initiation of chemotherapy after biopsy obtained.   --CT imaging neck done today 7/29, pending results - Medical oncology/Dr. [name] following closely  Anemia, normocytic - Mild - Hemoglobin 11.0 on 7/28 - Baseline appears to be in the 11 range - Continue to monitor CBC with differential  History of melanoma - On chin - Status post resection  History of head and neck basal cell carcinoma - Continue close follow-up with Derm    Code Status Full  Subjective:  Patient seen awake alert and oriented x3 laying in bed. Family members at bedside. Noted large right facial mass which patient reports is becoming more painful and difficult to eat, although currently NPO pending biopsy.  No other acute complaints offered.   Objective:   Intake/Output Summary (Last 24 hours) at 10/14/2023 1057 Last data  filed at 10/14/2023 0744 Gross per 24 hour  Intake 30 ml  Output --  Net 30 ml     PHYSICAL EXAMINATION: ECOG PERFORMANCE STATUS: 3 - Symptomatic, >50% confined to bed  Vitals:   10/14/23 0006 10/14/23 0547  BP: (!) 102/52 (!) 100/50  Pulse: 65 68  Resp: 18 18  Temp: (!) 97.5 F (36.4 C) 97.8 F (36.6 C)  SpO2: 99% 100%   Filed Weights   10/13/23 1535  Weight: 155 lb 5 oz (70.5 kg)    GENERAL: alert, +chronically ill-appearing +large right-sided mandibular mass SKIN: +darkened areas on bil forearms with poor turgor  EYES: normal, conjunctiva are pink and non-injected, sclera clear OROPHARYNX: no exudate, no erythema and lips, buccal mucosa, and tongue normal  NECK: +right sided cervical mass  LYMPH: no palpable lymphadenopathy in the cervical, axillary or inguinal LUNGS: clear to auscultation and percussion with normal breathing effort HEART: regular rate & rhythm and no murmurs and no lower extremity edema ABDOMEN: abdomen soft, non-tender and normal bowel sounds MUSCULOSKELETAL: no cyanosis of digits and no clubbing  PSYCH: alert & oriented x 3 with fluent speech    All questions were answered. The patient knows to call the clinic with any problems, questions or concerns.   The total time spent in the appointment was 50 minutes encounter with patient including review of chart and various tests results, discussions about plan of care and coordination of care plan  [name], NP 10/14/2023 10:57 AM    Labs Reviewed:  Lab Results  Component Value Date   WBC 5.0 10/13/2023   HGB 11.0 (L) 10/13/2023   HCT 34.2 (L) 10/13/2023  MCV 85.7 10/13/2023   PLT 269 10/13/2023   Recent Labs    09/15/23 0931 09/29/23 1120 10/13/23 1212  NA 145 140 143  K 3.3* 4.0 3.6  CL 104 103 103  CO2 32 32 35*  GLUCOSE 96 88 85  BUN 29* 18 22  CREATININE 1.13 0.96 1.00  CALCIUM 9.7 8.9 8.9  GFRNONAA >60 >60 >60  PROT 6.9 5.9* 6.1*  ALBUMIN 4.3 3.6 3.6  AST 14* 22 15   ALT 12 16 10   ALKPHOS 61 51 53  BILITOT 0.8 0.7 0.5    Studies Reviewed:  No results found.   ADDENDUM  .Patient was Personally and independently interviewed, examined and relevant elements of the history of present illness were reviewed in details and an assessment and plan was created. All elements of the patient's history of present illness , assessment and plan were discussed in details with Olam Brunner NP. The above documentation reflects our combined findings assessment and plan.   Patient notes that his right submandibular/facial mass has been rapidly growing.  His other lymphadenopathy appears to have resolved.  The clinical picture is certainly concerning with high-grade lymphoma as a result of histologic transformation of his underlying follicular lymphoma.  This was characterized by partial response with rapid progression of the single involved site. His relevant labs and imaging studies were reviewed in detail with the patient.  I also called his daughter-in-law [name] and updated her on the situation. There has been significant concerns regarding risk of biopsy with regards to core needle biopsy by IR as well as surgical biopsy by ENT. ENT does not feel it would be possible to do a surgical biopsy due to increased vascularity in the area presence of the facial nerve and potentially poor wound healing. Interventional radiology also suggested increased risk of bleeding poor wound healing with drainage and possible potential damage to the nerve but will have a real-time look at the tumor for possible reasonably safe window for core needle biopsy when they are putting the port in on 10/15/2023. The patient and his daughter in law do not want to be transferred to an outside hospital for the biopsy and prefer to continue the care here. Echo has been scheduled for 10/15/2023. I discussed with the patient and his daughter that a biopsy for tissue diagnosis is would be the standard of care  prior to switching his treatment.  If there is absolutely no way of getting a biopsy then we will have to discuss his comfort with the switching treatments without histologic diagnosis based on clinical considerations. We are intending to start him on R mini CHOP or dose reduced R-CEOP ideally after tissue diagnosis. Appreciate input from the whole team. He may need IV fluids if poor p.o. intake due to right facial tumor.  Will defer this to hospital medicine.  Gautam Kale MD MS

## 2023-10-15 ENCOUNTER — Other Ambulatory Visit: Payer: Self-pay | Admitting: Hematology

## 2023-10-15 ENCOUNTER — Inpatient Hospital Stay (HOSPITAL_COMMUNITY)

## 2023-10-15 DIAGNOSIS — I2510 Atherosclerotic heart disease of native coronary artery without angina pectoris: Secondary | ICD-10-CM | POA: Diagnosis not present

## 2023-10-15 DIAGNOSIS — D649 Anemia, unspecified: Secondary | ICD-10-CM | POA: Diagnosis not present

## 2023-10-15 DIAGNOSIS — C8338 Diffuse large B-cell lymphoma, lymph nodes of multiple sites: Secondary | ICD-10-CM | POA: Diagnosis not present

## 2023-10-15 DIAGNOSIS — C8218 Follicular lymphoma grade II, lymph nodes of multiple sites: Secondary | ICD-10-CM | POA: Diagnosis not present

## 2023-10-15 DIAGNOSIS — N448 Other noninflammatory disorders of the testis: Secondary | ICD-10-CM | POA: Diagnosis not present

## 2023-10-15 HISTORY — PX: IR US GUIDE BX ASP/DRAIN: IMG2392

## 2023-10-15 HISTORY — PX: IR IMAGING GUIDED PORT INSERTION: IMG5740

## 2023-10-15 LAB — ECHOCARDIOGRAM COMPLETE
AR max vel: 1.07 cm2
AV Area VTI: 1.07 cm2
AV Area mean vel: 0.94 cm2
AV Mean grad: 11 mmHg
AV Peak grad: 18.8 mmHg
Ao pk vel: 2.17 m/s
Area-P 1/2: 2.16 cm2
Height: 66 in
MV VTI: 1.02 cm2
S' Lateral: 2.3 cm
Weight: 2485.03 [oz_av]

## 2023-10-15 LAB — PROTIME-INR
INR: 1.1 (ref 0.8–1.2)
Prothrombin Time: 14.4 s (ref 11.4–15.2)

## 2023-10-15 MED ORDER — PREDNISONE 20 MG PO TABS
60.0000 mg | ORAL_TABLET | Freq: Every day | ORAL | Status: DC
  Administered 2023-10-15 – 2023-10-16 (×2): 60 mg via ORAL
  Filled 2023-10-15 (×2): qty 3

## 2023-10-15 MED ORDER — LIDOCAINE-EPINEPHRINE 1 %-1:100000 IJ SOLN
20.0000 mL | Freq: Once | INTRAMUSCULAR | Status: DC
  Filled 2023-10-15: qty 20

## 2023-10-15 MED ORDER — PROCHLORPERAZINE MALEATE 10 MG PO TABS: 10.0000 mg | ORAL_TABLET | Freq: Four times a day (QID) | ORAL | 6 refills | Status: DC | PRN

## 2023-10-15 MED ORDER — FENTANYL CITRATE (PF) 100 MCG/2ML IJ SOLN
INTRAMUSCULAR | Status: AC | PRN
  Administered 2023-10-15: 25 ug via INTRAVENOUS

## 2023-10-15 MED ORDER — FENTANYL CITRATE (PF) 100 MCG/2ML IJ SOLN
INTRAMUSCULAR | Status: AC
Start: 2023-10-15 — End: 2023-10-15
  Filled 2023-10-15: qty 2

## 2023-10-15 MED ORDER — MIDAZOLAM HCL 2 MG/2ML IJ SOLN
INTRAMUSCULAR | Status: AC | PRN
  Administered 2023-10-15: .5 mg via INTRAVENOUS

## 2023-10-15 MED ORDER — ETOPOSIDE 50 MG PO CAPS: 100.0000 mg | ORAL_CAPSULE | ORAL | 0 refills | Status: DC

## 2023-10-15 MED ORDER — MIDAZOLAM HCL 2 MG/2ML IJ SOLN
INTRAMUSCULAR | Status: AC | PRN
  Administered 2023-10-15: 1 mg via INTRAVENOUS

## 2023-10-15 MED ORDER — LIDOCAINE-PRILOCAINE 2.5-2.5 % EX CREA: TOPICAL_CREAM | CUTANEOUS | 3 refills | Status: DC

## 2023-10-15 MED ORDER — ONDANSETRON HCL 8 MG PO TABS: 8.0000 mg | ORAL_TABLET | Freq: Three times a day (TID) | ORAL | 1 refills | Status: DC | PRN

## 2023-10-15 MED ORDER — PREDNISONE 20 MG PO TABS: 60.0000 mg | ORAL_TABLET | Freq: Every day | ORAL | 3 refills | Status: DC

## 2023-10-15 MED ORDER — ACYCLOVIR 400 MG PO TABS: 400.0000 mg | ORAL_TABLET | Freq: Every day | ORAL | 3 refills | Status: DC

## 2023-10-15 MED ORDER — MIDAZOLAM HCL 2 MG/2ML IJ SOLN
INTRAMUSCULAR | Status: AC
  Filled 2023-10-15: qty 2

## 2023-10-15 MED ORDER — FENTANYL CITRATE (PF) 100 MCG/2ML IJ SOLN
INTRAMUSCULAR | Status: AC | PRN
  Administered 2023-10-15: 50 ug via INTRAVENOUS

## 2023-10-15 MED ORDER — LIDOCAINE-EPINEPHRINE 1 %-1:100000 IJ SOLN
INTRAMUSCULAR | Status: AC
  Filled 2023-10-15: qty 1

## 2023-10-15 NOTE — Sedation Documentation (Signed)
 Port placement [phone] to 1025  Mandibular mass biopsy 1025 to

## 2023-10-15 NOTE — Plan of Care (Signed)

## 2023-10-15 NOTE — Sedation Documentation (Signed)
 RN Jacquez Sheetz pulled 2 mg Versed  and 100 mcg Fentanyl  in Ir room. Pt. Received 2 mg Versed  and 100 mcg Fentanyl  throughout the procedure.

## 2023-10-15 NOTE — Progress Notes (Signed)
START ON PATHWAY REGIMEN - Lymphoma and CLL     A cycle is every 21 days:     Prednisone      Rituximab-xxxx      Cyclophosphamide      Etoposide      Vincristine      Etoposide   **Always confirm dose/schedule in your pharmacy ordering system**  Patient Characteristics: Diffuse Large B-Cell Lymphoma or Follicular Lymphoma, Grade 3B, First Line, Stage III and IV Disease Type: Not Applicable Disease Type: Diffuse Large B-Cell Lymphoma Disease Type: Not Applicable Line of therapy: First Line Intent of Therapy: Curative Intent, Discussed with Patient 

## 2023-10-15 NOTE — Progress Notes (Signed)
 PROGRESS NOTE    Harold [name]  FMW:[id number] DOB: January 09, 1935 DOA: 10/13/2023 PCP: Harold Lenis, MD    Brief Narrative:   Harold [name] is a 89 y.o. 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 Medical oncology consulted and recommended open biopsy but ENT unable to do it here. IR consulted for multiple core biopsies along with Port-A-Cath placement.   Medical oncology plans to initiate inpatient chemotherapy.  Assessment & Plan:    Low-grade follicular lymphoma Rapidly enlarging right neck mass Concern for high-grade lymphoma Patient presenting as a direct admission from oncology office due to rapidly enlarging right neck mass with concern for high-grade lymphoma.  CT neck with contrast 7/29: Very large and transfacial up to 12 cm long axis lymphomatous mass about the right mandible, corresponding to the large FDG avid tumor in June.  Adjacent abnormal level 1A lymph node in the midline is new or progressed since June. Medical oncology/Dr. [name] consulted and recommended open biopsy but ENT/Dr. Soldatova indicated that they are unable to do that procedure here and will need transfer to a tertiary hospital for same.  IR consulted and underwent core biopsies and Port-A-Cath placement on 7/30. -- Medical oncology following, appreciate assistance --TTE: Pending -- Pathology: Pending   Essential hypertension -- Continue atenolol  25 mg PO daily   Hyperlipidemia -- Continue pravastatin  40 mg PO daily   Chronic diastolic CHF, compensated -- Continue home Lasix  20 mg PO BID   CAD s/p remote stents Denies chest pain/angina.  No longer on Brilinta . -- Continue statin, atenolol    BPH -- Continue Proscar .    Anemia of chronic disease Hemoglobin stable.  DVT prophylaxis: Chemical DVT prophylaxis on hold for procedure today, SCDs    Code Status: Full Code Family Communication: Daughter at bedside  Disposition Plan:  Level of care: Med-Surg Status is: Inpatient Remains inpatient appropriate because: Anticipate inpatient chemotherapy    Consultants:  ENT, Dr. Okey Medical oncology, Dr. [name] Interventional radiology  Procedures:  Port-A-Cath placement, IR 7/30 Ultrasound-guided core biopsy right facial mass, IR 7/30  Antimicrobials:  None   Subjective: Patient seen examined bedside, sitting at edge of bed.  No complaints this afternoon other than some soreness to his Port-A-Cath site.  Just returned from IR with port placement and biopsy from right facial mass.  Oncology plans to initiate inpatient chemotherapy.  Updated patient's daughter at bedside.  Patient denies headache, no visual changes, no chest pain, no palpitations, no shortness of breath, no abdominal pain, no fever/chills/night sweats, no nausea/vomiting/diarrhea, no focal weakness, no fatigue, no paresthesia.  No acute events overnight per nursing.  Objective: Vitals:   10/15/23 1040 10/15/23 1100 10/15/23 1308 10/15/23 1310  BP: 124/60 (!) 116/54 (!) 95/50 (!) 95/50  Pulse: 94 83 76 69  Resp: 12 18  20   Temp:  (!) 97.5 F (36.4 C) 97.6 F (36.4 C) 97.6 F (36.4 C)  TempSrc:  Oral Axillary Oral  SpO2: 100% 100% 100% 100%  Weight:      Height:        Intake/Output Summary (Last 24 hours) at 10/15/2023 1627 Last data filed at 10/15/2023 1300 Gross per 24 hour  Intake 120 ml  Output --  Net 120 ml   [employer]  10/13/23 1535  Weight: 70.5 kg    Examination:  Physical Exam: GEN: NAD, alert and oriented x 3, elderly/chronically ill in appearance HEENT: Large lobulated mass right neck/lower [name] as detailed in picture below, nontender to palpation, nonpulsatile. PULM: CTAB w/o wheezes/crackles,  normal respiratory effort CV: RRR w/o M/G/R GI: abd soft, NTND, + BS MSK: no peripheral edema, muscle strength globally intact 5/5 bilateral upper/lower extremities NEURO: CN II-XII intact, no focal deficits, sensation to light touch intact PSYCH: normal mood/affect Integumentary: dry/intact, no rashes or wounds       Data Reviewed: I have personally reviewed following labs and imaging studies  CBC: Recent Labs  Lab 10/13/23 1212  WBC 5.0  NEUTROABS 2.8  HGB 11.0*  HCT 34.2*  MCV 85.7  PLT 269   Basic Metabolic Panel: Recent Labs  Lab 10/13/23 1212  NA 143  K 3.6  CL 103  CO2 35*  GLUCOSE 85  BUN 22  CREATININE 1.00  CALCIUM 8.9   GFR: Estimated Creatinine Clearance: 47.9 mL/min (by C-G formula based on SCr of 1 mg/dL). Liver Function Tests: Recent Labs  Lab 10/13/23 1212  AST 15  ALT 10  ALKPHOS 53  BILITOT 0.5  PROT 6.1*  ALBUMIN 3.6   No results for input(s): LIPASE, AMYLASE in the last 168 hours. No results for input(s): AMMONIA in the last 168 hours. Coagulation Profile: Recent Labs  Lab 10/15/23 0653  INR 1.1   Cardiac Enzymes: No results for input(s): CKTOTAL, CKMB, CKMBINDEX, TROPONINI in the last 168 hours. BNP (last 3 results) No results for input(s): PROBNP in the last 8760 hours. HbA1C: No results for input(s): HGBA1C in the last 72 hours. CBG: No results for input(s): GLUCAP in the last 168 hours. Lipid Profile: No results for input(s): CHOL, HDL, LDLCALC, TRIG, CHOLHDL, LDLDIRECT in the last 72 hours. Thyroid  Function Tests: No results for input(s): TSH, T4TOTAL, FREET4, T3FREE, THYROIDAB in the last 72 hours. Anemia Panel: No results for input(s): VITAMINB12, FOLATE, FERRITIN, TIBC, IRON, RETICCTPCT in the last 72 hours. Sepsis Labs: No results for input(s): PROCALCITON, LATICACIDVEN in the last 168 hours.  No results found for this or any previous visit (from the past  240 hours).       Radiology Studies: IR IMAGING GUIDED PORT INSERTION Result Date: 10/15/2023 INDICATION: 88 year old male with a known history of low-grade follicular lymphoma with enlarging [name] all mass concerning for transformation to high-grade lymphoma. He presents for port catheter placement to establish durable venous access in preparation for chemotherapy as well as ultrasound-guided core biopsy of the facial mass. EXAM: IMPLANTED PORT A CATH PLACEMENT WITH ULTRASOUND AND FLUOROSCOPIC GUIDANCE ULTRASOUND-GUIDED CORE BIOPSY MEDICATIONS: None. ANESTHESIA/SEDATION: Versed  2 mg IV; Fentanyl  100 mcg IV; administered by the radiology nurse Moderate Sedation Time:  42 minutes The patient's vital signs and level of consciousness were continuously monitored during the procedure by the interventional radiology nurse under my direct supervision. FLUOROSCOPY: Radiation exposure index: 3 mGy reference air kerma COMPLICATIONS: None immediate. PROCEDURE: The right neck and chest was prepped with chlorhexidine , and draped in the usual sterile fashion using maximum barrier technique (cap and mask, sterile gown, sterile gloves, large sterile sheet, hand hygiene and cutaneous antiseptic). Local anesthesia was attained by infiltration with 1% lidocaine  with epinephrine . Ultrasound demonstrated patency of the right internal jugular vein, and this was documented with an image. Under real-time ultrasound guidance, this vein was accessed with a 21 gauge micropuncture needle and image documentation was performed. A small dermatotomy was  made at the access site with an 11 scalpel. A 0.018 wire was advanced into the SVC and the access needle exchanged for a 52F micropuncture vascular sheath. The 0.018 wire was then removed and a 0.035 wire advanced into the IVC. An appropriate location for the subcutaneous reservoir was selected below the clavicle and an incision was made through the skin and underlying soft tissues. The  subcutaneous tissues were then dissected using a combination of blunt and sharp surgical technique and a pocket was formed. A Bard Clear Vue single lumen power injectable portacatheter was then tunneled through the subcutaneous tissues from the pocket to the dermatotomy and the port reservoir placed within the subcutaneous pocket. The venous access site was then serially dilated and a peel away vascular sheath placed over the wire. The wire was removed and the port catheter advanced into position under fluoroscopic guidance. The catheter tip is positioned in the superior cavoatrial junction. This was documented with a spot image. The portacatheter was then tested and found to flush and aspirate well. The port was flushed with saline followed by 100 units/mL heparinized saline. The pocket was then closed in two layers using first subdermal inverted interrupted absorbable sutures followed by a running subcuticular suture. The epidermis was then sealed with Dermabond. The dermatotomy at the venous access site was also closed with Dermabond. The port catheter was left accessed and sterile bandages were applied. Next, the facial mass was evaluated. The mass was interrogated with ultrasound. A suitable biopsy entry site was identified in the submental space allowing passage through relatively normal soft tissues in a region of relatively low vascularity within the mass. The skin was sterilely prepped and draped in the usual fashion using chlorhexidine  skin prep. Local anesthesia was attained by infiltration with 1% lidocaine . A small dermatotomy was made. Under real-time ultrasound guidance, multiple 18 gauge core biopsies were obtained using the Argon automated biopsy device. Biopsy specimens were placed on a moistened Telfa pad and submitted to pathology for further analysis. Post biopsy imaging demonstrates no evidence of active bleeding or other complication. IMPRESSION: Successful placement of a right IJ approach  Bard Clear Vue port catheter with ultrasound and fluoroscopic guidance. The catheter is ready for use. Successful ultrasound-guided biopsy of right facial mass. Electronically Signed   By: Wilkie Lent M.D.   On: 10/15/2023 11:35   IR US  Guide Bx Asp/Drain Result Date: 10/15/2023 INDICATION: 89 year old male with a known history of low-grade follicular lymphoma with enlarging [name] all mass concerning for transformation to high-grade lymphoma. He presents for port catheter placement to establish durable venous access in preparation for chemotherapy as well as ultrasound-guided core biopsy of the facial mass. EXAM: IMPLANTED PORT A CATH PLACEMENT WITH ULTRASOUND AND FLUOROSCOPIC GUIDANCE ULTRASOUND-GUIDED CORE BIOPSY MEDICATIONS: None. ANESTHESIA/SEDATION: Versed  2 mg IV; Fentanyl  100 mcg IV; administered by the radiology nurse Moderate Sedation Time:  42 minutes The patient's vital signs and level of consciousness were continuously monitored during the procedure by the interventional radiology nurse under my direct supervision. FLUOROSCOPY: Radiation exposure index: 3 mGy reference air kerma COMPLICATIONS: None immediate. PROCEDURE: The right neck and chest was prepped with chlorhexidine , and draped in the usual sterile fashion using maximum barrier technique (cap and mask, sterile gown, sterile gloves, large sterile sheet, hand hygiene and cutaneous antiseptic). Local anesthesia was attained by infiltration with 1% lidocaine  with epinephrine . Ultrasound demonstrated patency of the right internal jugular vein, and this was documented with an image. Under real-time ultrasound guidance, this vein was  accessed with a 21 gauge micropuncture needle and image documentation was performed. A small dermatotomy was made at the access site with an 11 scalpel. A 0.018 wire was advanced into the SVC and the access needle exchanged for a 89F micropuncture vascular sheath. The 0.018 wire was then removed and a 0.035 wire  advanced into the IVC. An appropriate location for the subcutaneous reservoir was selected below the clavicle and an incision was made through the skin and underlying soft tissues. The subcutaneous tissues were then dissected using a combination of blunt and sharp surgical technique and a pocket was formed. A Bard Clear Vue single lumen power injectable portacatheter was then tunneled through the subcutaneous tissues from the pocket to the dermatotomy and the port reservoir placed within the subcutaneous pocket. The venous access site was then serially dilated and a peel away vascular sheath placed over the wire. The wire was removed and the port catheter advanced into position under fluoroscopic guidance. The catheter tip is positioned in the superior cavoatrial junction. This was documented with a spot image. The portacatheter was then tested and found to flush and aspirate well. The port was flushed with saline followed by 100 units/mL heparinized saline. The pocket was then closed in two layers using first subdermal inverted interrupted absorbable sutures followed by a running subcuticular suture. The epidermis was then sealed with Dermabond. The dermatotomy at the venous access site was also closed with Dermabond. The port catheter was left accessed and sterile bandages were applied. Next, the facial mass was evaluated. The mass was interrogated with ultrasound. A suitable biopsy entry site was identified in the submental space allowing passage through relatively normal soft tissues in a region of relatively low vascularity within the mass. The skin was sterilely prepped and draped in the usual fashion using chlorhexidine  skin prep. Local anesthesia was attained by infiltration with 1% lidocaine . A small dermatotomy was made. Under real-time ultrasound guidance, multiple 18 gauge core biopsies were obtained using the Argon automated biopsy device. Biopsy specimens were placed on a moistened Telfa pad and  submitted to pathology for further analysis. Post biopsy imaging demonstrates no evidence of active bleeding or other complication. IMPRESSION: Successful placement of a right IJ approach Bard Clear Vue port catheter with ultrasound and fluoroscopic guidance. The catheter is ready for use. Successful ultrasound-guided biopsy of right facial mass. Electronically Signed   By: Wilkie Lent M.D.   On: 10/15/2023 11:35   CT SOFT TISSUE NECK W CONTRAST Result Date: 10/14/2023 CLINICAL DATA:  89 year old male with a rapidly enlarging right [name] mass and history of follicular lymphoma. EXAM: CT NECK WITH CONTRAST TECHNIQUE: Multidetector CT imaging of the neck was performed using the standard protocol following the bolus administration of intravenous contrast. RADIATION DOSE REDUCTION: This exam was performed according to the departmental dose-optimization program which includes automated exposure control, adjustment of the mA and/or kV according to patient size and/or use of iterative reconstruction technique. CONTRAST:  75mL OMNIPAQUE  IOHEXOL  300 MG/ML  SOLN COMPARISON:  [name]/neck soft tissue ultrasound 08/27/2023. PET-CT 08/25/2023. Neck CT 04/13/2020. FINDINGS: Pharynx and larynx: Larynx and pharynx soft tissue contours, mucosal enhancement remain within normal limits. Negative parapharyngeal and retropharyngeal spaces. Salivary glands: Large, amorphous, relatively homogeneous trans spatial soft tissue mass about the right mandible body and ramus corresponding to similar appearing mass with intense FDG uptake on June PET-CT. The bulk of the mass is superficial to the mandible oil as in June. This mass with indistinct margins encompasses 95 x 44  by 124 mm (AP by transverse by CC) subcutaneous and dermis involvement. Some involvement of the right lower masticator space. Extension into the right submandibular space with mild mass effect on the right submandibular gland (series 2, image 62). Relatively spared left  sublingual space. Left anterior inferior parotid space mildly affected. Contralateral left submandibular, masticator and parotid spaces are negative. Thyroid : Negative. Lymph nodes: Large superficial right [name] mass described above. Associated contiguous abnormal right level 1A lymph node in the midline which is 18 mm short axis on series 2, image 73. This is new or progressed since 08/25/2023. But little if any additional cervical lymphadenopathy, right level 2 nodes remain subcentimeter. Vascular: Both internal jugular veins in the neck appear partially effaced but remain patent. Other major vascular structures in the bilateral neck and at the skull base are patent. Right greater than left carotid bifurcation atherosclerosis. Limited intracranial: Negative for age. Visualized orbits: Stable since 2022 and negative. Mastoids and visualized paranasal sinuses: Visualized paranasal sinuses and mastoids are stable and well aerated. Skeleton: Despite the bulky adjacent soft tissue mass the right mandible appears to remain intact and normally located. And no acute or suspicious osseous lesion is identified, with chronic cervical spine degeneration. Upper chest: Bilateral lung apex and subpleural mild lung scarring. No visible superior mediastinal or axillary lymphadenopathy. IMPRESSION: 1. 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 Adjacent abnormal level 1A lymph node in the midline is new or progressed since June. 2. But no other lymphadenopathy in the Neck or at the thoracic inlet. And the underlying right mandible remains intact. Electronically Signed   By: VEAR Hurst M.D.   On: 10/14/2023 13:16        Scheduled Meds:  allopurinol   100 mg Oral BID   atenolol   25 mg Oral Daily   brimonidine   1 drop Right Eye BID   Chlorhexidine  Gluconate Cloth  6 each Topical Daily   cholecalciferol   1,000 Units Oral Daily    cyanocobalamin   2,000 mcg Oral Daily   finasteride   5 mg Oral Daily   furosemide   20 mg Oral BID   lidocaine -EPINEPHrine   20 mL Intradermal Once   potassium chloride  SA  20 mEq Oral Daily   pravastatin   40 mg Oral QHS   predniSONE   60 mg Oral Q breakfast   Continuous Infusions:   LOS: 2 days    Time spent: 52 minutes spent on 10/15/2023 caring for this patient [name]-to-[name] including chart review, ordering labs/tests, documenting, discussion with nursing staff, consultants, updating family and interview/physical exam    Camellia PARAS [place], DO [facility] Available via Epic secure chat 7am-7pm After these hours, please refer to coverage provider listed on [email or web address] 10/15/2023, 4:27 PM

## 2023-10-15 NOTE — Progress Notes (Signed)
 Echocardiogram 2D Echocardiogram has been performed.  Koleen KANDICE Popper, RDCS 10/15/2023, 3:57 PM

## 2023-10-15 NOTE — Procedures (Signed)
 Interventional Radiology Procedure Note  Procedure:   1.) Placement of a right IJ approach single lumen Bard Clearvue PowerPort.  Tip is positioned at the superior cavoatrial junction and catheter is ready for immediate use.  2.) US  guided core biopsy of right facial mass  Complications: No immediate  Recommendations:  - Ok to shower tomorrow - Do not submerge for 7 days - Routine line care   Signed,  Wilkie LOIS Lent, MD

## 2023-10-15 NOTE — Progress Notes (Cosign Needed)
 Harold [name]   DOB:02-16-1935   FM#:[id number]      ASSESSMENT & PLAN:  Harold [name]. Harold [name] is an 89 year old male patient with oncologic history significant for follicular lymphoma.  Patient was seen in the outpatient oncology center yesterday 7/28 at which time it was noted that his right neck mass is rapidly enlarging.  Therefore he was sent for admission for further evaluation and treatment.  Dr. [name] following closely.    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 Likely progression to high-grade. --LN biopsy of right axilla done 6/11, showed low grade FL. Need biopsy of actual mass to be done urgently.  -- CT neck done 7/29 shows very large up to 12 cm lymphomatous mass about the right mandible, no other lymphadenopathy in the neck or thoracic inlet.  --ECHO 7/30, pending results --Port placed 7/30 for chemotherapy  --US  guided biopsy of right facial mass done today 7/30, path pending. --Plan initiation of chemotherapy after biopsy obtained.   --CT imaging neck done 7/29 - Medical oncology/Dr. [name] following closely   Anemia, normocytic - Mild - Hemoglobin 11.0 on 7/28 - Baseline appears to be in the 11 range - Continue to monitor CBC with differential   History of melanoma - On chin - Status post resection   History of head and neck basal cell carcinoma - Continue close follow-up with Derm      Code Status Full   Subjective:  Patient seen awake and alert with dressing around circumference of his face. Status post biopsy of facial mass. Family at bedside.  Reports that he is okay and wants to start treatment.  No other acute distress is noted.   Objective:  No intake or output data in the 24 hours ending 10/15/23 1024   PHYSICAL EXAMINATION: ECOG PERFORMANCE STATUS: 2 - Symptomatic, <50% confined to bed  Vitals:    10/15/23 1010 10/15/23 1015  BP: (!) 109/59 (!) 101/47  Pulse: 67 79  Resp: 13 17  Temp:    SpO2: 100% 100%   Filed Weights   10/13/23 1535  Weight: 155 lb 5 oz (70.5 kg)    GENERAL: alert, +chronically ill-appearing +large right-sided mandibular mass  SKIN: skin color, texture, turgor are normal, no rashes or significant lesions EYES: normal, conjunctiva are pink and non-injected, sclera clear OROPHARYNX: no exudate, no erythema and lips, buccal mucosa, and tongue normal  NECK: +right sided facial mass LYMPH: no palpable lymphadenopathy in the cervical, axillary or inguinal LUNGS: clear to auscultation and percussion with normal breathing effort HEART: regular rate & rhythm and no murmurs and no lower extremity edema ABDOMEN: abdomen soft, non-tender and normal bowel sounds MUSCULOSKELETAL: no cyanosis of digits and no clubbing  PSYCH: alert & oriented x 3 with fluent speech NEURO: no focal motor/sensory deficits   All questions were answered. The patient knows to call the clinic with any problems, questions or concerns.   The total time spent in the appointment was 40 minutes encounter with patient including review of chart and various tests results, discussions about plan of care and coordination of care plan  Harold JINNY Brunner, NP 10/15/2023 10:24 AM    Labs Reviewed:  Lab Results  Component Value Date   WBC 5.0 10/13/2023   HGB 11.0 (L) 10/13/2023   HCT 34.2 (L) 10/13/2023   MCV 85.7 10/13/2023   PLT 269 10/13/2023   Recent Labs    09/15/23  9068 09/29/23 1120 10/13/23 1212  NA 145 140 143  K 3.3* 4.0 3.6  CL 104 103 103  CO2 32 32 35*  GLUCOSE 96 88 85  BUN 29* 18 22  CREATININE 1.13 0.96 1.00  CALCIUM 9.7 8.9 8.9  GFRNONAA >60 >60 >60  PROT 6.9 5.9* 6.1*  ALBUMIN 4.3 3.6 3.6  AST 14* 22 15  ALT 12 16 10   ALKPHOS 61 51 53  BILITOT 0.8 0.7 0.5    Studies Reviewed:  CT SOFT TISSUE NECK W CONTRAST Result Date: 10/14/2023 CLINICAL DATA:  89 year old male  with a rapidly enlarging right face mass and history of follicular lymphoma. EXAM: CT NECK WITH CONTRAST TECHNIQUE: Multidetector CT imaging of the neck was performed using the standard protocol following the bolus administration of intravenous contrast. RADIATION DOSE REDUCTION: This exam was performed according to the departmental dose-optimization program which includes automated exposure control, adjustment of the mA and/or kV according to patient size and/or use of iterative reconstruction technique. CONTRAST:  75mL OMNIPAQUE  IOHEXOL  300 MG/ML  SOLN COMPARISON:  Face/neck soft tissue ultrasound 08/27/2023. PET-CT 08/25/2023. Neck CT 04/13/2020. FINDINGS: Pharynx and larynx: Larynx and pharynx soft tissue contours, mucosal enhancement remain within normal limits. Negative parapharyngeal and retropharyngeal spaces. Salivary glands: Large, amorphous, relatively homogeneous trans spatial soft tissue mass about the right mandible body and ramus corresponding to similar appearing mass with intense FDG uptake on June PET-CT. The bulk of the mass is superficial to the mandible oil as in June. This mass with indistinct margins encompasses 95 x 44 by 124 mm (AP by transverse by CC) subcutaneous and dermis involvement. Some involvement of the right lower masticator space. Extension into the right submandibular space with mild mass effect on the right submandibular gland (series 2, image 62). Relatively spared left sublingual space. Left anterior inferior parotid space mildly affected. Contralateral left submandibular, masticator and parotid spaces are negative. Thyroid : Negative. Lymph nodes: Large superficial right face mass described above. Associated contiguous abnormal right level 1A lymph node in the midline which is 18 mm short axis on series 2, image 73. This is new or progressed since 08/25/2023. But little if any additional cervical lymphadenopathy, right level 2 nodes remain subcentimeter. Vascular: Both  internal jugular veins in the neck appear partially effaced but remain patent. Other major vascular structures in the bilateral neck and at the skull base are patent. Right greater than left carotid bifurcation atherosclerosis. Limited intracranial: Negative for age. Visualized orbits: Stable since 2022 and negative. Mastoids and visualized paranasal sinuses: Visualized paranasal sinuses and mastoids are stable and well aerated. Skeleton: Despite the bulky adjacent soft tissue mass the right mandible appears to remain intact and normally located. And no acute or suspicious osseous lesion is identified, with chronic cervical spine degeneration. Upper chest: Bilateral lung apex and subpleural mild lung scarring. No visible superior mediastinal or axillary lymphadenopathy. IMPRESSION: 1. 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 Adjacent abnormal level 1A lymph node in the midline is new or progressed since June. 2. But no other lymphadenopathy in the Neck or at the thoracic inlet. And the underlying right mandible remains intact. Electronically Signed   By: VEAR Hurst M.D.   On: 10/14/2023 13:16   ADDEDUM  .Patient was Personally and independently interviewed, examined and relevant elements of the history of present illness were reviewed in details and an assessment and plan was created.  All elements of the patient's history of present illness , assessment and plan were discussed in details with Harold Brunner NP. The above documentation reflects our combined findings assessment and plan.   Gautam Kale MD MS

## 2023-10-16 ENCOUNTER — Other Ambulatory Visit (HOSPITAL_COMMUNITY): Payer: Self-pay

## 2023-10-16 ENCOUNTER — Telehealth: Payer: Self-pay

## 2023-10-16 DIAGNOSIS — N448 Other noninflammatory disorders of the testis: Secondary | ICD-10-CM | POA: Diagnosis not present

## 2023-10-16 DIAGNOSIS — D649 Anemia, unspecified: Secondary | ICD-10-CM | POA: Diagnosis not present

## 2023-10-16 LAB — BASIC METABOLIC PANEL WITH GFR
Anion gap: 11 (ref 5–15)
BUN: 22 mg/dL (ref 8–23)
CO2: 27 mmol/L (ref 22–32)
Calcium: 8.9 mg/dL (ref 8.9–10.3)
Chloride: 101 mmol/L (ref 98–111)
Creatinine, Ser: 0.72 mg/dL (ref 0.61–1.24)
GFR, Estimated: >60 mL/min (ref >60)
Glucose, Bld: 157 mg/dL — ABNORMAL HIGH (ref 70–99)
Potassium: 4.3 mmol/L (ref 3.5–5.1)
Sodium: 139 mmol/L (ref 135–145)

## 2023-10-16 LAB — CBC
HCT: 34.6 % — ABNORMAL LOW (ref 39.0–52.0)
Hemoglobin: 10.6 g/dL — ABNORMAL LOW (ref 13.0–17.0)
MCH: 27 pg (ref 26.0–34.0)
MCHC: 30.6 g/dL (ref 30.0–36.0)
MCV: 88.3 fL (ref 80.0–100.0)
Platelets: 260 K/uL (ref 150–400)
RBC: 3.92 MIL/uL — ABNORMAL LOW (ref 4.22–5.81)
RDW: 14.6 % (ref 11.5–15.5)
WBC: 4.9 K/uL (ref 4.0–10.5)
nRBC: 0 % (ref 0.0–0.2)

## 2023-10-16 LAB — MAGNESIUM: Magnesium: 1.9 mg/dL (ref 1.7–2.4)

## 2023-10-16 MED ORDER — HEPARIN SOD (PORK) LOCK FLUSH 100 UNIT/ML IV SOLN
500.0000 [IU] | Freq: Once | INTRAVENOUS | Status: AC
  Administered 2023-10-16: 500 [IU] via INTRAVENOUS
  Filled 2023-10-16: qty 5

## 2023-10-16 NOTE — Telephone Encounter (Signed)
 Oral Oncology Pharmacist Encounter  Received new prescription for Vepesid  (etoposide ) for the treatment of high-grade lymphoma for the regimen of R-CEOP (rituximab , vincristine, cyclophosphamide, IV etoposide  and prednisone ), planned duration for 6 cycles or until disease progression or unacceptable toxicity. Oral etoposide  will be given on day 2 and day 3 of the 21 day regimen.   Labs from 10/16/23 (CBC and BMP) assessed, no interventions needed. Per discussion with MD, we will continue at 50% dosage (50mg /m2) for the first cycle for tolerability, age, and bone marrow reserve. Prescription dose and frequency assessed for appropriateness.  Current medication list in Epic reviewed, no significant/ relevant DDIs with Etoposide  identified:  Evaluated chart and no patient barriers to medication adherence noted.   Patient currently inpatient expected to be discharged today with first treatment date of 10/20/23. Prescription has been sent to [email or web address] to see if they have any available assistance to help patient with cost.   Prescription has been e-scribed to the [facility] for benefits analysis and approval.  Oral Oncology Clinic will continue to follow for insurance authorization, copayment issues, initial counseling and start date.  Feige Lowdermilk, PharmD Hematology/Oncology Clinical Pharmacist [facility] Oral Chemotherapy Navigation Clinic [phone] 10/16/2023 9:37 AM

## 2023-10-16 NOTE — Discharge Summary (Signed)
 Physician Discharge Summary  Brewster Wolters Advani FMW:[id number] DOB: Nov 24, 1934 DOA: 10/13/2023  PCP: [name], MD  Admit date: 10/13/2023 Discharge date: 10/16/2023  Admitted From: Home Disposition: Home  Recommendations for Outpatient Follow-up:  Follow up with PCP in 1-2 weeks Follow-up with medical oncology, Dr. [name] to initiate chemotherapy on 10/20/2023 Outpatient follow-up with cardiology regarding mild aortic stenosis for further surveillance Continue prednisone  6 mg p.o. daily x 5 days postbiopsy Follow-up pathology from right neck mass biopsy that was pending at time of discharge  Home Health: No Equipment/Devices: None  Discharge Condition: Stable CODE STATUS: Full code Diet recommendation: Heart healthy diet  History of present illness:  JOCSAN MCGINLEY is a 89 y.o. 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 Medical oncology consulted and recommended open biopsy but ENT unable to do it here. IR consulted for multiple core biopsies along with Port-A-Cath placement.  Seen by medical oncology with plans to initiate chemotherapy on 10/20/2023.  Hospital course:  Low-grade follicular lymphoma Rapidly enlarging right neck mass Concern for high-grade lymphoma Patient presenting as a direct admission from oncology office due to rapidly enlarging right neck mass with concern for high-grade lymphoma.  CT neck with contrast 7/29: Very large and transfacial up to 12 cm long axis lymphomatous mass about the right mandible, corresponding to the large FDG avid tumor in June.  Adjacent abnormal level 1A lymph node in the midline is new or progressed since June. Medical oncology/Dr. [name] consulted and recommended open  biopsy but ENT/Dr. Soldatova indicated that they are unable to do that procedure here and will need transfer to a tertiary hospital for same.  IR consulted and underwent core biopsies and Port-A-Cath placement on 7/30.  TTE performed 7/29 with preserved LVEF.  Medical oncology plans initiation of chemotherapy outpatient and starting on 10/20/2023.  Continue prednisone  6 mg p.o. daily x 5 days postbiopsy.  Follow-up pathology from right neck mass biopsy pending at time of discharge.  Aortic  stenosis, mild Mild AR stenosis noted incidentally on TTE performed inpatient.  Recommend continued surveillance outpatient with cardiology.   Essential hypertension Continue atenolol  25 mg PO daily   Hyperlipidemia Continue pravastatin  40 mg PO daily   Chronic diastolic CHF, compensated Continue home Lasix  20 mg PO BID   CAD s/p remote stents Denies chest pain/angina.  No longer on Brilinta . Continue statin, atenolol    BPH Continue Proscar .   Anemia of chronic disease Hemoglobin stable.  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)   Chronic diastolic CHF (congestive heart failure), NYHA class 2 (HCC)   Diffuse large B-cell lymphoma of lymph nodes of multiple regions [facility]    Discharge Instructions  Discharge Instructions     Call MD for:  difficulty breathing, headache or visual disturbances   Complete by: As directed    Call MD for:  extreme fatigue   Complete by: As directed    Call MD for:  persistant dizziness or light-headedness   Complete by: As directed    Call MD for:  persistant nausea and vomiting   Complete by: As directed  Call MD for:  severe uncontrolled pain   Complete by: As directed    Call MD for:  temperature >100.4   Complete by: As directed    Diet - low sodium heart healthy   Complete by:  As directed    Increase activity slowly   Complete by: As directed    No wound care   Complete by: As directed    ONCBCN PHYSICIAN COMMUNICATION 1   Complete by: As directed    Pegfilgrastim recommended if patient age greater than 13.   PHYSICIAN COMMUNICATION ORDER   Complete by: As directed    Hepatitis B Virus screening with HBsAg and anti-HBc recommended prior to treatment with rituximab , ofatumumab, or obinutuzumab.   Clinic Appointment Request   Complete by: Oct 20, 2023    Contact your oncology clinic or infusion center to schedule this appointment.   Infusion Appointment Request (435 min)   Complete by: Oct 20, 2023    Contact your oncology clinic or infusion center to schedule this appointment.   Lab Appointment Request   Complete by: Oct 20, 2023    Patient requires a Lab or Flush Appointment?: Flush Appointment   Lab appointment MUST be scheduled prior to chemotherapy appointment.      Allergies as of 10/16/2023   No Known Allergies      Medication List     STOP taking these medications    Brilinta  90 MG Tabs tablet Generic drug: ticagrelor        TAKE these medications    acyclovir  400 MG tablet Commonly known as: ZOVIRAX  Take 1 tablet (400 mg total) by mouth daily.   allopurinol  100 MG tablet Commonly known as: Zyloprim  Take 1 tablet (100 mg total) by mouth 2 (two) times daily.   atenolol  25 MG tablet Commonly known as: TENORMIN  TAKE 1 TABLET BY MOUTH DAILY   B-12 2500 MCG Tabs Take 2,500 mcg by mouth daily.   brimonidine  0.2 % ophthalmic solution Commonly known as: ALPHAGAN  Place 1 drop into the right eye 2 (two) times daily.   cholecalciferol  1000 units tablet Commonly known as: VITAMIN D  Take 1,000 Units by mouth daily.   etoposide  50 MG capsule Commonly known as: VEPESID  Take 2 capsules (100 mg total) by mouth See admin instructions. On Day 2 and Day 3 of each cycle of R-CEOP   finasteride  5 MG tablet Commonly known as:  PROSCAR  Take 5 mg by mouth daily.   furosemide  20 MG tablet Commonly known as: LASIX  TAKE 1 TABLET BY MOUTH TWICE  DAILY   gabapentin 100 MG capsule Commonly known as: NEURONTIN Take 1 capsule by mouth 2 (two) times daily as needed (for knee pain).   lidocaine -prilocaine  cream Commonly known as: EMLA  Apply to affected area once   nitroGLYCERIN  0.4 MG SL tablet Commonly known as: Nitrostat  Place 1 tablet (0.4 mg total) under the tongue every 5 (five) minutes as needed for chest pain.   ondansetron  8 MG tablet Commonly known as: Zofran  Take 1 tablet (8 mg total) by mouth every 8 (eight) hours as needed for nausea or vomiting. Start on day 3 after cyclophosphamide chemotherapy.   potassium chloride  SA 20 MEQ tablet Commonly known as: KLOR-CON  M Take 1 tablet (20 mEq total) by mouth daily. Please keep upcoming appointment for future refills. Thank you. What changed:  how much to take when to take this additional instructions   pravastatin  40 MG tablet Commonly known as: PRAVACHOL  Take 1 tablet (40 mg total) by mouth  at bedtime.   predniSONE  20 MG tablet Commonly known as: DELTASONE  Take 3 tablets (60 mg total) by mouth daily. Take on days 2-5 of chemotherapy.   prochlorperazine  10 MG tablet Commonly known as: COMPAZINE  Take 1 tablet (10 mg total) by mouth every 6 (six) hours as needed for nausea or vomiting.        Follow-up Information     [name], MD. Schedule an appointment as soon as possible for a visit in 1 week(s).   Specialty: Family Medicine Contact information: [address] [facility] [address] [place] [address] [phone]         [name], MD. Go on 10/20/2023.   Specialties: Hematology, Oncology Contact information: [address] [place] [address] [phone]                No Known Allergies  Consultations: ENT, Dr. Okey Medical oncology, Dr. [name] Interventional  radiology   Procedures/Studies: ECHOCARDIOGRAM COMPLETE Result Date: 10/15/2023    ECHOCARDIOGRAM REPORT   Patient Name:   MARCQUIS RIDLON Date of Exam: 10/15/2023 Medical Rec #:  [id number]      Height:       66.0 in Accession #:    [id number]     Weight:       155.3 lb Date of Birth:  1934-04-25      BSA:          1.796 m Patient Age:    88 years       BP:           95/50 mmHg Patient Gender: M              HR:           68 bpm. Exam Location:  Inpatient Procedure: 2D Echo, 3D Echo, Strain Analysis, Cardiac Doppler and Color Doppler            (Both Spectral and Color Flow Doppler were utilized during            procedure). Indications:    CAD native vessel I25.10  History:        Patient has prior history of Echocardiogram examinations, most                 recent 01/29/2023. CAD and Previous Myocardial Infarction,                 Mitral Valve Disease; Risk Factors:Hypertension and                 Dyslipidemia.  Sonographer:    Koleen Popper RDCS Referring Phys: [phone] ANAND D HONGALGI  Sonographer Comments: Global longitudinal strain was attempted. IMPRESSIONS  1. Left ventricular ejection fraction, by estimation, is 60 to 65%. Left ventricular ejection fraction by 3D volume is 62 %. The left ventricle has normal function. The left ventricle has no regional wall motion abnormalities. Left ventricular diastolic  parameters are consistent with Grade II diastolic dysfunction (pseudonormalization). The average left ventricular global longitudinal strain is -19.9 %. The global longitudinal strain is normal.  2. Right ventricular systolic function is mildly reduced. The right ventricular size is mildly enlarged. There is mildly elevated pulmonary artery systolic pressure. The estimated right ventricular systolic pressure is 41.6 mmHg.  3. The mitral valve is normal in structure. Mild mitral valve regurgitation. Mild mitral stenosis. Severe mitral annular calcification.  4. The tricuspid valve is degenerative.  Tricuspid valve regurgitation is moderate.  5. The aortic  valve has an indeterminant number of cusps. There is severe calcifcation of the aortic valve. Aortic valve regurgitation is mild. Mild aortic valve stenosis. Aortic valve Vmax measures 2.17 m/s.  6. The inferior vena cava is dilated in size with <50% respiratory variability, suggesting right atrial pressure of 15 mmHg. FINDINGS  Left Ventricle: Left ventricular ejection fraction, by estimation, is 60 to 65%. Left ventricular ejection fraction by 3D volume is 62 %. The left ventricle has normal function. The left ventricle has no regional wall motion abnormalities. The average left ventricular global longitudinal strain is -19.9 %. Strain was performed and the global longitudinal strain is normal. The left ventricular internal cavity size was normal in size. There is no left ventricular hypertrophy. Left ventricular diastolic parameters are consistent with Grade II diastolic dysfunction (pseudonormalization). Right Ventricle: The right ventricular size is mildly enlarged. No increase in right ventricular wall thickness. Right ventricular systolic function is mildly reduced. There is mildly elevated pulmonary artery systolic pressure. The tricuspid regurgitant  velocity is 2.58 m/s, and with an assumed right atrial pressure of 15 mmHg, the estimated right ventricular systolic pressure is 41.6 mmHg. Left Atrium: Left atrial size was normal in size. Right Atrium: Right atrial size was normal in size. Pericardium: There is no evidence of pericardial effusion. Mitral Valve: The mitral valve is normal in structure. Severe mitral annular calcification. Mild mitral valve regurgitation. Mild mitral valve stenosis. MV peak gradient, 12.7 mmHg. The mean mitral valve gradient is 4.0 mmHg. Tricuspid Valve: The tricuspid valve is degenerative in appearance. Tricuspid valve regurgitation is moderate . No evidence of tricuspid stenosis. Aortic Valve: The aortic valve has an  indeterminant number of cusps. There is severe calcifcation of the aortic valve. Aortic valve regurgitation is mild. Mild aortic stenosis is present. Aortic valve mean gradient measures 11.0 mmHg. Aortic valve peak gradient measures 18.8 mmHg. Aortic valve area, by VTI measures 1.07 cm. Pulmonic Valve: The pulmonic valve was grossly normal. Pulmonic valve regurgitation is mild. No evidence of pulmonic stenosis. Aorta: The aortic root is normal in size and structure. Venous: The inferior vena cava is dilated in size with less than 50% respiratory variability, suggesting right atrial pressure of 15 mmHg. IAS/Shunts: No atrial level shunt detected by color flow Doppler. Additional Comments: 3D was performed not requiring image post processing on an independent workstation and was normal.  LEFT VENTRICLE PLAX 2D LVIDd:         3.60 cm         Diastology LVIDs:         2.30 cm         LV e' medial:    6.20 cm/s LV PW:         1.00 cm         LV E/e' medial:  18.9 LV IVS:        1.00 cm         LV e' lateral:   5.77 cm/s LVOT diam:     1.50 cm         LV E/e' lateral: 20.3 LV SV:         46 LV SV Index:   26              2D Longitudinal LVOT Area:     1.77 cm        Strain  2D Strain GLS   -19.9 %                                Avg:                                 3D Volume EF                                LV 3D EF:    Left                                             ventricul                                             ar                                             ejection                                             fraction                                             by 3D                                             volume is                                             62 %.                                 3D Volume EF:                                3D EF:        62 %                                LV EDV:       93 ml                                LV ESV:       35 ml  LV SV:        58 ml RIGHT VENTRICLE             IVC RV Basal diam:  4.30 cm     IVC diam: 2.40 cm RV Mid diam:    3.80 cm RV S prime:     14.90 cm/s TAPSE (M-mode): 2.0 cm LEFT ATRIUM             Index        RIGHT ATRIUM           Index LA diam:        3.30 cm 1.84 cm/m   RA Area:     12.80 cm LA Vol (A2C):   37.9 ml 21.10 ml/m  RA Volume:   29.90 ml  16.65 ml/m LA Vol (A4C):   35.3 ml 19.65 ml/m LA Biplane Vol: 39.5 ml 21.99 ml/m  AORTIC VALVE AV Area (Vmax):    1.07 cm AV Area (Vmean):   0.94 cm AV Area (VTI):     1.07 cm AV Vmax:           217.00 cm/s AV Vmean:          157.000 cm/s AV VTI:            0.432 m AV Peak Grad:      18.8 mmHg AV Mean Grad:      11.0 mmHg LVOT Vmax:         131.00 cm/s LVOT Vmean:        83.200 cm/s LVOT VTI:          0.261 m LVOT/AV VTI ratio: 0.60  AORTA Ao Root diam: 2.70 cm Ao Asc diam:  3.30 cm MITRAL VALVE                TRICUSPID VALVE MV Area (PHT): 2.16 cm     TR Peak grad:   26.6 mmHg MV Area VTI:   1.02 cm     TR Vmax:        258.00 cm/s MV Peak grad:  12.7 mmHg MV Mean grad:  4.0 mmHg     SHUNTS MV Vmax:       1.78 m/s     Systemic VTI:  0.26 m MV Vmean:      95.9 cm/s    Systemic Diam: 1.50 cm MV Decel Time: 351 msec MV E velocity: 117.00 cm/s MV A velocity: 149.00 cm/s MV E/A ratio:  0.79 Morene Brownie Electronically signed by Morene Brownie Signature Date/Time: 10/15/2023/6:24:54 PM    Final    IR IMAGING GUIDED PORT INSERTION Result Date: 10/15/2023 INDICATION: 89 year old male with a known history of low-grade follicular lymphoma with enlarging face all mass concerning for transformation to high-grade lymphoma. He presents for port catheter placement to establish durable venous access in preparation for chemotherapy as well as ultrasound-guided core biopsy of the facial mass. EXAM: IMPLANTED PORT A CATH PLACEMENT WITH ULTRASOUND AND FLUOROSCOPIC GUIDANCE ULTRASOUND-GUIDED CORE BIOPSY MEDICATIONS: None. ANESTHESIA/SEDATION: Versed  2 mg IV;  Fentanyl  100 mcg IV; administered by the radiology nurse Moderate Sedation Time:  42 minutes The patient's vital signs and level of consciousness were continuously monitored during the procedure by the interventional radiology nurse under my direct supervision. FLUOROSCOPY: Radiation exposure index: 3 mGy reference air kerma COMPLICATIONS: None immediate. PROCEDURE: The right neck and chest was prepped with chlorhexidine , and draped in the usual sterile fashion using maximum barrier technique (cap and mask, sterile gown, sterile gloves, large sterile  sheet, hand hygiene and cutaneous antiseptic). Local anesthesia was attained by infiltration with 1% lidocaine  with epinephrine . Ultrasound demonstrated patency of the right internal jugular vein, and this was documented with an image. Under real-time ultrasound guidance, this vein was accessed with a 21 gauge micropuncture needle and image documentation was performed. A small dermatotomy was made at the access site with an 11 scalpel. A 0.018 wire was advanced into the SVC and the access needle exchanged for a 38F micropuncture vascular sheath. The 0.018 wire was then removed and a 0.035 wire advanced into the IVC. An appropriate location for the subcutaneous reservoir was selected below the clavicle and an incision was made through the skin and underlying soft tissues. The subcutaneous tissues were then dissected using a combination of blunt and sharp surgical technique and a pocket was formed. A Bard Clear Vue single lumen power injectable portacatheter was then tunneled through the subcutaneous tissues from the pocket to the dermatotomy and the port reservoir placed within the subcutaneous pocket. The venous access site was then serially dilated and a peel away vascular sheath placed over the wire. The wire was removed and the port catheter advanced into position under fluoroscopic guidance. The catheter tip is positioned in the superior cavoatrial junction. This  was documented with a spot image. The portacatheter was then tested and found to flush and aspirate well. The port was flushed with saline followed by 100 units/mL heparinized saline. The pocket was then closed in two layers using first subdermal inverted interrupted absorbable sutures followed by a running subcuticular suture. The epidermis was then sealed with Dermabond. The dermatotomy at the venous access site was also closed with Dermabond. The port catheter was left accessed and sterile bandages were applied. Next, the facial mass was evaluated. The mass was interrogated with ultrasound. A suitable biopsy entry site was identified in the submental space allowing passage through relatively normal soft tissues in a region of relatively low vascularity within the mass. The skin was sterilely prepped and draped in the usual fashion using chlorhexidine  skin prep. Local anesthesia was attained by infiltration with 1% lidocaine . A small dermatotomy was made. Under real-time ultrasound guidance, multiple 18 gauge core biopsies were obtained using the Argon automated biopsy device. Biopsy specimens were placed on a moistened Telfa pad and submitted to pathology for further analysis. Post biopsy imaging demonstrates no evidence of active bleeding or other complication. IMPRESSION: Successful placement of a right IJ approach Bard Clear Vue port catheter with ultrasound and fluoroscopic guidance. The catheter is ready for use. Successful ultrasound-guided biopsy of right facial mass. Electronically Signed   By: Wilkie Lent M.D.   On: 10/15/2023 11:35   IR US  Guide Bx Asp/Drain Result Date: 10/15/2023 INDICATION: 89 year old male with a known history of low-grade follicular lymphoma with enlarging face all mass concerning for transformation to high-grade lymphoma. He presents for port catheter placement to establish durable venous access in preparation for chemotherapy as well as ultrasound-guided core biopsy of  the facial mass. EXAM: IMPLANTED PORT A CATH PLACEMENT WITH ULTRASOUND AND FLUOROSCOPIC GUIDANCE ULTRASOUND-GUIDED CORE BIOPSY MEDICATIONS: None. ANESTHESIA/SEDATION: Versed  2 mg IV; Fentanyl  100 mcg IV; administered by the radiology nurse Moderate Sedation Time:  42 minutes The patient's vital signs and level of consciousness were continuously monitored during the procedure by the interventional radiology nurse under my direct supervision. FLUOROSCOPY: Radiation exposure index: 3 mGy reference air kerma COMPLICATIONS: None immediate. PROCEDURE: The right neck and chest was prepped with chlorhexidine , and draped in the  usual sterile fashion using maximum barrier technique (cap and mask, sterile gown, sterile gloves, large sterile sheet, hand hygiene and cutaneous antiseptic). Local anesthesia was attained by infiltration with 1% lidocaine  with epinephrine . Ultrasound demonstrated patency of the right internal jugular vein, and this was documented with an image. Under real-time ultrasound guidance, this vein was accessed with a 21 gauge micropuncture needle and image documentation was performed. A small dermatotomy was made at the access site with an 11 scalpel. A 0.018 wire was advanced into the SVC and the access needle exchanged for a 65F micropuncture vascular sheath. The 0.018 wire was then removed and a 0.035 wire advanced into the IVC. An appropriate location for the subcutaneous reservoir was selected below the clavicle and an incision was made through the skin and underlying soft tissues. The subcutaneous tissues were then dissected using a combination of blunt and sharp surgical technique and a pocket was formed. A Bard Clear Vue single lumen power injectable portacatheter was then tunneled through the subcutaneous tissues from the pocket to the dermatotomy and the port reservoir placed within the subcutaneous pocket. The venous access site was then serially dilated and a peel away vascular sheath placed  over the wire. The wire was removed and the port catheter advanced into position under fluoroscopic guidance. The catheter tip is positioned in the superior cavoatrial junction. This was documented with a spot image. The portacatheter was then tested and found to flush and aspirate well. The port was flushed with saline followed by 100 units/mL heparinized saline. The pocket was then closed in two layers using first subdermal inverted interrupted absorbable sutures followed by a running subcuticular suture. The epidermis was then sealed with Dermabond. The dermatotomy at the venous access site was also closed with Dermabond. The port catheter was left accessed and sterile bandages were applied. Next, the facial mass was evaluated. The mass was interrogated with ultrasound. A suitable biopsy entry site was identified in the submental space allowing passage through relatively normal soft tissues in a region of relatively low vascularity within the mass. The skin was sterilely prepped and draped in the usual fashion using chlorhexidine  skin prep. Local anesthesia was attained by infiltration with 1% lidocaine . A small dermatotomy was made. Under real-time ultrasound guidance, multiple 18 gauge core biopsies were obtained using the Argon automated biopsy device. Biopsy specimens were placed on a moistened Telfa pad and submitted to pathology for further analysis. Post biopsy imaging demonstrates no evidence of active bleeding or other complication. IMPRESSION: Successful placement of a right IJ approach Bard Clear Vue port catheter with ultrasound and fluoroscopic guidance. The catheter is ready for use. Successful ultrasound-guided biopsy of right facial mass. Electronically Signed   By: Wilkie Lent M.D.   On: 10/15/2023 11:35   CT SOFT TISSUE NECK W CONTRAST Result Date: 10/14/2023 CLINICAL DATA:  89 year old male with a rapidly enlarging right face mass and history of follicular lymphoma. EXAM: CT NECK WITH  CONTRAST TECHNIQUE: Multidetector CT imaging of the neck was performed using the standard protocol following the bolus administration of intravenous contrast. RADIATION DOSE REDUCTION: This exam was performed according to the departmental dose-optimization program which includes automated exposure control, adjustment of the mA and/or kV according to patient size and/or use of iterative reconstruction technique. CONTRAST:  75mL OMNIPAQUE  IOHEXOL  300 MG/ML  SOLN COMPARISON:  Face/neck soft tissue ultrasound 08/27/2023. PET-CT 08/25/2023. Neck CT 04/13/2020. FINDINGS: Pharynx and larynx: Larynx and pharynx soft tissue contours, mucosal enhancement remain within normal limits. Negative parapharyngeal  and retropharyngeal spaces. Salivary glands: Large, amorphous, relatively homogeneous trans spatial soft tissue mass about the right mandible body and ramus corresponding to similar appearing mass with intense FDG uptake on June PET-CT. The bulk of the mass is superficial to the mandible oil as in June. This mass with indistinct margins encompasses 95 x 44 by 124 mm (AP by transverse by CC) subcutaneous and dermis involvement. Some involvement of the right lower masticator space. Extension into the right submandibular space with mild mass effect on the right submandibular gland (series 2, image 62). Relatively spared left sublingual space. Left anterior inferior parotid space mildly affected. Contralateral left submandibular, masticator and parotid spaces are negative. Thyroid : Negative. Lymph nodes: Large superficial right face mass described above. Associated contiguous abnormal right level 1A lymph node in the midline which is 18 mm short axis on series 2, image 73. This is new or progressed since 08/25/2023. But little if any additional cervical lymphadenopathy, right level 2 nodes remain subcentimeter. Vascular: Both internal jugular veins in the neck appear partially effaced but remain patent. Other major vascular  structures in the bilateral neck and at the skull base are patent. Right greater than left carotid bifurcation atherosclerosis. Limited intracranial: Negative for age. Visualized orbits: Stable since 2022 and negative. Mastoids and visualized paranasal sinuses: Visualized paranasal sinuses and mastoids are stable and well aerated. Skeleton: Despite the bulky adjacent soft tissue mass the right mandible appears to remain intact and normally located. And no acute or suspicious osseous lesion is identified, with chronic cervical spine degeneration. Upper chest: Bilateral lung apex and subpleural mild lung scarring. No visible superior mediastinal or axillary lymphadenopathy. IMPRESSION: 1. 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 Adjacent abnormal level 1A lymph node in the midline is new or progressed since June. 2. But no other lymphadenopathy in the Neck or at the thoracic inlet. And the underlying right mandible remains intact. Electronically Signed   By: VEAR Hurst M.D.   On: 10/14/2023 13:16     Subjective: Patient seen examined bedside, lying in bed.  No complaints this morning.  Discussed with medical oncology, Dr. [name]; okay for discharge home today with prednisone  60 mg p.o. daily x 5 days with planned initiation of outpatient chemotherapy on 10/20/2023.  Patient with no other specific complaints, concerns or questions at this time.  Denies headache, no dizziness, no chest pain, no palpitations, no shortness of breath, no abdominal pain, no fever/chills/night sweats, no nausea/vomiting/diarrhea, no focal weakness, no fatigue, no paresthesia.  No acute events overnight per nursing staff.  Discharge Exam: Vitals:   10/15/23 2020 10/16/23 0621  BP: (!) 97/51 (!) 107/55  Pulse: 75 77  Resp: 18 18  Temp: 97.8 F (36.6 C) 97.7 F (36.5 C)  SpO2: 100% 100%   Vitals:   10/15/23 1308 10/15/23 1310  10/15/23 2020 10/16/23 0621  BP: (!) 95/50 (!) 95/50 (!) 97/51 (!) 107/55  Pulse: 76 69 75 77  Resp:  20 18 18   Temp: 97.6 F (36.4 C) 97.6 F (36.4 C) 97.8 F (36.6 C) 97.7 F (36.5 C)  TempSrc: Axillary Oral Oral Oral  SpO2: 100% 100% 100% 100%  Weight:      Height:        Physical Exam: GEN: NAD, alert and oriented x 3, elderly/chronically ill in appearance HEENT: Large lobulated mass right neck/lower face as detailed in picture below, nontender to  palpation, nonpulsatile. PULM: CTAB w/o wheezes/crackles, normal respiratory effort CV: RRR w/o M/G/R GI: abd soft, NTND, + BS MSK: no peripheral edema, muscle strength globally intact 5/5 bilateral upper/lower extremities NEURO: CN II-XII intact, no focal deficits, sensation to light touch intact PSYCH: normal mood/affect Integumentary: dry/intact, no rashes or wounds       The results of significant diagnostics from this hospitalization (including imaging, microbiology, ancillary and laboratory) are listed below for reference.     Microbiology: No results found for this or any previous visit (from the past 240 hours).   Labs: BNP (last 3 results) Recent Labs    01/28/23 2326  BNP 482.6*   Basic Metabolic Panel: Recent Labs  Lab 10/13/23 1212 10/16/23 0700  NA 143 139  K 3.6 4.3  CL 103 101  CO2 35* 27  GLUCOSE 85 157*  BUN 22 22  CREATININE 1.00 0.72  CALCIUM 8.9 8.9  MG  --  1.9   Liver Function Tests: Recent Labs  Lab 10/13/23 1212  AST 15  ALT 10  ALKPHOS 53  BILITOT 0.5  PROT 6.1*  ALBUMIN 3.6   No results for input(s): LIPASE, AMYLASE in the last 168 hours. No results for input(s): AMMONIA in the last 168 hours. CBC: Recent Labs  Lab 10/13/23 1212 10/16/23 0700  WBC 5.0 4.9  NEUTROABS 2.8  --   HGB 11.0* 10.6*  HCT 34.2* 34.6*  MCV 85.7 88.3  PLT 269 260   Cardiac Enzymes: No results for input(s): CKTOTAL, CKMB, CKMBINDEX, TROPONINI in the last 168  hours. BNP: Invalid input(s): POCBNP CBG: No results for input(s): GLUCAP in the last 168 hours. D-Dimer No results for input(s): DDIMER in the last 72 hours. Hgb A1c No results for input(s): HGBA1C in the last 72 hours. Lipid Profile No results for input(s): CHOL, HDL, LDLCALC, TRIG, CHOLHDL, LDLDIRECT in the last 72 hours. Thyroid  function studies No results for input(s): TSH, T4TOTAL, T3FREE, THYROIDAB in the last 72 hours.  Invalid input(s): FREET3 Anemia work up No results for input(s): VITAMINB12, FOLATE, FERRITIN, TIBC, IRON, RETICCTPCT in the last 72 hours. Urinalysis No results found for: COLORURINE, APPEARANCEUR, LABSPEC, PHURINE, GLUCOSEU, HGBUR, BILIRUBINUR, KETONESUR, PROTEINUR, UROBILINOGEN, NITRITE, LEUKOCYTESUR Sepsis Labs Recent Labs  Lab 10/13/23 1212 10/16/23 0700  WBC 5.0 4.9   Microbiology No results found for this or any previous visit (from the past 240 hours).   Time coordinating discharge: Over 30 minutes  SIGNED:   Camellia PARAS [place], DO  [facility] 10/16/2023, 10:20 AM

## 2023-10-16 NOTE — Progress Notes (Signed)
 Hematology Short Note  Patients ECHO noted. Patient started on Prednisone  post biopsy. Patient has been scheduled to start R-CEOP at outpatient at cancer center on 8/4. Will f/u biopsy as outpatient. Okay to discharge home today on Prednisone  60mg  daily x 5 days from hematology standpoint.  Chastity Noland MD MS

## 2023-10-16 NOTE — Plan of Care (Signed)

## 2023-10-17 ENCOUNTER — Other Ambulatory Visit: Payer: Self-pay

## 2023-10-17 MED ORDER — ETOPOSIDE 50 MG PO CAPS: 100.0000 mg | ORAL_CAPSULE | ORAL | 0 refills | Status: DC

## 2023-10-18 ENCOUNTER — Emergency Department (HOSPITAL_COMMUNITY)

## 2023-10-18 ENCOUNTER — Other Ambulatory Visit: Payer: Self-pay

## 2023-10-18 ENCOUNTER — Inpatient Hospital Stay (HOSPITAL_COMMUNITY)
Admission: EM | Admit: 2023-10-18 | Discharge: 2023-10-25 | DRG: 280 | Disposition: A | Discharge status: Home / Self Care | Attending: Internal Medicine | Admitting: Internal Medicine

## 2023-10-18 ENCOUNTER — Inpatient Hospital Stay (HOSPITAL_COMMUNITY)

## 2023-10-18 ENCOUNTER — Encounter (HOSPITAL_COMMUNITY): Payer: Self-pay | Admitting: Emergency Medicine

## 2023-10-18 DIAGNOSIS — Z1152 Encounter for screening for COVID-19: Secondary | ICD-10-CM | POA: Diagnosis not present

## 2023-10-18 DIAGNOSIS — A419 Sepsis, unspecified organism: Principal | ICD-10-CM | POA: Diagnosis present

## 2023-10-18 DIAGNOSIS — R579 Shock, unspecified: Secondary | ICD-10-CM

## 2023-10-18 DIAGNOSIS — R7881 Bacteremia: Secondary | ICD-10-CM

## 2023-10-18 DIAGNOSIS — I890 Lymphedema, not elsewhere classified: Secondary | ICD-10-CM | POA: Diagnosis not present

## 2023-10-18 DIAGNOSIS — Y838 Other surgical procedures as the cause of abnormal reaction of the patient, or of later complication, without mention of misadventure at the time of the procedure: Secondary | ICD-10-CM | POA: Diagnosis present

## 2023-10-18 DIAGNOSIS — C8338 Diffuse large B-cell lymphoma, lymph nodes of multiple sites: Secondary | ICD-10-CM

## 2023-10-18 DIAGNOSIS — Z85820 Personal history of malignant melanoma of skin: Secondary | ICD-10-CM

## 2023-10-18 DIAGNOSIS — R509 Fever, unspecified: Secondary | ICD-10-CM | POA: Diagnosis not present

## 2023-10-18 DIAGNOSIS — Z66 Do not resuscitate: Secondary | ICD-10-CM | POA: Diagnosis present

## 2023-10-18 DIAGNOSIS — E876 Hypokalemia: Secondary | ICD-10-CM | POA: Diagnosis present

## 2023-10-18 DIAGNOSIS — I110 Hypertensive heart disease with heart failure: Secondary | ICD-10-CM | POA: Diagnosis present

## 2023-10-18 DIAGNOSIS — B951 Streptococcus, group B, as the cause of diseases classified elsewhere: Secondary | ICD-10-CM

## 2023-10-18 DIAGNOSIS — Z79899 Other long term (current) drug therapy: Secondary | ICD-10-CM

## 2023-10-18 DIAGNOSIS — L7632 Postprocedural hematoma of skin and subcutaneous tissue following other procedure: Secondary | ICD-10-CM | POA: Diagnosis present

## 2023-10-18 DIAGNOSIS — H547 Unspecified visual loss: Secondary | ICD-10-CM | POA: Diagnosis present

## 2023-10-18 DIAGNOSIS — I82452 Acute embolism and thrombosis of left peroneal vein: Secondary | ICD-10-CM | POA: Diagnosis present

## 2023-10-18 DIAGNOSIS — C8208 Follicular lymphoma grade I, lymph nodes of multiple sites: Secondary | ICD-10-CM | POA: Diagnosis not present

## 2023-10-18 DIAGNOSIS — R6521 Severe sepsis with septic shock: Secondary | ICD-10-CM | POA: Diagnosis present

## 2023-10-18 DIAGNOSIS — R112 Nausea with vomiting, unspecified: Secondary | ICD-10-CM

## 2023-10-18 DIAGNOSIS — L03115 Cellulitis of right lower limb: Secondary | ICD-10-CM | POA: Diagnosis present

## 2023-10-18 DIAGNOSIS — C8330 Diffuse large B-cell lymphoma, unspecified site: Secondary | ICD-10-CM | POA: Diagnosis present

## 2023-10-18 DIAGNOSIS — T80211A Bloodstream infection due to central venous catheter, initial encounter: Principal | ICD-10-CM | POA: Diagnosis present

## 2023-10-18 DIAGNOSIS — I272 Pulmonary hypertension, unspecified: Secondary | ICD-10-CM | POA: Diagnosis present

## 2023-10-18 DIAGNOSIS — E785 Hyperlipidemia, unspecified: Secondary | ICD-10-CM | POA: Diagnosis present

## 2023-10-18 DIAGNOSIS — A401 Sepsis due to streptococcus, group B: Secondary | ICD-10-CM | POA: Diagnosis present

## 2023-10-18 DIAGNOSIS — I5033 Acute on chronic diastolic (congestive) heart failure: Secondary | ICD-10-CM | POA: Diagnosis present

## 2023-10-18 DIAGNOSIS — E872 Acidosis, unspecified: Secondary | ICD-10-CM | POA: Diagnosis present

## 2023-10-18 DIAGNOSIS — C8518 Unspecified B-cell lymphoma, lymph nodes of multiple sites: Secondary | ICD-10-CM

## 2023-10-18 DIAGNOSIS — M7989 Other specified soft tissue disorders: Secondary | ICD-10-CM

## 2023-10-18 DIAGNOSIS — Z5111 Encounter for antineoplastic chemotherapy: Secondary | ICD-10-CM

## 2023-10-18 DIAGNOSIS — I21A1 Myocardial infarction type 2: Secondary | ICD-10-CM | POA: Diagnosis present

## 2023-10-18 DIAGNOSIS — M79604 Pain in right leg: Secondary | ICD-10-CM | POA: Diagnosis present

## 2023-10-18 DIAGNOSIS — R079 Chest pain, unspecified: Secondary | ICD-10-CM

## 2023-10-18 DIAGNOSIS — C8290 Follicular lymphoma, unspecified, unspecified site: Secondary | ICD-10-CM | POA: Diagnosis present

## 2023-10-18 DIAGNOSIS — N400 Enlarged prostate without lower urinary tract symptoms: Secondary | ICD-10-CM | POA: Diagnosis present

## 2023-10-18 DIAGNOSIS — I5032 Chronic diastolic (congestive) heart failure: Secondary | ICD-10-CM | POA: Diagnosis present

## 2023-10-18 DIAGNOSIS — I82402 Acute embolism and thrombosis of unspecified deep veins of left lower extremity: Secondary | ICD-10-CM | POA: Diagnosis not present

## 2023-10-18 DIAGNOSIS — I10 Essential (primary) hypertension: Secondary | ICD-10-CM | POA: Diagnosis not present

## 2023-10-18 DIAGNOSIS — I2510 Atherosclerotic heart disease of native coronary artery without angina pectoris: Secondary | ICD-10-CM | POA: Diagnosis present

## 2023-10-18 DIAGNOSIS — R578 Other shock: Secondary | ICD-10-CM | POA: Diagnosis not present

## 2023-10-18 DIAGNOSIS — E7800 Pure hypercholesterolemia, unspecified: Secondary | ICD-10-CM

## 2023-10-18 DIAGNOSIS — Z86718 Personal history of other venous thrombosis and embolism: Secondary | ICD-10-CM

## 2023-10-18 DIAGNOSIS — C8218 Follicular lymphoma grade II, lymph nodes of multiple sites: Secondary | ICD-10-CM

## 2023-10-18 DIAGNOSIS — H409 Unspecified glaucoma: Secondary | ICD-10-CM | POA: Diagnosis present

## 2023-10-18 DIAGNOSIS — D630 Anemia in neoplastic disease: Secondary | ICD-10-CM | POA: Diagnosis present

## 2023-10-18 DIAGNOSIS — E878 Other disorders of electrolyte and fluid balance, not elsewhere classified: Secondary | ICD-10-CM | POA: Diagnosis not present

## 2023-10-18 DIAGNOSIS — R7989 Other specified abnormal findings of blood chemistry: Secondary | ICD-10-CM

## 2023-10-18 DIAGNOSIS — M109 Gout, unspecified: Secondary | ICD-10-CM | POA: Diagnosis present

## 2023-10-18 DIAGNOSIS — I2583 Coronary atherosclerosis due to lipid rich plaque: Secondary | ICD-10-CM

## 2023-10-18 DIAGNOSIS — I214 Non-ST elevation (NSTEMI) myocardial infarction: Secondary | ICD-10-CM | POA: Diagnosis not present

## 2023-10-18 DIAGNOSIS — Z95828 Presence of other vascular implants and grafts: Secondary | ICD-10-CM

## 2023-10-18 DIAGNOSIS — R220 Localized swelling, mass and lump, head: Secondary | ICD-10-CM | POA: Diagnosis not present

## 2023-10-18 DIAGNOSIS — Z955 Presence of coronary angioplasty implant and graft: Secondary | ICD-10-CM

## 2023-10-18 DIAGNOSIS — I82442 Acute embolism and thrombosis of left tibial vein: Secondary | ICD-10-CM | POA: Diagnosis present

## 2023-10-18 DIAGNOSIS — I252 Old myocardial infarction: Secondary | ICD-10-CM

## 2023-10-18 DIAGNOSIS — C8308 Small cell B-cell lymphoma, lymph nodes of multiple sites: Secondary | ICD-10-CM | POA: Diagnosis not present

## 2023-10-18 DIAGNOSIS — Z85828 Personal history of other malignant neoplasm of skin: Secondary | ICD-10-CM

## 2023-10-18 DIAGNOSIS — Z7982 Long term (current) use of aspirin: Secondary | ICD-10-CM

## 2023-10-18 DIAGNOSIS — Z8249 Family history of ischemic heart disease and other diseases of the circulatory system: Secondary | ICD-10-CM

## 2023-10-18 LAB — RESP PANEL BY RT-PCR (RSV, FLU A&B, COVID)  RVPGX2
Influenza A by PCR: NEGATIVE
Influenza B by PCR: NEGATIVE
Resp Syncytial Virus by PCR: NEGATIVE
SARS Coronavirus 2 by RT PCR: NEGATIVE

## 2023-10-18 LAB — TROPONIN I (HIGH SENSITIVITY)
Troponin I (High Sensitivity): 18 ng/L — ABNORMAL HIGH (ref <18)
Troponin I (High Sensitivity): 48 ng/L — ABNORMAL HIGH (ref <18)
Troponin I (High Sensitivity): 421 ng/L (ref <18)
Troponin I (High Sensitivity): 610 ng/L (ref <18)
Troponin I (High Sensitivity): 735 ng/L (ref <18)
Troponin I (High Sensitivity): 575 ng/L (ref <18)

## 2023-10-18 LAB — BLOOD CULTURE ID PANEL (REFLEXED) - BCID2

## 2023-10-18 LAB — I-STAT CG4 LACTIC ACID, ED
Lactic Acid, Venous: 2.6 mmol/L (ref 0.5–1.9)
Lactic Acid, Venous: 2.7 mmol/L (ref 0.5–1.9)
Lactic Acid, Venous: 13.4 mmol/L (ref 0.5–1.9)
Lactic Acid, Venous: 2.6 mmol/L (ref 0.5–1.9)

## 2023-10-18 LAB — CBC WITH DIFFERENTIAL/PLATELET
Abs Immature Granulocytes: 0.06 K/uL (ref 0.00–0.07)
Basophils Absolute: 0 K/uL (ref 0.0–0.1)
Basophils Relative: 0 %
Eosinophils Absolute: 0 K/uL (ref 0.0–0.5)
Eosinophils Relative: 0 %
HCT: 36.5 % — ABNORMAL LOW (ref 39.0–52.0)
Hemoglobin: 11.7 g/dL — ABNORMAL LOW (ref 13.0–17.0)
Immature Granulocytes: 1 %
Lymphocytes Relative: 4 %
Lymphs Abs: 0.4 K/uL — ABNORMAL LOW (ref 0.7–4.0)
MCH: 28 pg (ref 26.0–34.0)
MCHC: 32.1 g/dL (ref 30.0–36.0)
MCV: 87.3 fL (ref 80.0–100.0)
Monocytes Absolute: 0.3 K/uL (ref 0.1–1.0)
Monocytes Relative: 3 %
Neutro Abs: 8.7 K/uL — ABNORMAL HIGH (ref 1.7–7.7)
Neutrophils Relative %: 92 %
Platelets: 235 K/uL (ref 150–400)
RBC: 4.18 MIL/uL — ABNORMAL LOW (ref 4.22–5.81)
RDW: 14.7 % (ref 11.5–15.5)
WBC: 9.5 K/uL (ref 4.0–10.5)
nRBC: 0 % (ref 0.0–0.2)

## 2023-10-18 LAB — BASIC METABOLIC PANEL WITH GFR
Anion gap: 14 (ref 5–15)
BUN: 35 mg/dL — ABNORMAL HIGH (ref 8–23)
CO2: 26 mmol/L (ref 22–32)
Calcium: 9.5 mg/dL (ref 8.9–10.3)
Chloride: 101 mmol/L (ref 98–111)
Creatinine, Ser: 0.77 mg/dL (ref 0.61–1.24)
GFR, Estimated: >60 mL/min (ref >60)
Glucose, Bld: 99 mg/dL (ref 70–99)
Potassium: 3.3 mmol/L — ABNORMAL LOW (ref 3.5–5.1)
Sodium: 141 mmol/L (ref 135–145)

## 2023-10-18 LAB — CORTISOL: Cortisol, Plasma: 17.8 ug/dL

## 2023-10-18 LAB — URINALYSIS, ROUTINE W REFLEX MICROSCOPIC
Bilirubin Urine: NEGATIVE
Glucose, UA: NEGATIVE mg/dL
Hgb urine dipstick: NEGATIVE
Ketones, ur: NEGATIVE mg/dL
Leukocytes,Ua: NEGATIVE
Nitrite: NEGATIVE
Protein, ur: NEGATIVE mg/dL
Specific Gravity, Urine: 1.017 (ref 1.005–1.030)
pH: 5 (ref 5.0–8.0)

## 2023-10-18 LAB — PROTIME-INR
INR: 1.2 (ref 0.8–1.2)
Prothrombin Time: 16.1 s — ABNORMAL HIGH (ref 11.4–15.2)

## 2023-10-18 LAB — APTT: aPTT: 31 s (ref 24–36)

## 2023-10-18 LAB — BRAIN NATRIURETIC PEPTIDE: B Natriuretic Peptide: 625 pg/mL — ABNORMAL HIGH (ref 0.0–100.0)

## 2023-10-18 LAB — LACTIC ACID, PLASMA: Lactic Acid, Venous: 2.2 mmol/L (ref 0.5–1.9)

## 2023-10-18 LAB — MRSA NEXT GEN BY PCR, NASAL: MRSA by PCR Next Gen: NOT DETECTED

## 2023-10-18 MED ORDER — LACTATED RINGERS IV BOLUS (SEPSIS)
1000.0000 mL | Freq: Once | INTRAVENOUS | Status: AC
  Administered 2023-10-18: 1000 mL via INTRAVENOUS

## 2023-10-18 MED ORDER — NOREPINEPHRINE 4 MG/250ML-% IV SOLN: 0.0000 ug/min | INTRAVENOUS | Status: DC

## 2023-10-18 MED ORDER — PRAVASTATIN SODIUM 20 MG PO TABS
40.0000 mg | ORAL_TABLET | Freq: Every day | ORAL | Status: DC
  Administered 2023-10-18 – 2023-10-24 (×7): 40 mg via ORAL
  Filled 2023-10-18 (×7): qty 2

## 2023-10-18 MED ORDER — SODIUM CHLORIDE 0.9 % IV SOLN: 250.0000 mL | INTRAVENOUS | Status: AC

## 2023-10-18 MED ORDER — ONDANSETRON HCL 4 MG PO TABS: 8.0000 mg | ORAL_TABLET | Freq: Three times a day (TID) | ORAL | Status: DC | PRN

## 2023-10-18 MED ORDER — VITAMIN B-12 1000 MCG PO TABS
2500.0000 ug | ORAL_TABLET | Freq: Every day | ORAL | Status: DC
  Administered 2023-10-18 – 2023-10-25 (×8): 2500 ug via ORAL
  Filled 2023-10-18 (×3): qty 3
  Filled 2023-10-18: qty 2.5
  Filled 2023-10-18 (×5): qty 3

## 2023-10-18 MED ORDER — HEPARIN SODIUM (PORCINE) 5000 UNIT/ML IJ SOLN
5000.0000 [IU] | Freq: Three times a day (TID) | INTRAMUSCULAR | Status: DC
  Administered 2023-10-18: 5000 [IU] via SUBCUTANEOUS
  Filled 2023-10-18: qty 1

## 2023-10-18 MED ORDER — CHLORHEXIDINE GLUCONATE CLOTH 2 % EX PADS
6.0000 | MEDICATED_PAD | Freq: Every day | CUTANEOUS | Status: DC
  Administered 2023-10-18 – 2023-10-25 (×7): 6 via TOPICAL

## 2023-10-18 MED ORDER — SODIUM CHLORIDE 0.9 % IV SOLN
2.0000 g | Freq: Once | INTRAVENOUS | Status: AC
  Administered 2023-10-18: 2 g via INTRAVENOUS
  Filled 2023-10-18: qty 12.5

## 2023-10-18 MED ORDER — PANTOPRAZOLE SODIUM 40 MG PO TBEC
40.0000 mg | DELAYED_RELEASE_TABLET | Freq: Every day | ORAL | Status: DC
  Administered 2023-10-18 – 2023-10-25 (×8): 40 mg via ORAL
  Filled 2023-10-18 (×8): qty 1

## 2023-10-18 MED ORDER — VANCOMYCIN HCL 1250 MG/250ML IV SOLN
1250.0000 mg | INTRAVENOUS | Status: DC
  Administered 2023-10-18: 1250 mg via INTRAVENOUS
  Filled 2023-10-18: qty 250

## 2023-10-18 MED ORDER — POLYETHYLENE GLYCOL 3350 17 G PO PACK: 17.0000 g | PACK | Freq: Every day | ORAL | Status: DC | PRN

## 2023-10-18 MED ORDER — DOCUSATE SODIUM 100 MG PO CAPS: 100.0000 mg | ORAL_CAPSULE | Freq: Two times a day (BID) | ORAL | Status: DC | PRN

## 2023-10-18 MED ORDER — VANCOMYCIN HCL 1500 MG/300ML IV SOLN
1500.0000 mg | Freq: Once | INTRAVENOUS | Status: AC
  Administered 2023-10-18: 1500 mg via INTRAVENOUS
  Filled 2023-10-18: qty 300

## 2023-10-18 MED ORDER — LACTATED RINGERS IV SOLN: INTRAVENOUS | Status: AC

## 2023-10-18 MED ORDER — NOREPINEPHRINE 4 MG/250ML-% IV SOLN
0.0000 ug/min | INTRAVENOUS | Status: DC
  Administered 2023-10-18 – 2023-10-19 (×2): 2 ug/min via INTRAVENOUS
  Filled 2023-10-18 (×2): qty 250

## 2023-10-18 MED ORDER — ORAL CARE MOUTH RINSE
15.0000 mL | OROMUCOSAL | Status: DC | PRN
Start: 2023-10-18 — End: 2023-10-25

## 2023-10-18 MED ORDER — VANCOMYCIN HCL 1250 MG/250ML IV SOLN
1250.0000 mg | INTRAVENOUS | Status: DC
  Administered 2023-10-19: 1250 mg via INTRAVENOUS
  Filled 2023-10-18: qty 250

## 2023-10-18 MED ORDER — LACTATED RINGERS IV BOLUS (SEPSIS)
250.0000 mL | Freq: Once | INTRAVENOUS | Status: AC
  Administered 2023-10-18: 250 mL via INTRAVENOUS

## 2023-10-18 MED ORDER — ACYCLOVIR 400 MG PO TABS
400.0000 mg | ORAL_TABLET | Freq: Every day | ORAL | Status: DC
  Administered 2023-10-18 – 2023-10-25 (×8): 400 mg via ORAL
  Filled 2023-10-18 (×8): qty 1

## 2023-10-18 MED ORDER — VANCOMYCIN HCL IN DEXTROSE 1-5 GM/200ML-% IV SOLN: 1000.0000 mg | Freq: Once | INTRAVENOUS | Status: DC

## 2023-10-18 MED ORDER — HEPARIN (PORCINE) 25000 UT/250ML-% IV SOLN
1600.0000 [IU]/h | INTRAVENOUS | Status: DC
  Administered 2023-10-18: 850 [IU]/h via INTRAVENOUS
  Administered 2023-10-19: 1250 [IU]/h via INTRAVENOUS
  Administered 2023-10-20: 1400 [IU]/h via INTRAVENOUS
  Administered 2023-10-21 (×2): 1550 [IU]/h via INTRAVENOUS
  Administered 2023-10-22: 1600 [IU]/h via INTRAVENOUS
  Filled 2023-10-18 (×6): qty 250

## 2023-10-18 MED ORDER — VITAMIN D 25 MCG (1000 UNIT) PO TABS
1000.0000 [IU] | ORAL_TABLET | Freq: Every day | ORAL | Status: DC
  Administered 2023-10-18 – 2023-10-25 (×8): 1000 [IU] via ORAL
  Filled 2023-10-18 (×9): qty 1

## 2023-10-18 MED ORDER — GABAPENTIN 100 MG PO CAPS
100.0000 mg | ORAL_CAPSULE | Freq: Two times a day (BID) | ORAL | Status: DC | PRN
  Administered 2023-10-23: 100 mg via ORAL
  Filled 2023-10-18: qty 1

## 2023-10-18 MED ORDER — ALLOPURINOL 100 MG PO TABS
100.0000 mg | ORAL_TABLET | Freq: Two times a day (BID) | ORAL | Status: DC
  Administered 2023-10-18 – 2023-10-25 (×15): 100 mg via ORAL
  Filled 2023-10-18 (×15): qty 1

## 2023-10-18 MED ORDER — SODIUM CHLORIDE 0.9 % IV SOLN
2.0000 g | Freq: Two times a day (BID) | INTRAVENOUS | Status: DC
  Administered 2023-10-18 (×2): 2 g via INTRAVENOUS

## 2023-10-18 MED ORDER — CEFAZOLIN SODIUM-DEXTROSE 2-4 GM/100ML-% IV SOLN
2.0000 g | Freq: Three times a day (TID) | INTRAVENOUS | Status: DC
  Administered 2023-10-19 – 2023-10-20 (×4): 2 g via INTRAVENOUS
  Filled 2023-10-18 (×4): qty 100

## 2023-10-18 MED ORDER — BRIMONIDINE TARTRATE 0.2 % OP SOLN
1.0000 [drp] | Freq: Two times a day (BID) | OPHTHALMIC | Status: DC
  Administered 2023-10-18 – 2023-10-25 (×15): 1 [drp] via OPHTHALMIC
  Filled 2023-10-18: qty 5

## 2023-10-18 MED ORDER — FINASTERIDE 5 MG PO TABS
5.0000 mg | ORAL_TABLET | Freq: Every day | ORAL | Status: DC
  Administered 2023-10-18 – 2023-10-25 (×8): 5 mg via ORAL
  Filled 2023-10-18 (×8): qty 1

## 2023-10-18 NOTE — ED Provider Notes (Addendum)
 [place] EMERGENCY DEPARTMENT AT [facility] Provider Note   CSN: [id number] Arrival date & time: 10/18/23  0147     History Chief Complaint  Patient presents with   Chest Pain   Fever    HPI Harold [name] is a 89 y.o. male presenting for chief complaint of chest pain and fevers. States that he is on chemo for NHL Started having rigors earlier in the night Denies SOB/Cough Denies Urinary sxs Has Central line  Patient's recorded medical, surgical, social, medication list and allergies were reviewed in the Snapshot window as part of the initial history.   Review of Systems   Review of Systems  Constitutional:  Positive for chills, fatigue and fever.  HENT:  Negative for ear pain and sore throat.   Eyes:  Negative for pain and visual disturbance.  Respiratory:  Negative for cough and shortness of breath.   Cardiovascular:  Negative for chest pain and palpitations.  Gastrointestinal:  Positive for nausea. Negative for abdominal pain and vomiting.  Genitourinary:  Negative for dysuria and hematuria.  Musculoskeletal:  Negative for arthralgias and back pain.  Skin:  Negative for color change and rash.  Neurological:  Positive for weakness. Negative for seizures and syncope.  All other systems reviewed and are negative.   Physical Exam Updated Vital Signs BP (!) 95/50   Pulse 83   Temp 98.9 F (37.2 C) (Oral)   Resp 16   Ht 5' 6 (1.676 m)   Wt 70.3 kg   SpO2 99%   BMI 25.02 kg/m  Physical Exam Vitals and nursing note reviewed.  Constitutional:      General: He is not in acute distress.    Appearance: He is well-developed.  HENT:     Head: Normocephalic and atraumatic.  Eyes:     Conjunctiva/sclera: Conjunctivae normal.  Cardiovascular:     Rate and Rhythm: Normal rate and regular rhythm.     Heart sounds: No murmur heard. Pulmonary:     Effort: Pulmonary effort is normal. No respiratory distress.     Breath sounds: Normal breath sounds.   Abdominal:     Palpations: Abdomen is soft.     Tenderness: There is no abdominal tenderness.  Musculoskeletal:        General: No swelling.     Cervical back: Neck supple.  Skin:    General: Skin is warm and dry.     Capillary Refill: Capillary refill takes less than 2 seconds.  Neurological:     Mental Status: He is alert.  Psychiatric:        Mood and Affect: Mood normal.      ED Course/ Medical Decision Making/ A&P Clinical Course as of 10/18/23 9388  Sat Oct 18, 2023  0608 Signed out to ICU [CC]    Clinical Course User Index [CC] Jerral Meth, MD    Procedures .Critical Care  Performed by: Jerral Meth, MD Authorized by: Jerral Meth, MD   Critical care provider statement:    Critical care time (minutes):  85   Critical care was time spent personally by me on the following activities:  Development of treatment plan with patient or surrogate, discussions with consultants, evaluation of patient's response to treatment, examination of patient, ordering and review of laboratory studies, ordering and review of radiographic studies, ordering and performing treatments and interventions, pulse oximetry, re-evaluation of patient's condition and review of old charts    Medications Ordered in ED Medications  lactated ringers  infusion (  Intravenous New Bag/Given 10/18/23 0304)  0.9 %  sodium chloride  infusion (has no administration in time range)  norepinephrine  (LEVOPHED ) 4mg  in (0.016 mg/mL) premix infusion (has no administration in time range)  lactated ringers  bolus 1,000 mL (0 mLs Intravenous Stopped 10/18/23 0336)    And  lactated ringers  bolus 1,000 mL (0 mLs Intravenous Stopped 10/18/23 0337)    And  lactated ringers  bolus 250 mL (0 mLs Intravenous Stopped 10/18/23 0358)  ceFEPIme  (MAXIPIME ) 2 g in sodium chloride  0.9 % 100 mL IVPB (0 g Intravenous Stopped 10/18/23 0255)  vancomycin  (VANCOREADY) IVPB 1500 mg/300 mL (0 mg Intravenous Stopped 10/18/23 0501)    Medical Decision Making:   Harold [name] is a 89 y.o. male who presented to the ED today with multiple symptoms detailed above.    Patient placed on continuous vitals and telemetry monitoring while in ED which was reviewed periodically.  Complete initial physical exam performed, notably the patient  was ill-appearing. During this initial exam, patient met criteria for activation of code sepsis due to presence of the following SIRS criteria as well as suspected infectious etiology: tachypnea, tachycardia, fever 102 at home. Reviewed and confirmed nursing documentation for past medical history, family history, social history.    Initial Assessment:   With the patient's presentation of signs and symptoms of sepsis, most likely diagnosis is bacteremia secondary to underlying infection.  Considerations for source were initiated including:Urinary tract infections, abdominal infections such as cholecystitis/cholangitis/appendicitis, pulmonary etiology, bacteremia, skin etiology such as cellulitis or fasciitis, neurologic etiology such as meningitis or encephalitis.  This is most consistent with an acute life/limb threatening illness complicated by underlying chronic conditions.  Initial Plan:  Activated hospital protocol code sepsis including blood cultures, lactic acid screening, and further diagnostic care and management. Therapeutically, resuscitation fluids were considered. Patient has no contraindication to fluid resuscitation and therefore 30 cc of IV fluids per kilogram were administered Patient remained hypotensive after appropriate fluid administration and vasopressors were initiated to maintain perfusing blood pressure Therapeutically, antibiotics were administered on a broad-spectrum nature. Undifferentiated source: Vancomycin  and cefepime  were administered to cover gram-positive and gram-negative high risk infections  Screening labs including CBC and Metabolic panel to evaluate for  infectious or metabolic etiology of disease.  Urinalysis with reflex culture ordered to evaluate for UTI or relevant urologic/nephrologic pathology.  CXR to evaluate for structural/infectious intrathoracic pathology.  EKG and serial troponin to evaluate for cardiac pathology. Objective evaluation as below reviewed   Initial Study Results:   Laboratory  All laboratory results reviewed without evidence of clinically relevant pathology.   Exceptions include: troponin uptrending   EKG EKG was reviewed independently. Rate, rhythm, axis, intervals all examined and without medically relevant abnormality. ST segments without concerns for elevations.    Radiology:  All images reviewed independently. Agree with radiology report at this time.   DG Chest Port 1 View Result Date: 10/18/2023 EXAM: 1 VIEW XRAY OF THE CHEST 10/18/2023 02:13:00 AM COMPARISON: 01/01/2011 CLINICAL HISTORY: 355200 Chest pain [id number]. Per chart: Patient BIB EMS for chest pain and fever that started last night. Patient was seen here earlier this week and had port implanted for chemotherapy related to Non-Hodgkin's lymphoma. Patient given 324 ASA and 1 inch nitropaste from EMS. Denies any pain at this time. FINDINGS: LUNGS AND PLEURA: No focal pulmonary opacity. No pulmonary edema. No pleural effusion. No pneumothorax. HEART AND MEDIASTINUM: No acute abnormality of the cardiac and mediastinal silhouettes. BONES AND SOFT TISSUES: No acute osseous abnormality. Right  chest wall port-a-cath with tip in the lower SVC. IMPRESSION: 1. No acute process. Electronically signed by: Franky Stanford MD 10/18/2023 02:24 AM EDT RP Workstation: [id number]   ECHOCARDIOGRAM COMPLETE Result Date: 10/15/2023    ECHOCARDIOGRAM REPORT   Patient Name:   DARRIO BADE Date of Exam: 10/15/2023 Medical Rec #:  [id number]      Height:       66.0 in Accession #:    [id number]     Weight:       155.3 lb Date of Birth:  07-Mar-1935      BSA:          1.796 m Patient Age:     88 years       BP:           95/50 mmHg Patient Gender: M              HR:           68 bpm. Exam Location:  Inpatient Procedure: 2D Echo, 3D Echo, Strain Analysis, Cardiac Doppler and Color Doppler            (Both Spectral and Color Flow Doppler were utilized during            procedure). Indications:    CAD native vessel I25.10  History:        Patient has prior history of Echocardiogram examinations, most                 recent 01/29/2023. CAD and Previous Myocardial Infarction,                 Mitral Valve Disease; Risk Factors:Hypertension and                 Dyslipidemia.  Sonographer:    Koleen Popper RDCS Referring Phys: [phone] ANAND D HONGALGI  Sonographer Comments: Global longitudinal strain was attempted. IMPRESSIONS  1. Left ventricular ejection fraction, by estimation, is 60 to 65%. Left ventricular ejection fraction by 3D volume is 62 %. The left ventricle has normal function. The left ventricle has no regional wall motion abnormalities. Left ventricular diastolic  parameters are consistent with Grade II diastolic dysfunction (pseudonormalization). The average left ventricular global longitudinal strain is -19.9 %. The global longitudinal strain is normal.  2. Right ventricular systolic function is mildly reduced. The right ventricular size is mildly enlarged. There is mildly elevated pulmonary artery systolic pressure. The estimated right ventricular systolic pressure is 41.6 mmHg.  3. The mitral valve is normal in structure. Mild mitral valve regurgitation. Mild mitral stenosis. Severe mitral annular calcification.  4. The tricuspid valve is degenerative. Tricuspid valve regurgitation is moderate.  5. The aortic valve has an indeterminant number of cusps. There is severe calcifcation of the aortic valve. Aortic valve regurgitation is mild. Mild aortic valve stenosis. Aortic valve Vmax measures 2.17 m/s.  6. The inferior vena cava is dilated in size with <50% respiratory variability, suggesting  right atrial pressure of 15 mmHg. FINDINGS  Left Ventricle: Left ventricular ejection fraction, by estimation, is 60 to 65%. Left ventricular ejection fraction by 3D volume is 62 %. The left ventricle has normal function. The left ventricle has no regional wall motion abnormalities. The average left ventricular global longitudinal strain is -19.9 %. Strain was performed and the global longitudinal strain is normal. The left ventricular internal cavity size was normal in size. There is no left ventricular hypertrophy. Left ventricular diastolic parameters are consistent with Grade II diastolic dysfunction (pseudonormalization).  Right Ventricle: The right ventricular size is mildly enlarged. No increase in right ventricular wall thickness. Right ventricular systolic function is mildly reduced. There is mildly elevated pulmonary artery systolic pressure. The tricuspid regurgitant  velocity is 2.58 m/s, and with an assumed right atrial pressure of 15 mmHg, the estimated right ventricular systolic pressure is 41.6 mmHg. Left Atrium: Left atrial size was normal in size. Right Atrium: Right atrial size was normal in size. Pericardium: There is no evidence of pericardial effusion. Mitral Valve: The mitral valve is normal in structure. Severe mitral annular calcification. Mild mitral valve regurgitation. Mild mitral valve stenosis. MV peak gradient, 12.7 mmHg. The mean mitral valve gradient is 4.0 mmHg. Tricuspid Valve: The tricuspid valve is degenerative in appearance. Tricuspid valve regurgitation is moderate . No evidence of tricuspid stenosis. Aortic Valve: The aortic valve has an indeterminant number of cusps. There is severe calcifcation of the aortic valve. Aortic valve regurgitation is mild. Mild aortic stenosis is present. Aortic valve mean gradient measures 11.0 mmHg. Aortic valve peak gradient measures 18.8 mmHg. Aortic valve area, by VTI measures 1.07 cm. Pulmonic Valve: The pulmonic valve was grossly normal.  Pulmonic valve regurgitation is mild. No evidence of pulmonic stenosis. Aorta: The aortic root is normal in size and structure. Venous: The inferior vena cava is dilated in size with less than 50% respiratory variability, suggesting right atrial pressure of 15 mmHg. IAS/Shunts: No atrial level shunt detected by color flow Doppler. Additional Comments: 3D was performed not requiring image post processing on an independent workstation and was normal.  LEFT VENTRICLE PLAX 2D LVIDd:         3.60 cm         Diastology LVIDs:         2.30 cm         LV e' medial:    6.20 cm/s LV PW:         1.00 cm         LV E/e' medial:  18.9 LV IVS:        1.00 cm         LV e' lateral:   5.77 cm/s LVOT diam:     1.50 cm         LV E/e' lateral: 20.3 LV SV:         46 LV SV Index:   26              2D Longitudinal LVOT Area:     1.77 cm        Strain                                2D Strain GLS   -19.9 %                                Avg:                                 3D Volume EF                                LV 3D EF:    Left  ventricul                                             ar                                             ejection                                             fraction                                             by 3D                                             volume is                                             62 %.                                 3D Volume EF:                                3D EF:        62 %                                LV EDV:       93 ml                                LV ESV:       35 ml                                LV SV:        58 ml RIGHT VENTRICLE             IVC RV Basal diam:  4.30 cm     IVC diam: 2.40 cm RV Mid diam:    3.80 cm RV S prime:     14.90 cm/s TAPSE (M-mode): 2.0 cm LEFT ATRIUM             Index        RIGHT ATRIUM           Index LA diam:        3.30 cm 1.84 cm/m   RA Area:     12.80 cm LA Vol (A2C):   37.9 ml 21.10  ml/m  RA Volume:   29.90 ml  16.65 ml/m LA Vol (A4C):   35.3 ml 19.65 ml/m LA Biplane Vol: 39.5 ml 21.99 ml/m  AORTIC VALVE AV Area (Vmax):    1.07 cm AV Area (Vmean):   0.94 cm AV Area (VTI):     1.07 cm AV Vmax:           217.00 cm/s AV Vmean:          157.000 cm/s AV VTI:            0.432 m AV Peak Grad:      18.8 mmHg AV Mean Grad:      11.0 mmHg LVOT Vmax:         131.00 cm/s LVOT Vmean:        83.200 cm/s LVOT VTI:          0.261 m LVOT/AV VTI ratio: 0.60  AORTA Ao Root diam: 2.70 cm Ao Asc diam:  3.30 cm MITRAL VALVE                TRICUSPID VALVE MV Area (PHT): 2.16 cm     TR Peak grad:   26.6 mmHg MV Area VTI:   1.02 cm     TR Vmax:        258.00 cm/s MV Peak grad:  12.7 mmHg MV Mean grad:  4.0 mmHg     SHUNTS MV Vmax:       1.78 m/s     Systemic VTI:  0.26 m MV Vmean:      95.9 cm/s    Systemic Diam: 1.50 cm MV Decel Time: 351 msec MV E velocity: 117.00 cm/s MV A velocity: 149.00 cm/s MV E/A ratio:  0.79 Morene Brownie Electronically signed by Morene Brownie Signature Date/Time: 10/15/2023/6:24:54 PM    Final    IR IMAGING GUIDED PORT INSERTION Result Date: 10/15/2023 INDICATION: 89 year old male with a known history of low-grade follicular lymphoma with enlarging face all mass concerning for transformation to high-grade lymphoma. He presents for port catheter placement to establish durable venous access in preparation for chemotherapy as well as ultrasound-guided core biopsy of the facial mass. EXAM: IMPLANTED PORT A CATH PLACEMENT WITH ULTRASOUND AND FLUOROSCOPIC GUIDANCE ULTRASOUND-GUIDED CORE BIOPSY MEDICATIONS: None. ANESTHESIA/SEDATION: Versed  2 mg IV; Fentanyl  100 mcg IV; administered by the radiology nurse Moderate Sedation Time:  42 minutes The patient's vital signs and level of consciousness were continuously monitored during the procedure by the interventional radiology nurse under my direct supervision. FLUOROSCOPY: Radiation exposure index: 3 mGy reference air kerma COMPLICATIONS:  None immediate. PROCEDURE: The right neck and chest was prepped with chlorhexidine , and draped in the usual sterile fashion using maximum barrier technique (cap and mask, sterile gown, sterile gloves, large sterile sheet, hand hygiene and cutaneous antiseptic). Local anesthesia was attained by infiltration with 1% lidocaine  with epinephrine . Ultrasound demonstrated patency of the right internal jugular vein, and this was documented with an image. Under real-time ultrasound guidance, this vein was accessed with a 21 gauge micropuncture needle and image documentation was performed. A small dermatotomy was made at the access site with an 11 scalpel. A 0.018 wire was advanced into the SVC and the access needle exchanged for a 58F micropuncture vascular sheath. The 0.018 wire was then removed and a 0.035 wire advanced into the IVC. An appropriate location for the subcutaneous reservoir was selected below the clavicle and an incision was made through the skin and underlying soft tissues. The subcutaneous tissues were then dissected using a combination of blunt and sharp surgical  technique and a pocket was formed. A Bard Clear Vue single lumen power injectable portacatheter was then tunneled through the subcutaneous tissues from the pocket to the dermatotomy and the port reservoir placed within the subcutaneous pocket. The venous access site was then serially dilated and a peel away vascular sheath placed over the wire. The wire was removed and the port catheter advanced into position under fluoroscopic guidance. The catheter tip is positioned in the superior cavoatrial junction. This was documented with a spot image. The portacatheter was then tested and found to flush and aspirate well. The port was flushed with saline followed by 100 units/mL heparinized saline. The pocket was then closed in two layers using first subdermal inverted interrupted absorbable sutures followed by a running subcuticular suture. The  epidermis was then sealed with Dermabond. The dermatotomy at the venous access site was also closed with Dermabond. The port catheter was left accessed and sterile bandages were applied. Next, the facial mass was evaluated. The mass was interrogated with ultrasound. A suitable biopsy entry site was identified in the submental space allowing passage through relatively normal soft tissues in a region of relatively low vascularity within the mass. The skin was sterilely prepped and draped in the usual fashion using chlorhexidine  skin prep. Local anesthesia was attained by infiltration with 1% lidocaine . A small dermatotomy was made. Under real-time ultrasound guidance, multiple 18 gauge core biopsies were obtained using the Argon automated biopsy device. Biopsy specimens were placed on a moistened Telfa pad and submitted to pathology for further analysis. Post biopsy imaging demonstrates no evidence of active bleeding or other complication. IMPRESSION: Successful placement of a right IJ approach Bard Clear Vue port catheter with ultrasound and fluoroscopic guidance. The catheter is ready for use. Successful ultrasound-guided biopsy of right facial mass. Electronically Signed   By: Wilkie Lent M.D.   On: 10/15/2023 11:35   IR US  Guide Bx Asp/Drain Result Date: 10/15/2023 INDICATION: 89 year old male with a known history of low-grade follicular lymphoma with enlarging face all mass concerning for transformation to high-grade lymphoma. He presents for port catheter placement to establish durable venous access in preparation for chemotherapy as well as ultrasound-guided core biopsy of the facial mass. EXAM: IMPLANTED PORT A CATH PLACEMENT WITH ULTRASOUND AND FLUOROSCOPIC GUIDANCE ULTRASOUND-GUIDED CORE BIOPSY MEDICATIONS: None. ANESTHESIA/SEDATION: Versed  2 mg IV; Fentanyl  100 mcg IV; administered by the radiology nurse Moderate Sedation Time:  42 minutes The patient's vital signs and level of consciousness were  continuously monitored during the procedure by the interventional radiology nurse under my direct supervision. FLUOROSCOPY: Radiation exposure index: 3 mGy reference air kerma COMPLICATIONS: None immediate. PROCEDURE: The right neck and chest was prepped with chlorhexidine , and draped in the usual sterile fashion using maximum barrier technique (cap and mask, sterile gown, sterile gloves, large sterile sheet, hand hygiene and cutaneous antiseptic). Local anesthesia was attained by infiltration with 1% lidocaine  with epinephrine . Ultrasound demonstrated patency of the right internal jugular vein, and this was documented with an image. Under real-time ultrasound guidance, this vein was accessed with a 21 gauge micropuncture needle and image documentation was performed. A small dermatotomy was made at the access site with an 11 scalpel. A 0.018 wire was advanced into the SVC and the access needle exchanged for a 78F micropuncture vascular sheath. The 0.018 wire was then removed and a 0.035 wire advanced into the IVC. An appropriate location for the subcutaneous reservoir was selected below the clavicle and an incision was made through the skin and underlying  soft tissues. The subcutaneous tissues were then dissected using a combination of blunt and sharp surgical technique and a pocket was formed. A Bard Clear Vue single lumen power injectable portacatheter was then tunneled through the subcutaneous tissues from the pocket to the dermatotomy and the port reservoir placed within the subcutaneous pocket. The venous access site was then serially dilated and a peel away vascular sheath placed over the wire. The wire was removed and the port catheter advanced into position under fluoroscopic guidance. The catheter tip is positioned in the superior cavoatrial junction. This was documented with a spot image. The portacatheter was then tested and found to flush and aspirate well. The port was flushed with saline followed by  100 units/mL heparinized saline. The pocket was then closed in two layers using first subdermal inverted interrupted absorbable sutures followed by a running subcuticular suture. The epidermis was then sealed with Dermabond. The dermatotomy at the venous access site was also closed with Dermabond. The port catheter was left accessed and sterile bandages were applied. Next, the facial mass was evaluated. The mass was interrogated with ultrasound. A suitable biopsy entry site was identified in the submental space allowing passage through relatively normal soft tissues in a region of relatively low vascularity within the mass. The skin was sterilely prepped and draped in the usual fashion using chlorhexidine  skin prep. Local anesthesia was attained by infiltration with 1% lidocaine . A small dermatotomy was made. Under real-time ultrasound guidance, multiple 18 gauge core biopsies were obtained using the Argon automated biopsy device. Biopsy specimens were placed on a moistened Telfa pad and submitted to pathology for further analysis. Post biopsy imaging demonstrates no evidence of active bleeding or other complication. IMPRESSION: Successful placement of a right IJ approach Bard Clear Vue port catheter with ultrasound and fluoroscopic guidance. The catheter is ready for use. Successful ultrasound-guided biopsy of right facial mass. Electronically Signed   By: Wilkie Lent M.D.   On: 10/15/2023 11:35   CT SOFT TISSUE NECK W CONTRAST Result Date: 10/14/2023 CLINICAL DATA:  89 year old male with a rapidly enlarging right face mass and history of follicular lymphoma. EXAM: CT NECK WITH CONTRAST TECHNIQUE: Multidetector CT imaging of the neck was performed using the standard protocol following the bolus administration of intravenous contrast. RADIATION DOSE REDUCTION: This exam was performed according to the departmental dose-optimization program which includes automated exposure control, adjustment of the mA  and/or kV according to patient size and/or use of iterative reconstruction technique. CONTRAST:  75mL OMNIPAQUE  IOHEXOL  300 MG/ML  SOLN COMPARISON:  Face/neck soft tissue ultrasound 08/27/2023. PET-CT 08/25/2023. Neck CT 04/13/2020. FINDINGS: Pharynx and larynx: Larynx and pharynx soft tissue contours, mucosal enhancement remain within normal limits. Negative parapharyngeal and retropharyngeal spaces. Salivary glands: Large, amorphous, relatively homogeneous trans spatial soft tissue mass about the right mandible body and ramus corresponding to similar appearing mass with intense FDG uptake on June PET-CT. The bulk of the mass is superficial to the mandible oil as in June. This mass with indistinct margins encompasses 95 x 44 by 124 mm (AP by transverse by CC) subcutaneous and dermis involvement. Some involvement of the right lower masticator space. Extension into the right submandibular space with mild mass effect on the right submandibular gland (series 2, image 62). Relatively spared left sublingual space. Left anterior inferior parotid space mildly affected. Contralateral left submandibular, masticator and parotid spaces are negative. Thyroid : Negative. Lymph nodes: Large superficial right face mass described above. Associated contiguous abnormal right level 1A lymph node in  the midline which is 18 mm short axis on series 2, image 73. This is new or progressed since 08/25/2023. But little if any additional cervical lymphadenopathy, right level 2 nodes remain subcentimeter. Vascular: Both internal jugular veins in the neck appear partially effaced but remain patent. Other major vascular structures in the bilateral neck and at the skull base are patent. Right greater than left carotid bifurcation atherosclerosis. Limited intracranial: Negative for age. Visualized orbits: Stable since 2022 and negative. Mastoids and visualized paranasal sinuses: Visualized paranasal sinuses and mastoids are stable and well aerated.  Skeleton: Despite the bulky adjacent soft tissue mass the right mandible appears to remain intact and normally located. And no acute or suspicious osseous lesion is identified, with chronic cervical spine degeneration. Upper chest: Bilateral lung apex and subpleural mild lung scarring. No visible superior mediastinal or axillary lymphadenopathy. IMPRESSION: 1. 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 Adjacent abnormal level 1A lymph node in the midline is new or progressed since June. 2. But no other lymphadenopathy in the Neck or at the thoracic inlet. And the underlying right mandible remains intact. Electronically Signed   By: VEAR Hurst M.D.   On: 10/14/2023 13:16      Consults: Case discussed with Cardiology and CCM.   Reassessment and Plan:   Patient has an NSTEMI that is likely demand related giving his ongoing other pathology.  I consulted cardiology who recommended trending troponin at this time echocardiogram in the morning. Patient's blood pressure continued to downtrend despite aggressive fluid resuscitation.  Lactic acid slowly uptrending.  Patient meets criteria for initiation on vasopressors and was started on peripheral Levophed .  ICU was contacted for admission and ongoing care and management.  Suspected bloodstream infection given his reported rigors and fever as well as uptrending lactic acid.  Blood and urine cultures pending.  Clinical Impression:  1. Septic shock (HCC)      Admit   Final Clinical Impression(s) / ED Diagnoses Final diagnoses:  Septic shock [facility])    Rx / DC Orders ED Discharge Orders     None         Jerral Meth, MD 10/18/23 [id number]    Jerral Meth, MD 10/18/23 [id number]

## 2023-10-18 NOTE — Consult Note (Addendum)
 Cardiology Consultation:   Patient ID: Harold [name] MRN: [id number]; DOB: [birth year]  Admit date: 10/18/2023 Date of Consult: 10/18/2023  Primary Care Provider: [name], MD [facility] Cardiologist: Lonni Cash, MD  [facility] Electrophysiologist:  None    Patient Profile:   Harold [name] is a 89 y.o. male with a hx of hypertension, CAD status post PCI of LAD  and RPLA, DVT, follicular lymphoma, hypertension and hyperlipidemia who is being seen today for the evaluation of chest pain at the request of Lamar Chris, MD.     History of Present Illness:   [name] is an 89 year old male with a history of hypertension, CAD status post PCI of the LAD and R PLA, DVT, follicular lymphoma, hypertension, hyperlipidemia as well as non-Hodgkin's lymphoma.  Currently getting chemotherapy for NHL.  He started having rigors last night with no shortness of breath or cough.  He does have a central line in place a code sepsis was called given his tachypnea, tachycardia and fever of 102.  It was felt the likely had bacteremia.  He was started on IV fluid resuscitation due to hypotension and Levophed  were initiated.  He was started on broad-spectrum antibiotics.   In ER labs consisted of elevated troponin at 18 and 48.  Potassium 3.3, serum creatinine 0.77, WBC 9.5, hemoglobin 11.7, platelet count 235.  Chest x-ray showed no acute process.  UA negative.  He had a recent 2D echo 10/15/2023 showing EF 60 to 65% with G2 DD, mild RV dysfunction with mild RVE and mild pulmonary hypertension with PASP 41 mmHg with mild MR, mild mitral stenosis with severe MAC, moderate TR and mild AS/AR.  Due to the elevated troponin along with chest pain cardiology is now asked to consult  The patient tells me that he has only had 1 episode of angina in the past year that he had to take an NTG for when he got mad at the [employer].  He did have some mild CP on the way to the ER this admit but no other CP.  He  denies any SOB.  Has had LE edema for months and says that his left leg got red in the past 24 hours.   Past Medical History:  Diagnosis Date   BPH (benign prostatic hypertrophy)    CAD (coronary artery disease) 01/2010   S/P drug eluting stents LAD and PL branch of RCA Dr. 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   follicular lymphoma dx'd 06/2017   Hernia    HLD (hyperlipidemia)    HTN (hypertension)     Past Surgical History:  Procedure Laterality Date   BACK SURGERY     CORONARY ANGIOGRAPHY N/A 01/29/2023   Procedure: CORONARY ANGIOGRAPHY;  Surgeon: Cash Lonni BIRCH, MD;  Location: [facility];  Service: Cardiovascular;  Laterality: N/A;   CORONARY BALLOON ANGIOPLASTY N/A 01/29/2023   Procedure: CORONARY BALLOON ANGIOPLASTY;  Surgeon: Cash Lonni BIRCH, MD;  Location: [facility];  Service: Cardiovascular;  Laterality: N/A;   CORONARY LITHOTRIPSY N/A 01/29/2023   Procedure: CORONARY LITHOTRIPSY;  Surgeon: Cash Lonni BIRCH, MD;  Location: [facility];  Service: Cardiovascular;  Laterality: N/A;   CYSTOSCOPY WITH LITHOLAPAXY N/A 05/20/2019   Procedure: CYSTOSCOPY WITH LITHOLAPAXY;  Surgeon: Cam Morene ORN, MD;  Location: [facility];  Service: Urology;  Laterality: N/A;   CYSTOSCOPY/URETEROSCOPY/HOLMIUM LASER/STENT PLACEMENT Left 05/20/2019   Procedure: CYSTOSCOPY/URETEROSCOPY/HOLMIUM LASER/STENT PLACEMENT;  Surgeon: Cam Morene ORN, MD;  Location: [facility];  Service: Urology;  Laterality: Left;   HERNIA REPAIR  01/08/11   RIH   IR IMAGING GUIDED PORT INSERTION  10/15/2023   IR US  GUIDE BX ASP/DRAIN  10/15/2023   MASS EXCISION Right 08/05/2017   Procedure: EXCISION RIGHT POSTERIOR  NECK LYMPH NODE;  Surgeon: Ethyl Lonni BRAVO, MD;  Location: [facility];  Service: ENT;  Laterality: Right;     Home Medications:  Prior to Admission medications   Medication Sig Start Date End Date  Taking? Authorizing Provider  acyclovir  (ZOVIRAX ) 400 MG tablet Take 1 tablet (400 mg total) by mouth daily. 10/15/23   Kale, Gautam Kishore, MD  allopurinol  (ZYLOPRIM ) 100 MG tablet Take 1 tablet (100 mg total) by mouth 2 (two) times daily. 09/04/23   [name], MD  atenolol  (TENORMIN ) 25 MG tablet TAKE 1 TABLET BY MOUTH DAILY 10/02/23   Verlin Lonni BIRCH, MD  brimonidine  (ALPHAGAN ) 0.2 % ophthalmic solution Place 1 drop into the right eye 2 (two) times daily.    [provider]  cholecalciferol  (VITAMIN D ) 1000 UNITS tablet Take 1,000 Units by mouth daily.    [provider]  Cyanocobalamin  (B-12) 2500 MCG TABS Take 2,500 mcg by mouth daily.    [provider]  etoposide  (VEPESID ) 50 MG capsule Take 2 capsules (100 mg total) by mouth See admin instructions. On Day 2 and Day 3 of each cycle of R-CEOP 10/17/23   [name], MD  finasteride  (PROSCAR ) 5 MG tablet Take 5 mg by mouth daily.    [provider]  furosemide  (LASIX ) 20 MG tablet TAKE 1 TABLET BY MOUTH TWICE  DAILY 02/19/22   Parthenia Olivia HERO, PA-C  gabapentin  (NEURONTIN ) 100 MG capsule Take 1 capsule by mouth 2 (two) times daily as needed (for knee pain). 10/24/21   [provider]  lidocaine -prilocaine  (EMLA ) cream Apply to affected area once 10/15/23   [name], MD  nitroGLYCERIN  (NITROSTAT ) 0.4 MG SL tablet Place 1 tablet (0.4 mg total) under the tongue every 5 (five) minutes as needed for chest pain. 09/29/23 01/16/25  Verlin Lonni BIRCH, MD  ondansetron  (ZOFRAN ) 8 MG tablet Take 1 tablet (8 mg total) by mouth every 8 (eight) hours as needed for nausea or vomiting. Start on day 3 after cyclophosphamide  chemotherapy. 10/15/23   Kale, Gautam Kishore, MD  potassium chloride  SA (KLOR-CON  M) 20 MEQ tablet Take 1 tablet (20 mEq total) by mouth daily. Please keep upcoming appointment for future refills. Thank you. Patient taking differently: Take 10 mEq by mouth 2 (two)  times daily. Please keep upcoming appointment for future refills. Thank you. Take with fluid pill. 11/23/22   Odell Celinda Balo, MD  pravastatin  (PRAVACHOL ) 40 MG tablet Take 1 tablet (40 mg total) by mouth at bedtime. 12/26/21   Verlin Lonni BIRCH, MD  predniSONE  (DELTASONE ) 20 MG tablet Take 3 tablets (60 mg total) by mouth daily. Take on days 2-5 of chemotherapy. 10/15/23   Kale, Gautam Kishore, MD  prochlorperazine  (COMPAZINE ) 10 MG tablet Take 1 tablet (10 mg total) by mouth every 6 (six) hours as needed for nausea or vomiting. 10/15/23   [name], MD    Inpatient Medications: Scheduled Meds:  Continuous Infusions:  sodium chloride  Stopped (10/18/23 9386)   lactated ringers  150 mL/hr at 10/18/23 0304   norepinephrine  (LEVOPHED ) Adult infusion 5 mcg/min (10/18/23 0702)   PRN Meds: mouth rinse  Allergies:   No  Known Allergies  Social History:   Social History   Socioeconomic History   Marital status: Married    Spouse name: Not on file   Number of children: Not on file   Years of education: Not on file   Highest education level: Not on file  Occupational History   Not on file  Tobacco Use   Smoking status: Never   Smokeless tobacco: Never  Vaping Use   Vaping status: Never Used  Substance and Sexual Activity   Alcohol use: No   Drug use: No   Sexual activity: Not on file  Other Topics Concern   Not on file  Social History Narrative   Married   Social Drivers of Health   Financial Resource Strain: Low Risk  (11/17/2022)   Received from [facility]   Overall Financial Resource Strain (CARDIA)    Difficulty of Paying Living Expenses: Not very hard  Food Insecurity: No Food Insecurity (10/13/2023)   Hunger Vital Sign    Worried About Running Out of Food in the Last Year: Never true    Ran Out of Food in the Last Year: Never true  Transportation Needs: No Transportation Needs (10/13/2023)   PRAPARE - Administrator, Civil Service  (Medical): No    Lack of Transportation (Non-Medical): No  Physical Activity: Inactive (11/17/2022)   Received [facility]   Exercise Vital Sign    On average, how many days per week do you engage in moderate to strenuous exercise (like a brisk walk)?: 0 days    On average, how many minutes do you engage in exercise at this level?: 0 min  Stress: Stress Concern Present (11/17/2022)   Received from [facility] - Occupational Stress Questionnaire    Feeling of Stress : Rather much  Social Connections: Moderately Integrated (10/13/2023)   Social Connection and Isolation Panel    Frequency of Communication with Friends and Family: Three times a week    Frequency of Social Gatherings with Friends and Family: Once a week    Attends Religious Services: 1 to 4 times per year    Active Member of [employer] or Organizations: No    Attends Banker Meetings: 1 to 4 times per year    Marital Status: Widowed  Intimate Partner Violence: Not At Risk (10/13/2023)   Humiliation, Afraid, Rape, and Kick questionnaire    Fear of Current or Ex-Partner: No    Emotionally Abused: No    Physically Abused: No    Sexually Abused: No    Family History:    Family History  Problem Relation Age of Onset   Heart failure Mother      ROS:  Please see the history of present illness.   All other ROS reviewed and negative.     Physical Exam/Data:   Vitals:   10/18/23 0815 10/18/23 0830 10/18/23 0835 10/18/23 0836  BP: 100/62 (!) 76/61 (!) 96/48   Pulse: 84 79 78 78  Resp: 14 12 12 12   Temp:      TempSrc:      SpO2: 100% 99% 100% 100%  Weight:      Height:        Intake/Output Summary (Last 24 hours) at 10/18/2023 0907 Last data filed at 10/18/2023 0501 Gross per 24 hour  Intake 2630.08 ml  Output --  Net 2630.08 ml      10/18/2023    1:58 AM  10/13/2023    3:35 PM 10/13/2023   12:42 PM  Last 3 Weights  Weight (lbs) 155 lb 155 lb 5 oz 155 lb  3.2 oz  Weight (kg) 70.308 kg 70.45 kg 70.398 kg     Body mass index is 25.02 kg/m.  General:  Well nourished, well developed, in no acute distress HEENT: large facial mass on right side Lymph: no adenopathy Neck: no JVD Endocrine:  No thryomegaly Vascular: No carotid bruits; FA pulses 2+ bilaterally without bruits  Cardiac:  normal S1, S2; RRR; no murmur  Lungs:  clear to auscultation bilaterally, no wheezing, rhonchi or rales  Abd: soft, nontender, no hepatomegaly  Ext: 3+ bilateral LE edema Musculoskeletal:  No deformities, BUE and BLE strength normal and equal Skin: warm and dry  Neuro:  CNs 2-12 intact, no focal abnormalities noted Psych:  Normal affect   EKG:  The EKG was personally reviewed and demonstrates: Sinus tachycardia 104 bpm with nonspecific T wave abnormality in aVL and less than a millimeter of J-point elevation in V2 V3 and V4 Telemetry:  Telemetry was personally reviewed and demonstrates: Normal sinus rhythm  Laboratory Data:  High Sensitivity Troponin:   Recent Labs  Lab 10/18/23 0205 10/18/23 0409  TROPONINIHS 18* 48*     Chemistry Recent Labs  Lab 10/13/23 1212 10/16/23 0700 10/18/23 0205  NA 143 139 141  K 3.6 4.3 3.3*  CL 103 101 101  CO2 35* 27 26  GLUCOSE 85 157* 99  BUN 22 22 35*  CREATININE 1.00 0.72 0.77  CALCIUM 8.9 8.9 9.5  GFRNONAA >60 >60 >60  ANIONGAP 5 11 14     Recent Labs  Lab 10/13/23 1212  PROT 6.1*  ALBUMIN 3.6  AST 15  ALT 10  ALKPHOS 53  BILITOT 0.5   Hematology Recent Labs  Lab 10/13/23 1212 10/16/23 0700 10/18/23 0205  WBC 5.0 4.9 9.5  RBC 3.99* 3.92* 4.18*  HGB 11.0* 10.6* 11.7*  HCT 34.2* 34.6* 36.5*  MCV 85.7 88.3 87.3  MCH 27.6 27.0 28.0  MCHC 32.2 30.6 32.1  RDW 14.7 14.6 14.7  PLT 269 260 235   BNPNo results for input(s): BNP, PROBNP in the last 168 hours.  DDimer No results for input(s): DDIMER in the last 168 hours.   Radiology/Studies:  DG Chest Port 1 View Result Date:  10/18/2023 EXAM: 1 VIEW XRAY OF THE CHEST 10/18/2023 02:13:00 AM COMPARISON: 01/01/2011 CLINICAL HISTORY: 355200 Chest pain [id number]. Per chart: Patient BIB EMS for chest pain and fever that started last night. Patient was seen here earlier this week and had port implanted for chemotherapy related to Non-Hodgkin's lymphoma. Patient given 324 ASA and 1 inch nitropaste from EMS. Denies any pain at this time. FINDINGS: LUNGS AND PLEURA: No focal pulmonary opacity. No pulmonary edema. No pleural effusion. No pneumothorax. HEART AND MEDIASTINUM: No acute abnormality of the cardiac and mediastinal silhouettes. BONES AND SOFT TISSUES: No acute osseous abnormality. Right chest wall port-a-cath with tip in the lower SVC. IMPRESSION: 1. No acute process. Electronically signed by: Franky Stanford MD 10/18/2023 02:24 AM EDT RP Workstation: [id number]   ECHOCARDIOGRAM COMPLETE Result Date: 10/15/2023    ECHOCARDIOGRAM REPORT   Patient Name:   CHANCELER PULLIN Date of Exam: 10/15/2023 Medical Rec #:  [id number]      Height:       66.0 in Accession #:    [id number]     Weight:       155.3 lb Date of Birth:  1934-10-28  BSA:          1.796 m Patient Age:    88 years       BP:           95/50 mmHg Patient Gender: M              HR:           68 bpm. Exam Location:  Inpatient Procedure: 2D Echo, 3D Echo, Strain Analysis, Cardiac Doppler and Color Doppler            (Both Spectral and Color Flow Doppler were utilized during            procedure). Indications:    CAD native vessel I25.10  History:        Patient has prior history of Echocardiogram examinations, most                 recent 01/29/2023. CAD and Previous Myocardial Infarction,                 Mitral Valve Disease; Risk Factors:Hypertension and                 Dyslipidemia.  Sonographer:    Koleen Popper RDCS Referring Phys: [phone] ANAND D HONGALGI  Sonographer Comments: Global longitudinal strain was attempted. IMPRESSIONS  1. Left ventricular ejection fraction, by estimation,  is 60 to 65%. Left ventricular ejection fraction by 3D volume is 62 %. The left ventricle has normal function. The left ventricle has no regional wall motion abnormalities. Left ventricular diastolic  parameters are consistent with Grade II diastolic dysfunction (pseudonormalization). The average left ventricular global longitudinal strain is -19.9 %. The global longitudinal strain is normal.  2. Right ventricular systolic function is mildly reduced. The right ventricular size is mildly enlarged. There is mildly elevated pulmonary artery systolic pressure. The estimated right ventricular systolic pressure is 41.6 mmHg.  3. The mitral valve is normal in structure. Mild mitral valve regurgitation. Mild mitral stenosis. Severe mitral annular calcification.  4. The tricuspid valve is degenerative. Tricuspid valve regurgitation is moderate.  5. The aortic valve has an indeterminant number of cusps. There is severe calcifcation of the aortic valve. Aortic valve regurgitation is mild. Mild aortic valve stenosis. Aortic valve Vmax measures 2.17 m/s.  6. The inferior vena cava is dilated in size with <50% respiratory variability, suggesting right atrial pressure of 15 mmHg. FINDINGS  Left Ventricle: Left ventricular ejection fraction, by estimation, is 60 to 65%. Left ventricular ejection fraction by 3D volume is 62 %. The left ventricle has normal function. The left ventricle has no regional wall motion abnormalities. The average left ventricular global longitudinal strain is -19.9 %. Strain was performed and the global longitudinal strain is normal. The left ventricular internal cavity size was normal in size. There is no left ventricular hypertrophy. Left ventricular diastolic parameters are consistent with Grade II diastolic dysfunction (pseudonormalization). Right Ventricle: The right ventricular size is mildly enlarged. No increase in right ventricular wall thickness. Right ventricular systolic function is mildly  reduced. There is mildly elevated pulmonary artery systolic pressure. The tricuspid regurgitant  velocity is 2.58 m/s, and with an assumed right atrial pressure of 15 mmHg, the estimated right ventricular systolic pressure is 41.6 mmHg. Left Atrium: Left atrial size was normal in size. Right Atrium: Right atrial size was normal in size. Pericardium: There is no evidence of pericardial effusion. Mitral Valve: The mitral valve is normal in structure. Severe mitral annular calcification. Mild mitral valve  regurgitation. Mild mitral valve stenosis. MV peak gradient, 12.7 mmHg. The mean mitral valve gradient is 4.0 mmHg. Tricuspid Valve: The tricuspid valve is degenerative in appearance. Tricuspid valve regurgitation is moderate . No evidence of tricuspid stenosis. Aortic Valve: The aortic valve has an indeterminant number of cusps. There is severe calcifcation of the aortic valve. Aortic valve regurgitation is mild. Mild aortic stenosis is present. Aortic valve mean gradient measures 11.0 mmHg. Aortic valve peak gradient measures 18.8 mmHg. Aortic valve area, by VTI measures 1.07 cm. Pulmonic Valve: The pulmonic valve was grossly normal. Pulmonic valve regurgitation is mild. No evidence of pulmonic stenosis. Aorta: The aortic root is normal in size and structure. Venous: The inferior vena cava is dilated in size with less than 50% respiratory variability, suggesting right atrial pressure of 15 mmHg. IAS/Shunts: No atrial level shunt detected by color flow Doppler. Additional Comments: 3D was performed not requiring image post processing on an independent workstation and was normal.  LEFT VENTRICLE PLAX 2D LVIDd:         3.60 cm         Diastology LVIDs:         2.30 cm         LV e' medial:    6.20 cm/s LV PW:         1.00 cm         LV E/e' medial:  18.9 LV IVS:        1.00 cm         LV e' lateral:   5.77 cm/s LVOT diam:     1.50 cm         LV E/e' lateral: 20.3 LV SV:         46 LV SV Index:   26              2D  Longitudinal LVOT Area:     1.77 cm        Strain                                2D Strain GLS   -19.9 %                                Avg:                                 3D Volume EF                                LV 3D EF:    Left                                             ventricul                                             ar  ejection                                             fraction                                             by 3D                                             volume is                                             62 %.                                 3D Volume EF:                                3D EF:        62 %                                LV EDV:       93 ml                                LV ESV:       35 ml                                LV SV:        58 ml RIGHT VENTRICLE             IVC RV Basal diam:  4.30 cm     IVC diam: 2.40 cm RV Mid diam:    3.80 cm RV S prime:     14.90 cm/s TAPSE (M-mode): 2.0 cm LEFT ATRIUM             Index        RIGHT ATRIUM           Index LA diam:        3.30 cm 1.84 cm/m   RA Area:     12.80 cm LA Vol (A2C):   37.9 ml 21.10 ml/m  RA Volume:   29.90 ml  16.65 ml/m LA Vol (A4C):   35.3 ml 19.65 ml/m LA Biplane Vol: 39.5 ml 21.99 ml/m  AORTIC VALVE AV Area (Vmax):    1.07 cm AV Area (Vmean):   0.94 cm AV Area (VTI):     1.07 cm AV Vmax:           217.00 cm/s AV Vmean:          157.000 cm/s AV VTI:            0.432 m AV Peak Grad:  18.8 mmHg AV Mean Grad:      11.0 mmHg LVOT Vmax:         131.00 cm/s LVOT Vmean:        83.200 cm/s LVOT VTI:          0.261 m LVOT/AV VTI ratio: 0.60  AORTA Ao Root diam: 2.70 cm Ao Asc diam:  3.30 cm MITRAL VALVE                TRICUSPID VALVE MV Area (PHT): 2.16 cm     TR Peak grad:   26.6 mmHg MV Area VTI:   1.02 cm     TR Vmax:        258.00 cm/s MV Peak grad:  12.7 mmHg MV Mean grad:  4.0 mmHg     SHUNTS MV Vmax:       1.78 m/s     Systemic VTI:  0.26 m MV  Vmean:      95.9 cm/s    Systemic Diam: 1.50 cm MV Decel Time: 351 msec MV E velocity: 117.00 cm/s MV A velocity: 149.00 cm/s MV E/A ratio:  0.79 Morene Brownie Electronically signed by Morene Brownie Signature Date/Time: 10/15/2023/6:24:54 PM    Final    IR IMAGING GUIDED PORT INSERTION Result Date: 10/15/2023 INDICATION: 89 year old male with a known history of low-grade follicular lymphoma with enlarging face all mass concerning for transformation to high-grade lymphoma. He presents for port catheter placement to establish durable venous access in preparation for chemotherapy as well as ultrasound-guided core biopsy of the facial mass. EXAM: IMPLANTED PORT A CATH PLACEMENT WITH ULTRASOUND AND FLUOROSCOPIC GUIDANCE ULTRASOUND-GUIDED CORE BIOPSY MEDICATIONS: None. ANESTHESIA/SEDATION: Versed  2 mg IV; Fentanyl  100 mcg IV; administered by the radiology nurse Moderate Sedation Time:  42 minutes The patient's vital signs and level of consciousness were continuously monitored during the procedure by the interventional radiology nurse under my direct supervision. FLUOROSCOPY: Radiation exposure index: 3 mGy reference air kerma COMPLICATIONS: None immediate. PROCEDURE: The right neck and chest was prepped with chlorhexidine , and draped in the usual sterile fashion using maximum barrier technique (cap and mask, sterile gown, sterile gloves, large sterile sheet, hand hygiene and cutaneous antiseptic). Local anesthesia was attained by infiltration with 1% lidocaine  with epinephrine . Ultrasound demonstrated patency of the right internal jugular vein, and this was documented with an image. Under real-time ultrasound guidance, this vein was accessed with a 21 gauge micropuncture needle and image documentation was performed. A small dermatotomy was made at the access site with an 11 scalpel. A 0.018 wire was advanced into the SVC and the access needle exchanged for a 48F micropuncture vascular sheath. The 0.018 wire was  then removed and a 0.035 wire advanced into the IVC. An appropriate location for the subcutaneous reservoir was selected below the clavicle and an incision was made through the skin and underlying soft tissues. The subcutaneous tissues were then dissected using a combination of blunt and sharp surgical technique and a pocket was formed. A Bard Clear Vue single lumen power injectable portacatheter was then tunneled through the subcutaneous tissues from the pocket to the dermatotomy and the port reservoir placed within the subcutaneous pocket. The venous access site was then serially dilated and a peel away vascular sheath placed over the wire. The wire was removed and the port catheter advanced into position under fluoroscopic guidance. The catheter tip is positioned in the superior cavoatrial junction. This was documented with a spot image. The portacatheter was then tested and found to  flush and aspirate well. The port was flushed with saline followed by 100 units/mL heparinized saline. The pocket was then closed in two layers using first subdermal inverted interrupted absorbable sutures followed by a running subcuticular suture. The epidermis was then sealed with Dermabond. The dermatotomy at the venous access site was also closed with Dermabond. The port catheter was left accessed and sterile bandages were applied. Next, the facial mass was evaluated. The mass was interrogated with ultrasound. A suitable biopsy entry site was identified in the submental space allowing passage through relatively normal soft tissues in a region of relatively low vascularity within the mass. The skin was sterilely prepped and draped in the usual fashion using chlorhexidine  skin prep. Local anesthesia was attained by infiltration with 1% lidocaine . A small dermatotomy was made. Under real-time ultrasound guidance, multiple 18 gauge core biopsies were obtained using the Argon automated biopsy device. Biopsy specimens were placed on a  moistened Telfa pad and submitted to pathology for further analysis. Post biopsy imaging demonstrates no evidence of active bleeding or other complication. IMPRESSION: Successful placement of a right IJ approach Bard Clear Vue port catheter with ultrasound and fluoroscopic guidance. The catheter is ready for use. Successful ultrasound-guided biopsy of right facial mass. Electronically Signed   By: Wilkie Lent M.D.   On: 10/15/2023 11:35   IR US  Guide Bx Asp/Drain Result Date: 10/15/2023 INDICATION: 89 year old male with a known history of low-grade follicular lymphoma with enlarging face all mass concerning for transformation to high-grade lymphoma. He presents for port catheter placement to establish durable venous access in preparation for chemotherapy as well as ultrasound-guided core biopsy of the facial mass. EXAM: IMPLANTED PORT A CATH PLACEMENT WITH ULTRASOUND AND FLUOROSCOPIC GUIDANCE ULTRASOUND-GUIDED CORE BIOPSY MEDICATIONS: None. ANESTHESIA/SEDATION: Versed  2 mg IV; Fentanyl  100 mcg IV; administered by the radiology nurse Moderate Sedation Time:  42 minutes The patient's vital signs and level of consciousness were continuously monitored during the procedure by the interventional radiology nurse under my direct supervision. FLUOROSCOPY: Radiation exposure index: 3 mGy reference air kerma COMPLICATIONS: None immediate. PROCEDURE: The right neck and chest was prepped with chlorhexidine , and draped in the usual sterile fashion using maximum barrier technique (cap and mask, sterile gown, sterile gloves, large sterile sheet, hand hygiene and cutaneous antiseptic). Local anesthesia was attained by infiltration with 1% lidocaine  with epinephrine . Ultrasound demonstrated patency of the right internal jugular vein, and this was documented with an image. Under real-time ultrasound guidance, this vein was accessed with a 21 gauge micropuncture needle and image documentation was performed. A small dermatotomy  was made at the access site with an 11 scalpel. A 0.018 wire was advanced into the SVC and the access needle exchanged for a 81F micropuncture vascular sheath. The 0.018 wire was then removed and a 0.035 wire advanced into the IVC. An appropriate location for the subcutaneous reservoir was selected below the clavicle and an incision was made through the skin and underlying soft tissues. The subcutaneous tissues were then dissected using a combination of blunt and sharp surgical technique and a pocket was formed. A Bard Clear Vue single lumen power injectable portacatheter was then tunneled through the subcutaneous tissues from the pocket to the dermatotomy and the port reservoir placed within the subcutaneous pocket. The venous access site was then serially dilated and a peel away vascular sheath placed over the wire. The wire was removed and the port catheter advanced into position under fluoroscopic guidance. The catheter tip is positioned in the superior cavoatrial  junction. This was documented with a spot image. The portacatheter was then tested and found to flush and aspirate well. The port was flushed with saline followed by 100 units/mL heparinized saline. The pocket was then closed in two layers using first subdermal inverted interrupted absorbable sutures followed by a running subcuticular suture. The epidermis was then sealed with Dermabond. The dermatotomy at the venous access site was also closed with Dermabond. The port catheter was left accessed and sterile bandages were applied. Next, the facial mass was evaluated. The mass was interrogated with ultrasound. A suitable biopsy entry site was identified in the submental space allowing passage through relatively normal soft tissues in a region of relatively low vascularity within the mass. The skin was sterilely prepped and draped in the usual fashion using chlorhexidine  skin prep. Local anesthesia was attained by infiltration with 1% lidocaine . A small  dermatotomy was made. Under real-time ultrasound guidance, multiple 18 gauge core biopsies were obtained using the Argon automated biopsy device. Biopsy specimens were placed on a moistened Telfa pad and submitted to pathology for further analysis. Post biopsy imaging demonstrates no evidence of active bleeding or other complication. IMPRESSION: Successful placement of a right IJ approach Bard Clear Vue port catheter with ultrasound and fluoroscopic guidance. The catheter is ready for use. Successful ultrasound-guided biopsy of right facial mass. Electronically Signed   By: Wilkie Lent M.D.   On: 10/15/2023 11:35   CT SOFT TISSUE NECK W CONTRAST Result Date: 10/14/2023 CLINICAL DATA:  89 year old male with a rapidly enlarging right face mass and history of follicular lymphoma. EXAM: CT NECK WITH CONTRAST TECHNIQUE: Multidetector CT imaging of the neck was performed using the standard protocol following the bolus administration of intravenous contrast. RADIATION DOSE REDUCTION: This exam was performed according to the departmental dose-optimization program which includes automated exposure control, adjustment of the mA and/or kV according to patient size and/or use of iterative reconstruction technique. CONTRAST:  75mL OMNIPAQUE  IOHEXOL  300 MG/ML  SOLN COMPARISON:  Face/neck soft tissue ultrasound 08/27/2023. PET-CT 08/25/2023. Neck CT 04/13/2020. FINDINGS: Pharynx and larynx: Larynx and pharynx soft tissue contours, mucosal enhancement remain within normal limits. Negative parapharyngeal and retropharyngeal spaces. Salivary glands: Large, amorphous, relatively homogeneous trans spatial soft tissue mass about the right mandible body and ramus corresponding to similar appearing mass with intense FDG uptake on June PET-CT. The bulk of the mass is superficial to the mandible oil as in June. This mass with indistinct margins encompasses 95 x 44 by 124 mm (AP by transverse by CC) subcutaneous and dermis  involvement. Some involvement of the right lower masticator space. Extension into the right submandibular space with mild mass effect on the right submandibular gland (series 2, image 62). Relatively spared left sublingual space. Left anterior inferior parotid space mildly affected. Contralateral left submandibular, masticator and parotid spaces are negative. Thyroid : Negative. Lymph nodes: Large superficial right face mass described above. Associated contiguous abnormal right level 1A lymph node in the midline which is 18 mm short axis on series 2, image 73. This is new or progressed since 08/25/2023. But little if any additional cervical lymphadenopathy, right level 2 nodes remain subcentimeter. Vascular: Both internal jugular veins in the neck appear partially effaced but remain patent. Other major vascular structures in the bilateral neck and at the skull base are patent. Right greater than left carotid bifurcation atherosclerosis. Limited intracranial: Negative for age. Visualized orbits: Stable since 2022 and negative. Mastoids and visualized paranasal sinuses: Visualized paranasal sinuses and mastoids are stable and  well aerated. Skeleton: Despite the bulky adjacent soft tissue mass the right mandible appears to remain intact and normally located. And no acute or suspicious osseous lesion is identified, with chronic cervical spine degeneration. Upper chest: Bilateral lung apex and subpleural mild lung scarring. No visible superior mediastinal or axillary lymphadenopathy. IMPRESSION: 1. 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 Adjacent abnormal level 1A lymph node in the midline is new or progressed since June. 2. But no other lymphadenopathy in the Neck or at the thoracic inlet. And the underlying right mandible remains intact. Electronically Signed   By: VEAR Hurst M.D.   On: 10/14/2023 13:16      Assessment and Plan:  #ASCAD #Elevated troponin #Chest pain - He has a history of CAD with cath 01/29/2023: 60% ostial to prox RCA, 30% mid RCA, 40% prox LCx, 80% mid LAD proximal to prior LAD stent, 30% ISR of mid LAD stent , 50% dLAD and then occluded dLAD status post intracoronary lithotripsy of heavily calcified LAD with suboptimal expansion with balloon and therefore stent was not placed - 2D echo 10/14/2023 for workup for initiation of chemotherapy for NHL showed normal LV function - Given his chest pain in the setting of known significant CAD and minimally elevated troponin at 18>> 48, this is most consistent with demand ischemia in the setting of sepsis, hypotension and tachycardia and not consistent with ACS - Medical management of CAD difficult at this time as BP too soft to restart home dose of beta-blocker or addition of long-acting nitrates - Restart beta-blocker once hemodynamically more stable - Restart statin - Repeat 2D echo pending to rule out focal wall motion abnormality   #Septic shock - Currently on vasopressors with Levophed  - Etiology unknown but likely bacteremic>>? 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 - Plan is to initiate chemo on 10/20/2023  #Hypertension - PTA beta-blocker currently on hold and on Levophed  for pressor support for septic shock  Chronic diastolic CHF - Does not appear volume overloaded on exam today except for significant LE edema that he has had for months - 3+ BLE edema on exam today - Will have to watch for volume overload as he has gotten fluid resuscitation for his septic shock - consider abdominal and pelvic CT to assess for recurrent adenopathy that could be impeding venous return and resulting in LE edema>>his daughter says that he had XRT  for extensive retroperitoneal/ pelvic and abdominal lymphadenopathy in Sept 2024   Performed by: Wilbert Bihari, MD   Total critical care time: 45 minutes   Critical care time was exclusive of separately billable procedures and treating other patients.   Critical care was necessary to treat or prevent imminent or life-threatening deterioration.   Critical care was time spent personally by me (independent of APPs or residents) on the following activities: development of treatment plan with patient and/or surrogate as well as nursing, discussions with consultants, evaluation of patient's response to treatment, examination of patient, obtaining history from patient or surrogate, ordering and performing treatments and interventions, ordering and review of laboratory studies, ordering and review of radiographic studies, pulse oximetry and re-evaluation of patient's condition.   For questions or updates, please contact [facility] Please consult [email or web address] for  contact info under    Signed, Wilbert Bihari, MD  10/18/2023 9:07 AM

## 2023-10-18 NOTE — ED Triage Notes (Signed)
  Patient BIB EMS for chest pain and fever that started last night.  Patient was seen here earlier this week and had port implanted for chemotherapy related to Non-Hodgkin's lymphoma.  Patient given 324 ASA and 1 inch nitropaste from EMS.  Denies any pain at this time.

## 2023-10-18 NOTE — Progress Notes (Addendum)
 PHARMACY - ANTICOAGULATION CONSULT NOTE  Pharmacy Consult for Heparin  Indication: chest pain/ACS  No Known Allergies  Patient Measurements: Height: 5' 6 (167.6 cm) Weight: 70.3 kg (155 lb) IBW/kg (Calculated) : 63.8 HEPARIN  DW (KG): 70.3  Vital Signs: Temp: 97.7 F (36.5 C) (08/02 1648) Temp Source: Oral (08/02 1648) BP: 116/41 (08/02 1700) Pulse Rate: 62 (08/02 1700)  Labs: Recent Labs    10/16/23 0700 10/18/23 0205 10/18/23 0409 10/18/23 0947 10/18/23 1128 10/18/23 1543  HGB 10.6* 11.7*  --   --   --   --   HCT 34.6* 36.5*  --   --   --   --   PLT 260 235  --   --   --   --   CREATININE 0.72 0.77  --   --   --   --   TROPONINIHS  --  18*   < > 421* 610* 735*   < > = values in this interval not displayed.    Estimated Creatinine Clearance: 59.8 mL/min (by C-G formula based on SCr of 0.77 mg/dL).   Medical History: Past Medical History:  Diagnosis Date   BPH (benign prostatic hypertrophy)    CAD (coronary artery disease) 01/2010   S/P drug eluting stents LAD and PL branch of RCA Dr. 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 On heparin  5000 units subcutaneous q8h, last dose 8/2 1349  Assessment: Pharmacy consulted to manage IV heparin  for ACS/STEMI.  Evaluated today by cardiology for chest pain and increasingly elevated troponins.    Goal of Therapy:  Heparin  level 0.3-0.7 units/ml Monitor platelets by anticoagulation protocol: Yes   Plan:  Discontinue heparin  5000 units subcutaneous q8h Baseline aPTT and PT/INR Initiate IV heparin  at 850 units/hr continuous infusion 8 hour heparin  level Monitor daily heparin  level when titrated to goal, CBC, signs/symptoms of bleeding   Thank you for allowing pharmacy to be a part of this patient's  care.  Eleanor EMERSON Agent, PharmD, BCPS Clinical Pharmacist [place] 10/18/2023 5:59 PM

## 2023-10-18 NOTE — Progress Notes (Addendum)
 PHARMACY - PHYSICIAN COMMUNICATION CRITICAL VALUE ALERT - BLOOD CULTURE IDENTIFICATION (BCID)  Harold [name] is an 89 y.o. male who presented to [facility] on 10/18/2023 with shock/possible septic shock  Assessment: BCID + Group B strep in 1 out of 4 bottles;  MRSA PCR negative; urine culture in process  Name of physician (or Provider) Contacted: Dr Haze  Current antibiotics: Vancomycin  and Cefepime   Changes to prescribed antibiotics recommended:  Continue Vancomycin  Change Cefepime  to Cefazolin  (not ready to narrow antibiotics to PCN only at this time) per Dr Haze  Results for orders placed or performed during the hospital encounter of 10/18/23  Blood Culture ID Panel (Reflexed) (Collected: 10/18/2023  2:05 AM)  Result Value Ref Range   Enterococcus faecalis NOT DETECTED NOT DETECTED   Enterococcus Faecium NOT DETECTED NOT DETECTED   Listeria monocytogenes NOT DETECTED NOT DETECTED   Staphylococcus species NOT DETECTED NOT DETECTED   Staphylococcus aureus (BCID) NOT DETECTED NOT DETECTED   Staphylococcus epidermidis NOT DETECTED NOT DETECTED   Staphylococcus lugdunensis NOT DETECTED NOT DETECTED   Streptococcus species DETECTED (A) NOT DETECTED   Streptococcus agalactiae DETECTED (A) NOT DETECTED   Streptococcus pneumoniae NOT DETECTED NOT DETECTED   Streptococcus pyogenes NOT DETECTED NOT DETECTED   A.calcoaceticus-baumannii NOT DETECTED NOT DETECTED   Bacteroides fragilis NOT DETECTED NOT DETECTED   Enterobacterales NOT DETECTED NOT DETECTED   Enterobacter cloacae complex NOT DETECTED NOT DETECTED   Escherichia coli NOT DETECTED NOT DETECTED   Klebsiella aerogenes NOT DETECTED NOT DETECTED   Klebsiella oxytoca NOT DETECTED NOT DETECTED   Klebsiella pneumoniae NOT DETECTED NOT DETECTED   Proteus species NOT DETECTED NOT DETECTED   Salmonella species NOT DETECTED NOT DETECTED   Serratia marcescens NOT DETECTED NOT DETECTED   Haemophilus influenzae NOT DETECTED NOT  DETECTED   Neisseria meningitidis NOT DETECTED NOT DETECTED   Pseudomonas aeruginosa NOT DETECTED NOT DETECTED   Stenotrophomonas maltophilia NOT DETECTED NOT DETECTED   Candida albicans NOT DETECTED NOT DETECTED   Candida auris NOT DETECTED NOT DETECTED   Candida glabrata NOT DETECTED NOT DETECTED   Candida krusei NOT DETECTED NOT DETECTED   Candida parapsilosis NOT DETECTED NOT DETECTED   Candida tropicalis NOT DETECTED NOT DETECTED   Cryptococcus neoformans/gattii NOT DETECTED NOT DETECTED    Harold [name], PharmD 10/18/2023  11:06 PM

## 2023-10-18 NOTE — IPAL (Signed)
  Interdisciplinary Goals of Care Family Meeting   Date carried out:: 10/18/2023  Location of the meeting: Bedside  Member's involved: Physician, Bedside Registered Nurse, and Family Member or next of kin  Durable Power of Attorney or acting medical decision maker: Patient's daughter Harold [name]  Discussion: We discussed goals of care for Harold [name] .  Reviewed the patient's history, current status, active issues, prognosis with him as well as his daughter Harold [name] at bedside.  He is clear that he wants to get better from this illness, wants all medical interventions, would consider procedures based on their merits.  He is also clear that he would not want extraordinary care, intubation, life support, ACLS.  I will clarify this in the orders and we will continue all medical care.  Code status: Full DNR  Disposition: Continue current acute care   Time spent for the meeting: 15 min  [name] Chris 10/18/2023, 9:59 AM

## 2023-10-18 NOTE — H&P (Addendum)
 NAME:  TORAN MURCH, MRN:  [id number], DOB:  07/03/1934, LOS: 0 ADMISSION DATE:  10/18/2023,  CHIEF COMPLAINT: Fever, chest discomfort  History of Present Illness:  89 year old never smoker with a history of CAD, peroneal vein DVT, BPH, hypertension with chronic HFpEF and follicular/non-Hodgkin lymphoma with a right cervical/facial mass.  Recently admitted for rapidly enlarging right neck mass concerning for active lymphoma.  Core biopsies performed in IR and Port-A-Cath placed 7/30 with plan made to initiate chemotherapy 8/4.  Started on prednisone  postbiopsy.  He was discharged on 10/16/2023. He was brought to ED for evaluation of chest discomfort, fever early morning 8/2.  He received aspirin , Nitropaste.  Troponin trend 18 > 48, ECG without any clear evidence for ischemia.  Lactate 2.6 > 2.7  > 13.4 (question spurious) > 2.6.  He was started on norepinephrine  in the ED, currently 5.  Received broad-spectrum empiric antibiotics with cefepime  and vancomycin , 2.5 L volume resuscitation.  Chest x-ray early 8/2 showed no infiltrates, no pneumothorax, right chest wall Port-A-Cath in good position.  Pertinent  Medical History   Past Medical History:  Diagnosis Date   BPH (benign prostatic hypertrophy)    CAD (coronary artery disease) 01/2010   S/P drug eluting stents LAD and PL branch of RCA Dr. 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   follicular lymphoma dx'd 06/2017   Hernia    HLD (hyperlipidemia)    HTN (hypertension)     Significant Hospital Events: Including procedures, antibiotic start and stop dates in addition to other pertinent events   Neck CT 7/29 >> large right neck and transfacial mass up to 12 cm long at the right mandible, adjacent level 1A lymph node that has progressed compared with prior imaging TTE 7/29 >> preserved LV function Blood cultures 8/2 > Urine culture 8/2  >   Interim History / Subjective:  Feels better Blood  pressure stabilizing on norepinephrine  5  Objective    Blood pressure (!) 96/48, pulse 78, temperature 97.8 F (36.6 C), temperature source Oral, resp. rate 12, height 5' 6 (1.676 m), weight 70.3 kg, SpO2 100%.        Intake/Output Summary (Last 24 hours) at 10/18/2023 0931 Last data filed at 10/18/2023 0501 Gross per 24 hour  Intake 2630.08 ml  Output --  Net 2630.08 ml   Filed Weights   10/18/23 0158  Weight: 70.3 kg    Examination: General: Pleasant elderly man laying in bed in no distress HENT: Large right facial, mandibular, upper neck mass.  Not tender.  Evidence for wall abnormality in the oropharynx without any ulceration or bleeding.  Tongue is midline.  Strong clear voice.  No evidence of difficulty with secretions Lungs: Clear bilaterally without wheezing or crackles Cardiovascular: Regular, distant but no murmur heard. Abdomen: Soft, nondistended with positive bowel sounds Extremities: Bilateral lower extremity edema 2-3+, more on the right.  Erythema on the anterior aspect of the right mid shin Neuro: Awake, alert, appropriate.  Decreased hearing.  Fully oriented, answers questions appropriately and moves extremities with good strength Skin: Right Port-A-Cath site with some bruising, healing incision without any bleeding, purulence, pain GU: Unremarkable  Resolved problem list   Assessment and Plan   Shock, question septic shock with no clear source yet. Consider component hypovolemic shock as well. CO was intact on recent TTE -Follow lactic acid for clearance - Follow blood cultures, urine culture data.  Chest x-ray reassuring -Procalcitonin - Empiric  antibiotics cefepime  and vancomycin  - Careful with volume resuscitation given his HFpEF.  Chest x-ray clear at admission, remains on room air.  Should be able to tolerate further volume resuscitation at least at this time  CAD w remote PTCI/stent NSTEMI, suspect stress-related in setting  hypotension Hyperlipidemia - Follow troponin trend, ECG and telemetry - Will involve cardiology if ECG evolves or if significant troponin bump.  Suspect that this was secondary to acute hypotension - Continue Pravachol   Follicular / Non-Hodgkins lymphoma with evidence recent rapid progression - Core needle biopsies from 7/30 are still pending - Port-A-Cath in place.  Site looks good - Was planning to initiate chemotherapy 8/4 > etoposide  and prednisone  are both on his medicine list  LE edema / erythema, Hx LE peroneal DVT off AC -doppler US  now  Hx HTN Mild aortic stenosis Chronic HFPEF -Hold atenolol  and furosemide  given his admission hemodynamics  BPH -Finasteride   Anemia of chronic disease -Follow intermittent CBC -Hemoglobin goal 7.0  Gout - On allopurinol   Glaucoma Chronic visual impairment right eye - Alphagan  ophthalmic solution   Best Practice (right click and Reselect all SmartList Selections daily)   Diet/type: Regular consistency (see orders) DVT prophylaxis prophylactic heparin   Pressure ulcer(s): N/A GI prophylaxis: PPI Lines: Central line > right port Foley:  N/A Code Status:  DNR, all medical care appropriate Last date of multidisciplinary goals of care discussion [discussed 8/2.  Please refer also to IPAL note]  Labs   CBC: Recent Labs  Lab 10/13/23 1212 10/16/23 0700 10/18/23 0205  WBC 5.0 4.9 9.5  NEUTROABS 2.8  --  8.7*  HGB 11.0* 10.6* 11.7*  HCT 34.2* 34.6* 36.5*  MCV 85.7 88.3 87.3  PLT 269 260 235    Basic Metabolic Panel: Recent Labs  Lab 10/13/23 1212 10/16/23 0700 10/18/23 0205  NA 143 139 141  K 3.6 4.3 3.3*  CL 103 101 101  CO2 35* 27 26  GLUCOSE 85 157* 99  BUN 22 22 35*  CREATININE 1.00 0.72 0.77  CALCIUM 8.9 8.9 9.5  MG  --  1.9  --    GFR: Estimated Creatinine Clearance: 59.8 mL/min (by C-G formula based on SCr of 0.77 mg/dL). Recent Labs  Lab 10/13/23 1212 10/16/23 0700 10/18/23 0205 10/18/23 0210  10/18/23 0423 10/18/23 0630 10/18/23 0748  WBC 5.0 4.9 9.5  --   --   --   --   LATICACIDVEN  --   --   --  2.6* 2.7* 13.4* 2.6*    Liver Function Tests: Recent Labs  Lab 10/13/23 1212  AST 15  ALT 10  ALKPHOS 53  BILITOT 0.5  PROT 6.1*  ALBUMIN 3.6   No results for input(s): LIPASE, AMYLASE in the last 168 hours. No results for input(s): AMMONIA in the last 168 hours.  ABG No results found for: PHART, PCO2ART, PO2ART, HCO3, TCO2, ACIDBASEDEF, O2SAT   Coagulation Profile: Recent Labs  Lab 10/15/23 0653  INR 1.1    Cardiac Enzymes: No results for input(s): CKTOTAL, CKMB, CKMBINDEX, TROPONINI in the last 168 hours.  HbA1C: No results found for: HGBA1C  CBG: No results for input(s): GLUCAP in the last 168 hours.  Review of Systems:   Feels better.  No chest pain at this time  Past Medical History:  He,  has a past medical history of BPH (benign prostatic hypertrophy), CAD (coronary artery disease) (01/2010), DVT (deep venous thrombosis) (HCC), follicular lymphoma (dx'd 06/2017), Hernia, HLD (hyperlipidemia), and HTN (hypertension).   Surgical History:  Past Surgical History:  Procedure Laterality Date   BACK SURGERY     CORONARY ANGIOGRAPHY N/A 01/29/2023   Procedure: CORONARY ANGIOGRAPHY;  Surgeon: Verlin Lonni BIRCH, MD;  Location: [facility];  Service: Cardiovascular;  Laterality: N/A;   CORONARY BALLOON ANGIOPLASTY N/A 01/29/2023   Procedure: CORONARY BALLOON ANGIOPLASTY;  Surgeon: Verlin Lonni BIRCH, MD;  Location: [facility];  Service: Cardiovascular;  Laterality: N/A;   CORONARY LITHOTRIPSY N/A 01/29/2023   Procedure: CORONARY LITHOTRIPSY;  Surgeon: Verlin Lonni BIRCH, MD;  Location: [facility];  Service: Cardiovascular;  Laterality: N/A;   CYSTOSCOPY WITH LITHOLAPAXY N/A 05/20/2019   Procedure: CYSTOSCOPY WITH LITHOLAPAXY;  Surgeon: Cam Morene ORN, MD;  Location: [facility];  Service:  Urology;  Laterality: N/A;   CYSTOSCOPY/URETEROSCOPY/HOLMIUM LASER/STENT PLACEMENT Left 05/20/2019   Procedure: CYSTOSCOPY/URETEROSCOPY/HOLMIUM LASER/STENT PLACEMENT;  Surgeon: Cam Morene ORN, MD;  Location: [facility];  Service: Urology;  Laterality: Left;   HERNIA REPAIR  01/08/11   RIH   IR IMAGING GUIDED PORT INSERTION  10/15/2023   IR US  GUIDE BX ASP/DRAIN  10/15/2023   MASS EXCISION Right 08/05/2017   Procedure: EXCISION RIGHT POSTERIOR  NECK LYMPH NODE;  Surgeon: Ethyl Lonni BRAVO, MD;  Location: [facility];  Service: ENT;  Laterality: Right;     Social History:   reports that he has never smoked. He has never used smokeless tobacco. He reports that he does not drink alcohol and does not use drugs.   Family History:  His family history includes Heart failure in his mother.   Allergies No Known Allergies   Home Medications  Prior to Admission medications   Medication Sig Start Date End Date Taking? Authorizing Provider  acyclovir  (ZOVIRAX ) 400 MG tablet Take 1 tablet (400 mg total) by mouth daily. 10/15/23   [name], MD  allopurinol  (ZYLOPRIM ) 100 MG tablet Take 1 tablet (100 mg total) by mouth 2 (two) times daily. 09/04/23   [name], MD  atenolol  (TENORMIN ) 25 MG tablet TAKE 1 TABLET BY MOUTH DAILY 10/02/23   Verlin Lonni BIRCH, MD  brimonidine  (ALPHAGAN ) 0.2 % ophthalmic solution Place 1 drop into the right eye 2 (two) times daily.    [provider]  cholecalciferol  (VITAMIN D ) 1000 UNITS tablet Take 1,000 Units by mouth daily.    [provider]  Cyanocobalamin  (B-12) 2500 MCG TABS Take 2,500 mcg by mouth daily.    [provider]  etoposide  (VEPESID ) 50 MG capsule Take 2 capsules (100 mg total) by mouth See admin instructions. On Day 2 and Day 3 of each cycle of R-CEOP 10/17/23   [name], MD  finasteride  (PROSCAR ) 5 MG tablet Take 5 mg by mouth daily.    [provider]  furosemide   (LASIX ) 20 MG tablet TAKE 1 TABLET BY MOUTH TWICE  DAILY 02/19/22   Parthenia Olivia HERO, PA-C  gabapentin  (NEURONTIN ) 100 MG capsule Take 1 capsule by mouth 2 (two) times daily as needed (for knee pain). 10/24/21   [provider]  lidocaine -prilocaine  (EMLA ) cream Apply to affected area once 10/15/23   [name], MD  nitroGLYCERIN  (NITROSTAT ) 0.4 MG SL tablet Place 1 tablet (0.4 mg total) under the tongue every 5 (five) minutes as needed for chest pain. 09/29/23 01/16/25  Verlin Lonni BIRCH, MD  ondansetron  (ZOFRAN ) 8 MG tablet Take 1 tablet (8 mg total) by mouth every 8 (eight) hours as needed for nausea or vomiting. Start on day 3 after  cyclophosphamide  chemotherapy. 10/15/23   Kale, Gautam Kishore, MD  potassium chloride  SA (KLOR-CON  M) 20 MEQ tablet Take 1 tablet (20 mEq total) by mouth daily. Please keep upcoming appointment for future refills. Thank you. Patient taking differently: Take 10 mEq by mouth 2 (two) times daily. Please keep upcoming appointment for future refills. Thank you. Take with fluid pill. 11/23/22   Odell Celinda Balo, MD  pravastatin  (PRAVACHOL ) 40 MG tablet Take 1 tablet (40 mg total) by mouth at bedtime. 12/26/21   Verlin Lonni BIRCH, MD  predniSONE  (DELTASONE ) 20 MG tablet Take 3 tablets (60 mg total) by mouth daily. Take on days 2-5 of chemotherapy. 10/15/23   Kale, Gautam Kishore, MD  prochlorperazine  (COMPAZINE ) 10 MG tablet Take 1 tablet (10 mg total) by mouth every 6 (six) hours as needed for nausea or vomiting. 10/15/23   [name], MD     Critical care time: 45 min     Lamar Chris, MD, PhD 10/18/2023, 9:31 AM [facility] [phone] or if no answer before 7:00PM call [phone] For any issues after 7:00PM please call eLink [phone]

## 2023-10-18 NOTE — Sepsis Progress Note (Addendum)
 Notified provider of need to order repeat lactic acid and fluid bolus.  Dr. Jerral responded, states patient has h/o CHF.  Messaged RN to verify VS for MD to review.

## 2023-10-18 NOTE — Sepsis Progress Note (Signed)
 Elink monitoring for the code sepsis protocol.

## 2023-10-18 NOTE — Progress Notes (Signed)
 eLink Physician-Brief Progress Note Patient Name: Harold [name] DOB: 1935-02-24 MRN: [id number]   Date of Service  10/18/2023  HPI/Events of Note  Notified of BC ID positive group B strep blood cultures  eICU Interventions  Switch from cefepime  and vancomycin  to cefazolin  and vancomycin .  Repeat blood cultures in the morning.  Echocardiogram pending.     Intervention Category Intermediate Interventions: Infection - evaluation and management  Kirsta Probert 10/18/2023, 11:05 PM

## 2023-10-18 NOTE — Sepsis Progress Note (Signed)
 Notified bedside nurse of need to draw repeat lactic acid.

## 2023-10-18 NOTE — Progress Notes (Signed)
 VASCULAR LAB    Bilateral lower extremity venous duplex has been performed.  See CV proc for preliminary results.   Azile Minardi, RVT 10/18/2023, 4:23 PM

## 2023-10-18 NOTE — Discharge Instructions (Addendum)
 Information on my medicine - ELIQUIS  (apixaban )  This medication education was reviewed with me or my healthcare representative as part of my discharge preparation.   Why was Eliquis  prescribed for you? Eliquis  was prescribed to treat blood clots that may have been found in the veins of your legs (deep vein thrombosis) or in your lungs (pulmonary embolism) and to reduce the risk of them occurring again.  What do You need to know about Eliquis  ? The dose is ONE 5 mg tablet taken TWICE daily.  Eliquis  may be taken with or without food.   Try to take the dose about the same time in the morning and in the evening. If you have difficulty swallowing the tablet whole please discuss with your pharmacist how to take the medication safely.  Take Eliquis  exactly as prescribed and DO NOT stop taking Eliquis  without talking to the doctor who prescribed the medication.  Stopping may increase your risk of developing a new blood clot.  Refill your prescription before you run out.  After discharge, you should have regular check-up appointments with your healthcare provider that is prescribing your Eliquis .    What do you do if you miss a dose? If a dose of ELIQUIS  is not taken at the scheduled time, take it as soon as possible on the same day and twice-daily administration should be resumed. The dose should not be doubled to make up for a missed dose.  Important Safety Information A possible side effect of Eliquis  is bleeding. You should call your healthcare provider right away if you experience any of the following: Bleeding from an injury or your nose that does not stop. Unusual colored urine (red or dark brown) or unusual colored stools (red or black). Unusual bruising for unknown reasons. A serious fall or if you hit your head (even if there is no bleeding).  Some medicines may interact with Eliquis  and might increase your risk of bleeding or clotting while on Eliquis . To help avoid this,  consult your healthcare provider or pharmacist prior to using any new prescription or non-prescription medications, including herbals, vitamins, non-steroidal anti-inflammatory drugs (NSAIDs) and supplements.  This website has more information on Eliquis  (apixaban ): http://[email or web address] .com/eliquis dena

## 2023-10-18 NOTE — Progress Notes (Signed)
 Pharmacy Antibiotic Note  Harold [name] is a 89 y.o. male admitted on 10/18/2023 with sepsis. Pharmacy has been consulted for vancomycin  and cefepime  dosing.  Plan: -Vancomycin  1500 mg IV x 1 given. Continue with 1250 mg IV q24h -Cefepime  2 g IV q12h  Height: 5' 6 (167.6 cm) Weight: 70.3 kg (155 lb) IBW/kg (Calculated) : 63.8  Temp (24hrs), Avg:98.5 F (36.9 C), Min:97.8 F (36.6 C), Max:98.9 F (37.2 C)  Recent Labs  Lab 10/13/23 1212 10/16/23 0700 10/18/23 0205 10/18/23 0210 10/18/23 0423 10/18/23 0630 10/18/23 0748  WBC 5.0 4.9 9.5  --   --   --   --   CREATININE 1.00 0.72 0.77  --   --   --   --   LATICACIDVEN  --   --   --  2.6* 2.7* 13.4* 2.6*    Estimated Creatinine Clearance: 59.8 mL/min (by C-G formula based on SCr of 0.77 mg/dL).    No Known Allergies  Antimicrobials this admission: Cefepime  8/2 >> Vancomycin  8/2 >>  Dose adjustments this admission: NA  Microbiology results: 8/2 BCx: ngtd 8/2 UCx: pending  8/2 MRSA PCR: pending  Thank you for allowing pharmacy to be a part of this patient's care.  Stefano MARLA Bologna, PharmD, BCPS Clinical Pharmacist 10/18/2023 9:35 AM

## 2023-10-19 ENCOUNTER — Inpatient Hospital Stay (HOSPITAL_COMMUNITY)

## 2023-10-19 DIAGNOSIS — R578 Other shock: Secondary | ICD-10-CM | POA: Diagnosis not present

## 2023-10-19 DIAGNOSIS — R6521 Severe sepsis with septic shock: Secondary | ICD-10-CM | POA: Diagnosis not present

## 2023-10-19 DIAGNOSIS — I2510 Atherosclerotic heart disease of native coronary artery without angina pectoris: Secondary | ICD-10-CM | POA: Diagnosis not present

## 2023-10-19 DIAGNOSIS — I214 Non-ST elevation (NSTEMI) myocardial infarction: Secondary | ICD-10-CM | POA: Diagnosis not present

## 2023-10-19 DIAGNOSIS — R7989 Other specified abnormal findings of blood chemistry: Secondary | ICD-10-CM | POA: Diagnosis not present

## 2023-10-19 DIAGNOSIS — B951 Streptococcus, group B, as the cause of diseases classified elsewhere: Secondary | ICD-10-CM | POA: Diagnosis not present

## 2023-10-19 DIAGNOSIS — R079 Chest pain, unspecified: Secondary | ICD-10-CM

## 2023-10-19 DIAGNOSIS — E876 Hypokalemia: Secondary | ICD-10-CM

## 2023-10-19 DIAGNOSIS — E878 Other disorders of electrolyte and fluid balance, not elsewhere classified: Secondary | ICD-10-CM

## 2023-10-19 DIAGNOSIS — A419 Sepsis, unspecified organism: Secondary | ICD-10-CM | POA: Diagnosis not present

## 2023-10-19 LAB — ECHOCARDIOGRAM COMPLETE
AR max vel: 1.27 cm2
AV Area VTI: 1.32 cm2
AV Area mean vel: 1.23 cm2
AV Mean grad: 11.7 mmHg
AV Peak grad: 20.4 mmHg
Ao pk vel: 2.26 m/s
Area-P 1/2: 3.17 cm2
Height: 66 in
MV VTI: 1.32 cm2
S' Lateral: 2.5 cm
Weight: 2462.1 [oz_av]

## 2023-10-19 LAB — LACTIC ACID, PLASMA: Lactic Acid, Venous: 1.7 mmol/L (ref 0.5–1.9)

## 2023-10-19 LAB — MAGNESIUM
Magnesium: 1.6 mg/dL — ABNORMAL LOW (ref 1.7–2.4)
Magnesium: 2.4 mg/dL (ref 1.7–2.4)

## 2023-10-19 LAB — BASIC METABOLIC PANEL WITH GFR
Anion gap: 8 (ref 5–15)
Anion gap: 7 (ref 5–15)
BUN: 27 mg/dL — ABNORMAL HIGH (ref 8–23)
BUN: 22 mg/dL (ref 8–23)
CO2: 23 mmol/L (ref 22–32)
CO2: 27 mmol/L (ref 22–32)
Calcium: 8.2 mg/dL — ABNORMAL LOW (ref 8.9–10.3)
Calcium: 8.3 mg/dL — ABNORMAL LOW (ref 8.9–10.3)
Chloride: 108 mmol/L (ref 98–111)
Chloride: 104 mmol/L (ref 98–111)
Creatinine, Ser: 0.85 mg/dL (ref 0.61–1.24)
Creatinine, Ser: 0.94 mg/dL (ref 0.61–1.24)
GFR, Estimated: >60 mL/min (ref >60)
GFR, Estimated: >60 mL/min (ref >60)
Glucose, Bld: 122 mg/dL — ABNORMAL HIGH (ref 70–99)
Glucose, Bld: 98 mg/dL (ref 70–99)
Potassium: 3.2 mmol/L — ABNORMAL LOW (ref 3.5–5.1)
Potassium: 3.7 mmol/L (ref 3.5–5.1)
Sodium: 139 mmol/L (ref 135–145)
Sodium: 138 mmol/L (ref 135–145)

## 2023-10-19 LAB — CBC
HCT: 30.3 % — ABNORMAL LOW (ref 39.0–52.0)
Hemoglobin: 9.3 g/dL — ABNORMAL LOW (ref 13.0–17.0)
MCH: 27.5 pg (ref 26.0–34.0)
MCHC: 30.7 g/dL (ref 30.0–36.0)
MCV: 89.6 fL (ref 80.0–100.0)
Platelets: 199 K/uL (ref 150–400)
RBC: 3.38 MIL/uL — ABNORMAL LOW (ref 4.22–5.81)
RDW: 15.1 % (ref 11.5–15.5)
WBC: 12.6 K/uL — ABNORMAL HIGH (ref 4.0–10.5)
nRBC: 0 % (ref 0.0–0.2)

## 2023-10-19 LAB — PHOSPHORUS
Phosphorus: 1.7 mg/dL — ABNORMAL LOW (ref 2.5–4.6)
Phosphorus: 2.3 mg/dL — ABNORMAL LOW (ref 2.5–4.6)

## 2023-10-19 LAB — PROCALCITONIN: Procalcitonin: 2.63 ng/mL

## 2023-10-19 LAB — URINE CULTURE: Culture: NO GROWTH

## 2023-10-19 LAB — HEPARIN LEVEL (UNFRACTIONATED)
Heparin Unfractionated: <0.1 [IU]/mL — ABNORMAL LOW (ref 0.30–0.70)
Heparin Unfractionated: 0.17 [IU]/mL — ABNORMAL LOW (ref 0.30–0.70)

## 2023-10-19 LAB — TROPONIN I (HIGH SENSITIVITY): Troponin I (High Sensitivity): 539 ng/L (ref <18)

## 2023-10-19 MED ORDER — POTASSIUM PHOSPHATES 15 MMOLE/5ML IV SOLN
30.0000 mmol | Freq: Once | INTRAVENOUS | Status: AC
  Administered 2023-10-19: 30 mmol via INTRAVENOUS
  Filled 2023-10-19: qty 10

## 2023-10-19 MED ORDER — POTASSIUM CHLORIDE CRYS ER 20 MEQ PO TBCR
20.0000 meq | EXTENDED_RELEASE_TABLET | Freq: Once | ORAL | Status: AC
  Administered 2023-10-19: 20 meq via ORAL
  Filled 2023-10-19: qty 1

## 2023-10-19 MED ORDER — HEPARIN BOLUS VIA INFUSION
2000.0000 [IU] | Freq: Once | INTRAVENOUS | Status: AC
  Administered 2023-10-19: 2000 [IU] via INTRAVENOUS
  Filled 2023-10-19: qty 2000

## 2023-10-19 MED ORDER — ASPIRIN 81 MG PO TBEC
81.0000 mg | DELAYED_RELEASE_TABLET | Freq: Every day | ORAL | Status: DC
  Administered 2023-10-19 – 2023-10-25 (×7): 81 mg via ORAL
  Filled 2023-10-19 (×7): qty 1

## 2023-10-19 MED ORDER — LACTATED RINGERS IV BOLUS: 1000.0000 mL | Freq: Once | INTRAVENOUS | Status: DC

## 2023-10-19 MED ORDER — LACTATED RINGERS IV BOLUS
500.0000 mL | Freq: Once | INTRAVENOUS | Status: AC
  Administered 2023-10-19: 500 mL via INTRAVENOUS

## 2023-10-19 MED ORDER — SODIUM CHLORIDE 0.9% FLUSH: 10.0000 mL | INTRAVENOUS | Status: DC | PRN

## 2023-10-19 MED ORDER — MAGNESIUM SULFATE 4 GM/100ML IV SOLN
4.0000 g | Freq: Once | INTRAVENOUS | Status: AC
  Administered 2023-10-19: 4 g via INTRAVENOUS
  Filled 2023-10-19: qty 100

## 2023-10-19 MED ORDER — SODIUM CHLORIDE 0.9% FLUSH
10.0000 mL | Freq: Two times a day (BID) | INTRAVENOUS | Status: DC
  Administered 2023-10-19: 20 mL
  Administered 2023-10-21: 10 mL
  Administered 2023-10-21 – 2023-10-22 (×2): 20 mL
  Administered 2023-10-22 – 2023-10-25 (×5): 10 mL

## 2023-10-19 MED ORDER — POTASSIUM PHOSPHATES 15 MMOLE/5ML IV SOLN
30.0000 mmol | Freq: Once | INTRAVENOUS | Status: DC
  Filled 2023-10-19: qty 10

## 2023-10-19 NOTE — Progress Notes (Signed)
 Progress Note  Patient Name: Harold [name] Date of Encounter: 10/19/2023  [facility] Cardiologist: Lonni Cash, MD   Patient Profile     Subjective   Denies any further chest pain this morning.  Troponin peaked at 735 and is trending downward.  2D echo still pending  Inpatient Medications    Scheduled Meds:  acyclovir   400 mg Oral Daily   allopurinol   100 mg Oral BID   brimonidine   1 drop Right Eye BID   Chlorhexidine  Gluconate Cloth  6 each Topical Daily   cholecalciferol   1,000 Units Oral Daily   cyanocobalamin   2,500 mcg Oral Daily   finasteride   5 mg Oral Daily   pantoprazole   40 mg Oral Daily   pravastatin   40 mg Oral QHS   Continuous Infusions:   ceFAZolin  (ANCEF ) IV Stopped (10/19/23 9362)   heparin  1,050 Units/hr (10/19/23 0600)   magnesium  sulfate bolus IVPB 4 g (10/19/23 0647)   norepinephrine  (LEVOPHED ) Adult infusion 2 mcg/min (10/19/23 0600)   potassium PHOSPHATE  IVPB (in mmol)     vancomycin      PRN Meds: docusate sodium , gabapentin , ondansetron , mouth rinse, polyethylene glycol   Vital Signs    Vitals:   10/19/23 0715 10/19/23 0730 10/19/23 0745 10/19/23 0800  BP: (!) 117/47 (!) 119/49 (!) 113/54 (!) 120/45  Pulse: (!) 59 63 (!) 56 66  Resp: 19 19 10 17   Temp:      TempSrc:      SpO2: 99% 97% 100% 92%  Weight:      Height:        Intake/Output Summary (Last 24 hours) at 10/19/2023 0814 Last data filed at 10/19/2023 0600 Gross per 24 hour  Intake 3569.67 ml  Output 1400 ml  Net 2169.67 ml      10/19/2023    4:39 AM 10/18/2023    1:58 AM 10/13/2023    3:35 PM  Last 3 Weights  Weight (lbs) 153 lb 14.1 oz 155 lb 155 lb 5 oz  Weight (kg) 69.8 kg 70.308 kg 70.45 kg      Telemetry    Normal sinus rhythm- Personally Reviewed  ECG    No new EKG to review- Personally Reviewed  Physical Exam   GEN: No acute distress.   Neck: No JVD Cardiac: RRR, no murmurs, rubs, or gallops.  Respiratory: Clear to auscultation  bilaterally. GI: Soft, nontender, non-distended  MS: 2+ bilateral lower extremity edema with significant erythema consistent with cellulitis; No deformity. Neuro:  Nonfocal  Psych: Normal affect   Labs    High Sensitivity Troponin:   Recent Labs  Lab 10/18/23 0947 10/18/23 1128 10/18/23 1543 10/18/23 1929 10/18/23 2335  TROPONINIHS 421* 610* 735* 575* 539*      Chemistry Recent Labs  Lab 10/13/23 1212 10/16/23 0700 10/18/23 0205 10/19/23 0242  NA 143 139 141 139  K 3.6 4.3 3.3* 3.2*  CL 103 101 101 108  CO2 35* 27 26 23   GLUCOSE 85 157* 99 122*  BUN 22 22 35* 27*  CREATININE 1.00 0.72 0.77 0.85  CALCIUM 8.9 8.9 9.5 8.2*  PROT 6.1*  --   --   --   ALBUMIN 3.6  --   --   --   AST 15  --   --   --   ALT 10  --   --   --   ALKPHOS 53  --   --   --   BILITOT 0.5  --   --   --  GFRNONAA >60 >60 >60 >60  ANIONGAP 5 11 14 8      Hematology Recent Labs  Lab 10/16/23 0700 10/18/23 0205 10/19/23 0242  WBC 4.9 9.5 12.6*  RBC 3.92* 4.18* 3.38*  HGB 10.6* 11.7* 9.3*  HCT 34.6* 36.5* 30.3*  MCV 88.3 87.3 89.6  MCH 27.0 28.0 27.5  MCHC 30.6 32.1 30.7  RDW 14.6 14.7 15.1  PLT 260 235 199    BNP Recent Labs  Lab 10/18/23 0947  BNP 625.0*     DDimer No results for input(s): DDIMER in the last 168 hours.    Radiology    VAS US  LOWER EXTREMITY VENOUS (DVT) Result Date: 10/18/2023  Lower Venous DVT Study Patient Name:  ALONZO OWCZARZAK  Date of Exam:   10/18/2023 Medical Rec #: [id number]       Accession #:    [id number] Date of Birth: January 04, 1935       Patient Gender: M Patient Age:   89 years Exam Location:  [facility] Procedure:      VAS US  LOWER EXTREMITY VENOUS (DVT) Referring Phys: LAMAR CHRIS --------------------------------------------------------------------------------  Indications: Right > L Edema.  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 Chronic HFpEF. Limitations:  Pitting edema, involuntary patient movement. Comparison Study: Prior bilateral LEV done 05/05/20 indicating chronic left                   peroneal DVT Performing Technologist: Alberta Lis RVS  Examination Guidelines: A complete evaluation includes B-mode imaging, spectral Doppler, color Doppler, and power Doppler as needed of all accessible portions of each vessel. Bilateral testing is considered an integral part of a complete examination. Limited examinations for reoccurring indications may be performed as noted. The reflux portion of the exam is performed with the patient in reverse Trendelenburg.  +---------+---------------+---------+-----------+----------+--------------+ RIGHT    CompressibilityPhasicitySpontaneityPropertiesThrombus Aging +---------+---------------+---------+-----------+----------+--------------+ CFV      Full           Yes      No                                  +---------+---------------+---------+-----------+----------+--------------+ SFJ      Full                                                        +---------+---------------+---------+-----------+----------+--------------+ FV Prox  Full           Yes      No                                  +---------+---------------+---------+-----------+----------+--------------+ FV Mid   Full                                                        +---------+---------------+---------+-----------+----------+--------------+ FV DistalFull                                                        +---------+---------------+---------+-----------+----------+--------------+  PFV      Full           Yes      No                                  +---------+---------------+---------+-----------+----------+--------------+ POP      Full           Yes      No                                  +---------+---------------+---------+-----------+----------+--------------+ PTV      Full                                                         +---------+---------------+---------+-----------+----------+--------------+ PERO     Full                                                        +---------+---------------+---------+-----------+----------+--------------+   +---------+---------------+---------+-----------+----------+-------------------+ LEFT     CompressibilityPhasicitySpontaneityPropertiesThrombus Aging      +---------+---------------+---------+-----------+----------+-------------------+ CFV      Full           Yes      No                                       +---------+---------------+---------+-----------+----------+-------------------+ SFJ      Full                                                             +---------+---------------+---------+-----------+----------+-------------------+ FV Prox  Full           Yes      No                                       +---------+---------------+---------+-----------+----------+-------------------+ FV Mid   Full                                                             +---------+---------------+---------+-----------+----------+-------------------+ FV DistalFull           Yes      No                                       +---------+---------------+---------+-----------+----------+-------------------+ PFV      Full                                                             +---------+---------------+---------+-----------+----------+-------------------+  POP                     Yes      No                   patent by color and                                                       Doppler             +---------+---------------+---------+-----------+----------+-------------------+ PTV      None                                         Age Indeterminate   +---------+---------------+---------+-----------+----------+-------------------+ PERO     None                                         Age  Indeterminate   +---------+---------------+---------+-----------+----------+-------------------+    Summary: RIGHT: - There is no evidence of deep vein thrombosis in the lower extremity.  Interstitial edema noted throughout the extremity  LEFT: - Findings consistent with age indeterminate deep vein thrombosis involving the left posterior tibial veins, and left peroneal veins.  Interstitial edema noted throughout the extremity.  *See table(s) above for measurements and observations.    Preliminary    DG Chest Port 1 View Result Date: 10/18/2023 EXAM: 1 VIEW XRAY OF THE CHEST 10/18/2023 02:13:00 AM COMPARISON: 01/01/2011 CLINICAL HISTORY: 355200 Chest pain [id number]. Per chart: Patient BIB EMS for chest pain and fever that started last night. Patient was seen here earlier this week and had port implanted for chemotherapy related to Non-Hodgkin's lymphoma. Patient given 324 ASA and 1 inch nitropaste from EMS. Denies any pain at this time. FINDINGS: LUNGS AND PLEURA: No focal pulmonary opacity. No pulmonary edema. No pleural effusion. No pneumothorax. HEART AND MEDIASTINUM: No acute abnormality of the cardiac and mediastinal silhouettes. BONES AND SOFT TISSUES: No acute osseous abnormality. Right chest wall port-a-cath with tip in the lower SVC. IMPRESSION: 1. No acute process. Electronically signed by: Franky Stanford MD 10/18/2023 02:24 AM EDT RP Workstation: [id number]    Patient Profile     89 y.o. male with a hx of hypertension, CAD status post PCI of LAD  and RPLA, DVT, follicular lymphoma, hypertension and hyperlipidemia who is being seen for the evaluation of chest pain at the request of Lamar Chris, MD.   Assessment & Plan    #ASCAD #Elevated troponin #Chest pain - He has a history of CAD with cath 01/29/2023: 60% ostial to prox RCA, 30% mid RCA, 40% prox LCx, 80% mid LAD proximal to prior LAD stent, 30% ISR of mid LAD stent , 50% dLAD and then occluded dLAD status post intracoronary lithotripsy of  heavily calcified LAD with suboptimal expansion with balloon and therefore stent was not placed - 2D echo 10/14/2023 for workup for initiation of chemotherapy for NHL showed normal LV function - Suspect this is demand ischemia in the setting of sepsis, hypotension and tachycardia as well as no significant CAD with recent  failed PCI of an occluded distal LAD but given trop  trend could represent an ACS - High-sensitivity troponin initially 18 on admission And peaked at 735 and trending downward - He has not had any further chest pain or shortness of breath - Medical management of CAD difficult due to hypotension and need for Levophed  drip for BP support - BP improved today to 120/45 - Restart beta-blocker once hemodynamically more stable - Continue pravastatin  40 mg daily and IV Heparin  gtt - Patient had not been on aspirin  outpatient due to DOAC but that has been stopped and will add on aspirin  81 mg daily given his underlying CAD - 2D echo pending -Wean Levophed  as BP allows   #Septic shock - Currently on vasopressors with Levophed  - Blood cultures positive for group B strep - Since his admission he has developed bilateral lower extremity erythema and swelling of his legs consistent with cellulitis - WBC up to 12.6 today - Chest x-ray is normal and UA clear - Further workup per critical care   #Non-Hodgkin's lymphoma - 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 - ?Plan is to initiate chemo on 10/20/2023 but will likely be delayed in the setting of sepsis and bacteremia   #Hypertension - PTA beta-blocker currently on hold and on Levophed  for pressor support for septic shock   Chronic diastolic CHF - Does not appear volume overloaded on exam today except for significant LE edema that he has had for months - 3+ BLE edema on exam today in setting of what appears to be BLE cellulitis - Will have to watch for volume overload as he has gotten fluid  resuscitation for his septic shock - consider abdominal and pelvic CT to assess for recurrent adenopathy that could be impeding venous return and resulting in LE edema>>his daughter says that he had XRT for extensive retroperitoneal/ pelvic and abdominal lymphadenopathy in Sept 2024  Hypokalemia - Potassium 3.2 today - Repleting per CCM to keep greater than 4 - Magnesium  also low at 1.6 with goal of greater than 2   Performed by: Wilbert Bihari, MD   Total critical care time: 35 minutes   Critical care time was exclusive of separately billable procedures and treating other patients.   Critical care was necessary to treat or prevent imminent or life-threatening deterioration.   Critical care was time spent personally by me (independent of APPs or residents) on the following activities: development of treatment plan with patient and/or surrogate as well as nursing, discussions with consultants, evaluation of patient's response to treatment, examination of patient, obtaining history from patient or surrogate, ordering and performing treatments and interventions, ordering and review of laboratory studies, ordering and review of radiographic studies, pulse oximetry and re-evaluation of patient's condition.   For questions or updates, please contact [facility] Please consult [email or web address] for contact info under        Signed, Wilbert Bihari, MD  10/19/2023, 8:14 AM

## 2023-10-19 NOTE — Progress Notes (Signed)
 NAME:  Harold [name], MRN:  [id number], DOB:  Feb 10, 1935, LOS: 1 ADMISSION DATE:  10/18/2023,  CHIEF COMPLAINT: Fever, chest discomfort  History of Present Illness:  89 year old never smoker with a history of CAD, peroneal vein DVT, BPH, hypertension with chronic HFpEF and follicular/non-Hodgkin lymphoma with a right cervical/facial mass.  Recently admitted for rapidly enlarging right neck mass concerning for active lymphoma.  Core biopsies performed in IR and Port-A-Cath placed 7/30 with plan made to initiate chemotherapy 8/4.  Started on prednisone  postbiopsy.  He was discharged on 10/16/2023. He was brought to ED for evaluation of chest discomfort, fever early morning 8/2.  He received aspirin , Nitropaste.  Troponin trend 18 > 48, ECG without any clear evidence for ischemia.  Lactate 2.6 > 2.7  > 13.4 (question spurious) > 2.6.  He was started on norepinephrine  in the ED, currently 5.  Received broad-spectrum empiric antibiotics with cefepime  and vancomycin , 2.5 L volume resuscitation.  Chest x-ray early 8/2 showed no infiltrates, no pneumothorax, right chest wall Port-A-Cath in good position.  Pertinent  Medical History   Past Medical History:  Diagnosis Date   BPH (benign prostatic hypertrophy)    CAD (coronary artery disease) 01/2010   S/P drug eluting stents LAD and PL branch of RCA Dr. 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   follicular lymphoma dx'd 06/2017   Hernia    HLD (hyperlipidemia)    HTN (hypertension)     Significant Hospital Events: Including procedures, antibiotic start and stop dates in addition to other pertinent events   Neck CT 7/29 >> large right neck and transfacial mass up to 12 cm long at the right mandible, adjacent level 1A lymph node that has progressed compared with prior imaging TTE 7/29 >> preserved LV function Blood cultures 8/2 > Group B strep (BCID) >  Urine culture 8/2  >  Echocardiogram 8/2 >>  Lower  extremity Dopplers 8/2 > no DVT on the right, age-indeterminate left posterior tibial and left peroneal DVT on the left  Interim History / Subjective:   Remains on norepinephrine  2 Troponin peak 735 K 3.2, Phos 1.7, Mg 1.6 Lactic acid 2.6 > 2.2 > 1.7  Objective    Blood pressure (!) 118/42, pulse (!) 58, temperature 97.7 F (36.5 C), temperature source Oral, resp. rate 15, height 5' 6 (1.676 m), weight 69.8 kg, SpO2 98%.        Intake/Output Summary (Last 24 hours) at 10/19/2023 0807 Last data filed at 10/19/2023 0600 Gross per 24 hour  Intake 3569.67 ml  Output 1400 ml  Net 2169.67 ml   Filed Weights   10/18/23 0158 10/19/23 0439  Weight: 70.3 kg 69.8 kg    Examination: General: Pleasant elderly man, comfortable, eating breakfast HENT: Large right facial, mandibular, upper neck mass.  Not tender.  Evidence for wall abnormality in the oropharynx without any ulceration or bleeding.  Tongue is midline.  Strong clear voice.  No evidence of difficulty with secretions.  Appears to be swallowing his breakfast without difficulty Lungs: Clear bilaterally Cardiovascular: Regular, distant, no murmur Abdomen: Nondistended with positive bowel sounds Extremities: Bilateral lower extremity edema 2-3+, more on the right.  Erythema on the anterior aspect of the right mid shin Neuro: Awake, alert, oriented.  Decreased hearing.  Decreased vision on the right.  Nonfocal Skin: Right Port-A-Cath site with some bruising, healing incision without any bleeding, purulence, pain.  Erythema on the anterior aspect of his  right shin  Resolved problem list   Assessment and Plan   Septic shock with group B strep bacteremia  Possible right lower extremity cellulitis - Lactic acid has cleared - Follow final culture results but BCID shows group B strep - Antibiotics adjusted to vancomycin  and cefazolin  evening of 8/2 based on culture results - Volume resuscitation - Wean norepinephrine  as able -  Echocardiogram pending to assess LV function and also for any evidence endocarditis  CAD w remote PTCI/stent Stress related non-STEMI due to sepsis Hyperlipidemia - Troponin has peaked - Appreciate cardiology assistance - Echocardiogram planned as above - Heparin  infusion started 8/2, plan to complete 48-72 hours - Add aspirin  - Continue Pravachol   Follicular / Non-Hodgkins lymphoma with evidence recent rapid progression - Core needle biopsies from 7/30 are still pending, follow - Port-A-Cath in place.  Site looks good - Was planning to initiate chemotherapy 8/4 > etoposide  and prednisone  are both on his medicine list.  It sounds like he has taken the prednisone  (versus continued his pre-existing prescription for dexamethasone ).  They do not have the etoposide  yet.  Will need to revisit timing of chemo, needs to stabilize from this acute infection  LE edema / erythema, Hx LE peroneal DVT off AC - No DVT on the right, evidence for left peroneal and posterior tibial of unclear chronicity DVT (does have a history).  If these are chronic DVT unclear that he should go back on anticoagulation.  He is covered with heparin  infusion at least for now  Electrolyte abnormalities, hypokalemia, hypomagnesemia, hypophosphatemia - Electrolyte replacement ordered 8/3 - Repeat BMP afternoon 8/3 and support appropriately  Hx HTN Mild aortic stenosis Chronic HFPEF - Continue to hold atenolol , furosemide   BPH - Continue finasteride   Anemia of chronic disease - Following intermittent CBC -Hemoglobin goal 7.0  Gout - On allopurinol   Glaucoma Chronic visual impairment right eye - Alphagan  ophthalmic solution   Best Practice (right click and Reselect all SmartList Selections daily)   Diet/type: Regular consistency (see orders) DVT prophylaxis prophylactic heparin   Pressure ulcer(s): N/A GI prophylaxis: PPI Lines: Central line > right port Foley:  N/A Code Status:  DNR, all medical care  appropriate Last date of multidisciplinary goals of care discussion [discussed 8/2.  Please refer also to IPAL note]  Labs   CBC: Recent Labs  Lab 10/13/23 1212 10/16/23 0700 10/18/23 0205 10/19/23 0242  WBC 5.0 4.9 9.5 12.6*  NEUTROABS 2.8  --  8.7*  --   HGB 11.0* 10.6* 11.7* 9.3*  HCT 34.2* 34.6* 36.5* 30.3*  MCV 85.7 88.3 87.3 89.6  PLT 269 260 235 199    Basic Metabolic Panel: Recent Labs  Lab 10/13/23 1212 10/16/23 0700 10/18/23 0205 10/19/23 0242  NA 143 139 141 139  K 3.6 4.3 3.3* 3.2*  CL 103 101 101 108  CO2 35* 27 26 23   GLUCOSE 85 157* 99 122*  BUN 22 22 35* 27*  CREATININE 1.00 0.72 0.77 0.85  CALCIUM 8.9 8.9 9.5 8.2*  MG  --  1.9  --  1.6*  PHOS  --   --   --  1.7*   GFR: Estimated Creatinine Clearance: 56.3 mL/min (by C-G formula based on SCr of 0.85 mg/dL). Recent Labs  Lab 10/13/23 1212 10/16/23 0700 10/18/23 0205 10/18/23 0210 10/18/23 0630 10/18/23 0748 10/18/23 0947 10/19/23 0242  PROCALCITON  --   --   --   --   --   --   --  2.63  WBC 5.0  4.9 9.5  --   --   --   --  12.6*  LATICACIDVEN  --   --   --    < > 13.4* 2.6* 2.2* 1.7   < > = values in this interval not displayed.    Liver Function Tests: Recent Labs  Lab 10/13/23 1212  AST 15  ALT 10  ALKPHOS 53  BILITOT 0.5  PROT 6.1*  ALBUMIN 3.6   No results for input(s): LIPASE, AMYLASE in the last 168 hours. No results for input(s): AMMONIA in the last 168 hours.  ABG No results found for: PHART, PCO2ART, PO2ART, HCO3, TCO2, ACIDBASEDEF, O2SAT   Coagulation Profile: Recent Labs  Lab 10/15/23 0653 10/18/23 1826  INR 1.1 1.2    Cardiac Enzymes: No results for input(s): CKTOTAL, CKMB, CKMBINDEX, TROPONINI in the last 168 hours.  HbA1C: No results found for: HGBA1C  CBG: No results for input(s): GLUCAP in the last 168 hours.   Critical care time: 31 min     Lamar Chris, MD, PhD 10/19/2023, 8:07 AM [facility] [phone] or if no answer before 7:00PM call [phone] For any issues after 7:00PM please call eLink [phone]

## 2023-10-19 NOTE — Progress Notes (Signed)
 [facility] ADULT ICU REPLACEMENT PROTOCOL   The patient does apply for the [facility] Adult ICU Electrolyte Replacment Protocol based on the criteria listed below:   1.Exclusion criteria: TCTS, ECMO, Dialysis, and Myasthenia Gravis patients 2. Is GFR >/= 30 ml/min? Yes.    Patient's GFR today is >60 3. Is SCr </= 2? Yes.   Patient's SCr is 0.85 mg/dL 4. Did SCr increase >/= 0.5 in 24 hours? No. 5.Pt's weight >40kg  Yes.   6. Abnormal electrolyte(s): Phos, K, Mag  7. Electrolytes replaced per protocol 8.  Call MD STAT for K+ </= 2.5, Phos </= 1, or Mag </= 1 Physician:  Haze Hunter BRAVO Kailan Carmen 10/19/2023 5:59 AM

## 2023-10-19 NOTE — Progress Notes (Signed)
 PHARMACY - ANTICOAGULATION CONSULT NOTE  Pharmacy Consult for Heparin  Indication: chest pain/ACS  No Known Allergies  Patient Measurements: Height: 5' 6 (167.6 cm) Weight: 69.8 kg (153 lb 14.1 oz) IBW/kg (Calculated) : 63.8 HEPARIN  DW (KG): 70.3  Vital Signs: Temp: 97.7 F (36.5 C) (08/03 1144) Temp Source: Oral (08/03 1144) BP: 105/48 (08/03 1300) Pulse Rate: 66 (08/03 1300)  Labs: Recent Labs    10/18/23 0205 10/18/23 0409 10/18/23 1543 10/18/23 1826 10/18/23 1929 10/18/23 2335 10/19/23 0242 10/19/23 1247  HGB 11.7*  --   --   --   --   --  9.3*  --   HCT 36.5*  --   --   --   --   --  30.3*  --   PLT 235  --   --   --   --   --  199  --   APTT  --   --   --  31  --   --   --   --   LABPROT  --   --   --  16.1*  --   --   --   --   INR  --   --   --  1.2  --   --   --   --   HEPARINUNFRC  --   --   --   --   --   --  <0.10* 0.17*  CREATININE 0.77  --   --   --   --   --  0.85  --   TROPONINIHS 18*   < > 735*  --  575* 539*  --   --    < > = values in this interval not displayed.    Estimated Creatinine Clearance: 56.3 mL/min (by C-G formula based on SCr of 0.85 mg/dL).   Medical History: Past Medical History:  Diagnosis Date   BPH (benign prostatic hypertrophy)    CAD (coronary artery disease) 01/2010   S/P drug eluting stents LAD and PL branch of RCA Dr. 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   follicular lymphoma dx'd 06/2017   Hernia    HLD (hyperlipidemia)    HTN (hypertension)         Assessment: Pharmacy consulted to manage IV heparin  for ACS/STEMI.  Evaluated today by cardiology for chest pain and increasingly elevated troponins.   10/19/23 Heparin  level 0.17 (subtherapeutic) with heparin  infusion at 1050 units/hr Hgb, plt decreased from yesterday Dopplers + age indeterminate LLE DVT No complications of therapy noted  Goal of Therapy:  Heparin  level 0.3-0.7 units/ml Monitor platelets by  anticoagulation protocol: Yes   Plan:  -Repeat heparin  2000 unit IV bolus -Increase heparin  gtt to 1250 units/hr  -Check heparin  level 8 hr after rate increase -Monitor daily heparin  level when titrated to goal, CBC, signs/symptoms of bleeding   Thank you for allowing pharmacy to be a part of this patient's care.  Stefano MARLA Bologna, PharmD, BCPS Clinical Pharmacist 10/19/2023 1:19 PM

## 2023-10-19 NOTE — Plan of Care (Signed)
  Problem: Education: Goal: Knowledge of General Education information will improve Description: Including pain rating scale, medication(s)/side effects and non-pharmacologic comfort measures Outcome: Progressing   Problem: Health Behavior/Discharge Planning: Goal: Ability to manage health-related needs will improve Outcome: Progressing   Problem: Clinical Measurements: Goal: Ability to maintain clinical measurements within normal limits will improve Outcome: Progressing Goal: Will remain free from infection Outcome: Progressing Goal: Diagnostic test results will improve Outcome: Progressing Goal: Respiratory complications will improve Outcome: Progressing Goal: Cardiovascular complication will be avoided Outcome: Progressing   Problem: Activity: Goal: Risk for activity intolerance will decrease Outcome: Progressing   Problem: Nutrition: Goal: Adequate nutrition will be maintained Outcome: Progressing   Problem: Coping: Goal: Level of anxiety will decrease Outcome: Progressing   Problem: Elimination: Goal: Will not experience complications related to bowel motility Outcome: Progressing Goal: Will not experience complications related to urinary retention Outcome: Progressing   Problem: Pain Managment: Goal: General experience of comfort will improve and/or be controlled Outcome: Progressing   Problem: Safety: Goal: Ability to remain free from injury will improve Outcome: Progressing   Problem: Skin Integrity: Goal: Risk for impaired skin integrity will decrease Outcome: Progressing   Problem: Fluid Volume: Goal: Hemodynamic stability will improve Outcome: Progressing   Problem: Clinical Measurements: Goal: Diagnostic test results will improve Outcome: Progressing Goal: Signs and symptoms of infection will decrease Outcome: Progressing   Problem: Respiratory: Goal: Ability to maintain adequate ventilation will improve Outcome: Progressing   Cindy S. Loreli  BSN, RN, [employer], CCRN 10/19/2023 2:32 AM

## 2023-10-19 NOTE — Progress Notes (Signed)
 PHARMACY - ANTICOAGULATION CONSULT NOTE  Pharmacy Consult for Heparin  Indication: chest pain/ACS  No Known Allergies  Patient Measurements: Height: 5' 6 (167.6 cm) Weight: 70.3 kg (155 lb) IBW/kg (Calculated) : 63.8 HEPARIN  DW (KG): 70.3  Vital Signs: Temp: 97.7 F (36.5 C) (08/03 0000) Temp Source: Oral (08/03 0000) BP: 129/42 (08/03 0315) Pulse Rate: 56 (08/03 0315)  Labs: Recent Labs    10/16/23 0700 10/18/23 0205 10/18/23 0409 10/18/23 1543 10/18/23 1826 10/18/23 1929 10/18/23 2335 10/19/23 0242  HGB 10.6* 11.7*  --   --   --   --   --  9.3*  HCT 34.6* 36.5*  --   --   --   --   --  30.3*  PLT 260 235  --   --   --   --   --  199  APTT  --   --   --   --  31  --   --   --   LABPROT  --   --   --   --  16.1*  --   --   --   INR  --   --   --   --  1.2  --   --   --   HEPARINUNFRC  --   --   --   --   --   --   --  <0.10*  CREATININE 0.72 0.77  --   --   --   --   --  0.85  TROPONINIHS  --  18*   < > 735*  --  575* 539*  --    < > = values in this interval not displayed.    Estimated Creatinine Clearance: 56.3 mL/min (by C-G formula based on SCr of 0.85 mg/dL).   Medical History: Past Medical History:  Diagnosis Date   BPH (benign prostatic hypertrophy)    CAD (coronary artery disease) 01/2010   S/P drug eluting stents LAD and PL branch of [facility] Dr. 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 On heparin  5000 units subcutaneous q8h, last dose 8/2 1349  10/19/23 Heparin  level < 0.1 (subtherapeutic) with heparin  gtt @ 850 units/hr Hg = 9.3, Pltc 199k  Dopplers = + age indeterminate LLE DVT RN verified no interruption in heparin  infusion; no line issues.  No bleeding complications reported.  Assessment: Pharmacy consulted to manage IV  heparin  for ACS/STEMI.  Evaluated today by cardiology for chest pain and increasingly elevated troponins.    Goal of Therapy:  Heparin  level 0.3-0.7 units/ml Monitor platelets by anticoagulation protocol: Yes   Plan:  Heparin  2000 unit IV bolus x 1 Increase heparin  gtt to 1050 units/hr  Check heparin  level 8 hr after rate increase Monitor daily heparin  level when titrated to goal, CBC, signs/symptoms of bleeding   Thank you for allowing pharmacy to be a part of this patient's care.  Arvin Gauss, PharmD Clinical Pharmacist [place] 10/19/2023 3:30 AM

## 2023-10-19 NOTE — Progress Notes (Incomplete)
 HEMATOLOGY/ONCOLOGY CLINIC NOTE  Date of Service: 10/20/2023  Patient Care Team: [name], MD as PCP - General (Family Medicine) Verlin Lonni BIRCH, MD as PCP - Cardiology (Cardiology)  CHIEF COMPLAINTS/PURPOSE OF CONSULTATION:  Follow-up for continued evaluation and management of low-grade follicular lymphoma   HISTORY OF PRESENTING ILLNESS:  please see previous notes for details on initial presentation.  INTERVAL HISTORY:   Harold [name] is a 89 y.o. male here for continued evaluation and management of his low-grade follicular lymphoma.   Patient was last seen by me in clinic on 10/13/2023 and was noted to have rapid increased size of rt upper neck/jaw mass causing discomfort as well as difficulty with eating and speaking. 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   Patient was admitted on 10/13/2023 for expedited workup and treatment for his rapidly enlarging right upper neck mass. Core needle biopsies performed 7/30 in IR and his port was placed on 7/30. Patient was started on Prednisone  and was was discharged on 10/16/2023.   Patient was re-admitted on 10/18/2023 with chest discomfort and fever with concerns of septic shock.   He is scheduled to start R-CEOP today.      MEDICAL HISTORY:  Past Medical History:  Diagnosis Date   BPH (benign prostatic hypertrophy)    CAD (coronary artery disease) 01/2010   S/P drug eluting stents LAD and PL branch of [facility] Dr. 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   follicular lymphoma dx'd 06/2017   Hernia    HLD (hyperlipidemia)    HTN (hypertension)     SURGICAL HISTORY: Past Surgical History:  Procedure Laterality Date   BACK SURGERY     CORONARY ANGIOGRAPHY N/A 01/29/2023   Procedure: CORONARY ANGIOGRAPHY;  Surgeon: Verlin Lonni BIRCH, MD;  Location: [facility];  Service: Cardiovascular;  Laterality: N/A;    CORONARY BALLOON ANGIOPLASTY N/A 01/29/2023   Procedure: CORONARY BALLOON ANGIOPLASTY;  Surgeon: Verlin Lonni BIRCH, MD;  Location: [facility];  Service: Cardiovascular;  Laterality: N/A;   CORONARY LITHOTRIPSY N/A 01/29/2023   Procedure: CORONARY LITHOTRIPSY;  Surgeon: Verlin Lonni BIRCH, MD;  Location: [facility];  Service: Cardiovascular;  Laterality: N/A;   CYSTOSCOPY WITH LITHOLAPAXY N/A 05/20/2019   Procedure: CYSTOSCOPY WITH LITHOLAPAXY;  Surgeon: Cam Morene ORN, MD;  Location: [facility];  Service: Urology;  Laterality: N/A;   CYSTOSCOPY/URETEROSCOPY/HOLMIUM LASER/STENT PLACEMENT Left 05/20/2019   Procedure: CYSTOSCOPY/URETEROSCOPY/HOLMIUM LASER/STENT PLACEMENT;  Surgeon: Cam Morene ORN, MD;  Location: [facility];  Service: Urology;  Laterality: Left;   HERNIA REPAIR  01/08/11   RIH   IR IMAGING GUIDED PORT INSERTION  10/15/2023   IR US  GUIDE BX ASP/DRAIN  10/15/2023   MASS EXCISION Right 08/05/2017   Procedure: EXCISION RIGHT POSTERIOR  NECK LYMPH NODE;  Surgeon: Ethyl Lonni BRAVO, MD;  Location: [facility];  Service: ENT;  Laterality: Right;    SOCIAL HISTORY: Social History   Socioeconomic History   Marital status: Married    Spouse name: Not on file   Number of children: Not on file   Years of education: Not on file   Highest education level: Not on file  Occupational History   Not on file  Tobacco Use   Smoking status: Never   Smokeless tobacco: Never  Vaping Use   Vaping status: Never Used  Substance and Sexual Activity   Alcohol use: No  Drug use: No   Sexual activity: Not on file  Other Topics Concern   Not on file  Social History Narrative   Married   Social Drivers of Health   Financial Resource Strain: Low Risk  (11/17/2022)   Received [facility]   Overall Financial Resource Strain (CARDIA)    Difficulty of Paying Living Expenses: Not very hard  Food Insecurity: No Food Insecurity (10/18/2023)   Hunger  Vital Sign    Worried About Running Out of Food in the Last Year: Never true    Ran Out of Food in the Last Year: Never true  Transportation Needs: No Transportation Needs (10/18/2023)   PRAPARE - Administrator, Civil Service (Medical): No    Lack of Transportation (Non-Medical): No  Physical Activity: Inactive (11/17/2022)   Received from [facility]   Exercise Vital Sign    On average, how many days per week do you engage in moderate to strenuous exercise (like a brisk walk)?: 0 days    On average, how many minutes do you engage in exercise at this level?: 0 min  Stress: Stress Concern Present (11/17/2022)   Received from [facility] - Occupational Stress Questionnaire    Feeling of Stress : Rather much  Social Connections: Moderately Integrated (10/18/2023)   Social Connection and Isolation Panel    Frequency of Communication with Friends and Family: Three times a week    Frequency of Social Gatherings with Friends and Family: Once a week    Attends Religious Services: 1 to 4 times per year    Active Member of [employer] or Organizations: Not on file    Attends Banker Meetings: 1 to 4 times per year    Marital Status: Widowed  Intimate Partner Violence: Not At Risk (10/18/2023)   Humiliation, Afraid, Rape, and Kick questionnaire    Fear of Current or Ex-Partner: No    Emotionally Abused: No    Physically Abused: No    Sexually Abused: No    FAMILY HISTORY: Family History  Problem Relation Age of Onset   Heart failure Mother     ALLERGIES:  has no known allergies.  MEDICATIONS:  No current facility-administered medications for this visit.   No current outpatient medications on file.   Facility-Administered Medications Ordered in Other Visits  Medication Dose Route Frequency Provider Last Rate Last Admin   acyclovir  (ZOVIRAX ) tablet 400 mg  400 mg Oral Daily Byrum, Robert S, MD   400 mg at 10/19/23 9056    allopurinol  (ZYLOPRIM ) tablet 100 mg  100 mg Oral BID Byrum, Robert S, MD   100 mg at 10/19/23 9056   aspirin  EC tablet 81 mg  81 mg Oral Daily Shlomo Wilbert SAUNDERS, MD   81 mg at 10/19/23 9057   brimonidine  (ALPHAGAN ) 0.2 % ophthalmic solution 1 drop  1 drop Right Eye BID Byrum, Robert S, MD   1 drop at 10/19/23 0947   ceFAZolin  (ANCEF ) IVPB 2g/100 mL premix  2 g Intravenous Q8H Haze Led, MD   Stopped at 10/19/23 9362   Chlorhexidine  Gluconate Cloth 2 % PADS 6 each  6 each Topical Daily Byrum, Robert S, MD   6 each at 10/18/23 0920   cholecalciferol  (VITAMIN D3) 25 MCG (1000 UNIT) tablet 1,000 Units  1,000 Units Oral Daily Byrum, Robert S, MD   1,000 Units at 10/19/23 0943   cyanocobalamin  (VITAMIN B12) tablet 2,500 mcg  2,500 mcg Oral Daily Byrum, Robert S, MD   2,500 mcg at 10/19/23 9055   docusate sodium  (COLACE) capsule 100 mg  100 mg Oral BID PRN Byrum, Robert S, MD       finasteride  (PROSCAR ) tablet 5 mg  5 mg Oral Daily Byrum, Robert S, MD   5 mg at 10/19/23 9056   gabapentin  (NEURONTIN ) capsule 100 mg  100 mg Oral BID PRN Shelah Lamar RAMAN, MD       heparin  ADULT infusion 100 units/mL (25000 units/250mL)  1,250 Units/hr Intravenous Continuous Bethene Stefano POUR, RPH 10.5 mL/hr at 10/19/23 1313 1,050 Units/hr at 10/19/23 1313   heparin  bolus via infusion 2,000 Units  2,000 Units Intravenous Once Ellington, Abby K, RPH       norepinephrine  (LEVOPHED ) 4mg  in (0.016 mg/mL) premix infusion  0-10 mcg/min Intravenous Titrated Byrum, Robert S, MD   Stopped at 10/19/23 [phone]   ondansetron  (ZOFRAN ) tablet 8 mg  8 mg Oral Q8H PRN Shelah Lamar RAMAN, MD       Oral care mouth rinse  15 mL Mouth Rinse PRN Byrum, Robert S, MD       pantoprazole  (PROTONIX ) EC tablet 40 mg  40 mg Oral Daily Byrum, Robert S, MD   40 mg at 10/19/23 9056   polyethylene glycol (MIRALAX  / GLYCOLAX ) packet 17 g  17 g Oral Daily PRN Shelah Lamar RAMAN, MD       potassium PHOSPHATE  30 mmol in dextrose  5 % 500 mL infusion  30 mmol  Intravenous Once Byrum, Robert S, MD 85 mL/hr at 10/19/23 1313 Infusion Verify at 10/19/23 1313   pravastatin  (PRAVACHOL ) tablet 40 mg  40 mg Oral QHS Byrum, Robert S, MD   40 mg at 10/18/23 2246   vancomycin  (VANCOREADY) IVPB 1250 mg/250 mL  1,250 mg Intravenous Q24H Poindexter, Leann T, RPH        REVIEW OF SYSTEMS:    10 Point review of Systems was done is negative except as noted above.   PHYSICAL EXAMINATION: ECOG FS:2 - Symptomatic, <50% confined to bed  There were no vitals filed for this visit.  Wt Readings from Last 3 Encounters:  10/19/23 153 lb 14.1 oz (69.8 kg)  10/13/23 155 lb 5 oz (70.5 kg)  10/13/23 155 lb 3.2 oz (70.4 kg)   There is no height or weight on file to calculate BMI.    GENERAL:alert, in no acute distress and comfortable SKIN: no acute rashes, no significant lesions EYES: conjunctiva are pink and non-injected, sclera anicteric OROPHARYNX: MMM, no exudates, no oropharyngeal erythema or ulceration NECK: supple, no JVD LYMPH:  no palpable lymphadenopathy in the cervical, axillary or inguinal regions LUNGS: clear to auscultation b/l with normal respiratory effort HEART: regular rate & rhythm ABDOMEN:  normoactive bowel sounds , non tender, not distended. Extremity: no pedal edema PSYCH: alert & oriented x 3 with fluent speech NEURO: no focal motor/sensory deficits   LABORATORY DATA:  I have reviewed the data as listed     Latest Ref Rng & Units 10/19/2023    2:42 AM 10/18/2023    2:05 AM 10/16/2023    7:00 AM  CBC  WBC 4.0 - 10.5 K/uL 12.6  9.5  4.9   Hemoglobin 13.0 - 17.0 g/dL 9.3  88.2  89.3   Hematocrit 39.0 - 52.0 % 30.3  36.5  34.6   Platelets 150 - 400 K/uL 199  235  260        Latest Ref Rng &  Units 10/19/2023    2:42 AM 10/18/2023    2:05 AM 10/16/2023    7:00 AM  CMP  Glucose 70 - 99 mg/dL 877  99  842   BUN 8 - 23 mg/dL 27  35  22   Creatinine 0.61 - 1.24 mg/dL 9.14  9.22  9.27   Sodium 135 - 145 mmol/L 139  141  139   Potassium 3.5 -  5.1 mmol/L 3.2  3.3  4.3   Chloride 98 - 111 mmol/L 108  101  101   CO2 22 - 32 mmol/L 23  26  27    Calcium 8.9 - 10.3 mg/dL 8.2  9.5  8.9    Lab Results  Component Value Date   LDH 193 (H) 09/29/2023    08/27/2023 Surgical Pathology:      08/27/2023 Surgical Pathology:        02/18/2019 CT Abdomen Pelvis W Contrast (Accession [id number]):   02/18/2019 CT Chest W Contrast (Accession [id number]):  08/08/17 Pathology:   08/05/17 Pathology:     RADIOGRAPHIC STUDIES: I have personally reviewed the radiological images as listed and agreed with the findings in the report. ECHOCARDIOGRAM COMPLETE Result Date: 10/19/2023    ECHOCARDIOGRAM REPORT   Patient Name:   Harold [name] Date of Exam: 10/19/2023 Medical Rec #:  [id number]      Height:       66.0 in Accession #:    [id number]     Weight:       153.9 lb Date of Birth:  14-Oct-1934      BSA:          1.789 m Patient Age:    88 years       BP:           111/48 mmHg Patient Gender: M              HR:           69 bpm. Exam Location:  Inpatient Procedure: 2D Echo, Cardiac Doppler, Color Doppler, Strain Analysis and 3D Echo            (Both Spectral and Color Flow Doppler were utilized during            procedure). Indications:    Shock  History:        Patient has prior history of Echocardiogram examinations, most                 recent 10/15/2023. CAD and Previous Myocardial Infarction, Mitral                 Valve Disease and Aortic Valve Disease; Risk                 Factors:Hypertension and Dyslipidemia.  Sonographer:    Therisa Crouch Referring Phys: 1 ROBERT S BYRUM IMPRESSIONS  1. No new RWMA;s since TTE done 10/15/23. Left ventricular ejection fraction, by estimation, is 60 to 65%. The left ventricle has normal function. The left ventricle has no regional wall motion abnormalities. Left ventricular diastolic parameters are consistent with Grade II diastolic dysfunction (pseudonormalization). Elevated left ventricular end-diastolic  pressure. The average left ventricular global longitudinal strain is -19.9 %. The global longitudinal strain is normal.  2. Right ventricular systolic function is normal. The right ventricular size is normal.  3. Right atrial size was mildly dilated.  4. The mitral valve is degenerative. Trivial mitral valve regurgitation. Mild mitral stenosis. Moderate mitral annular calcification.  5. Tricuspid valve  regurgitation is moderate.  6. The aortic valve is tricuspid. There is moderate calcification of the aortic valve. There is moderate thickening of the aortic valve. Aortic valve regurgitation is not visualized. Mild to moderate aortic valve stenosis.  7. The inferior vena cava is normal in size with greater than 50% respiratory variability, suggesting right atrial pressure of 3 mmHg. FINDINGS  Left Ventricle: No new RWMA;s since TTE done 10/15/23. Left ventricular ejection fraction, by estimation, is 60 to 65%. The left ventricle has normal function. The left ventricle has no regional wall motion abnormalities. The average left ventricular global longitudinal strain is -19.9 %. Strain was performed and the global longitudinal strain is normal. The left ventricular internal cavity size was normal in size. There is no left ventricular hypertrophy. Left ventricular diastolic parameters are consistent with Grade II diastolic dysfunction (pseudonormalization). Elevated left ventricular end-diastolic pressure. Right Ventricle: The right ventricular size is normal. No increase in right ventricular wall thickness. Right ventricular systolic function is normal. Left Atrium: Left atrial size was normal in size. Right Atrium: Right atrial size was mildly dilated. Pericardium: There is no evidence of pericardial effusion. Mitral Valve: The mitral valve is degenerative in appearance. Moderate mitral annular calcification. Trivial mitral valve regurgitation. Mild mitral valve stenosis. MV peak gradient, 8.9 mmHg. The mean mitral  valve gradient is 5.0 mmHg. Tricuspid Valve: The tricuspid valve is normal in structure. Tricuspid valve regurgitation is moderate . No evidence of tricuspid stenosis. Aortic Valve: The aortic valve is tricuspid. There is moderate calcification of the aortic valve. There is moderate thickening of the aortic valve. Aortic valve regurgitation is not visualized. Mild to moderate aortic stenosis is present. Aortic valve mean gradient measures 11.7 mmHg. Aortic valve peak gradient measures 20.4 mmHg. Aortic valve area, by VTI measures 1.32 cm. Pulmonic Valve: The pulmonic valve was normal in structure. Pulmonic valve regurgitation is trivial. No evidence of pulmonic stenosis. Aorta: The aortic root is normal in size and structure. Venous: The inferior vena cava is normal in size with greater than 50% respiratory variability, suggesting right atrial pressure of 3 mmHg. IAS/Shunts: No atrial level shunt detected by color flow Doppler. Additional Comments: 3D was performed not requiring image post processing on an independent workstation and was indeterminate.  LEFT VENTRICLE PLAX 2D LVIDd:         3.80 cm   Diastology LVIDs:         2.50 cm   LV e' medial:    6.64 cm/s LV PW:         0.90 cm   LV E/e' medial:  17.3 LV IVS:        0.90 cm   LV e' lateral:   7.83 cm/s LVOT diam:     2.00 cm   LV E/e' lateral: 14.7 LV SV:         66 LV SV Index:   37        2D Longitudinal Strain LVOT Area:     3.14 cm  2D Strain GLS Avg:     -19.9 %                           3D Volume EF                          LV 3D EDV:   103.42 ml  LV 3D ESV:   39.24 ml RIGHT VENTRICLE             IVC RV Basal diam:  3.60 cm     IVC diam: 3.00 cm RV S prime:     13.30 cm/s TAPSE (M-mode): 2.4 cm LEFT ATRIUM             Index        RIGHT ATRIUM           Index LA diam:        3.60 cm 2.01 cm/m   RA Area:     20.60 cm LA Vol (A2C):   77.6 ml 43.38 ml/m  RA Volume:   55.40 ml  30.97 ml/m LA Vol (A4C):   75.6 ml 42.26 ml/m LA  Biplane Vol: 77.3 ml 43.21 ml/m  AORTIC VALVE AV Area (Vmax):    1.27 cm AV Area (Vmean):   1.23 cm AV Area (VTI):     1.32 cm AV Vmax:           226.00 cm/s AV Vmean:          158.333 cm/s AV VTI:            0.505 m AV Peak Grad:      20.4 mmHg AV Mean Grad:      11.7 mmHg LVOT Vmax:         91.05 cm/s LVOT Vmean:        62.100 cm/s LVOT VTI:          0.212 m LVOT/AV VTI ratio: 0.42  AORTA Ao Root diam: 3.50 cm Ao Asc diam:  3.20 cm MITRAL VALVE                TRICUSPID VALVE MV Area (PHT): 3.17 cm     TR Peak grad:   34.8 mmHg MV Area VTI:   1.32 cm     TR Vmax:        295.00 cm/s MV Peak grad:  8.9 mmHg MV Mean grad:  5.0 mmHg     SHUNTS MV Vmax:       1.49 m/s     Systemic VTI:  0.21 m MV Vmean:      106.5 cm/s   Systemic Diam: 2.00 cm MV Decel Time: 239 msec MV E velocity: 115.00 cm/s MV A velocity: 130.00 cm/s MV E/A ratio:  0.88 Maude Emmer MD Electronically signed by Maude Emmer MD Signature Date/Time: 10/19/2023/10:56:55 AM    Final    VAS US  LOWER EXTREMITY VENOUS (DVT) Result Date: 10/18/2023  Lower Venous DVT Study Patient Name:  EROS MONTOUR  Date of Exam:   10/18/2023 Medical Rec #: [id number]       Accession #:    [id number] Date of Birth: 1935-03-01       Patient Gender: M Patient Age:   30 years Exam Location:  [facility] Procedure:      VAS US  LOWER EXTREMITY VENOUS (DVT) Referring Phys: LAMAR CHRIS --------------------------------------------------------------------------------  Indications: Right > L Edema.  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 Chronic HFpEF. Limitations: Pitting edema, involuntary patient movement. Comparison Study: Prior bilateral LEV done 05/05/20 indicating chronic left                   peroneal DVT Performing Technologist: Alberta Lis RVS  Examination Guidelines: A complete evaluation includes B-mode imaging, spectral Doppler, color Doppler, and power Doppler as  needed  of all accessible portions of each vessel. Bilateral testing is considered an integral part of a complete examination. Limited examinations for reoccurring indications may be performed as noted. The reflux portion of the exam is performed with the patient in reverse Trendelenburg.  +---------+---------------+---------+-----------+----------+--------------+ RIGHT    CompressibilityPhasicitySpontaneityPropertiesThrombus Aging +---------+---------------+---------+-----------+----------+--------------+ CFV      Full           Yes      No                                  +---------+---------------+---------+-----------+----------+--------------+ SFJ      Full                                                        +---------+---------------+---------+-----------+----------+--------------+ FV Prox  Full           Yes      No                                  +---------+---------------+---------+-----------+----------+--------------+ FV Mid   Full                                                        +---------+---------------+---------+-----------+----------+--------------+ FV DistalFull                                                        +---------+---------------+---------+-----------+----------+--------------+ PFV      Full           Yes      No                                  +---------+---------------+---------+-----------+----------+--------------+ POP      Full           Yes      No                                  +---------+---------------+---------+-----------+----------+--------------+ PTV      Full                                                        +---------+---------------+---------+-----------+----------+--------------+ PERO     Full                                                        +---------+---------------+---------+-----------+----------+--------------+    +---------+---------------+---------+-----------+----------+-------------------+ LEFT     CompressibilityPhasicitySpontaneityPropertiesThrombus Aging      +---------+---------------+---------+-----------+----------+-------------------+  CFV      Full           Yes      No                                       +---------+---------------+---------+-----------+----------+-------------------+ SFJ      Full                                                             +---------+---------------+---------+-----------+----------+-------------------+ FV Prox  Full           Yes      No                                       +---------+---------------+---------+-----------+----------+-------------------+ FV Mid   Full                                                             +---------+---------------+---------+-----------+----------+-------------------+ FV DistalFull           Yes      No                                       +---------+---------------+---------+-----------+----------+-------------------+ PFV      Full                                                             +---------+---------------+---------+-----------+----------+-------------------+ POP                     Yes      No                   patent by color and                                                       Doppler             +---------+---------------+---------+-----------+----------+-------------------+ PTV      None                                         Age Indeterminate   +---------+---------------+---------+-----------+----------+-------------------+ PERO     None                                         Age  Indeterminate   +---------+---------------+---------+-----------+----------+-------------------+    Summary: RIGHT: - There is no evidence of deep vein thrombosis in the lower extremity.  Interstitial edema noted throughout the extremity  LEFT: - Findings  consistent with age indeterminate deep vein thrombosis involving the left posterior tibial veins, and left peroneal veins.  Interstitial edema noted throughout the extremity.  *See table(s) above for measurements and observations.    Preliminary    DG Chest Port 1 View Result Date: 10/18/2023 EXAM: 1 VIEW XRAY OF THE CHEST 10/18/2023 02:13:00 AM COMPARISON: 01/01/2011 CLINICAL HISTORY: 355200 Chest pain [id number]. Per chart: Patient BIB EMS for chest pain and fever that started last night. Patient was seen here earlier this week and had port implanted for chemotherapy related to Non-Hodgkin's lymphoma. Patient given 324 ASA and 1 inch nitropaste from EMS. Denies any pain at this time. FINDINGS: LUNGS AND PLEURA: No focal pulmonary opacity. No pulmonary edema. No pleural effusion. No pneumothorax. HEART AND MEDIASTINUM: No acute abnormality of the cardiac and mediastinal silhouettes. BONES AND SOFT TISSUES: No acute osseous abnormality. Right chest wall port-a-cath with tip in the lower SVC. IMPRESSION: 1. No acute process. Electronically signed by: Franky Stanford MD 10/18/2023 02:24 AM EDT RP Workstation: [id number]   ECHOCARDIOGRAM COMPLETE Result Date: 10/15/2023    ECHOCARDIOGRAM REPORT   Patient Name:   Harold [name] Date of Exam: 10/15/2023 Medical Rec #:  [id number]      Height:       66.0 in Accession #:    [id number]     Weight:       155.3 lb Date of Birth:  02-Sep-1934      BSA:          1.796 m Patient Age:    88 years       BP:           95/50 mmHg Patient Gender: M              HR:           68 bpm. Exam Location:  Inpatient Procedure: 2D Echo, 3D Echo, Strain Analysis, Cardiac Doppler and Color Doppler            (Both Spectral and Color Flow Doppler were utilized during            procedure). Indications:    CAD native vessel I25.10  History:        Patient has prior history of Echocardiogram examinations, most                 recent 01/29/2023. CAD and Previous Myocardial Infarction,                  Mitral Valve Disease; Risk Factors:Hypertension and                 Dyslipidemia.  Sonographer:    Koleen Popper RDCS Referring Phys: [phone] ANAND D HONGALGI  Sonographer Comments: Global longitudinal strain was attempted. IMPRESSIONS  1. Left ventricular ejection fraction, by estimation, is 60 to 65%. Left ventricular ejection fraction by 3D volume is 62 %. The left ventricle has normal function. The left ventricle has no regional wall motion abnormalities. Left ventricular diastolic  parameters are consistent with Grade II diastolic dysfunction (pseudonormalization). The average left ventricular global longitudinal strain is -19.9 %. The global longitudinal strain is normal.  2. Right ventricular systolic function is mildly reduced. The right ventricular size is mildly enlarged. There is mildly elevated pulmonary artery systolic pressure. The  estimated right ventricular systolic pressure is 41.6 mmHg.  3. The mitral valve is normal in structure. Mild mitral valve regurgitation. Mild mitral stenosis. Severe mitral annular calcification.  4. The tricuspid valve is degenerative. Tricuspid valve regurgitation is moderate.  5. The aortic valve has an indeterminant number of cusps. There is severe calcifcation of the aortic valve. Aortic valve regurgitation is mild. Mild aortic valve stenosis. Aortic valve Vmax measures 2.17 m/s.  6. The inferior vena cava is dilated in size with <50% respiratory variability, suggesting right atrial pressure of 15 mmHg. FINDINGS  Left Ventricle: Left ventricular ejection fraction, by estimation, is 60 to 65%. Left ventricular ejection fraction by 3D volume is 62 %. The left ventricle has normal function. The left ventricle has no regional wall motion abnormalities. The average left ventricular global longitudinal strain is -19.9 %. Strain was performed and the global longitudinal strain is normal. The left ventricular internal cavity size was normal in size. There is no left ventricular  hypertrophy. Left ventricular diastolic parameters are consistent with Grade II diastolic dysfunction (pseudonormalization). Right Ventricle: The right ventricular size is mildly enlarged. No increase in right ventricular wall thickness. Right ventricular systolic function is mildly reduced. There is mildly elevated pulmonary artery systolic pressure. The tricuspid regurgitant  velocity is 2.58 m/s, and with an assumed right atrial pressure of 15 mmHg, the estimated right ventricular systolic pressure is 41.6 mmHg. Left Atrium: Left atrial size was normal in size. Right Atrium: Right atrial size was normal in size. Pericardium: There is no evidence of pericardial effusion. Mitral Valve: The mitral valve is normal in structure. Severe mitral annular calcification. Mild mitral valve regurgitation. Mild mitral valve stenosis. MV peak gradient, 12.7 mmHg. The mean mitral valve gradient is 4.0 mmHg. Tricuspid Valve: The tricuspid valve is degenerative in appearance. Tricuspid valve regurgitation is moderate . No evidence of tricuspid stenosis. Aortic Valve: The aortic valve has an indeterminant number of cusps. There is severe calcifcation of the aortic valve. Aortic valve regurgitation is mild. Mild aortic stenosis is present. Aortic valve mean gradient measures 11.0 mmHg. Aortic valve peak gradient measures 18.8 mmHg. Aortic valve area, by VTI measures 1.07 cm. Pulmonic Valve: The pulmonic valve was grossly normal. Pulmonic valve regurgitation is mild. No evidence of pulmonic stenosis. Aorta: The aortic root is normal in size and structure. Venous: The inferior vena cava is dilated in size with less than 50% respiratory variability, suggesting right atrial pressure of 15 mmHg. IAS/Shunts: No atrial level shunt detected by color flow Doppler. Additional Comments: 3D was performed not requiring image post processing on an independent workstation and was normal.  LEFT VENTRICLE PLAX 2D LVIDd:         3.60 cm          Diastology LVIDs:         2.30 cm         LV e' medial:    6.20 cm/s LV PW:         1.00 cm         LV E/e' medial:  18.9 LV IVS:        1.00 cm         LV e' lateral:   5.77 cm/s LVOT diam:     1.50 cm         LV E/e' lateral: 20.3 LV SV:         46 LV SV Index:   26  2D Longitudinal LVOT Area:     1.77 cm        Strain                                2D Strain GLS   -19.9 %                                Avg:                                 3D Volume EF                                LV 3D EF:    Left                                             ventricul                                             ar                                             ejection                                             fraction                                             by 3D                                             volume is                                             62 %.                                 3D Volume EF:                                3D EF:        62 %                                LV EDV:       93 ml  LV ESV:       35 ml                                LV SV:        58 ml RIGHT VENTRICLE             IVC RV Basal diam:  4.30 cm     IVC diam: 2.40 cm RV Mid diam:    3.80 cm RV S prime:     14.90 cm/s TAPSE (M-mode): 2.0 cm LEFT ATRIUM             Index        RIGHT ATRIUM           Index LA diam:        3.30 cm 1.84 cm/m   RA Area:     12.80 cm LA Vol (A2C):   37.9 ml 21.10 ml/m  RA Volume:   29.90 ml  16.65 ml/m LA Vol (A4C):   35.3 ml 19.65 ml/m LA Biplane Vol: 39.5 ml 21.99 ml/m  AORTIC VALVE AV Area (Vmax):    1.07 cm AV Area (Vmean):   0.94 cm AV Area (VTI):     1.07 cm AV Vmax:           217.00 cm/s AV Vmean:          157.000 cm/s AV VTI:            0.432 m AV Peak Grad:      18.8 mmHg AV Mean Grad:      11.0 mmHg LVOT Vmax:         131.00 cm/s LVOT Vmean:        83.200 cm/s LVOT VTI:          0.261 m LVOT/AV VTI ratio: 0.60  AORTA Ao Root diam: 2.70 cm Ao Asc  diam:  3.30 cm MITRAL VALVE                TRICUSPID VALVE MV Area (PHT): 2.16 cm     TR Peak grad:   26.6 mmHg MV Area VTI:   1.02 cm     TR Vmax:        258.00 cm/s MV Peak grad:  12.7 mmHg MV Mean grad:  4.0 mmHg     SHUNTS MV Vmax:       1.78 m/s     Systemic VTI:  0.26 m MV Vmean:      95.9 cm/s    Systemic Diam: 1.50 cm MV Decel Time: 351 msec MV E velocity: 117.00 cm/s MV A velocity: 149.00 cm/s MV E/A ratio:  0.79 Morene Brownie Electronically signed by Morene Brownie Signature Date/Time: 10/15/2023/6:24:54 PM    Final    IR IMAGING GUIDED PORT INSERTION Result Date: 10/15/2023 INDICATION: 89 year old male with a known history of low-grade follicular lymphoma with enlarging face all mass concerning for transformation to high-grade lymphoma. He presents for port catheter placement to establish durable venous access in preparation for chemotherapy as well as ultrasound-guided core biopsy of the facial mass. EXAM: IMPLANTED PORT A CATH PLACEMENT WITH ULTRASOUND AND FLUOROSCOPIC GUIDANCE ULTRASOUND-GUIDED CORE BIOPSY MEDICATIONS: None. ANESTHESIA/SEDATION: Versed  2 mg IV; Fentanyl  100 mcg IV; administered by the radiology nurse Moderate Sedation Time:  42 minutes The patient's vital signs and level of consciousness were continuously monitored during the procedure by the interventional radiology nurse under my direct supervision. FLUOROSCOPY:  Radiation exposure index: 3 mGy reference air kerma COMPLICATIONS: None immediate. PROCEDURE: The right neck and chest was prepped with chlorhexidine , and draped in the usual sterile fashion using maximum barrier technique (cap and mask, sterile gown, sterile gloves, large sterile sheet, hand hygiene and cutaneous antiseptic). Local anesthesia was attained by infiltration with 1% lidocaine  with epinephrine . Ultrasound demonstrated patency of the right internal jugular vein, and this was documented with an image. Under real-time ultrasound guidance, this vein was  accessed with a 21 gauge micropuncture needle and image documentation was performed. A small dermatotomy was made at the access site with an 11 scalpel. A 0.018 wire was advanced into the SVC and the access needle exchanged for a 46F micropuncture vascular sheath. The 0.018 wire was then removed and a 0.035 wire advanced into the IVC. An appropriate location for the subcutaneous reservoir was selected below the clavicle and an incision was made through the skin and underlying soft tissues. The subcutaneous tissues were then dissected using a combination of blunt and sharp surgical technique and a pocket was formed. A Bard Clear Vue single lumen power injectable portacatheter was then tunneled through the subcutaneous tissues from the pocket to the dermatotomy and the port reservoir placed within the subcutaneous pocket. The venous access site was then serially dilated and a peel away vascular sheath placed over the wire. The wire was removed and the port catheter advanced into position under fluoroscopic guidance. The catheter tip is positioned in the superior cavoatrial junction. This was documented with a spot image. The portacatheter was then tested and found to flush and aspirate well. The port was flushed with saline followed by 100 units/mL heparinized saline. The pocket was then closed in two layers using first subdermal inverted interrupted absorbable sutures followed by a running subcuticular suture. The epidermis was then sealed with Dermabond. The dermatotomy at the venous access site was also closed with Dermabond. The port catheter was left accessed and sterile bandages were applied. Next, the facial mass was evaluated. The mass was interrogated with ultrasound. A suitable biopsy entry site was identified in the submental space allowing passage through relatively normal soft tissues in a region of relatively low vascularity within the mass. The skin was sterilely prepped and draped in the usual  fashion using chlorhexidine  skin prep. Local anesthesia was attained by infiltration with 1% lidocaine . A small dermatotomy was made. Under real-time ultrasound guidance, multiple 18 gauge core biopsies were obtained using the Argon automated biopsy device. Biopsy specimens were placed on a moistened Telfa pad and submitted to pathology for further analysis. Post biopsy imaging demonstrates no evidence of active bleeding or other complication. IMPRESSION: Successful placement of a right IJ approach Bard Clear Vue port catheter with ultrasound and fluoroscopic guidance. The catheter is ready for use. Successful ultrasound-guided biopsy of right facial mass. Electronically Signed   By: Wilkie Lent M.D.   On: 10/15/2023 11:35   IR US  Guide Bx Asp/Drain Result Date: 10/15/2023 INDICATION: 89 year old male with a known history of low-grade follicular lymphoma with enlarging face all mass concerning for transformation to high-grade lymphoma. He presents for port catheter placement to establish durable venous access in preparation for chemotherapy as well as ultrasound-guided core biopsy of the facial mass. EXAM: IMPLANTED PORT A CATH PLACEMENT WITH ULTRASOUND AND FLUOROSCOPIC GUIDANCE ULTRASOUND-GUIDED CORE BIOPSY MEDICATIONS: None. ANESTHESIA/SEDATION: Versed  2 mg IV; Fentanyl  100 mcg IV; administered by the radiology nurse Moderate Sedation Time:  42 minutes The patient's vital signs and level of consciousness  were continuously monitored during the procedure by the interventional radiology nurse under my direct supervision. FLUOROSCOPY: Radiation exposure index: 3 mGy reference air kerma COMPLICATIONS: None immediate. PROCEDURE: The right neck and chest was prepped with chlorhexidine , and draped in the usual sterile fashion using maximum barrier technique (cap and mask, sterile gown, sterile gloves, large sterile sheet, hand hygiene and cutaneous antiseptic). Local anesthesia was attained by infiltration with  1% lidocaine  with epinephrine . Ultrasound demonstrated patency of the right internal jugular vein, and this was documented with an image. Under real-time ultrasound guidance, this vein was accessed with a 21 gauge micropuncture needle and image documentation was performed. A small dermatotomy was made at the access site with an 11 scalpel. A 0.018 wire was advanced into the SVC and the access needle exchanged for a 40F micropuncture vascular sheath. The 0.018 wire was then removed and a 0.035 wire advanced into the IVC. An appropriate location for the subcutaneous reservoir was selected below the clavicle and an incision was made through the skin and underlying soft tissues. The subcutaneous tissues were then dissected using a combination of blunt and sharp surgical technique and a pocket was formed. A Bard Clear Vue single lumen power injectable portacatheter was then tunneled through the subcutaneous tissues from the pocket to the dermatotomy and the port reservoir placed within the subcutaneous pocket. The venous access site was then serially dilated and a peel away vascular sheath placed over the wire. The wire was removed and the port catheter advanced into position under fluoroscopic guidance. The catheter tip is positioned in the superior cavoatrial junction. This was documented with a spot image. The portacatheter was then tested and found to flush and aspirate well. The port was flushed with saline followed by 100 units/mL heparinized saline. The pocket was then closed in two layers using first subdermal inverted interrupted absorbable sutures followed by a running subcuticular suture. The epidermis was then sealed with Dermabond. The dermatotomy at the venous access site was also closed with Dermabond. The port catheter was left accessed and sterile bandages were applied. Next, the facial mass was evaluated. The mass was interrogated with ultrasound. A suitable biopsy entry site was identified in the  submental space allowing passage through relatively normal soft tissues in a region of relatively low vascularity within the mass. The skin was sterilely prepped and draped in the usual fashion using chlorhexidine  skin prep. Local anesthesia was attained by infiltration with 1% lidocaine . A small dermatotomy was made. Under real-time ultrasound guidance, multiple 18 gauge core biopsies were obtained using the Argon automated biopsy device. Biopsy specimens were placed on a moistened Telfa pad and submitted to pathology for further analysis. Post biopsy imaging demonstrates no evidence of active bleeding or other complication. IMPRESSION: Successful placement of a right IJ approach Bard Clear Vue port catheter with ultrasound and fluoroscopic guidance. The catheter is ready for use. Successful ultrasound-guided biopsy of right facial mass. Electronically Signed   By: Wilkie Lent M.D.   On: 10/15/2023 11:35   CT SOFT TISSUE NECK W CONTRAST Result Date: 10/14/2023 CLINICAL DATA:  89 year old male with a rapidly enlarging right face mass and history of follicular lymphoma. EXAM: CT NECK WITH CONTRAST TECHNIQUE: Multidetector CT imaging of the neck was performed using the standard protocol following the bolus administration of intravenous contrast. RADIATION DOSE REDUCTION: This exam was performed according to the departmental dose-optimization program which includes automated exposure control, adjustment of the mA and/or kV according to patient size and/or use of iterative  reconstruction technique. CONTRAST:  75mL OMNIPAQUE  IOHEXOL  300 MG/ML  SOLN COMPARISON:  Face/neck soft tissue ultrasound 08/27/2023. PET-CT 08/25/2023. Neck CT 04/13/2020. FINDINGS: Pharynx and larynx: Larynx and pharynx soft tissue contours, mucosal enhancement remain within normal limits. Negative parapharyngeal and retropharyngeal spaces. Salivary glands: Large, amorphous, relatively homogeneous trans spatial soft tissue mass about the  right mandible body and ramus corresponding to similar appearing mass with intense FDG uptake on June PET-CT. The bulk of the mass is superficial to the mandible oil as in June. This mass with indistinct margins encompasses 95 x 44 by 124 mm (AP by transverse by CC) subcutaneous and dermis involvement. Some involvement of the right lower masticator space. Extension into the right submandibular space with mild mass effect on the right submandibular gland (series 2, image 62). Relatively spared left sublingual space. Left anterior inferior parotid space mildly affected. Contralateral left submandibular, masticator and parotid spaces are negative. Thyroid : Negative. Lymph nodes: Large superficial right face mass described above. Associated contiguous abnormal right level 1A lymph node in the midline which is 18 mm short axis on series 2, image 73. This is new or progressed since 08/25/2023. But little if any additional cervical lymphadenopathy, right level 2 nodes remain subcentimeter. Vascular: Both internal jugular veins in the neck appear partially effaced but remain patent. Other major vascular structures in the bilateral neck and at the skull base are patent. Right greater than left carotid bifurcation atherosclerosis. Limited intracranial: Negative for age. Visualized orbits: Stable since 2022 and negative. Mastoids and visualized paranasal sinuses: Visualized paranasal sinuses and mastoids are stable and well aerated. Skeleton: Despite the bulky adjacent soft tissue mass the right mandible appears to remain intact and normally located. And no acute or suspicious osseous lesion is identified, with chronic cervical spine degeneration. Upper chest: Bilateral lung apex and subpleural mild lung scarring. No visible superior mediastinal or axillary lymphadenopathy. IMPRESSION: 1. Very large and trans-spatial up to 12 cm long axis Lymphomatous Mass about the right mandible, corresponding to the large FDG avid tumor in  June. Adjacent abnormal level 1A lymph node in the midline is new or progressed since June. 2. But no other lymphadenopathy in the Neck or at the thoracic inlet. And the underlying right mandible remains intact. Electronically Signed   By: VEAR Hurst M.D.   On: 10/14/2023 13:16      ASSESSMENT & PLAN:   89 y.o. 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 No abdominal or pelvic involvement.    03/20/18 CT C/A/P revealed Progressive bilateral axillary and right inguinal lymphadenopathy, including necrotic lymph nodes in the right axilla, as above. Spleen is normal in size. 4 mm distal left ureteral calculus, unchanged. No hydronephrosis. Stable layering bladder calculi measuring up to 5 mm. Cholelithiasis, without associated inflammatory changes. Stable sequela of prior/chronic pancreatitis.   11/03/2018 CT neck revealed Marked interval progression of cervical lymphadenopathy since PET-CT 09/02/2017, now with lymphadenopathy present bilaterally at essentially all stations. Index nodes as described. This includes bilateral intraparotid lymphadenopathy. Bulky right-sided adenopathy results in multifocal narrowing of the right internal jugular vein. Significant narrowing of the mid to lower left internal jugular vein of unclear cause as there is no significant mass effect at this level.  11/03/2018 CT CAP revealed 1. Progression  of bilateral axillary, retroperitoneal, pelvic, and inguinal adenopathy. New anterior and posterior lower cervical adenopathy identified within the neck. 2. Persistent distal left ureteral calculus. Small bladder calculi are unchanged. 3. Aortic Atherosclerosis (ICD10-I70.0). Coronary  artery calcifications. 4. Sequelae of chronic pancreatitis noted.  10/01/2019 CT Abd/Pel ([id number]) revealed no evidence of mass in right kidney, new and enlarged abdominal and pelvic lymph nodes.   2. 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 3. H/o head and neck Basal cell carcinoma Continue routine skin checks with dermatologist    4. Renal indeterminate lesion -06/28/19 Renal US  ([id number]) revealed New right-sided mass at the hilum which measures 1.5cm in size.   -resolved on subsequent scans   PLAN:  -Discussed lab results from today, 10/20/2023, in detail with patient -Core needle biopsies performed 7/30  -10/14/2023 CT neck showed: 1. 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 Adjacent abnormal level 1A lymph node in the midline is new or progressed since June. 2. But no other lymphadenopathy in the Neck or at the thoracic inlet. And the underlying right mandible remains intact. -echocardiogram 10/19/2023 shows Left ventricular ejection  fraction, by estimation, is 60 to 65%     FOLLOW-UP: ***  The total time spent in the appointment was *** minutes* .  All of the patient's questions were answered with apparent satisfaction. The patient knows to call the clinic with any problems, questions or concerns.   Emaline Saran MD MS AAHIVMS [facility] Hematology/Oncology Physician [facility]  .*Total Encounter Time as defined by the Centers for Medicare and Medicaid Services includes, in addition to the face-to-face time of a patient visit (documented in the note above) non-face-to-face time: obtaining and reviewing outside history, ordering and reviewing medications, tests or procedures, care coordination (communications with other health care professionals or caregivers) and documentation in the medical record.    I,Mitra Faeizi,acting as a Neurosurgeon for Emaline Saran,  MD.,have documented all relevant documentation on the behalf of Emaline Saran, MD,as directed by  Emaline Saran, MD while in the presence of Emaline Saran, MD.  ***

## 2023-10-20 ENCOUNTER — Ambulatory Visit: Admitting: Hematology

## 2023-10-20 ENCOUNTER — Ambulatory Visit

## 2023-10-20 ENCOUNTER — Inpatient Hospital Stay (HOSPITAL_COMMUNITY)

## 2023-10-20 DIAGNOSIS — L03115 Cellulitis of right lower limb: Secondary | ICD-10-CM

## 2023-10-20 DIAGNOSIS — C8518 Unspecified B-cell lymphoma, lymph nodes of multiple sites: Secondary | ICD-10-CM | POA: Diagnosis not present

## 2023-10-20 DIAGNOSIS — R079 Chest pain, unspecified: Secondary | ICD-10-CM | POA: Diagnosis not present

## 2023-10-20 DIAGNOSIS — I82402 Acute embolism and thrombosis of unspecified deep veins of left lower extremity: Secondary | ICD-10-CM

## 2023-10-20 DIAGNOSIS — R220 Localized swelling, mass and lump, head: Secondary | ICD-10-CM | POA: Diagnosis not present

## 2023-10-20 DIAGNOSIS — I5033 Acute on chronic diastolic (congestive) heart failure: Secondary | ICD-10-CM

## 2023-10-20 DIAGNOSIS — I890 Lymphedema, not elsewhere classified: Secondary | ICD-10-CM | POA: Diagnosis not present

## 2023-10-20 DIAGNOSIS — R7989 Other specified abnormal findings of blood chemistry: Secondary | ICD-10-CM | POA: Diagnosis not present

## 2023-10-20 DIAGNOSIS — R7881 Bacteremia: Secondary | ICD-10-CM | POA: Diagnosis not present

## 2023-10-20 DIAGNOSIS — R6521 Severe sepsis with septic shock: Secondary | ICD-10-CM | POA: Diagnosis not present

## 2023-10-20 DIAGNOSIS — A419 Sepsis, unspecified organism: Secondary | ICD-10-CM | POA: Diagnosis not present

## 2023-10-20 DIAGNOSIS — R509 Fever, unspecified: Secondary | ICD-10-CM

## 2023-10-20 HISTORY — DX: Acute embolism and thrombosis of unspecified deep veins of left lower extremity: I82.402

## 2023-10-20 LAB — BASIC METABOLIC PANEL WITH GFR
Anion gap: 10 (ref 5–15)
BUN: 19 mg/dL (ref 8–23)
CO2: 25 mmol/L (ref 22–32)
Calcium: 7.9 mg/dL — ABNORMAL LOW (ref 8.9–10.3)
Chloride: 104 mmol/L (ref 98–111)
Creatinine, Ser: 0.81 mg/dL (ref 0.61–1.24)
GFR, Estimated: >60 mL/min (ref >60)
Glucose, Bld: 88 mg/dL (ref 70–99)
Potassium: 3.6 mmol/L (ref 3.5–5.1)
Sodium: 139 mmol/L (ref 135–145)

## 2023-10-20 LAB — CBC
HCT: 31.1 % — ABNORMAL LOW (ref 39.0–52.0)
Hemoglobin: 9.8 g/dL — ABNORMAL LOW (ref 13.0–17.0)
MCH: 28.1 pg (ref 26.0–34.0)
MCHC: 31.5 g/dL (ref 30.0–36.0)
MCV: 89.1 fL (ref 80.0–100.0)
Platelets: 177 K/uL (ref 150–400)
RBC: 3.49 MIL/uL — ABNORMAL LOW (ref 4.22–5.81)
RDW: 15.1 % (ref 11.5–15.5)
WBC: 6.7 K/uL (ref 4.0–10.5)
nRBC: 0 % (ref 0.0–0.2)

## 2023-10-20 LAB — HEPARIN LEVEL (UNFRACTIONATED)
Heparin Unfractionated: 0.21 [IU]/mL — ABNORMAL LOW (ref 0.30–0.70)
Heparin Unfractionated: 0.25 [IU]/mL — ABNORMAL LOW (ref 0.30–0.70)
Heparin Unfractionated: 0.26 [IU]/mL — ABNORMAL LOW (ref 0.30–0.70)

## 2023-10-20 LAB — PHOSPHORUS: Phosphorus: 1.9 mg/dL — ABNORMAL LOW (ref 2.5–4.6)

## 2023-10-20 LAB — MAGNESIUM: Magnesium: 2.3 mg/dL (ref 1.7–2.4)

## 2023-10-20 MED ORDER — ACETAMINOPHEN 500 MG PO TABS
500.0000 mg | ORAL_TABLET | Freq: Once | ORAL | Status: AC
  Administered 2023-10-20: 500 mg via ORAL
  Filled 2023-10-20: qty 1

## 2023-10-20 MED ORDER — IOHEXOL 300 MG/ML  SOLN
75.0000 mL | Freq: Once | INTRAMUSCULAR | Status: AC | PRN
  Administered 2023-10-20: 75 mL via INTRAVENOUS

## 2023-10-20 MED ORDER — FUROSEMIDE 10 MG/ML IJ SOLN
40.0000 mg | Freq: Every day | INTRAMUSCULAR | Status: DC
  Administered 2023-10-20 – 2023-10-22 (×3): 40 mg via INTRAVENOUS
  Filled 2023-10-20 (×3): qty 4

## 2023-10-20 MED ORDER — ATENOLOL 25 MG PO TABS
12.5000 mg | ORAL_TABLET | Freq: Every day | ORAL | Status: DC
  Administered 2023-10-20 – 2023-10-22 (×3): 12.5 mg via ORAL
  Filled 2023-10-20 (×4): qty 1

## 2023-10-20 MED ORDER — POTASSIUM PHOSPHATES 15 MMOLE/5ML IV SOLN
30.0000 mmol | Freq: Once | INTRAVENOUS | Status: AC
  Administered 2023-10-20: 30 mmol via INTRAVENOUS
  Filled 2023-10-20: qty 10

## 2023-10-20 MED ORDER — HEPARIN BOLUS VIA INFUSION
1000.0000 [IU] | Freq: Once | INTRAVENOUS | Status: AC
  Administered 2023-10-20: 1000 [IU] via INTRAVENOUS
  Filled 2023-10-20: qty 1000

## 2023-10-20 MED ORDER — OXIDIZED CELLULOSE EX PADS
1.0000 | MEDICATED_PAD | Freq: Once | CUTANEOUS | Status: AC
  Administered 2023-10-20: 1 via TOPICAL
  Filled 2023-10-20: qty 1

## 2023-10-20 MED ORDER — TRAMADOL HCL 50 MG PO TABS: 50.0000 mg | ORAL_TABLET | Freq: Four times a day (QID) | ORAL | Status: DC | PRN

## 2023-10-20 MED ORDER — ACETAMINOPHEN 325 MG PO TABS
650.0000 mg | ORAL_TABLET | Freq: Four times a day (QID) | ORAL | Status: DC | PRN
  Filled 2023-10-20: qty 2

## 2023-10-20 MED ORDER — PENICILLIN G POTASSIUM 20000000 UNITS IJ SOLR
24.0000 10*6.[IU] | INTRAVENOUS | Status: DC
  Administered 2023-10-20 – 2023-10-25 (×7): 24 10*6.[IU] via INTRAVENOUS
  Filled 2023-10-20: qty 20
  Filled 2023-10-20 (×6): qty 24

## 2023-10-20 NOTE — Progress Notes (Signed)
 eLink Physician-Brief Progress Note Patient Name: Harold [name] DOB: 08/30/1934 MRN: [id number]   Date of Service  10/20/2023  HPI/Events of Note  C/o cheek pain. Has a facial mass there. No PRNs for pain in his MAR.    eICU Interventions  Tylenol  oral once ordered.      Intervention Category Intermediate Interventions: Pain - evaluation and management  [name] 10/20/2023, 6:02 AM

## 2023-10-20 NOTE — Progress Notes (Signed)
 PHARMACY - ANTICOAGULATION CONSULT NOTE  Pharmacy Consult for Heparin  Indication: chest pain/ACS  No Known Allergies  Patient Measurements: Height: 5' 6 (167.6 cm) Weight: 64 kg (141 lb 1.5 oz) IBW/kg (Calculated) : 63.8 HEPARIN  DW (KG): 64  Vital Signs: Temp: 97.7 F (36.5 C) (08/04 1918) Temp Source: Oral (08/04 1918) BP: 95/46 (08/04 1900) Pulse Rate: 81 (08/04 1900)  Labs: Recent Labs    10/18/23 0205 10/18/23 0409 10/18/23 1543 10/18/23 1826 10/18/23 1929 10/18/23 2335 10/19/23 0242 10/19/23 1247 10/19/23 1606 10/20/23 0014 10/20/23 0552 10/20/23 1109 10/20/23 1901  HGB 11.7*  --   --   --   --   --  9.3*  --   --   --  9.8*  --   --   HCT 36.5*  --   --   --   --   --  30.3*  --   --   --  31.1*  --   --   PLT 235  --   --   --   --   --  199  --   --   --  177  --   --   APTT  --   --   --  31  --   --   --   --   --   --   --   --   --   LABPROT  --   --   --  16.1*  --   --   --   --   --   --   --   --   --   INR  --   --   --  1.2  --   --   --   --   --   --   --   --   --   HEPARINUNFRC  --   --   --   --   --   --  <0.10*   < >  --  0.21*  --  0.26* 0.25*  CREATININE 0.77  --   --   --   --   --  0.85  --  0.94  --  0.81  --   --   TROPONINIHS 18*   < > 735*  --  575* 539*  --   --   --   --   --   --   --    < > = values in this interval not displayed.    Estimated Creatinine Clearance: 59.1 mL/min (by C-G formula based on SCr of 0.81 mg/dL).  Assessment: Pharmacy consulted to manage IV heparin  for ACS/STEMI.  Evaluated today by cardiology for chest pain and increasingly elevated troponins.   10/19/23 Heparin  level remains low and unchanged as expected after repeating heparin  level on previous rate of 1400 units/hr Pt with bleeding/oozing at [facility] this morning >> RN applied surgicel pad with improvement Dopplers + age indeterminate LLE DVT Hg 9.8, stable, PLT 177 WNL CT soft tissue of neck negative for hemorrhage  Goal of Therapy:  Heparin   level 0.3-0.7 units/ml Monitor platelets by anticoagulation protocol: Yes   Plan:  - increase heparin  gtt to 1550 units/hr  - recheck heparin  level in 8 hr - Monitor daily heparin  level when titrated to goal, CBC, signs/symptoms of bleeding - f/u transition to DOAC once no invasive procedures planned (may need PAC removal)   Bard Jeans, PharmD, BCPS [phone] 10/20/2023, 7:47 PM

## 2023-10-20 NOTE — Plan of Care (Signed)
  Problem: Clinical Measurements: Goal: Respiratory complications will improve Outcome: Progressing Goal: Cardiovascular complication will be avoided Outcome: Progressing   Problem: Activity: Goal: Risk for activity intolerance will decrease Outcome: Progressing   Problem: Nutrition: Goal: Adequate nutrition will be maintained Outcome: Progressing   Problem: Coping: Goal: Level of anxiety will decrease Outcome: Progressing   Problem: Elimination: Goal: Will not experience complications related to urinary retention Outcome: Progressing   Problem: Pain Managment: Goal: General experience of comfort will improve and/or be controlled Outcome: Progressing   Problem: Safety: Goal: Ability to remain free from injury will improve Outcome: Progressing

## 2023-10-20 NOTE — Progress Notes (Cosign Needed)
 Harold [name]   DOB:[birth year]   FM#:[id number]      ASSESSMENT & PLAN:  Harold [name]  is an 89 year old male patient with oncologic history significant for follicular lymphoma.  Patient was discharged from inpatient on 10/16/23 and was scheduled to start chemotherapy 10/20/22.  Readmitted on 10/18/23 with complaints of chest pain, elevated troponin, bacteremia.    Septic shock Bacteremia -Patient admitted on 10/18/2023 with fever and weakness. - Blood cultures positive for strep.  Continue IV antibiotics as ordered.  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 Likely progression to high-grade. --LN biopsy of right axilla done 6/11, showed low grade FL. - Pending path of facial mass done 10/15/2023.  -- CT neck done 7/29 shows very large up to 12 cm lymphomatous mass about the right mandible, no other lymphadenopathy in the neck or thoracic inlet.  --Port placed 7/30 for chemotherapy  -Chemotherapy R-CEOP was scheduled as outpatient 8/4.  Will plan chemotherapy as inpatient when cleared for administration. - Medical oncology/Dr. [name] will make further treatment recommendations.   Anemia, normocytic - Mild - Hemoglobin 9.8 -No transfusional intervention required at this time - Continue to monitor CBC with differential   History of melanoma - On chin - Status post resection   History of head and neck basal cell carcinoma - Continue close follow-up with Derm    Code Status DNR-Limited  Subjective:  Patient seen awake and alert laying in bed in ICU with IV antibiotics infusing well.  Reports he has no further chest pain and that he had some chest discomfort prior to  arrival.  He does admit to right facial tightness and pain previously  Objective:   Intake/Output Summary (Last 24 hours) at 10/20/2023 1049 Last data filed at 10/20/2023 0941 Gross per 24 hour  Intake 2261.61 ml  Output 975 ml  Net 1286.61 ml     PHYSICAL EXAMINATION: ECOG PERFORMANCE STATUS: 3 - Symptomatic, >50% confined to bed  Vitals:   10/20/23 0700 10/20/23 0800  BP: (!) 142/61   Pulse: 62   Resp: 11   Temp:  97.6 F (36.4 C)  SpO2: 98%    Filed Weights   10/19/23 0439 10/20/23 0500 10/20/23 0600  Weight: 153 lb 14.1 oz (69.8 kg) 152 lb 5.4 oz (69.1 kg) 141 lb 1.5 oz (64 kg)    GENERAL: alert, + right facial mass + ill-appearing SKIN: skin color, texture, turgor are normal, no rashes or significant lesions EYES: normal, conjunctiva are pink and non-injected, sclera clear OROPHARYNX: no exudate, no erythema and lips, buccal mucosa, and tongue normal  NECK: supple, thyroid  normal size, non-tender, without nodularity LYMPH: no palpable lymphadenopathy in the cervical, axillary or inguinal LUNGS: clear to auscultation and percussion with normal breathing effort HEART: regular rate & rhythm and no murmurs and no lower extremity edema ABDOMEN: abdomen soft, non-tender and normal bowel sounds MUSCULOSKELETAL: no cyanosis of digits and no clubbing  PSYCH: alert & oriented x 3 with fluent speech NEURO: no focal motor/sensory deficits   All questions were answered. The patient knows to call the clinic with any problems, questions or concerns.   The total time spent in the appointment was 40 minutes encounter with patient including review of chart and various tests results, discussions  about plan of care and coordination of care plan  [name], NP 10/20/2023 10:49 AM    Labs Reviewed:  Lab Results  Component Value Date   WBC 6.7 10/20/2023   HGB 9.8 (L) 10/20/2023   HCT 31.1 (L) 10/20/2023   MCV 89.1 10/20/2023   PLT 177 10/20/2023   Recent Labs     09/15/23 0931 09/29/23 1120 10/13/23 1212 10/16/23 0700 10/19/23 0242 10/19/23 1606 10/20/23 0552  NA 145 140 143   < > 139 138 139  K 3.3* 4.0 3.6   < > 3.2* 3.7 3.6  CL 104 103 103   < > 108 104 104  CO2 32 32 35*   < > 23 27 25   GLUCOSE 96 88 85   < > 122* 98 88  BUN 29* 18 22   < > 27* 22 19  CREATININE 1.13 0.96 1.00   < > 0.85 0.94 0.81  CALCIUM 9.7 8.9 8.9   < > 8.2* 8.3* 7.9*  GFRNONAA >60 >60 >60   < > >60 >60 >60  PROT 6.9 5.9* 6.1*  --   --   --   --   ALBUMIN 4.3 3.6 3.6  --   --   --   --   AST 14* 22 15  --   --   --   --   ALT 12 16 10   --   --   --   --   ALKPHOS 61 51 53  --   --   --   --   BILITOT 0.8 0.7 0.5  --   --   --   --    < > = values in this interval not displayed.    Studies Reviewed:  VAS US  LOWER EXTREMITY VENOUS (DVT) Result Date: 10/19/2023  Lower Venous DVT Study Patient Name:  Harold [name]  Date of Exam:   10/18/2023 Medical Rec #: [id number]       Accession #:    [id number] Date of Birth: 07-21-1934       Patient Gender: M Patient Age:   42 years Exam Location:  [facility] Procedure:      VAS US  LOWER EXTREMITY VENOUS (DVT) Referring Phys: LAMAR CHRIS --------------------------------------------------------------------------------  Indications: Right > L Edema.  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 Chronic HFpEF. Limitations: Pitting edema, involuntary patient movement. Comparison Study: Prior bilateral LEV done 05/05/20 indicating chronic left                   peroneal DVT Performing Technologist: Alberta Lis RVS  Examination Guidelines: A complete evaluation includes B-mode imaging, spectral Doppler, color Doppler, and power Doppler as needed of all accessible portions of each vessel. Bilateral testing is considered an integral part of a complete examination. Limited examinations for reoccurring indications may be performed as noted. The reflux portion  of the exam is performed with the patient in reverse Trendelenburg.  +---------+---------------+---------+-----------+----------+--------------+ RIGHT    CompressibilityPhasicitySpontaneityPropertiesThrombus Aging +---------+---------------+---------+-----------+----------+--------------+ CFV      Full           Yes      No                                  +---------+---------------+---------+-----------+----------+--------------+ SFJ      Full                                                        +---------+---------------+---------+-----------+----------+--------------+  FV Prox  Full           Yes      No                                  +---------+---------------+---------+-----------+----------+--------------+ FV Mid   Full                                                        +---------+---------------+---------+-----------+----------+--------------+ FV DistalFull                                                        +---------+---------------+---------+-----------+----------+--------------+ PFV      Full           Yes      No                                  +---------+---------------+---------+-----------+----------+--------------+ POP      Full           Yes      No                                  +---------+---------------+---------+-----------+----------+--------------+ PTV      Full                                                        +---------+---------------+---------+-----------+----------+--------------+ PERO     Full                                                        +---------+---------------+---------+-----------+----------+--------------+   +---------+---------------+---------+-----------+----------+-------------------+ LEFT     CompressibilityPhasicitySpontaneityPropertiesThrombus Aging      +---------+---------------+---------+-----------+----------+-------------------+ CFV      Full           Yes      No                                        +---------+---------------+---------+-----------+----------+-------------------+ SFJ      Full                                                             +---------+---------------+---------+-----------+----------+-------------------+ FV Prox  Full           Yes      No                                       +---------+---------------+---------+-----------+----------+-------------------+  FV Mid   Full                                                             +---------+---------------+---------+-----------+----------+-------------------+ FV DistalFull           Yes      No                                       +---------+---------------+---------+-----------+----------+-------------------+ PFV      Full                                                             +---------+---------------+---------+-----------+----------+-------------------+ POP                     Yes      No                   patent by color and                                                       Doppler             +---------+---------------+---------+-----------+----------+-------------------+ PTV      None                                         Age Indeterminate   +---------+---------------+---------+-----------+----------+-------------------+ PERO     None                                         Age Indeterminate   +---------+---------------+---------+-----------+----------+-------------------+     Summary: RIGHT: - There is no evidence of deep vein thrombosis in the lower extremity.  Interstitial edema noted throughout the extremity  LEFT: - Findings consistent with age indeterminate deep vein thrombosis involving the left posterior tibial veins, and left peroneal veins.  Interstitial edema noted throughout the extremity.  *See table(s) above for measurements and observations. Electronically signed by Debby Robertson on 10/19/2023 at  3:36:23 PM.    Final    ECHOCARDIOGRAM COMPLETE Result Date: 10/19/2023    ECHOCARDIOGRAM REPORT   Patient Name:   Harold [name] Date of Exam: 10/19/2023 Medical Rec #:  [id number]      Height:       66.0 in Accession #:    [id number]     Weight:       153.9 lb Date of Birth:  [birth year]      BSA:          1.789 m Patient Age:    88 years       BP:  111/48 mmHg Patient Gender: M              HR:           69 bpm. Exam Location:  Inpatient Procedure: 2D Echo, Cardiac Doppler, Color Doppler, Strain Analysis and 3D Echo            (Both Spectral and Color Flow Doppler were utilized during            procedure). Indications:    Shock  History:        Patient has prior history of Echocardiogram examinations, most                 recent 10/15/2023. CAD and Previous Myocardial Infarction, Mitral                 Valve Disease and Aortic Valve Disease; Risk                 Factors:Hypertension and Dyslipidemia.  Sonographer:    Therisa Crouch Referring Phys: [id number] ROBERT S BYRUM IMPRESSIONS  1. No new RWMA;s since TTE done 10/15/23. Left ventricular ejection fraction, by estimation, is 60 to 65%. The left ventricle has normal function. The left ventricle has no regional wall motion abnormalities. Left ventricular diastolic parameters are consistent with Grade II diastolic dysfunction (pseudonormalization). Elevated left ventricular end-diastolic pressure. The average left ventricular global longitudinal strain is -19.9 %. The global longitudinal strain is normal.  2. Right ventricular systolic function is normal. The right ventricular size is normal.  3. Right atrial size was mildly dilated.  4. The mitral valve is degenerative. Trivial mitral valve regurgitation. Mild mitral stenosis. Moderate mitral annular calcification.  5. Tricuspid valve regurgitation is moderate.  6. The aortic valve is tricuspid. There is moderate calcification of the aortic valve. There is moderate thickening of the aortic valve. Aortic valve  regurgitation is not visualized. Mild to moderate aortic valve stenosis.  7. The inferior vena cava is normal in size with greater than 50% respiratory variability, suggesting right atrial pressure of 3 mmHg. FINDINGS  Left Ventricle: No new RWMA;s since TTE done 10/15/23. Left ventricular ejection fraction, by estimation, is 60 to 65%. The left ventricle has normal function. The left ventricle has no regional wall motion abnormalities. The average left ventricular global longitudinal strain is -19.9 %. Strain was performed and the global longitudinal strain is normal. The left ventricular internal cavity size was normal in size. There is no left ventricular hypertrophy. Left ventricular diastolic parameters are consistent with Grade II diastolic dysfunction (pseudonormalization). Elevated left ventricular end-diastolic pressure. Right Ventricle: The right ventricular size is normal. No increase in right ventricular wall thickness. Right ventricular systolic function is normal. Left Atrium: Left atrial size was normal in size. Right Atrium: Right atrial size was mildly dilated. Pericardium: There is no evidence of pericardial effusion. Mitral Valve: The mitral valve is degenerative in appearance. Moderate mitral annular calcification. Trivial mitral valve regurgitation. Mild mitral valve stenosis. MV peak gradient, 8.9 mmHg. The mean mitral valve gradient is 5.0 mmHg. Tricuspid Valve: The tricuspid valve is normal in structure. Tricuspid valve regurgitation is moderate . No evidence of tricuspid stenosis. Aortic Valve: The aortic valve is tricuspid. There is moderate calcification of the aortic valve. There is moderate thickening of the aortic valve. Aortic valve regurgitation is not visualized. Mild to moderate aortic stenosis is present. Aortic valve mean gradient measures 11.7 mmHg. Aortic valve peak gradient measures 20.4 mmHg. Aortic valve area, by VTI measures 1.32  cm. Pulmonic Valve: The pulmonic valve was  normal in structure. Pulmonic valve regurgitation is trivial. No evidence of pulmonic stenosis. Aorta: The aortic root is normal in size and structure. Venous: The inferior vena cava is normal in size with greater than 50% respiratory variability, suggesting right atrial pressure of 3 mmHg. IAS/Shunts: No atrial level shunt detected by color flow Doppler. Additional Comments: 3D was performed not requiring image post processing on an independent workstation and was indeterminate.  LEFT VENTRICLE PLAX 2D LVIDd:         3.80 cm   Diastology LVIDs:         2.50 cm   LV e' medial:    6.64 cm/s LV PW:         0.90 cm   LV E/e' medial:  17.3 LV IVS:        0.90 cm   LV e' lateral:   7.83 cm/s LVOT diam:     2.00 cm   LV E/e' lateral: 14.7 LV SV:         66 LV SV Index:   37        2D Longitudinal Strain LVOT Area:     3.14 cm  2D Strain GLS Avg:     -19.9 %                           3D Volume EF                          LV 3D EDV:   103.42 ml                          LV 3D ESV:   39.24 ml RIGHT VENTRICLE             IVC RV Basal diam:  3.60 cm     IVC diam: 3.00 cm RV S prime:     13.30 cm/s TAPSE (M-mode): 2.4 cm LEFT ATRIUM             Index        RIGHT ATRIUM           Index LA diam:        3.60 cm 2.01 cm/m   RA Area:     20.60 cm LA Vol (A2C):   77.6 ml 43.38 ml/m  RA Volume:   55.40 ml  30.97 ml/m LA Vol (A4C):   75.6 ml 42.26 ml/m LA Biplane Vol: 77.3 ml 43.21 ml/m  AORTIC VALVE AV Area (Vmax):    1.27 cm AV Area (Vmean):   1.23 cm AV Area (VTI):     1.32 cm AV Vmax:           226.00 cm/s AV Vmean:          158.333 cm/s AV VTI:            0.505 m AV Peak Grad:      20.4 mmHg AV Mean Grad:      11.7 mmHg LVOT Vmax:         91.05 cm/s LVOT Vmean:        62.100 cm/s LVOT VTI:          0.212 m LVOT/AV VTI ratio: 0.42  AORTA Ao Root diam: 3.50 cm Ao Asc diam:  3.20 cm MITRAL VALVE  TRICUSPID VALVE MV Area (PHT): 3.17 cm     TR Peak grad:   34.8 mmHg MV Area VTI:   1.32 cm     TR Vmax:         295.00 cm/s MV Peak grad:  8.9 mmHg MV Mean grad:  5.0 mmHg     SHUNTS MV Vmax:       1.49 m/s     Systemic VTI:  0.21 m MV Vmean:      106.5 cm/s   Systemic Diam: 2.00 cm MV Decel Time: 239 msec MV E velocity: 115.00 cm/s MV A velocity: 130.00 cm/s MV E/A ratio:  0.88 Maude Emmer MD Electronically signed by Maude Emmer MD Signature Date/Time: 10/19/2023/10:56:55 AM    Final    DG Chest Port 1 View Result Date: 10/18/2023 EXAM: 1 VIEW XRAY OF THE CHEST 10/18/2023 02:13:00 AM COMPARISON: 01/01/2011 CLINICAL HISTORY: 355200 Chest pain [id number]. Per chart: Patient BIB EMS for chest pain and fever that started last night. Patient was seen here earlier this week and had port implanted for chemotherapy related to Non-Hodgkin's lymphoma. Patient given 324 ASA and 1 inch nitropaste from EMS. Denies any pain at this time. FINDINGS: LUNGS AND PLEURA: No focal pulmonary opacity. No pulmonary edema. No pleural effusion. No pneumothorax. HEART AND MEDIASTINUM: No acute abnormality of the cardiac and mediastinal silhouettes. BONES AND SOFT TISSUES: No acute osseous abnormality. Right chest wall port-a-cath with tip in the lower SVC. IMPRESSION: 1. No acute process. Electronically signed by: Franky Stanford MD 10/18/2023 02:24 AM EDT RP Workstation: [id number]   ECHOCARDIOGRAM COMPLETE Result Date: 10/15/2023    ECHOCARDIOGRAM REPORT   Patient Name:   Harold [name] Date of Exam: 10/15/2023 Medical Rec #:  [id number]      Height:       66.0 in Accession #:    [id number]     Weight:       155.3 lb Date of Birth:  01/27/1935      BSA:          1.796 m Patient Age:    88 years       BP:           95/50 mmHg Patient Gender: M              HR:           68 bpm. Exam Location:  Inpatient Procedure: 2D Echo, 3D Echo, Strain Analysis, Cardiac Doppler and Color Doppler            (Both Spectral and Color Flow Doppler were utilized during            procedure). Indications:    CAD native vessel I25.10  History:        Patient has prior  history of Echocardiogram examinations, most                 recent 01/29/2023. CAD and Previous Myocardial Infarction,                 Mitral Valve Disease; Risk Factors:Hypertension and                 Dyslipidemia.  Sonographer:    Koleen Popper RDCS Referring Phys: [phone] ANAND D HONGALGI  Sonographer Comments: Global longitudinal strain was attempted. IMPRESSIONS  1. Left ventricular ejection fraction, by estimation, is 60 to 65%. Left ventricular ejection fraction by 3D volume is 62 %. The left ventricle has normal function. The left ventricle has  no regional wall motion abnormalities. Left ventricular diastolic  parameters are consistent with Grade II diastolic dysfunction (pseudonormalization). The average left ventricular global longitudinal strain is -19.9 %. The global longitudinal strain is normal.  2. Right ventricular systolic function is mildly reduced. The right ventricular size is mildly enlarged. There is mildly elevated pulmonary artery systolic pressure. The estimated right ventricular systolic pressure is 41.6 mmHg.  3. The mitral valve is normal in structure. Mild mitral valve regurgitation. Mild mitral stenosis. Severe mitral annular calcification.  4. The tricuspid valve is degenerative. Tricuspid valve regurgitation is moderate.  5. The aortic valve has an indeterminant number of cusps. There is severe calcifcation of the aortic valve. Aortic valve regurgitation is mild. Mild aortic valve stenosis. Aortic valve Vmax measures 2.17 m/s.  6. The inferior vena cava is dilated in size with <50% respiratory variability, suggesting right atrial pressure of 15 mmHg. FINDINGS  Left Ventricle: Left ventricular ejection fraction, by estimation, is 60 to 65%. Left ventricular ejection fraction by 3D volume is 62 %. The left ventricle has normal function. The left ventricle has no regional wall motion abnormalities. The average left ventricular global longitudinal strain is -19.9 %. Strain was performed  and the global longitudinal strain is normal. The left ventricular internal cavity size was normal in size. There is no left ventricular hypertrophy. Left ventricular diastolic parameters are consistent with Grade II diastolic dysfunction (pseudonormalization). Right Ventricle: The right ventricular size is mildly enlarged. No increase in right ventricular wall thickness. Right ventricular systolic function is mildly reduced. There is mildly elevated pulmonary artery systolic pressure. The tricuspid regurgitant  velocity is 2.58 m/s, and with an assumed right atrial pressure of 15 mmHg, the estimated right ventricular systolic pressure is 41.6 mmHg. Left Atrium: Left atrial size was normal in size. Right Atrium: Right atrial size was normal in size. Pericardium: There is no evidence of pericardial effusion. Mitral Valve: The mitral valve is normal in structure. Severe mitral annular calcification. Mild mitral valve regurgitation. Mild mitral valve stenosis. MV peak gradient, 12.7 mmHg. The mean mitral valve gradient is 4.0 mmHg. Tricuspid Valve: The tricuspid valve is degenerative in appearance. Tricuspid valve regurgitation is moderate . No evidence of tricuspid stenosis. Aortic Valve: The aortic valve has an indeterminant number of cusps. There is severe calcifcation of the aortic valve. Aortic valve regurgitation is mild. Mild aortic stenosis is present. Aortic valve mean gradient measures 11.0 mmHg. Aortic valve peak gradient measures 18.8 mmHg. Aortic valve area, by VTI measures 1.07 cm. Pulmonic Valve: The pulmonic valve was grossly normal. Pulmonic valve regurgitation is mild. No evidence of pulmonic stenosis. Aorta: The aortic root is normal in size and structure. Venous: The inferior vena cava is dilated in size with less than 50% respiratory variability, suggesting right atrial pressure of 15 mmHg. IAS/Shunts: No atrial level shunt detected by color flow Doppler. Additional Comments: 3D was performed not  requiring image post processing on an independent workstation and was normal.  LEFT VENTRICLE PLAX 2D LVIDd:         3.60 cm         Diastology LVIDs:         2.30 cm         LV e' medial:    6.20 cm/s LV PW:         1.00 cm         LV E/e' medial:  18.9 LV IVS:        1.00 cm  LV e' lateral:   5.77 cm/s LVOT diam:     1.50 cm         LV E/e' lateral: 20.3 LV SV:         46 LV SV Index:   26              2D Longitudinal LVOT Area:     1.77 cm        Strain                                2D Strain GLS   -19.9 %                                Avg:                                 3D Volume EF                                LV 3D EF:    Left                                             ventricul                                             ar                                             ejection                                             fraction                                             by 3D                                             volume is                                             62 %.                                 3D Volume EF:  3D EF:        62 %                                LV EDV:       93 ml                                LV ESV:       35 ml                                LV SV:        58 ml RIGHT VENTRICLE             IVC RV Basal diam:  4.30 cm     IVC diam: 2.40 cm RV Mid diam:    3.80 cm RV S prime:     14.90 cm/s TAPSE (M-mode): 2.0 cm LEFT ATRIUM             Index        RIGHT ATRIUM           Index LA diam:        3.30 cm 1.84 cm/m   RA Area:     12.80 cm LA Vol (A2C):   37.9 ml 21.10 ml/m  RA Volume:   29.90 ml  16.65 ml/m LA Vol (A4C):   35.3 ml 19.65 ml/m LA Biplane Vol: 39.5 ml 21.99 ml/m  AORTIC VALVE AV Area (Vmax):    1.07 cm AV Area (Vmean):   0.94 cm AV Area (VTI):     1.07 cm AV Vmax:           217.00 cm/s AV Vmean:          157.000 cm/s AV VTI:            0.432 m AV Peak Grad:      18.8 mmHg AV Mean Grad:      11.0 mmHg LVOT Vmax:          131.00 cm/s LVOT Vmean:        83.200 cm/s LVOT VTI:          0.261 m LVOT/AV VTI ratio: 0.60  AORTA Ao Root diam: 2.70 cm Ao Asc diam:  3.30 cm MITRAL VALVE                TRICUSPID VALVE MV Area (PHT): 2.16 cm     TR Peak grad:   26.6 mmHg MV Area VTI:   1.02 cm     TR Vmax:        258.00 cm/s MV Peak grad:  12.7 mmHg MV Mean grad:  4.0 mmHg     SHUNTS MV Vmax:       1.78 m/s     Systemic VTI:  0.26 m MV Vmean:      95.9 cm/s    Systemic Diam: 1.50 cm MV Decel Time: 351 msec MV E velocity: 117.00 cm/s MV A velocity: 149.00 cm/s MV E/A ratio:  0.79 Morene Brownie Electronically signed by Morene Brownie Signature Date/Time: 10/15/2023/6:24:54 PM    Final    IR IMAGING GUIDED PORT INSERTION Result Date: 10/15/2023 INDICATION: 89 year old male with a known history of low-grade follicular lymphoma with enlarging face all mass concerning for transformation to high-grade lymphoma. He presents  for port catheter placement to establish durable venous access in preparation for chemotherapy as well as ultrasound-guided core biopsy of the facial mass. EXAM: IMPLANTED PORT A CATH PLACEMENT WITH ULTRASOUND AND FLUOROSCOPIC GUIDANCE ULTRASOUND-GUIDED CORE BIOPSY MEDICATIONS: None. ANESTHESIA/SEDATION: Versed  2 mg IV; Fentanyl  100 mcg IV; administered by the radiology nurse Moderate Sedation Time:  42 minutes The patient's vital signs and level of consciousness were continuously monitored during the procedure by the interventional radiology nurse under my direct supervision. FLUOROSCOPY: Radiation exposure index: 3 mGy reference air kerma COMPLICATIONS: None immediate. PROCEDURE: The right neck and chest was prepped with chlorhexidine , and draped in the usual sterile fashion using maximum barrier technique (cap and mask, sterile gown, sterile gloves, large sterile sheet, hand hygiene and cutaneous antiseptic). Local anesthesia was attained by infiltration with 1% lidocaine  with epinephrine . Ultrasound demonstrated patency  of the right internal jugular vein, and this was documented with an image. Under real-time ultrasound guidance, this vein was accessed with a 21 gauge micropuncture needle and image documentation was performed. A small dermatotomy was made at the access site with an 11 scalpel. A 0.018 wire was advanced into the SVC and the access needle exchanged for a 5F micropuncture vascular sheath. The 0.018 wire was then removed and a 0.035 wire advanced into the IVC. An appropriate location for the subcutaneous reservoir was selected below the clavicle and an incision was made through the skin and underlying soft tissues. The subcutaneous tissues were then dissected using a combination of blunt and sharp surgical technique and a pocket was formed. A Bard Clear Vue single lumen power injectable portacatheter was then tunneled through the subcutaneous tissues from the pocket to the dermatotomy and the port reservoir placed within the subcutaneous pocket. The venous access site was then serially dilated and a peel away vascular sheath placed over the wire. The wire was removed and the port catheter advanced into position under fluoroscopic guidance. The catheter tip is positioned in the superior cavoatrial junction. This was documented with a spot image. The portacatheter was then tested and found to flush and aspirate well. The port was flushed with saline followed by 100 units/mL heparinized saline. The pocket was then closed in two layers using first subdermal inverted interrupted absorbable sutures followed by a running subcuticular suture. The epidermis was then sealed with Dermabond. The dermatotomy at the venous access site was also closed with Dermabond. The port catheter was left accessed and sterile bandages were applied. Next, the facial mass was evaluated. The mass was interrogated with ultrasound. A suitable biopsy entry site was identified in the submental space allowing passage through relatively normal soft  tissues in a region of relatively low vascularity within the mass. The skin was sterilely prepped and draped in the usual fashion using chlorhexidine  skin prep. Local anesthesia was attained by infiltration with 1% lidocaine . A small dermatotomy was made. Under real-time ultrasound guidance, multiple 18 gauge core biopsies were obtained using the Argon automated biopsy device. Biopsy specimens were placed on a moistened Telfa pad and submitted to pathology for further analysis. Post biopsy imaging demonstrates no evidence of active bleeding or other complication. IMPRESSION: Successful placement of a right IJ approach Bard Clear Vue port catheter with ultrasound and fluoroscopic guidance. The catheter is ready for use. Successful ultrasound-guided biopsy of right facial mass. Electronically Signed   By: Wilkie Lent M.D.   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 He presents for port catheter placement to establish durable venous access in preparation for chemotherapy as well as ultrasound-guided core biopsy of the facial mass. EXAM: IMPLANTED PORT A CATH PLACEMENT WITH ULTRASOUND AND FLUOROSCOPIC GUIDANCE ULTRASOUND-GUIDED CORE BIOPSY MEDICATIONS: None. ANESTHESIA/SEDATION: Versed  2 mg IV; Fentanyl  100 mcg IV; administered by the radiology nurse Moderate Sedation Time:  42 minutes The patient's vital signs and level of consciousness were continuously monitored during the procedure by the interventional radiology nurse under my direct supervision. FLUOROSCOPY: Radiation exposure index: 3 mGy reference air kerma COMPLICATIONS: None immediate. PROCEDURE: The right neck and chest was prepped with chlorhexidine , and draped in the usual sterile fashion using maximum barrier technique (cap and mask, sterile gown, sterile  gloves, large sterile sheet, hand hygiene and cutaneous antiseptic). Local anesthesia was attained by infiltration with 1% lidocaine  with epinephrine . Ultrasound demonstrated patency of the right internal jugular vein, and this was documented with an image. Under real-time ultrasound guidance, this vein was accessed with a 21 gauge micropuncture needle and image documentation was performed. A small dermatotomy was made at the access site with an 11 scalpel. A 0.018 wire was advanced into the SVC and the access needle exchanged for a 4F micropuncture vascular sheath. The 0.018 wire was then removed and a 0.035 wire advanced into the IVC. An appropriate location for the subcutaneous reservoir was selected below the clavicle and an incision was made through the skin and underlying soft tissues. The subcutaneous tissues were then dissected using a combination of blunt and sharp surgical technique and a pocket was formed. A Bard Clear Vue single lumen power injectable portacatheter was then tunneled through the subcutaneous tissues from the pocket to the dermatotomy and the port reservoir placed within the subcutaneous pocket. The venous access site was then serially dilated and a peel away vascular sheath placed over the wire. The wire was removed and the port catheter advanced into position under fluoroscopic guidance. The catheter tip is positioned in the superior cavoatrial junction. This was documented with a spot image. The portacatheter was then tested and found to flush and aspirate well. The port was flushed with saline followed by 100 units/mL heparinized saline. The pocket was then closed in two layers using first subdermal inverted interrupted absorbable sutures followed by a running subcuticular suture. The epidermis was then sealed with Dermabond. The dermatotomy at the venous access site was also closed with Dermabond. The port catheter was left accessed and sterile bandages were applied. Next, the  facial mass was evaluated. The mass was interrogated with ultrasound. A suitable biopsy entry site was identified in the submental space allowing passage through relatively normal soft tissues in a region of relatively low vascularity within the mass. The skin was sterilely prepped and draped in the usual fashion using chlorhexidine  skin prep. Local anesthesia was attained by infiltration with 1% lidocaine . A small dermatotomy was made. Under real-time ultrasound guidance, multiple 18 gauge core biopsies were obtained using the Argon automated biopsy device. Biopsy specimens were placed on a moistened Telfa pad and submitted to pathology for further analysis. Post biopsy imaging demonstrates no evidence of active bleeding or other complication. IMPRESSION: Successful placement of a right IJ approach Bard Clear Vue port catheter with ultrasound and fluoroscopic guidance. The catheter is ready for use. Successful ultrasound-guided biopsy of right facial mass. Electronically Signed   By: Wilkie Lent M.D.   On: 10/15/2023 11:35  CT SOFT TISSUE NECK W CONTRAST Result Date: 10/14/2023 CLINICAL DATA:  89 year old male with a rapidly enlarging right face mass and history of follicular lymphoma. EXAM: CT NECK WITH CONTRAST TECHNIQUE: Multidetector CT imaging of the neck was performed using the standard protocol following the bolus administration of intravenous contrast. RADIATION DOSE REDUCTION: This exam was performed according to the departmental dose-optimization program which includes automated exposure control, adjustment of the mA and/or kV according to patient size and/or use of iterative reconstruction technique. CONTRAST:  75mL OMNIPAQUE  IOHEXOL  300 MG/ML  SOLN COMPARISON:  Face/neck soft tissue ultrasound 08/27/2023. PET-CT 08/25/2023. Neck CT 04/13/2020. FINDINGS: Pharynx and larynx: Larynx and pharynx soft tissue contours, mucosal enhancement remain within normal limits. Negative parapharyngeal and  retropharyngeal spaces. Salivary glands: Large, amorphous, relatively homogeneous trans spatial soft tissue mass about the right mandible body and ramus corresponding to similar appearing mass with intense FDG uptake on June PET-CT. The bulk of the mass is superficial to the mandible oil as in June. This mass with indistinct margins encompasses 95 x 44 by 124 mm (AP by transverse by CC) subcutaneous and dermis involvement. Some involvement of the right lower masticator space. Extension into the right submandibular space with mild mass effect on the right submandibular gland (series 2, image 62). Relatively spared left sublingual space. Left anterior inferior parotid space mildly affected. Contralateral left submandibular, masticator and parotid spaces are negative. Thyroid : Negative. Lymph nodes: Large superficial right face mass described above. Associated contiguous abnormal right level 1A lymph node in the midline which is 18 mm short axis on series 2, image 73. This is new or progressed since 08/25/2023. But little if any additional cervical lymphadenopathy, right level 2 nodes remain subcentimeter. Vascular: Both internal jugular veins in the neck appear partially effaced but remain patent. Other major vascular structures in the bilateral neck and at the skull base are patent. Right greater than left carotid bifurcation atherosclerosis. Limited intracranial: Negative for age. Visualized orbits: Stable since 2022 and negative. Mastoids and visualized paranasal sinuses: Visualized paranasal sinuses and mastoids are stable and well aerated. Skeleton: Despite the bulky adjacent soft tissue mass the right mandible appears to remain intact and normally located. And no acute or suspicious osseous lesion is identified, with chronic cervical spine degeneration. Upper chest: Bilateral lung apex and subpleural mild lung scarring. No visible superior mediastinal or axillary lymphadenopathy. IMPRESSION: 1. Very large and  trans-spatial up to 12 cm long axis Lymphomatous Mass about the right mandible, corresponding to the large FDG avid tumor in June. Adjacent abnormal level 1A lymph node in the midline is new or progressed since June. 2. But no other lymphadenopathy in the Neck or at the thoracic inlet. And the underlying right mandible remains intact. Electronically Signed   By: VEAR Hurst M.D.   On: 10/14/2023 13:16   ADDENDUM  .Patient was Personally and independently interviewed, examined and relevant elements of the history of present illness were reviewed in details and an assessment and plan was created. All elements of the patient's history of present illness , assessment and plan were discussed in details with Paeton Studer NP. The above documentation reflects our combined findings assessment and plan. Patient with large cell transformation of follicular lymphoma.  Biopsy of the right facial mass results are still pending. He was to start or R-CEOP as outpatient on 10/20/2023 but was admitted with chest pain and group B streptococcus sepsis.  Has been seen by infectious disease and is on IV penicillin .  He did  have elevated troponin levels.  This is thought to be demand ischemia in the context of sepsis hypotension and tachycardia but could not rule out ACS.  He is on IV heparin  and has been started on low-dose beta-blockers per cardiology/Dr. Mona. Oncology will continue to monitor peripherally for appropriate timing to start his R-CEOP once cardiac status and sepsis are under control.  If his overall systemic issues continue to delay his treatment significantly might need to consider palliative radiation for the right facial mass but this would be less desirable.  Gautam Kale MD MS

## 2023-10-20 NOTE — Progress Notes (Addendum)
 PROGRESS NOTE    Harold [name]  FMW:[id number] DOB: Jan 28, 1935 DOA: 10/18/2023 PCP: [name], MD    Brief Narrative:   Harold [name] is a 89 y.o. 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  Patient recently admitted 7/28 - 7/31 for rapidly enlarging right neck mass concerning for high-grade lymphoma.  Seen by medical oncology and underwent IR biopsy and Port-A-Cath placement on 7/30 and subsequently discharged home on prednisone  with planned initiation of chemotherapy on 8/4.  In the ED, temperature 98.9 F, HR 110, RR 20, BP 83/48.  WBC 9.5, hemoglobin 11.7, platelet count 235.  Sodium 141, potassium 3.3, chloride 101, CO2 26, glucose 99, BUN 35, creatinine 0.77.  High sensitive troponin 18>48>421.  BNP 625.0.  Lactic acid 2.6.  Urinalysis unrevealing.  Influenza/RSV/COVID PCR negative.  Blood cultures x 2 and urine culture obtained.  Chest x-ray with no acute cardiopulmonary disease process.  Ultrasound bilateral lower extremities with no evidence of DVT RLE, findings of age-indeterminate DVT left posterior tibial veins and left peroneal veins.  Patient was aggressively resuscitated with IV fluids, started on vancomycin  and cefepime  and placed on a norepinephrine  drip, heparin  drip.  Cardiology was consulted.  PCCM was consulted and patient was admitted to the intensive care unit.  Significant hospital events: 8/2: admit by PCCM to ICU, Vanc/Cefepime , U/S LE with age indeterminate DVT LLE, heparin  gtt, Cards consulted, on Levophed  drip 8/3: Blood Cx 1 out 4 (aerobic) + Streptococcus per BCID, antibiotics changed to vancomycin  and cefazolin ; titrated off Levophed , TTE completed 8/4: Transfer to SDU under TRH, antibiotics further  de-escalated to penicillin , ID consulted  Assessment & Plan:   Septic shock secondary to Streptococcus bacteremia Patient presenting to the ED with fever, weakness.  Recently diagnosed with presumed high-grade lymphoma in which she underwent recent Port-A-Cath placement and biopsy of right neck mass on 7/30.  Potential etiology for bacterial translocation.  On arrival to the ED patient was afebrile with temperature 98.9, and WBC count 9.5.  Elevated lactic acid 2.6.  Patient was aggressively resuscitated, but remained hypotensive and subsequently placed on vasopressor support.  Patient was admitted to the intensive care unit and started on IV vancomycin  and cefepime .  Blood cultures 1 out of 4 growing Streptococcus antibiotics were de-escalated to vancomycin  and cefazolin  and now to penicillin  alone.  TTE with LVEF 60 to 65%, moderate thickening of the aortic valve but no other valvular abnormalities identified.  Vasopressors were weaned off. -- Infectious disease consulted -- Blood Cx x 2 8/2: 1/4 (aerobic) + GPC's, BCID + Streptococcus; further identification/susceptibilities pending -- Repeat blood cultures 8/3: no growth <24h -- Urine culture: No growth -- Continue IV penicillin  -- Await further recommendations from ID; need for TEE, ?  Port removal  NSTEMI Patient presenting with chest pain, notably elevated troponin.  History of CAD with LHC 01/2023 60% ostial to prox RCA, 30% mid RCA, 40% prox LCx, 80% mid LAD proximal to prior LAD stent, 30% ISR of mid LAD stent , 50% dLAD and then occluded dLAD status post intracoronary lithotripsy of heavily calcified LAD with suboptimal expansion with balloon and therefore stent was not placed.  TTE with LVEF 60-65%, no regional wall motion abnormalities, grade 2 diastolic dysfunction. -- Cardiology following, appreciate assistance --  hs Troponin 18>48>421>610>735>575>539 -- Continues on heparin  drip -- Currently chest pain-free, monitor on  telemetry  Low-grade follicular lymphoma Rapidly enlarging right neck mass Concern for high-grade lymphoma Patient recently admitted for rapidly enlarging right neck mass with concern for high-grade lymphoma.  CT neck with contrast 7/29: Very large and transfacial up to 12 cm long axis lymphomatous mass about the right mandible, corresponding to the large FDG avid tumor in June.  Adjacent abnormal level 1A lymph node in the midline is new or progressed since June. S/p IR core biopsies and Port-A-Cath placement on 7/30. -- Follows with Medical Oncology, Dr. [name] -- Pathology from lymph node biopsy 7/30: pending -- CT soft tissue neck with contrast today to rule out hemorrhage within the neck mass in the setting of recent biopsy and currently on heparin  drip  Age indeterminant LLE DVT Vascular duplex ultrasound on admission notable for age-indeterminate DVT left posterior tibial veins and left peroneal veins.  Previously known history of DVT no longer on Xarelto. -- Remains on heparin  drip  Hypokalemia Repleted.  Potassium 3.6 today.  Essential hypertension Chronic diastolic congestive heart failure, compensated -- Holding home atenolol  and furosemide  due to septic shock on admission, BP improved 131/56 this morning with HR 64 -- Continue monitor BP closely -- Strict I's and O's and daily weights   Hyperlipidemia -- Continue pravastatin  40 mg PO daily   CAD s/p remote stents Denies chest pain/angina.  No longer on Brilinta . -- Continue statin, atenolol    BPH -- Continue finasteride  5 mg p.o. daily   Anemia of chronic disease Hemoglobin stable, 9.8   DVT prophylaxis: Heparin  drip    Code Status: Limited: Do not attempt resuscitation (DNR) -DNR-LIMITED -Do Not Intubate/DNI  Family Communication: No family present bedside this morning  Disposition Plan:  Level of care: Stepdown Status is: Inpatient Remains inpatient appropriate because: IV antibiotics, awaiting ID  consultation    Consultants:  PCCM: signed off 8/4 Cardiology Infectious Disease  Procedures:  TTE  Antimicrobials:  Vancomycin  8/1 - 8/3 Cefepime  8/1 - 8/2 Cefazolin  8/2 - 8/3 Penicillin  8/4>>   Subjective: Patient seen examined bedside, lying in bed.  No family present.  No specific complaints this morning other than mild jaw pain surrounding his large right sided neck/jaw mass.  Improved with Tylenol .  Wants to have some breakfast, scrambled eggs.  Antibiotics de-escalated to IV penicillin  given bacteremia with Streptococcus.  Consulted ID.  No other specific complaints or concerns at this time.  Denies headache, no vision changes, no chest pain, no palpitations, no shortness of breath, no abdominal pain, no fever/chills/night sweats, no nausea/vomiting/diarrhea, no focal weakness, no fatigue.  No acute events overnight per nurse staff.  Objective: Vitals:   10/20/23 0500 10/20/23 0600 10/20/23 0700 10/20/23 0800  BP: (!) 131/56 (!) 137/55 (!) 142/61   Pulse: 64 68 62   Resp: 13 14 11    Temp:    97.6 F (36.4 C)  TempSrc:    Oral  SpO2: 97% 98% 98%   Weight: 69.1 kg 64 kg    Height:  5' 6 (1.676 m)      Intake/Output Summary (Last 24 hours) at 10/20/2023 1005 Last data filed at 10/20/2023 0941 Gross per 24 hour  Intake 2261.61 ml  Output 975 ml  Net 1286.61 ml   Filed Weights   10/19/23 0439 10/20/23 0500 10/20/23 0600  Weight: 69.8 kg 69.1 kg 64 kg    Examination:  Physical Exam: GEN: NAD, alert and oriented x 3,  elderly/ill appearance HEENT: NCAT, PERRL, EOMI, sclera clear, MMM, large lobulated mass right neck/lower face PULM: CTAB w/o wheezes/crackles, normal respiratory effort, on room air CV: RRR w/o M/G/R GI: abd soft, NTND, NABS, no R/G/M MSK: + Bilateral lower extremity peripheral edema, moves all EXTR independently NEURO: No focal neurological deficit PSYCH: normal mood/affect Integumentary: Chronic venous changes bilateral lower extremities,  otherwise no other concerning rashes/lesions/wounds noted on exposed skin surfaces     Data Reviewed: I have personally reviewed following labs and imaging studies  CBC: Recent Labs  Lab 10/13/23 1212 10/16/23 0700 10/18/23 0205 10/19/23 0242 10/20/23 0552  WBC 5.0 4.9 9.5 12.6* 6.7  NEUTROABS 2.8  --  8.7*  --   --   HGB 11.0* 10.6* 11.7* 9.3* 9.8*  HCT 34.2* 34.6* 36.5* 30.3* 31.1*  MCV 85.7 88.3 87.3 89.6 89.1  PLT 269 260 235 199 177   Basic Metabolic Panel: Recent Labs  Lab 10/16/23 0700 10/18/23 0205 10/19/23 0242 10/19/23 1606 10/20/23 0552  NA 139 141 139 138 139  K 4.3 3.3* 3.2* 3.7 3.6  CL 101 101 108 104 104  CO2 27 26 23 27 25   GLUCOSE 157* 99 122* 98 88  BUN 22 35* 27* 22 19  CREATININE 0.72 0.77 0.85 0.94 0.81  CALCIUM 8.9 9.5 8.2* 8.3* 7.9*  MG 1.9  --  1.6* 2.4 2.3  PHOS  --   --  1.7* 2.3* 1.9*   GFR: Estimated Creatinine Clearance: 59.1 mL/min (by C-G formula based on SCr of 0.81 mg/dL). Liver Function Tests: Recent Labs  Lab 10/13/23 1212  AST 15  ALT 10  ALKPHOS 53  BILITOT 0.5  PROT 6.1*  ALBUMIN 3.6   No results for input(s): LIPASE, AMYLASE in the last 168 hours. No results for input(s): AMMONIA in the last 168 hours. Coagulation Profile: Recent Labs  Lab 10/15/23 0653 10/18/23 1826  INR 1.1 1.2   Cardiac Enzymes: No results for input(s): CKTOTAL, CKMB, CKMBINDEX, TROPONINI in the last 168 hours. BNP (last 3 results) No results for input(s): PROBNP in the last 8760 hours. HbA1C: No results for input(s): HGBA1C in the last 72 hours. CBG: No results for input(s): GLUCAP in the last 168 hours. Lipid Profile: No results for input(s): CHOL, HDL, LDLCALC, TRIG, CHOLHDL, LDLDIRECT in the last 72 hours. Thyroid  Function Tests: No results for input(s): TSH, T4TOTAL, FREET4, T3FREE, THYROIDAB in the last 72 hours. Anemia Panel: No results for input(s): VITAMINB12, FOLATE, FERRITIN,  TIBC, IRON, RETICCTPCT in the last 72 hours. Sepsis Labs: Recent Labs  Lab 10/18/23 0630 10/18/23 0748 10/18/23 0947 10/19/23 0242  PROCALCITON  --   --   --  2.63  LATICACIDVEN 13.4* 2.6* 2.2* 1.7    Recent Results (from the past 240 hours)  Blood culture (routine x 2)     Status: Abnormal (Preliminary result)   Collection Time: 10/18/23  2:05 AM   Specimen: BLOOD RIGHT ARM  Result Value Ref Range Status   Specimen Description   Final    BLOOD RIGHT ARM Performed at [facility], [address], [place], [place] [address]    Special Requests   Final    BOTTLES DRAWN AEROBIC AND ANAEROBIC Blood Culture results may not be optimal due to an inadequate volume of blood received in culture bottles Performed at [facility], [address], [place], [place] [address]    Culture  Setup Time   Final    GRAM POSITIVE COCCI IN CHAINS  AEROBIC BOTTLE ONLY CRITICAL RESULT CALLED TO, READ BACK BY AND VERIFIED WITH: PHARMD LEANNE P [id number] FCP    Culture (A)  Final    GROUP B STREP(S.AGALACTIAE)ISOLATED SUSCEPTIBILITIES TO FOLLOW Performed at [facility], [address] [place], [place] [address]    Report Status PENDING  Incomplete  Blood culture (routine x 2)     Status: None (Preliminary result)   Collection Time: 10/18/23  2:05 AM   Specimen: BLOOD LEFT FOREARM  Result Value Ref Range Status   Specimen Description   Final    BLOOD LEFT FOREARM Performed at [facility], [address], [place], [place] [address]    Special Requests   Final    BOTTLES DRAWN AEROBIC AND ANAEROBIC Blood Culture results may not be optimal due to an inadequate volume of blood received in culture bottles Performed at [facility], [address], [place], [place] [address]    Culture   Final    NO GROWTH 2 DAYS Performed at [facility], [address], [place], [place] [address]    Report Status PENDING  Incomplete  Blood  Culture ID Panel (Reflexed)     Status: Abnormal   Collection Time: 10/18/23  2:05 AM  Result Value Ref Range Status   Enterococcus faecalis NOT DETECTED NOT DETECTED Final   Enterococcus Faecium NOT DETECTED NOT DETECTED Final   Listeria monocytogenes NOT DETECTED NOT DETECTED Final   Staphylococcus species NOT DETECTED NOT DETECTED Final   Staphylococcus aureus (BCID) NOT DETECTED NOT DETECTED Final   Staphylococcus epidermidis NOT DETECTED NOT DETECTED Final   Staphylococcus lugdunensis NOT DETECTED NOT DETECTED Final   Streptococcus species DETECTED (A) NOT DETECTED Final    Comment: CRITICAL RESULT CALLED TO, READ BACK BY AND VERIFIED WITH: PHARMD LEANNE P 2241 [id number] FCP    Streptococcus agalactiae DETECTED (A) NOT DETECTED Final    Comment: CRITICAL RESULT CALLED TO, READ BACK BY AND VERIFIED WITH: PHARMD LEANNE P 2241 [id number] FCP    Streptococcus pneumoniae NOT DETECTED NOT DETECTED Final   Streptococcus pyogenes NOT DETECTED NOT DETECTED Final   A.calcoaceticus-baumannii NOT DETECTED NOT DETECTED Final   Bacteroides fragilis NOT DETECTED NOT DETECTED Final   Enterobacterales NOT DETECTED NOT DETECTED Final   Enterobacter cloacae complex NOT DETECTED NOT DETECTED Final   Escherichia coli NOT DETECTED NOT DETECTED Final   Klebsiella aerogenes NOT DETECTED NOT DETECTED Final   Klebsiella oxytoca NOT DETECTED NOT DETECTED Final   Klebsiella pneumoniae NOT DETECTED NOT DETECTED Final   Proteus species NOT DETECTED NOT DETECTED Final   Salmonella species NOT DETECTED NOT DETECTED Final   Serratia marcescens NOT DETECTED NOT DETECTED Final   Haemophilus influenzae NOT DETECTED NOT DETECTED Final   Neisseria meningitidis NOT DETECTED NOT DETECTED Final   Pseudomonas aeruginosa NOT DETECTED NOT DETECTED Final   Stenotrophomonas maltophilia NOT DETECTED NOT DETECTED Final   Candida albicans NOT DETECTED NOT DETECTED Final   Candida auris NOT DETECTED NOT DETECTED Final   Candida  glabrata NOT DETECTED NOT DETECTED Final   Candida krusei NOT DETECTED NOT DETECTED Final   Candida parapsilosis NOT DETECTED NOT DETECTED Final   Candida tropicalis NOT DETECTED NOT DETECTED Final   Cryptococcus neoformans/gattii NOT DETECTED NOT DETECTED Final    Comment: Performed at [facility], [address], [place], [place] [address]  Resp panel by RT-PCR (RSV, Flu A&B, Covid) Anterior Nasal Swab     Status: None  Collection Time: 10/18/23  2:26 AM   Specimen: Anterior Nasal Swab  Result Value Ref Range Status   SARS Coronavirus 2 by RT PCR NEGATIVE NEGATIVE Final    Comment: (NOTE) SARS-CoV-2 target nucleic acids are NOT DETECTED.  The SARS-CoV-2 RNA is generally detectable in upper respiratory specimens during the acute phase of infection. The lowest concentration of SARS-CoV-2 viral copies this assay can detect is 138 copies/mL. A negative result does not preclude SARS-Cov-2 infection and should not be used as the sole basis for treatment or other patient management decisions. A negative result may occur with  improper specimen collection/handling, submission of specimen other than nasopharyngeal swab, presence of viral mutation(s) within the areas targeted by this assay, and inadequate number of viral copies(<138 copies/mL). A negative result must be combined with clinical observations, patient history, and epidemiological information. The expected result is Negative.  Fact Sheet for Patients:  BloggerCourse.com  Fact Sheet for Healthcare Providers:  SeriousBroker.it  This test is no t yet approved or cleared by the [place]  FDA and  has been authorized for detection and/or diagnosis of SARS-CoV-2 by FDA under an Emergency Use Authorization (EUA). This EUA will remain  in effect (meaning this test can be used) for the duration of the COVID-19 declaration under Section 564(b)(1) of the Act, 21 U.S.C.section  360bbb-3(b)(1), unless the authorization is terminated  or revoked sooner.       Influenza A by PCR NEGATIVE NEGATIVE Final   Influenza B by PCR NEGATIVE NEGATIVE Final    Comment: (NOTE) The Xpert Xpress SARS-CoV-2/FLU/RSV plus assay is intended as an aid in the diagnosis of influenza from Nasopharyngeal swab specimens and should not be used as a sole basis for treatment. Nasal washings and aspirates are unacceptable for Xpert Xpress SARS-CoV-2/FLU/RSV testing.  Fact Sheet for Patients: BloggerCourse.com  Fact Sheet for Healthcare Providers: SeriousBroker.it  This test is not yet approved or cleared by the [place]  FDA and has been authorized for detection and/or diagnosis of SARS-CoV-2 by FDA under an Emergency Use Authorization (EUA). This EUA will remain in effect (meaning this test can be used) for the duration of the COVID-19 declaration under Section 564(b)(1) of the Act, 21 U.S.C. section 360bbb-3(b)(1), unless the authorization is terminated or revoked.     Resp Syncytial Virus by PCR NEGATIVE NEGATIVE Final    Comment: (NOTE) Fact Sheet for Patients: BloggerCourse.com  Fact Sheet for Healthcare Providers: SeriousBroker.it  This test is not yet approved or cleared by the [place]  FDA and has been authorized for detection and/or diagnosis of SARS-CoV-2 by FDA under an Emergency Use Authorization (EUA). This EUA will remain in effect (meaning this test can be used) for the duration of the COVID-19 declaration under Section 564(b)(1) of the Act, 21 U.S.C. section 360bbb-3(b)(1), unless the authorization is terminated or revoked.  Performed at [facility], [address], [place], [place] [address]   Urine Culture     Status: None   Collection Time: 10/18/23  6:16 AM   Specimen: Urine, Clean Catch  Result Value Ref Range Status    Specimen Description   Final    URINE, CLEAN CATCH Performed at [facility], [address], [place], [place] [address]    Special Requests   Final    NONE Performed at [facility], [address] [place], [place] [address]    Culture   Final    NO GROWTH Performed at [facility], [address] [place], [place] [address]    Report Status 10/19/2023 FINAL  Final  MRSA Next Gen by PCR, Nasal     Status: None   Collection Time: 10/18/23  9:19 AM   Specimen: Nasal Mucosa; Nasal Swab  Result Value Ref Range Status   MRSA by PCR Next Gen NOT DETECTED NOT DETECTED Final    Comment: (NOTE) The GeneXpert MRSA Assay (FDA approved for NASAL specimens only), is one component of a comprehensive MRSA colonization surveillance program. It is not intended to diagnose MRSA infection nor to guide or monitor treatment for MRSA infections. Test performance is not FDA approved in patients less than 62 years old. Performed at [facility], [address] [place], [place] [address]   Culture, blood (Routine X 2) w Reflex to ID Panel     Status: None (Preliminary result)   Collection Time: 10/19/23  2:42 AM   Specimen: BLOOD LEFT HAND  Result Value Ref Range Status   Specimen Description BLOOD LEFT HAND  Final   Special Requests   Final    BOTTLES DRAWN AEROBIC ONLY Blood Culture results may not be optimal due to an inadequate volume of blood received in culture bottles   Culture   Final    NO GROWTH < 24 HOURS Performed at [facility], [address] [place], [place] [address]    Report Status PENDING  Incomplete  Culture, blood (Routine X 2) w Reflex to ID Panel     Status: None (Preliminary result)   Collection Time: 10/19/23  2:42 AM   Specimen: BLOOD LEFT ARM  Result Value Ref Range Status   Specimen Description BLOOD LEFT ARM  Final   Special Requests   Final    BOTTLES DRAWN AEROBIC ONLY Blood Culture results  may not be optimal due to an inadequate volume of blood received in culture bottles   Culture   Final    NO GROWTH < 24 HOURS Performed at [facility], [address], [place], [place] [address]    Report Status PENDING  Incomplete         Radiology Studies: VAS US  LOWER EXTREMITY VENOUS (DVT) Result Date: 10/19/2023  Lower Venous DVT Study Patient Name:  AUTUMN GUNN  Date of Exam:   10/18/2023 Medical Rec #: [id number]       Accession #:    [id number] Date of Birth: [birth year]       Patient Gender: M Patient Age:   17 years Exam Location:  [facility] Procedure:      VAS US  LOWER EXTREMITY VENOUS (DVT) Referring Phys: LAMAR CHRIS --------------------------------------------------------------------------------  Indications: Right > L Edema.  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 Chronic HFpEF. Limitations: Pitting edema, involuntary patient movement. Comparison Study: Prior bilateral LEV done 05/05/20 indicating chronic left                   peroneal DVT Performing Technologist: Alberta Lis RVS  Examination Guidelines: A complete evaluation includes B-mode imaging, spectral Doppler, color Doppler, and power Doppler as needed of all accessible portions of each vessel. Bilateral testing is considered an integral part of a complete examination. Limited examinations for reoccurring indications may be performed as noted. The reflux portion of the exam is performed with the patient in reverse Trendelenburg.  +---------+---------------+---------+-----------+----------+--------------+ RIGHT    CompressibilityPhasicitySpontaneityPropertiesThrombus Aging +---------+---------------+---------+-----------+----------+--------------+ CFV      Full  Yes      No                                  +---------+---------------+---------+-----------+----------+--------------+ SFJ      Full                                                         +---------+---------------+---------+-----------+----------+--------------+ FV Prox  Full           Yes      No                                  +---------+---------------+---------+-----------+----------+--------------+ FV Mid   Full                                                        +---------+---------------+---------+-----------+----------+--------------+ FV DistalFull                                                        +---------+---------------+---------+-----------+----------+--------------+ PFV      Full           Yes      No                                  +---------+---------------+---------+-----------+----------+--------------+ POP      Full           Yes      No                                  +---------+---------------+---------+-----------+----------+--------------+ PTV      Full                                                        +---------+---------------+---------+-----------+----------+--------------+ PERO     Full                                                        +---------+---------------+---------+-----------+----------+--------------+   +---------+---------------+---------+-----------+----------+-------------------+ LEFT     CompressibilityPhasicitySpontaneityPropertiesThrombus Aging      +---------+---------------+---------+-----------+----------+-------------------+ CFV      Full           Yes      No                                       +---------+---------------+---------+-----------+----------+-------------------+ SFJ  Full                                                             +---------+---------------+---------+-----------+----------+-------------------+ FV Prox  Full           Yes      No                                       +---------+---------------+---------+-----------+----------+-------------------+ FV Mid   Full                                                              +---------+---------------+---------+-----------+----------+-------------------+ FV DistalFull           Yes      No                                       +---------+---------------+---------+-----------+----------+-------------------+ PFV      Full                                                             +---------+---------------+---------+-----------+----------+-------------------+ POP                     Yes      No                   patent by color and                                                       Doppler             +---------+---------------+---------+-----------+----------+-------------------+ PTV      None                                         Age Indeterminate   +---------+---------------+---------+-----------+----------+-------------------+ PERO     None                                         Age Indeterminate   +---------+---------------+---------+-----------+----------+-------------------+     Summary: RIGHT: - There is no evidence of deep vein thrombosis in the lower extremity.  Interstitial edema noted throughout the extremity  LEFT: - Findings consistent with age indeterminate deep vein thrombosis involving the left posterior tibial veins, and left peroneal veins.  Interstitial edema noted throughout the extremity.  *See table(s) above for measurements and observations. Electronically signed  by Debby Robertson on 10/19/2023 at 3:36:23 PM.    Final    ECHOCARDIOGRAM COMPLETE Result Date: 10/19/2023    ECHOCARDIOGRAM REPORT   Patient Name:   KALUP JAQUITH Date of Exam: 10/19/2023 Medical Rec #:  [id number]      Height:       66.0 in Accession #:    [id number]     Weight:       153.9 lb Date of Birth:  [birth year]      BSA:          1.789 m Patient Age:    88 years       BP:           111/48 mmHg Patient Gender: M              HR:           69 bpm. Exam Location:  Inpatient Procedure: 2D Echo, Cardiac  Doppler, Color Doppler, Strain Analysis and 3D Echo            (Both Spectral and Color Flow Doppler were utilized during            procedure). Indications:    Shock  History:        Patient has prior history of Echocardiogram examinations, most                 recent 10/15/2023. CAD and Previous Myocardial Infarction, Mitral                 Valve Disease and Aortic Valve Disease; Risk                 Factors:Hypertension and Dyslipidemia.  Sonographer:    Therisa Crouch Referring Phys: 5 ROBERT S BYRUM IMPRESSIONS  1. No new RWMA;s since TTE done 10/15/23. Left ventricular ejection fraction, by estimation, is 60 to 65%. The left ventricle has normal function. The left ventricle has no regional wall motion abnormalities. Left ventricular diastolic parameters are consistent with Grade II diastolic dysfunction (pseudonormalization). Elevated left ventricular end-diastolic pressure. The average left ventricular global longitudinal strain is -19.9 %. The global longitudinal strain is normal.  2. Right ventricular systolic function is normal. The right ventricular size is normal.  3. Right atrial size was mildly dilated.  4. The mitral valve is degenerative. Trivial mitral valve regurgitation. Mild mitral stenosis. Moderate mitral annular calcification.  5. Tricuspid valve regurgitation is moderate.  6. The aortic valve is tricuspid. There is moderate calcification of the aortic valve. There is moderate thickening of the aortic valve. Aortic valve regurgitation is not visualized. Mild to moderate aortic valve stenosis.  7. The inferior vena cava is normal in size with greater than 50% respiratory variability, suggesting right atrial pressure of 3 mmHg. FINDINGS  Left Ventricle: No new RWMA;s since TTE done 10/15/23. Left ventricular ejection fraction, by estimation, is 60 to 65%. The left ventricle has normal function. The left ventricle has no regional wall motion abnormalities. The average left ventricular global  longitudinal strain is -19.9 %. Strain was performed and the global longitudinal strain is normal. The left ventricular internal cavity size was normal in size. There is no left ventricular hypertrophy. Left ventricular diastolic parameters are consistent with Grade II diastolic dysfunction (pseudonormalization). Elevated left ventricular end-diastolic pressure. Right Ventricle: The right ventricular size is normal. No increase in right ventricular wall thickness. Right ventricular systolic function is normal. Left Atrium: Left atrial size was normal in size. Right Atrium: Right atrial  size was mildly dilated. Pericardium: There is no evidence of pericardial effusion. Mitral Valve: The mitral valve is degenerative in appearance. Moderate mitral annular calcification. Trivial mitral valve regurgitation. Mild mitral valve stenosis. MV peak gradient, 8.9 mmHg. The mean mitral valve gradient is 5.0 mmHg. Tricuspid Valve: The tricuspid valve is normal in structure. Tricuspid valve regurgitation is moderate . No evidence of tricuspid stenosis. Aortic Valve: The aortic valve is tricuspid. There is moderate calcification of the aortic valve. There is moderate thickening of the aortic valve. Aortic valve regurgitation is not visualized. Mild to moderate aortic stenosis is present. Aortic valve mean gradient measures 11.7 mmHg. Aortic valve peak gradient measures 20.4 mmHg. Aortic valve area, by VTI measures 1.32 cm. Pulmonic Valve: The pulmonic valve was normal in structure. Pulmonic valve regurgitation is trivial. No evidence of pulmonic stenosis. Aorta: The aortic root is normal in size and structure. Venous: The inferior vena cava is normal in size with greater than 50% respiratory variability, suggesting right atrial pressure of 3 mmHg. IAS/Shunts: No atrial level shunt detected by color flow Doppler. Additional Comments: 3D was performed not requiring image post processing on an independent workstation and was  indeterminate.  LEFT VENTRICLE PLAX 2D LVIDd:         3.80 cm   Diastology LVIDs:         2.50 cm   LV e' medial:    6.64 cm/s LV PW:         0.90 cm   LV E/e' medial:  17.3 LV IVS:        0.90 cm   LV e' lateral:   7.83 cm/s LVOT diam:     2.00 cm   LV E/e' lateral: 14.7 LV SV:         66 LV SV Index:   37        2D Longitudinal Strain LVOT Area:     3.14 cm  2D Strain GLS Avg:     -19.9 %                           3D Volume EF                          LV 3D EDV:   103.42 ml                          LV 3D ESV:   39.24 ml RIGHT VENTRICLE             IVC RV Basal diam:  3.60 cm     IVC diam: 3.00 cm RV S prime:     13.30 cm/s TAPSE (M-mode): 2.4 cm LEFT ATRIUM             Index        RIGHT ATRIUM           Index LA diam:        3.60 cm 2.01 cm/m   RA Area:     20.60 cm LA Vol (A2C):   77.6 ml 43.38 ml/m  RA Volume:   55.40 ml  30.97 ml/m LA Vol (A4C):   75.6 ml 42.26 ml/m LA Biplane Vol: 77.3 ml 43.21 ml/m  AORTIC VALVE AV Area (Vmax):    1.27 cm AV Area (Vmean):   1.23 cm AV Area (VTI):     1.32 cm AV Vmax:  226.00 cm/s AV Vmean:          158.333 cm/s AV VTI:            0.505 m AV Peak Grad:      20.4 mmHg AV Mean Grad:      11.7 mmHg LVOT Vmax:         91.05 cm/s LVOT Vmean:        62.100 cm/s LVOT VTI:          0.212 m LVOT/AV VTI ratio: 0.42  AORTA Ao Root diam: 3.50 cm Ao Asc diam:  3.20 cm MITRAL VALVE                TRICUSPID VALVE MV Area (PHT): 3.17 cm     TR Peak grad:   34.8 mmHg MV Area VTI:   1.32 cm     TR Vmax:        295.00 cm/s MV Peak grad:  8.9 mmHg MV Mean grad:  5.0 mmHg     SHUNTS MV Vmax:       1.49 m/s     Systemic VTI:  0.21 m MV Vmean:      106.5 cm/s   Systemic Diam: 2.00 cm MV Decel Time: 239 msec MV E velocity: 115.00 cm/s MV A velocity: 130.00 cm/s MV E/A ratio:  0.88 Maude Emmer MD Electronically signed by Maude Emmer MD Signature Date/Time: 10/19/2023/10:56:55 AM    Final         Scheduled Meds:  acyclovir   400 mg Oral Daily   allopurinol   100 mg Oral BID    aspirin  EC  81 mg Oral Daily   brimonidine   1 drop Right Eye BID   Chlorhexidine  Gluconate Cloth  6 each Topical Daily   cholecalciferol   1,000 Units Oral Daily   cyanocobalamin   2,500 mcg Oral Daily   finasteride   5 mg Oral Daily   pantoprazole   40 mg Oral Daily   pravastatin   40 mg Oral QHS   sodium chloride  flush  10-40 mL Intracatheter Q12H   Continuous Infusions:  heparin  1,400 Units/hr (10/20/23 0703)   penicillin  G potassium 24 Million Units in dextrose  5 % 500 mL CONTINUOUS infusion     potassium PHOSPHATE  IVPB (in mmol) 30 mmol (10/20/23 0942)     LOS: 2 days    Time spent: 52 minutes spent on 10/20/2023 caring for this patient face-to-face including chart review, ordering labs/tests, documenting, discussion with nursing staff, consultants, updating family and interview/physical exam    Camellia PARAS [place], DO [facility] Available via Epic secure chat 7am-7pm After these hours, please refer to coverage provider listed on [email or web address] 10/20/2023, 10:05 AM

## 2023-10-20 NOTE — Progress Notes (Signed)
 PHARMACY - ANTICOAGULATION CONSULT NOTE  Pharmacy Consult for Heparin  Indication: chest pain/ACS  No Known Allergies  Patient Measurements: Height: 5' 6 (167.6 cm) Weight: 69.8 kg (153 lb 14.1 oz) IBW/kg (Calculated) : 63.8 HEPARIN  DW (KG): 70.3  Vital Signs: Temp: 98.1 F (36.7 C) (08/03 2055) Temp Source: Oral (08/03 2055) BP: 109/43 (08/03 1900) Pulse Rate: 70 (08/03 1900)  Labs: Recent Labs    10/18/23 0205 10/18/23 0409 10/18/23 1543 10/18/23 1826 10/18/23 1929 10/18/23 2335 10/19/23 0242 10/19/23 1247 10/19/23 1606 10/20/23 0014  HGB 11.7*  --   --   --   --   --  9.3*  --   --   --   HCT 36.5*  --   --   --   --   --  30.3*  --   --   --   PLT 235  --   --   --   --   --  199  --   --   --   APTT  --   --   --  31  --   --   --   --   --   --   LABPROT  --   --   --  16.1*  --   --   --   --   --   --   INR  --   --   --  1.2  --   --   --   --   --   --   HEPARINUNFRC  --   --   --   --   --   --  <0.10* 0.17*  --  0.21*  CREATININE 0.77  --   --   --   --   --  0.85  --  0.94  --   TROPONINIHS 18*   < > 735*  --  575* 539*  --   --   --   --    < > = values in this interval not displayed.    Estimated Creatinine Clearance: 50.9 mL/min (by C-G formula based on SCr of 0.94 mg/dL).   Medical History: Past Medical History:  Diagnosis Date   BPH (benign prostatic hypertrophy)    CAD (coronary artery disease) 01/2010   S/P drug eluting stents LAD and PL branch of RCA Dr. 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  Evaluated today by cardiology for chest pain and increasingly elevated troponins.   10/19/23 Heparin  level 0.21 (subtherapeutic but increasing) with heparin  infusion at 1250 units/hr Dopplers + age indeterminate LLE DVT No complications  of therapy noted  Goal of Therapy:  Heparin  level 0.3-0.7 units/ml Monitor platelets by anticoagulation protocol: Yes   Plan:  -Give heparin  1000 unit IV bolus -Increase heparin  gtt to 1400 units/hr  -Check heparin  level 8 hr after rate increase -Monitor daily heparin  level when titrated to goal, CBC, signs/symptoms of bleeding   Thank you for allowing pharmacy to be a part of this patient's care.  Kemp Arvin Fletcher, PharmD Clinical Pharmacist 10/20/2023 12:59 AM

## 2023-10-20 NOTE — Progress Notes (Signed)
 Progress Note  Patient Name: Harold [name] Date of Encounter: 10/20/2023  [facility] Cardiologist: Lonni Cash, MD    Subjective   No chest pain. Noted to be hypertensive today. Troponin peaked at 735 > 575 > 539. On IV heparin . Echo yesterday shows LVEF 60-65%, no neew regional WMA's compared to study on 10/15/23. Also noted to have age-inderminate DVT of the left posterior tibial vein and left peroneal deep veins.  Inpatient Medications    Scheduled Meds:  acyclovir   400 mg Oral Daily   allopurinol   100 mg Oral BID   aspirin  EC  81 mg Oral Daily   brimonidine   1 drop Right Eye BID   Chlorhexidine  Gluconate Cloth  6 each Topical Daily   cholecalciferol   1,000 Units Oral Daily   cyanocobalamin   2,500 mcg Oral Daily   finasteride   5 mg Oral Daily   pantoprazole   40 mg Oral Daily   pravastatin   40 mg Oral QHS   sodium chloride  flush  10-40 mL Intracatheter Q12H   Continuous Infusions:  heparin  1,400 Units/hr (10/20/23 1151)   penicillin  G potassium 24 Million Units in dextrose  5 % 500 mL CONTINUOUS infusion 24 Million Units (10/20/23 1058)   potassium PHOSPHATE  IVPB (in mmol) 30 mmol (10/20/23 0942)   PRN Meds: acetaminophen , docusate sodium , gabapentin , ondansetron , mouth rinse, polyethylene glycol, sodium chloride  flush, traMADol    Vital Signs    Vitals:   10/20/23 0500 10/20/23 0600 10/20/23 0700 10/20/23 0800  BP: (!) 131/56 (!) 137/55 (!) 142/61   Pulse: 64 68 62   Resp: 13 14 11    Temp:    97.6 F (36.4 C)  TempSrc:    Oral  SpO2: 97% 98% 98%   Weight: 69.1 kg 64 kg    Height:  5' 6 (1.676 m)      Intake/Output Summary (Last 24 hours) at 10/20/2023 1209 Last data filed at 10/20/2023 0941 Gross per 24 hour  Intake 2261.61 ml  Output 975 ml  Net 1286.61 ml      10/20/2023    6:00 AM 10/20/2023    5:00 AM 10/19/2023    4:39 AM  Last 3 Weights  Weight (lbs) 141 lb 1.5 oz 152 lb 5.4 oz 153 lb 14.1 oz  Weight (kg) 64 kg 69.1 kg 69.8 kg       Telemetry    Normal sinus rhythm- Personally Reviewed  ECG    N/A  Physical Exam   General appearance: alert and no distress Neck: no carotid bruit, no JVD, thyroid  not enlarged, symmetric, no tenderness/mass/nodules, and large assymetric right facial swelling Lungs: clear to auscultation bilaterally Heart: regular rate and rhythm Abdomen: soft, non-tender; bowel sounds normal; no masses,  no organomegaly Extremities: edema 2+ bilateral LE Pulses: 2+ and symmetric Skin: Skin color, texture, turgor normal. No rashes or lesions Neurologic: Grossly normal Psych: Pleasant, up eating in bed    Labs    High Sensitivity Troponin:   Recent Labs  Lab 10/18/23 0947 10/18/23 1128 10/18/23 1543 10/18/23 1929 10/18/23 2335  TROPONINIHS 421* 610* 735* 575* 539*      Chemistry Recent Labs  Lab 10/13/23 1212 10/16/23 0700 10/19/23 0242 10/19/23 1606 10/20/23 0552  NA 143   < > 139 138 139  K 3.6   < > 3.2* 3.7 3.6  CL 103   < > 108 104 104  CO2 35*   < > 23 27 25   GLUCOSE 85   < > 122* 98 88  BUN 22   < > 27* 22 19  CREATININE 1.00   < > 0.85 0.94 0.81  CALCIUM 8.9   < > 8.2* 8.3* 7.9*  PROT 6.1*  --   --   --   --   ALBUMIN 3.6  --   --   --   --   AST 15  --   --   --   --   ALT 10  --   --   --   --   ALKPHOS 53  --   --   --   --   BILITOT 0.5  --   --   --   --   GFRNONAA >60   < > >60 >60 >60  ANIONGAP 5   < > 8 7 10    < > = values in this interval not displayed.     Hematology Recent Labs  Lab 10/18/23 0205 10/19/23 0242 10/20/23 0552  WBC 9.5 12.6* 6.7  RBC 4.18* 3.38* 3.49*  HGB 11.7* 9.3* 9.8*  HCT 36.5* 30.3* 31.1*  MCV 87.3 89.6 89.1  MCH 28.0 27.5 28.1  MCHC 32.1 30.7 31.5  RDW 14.7 15.1 15.1  PLT 235 199 177    BNP Recent Labs  Lab 10/18/23 0947  BNP 625.0*     DDimer No results for input(s): DDIMER in the last 168 hours.  No results found for: LIPOA    Radiology    VAS US  LOWER EXTREMITY VENOUS (DVT) Result Date:  10/19/2023  Lower Venous DVT Study Patient Name:  Harold [name]  Date of Exam:   10/18/2023 Medical Rec #: [id number]       Accession #:    [id number] Date of Birth: 01-Jul-1934       Patient Gender: M Patient Age:   89 years Exam Location:  [facility] Procedure:      VAS US  LOWER EXTREMITY VENOUS (DVT) Referring Phys: LAMAR CHRIS --------------------------------------------------------------------------------  Indications: Right > L Edema.  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 Chronic HFpEF. Limitations: Pitting edema, involuntary patient movement. Comparison Study: Prior bilateral LEV done 05/05/20 indicating chronic left                   peroneal DVT Performing Technologist: Alberta Lis RVS  Examination Guidelines: A complete evaluation includes B-mode imaging, spectral Doppler, color Doppler, and power Doppler as needed of all accessible portions of each vessel. Bilateral testing is considered an integral part of a complete examination. Limited examinations for reoccurring indications may be performed as noted. The reflux portion of the exam is performed with the patient in reverse Trendelenburg.  +---------+---------------+---------+-----------+----------+--------------+ RIGHT    CompressibilityPhasicitySpontaneityPropertiesThrombus Aging +---------+---------------+---------+-----------+----------+--------------+ CFV      Full           Yes      No                                  +---------+---------------+---------+-----------+----------+--------------+ SFJ      Full                                                        +---------+---------------+---------+-----------+----------+--------------+ FV Prox  Full  Yes      No                                  +---------+---------------+---------+-----------+----------+--------------+ FV Mid   Full                                                         +---------+---------------+---------+-----------+----------+--------------+ FV DistalFull                                                        +---------+---------------+---------+-----------+----------+--------------+ PFV      Full           Yes      No                                  +---------+---------------+---------+-----------+----------+--------------+ POP      Full           Yes      No                                  +---------+---------------+---------+-----------+----------+--------------+ PTV      Full                                                        +---------+---------------+---------+-----------+----------+--------------+ PERO     Full                                                        +---------+---------------+---------+-----------+----------+--------------+   +---------+---------------+---------+-----------+----------+-------------------+ LEFT     CompressibilityPhasicitySpontaneityPropertiesThrombus Aging      +---------+---------------+---------+-----------+----------+-------------------+ CFV      Full           Yes      No                                       +---------+---------------+---------+-----------+----------+-------------------+ SFJ      Full                                                             +---------+---------------+---------+-----------+----------+-------------------+ FV Prox  Full           Yes      No                                       +---------+---------------+---------+-----------+----------+-------------------+  FV Mid   Full                                                             +---------+---------------+---------+-----------+----------+-------------------+ FV DistalFull           Yes      No                                       +---------+---------------+---------+-----------+----------+-------------------+ PFV      Full                                                              +---------+---------------+---------+-----------+----------+-------------------+ POP                     Yes      No                   patent by color and                                                       Doppler             +---------+---------------+---------+-----------+----------+-------------------+ PTV      None                                         Age Indeterminate   +---------+---------------+---------+-----------+----------+-------------------+ PERO     None                                         Age Indeterminate   +---------+---------------+---------+-----------+----------+-------------------+     Summary: RIGHT: - There is no evidence of deep vein thrombosis in the lower extremity.  Interstitial edema noted throughout the extremity  LEFT: - Findings consistent with age indeterminate deep vein thrombosis involving the left posterior tibial veins, and left peroneal veins.  Interstitial edema noted throughout the extremity.  *See table(s) above for measurements and observations. Electronically signed by Debby Robertson on 10/19/2023 at 3:36:23 PM.    Final    ECHOCARDIOGRAM COMPLETE Result Date: 10/19/2023    ECHOCARDIOGRAM REPORT   Patient Name:   ESEQUIEL KLEINFELTER Date of Exam: 10/19/2023 Medical Rec #:  [id number]      Height:       66.0 in Accession #:    [id number]     Weight:       153.9 lb Date of Birth:  11-14-1934      BSA:          1.789 m Patient Age:    88 years       BP:  111/48 mmHg Patient Gender: M              HR:           69 bpm. Exam Location:  Inpatient Procedure: 2D Echo, Cardiac Doppler, Color Doppler, Strain Analysis and 3D Echo            (Both Spectral and Color Flow Doppler were utilized during            procedure). Indications:    Shock  History:        Patient has prior history of Echocardiogram examinations, most                 recent 10/15/2023. CAD and Previous Myocardial Infarction, Mitral                  Valve Disease and Aortic Valve Disease; Risk                 Factors:Hypertension and Dyslipidemia.  Sonographer:    Therisa Crouch Referring Phys: 60 ROBERT S BYRUM IMPRESSIONS  1. No new RWMA;s since TTE done 10/15/23. Left ventricular ejection fraction, by estimation, is 60 to 65%. The left ventricle has normal function. The left ventricle has no regional wall motion abnormalities. Left ventricular diastolic parameters are consistent with Grade II diastolic dysfunction (pseudonormalization). Elevated left ventricular end-diastolic pressure. The average left ventricular global longitudinal strain is -19.9 %. The global longitudinal strain is normal.  2. Right ventricular systolic function is normal. The right ventricular size is normal.  3. Right atrial size was mildly dilated.  4. The mitral valve is degenerative. Trivial mitral valve regurgitation. Mild mitral stenosis. Moderate mitral annular calcification.  5. Tricuspid valve regurgitation is moderate.  6. The aortic valve is tricuspid. There is moderate calcification of the aortic valve. There is moderate thickening of the aortic valve. Aortic valve regurgitation is not visualized. Mild to moderate aortic valve stenosis.  7. The inferior vena cava is normal in size with greater than 50% respiratory variability, suggesting right atrial pressure of 3 mmHg. FINDINGS  Left Ventricle: No new RWMA;s since TTE done 10/15/23. Left ventricular ejection fraction, by estimation, is 60 to 65%. The left ventricle has normal function. The left ventricle has no regional wall motion abnormalities. The average left ventricular global longitudinal strain is -19.9 %. Strain was performed and the global longitudinal strain is normal. The left ventricular internal cavity size was normal in size. There is no left ventricular hypertrophy. Left ventricular diastolic parameters are consistent with Grade II diastolic dysfunction (pseudonormalization). Elevated left  ventricular end-diastolic pressure. Right Ventricle: The right ventricular size is normal. No increase in right ventricular wall thickness. Right ventricular systolic function is normal. Left Atrium: Left atrial size was normal in size. Right Atrium: Right atrial size was mildly dilated. Pericardium: There is no evidence of pericardial effusion. Mitral Valve: The mitral valve is degenerative in appearance. Moderate mitral annular calcification. Trivial mitral valve regurgitation. Mild mitral valve stenosis. MV peak gradient, 8.9 mmHg. The mean mitral valve gradient is 5.0 mmHg. Tricuspid Valve: The tricuspid valve is normal in structure. Tricuspid valve regurgitation is moderate . No evidence of tricuspid stenosis. Aortic Valve: The aortic valve is tricuspid. There is moderate calcification of the aortic valve. There is moderate thickening of the aortic valve. Aortic valve regurgitation is not visualized. Mild to moderate aortic stenosis is present. Aortic valve mean gradient measures 11.7 mmHg. Aortic valve peak gradient measures 20.4 mmHg. Aortic valve area, by VTI measures  1.32 cm. Pulmonic Valve: The pulmonic valve was normal in structure. Pulmonic valve regurgitation is trivial. No evidence of pulmonic stenosis. Aorta: The aortic root is normal in size and structure. Venous: The inferior vena cava is normal in size with greater than 50% respiratory variability, suggesting right atrial pressure of 3 mmHg. IAS/Shunts: No atrial level shunt detected by color flow Doppler. Additional Comments: 3D was performed not requiring image post processing on an independent workstation and was indeterminate.  LEFT VENTRICLE PLAX 2D LVIDd:         3.80 cm   Diastology LVIDs:         2.50 cm   LV e' medial:    6.64 cm/s LV PW:         0.90 cm   LV E/e' medial:  17.3 LV IVS:        0.90 cm   LV e' lateral:   7.83 cm/s LVOT diam:     2.00 cm   LV E/e' lateral: 14.7 LV SV:         66 LV SV Index:   37        2D Longitudinal  Strain LVOT Area:     3.14 cm  2D Strain GLS Avg:     -19.9 %                           3D Volume EF                          LV 3D EDV:   103.42 ml                          LV 3D ESV:   39.24 ml RIGHT VENTRICLE             IVC RV Basal diam:  3.60 cm     IVC diam: 3.00 cm RV S prime:     13.30 cm/s TAPSE (M-mode): 2.4 cm LEFT ATRIUM             Index        RIGHT ATRIUM           Index LA diam:        3.60 cm 2.01 cm/m   RA Area:     20.60 cm LA Vol (A2C):   77.6 ml 43.38 ml/m  RA Volume:   55.40 ml  30.97 ml/m LA Vol (A4C):   75.6 ml 42.26 ml/m LA Biplane Vol: 77.3 ml 43.21 ml/m  AORTIC VALVE AV Area (Vmax):    1.27 cm AV Area (Vmean):   1.23 cm AV Area (VTI):     1.32 cm AV Vmax:           226.00 cm/s AV Vmean:          158.333 cm/s AV VTI:            0.505 m AV Peak Grad:      20.4 mmHg AV Mean Grad:      11.7 mmHg LVOT Vmax:         91.05 cm/s LVOT Vmean:        62.100 cm/s LVOT VTI:          0.212 m LVOT/AV VTI ratio: 0.42  AORTA Ao Root diam: 3.50 cm Ao Asc diam:  3.20 cm MITRAL VALVE  TRICUSPID VALVE MV Area (PHT): 3.17 cm     TR Peak grad:   34.8 mmHg MV Area VTI:   1.32 cm     TR Vmax:        295.00 cm/s MV Peak grad:  8.9 mmHg MV Mean grad:  5.0 mmHg     SHUNTS MV Vmax:       1.49 m/s     Systemic VTI:  0.21 m MV Vmean:      106.5 cm/s   Systemic Diam: 2.00 cm MV Decel Time: 239 msec MV E velocity: 115.00 cm/s MV A velocity: 130.00 cm/s MV E/A ratio:  0.88 Maude Emmer MD Electronically signed by Maude Emmer MD Signature Date/Time: 10/19/2023/10:56:55 AM    Final     Patient Profile     89 y.o. male with a hx of hypertension, CAD status post PCI of LAD  and RPLA, DVT, follicular lymphoma, hypertension and hyperlipidemia who is being seen for the evaluation of chest pain at the request of Lamar Chris, MD.   Assessment & Plan    #ASCAD #Elevated troponin #Chest pain - He has a history of CAD with cath 01/29/2023: 60% ostial to prox RCA, 30% mid RCA, 40% prox LCx, 80%  mid LAD proximal to prior LAD stent, 30% ISR of mid LAD stent , 50% dLAD and then occluded dLAD status post intracoronary lithotripsy of heavily calcified LAD with suboptimal expansion with balloon and therefore stent was not placed - 2D echo 10/14/2023 for workup for initiation of chemotherapy for NHL showed normal LV function - repeat echo shows no changes - Suspect this is demand ischemia in the setting of sepsis, hypotension and tachycardia as well as no significant CAD with recent  failed PCI of an occluded distal LAD but given trop trend could represent an ACS - High-sensitivity troponin initially 18 on admission And peaked at 735 and trending downward - He has not had any further chest pain or shortness of breath - Medical management of CAD recommended - BP improved, now hypertensive - Restart atenolol  at lower  12.5 mg daily - Continue pravastatin  40 mg daily - IV Heparin  gtts - with finding of age-indeterminate DVT, would ultimately transition to DOAC   #Septic shock - Currently on vasopressors with Levophed  - Blood cultures positive for group B strep - Since his admission he has developed bilateral lower extremity erythema and swelling of his legs consistent with cellulitis - WBC up to 12.6 today - Chest x-ray is normal and UA clear - Further workup per critical care   #Non-Hodgkin's lymphoma - 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  #LLE DVT  - noted to have DVT of the LLE, continue heparin  with transition to DOAC  Hypokalemia - Potassium 3.6 today - Repleting per CCM to keep greater than 4 - Magnesium   now 2.3  For questions or updates, please contact [facility] Please consult [email or web address] for contact info under   Vinie KYM Maxcy, MD, [facility], FNLA, [facility]  [facility]  Medical Director of the [facility]  [facility] Diplomate of the American Board of Clinical Lipidology Attending Cardiologist  Direct Dial: [phone]  Fax: [phone]  Website:  [email or web address]  Vinie JAYSON Maxcy, MD  10/20/2023, 12:09 PM

## 2023-10-20 NOTE — Consult Note (Signed)
 NAME: Harold [name]  DOB: March 28, 1934  MRN: [id number]  Date/Time: 10/20/2023 9:32 AM  REQUESTING PROVIDER: Dr.Austria Subjective:  REASON FOR CONSULT: Group B streptococcus bacteremia ? Harold [name] is a 89 y.o. 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 As per EMS note they were called by the daughter as he was having chills and feeling unwell- earlier in the day he was fine playing with his grandkids- When EMS asked him to walk he couldn't and was c/o pain rt leg while bending While they were bringing him to the hospital he complained of chest pain which he did nto have earlier on  Pt was recently discharged 10/13/23-10/16/23 after being sent to the  hospital by oncology  for a rapidly growing rt facial mass with concern for high grade lymphoma. ENT did not do biopsy , but IR did trucut biopses in the submental area and also placed a port on 10/15/23. During that hospitalization he did not have any fever and no blood culture sent Last chemo was 09/15/23 and  09/16/23 benadmustine, steroids, rituximab  Last PET scan 08/25/23  recurrent lymphoma with a large hypermetabolic subcutaneous mass in the right face, multiple hypermetabolic and enlarged lymph nodes in the neck, chest and abdomen ([place] 5), and a possible splenic and osseous involvement. 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  Temp 98.9 F (37.2 C)  Pulse Rate 109 !  Resp 17  SpO2 100 %    Latest Reference Range & Units 10/18/23 02:05  WBC 4.0 - 10.5 K/uL 9.5  Hemoglobin 13.0 - 17.0 g/dL 88.2 (L)  HCT 60.9 - 47.9 % 36.5 (L)  Platelets 150 - 400 K/uL 235  Creatinine 0.61 - 1.24 mg/dL 9.22    EKG sinus tachycardia and borderline ST changes anterior lead CXR N Blood culture sent Started on cefepime  and vanco and admitted to ICU for sepsis /shock Troponin increase, heparin  infusion , cardiology saw him, demand ischemia questioned  I am asked to see patient as blood culture is positive for Group B streptococcus ' Pt states that the rt side of his face is very tight and this morning he was unable to open his mouth Very painful No pain at the port site Though there is bleeding due to heparin  infusion He states the rt leg is not hurting any more  Past Medical History:  Diagnosis Date   BPH (benign prostatic hypertrophy)    CAD (coronary artery disease) 01/2010   S/P drug eluting stents LAD and PL branch of [facility] Dr. 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   follicular lymphoma dx'd 06/2017   Hernia    HLD (hyperlipidemia)    HTN (hypertension)     Past Surgical History:  Procedure Laterality Date   BACK SURGERY     CORONARY ANGIOGRAPHY N/A 01/29/2023   Procedure: CORONARY ANGIOGRAPHY;  Surgeon: Verlin Lonni BIRCH, MD;  Location: [facility];  Service: Cardiovascular;  Laterality: N/A;   CORONARY BALLOON ANGIOPLASTY N/A 01/29/2023   Procedure: CORONARY BALLOON ANGIOPLASTY;  Surgeon: Verlin Lonni  D, MD;  Location: [facility];  Service: Cardiovascular;  Laterality: N/A;   CORONARY LITHOTRIPSY N/A 01/29/2023   Procedure: CORONARY LITHOTRIPSY;  Surgeon: Verlin Lonni BIRCH, MD;  Location: [facility];  Service: Cardiovascular;  Laterality: N/A;   CYSTOSCOPY WITH LITHOLAPAXY N/A 05/20/2019   Procedure: CYSTOSCOPY WITH LITHOLAPAXY;  Surgeon: Cam Morene ORN, MD;  Location: [facility];  Service: Urology;  Laterality: N/A;   CYSTOSCOPY/URETEROSCOPY/HOLMIUM LASER/STENT PLACEMENT Left 05/20/2019   Procedure: CYSTOSCOPY/URETEROSCOPY/HOLMIUM LASER/STENT PLACEMENT;  Surgeon: Cam Morene ORN, MD;   Location: [facility];  Service: Urology;  Laterality: Left;   HERNIA REPAIR  01/08/11   RIH   IR IMAGING GUIDED PORT INSERTION  10/15/2023   IR US  GUIDE BX ASP/DRAIN  10/15/2023   MASS EXCISION Right 08/05/2017   Procedure: EXCISION RIGHT POSTERIOR  NECK LYMPH NODE;  Surgeon: Ethyl Lonni BRAVO, MD;  Location: [facility];  Service: ENT;  Laterality: Right;    Social History   Socioeconomic History   Marital status: Married    Spouse name: Not on file   Number of children: Not on file   Years of education: Not on file   Highest education level: Not on file  Occupational History   Not on file  Tobacco Use   Smoking status: Never   Smokeless tobacco: Never  Vaping Use   Vaping status: Never Used  Substance and Sexual Activity   Alcohol use: No   Drug use: No   Sexual activity: Not on file  Other Topics Concern   Not on file  Social History Narrative   Married   Social Drivers of Health   Financial Resource Strain: Low Risk  (11/17/2022)   Received from [facility]   Overall Financial Resource Strain (CARDIA)    Difficulty of Paying Living Expenses: Not very hard  Food Insecurity: No Food Insecurity (10/18/2023)   Hunger Vital Sign    Worried About Running Out of Food in the Last Year: Never true    Ran Out of Food in the Last Year: Never true  Transportation Needs: No Transportation Needs (10/18/2023)   PRAPARE - Administrator, Civil Service (Medical): No    Lack of Transportation (Non-Medical): No  Physical Activity: Inactive (11/17/2022)   Received from [facility]   Exercise Vital Sign    On average, how many days per week do you engage in moderate to strenuous exercise (like a brisk walk)?: 0 days    On average, how many minutes do you engage in exercise at this level?: 0 min  Stress: Stress Concern Present (11/17/2022)   Received from [facility] - Occupational Stress Questionnaire    Feeling  of Stress : Rather much  Social Connections: Moderately Integrated (10/18/2023)   Social Connection and Isolation Panel    Frequency of Communication with Friends and Family: Three times a week    Frequency of Social Gatherings with Friends and Family: Once a week    Attends Religious Services: 1 to 4 times per year    Active Member of [employer] or Organizations: Not on file    Attends Banker Meetings: 1 to 4 times per year    Marital Status: Widowed  Intimate Partner Violence: Not At Risk (10/18/2023)   Humiliation, Afraid, Rape, and Kick questionnaire    Fear of Current or Ex-Partner: No    Emotionally Abused: No    Physically Abused: No  Sexually Abused: No    Family History  Problem Relation Age of Onset   Heart failure Mother    No Known Allergies I? Current Facility-Administered Medications  Medication Dose Route Frequency Provider Last Rate Last Admin   acetaminophen  (TYLENOL ) tablet 650 mg  650 mg Oral Q6H PRN [place], Eric J, DO       acyclovir  (ZOVIRAX ) tablet 400 mg  400 mg Oral Daily Byrum, Robert S, MD   400 mg at 10/20/23 9185   allopurinol  (ZYLOPRIM ) tablet 100 mg  100 mg Oral BID Byrum, Robert S, MD   100 mg at 10/20/23 9185   aspirin  EC tablet 81 mg  81 mg Oral Daily Shlomo Wilbert SAUNDERS, MD   81 mg at 10/20/23 9185   brimonidine  (ALPHAGAN ) 0.2 % ophthalmic solution 1 drop  1 drop Right Eye BID Shelah Lamar RAMAN, MD   1 drop at 10/20/23 9175   Chlorhexidine  Gluconate Cloth 2 % PADS 6 each  6 each Topical Daily Byrum, Robert S, MD   6 each at 10/19/23 1536   cholecalciferol  (VITAMIN D3) 25 MCG (1000 UNIT) tablet 1,000 Units  1,000 Units Oral Daily Byrum, Robert S, MD   1,000 Units at 10/20/23 0815   cyanocobalamin  (VITAMIN B12) tablet 2,500 mcg  2,500 mcg Oral Daily Byrum, Robert S, MD   2,500 mcg at 10/20/23 0815   docusate sodium  (COLACE) capsule 100 mg  100 mg Oral BID PRN Byrum, Robert S, MD       finasteride  (PROSCAR ) tablet 5 mg  5 mg Oral Daily Byrum, Robert  S, MD   5 mg at 10/20/23 9184   gabapentin  (NEURONTIN ) capsule 100 mg  100 mg Oral BID PRN Shelah Lamar RAMAN, MD       heparin  ADULT infusion 100 units/mL (25000 units/250mL)  1,400 Units/hr Intravenous Continuous Kemp Arvin DASEN, RPH 14 mL/hr at 10/20/23 0703 1,400 Units/hr at 10/20/23 0703   ondansetron  (ZOFRAN ) tablet 8 mg  8 mg Oral Q8H PRN Shelah Lamar RAMAN, MD       Oral care mouth rinse  15 mL Mouth Rinse PRN Byrum, Robert S, MD       oxidized cellulose (Surgicel) pad 1 each  1 each Topical Once [place], Eric J, DO       pantoprazole  (PROTONIX ) EC tablet 40 mg  40 mg Oral Daily Byrum, Robert S, MD   40 mg at 10/20/23 9185   penicillin  G potassium 24 Million Units in dextrose  5 % 500 mL CONTINUOUS infusion  24 Million Units Intravenous Q24H [place], Eric J, DO       polyethylene glycol (MIRALAX  / GLYCOLAX ) packet 17 g  17 g Oral Daily PRN Shelah Lamar RAMAN, MD       potassium PHOSPHATE  30 mmol in dextrose  5 % 500 mL infusion  30 mmol Intravenous Once Carolee Rosaline DASEN, RPH       pravastatin  (PRAVACHOL ) tablet 40 mg  40 mg Oral QHS Byrum, Robert S, MD   40 mg at 10/19/23 2147   sodium chloride  flush (NS) 0.9 % injection 10-40 mL  10-40 mL Intracatheter Q12H Shelah Lamar RAMAN, MD   20 mL at 10/19/23 2153   sodium chloride  flush (NS) 0.9 % injection 10-40 mL  10-40 mL Intracatheter PRN Shelah Lamar RAMAN, MD       traMADol  (ULTRAM ) tablet 50 mg  50 mg Oral Q6H PRN [place], Camellia PARAS, DO         Abtx:  Anti-infectives (From admission, onward)  Start     Dose/Rate Route Frequency Ordered Stop   10/20/23 1000  penicillin  G potassium 24 Million Units in dextrose  5 % 500 mL CONTINUOUS infusion        24 Million Units 20.8 mL/hr over 24 Hours Intravenous Every 24 hours 10/20/23 0919     10/19/23 2300  vancomycin  (VANCOREADY) IVPB 1250 mg/250 mL  Status:  Discontinued        1,250 mg 166.7 mL/hr over 90 Minutes Intravenous Every 24 hours 10/18/23 2325 10/20/23 0909   10/19/23 0600  ceFAZolin  (ANCEF )  IVPB 2g/100 mL premix  Status:  Discontinued        2 g 200 mL/hr over 30 Minutes Intravenous Every 8 hours 10/18/23 2311 10/20/23 0909   10/18/23 2200  vancomycin  (VANCOREADY) IVPB 1250 mg/250 mL  Status:  Discontinued        1,250 mg 166.7 mL/hr over 90 Minutes Intravenous Every 24 hours 10/18/23 0933 10/18/23 2311   10/18/23 1400  ceFEPIme  (MAXIPIME ) 2 g in sodium chloride  0.9 % 100 mL IVPB  Status:  Discontinued        2 g 200 mL/hr over 30 Minutes Intravenous 2 times daily 10/18/23 0933 10/18/23 2311   10/18/23 1130  acyclovir  (ZOVIRAX ) tablet 400 mg        400 mg Oral Daily 10/18/23 0924     10/18/23 0230  vancomycin  (VANCOCIN ) IVPB 1000 mg/200 mL premix  Status:  Discontinued        1,000 mg 200 mL/hr over 60 Minutes Intravenous  Once 10/18/23 0222 10/18/23 0228   10/18/23 0230  ceFEPIme  (MAXIPIME ) 2 g in sodium chloride  0.9 % 100 mL IVPB        2 g 200 mL/hr over 30 Minutes Intravenous  Once 10/18/23 0222 10/18/23 0255   10/18/23 0230  vancomycin  (VANCOREADY) IVPB 1500 mg/300 mL        1,500 mg 150 mL/hr over 120 Minutes Intravenous  Once 10/18/23 0228 10/18/23 0501       REVIEW OF SYSTEMS:  Const:  fever, chills, negative weight loss Eyes: negative diplopia or visual changes, negative eye pain ENT: painful swelling rt side of the facet Resp: negative cough, hemoptysis, dyspnea Cards: +chest pain,  palpitations, ++lower extremity edema GU: negative for frequency, dysuria and hematuria GI: Negative for abdominal pain, diarrhea, bleeding, constipation Skin: negative for rash and pruritus Heme: negative for easy bruising and gum/nose bleeding MS:  weakness Neurolo:negative for headaches, dizziness, vertigo, memory problems  Psych: negative for feelings of anxiety, depression  Endocrine: negative for thyroid , diabetes Allergy/Immunology- NO VITALS:  BP (!) 142/61   Pulse 62   Temp 97.6 F (36.4 C) (Oral)   Resp 11   Ht 5' 6 (1.676 m)   Wt 64 kg   SpO2 98%   BMI  22.77 kg/m  LDA Foley Central line Other drainage tubes PHYSICAL EXAM:  General: Alert, cooperative, some  distress due to the rt facial swelling,  Head: Normocephalic, without obvious abnormality, atraumatic. Eyes: Conjunctivae clear, anicteric sclerae. Pupils are equal ENT Nares normal. No drainage or sinus tenderness. Unable to open mouth completely- no drooling of saliva No choking     Neck:as above Back: No CVA tenderness. Lungs: Clear to auscultation bilaterally. No Wheezing or Rhonchi. No rales. Heart: Regular rate and rhythm, no murmur, rub or gallop. Abdomen: Soft, non-tender,not distended. Bowel sounds normal. No masses Extremities:ede ma legs Rt >>>>left Erythema rt leg much improved  Skin:as above Lymph: Cervical, ++ Neurologic: Grossly non-focal  Pertinent Labs Lab Results CBC    Component Value Date/Time   WBC 6.7 10/20/2023 0552   RBC 3.49 (L) 10/20/2023 0552   HGB 9.8 (L) 10/20/2023 0552   HGB 11.0 (L) 10/13/2023 1212   HCT 31.1 (L) 10/20/2023 0552   PLT 177 10/20/2023 0552   PLT 269 10/13/2023 1212   MCV 89.1 10/20/2023 0552   MCH 28.1 10/20/2023 0552   MCHC 31.5 10/20/2023 0552   RDW 15.1 10/20/2023 0552   LYMPHSABS 0.4 (L) 10/18/2023 0205   MONOABS 0.3 10/18/2023 0205   EOSABS 0.0 10/18/2023 0205   BASOSABS 0.0 10/18/2023 0205       Latest Ref Rng & Units 10/20/2023    5:52 AM 10/19/2023    4:06 PM 10/19/2023    2:42 AM  CMP  Glucose 70 - 99 mg/dL 88  98  877   BUN 8 - 23 mg/dL 19  22  27    Creatinine 0.61 - 1.24 mg/dL 9.18  9.05  9.14   Sodium 135 - 145 mmol/L 139  138  139   Potassium 3.5 - 5.1 mmol/L 3.6  3.7  3.2   Chloride 98 - 111 mmol/L 104  104  108   CO2 22 - 32 mmol/L 25  27  23    Calcium 8.9 - 10.3 mg/dL 7.9  8.3  8.2       Microbiology: Recent Results (from the past 240 hours)  Blood culture (routine x 2)     Status: Abnormal (Preliminary result)   Collection Time: 10/18/23  2:05 AM   Specimen: BLOOD RIGHT ARM  Result  Value Ref Range Status   Specimen Description   Final    BLOOD RIGHT ARM Performed at [facility], [address] [place], [place] [address]    Special Requests   Final    BOTTLES DRAWN AEROBIC AND ANAEROBIC Blood Culture results may not be optimal due to an inadequate volume of blood received in culture bottles Performed at [facility], [address], [place], [place] [address]    Culture  Setup Time   Final    GRAM POSITIVE COCCI IN CHAINS AEROBIC BOTTLE ONLY CRITICAL RESULT CALLED TO, READ BACK BY AND VERIFIED WITH: PHARMD LEANNE P [id number] FCP    Culture (A)  Final    GROUP B STREP(S.AGALACTIAE)ISOLATED SUSCEPTIBILITIES TO FOLLOW Performed at [facility], [address] [place], [place] [address]    Report Status PENDING  Incomplete  Blood culture (routine x 2)     Status: None (Preliminary result)   Collection Time: 10/18/23  2:05 AM   Specimen: BLOOD LEFT FOREARM  Result Value Ref Range Status   Specimen Description   Final    BLOOD LEFT FOREARM Performed at [facility], [address], [place], [place] [address]    Special Requests   Final    BOTTLES DRAWN AEROBIC AND ANAEROBIC Blood Culture results may not be optimal due to an inadequate volume of blood received in culture bottles Performed at [facility], [address] [place], [place] [address]    Culture   Final    NO GROWTH 2 DAYS Performed at [facility], [address] [place], [place] [address]    Report Status PENDING  Incomplete  Blood Culture ID Panel (Reflexed)     Status: Abnormal   Collection Time: 10/18/23  2:05 AM  Result Value Ref Range Status   Enterococcus faecalis  NOT DETECTED NOT DETECTED Final   Enterococcus Faecium NOT DETECTED NOT DETECTED Final   Listeria monocytogenes NOT DETECTED NOT DETECTED Final   Staphylococcus species NOT DETECTED NOT DETECTED Final   Staphylococcus aureus (BCID) NOT DETECTED NOT DETECTED  Final   Staphylococcus epidermidis NOT DETECTED NOT DETECTED Final   Staphylococcus lugdunensis NOT DETECTED NOT DETECTED Final   Streptococcus species DETECTED (A) NOT DETECTED Final    Comment: CRITICAL RESULT CALLED TO, READ BACK BY AND VERIFIED WITH: PHARMD LEANNE P [id number] FCP    Streptococcus agalactiae DETECTED (A) NOT DETECTED Final    Comment: CRITICAL RESULT CALLED TO, READ BACK BY AND VERIFIED WITH: PHARMD LEANNE P [id number] FCP    Streptococcus pneumoniae NOT DETECTED NOT DETECTED Final   Streptococcus pyogenes NOT DETECTED NOT DETECTED Final   A.calcoaceticus-baumannii NOT DETECTED NOT DETECTED Final   Bacteroides fragilis NOT DETECTED NOT DETECTED Final   Enterobacterales NOT DETECTED NOT DETECTED Final   Enterobacter cloacae complex NOT DETECTED NOT DETECTED Final   Escherichia coli NOT DETECTED NOT DETECTED Final   Klebsiella aerogenes NOT DETECTED NOT DETECTED Final   Klebsiella oxytoca NOT DETECTED NOT DETECTED Final   Klebsiella pneumoniae NOT DETECTED NOT DETECTED Final   Proteus species NOT DETECTED NOT DETECTED Final   Salmonella species NOT DETECTED NOT DETECTED Final   Serratia marcescens NOT DETECTED NOT DETECTED Final   Haemophilus influenzae NOT DETECTED NOT DETECTED Final   Neisseria meningitidis NOT DETECTED NOT DETECTED Final   Pseudomonas aeruginosa NOT DETECTED NOT DETECTED Final   Stenotrophomonas maltophilia NOT DETECTED NOT DETECTED Final   Candida albicans NOT DETECTED NOT DETECTED Final   Candida auris NOT DETECTED NOT DETECTED Final   Candida glabrata NOT DETECTED NOT DETECTED Final   Candida krusei NOT DETECTED NOT DETECTED Final   Candida parapsilosis NOT DETECTED NOT DETECTED Final   Candida tropicalis NOT DETECTED NOT DETECTED Final   Cryptococcus neoformans/gattii NOT DETECTED NOT DETECTED Final    Comment: Performed at [facility], [address], [place], [place] [address]  Resp panel by RT-PCR (RSV, Flu A&B, Covid)  Anterior Nasal Swab     Status: None   Collection Time: 10/18/23  2:26 AM   Specimen: Anterior Nasal Swab  Result Value Ref Range Status   SARS Coronavirus 2 by RT PCR NEGATIVE NEGATIVE Final    Comment: (NOTE) SARS-CoV-2 target nucleic acids are NOT DETECTED.  The SARS-CoV-2 RNA is generally detectable in upper respiratory specimens during the acute phase of infection. The lowest concentration of SARS-CoV-2 viral copies this assay can detect is 138 copies/mL. A negative result does not preclude SARS-Cov-2 infection and should not be used as the sole basis for treatment or other patient management decisions. A negative result may occur with  improper specimen collection/handling, submission of specimen other than nasopharyngeal swab, presence of viral mutation(s) within the areas targeted by this assay, and inadequate number of viral copies(<138 copies/mL). A negative result must be combined with clinical observations, patient history, and epidemiological information. The expected result is Negative.  Fact Sheet for Patients:  BloggerCourse.com  Fact Sheet for Healthcare Providers:  SeriousBroker.it  This test is no t yet approved or cleared by the [place]  FDA and  has been authorized for detection and/or diagnosis of SARS-CoV-2 by FDA under an Emergency Use Authorization (EUA). This EUA will remain  in effect (meaning this test can be used) for the duration of the COVID-19 declaration under Section 564(b)(1) of the Act, 21  U.S.C.section 360bbb-3(b)(1), unless the authorization is terminated  or revoked sooner.       Influenza A by PCR NEGATIVE NEGATIVE Final   Influenza B by PCR NEGATIVE NEGATIVE Final    Comment: (NOTE) The Xpert Xpress SARS-CoV-2/FLU/RSV plus assay is intended as an aid in the diagnosis of influenza from Nasopharyngeal swab specimens and should not be used as a sole basis for treatment. Nasal washings  and aspirates are unacceptable for Xpert Xpress SARS-CoV-2/FLU/RSV testing.  Fact Sheet for Patients: BloggerCourse.com  Fact Sheet for Healthcare Providers: SeriousBroker.it  This test is not yet approved or cleared by the [place]  FDA and has been authorized for detection and/or diagnosis of SARS-CoV-2 by FDA under an Emergency Use Authorization (EUA). This EUA will remain in effect (meaning this test can be used) for the duration of the COVID-19 declaration under Section 564(b)(1) of the Act, 21 U.S.C. section 360bbb-3(b)(1), unless the authorization is terminated or revoked.     Resp Syncytial Virus by PCR NEGATIVE NEGATIVE Final    Comment: (NOTE) Fact Sheet for Patients: BloggerCourse.com  Fact Sheet for Healthcare Providers: SeriousBroker.it  This test is not yet approved or cleared by the [place]  FDA and has been authorized for detection and/or diagnosis of SARS-CoV-2 by FDA under an Emergency Use Authorization (EUA). This EUA will remain in effect (meaning this test can be used) for the duration of the COVID-19 declaration under Section 564(b)(1) of the Act, 21 U.S.C. section 360bbb-3(b)(1), unless the authorization is terminated or revoked.  Performed at [facility], [address] [place], [place] [address]   Urine Culture     Status: None   Collection Time: 10/18/23  6:16 AM   Specimen: Urine, Clean Catch  Result Value Ref Range Status   Specimen Description   Final    URINE, CLEAN CATCH Performed at [facility], [address], [place], [place] [address]    Special Requests   Final    NONE Performed at [facility], [address] [place], [place] [address]    Culture   Final    NO GROWTH Performed at [facility], [address], [place], [place] [address]    Report Status  10/19/2023 FINAL  Final  MRSA Next Gen by PCR, Nasal     Status: None   Collection Time: 10/18/23  9:19 AM   Specimen: Nasal Mucosa; Nasal Swab  Result Value Ref Range Status   MRSA by PCR Next Gen NOT DETECTED NOT DETECTED Final    Comment: (NOTE) The GeneXpert MRSA Assay (FDA approved for NASAL specimens only), is one component of a comprehensive MRSA colonization surveillance program. It is not intended to diagnose MRSA infection nor to guide or monitor treatment for MRSA infections. Test performance is not FDA approved in patients less than 67 years old. Performed at [facility], [address], [place], [place] [address]   Culture, blood (Routine X 2) w Reflex to ID Panel     Status: None (Preliminary result)   Collection Time: 10/19/23  2:42 AM   Specimen: BLOOD LEFT HAND  Result Value Ref Range Status   Specimen Description BLOOD LEFT HAND  Final   Special Requests   Final    BOTTLES DRAWN AEROBIC ONLY Blood Culture results may not be optimal due to an inadequate volume of blood received in culture bottles   Culture   Final    NO GROWTH < 24 HOURS Performed at  [facility], [address], [place], [place] [address]    Report Status PENDING  Incomplete  Culture, blood (Routine X 2) w Reflex to ID Panel     Status: None (Preliminary result)   Collection Time: 10/19/23  2:42 AM   Specimen: BLOOD LEFT ARM  Result Value Ref Range Status   Specimen Description BLOOD LEFT ARM  Final   Special Requests   Final    BOTTLES DRAWN AEROBIC ONLY Blood Culture results may not be optimal due to an inadequate volume of blood received in culture bottles   Culture   Final    NO GROWTH < 24 HOURS Performed at [facility], [address], [place], [place] [address]    Report Status PENDING  Incomplete    Lines and Device Date on insertion # of days DC  Central line     Foley     ETT       IMAGING RESULTS: CXR N Port in place I have personally  reviewed the films ? Impression/Recommendation Fever sudden onset in a patient with a rapidly growing rt facial mass which was recently biopsied Group B streptococcus bacteremia Source could be rt leg cellulitis rt facial mass, biopsy site Or PORT Agree with penicillin  IV to treat GBS  HE has lymphedema with h/o cellulitis rt leg Though the erthema could be due to just swelling  Rt facial mass is enlarged with tightness and patient inability to open the mouth freely R/o hemorrhage into the tumor especially he is on heparin  and recent biopsy   Currently not to use the port. No need to remove it as there is no redness or tenderness at the port site  Make sure no bleeding at the port site Low risk for endocarditis ? ?Anemia  High trponin- demand ischemia in a patient with CAD and stents Seen by cardiology  Discussed the management with patient, daughter, hospitalist and his nurse and anitmicrobial stewardship pharmacists ? _I have personally spent  -80--minutes involved in face-to-face and non-face-to-face activities for this patient on the day of the visit. Professional time spent includes the following activities: Preparing to see the patient (review of tests), Obtaining and/or reviewing separately obtained history (admission/discharge record), Performing a medically appropriate examination and/or evaluation , Ordering medications/tests/procedures, referring and communicating with other health care professionals, Documenting clinical information in the EMR, Independently interpreting results (not separately reported), Communicating results to the patient/family/, Counseling and educating the patient/family/ and Care coordination with care team members This consult involved complex antimicrobial management  ________________________________________________

## 2023-10-20 NOTE — Progress Notes (Signed)
 [facility] Adult [facility] replacement protocol  Phos 1.9, K 3.7   Plan: Kphos 30 mMol (44 K) per protocol  Rosaline IVAR Edison, Pharm.D Use secure chat for questions 10/20/2023 7:34 AM

## 2023-10-20 NOTE — Progress Notes (Signed)
 PHARMACY - ANTICOAGULATION CONSULT NOTE  Pharmacy Consult for Heparin  Indication: chest pain/ACS  No Known Allergies  Patient Measurements: Height: 5' 6 (167.6 cm) Weight: 64 kg (141 lb 1.5 oz) IBW/kg (Calculated) : 63.8 HEPARIN  DW (KG): 64  Vital Signs: Temp: 97.6 F (36.4 C) (08/04 0800) Temp Source: Oral (08/04 0800) BP: 142/61 (08/04 0700) Pulse Rate: 62 (08/04 0700)  Labs: Recent Labs    10/18/23 0205 10/18/23 0205 10/18/23 0409 10/18/23 1543 10/18/23 1826 10/18/23 1929 10/18/23 2335 10/19/23 0242 10/19/23 1247 10/19/23 1606 10/20/23 0014 10/20/23 0552 10/20/23 1109  HGB 11.7*  --   --   --   --   --   --  9.3*  --   --   --  9.8*  --   HCT 36.5*  --   --   --   --   --   --  30.3*  --   --   --  31.1*  --   PLT 235  --   --   --   --   --   --  199  --   --   --  177  --   APTT  --   --   --   --  31  --   --   --   --   --   --   --   --   LABPROT  --   --   --   --  16.1*  --   --   --   --   --   --   --   --   INR  --   --   --   --  1.2  --   --   --   --   --   --   --   --   HEPARINUNFRC  --    < >  --   --   --   --   --  <0.10* 0.17*  --  0.21*  --  0.26*  CREATININE 0.77  --   --   --   --   --   --  0.85  --  0.94  --  0.81  --   TROPONINIHS 18*  --    < > 735*  --  575* 539*  --   --   --   --   --   --    < > = values in this interval not displayed.    Estimated Creatinine Clearance: 59.1 mL/min (by C-G formula based on SCr of 0.81 mg/dL).   Medical History: Past Medical History:  Diagnosis Date   BPH (benign prostatic hypertrophy)    CAD (coronary artery disease) 01/2010   S/P drug eluting stents LAD and PL branch of [facility] Dr. 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  Evaluated today by cardiology for chest pain and increasingly  elevated troponins.   10/19/23 Heparin  level 0.26  slightly subtherapeutic but increasing after heparin  1000 unit bolus and rate increase to 1400 units/hr Pt with bleeding/oozing at [facility] this morning> RN applied surgicel pad with improvement Dopplers + age indeterminate LLE DVT Hg 9.8, stable, PLT 177 WNL CT soft tissue of neck w/  contrast ordered to r/o hemorrhage within the neck mass in setting of recent biopsy  Goal of Therapy:  Heparin  level 0.3-0.7 units/ml Monitor platelets by anticoagulation protocol: Yes   Plan:  -continue heparin  gtt @ 1400 units/hr  - TRH ordered CT soft tissue of neck to r/o hemorrhage within the neck mass s/p recent biopsy -Check another heparin  level  in 8 hsr  -Monitor daily heparin  level when titrated to goal, CBC, signs/symptoms of bleeding - f/u transition to DOAC once no invasive procedures planned (may need PAC removal)   Rosaline IVAR Edison, Pharm.D Use secure chat for questions 10/20/2023 12:43 PM

## 2023-10-20 NOTE — Plan of Care (Signed)
  Problem: Education: Goal: Knowledge of General Education information will improve Description: Including pain rating scale, medication(s)/side effects and non-pharmacologic comfort measures Outcome: Progressing   Problem: Clinical Measurements: Goal: Respiratory complications will improve Outcome: Progressing Goal: Cardiovascular complication will be avoided Outcome: Progressing   Problem: Activity: Goal: Risk for activity intolerance will decrease Outcome: Progressing   Problem: Nutrition: Goal: Adequate nutrition will be maintained Outcome: Progressing   Problem: Elimination: Goal: Will not experience complications related to bowel motility Outcome: Progressing Goal: Will not experience complications related to urinary retention Outcome: Progressing

## 2023-10-20 NOTE — Progress Notes (Signed)
   10/20/23 1403  TOC Brief Assessment  Insurance and Status Reviewed  Patient has primary care physician Yes Taras, Alm, MD)  Home environment has been reviewed Single family home  Prior level of function: Independent with ADL's  Prior/Current Home Services No current home services  Social Drivers of Health Review SDOH reviewed no interventions necessary  Readmission risk has been reviewed Yes  Transition of care needs no transition of care needs at this time   Pt screened. There are no HH, DME or SDOH needs at this time. Care Management will follow for any DC needs.

## 2023-10-21 DIAGNOSIS — R6521 Severe sepsis with septic shock: Secondary | ICD-10-CM | POA: Diagnosis not present

## 2023-10-21 DIAGNOSIS — B951 Streptococcus, group B, as the cause of diseases classified elsewhere: Secondary | ICD-10-CM | POA: Diagnosis not present

## 2023-10-21 DIAGNOSIS — R7881 Bacteremia: Secondary | ICD-10-CM

## 2023-10-21 DIAGNOSIS — R7989 Other specified abnormal findings of blood chemistry: Secondary | ICD-10-CM | POA: Diagnosis not present

## 2023-10-21 DIAGNOSIS — I5033 Acute on chronic diastolic (congestive) heart failure: Secondary | ICD-10-CM | POA: Diagnosis not present

## 2023-10-21 DIAGNOSIS — C8518 Unspecified B-cell lymphoma, lymph nodes of multiple sites: Secondary | ICD-10-CM

## 2023-10-21 DIAGNOSIS — A419 Sepsis, unspecified organism: Secondary | ICD-10-CM | POA: Diagnosis not present

## 2023-10-21 DIAGNOSIS — R220 Localized swelling, mass and lump, head: Secondary | ICD-10-CM | POA: Diagnosis not present

## 2023-10-21 DIAGNOSIS — R509 Fever, unspecified: Secondary | ICD-10-CM | POA: Diagnosis not present

## 2023-10-21 DIAGNOSIS — I82402 Acute embolism and thrombosis of unspecified deep veins of left lower extremity: Secondary | ICD-10-CM | POA: Diagnosis not present

## 2023-10-21 DIAGNOSIS — C8338 Diffuse large B-cell lymphoma, lymph nodes of multiple sites: Secondary | ICD-10-CM | POA: Diagnosis not present

## 2023-10-21 DIAGNOSIS — I890 Lymphedema, not elsewhere classified: Secondary | ICD-10-CM | POA: Diagnosis not present

## 2023-10-21 LAB — CULTURE, BLOOD (ROUTINE X 2)

## 2023-10-21 LAB — CBC
HCT: 33.6 % — ABNORMAL LOW (ref 39.0–52.0)
Hemoglobin: 10.2 g/dL — ABNORMAL LOW (ref 13.0–17.0)
MCH: 27.4 pg (ref 26.0–34.0)
MCHC: 30.4 g/dL (ref 30.0–36.0)
MCV: 90.3 fL (ref 80.0–100.0)
Platelets: 180 K/uL (ref 150–400)
RBC: 3.72 MIL/uL — ABNORMAL LOW (ref 4.22–5.81)
RDW: 15 % (ref 11.5–15.5)
WBC: 5.5 K/uL (ref 4.0–10.5)
nRBC: 0 % (ref 0.0–0.2)

## 2023-10-21 LAB — BASIC METABOLIC PANEL WITH GFR
Anion gap: 11 (ref 5–15)
BUN: 17 mg/dL (ref 8–23)
CO2: 25 mmol/L (ref 22–32)
Calcium: 8.2 mg/dL — ABNORMAL LOW (ref 8.9–10.3)
Chloride: 101 mmol/L (ref 98–111)
Creatinine, Ser: 0.84 mg/dL (ref 0.61–1.24)
GFR, Estimated: >60 mL/min (ref >60)
Glucose, Bld: 86 mg/dL (ref 70–99)
Potassium: 3.8 mmol/L (ref 3.5–5.1)
Sodium: 137 mmol/L (ref 135–145)

## 2023-10-21 LAB — HEPARIN LEVEL (UNFRACTIONATED)
Heparin Unfractionated: 0.38 [IU]/mL (ref 0.30–0.70)
Heparin Unfractionated: 0.41 [IU]/mL (ref 0.30–0.70)

## 2023-10-21 LAB — BRAIN NATRIURETIC PEPTIDE: B Natriuretic Peptide: 165.3 pg/mL — ABNORMAL HIGH (ref 0.0–100.0)

## 2023-10-21 LAB — SURGICAL PATHOLOGY

## 2023-10-21 LAB — MAGNESIUM: Magnesium: 2.1 mg/dL (ref 1.7–2.4)

## 2023-10-21 NOTE — Plan of Care (Signed)
  Problem: Activity: Goal: Risk for activity intolerance will decrease Outcome: Progressing   Problem: Nutrition: Goal: Adequate nutrition will be maintained Outcome: Progressing   Problem: Clinical Measurements: Goal: Diagnostic test results will improve Outcome: Not Progressing

## 2023-10-21 NOTE — Progress Notes (Signed)
 Date of Admission:  10/18/2023     ID: Harold [name] is a 89 y.o. 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)    Subjective: Feeling better today Face mass not as tight as yesterday No fever or chills  Medications:   acyclovir   400 mg Oral Daily   allopurinol   100 mg Oral BID   aspirin  EC  81 mg Oral Daily   atenolol   12.5 mg Oral Daily   brimonidine   1 drop Right Eye BID   Chlorhexidine  Gluconate Cloth  6 each Topical Daily   cholecalciferol   1,000 Units Oral Daily   cyanocobalamin   2,500 mcg Oral Daily   finasteride   5 mg Oral Daily   furosemide   40 mg Intravenous Daily   pantoprazole   40 mg Oral Daily   pravastatin   40 mg Oral QHS   sodium chloride  flush  10-40 mL Intracatheter Q12H    Objective: Vital signs in last 24 hours: Patient Vitals for the past 24 hrs:  BP Temp Temp src Pulse Resp SpO2 Weight  10/21/23 1600 -- 98.2 F (36.8 C) Oral -- -- -- --  10/21/23 1300 -- -- -- 71 16 97 % --  10/21/23 1200 -- 97.9 F (36.6 C) Oral 76 19 99 % --  10/21/23 0937 (!) 117/41 -- -- 70 -- -- --  10/21/23 0800 -- 98.1 F (36.7 C) Oral -- -- -- --  10/21/23 0500 -- -- -- -- -- -- 64.7 kg  10/21/23 0400 (!) 117/41 98.3 F (36.8 C) Oral 66 15 98 % --  10/21/23 0215 (!) 123/41 -- -- 69 15 99 % --  10/21/23 0200 (!) 120/34 -- -- 61 14 98 % --  10/21/23 0100 (!) 122/39 -- -- 71 18 97 % --  10/21/23 0018 (!) 105/39 -- -- 66 13 97 % --  10/21/23 0000 (!) 104/43 -- -- 73 15 95 % --  10/20/23 2250 -- 97.7 F (36.5 C) Oral 66 12 97 % --  10/20/23 2200 (!) 106/49 -- -- 75 16 98 % --  10/20/23 2102 101/66 -- -- 75 18 96 % --  10/20/23 2100 (!) 100/28 -- -- 83 18 94 % --  10/20/23 2000 (!) 99/34 -- -- 77 13 98 % --       PHYSICAL  EXAM:  General: Alert, cooperative, no distress, appears stated age.  Rt facial mass- less erythematous Lungs: Clear to auscultation bilaterally. No Wheezing or Rhonchi. No rales. Heart: Regular rate and rhythm, no murmur, rub or gallop. PORT site has superificial bruising Abdomen: Soft, non-tender,not distended. Bowel sounds normal. No masses Extremities: edema legs Erythema rt leg resolved Skin: No rashes or lesions. Or bruising Lymph: Cervical, supraclavicular normal. Neurologic: Grossly non-focal  Lab Results    Latest Ref Rng & Units 10/21/2023    7:52 AM 10/20/2023    5:52 AM 10/19/2023    2:42 AM  CBC  WBC 4.0 - 10.5 K/uL 5.5  6.7  12.6   Hemoglobin 13.0 - 17.0 g/dL 89.7  9.8  9.3   Hematocrit 39.0 - 52.0 % 33.6  31.1  30.3   Platelets 150 - 400 K/uL 180  177  199  Latest Ref Rng & Units 10/21/2023    7:52 AM 10/20/2023    5:52 AM 10/19/2023    4:06 PM  CMP  Glucose 70 - 99 mg/dL 86  88  98   BUN 8 - 23 mg/dL 17  19  22    Creatinine 0.61 - 1.24 mg/dL 9.15  9.18  9.05   Sodium 135 - 145 mmol/L 137  139  138   Potassium 3.5 - 5.1 mmol/L 3.8  3.6  3.7   Chloride 98 - 111 mmol/L 101  104  104   CO2 22 - 32 mmol/L 25  25  27    Calcium 8.9 - 10.3 mg/dL 8.2  7.9  8.3       Microbiology: 10/18/23 BC 1 set of 2 group b streptococcus 10/19/23 BC- NG Studies/Results: CT SOFT TISSUE NECK W CONTRAST Result Date: 10/20/2023 CLINICAL DATA:  Right neck mass EXAM: CT NECK WITH CONTRAST TECHNIQUE: Multidetector CT imaging of the neck was performed using the standard protocol following the bolus administration of intravenous contrast. RADIATION DOSE REDUCTION: This exam was performed according to the departmental dose-optimization program which includes automated exposure control, adjustment of the mA and/or kV according to patient size and/or use of iterative reconstruction technique. CONTRAST:  75mL OMNIPAQUE  IOHEXOL  300 MG/ML  SOLN COMPARISON:  10/14/2023 FINDINGS: Again noted is a large  mass superficial to the mandible in the right-side of the neck. There is no hemorrhage. Pharynx: The nasopharynx, oropharynx and hypopharynx are normal Oral cavity/floor of mouth: Normal Larynx: Normal Salivary glands: The parotid and submandibular glands are normal Thyroid : Normal Lymph nodes: No adenopathy Vascular: No significant abnormality Limited intracranial: No significant abnormality Visualized orbits: No significant abnormality Mastoids and visualized paranasal sinuses: No significant abnormality Skeleton: No significant abnormality Upper chest: No significant abnormality Other: None IMPRESSION: Approximally 11.6 by 4 x 11 cm mass in the right side of the face/neck unchanged from the prior study. No hemorrhage. Electronically Signed   By: Nancyann Burns M.D.   On: 10/20/2023 15:08     Assessment/Plan:  Fever sudden onset in a patient with a rapidly growing rt facial mass which was recently biopsied Group B streptococcus bacteremia Source could be rt leg cellulitis rt facial mass, biopsy site Or PORT Agree with penicillin  IV to treat GBS   HE has lymphedema with h/o cellulitis rt leg Though the erthema could be due to just swelling   Rt facial mass is enlarged but tightness and redness improved NO hemorrhage into the tumor on CT (especially he was on IV heparin  and recent biopsy)     Can use the  port. If he experiences any fever or chills then it will have to be removed    Low risk for endocarditis ? Anemia   High trponin- demand ischemia in a patient with CAD and stents  DVT-left PT and Pveins-age indeterminate  Discussed the management with the patient in detail  Discussed with his nurse and hospitalist

## 2023-10-21 NOTE — Progress Notes (Signed)
 Harold [name]    Harold [name]  FMW:[id number] DOB: [birth year] DOA: 10/18/2023 PCP: [name], MD    Brief Narrative:   Harold [name] is a 89 y.o. 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  Patient recently admitted 7/28 - 7/31 for rapidly enlarging right neck mass concerning for high-grade lymphoma.  Seen by medical oncology and underwent IR biopsy and Port-A-Cath placement on 7/30 and subsequently discharged home on prednisone  with planned initiation of chemotherapy on 8/4.  In the ED, temperature 98.9 F, HR 110, RR 20, BP 83/48.  WBC 9.5, hemoglobin 11.7, platelet count 235.  Sodium 141, potassium 3.3, chloride 101, CO2 26, glucose 99, BUN 35, creatinine 0.77.  High sensitive troponin 18>48>421.  BNP 625.0.  Lactic acid 2.6.  Urinalysis unrevealing.  Influenza/RSV/COVID PCR negative.  Blood cultures x 2 and urine culture obtained.  Chest x-ray with no acute cardiopulmonary disease process.  Ultrasound bilateral lower extremities with no evidence of DVT RLE, findings of age-indeterminate DVT left posterior tibial veins and left peroneal veins.  Patient was aggressively resuscitated with IV fluids, started on vancomycin  and cefepime  and placed on a norepinephrine  drip, heparin  drip.  Cardiology was consulted.  PCCM was consulted and patient was admitted to the intensive care unit.  Significant hospital events: 8/2: admit by PCCM to ICU, Vanc/Cefepime , U/S LE with age indeterminate DVT LLE, heparin  gtt, Cards consulted, on Levophed  drip 8/3: Blood Cx 1 out 4 (aerobic) + Streptococcus per BCID, antibiotics changed to vancomycin  and cefazolin ; titrated off Levophed , TTE completed 8/4: Transfer to SDU under TRH, antibiotics further  de-escalated to penicillin , ID consulted, CT soft tissue neck w/o concern for hemorrhage within right neck mass  Assessment & Plan:   Septic shock secondary to group B Streptococcus bacteremia Right lower extremity cellulitis Patient presenting to the ED with fever, weakness.  Recently diagnosed with presumed high-grade lymphoma in which she underwent recent Port-A-Cath placement and biopsy of right neck mass on 7/30.  Potential etiology for bacterial translocation.  On arrival to the ED patient was afebrile with temperature 98.9, and WBC count 9.5.  Elevated lactic acid 2.6.  Patient was aggressively resuscitated, but remained hypotensive and subsequently placed on vasopressor support.  Patient was admitted to the intensive care unit and started on IV vancomycin  and cefepime .  Blood cultures 1 out of 4 growing Streptococcus antibiotics were de-escalated to vancomycin  and cefazolin  and now to penicillin  alone.  TTE with LVEF 60 to 65%, moderate thickening of the aortic valve but no other valvular abnormalities identified.  Vasopressors were weaned off. -- Infectious disease following, appreciate assistance -- Blood Cx x 2 8/2: 1/4 (aerobic) + group B Streptococcus -- Repeat blood cultures 8/3: no growth x 2 days -- Urine culture: No growth -- Continue IV penicillin  -- Currently ID okay with continued port in place, will hold off on use currently  NSTEMI Patient presenting with chest pain, notably elevated troponin.  History of CAD with LHC 01/2023 60% ostial to prox RCA, 30% mid RCA, 40% prox LCx, 80% mid LAD proximal to prior LAD stent, 30% ISR of mid LAD stent , 50% dLAD and then occluded dLAD status post intracoronary lithotripsy of heavily calcified LAD with suboptimal expansion with balloon and therefore stent was not  placed.  TTE with LVEF 60-65%, no regional wall motion abnormalities, grade 2 diastolic dysfunction. -- Cardiology following, appreciate assistance -- hs Troponin  18>48>421>610>735>575>539 -- Continues on heparin  drip -- Currently chest pain-free, monitor on telemetry  Low-grade follicular lymphoma Rapidly enlarging right neck mass Concern for high-grade lymphoma Patient recently admitted for rapidly enlarging right neck mass with concern for high-grade lymphoma.  CT neck with contrast 7/29: Very large and transfacial up to 12 cm long axis lymphomatous mass about the right mandible, corresponding to the large FDG avid tumor in June.  Adjacent abnormal level 1A lymph node in the midline is new or progressed since June. S/p IR core biopsies and Port-A-Cath placement on 7/30.  CT soft tissue neck 8/4 negative for hemorrhage within the right neck mass. -- Follows with Medical Oncology, Dr. [name] -- Pathology from lymph node biopsy 7/30: pending -- Oncology plans to initiate chemotherapy during this hospitalization  Age indeterminant LLE DVT Vascular duplex ultrasound on admission notable for age-indeterminate DVT left posterior tibial veins and left peroneal veins.  Previously known history of DVT no longer on Xarelto. -- Remains on heparin  drip; plan to transition to NOAC once final determination on need for port removal  Hypokalemia Repleted.  Potassium 3.8 today.  Essential hypertension Chronic diastolic congestive heart failure, compensated -- Atenolol  12.5 mg p.o. daily -- Furosemide  40 mg IV every 24 hours -- Continue monitor BP closely -- Strict I's and O's and daily weights   Hyperlipidemia -- Continue pravastatin  40 mg PO daily   CAD s/p remote stents Denies chest pain/angina.  No longer on Brilinta . -- Continue statin, atenolol    BPH -- Continue finasteride  5 mg p.o. daily   Anemia of chronic disease Hemoglobin stable, 9.8   DVT prophylaxis: Heparin  drip    Code Status: Limited: Do not attempt resuscitation (DNR) -DNR-LIMITED -Do Not Intubate/DNI  Family Communication: No family present bedside this morning  Disposition Plan:   Level of care: Stepdown Status is: Inpatient Remains inpatient appropriate because: IV antibiotics, awaiting final ID recommendations, oncology currently plans to initiate chemotherapy during this hospitalization    Consultants:  PCCM: signed off 8/4 Cardiology Infectious Disease  Procedures:  TTE  Antimicrobials:  Vancomycin  8/1 - 8/3 Cefepime  8/1 - 8/2 Cefazolin  8/2 - 8/3 Penicillin  8/4>>   Subjective: Patient seen examined bedside, lying in bed.  No family present.  No complaints or concerns this morning.  Went over CT results with no findings of hemorrhage within right neck mass which is reassuring.  Remains on IV heparin  drip and IV penicillin .  Repeat blood cultures 8/3 remain negative x 2 days.  Requesting assistance with ordering breakfast.  No other specific complaints or concerns at this time.  Denies headache, no vision changes, no chest pain, no palpitations, no shortness of breath, no abdominal pain, no fever/chills/night sweats, no nausea/vomiting/diarrhea, no focal weakness, no fatigue.  No acute events overnight per nursing staff.  Objective: Vitals:   10/21/23 0215 10/21/23 0400 10/21/23 0500 10/21/23 0800  BP: (!) 123/41 (!) 117/41    Pulse: 69 66    Resp: 15 15    Temp:  98.3 F (36.8 C)  98.1 F (36.7 C)  TempSrc:  Oral  Oral  SpO2: 99% 98%    Weight:   64.7 kg   Height:        Intake/Output Summary (Last 24 hours) at 10/21/2023 0935 Last data filed at 10/21/2023 0800 Gross per 24 hour  Intake 1684.43 ml  Output 2950 ml  Net -1265.57 ml   Filed Weights   10/20/23 0500 10/20/23 0600 10/21/23 0500  Weight: 69.1 kg 64 kg 64.7 kg    Examination:  Physical Exam: GEN: NAD, alert and oriented x 3, elderly/ill appearance HEENT: NCAT, PERRL, EOMI, sclera clear, MMM, large lobulated mass right neck/lower face PULM: CTAB w/o wheezes/crackles, normal respiratory effort, on room air CV: RRR w/o M/G/R GI: abd soft, NTND, NABS, no R/G/M MSK: + Bilateral  lower extremity peripheral edema (right greater than left), moves all EXTR independently NEURO: No focal neurological deficit PSYCH: normal mood/affect Integumentary: Chronic venous changes bilateral lower extremities with mild erythema RLE (improved), otherwise no other concerning rashes/lesions/wounds noted on exposed skin surfaces     Data Reviewed: I have personally reviewed following labs and imaging studies  CBC: Recent Labs  Lab 10/16/23 0700 10/18/23 0205 10/19/23 0242 10/20/23 0552 10/21/23 0752  WBC 4.9 9.5 12.6* 6.7 5.5  NEUTROABS  --  8.7*  --   --   --   HGB 10.6* 11.7* 9.3* 9.8* 10.2*  HCT 34.6* 36.5* 30.3* 31.1* 33.6*  MCV 88.3 87.3 89.6 89.1 90.3  PLT 260 235 199 177 180   Basic Metabolic Panel: Recent Labs  Lab 10/16/23 0700 10/18/23 0205 10/19/23 0242 10/19/23 1606 10/20/23 0552 10/21/23 0752  NA 139 141 139 138 139 137  K 4.3 3.3* 3.2* 3.7 3.6 3.8  CL 101 101 108 104 104 101  CO2 27 26 23 27 25 25   GLUCOSE 157* 99 122* 98 88 86  BUN 22 35* 27* 22 19 17   CREATININE 0.72 0.77 0.85 0.94 0.81 0.84  CALCIUM 8.9 9.5 8.2* 8.3* 7.9* 8.2*  MG 1.9  --  1.6* 2.4 2.3 2.1  PHOS  --   --  1.7* 2.3* 1.9*  --    GFR: Estimated Creatinine Clearance: 57 mL/min (by C-G formula based on SCr of 0.84 mg/dL). Liver Function Tests: No results for input(s): AST, ALT, ALKPHOS, BILITOT, PROT, ALBUMIN in the last 168 hours.  No results for input(s): LIPASE, AMYLASE in the last 168 hours. No results for input(s): AMMONIA in the last 168 hours. Coagulation Profile: Recent Labs  Lab 10/15/23 0653 10/18/23 1826  INR 1.1 1.2   Cardiac Enzymes: No results for input(s): CKTOTAL, CKMB, CKMBINDEX, TROPONINI in the last 168 hours. BNP (last 3 results) No results for input(s): PROBNP in the last 8760 hours. HbA1C: No results for input(s): HGBA1C in the last 72 hours. CBG: No results for input(s): GLUCAP in the last 168 hours. Lipid  Profile: No results for input(s): CHOL, HDL, LDLCALC, TRIG, CHOLHDL, LDLDIRECT in the last 72 hours. Thyroid  Function Tests: No results for input(s): TSH, T4TOTAL, FREET4, T3FREE, THYROIDAB in the last 72 hours. Anemia Panel: No results for input(s): VITAMINB12, FOLATE, FERRITIN, TIBC, IRON, RETICCTPCT in the last 72 hours. Sepsis Labs: Recent Labs  Lab 10/18/23 0630 10/18/23 0748 10/18/23 0947 10/19/23 0242  PROCALCITON  --   --   --  2.63  LATICACIDVEN 13.4* 2.6* 2.2* 1.7    Recent Results (from the past 240 hours)  Blood culture (routine x 2)     Status: Abnormal   Collection Time: 10/18/23  2:05 AM   Specimen: BLOOD RIGHT ARM  Result Value Ref Range Status   Specimen Description   Final    BLOOD RIGHT ARM Performed at [facility], [address] [place], [place] [address]    Special Requests   Final    BOTTLES DRAWN AEROBIC AND  ANAEROBIC Blood Culture results may not be optimal due to an inadequate volume of blood received in culture bottles Performed at [facility], [address], [place], [place] [address]    Culture  Setup Time   Final    GRAM POSITIVE COCCI IN CHAINS AEROBIC BOTTLE ONLY CRITICAL RESULT CALLED TO, READ BACK BY AND VERIFIED WITH: MAYA DAMIEN SQUIBB [id number] FCP Performed at [facility], [address] [place], [place] [address]    Culture GROUP B STREP(S.AGALACTIAE)ISOLATED (A)  Final   Report Status 10/21/2023 FINAL  Final   Organism ID, Bacteria GROUP B STREP(S.AGALACTIAE)ISOLATED  Final      Susceptibility   Group b strep(s.agalactiae)isolated - MIC*    CLINDAMYCIN >=1 RESISTANT Resistant     AMPICILLIN <=0.25 SENSITIVE Sensitive     ERYTHROMYCIN >=8 RESISTANT Resistant     VANCOMYCIN  0.5 SENSITIVE Sensitive     CEFTRIAXONE  <=0.12 SENSITIVE Sensitive     LEVOFLOXACIN 4 INTERMEDIATE Intermediate     PENICILLIN  <=0.06 SENSITIVE Sensitive     * GROUP B  STREP(S.AGALACTIAE)ISOLATED  Blood culture (routine x 2)     Status: None (Preliminary result)   Collection Time: 10/18/23  2:05 AM   Specimen: BLOOD LEFT FOREARM  Result Value Ref Range Status   Specimen Description   Final    BLOOD LEFT FOREARM Performed at [facility], [address] [place], [place] [address]    Special Requests   Final    BOTTLES DRAWN AEROBIC AND ANAEROBIC Blood Culture results may not be optimal due to an inadequate volume of blood received in culture bottles Performed at [facility], [address], [place], [place] [address]    Culture   Final    NO GROWTH 3 DAYS Performed at [facility], [address], [place], [place] [address]    Report Status PENDING  Incomplete  Blood Culture ID Panel (Reflexed)     Status: Abnormal   Collection Time: 10/18/23  2:05 AM  Result Value Ref Range Status   Enterococcus faecalis NOT DETECTED NOT DETECTED Final   Enterococcus Faecium NOT DETECTED NOT DETECTED Final   Listeria monocytogenes NOT DETECTED NOT DETECTED Final   Staphylococcus species NOT DETECTED NOT DETECTED Final   Staphylococcus aureus (BCID) NOT DETECTED NOT DETECTED Final   Staphylococcus epidermidis NOT DETECTED NOT DETECTED Final   Staphylococcus lugdunensis NOT DETECTED NOT DETECTED Final   Streptococcus species DETECTED (A) NOT DETECTED Final    Comment: CRITICAL RESULT CALLED TO, READ BACK BY AND VERIFIED WITH: PHARMD LEANNE P [id number] FCP    Streptococcus agalactiae DETECTED (A) NOT DETECTED Final    Comment: CRITICAL RESULT CALLED TO, READ BACK BY AND VERIFIED WITH: PHARMD LEANNE P [id number] FCP    Streptococcus pneumoniae NOT DETECTED NOT DETECTED Final   Streptococcus pyogenes NOT DETECTED NOT DETECTED Final   A.calcoaceticus-baumannii NOT DETECTED NOT DETECTED Final   Bacteroides fragilis NOT DETECTED NOT DETECTED Final   Enterobacterales NOT DETECTED NOT DETECTED Final   Enterobacter cloacae complex  NOT DETECTED NOT DETECTED Final   Escherichia coli NOT DETECTED NOT DETECTED Final   Klebsiella aerogenes NOT DETECTED NOT DETECTED Final   Klebsiella oxytoca NOT DETECTED NOT DETECTED Final   Klebsiella pneumoniae NOT DETECTED NOT DETECTED Final   Proteus species NOT DETECTED NOT DETECTED Final   Salmonella species NOT DETECTED NOT DETECTED Final   Serratia marcescens NOT DETECTED NOT DETECTED Final   Haemophilus influenzae  NOT DETECTED NOT DETECTED Final   Neisseria meningitidis NOT DETECTED NOT DETECTED Final   Pseudomonas aeruginosa NOT DETECTED NOT DETECTED Final   Stenotrophomonas maltophilia NOT DETECTED NOT DETECTED Final   Candida albicans NOT DETECTED NOT DETECTED Final   Candida auris NOT DETECTED NOT DETECTED Final   Candida glabrata NOT DETECTED NOT DETECTED Final   Candida krusei NOT DETECTED NOT DETECTED Final   Candida parapsilosis NOT DETECTED NOT DETECTED Final   Candida tropicalis NOT DETECTED NOT DETECTED Final   Cryptococcus neoformans/gattii NOT DETECTED NOT DETECTED Final    Comment: Performed at [facility], [address] [place], [place] [address]  Resp panel by RT-PCR (RSV, Flu A&B, Covid) Anterior Nasal Swab     Status: None   Collection Time: 10/18/23  2:26 AM   Specimen: Anterior Nasal Swab  Result Value Ref Range Status   SARS Coronavirus 2 by RT PCR NEGATIVE NEGATIVE Final    Comment: ([name]) SARS-CoV-2 target nucleic acids are NOT DETECTED.  The SARS-CoV-2 RNA is generally detectable in upper respiratory specimens during the acute phase of infection. The lowest concentration of SARS-CoV-2 viral copies this assay can detect is 138 copies/mL. A negative result does not preclude SARS-Cov-2 infection and should not be used as the sole basis for treatment or other patient management decisions. A negative result may occur with  improper specimen collection/handling, submission of specimen other than nasopharyngeal swab, presence of viral  mutation(s) within the areas targeted by this assay, and inadequate number of viral copies(<138 copies/mL). A negative result must be combined with clinical observations, patient history, and epidemiological information. The expected result is Negative.  Fact Sheet for Patients:  BloggerCourse.com  Fact Sheet for Healthcare Providers:  SeriousBroker.it  This test is no t yet approved or cleared by the [place]  FDA and  has been authorized for detection and/or diagnosis of SARS-CoV-2 by FDA under an Emergency Use Authorization (EUA). This EUA will remain  in effect (meaning this test can be used) for the duration of the COVID-19 declaration under Section 564(b)(1) of the Act, 21 U.S.C.section 360bbb-3(b)(1), unless the authorization is terminated  or revoked sooner.       Influenza A by PCR NEGATIVE NEGATIVE Final   Influenza B by PCR NEGATIVE NEGATIVE Final    Comment: ([name]) The Xpert Xpress SARS-CoV-2/FLU/RSV plus assay is intended as an aid in the diagnosis of influenza from Nasopharyngeal swab specimens and should not be used as a sole basis for treatment. Nasal washings and aspirates are unacceptable for Xpert Xpress SARS-CoV-2/FLU/RSV testing.  Fact Sheet for Patients: BloggerCourse.com  Fact Sheet for Healthcare Providers: SeriousBroker.it  This test is not yet approved or cleared by the [place]  FDA and has been authorized for detection and/or diagnosis of SARS-CoV-2 by FDA under an Emergency Use Authorization (EUA). This EUA will remain in effect (meaning this test can be used) for the duration of the COVID-19 declaration under Section 564(b)(1) of the Act, 21 U.S.C. section 360bbb-3(b)(1), unless the authorization is terminated or revoked.     Resp Syncytial Virus by PCR NEGATIVE NEGATIVE Final    Comment: ([name]) Fact Sheet for  Patients: BloggerCourse.com  Fact Sheet for Healthcare Providers: SeriousBroker.it  This test is not yet approved or cleared by the [place]  FDA and has been authorized for detection and/or diagnosis of SARS-CoV-2 by FDA under an Emergency Use Authorization (EUA). This EUA will remain in effect (meaning this test can be used) for the duration of the COVID-19  declaration under Section 564(b)(1) of the Act, 21 U.S.C. section 360bbb-3(b)(1), unless the authorization is terminated or revoked.  Performed at [facility], [address] [place], [place] [address]   Urine Culture     Status: None   Collection Time: 10/18/23  6:16 AM   Specimen: Urine, Clean Catch  Result Value Ref Range Status   Specimen Description   Final    URINE, CLEAN CATCH Performed at [facility], [address], [place], [place] [address]    Special Requests   Final    NONE Performed at [facility], [address], [place], [place] [address]    Culture   Final    NO GROWTH Performed at [facility], [address] [place], [place] [address]    Report Status 10/19/2023 FINAL  Final  MRSA Next Gen by PCR, Nasal     Status: None   Collection Time: 10/18/23  9:19 AM   Specimen: Nasal Mucosa; Nasal Swab  Result Value Ref Range Status   MRSA by PCR Next Gen NOT DETECTED NOT DETECTED Final    Comment: ([name]) The GeneXpert MRSA Assay (FDA approved for NASAL specimens only), is one component of a comprehensive MRSA colonization surveillance program. It is not intended to diagnose MRSA infection nor to guide or monitor treatment for MRSA infections. Test performance is not FDA approved in patients less than 29 years old. Performed at [facility], [address], [place], [place] [address]   Culture, blood (Routine X 2) w Reflex to ID Panel     Status: None (Preliminary  result)   Collection Time: 10/19/23  2:42 AM   Specimen: BLOOD LEFT HAND  Result Value Ref Range Status   Specimen Description BLOOD LEFT HAND  Final   Special Requests   Final    BOTTLES DRAWN AEROBIC ONLY Blood Culture results may not be optimal due to an inadequate volume of blood received in culture bottles   Culture   Final    NO GROWTH 2 DAYS Performed at [facility], [address], [place], [place] [address]    Report Status PENDING  Incomplete  Culture, blood (Routine X 2) w Reflex to ID Panel     Status: None (Preliminary result)   Collection Time: 10/19/23  2:42 AM   Specimen: BLOOD LEFT ARM  Result Value Ref Range Status   Specimen Description BLOOD LEFT ARM  Final   Special Requests   Final    BOTTLES DRAWN AEROBIC ONLY Blood Culture results may not be optimal due to an inadequate volume of blood received in culture bottles   Culture   Final    NO GROWTH 2 DAYS Performed at [facility], [address], [place], [place] [address]    Report Status PENDING  Incomplete         Radiology Studies: CT SOFT TISSUE NECK W CONTRAST Result Date: 10/20/2023 CLINICAL DATA:  Right neck mass EXAM: CT NECK WITH CONTRAST TECHNIQUE: Multidetector CT imaging of the neck was performed using the standard protocol following the bolus administration of intravenous contrast. RADIATION DOSE REDUCTION: This exam was performed according to the departmental dose-optimization program which includes automated exposure control, adjustment of the mA and/or kV according to patient size and/or use of iterative reconstruction technique. CONTRAST:  75mL OMNIPAQUE  IOHEXOL  300 MG/ML  SOLN COMPARISON:  10/14/2023 FINDINGS: Again noted is a large mass superficial to the mandible in the  right-side of the neck. There is no hemorrhage. Pharynx: The nasopharynx, oropharynx and hypopharynx are normal Oral cavity/floor of mouth: Normal Larynx: Normal Salivary glands: The parotid and submandibular glands  are normal Thyroid : Normal Lymph nodes: No adenopathy Vascular: No significant abnormality Limited intracranial: No significant abnormality Visualized orbits: No significant abnormality Mastoids and visualized paranasal sinuses: No significant abnormality Skeleton: No significant abnormality Upper chest: No significant abnormality Other: None IMPRESSION: Approximally 11.6 by 4 x 11 cm mass in the right side of the face/neck unchanged from the prior study. No hemorrhage. Electronically Signed   By: Nancyann Burns M.D.   On: 10/20/2023 15:08   ECHOCARDIOGRAM COMPLETE Result Date: 10/19/2023    ECHOCARDIOGRAM REPORT   Patient Name:   CHRIST FULLENWIDER Date of Exam: 10/19/2023 Medical Rec #:  [id number]      Height:       66.0 in Accession #:    [id number]     Weight:       153.9 lb Date of Birth:  [birth year]      BSA:          1.789 m Patient Age:    88 years       BP:           111/48 mmHg Patient Gender: M              HR:           69 bpm. Exam Location:  Inpatient Procedure: 2D Echo, Cardiac Doppler, Color Doppler, Strain Analysis and 3D Echo            (Both Spectral and Color Flow Doppler were utilized during            procedure). Indications:    Shock  History:        Patient has prior history of Echocardiogram examinations, most                 recent 10/15/2023. CAD and Previous Myocardial Infarction, Mitral                 Valve Disease and Aortic Valve Disease; Risk                 Factors:Hypertension and Dyslipidemia.  Sonographer:    Therisa Crouch Referring Phys: [id number] ROBERT S BYRUM IMPRESSIONS  1. No new RWMA;s since TTE done 10/15/23. Left ventricular ejection fraction, by estimation, is 60 to 65%. The left ventricle has normal function. The left ventricle has no regional wall motion abnormalities. Left ventricular diastolic parameters are consistent with Grade II diastolic dysfunction (pseudonormalization). Elevated left ventricular end-diastolic pressure. The average left ventricular global longitudinal  strain is -19.9 %. The global longitudinal strain is normal.  2. Right ventricular systolic function is normal. The right ventricular size is normal.  3. Right atrial size was mildly dilated.  4. The mitral valve is degenerative. Trivial mitral valve regurgitation. Mild mitral stenosis. Moderate mitral annular calcification.  5. Tricuspid valve regurgitation is moderate.  6. The aortic valve is tricuspid. There is moderate calcification of the aortic valve. There is moderate thickening of the aortic valve. Aortic valve regurgitation is not visualized. Mild to moderate aortic valve stenosis.  7. The inferior vena cava is normal in size with greater than 50% respiratory variability, suggesting right atrial pressure of 3 mmHg. FINDINGS  Left Ventricle: No new RWMA;s since TTE done 10/15/23. Left ventricular ejection fraction, by estimation, is 60 to 65%. The left ventricle has normal function. The  left ventricle has no regional wall motion abnormalities. The average left ventricular global longitudinal strain is -19.9 %. Strain was performed and the global longitudinal strain is normal. The left ventricular internal cavity size was normal in size. There is no left ventricular hypertrophy. Left ventricular diastolic parameters are consistent with Grade II diastolic dysfunction (pseudonormalization). Elevated left ventricular end-diastolic pressure. Right Ventricle: The right ventricular size is normal. No increase in right ventricular wall thickness. Right ventricular systolic function is normal. Left Atrium: Left atrial size was normal in size. Right Atrium: Right atrial size was mildly dilated. Pericardium: There is no evidence of pericardial effusion. Mitral Valve: The mitral valve is degenerative in appearance. Moderate mitral annular calcification. Trivial mitral valve regurgitation. Mild mitral valve stenosis. MV peak gradient, 8.9 mmHg. The mean mitral valve gradient is 5.0 mmHg. Tricuspid Valve: The tricuspid  valve is normal in structure. Tricuspid valve regurgitation is moderate . No evidence of tricuspid stenosis. Aortic Valve: The aortic valve is tricuspid. There is moderate calcification of the aortic valve. There is moderate thickening of the aortic valve. Aortic valve regurgitation is not visualized. Mild to moderate aortic stenosis is present. Aortic valve mean gradient measures 11.7 mmHg. Aortic valve peak gradient measures 20.4 mmHg. Aortic valve area, by VTI measures 1.32 cm. Pulmonic Valve: The pulmonic valve was normal in structure. Pulmonic valve regurgitation is trivial. No evidence of pulmonic stenosis. Aorta: The aortic root is normal in size and structure. Venous: The inferior vena cava is normal in size with greater than 50% respiratory variability, suggesting right atrial pressure of 3 mmHg. IAS/Shunts: No atrial level shunt detected by color flow Doppler. Additional Comments: 3D was performed not requiring image post processing on an independent workstation and was indeterminate.  LEFT VENTRICLE PLAX 2D LVIDd:         3.80 cm   Diastology LVIDs:         2.50 cm   LV e' medial:    6.64 cm/s LV PW:         0.90 cm   LV E/e' medial:  17.3 LV IVS:        0.90 cm   LV e' lateral:   7.83 cm/s LVOT diam:     2.00 cm   LV E/e' lateral: 14.7 LV SV:         66 LV SV Index:   37        2D Longitudinal Strain LVOT Area:     3.14 cm  2D Strain GLS Avg:     -19.9 %                           3D Volume EF                          LV 3D EDV:   103.42 ml                          LV 3D ESV:   39.24 ml RIGHT VENTRICLE             IVC RV Basal diam:  3.60 cm     IVC diam: 3.00 cm RV S prime:     13.30 cm/s TAPSE (M-mode): 2.4 cm LEFT ATRIUM             Index        RIGHT ATRIUM  Index LA diam:        3.60 cm 2.01 cm/m   RA Area:     20.60 cm LA Vol (A2C):   77.6 ml 43.38 ml/m  RA Volume:   55.40 ml  30.97 ml/m LA Vol (A4C):   75.6 ml 42.26 ml/m LA Biplane Vol: 77.3 ml 43.21 ml/m  AORTIC VALVE AV Area  (Vmax):    1.27 cm AV Area (Vmean):   1.23 cm AV Area (VTI):     1.32 cm AV Vmax:           226.00 cm/s AV Vmean:          158.333 cm/s AV VTI:            0.505 m AV Peak Grad:      20.4 mmHg AV Mean Grad:      11.7 mmHg LVOT Vmax:         91.05 cm/s LVOT Vmean:        62.100 cm/s LVOT VTI:          0.212 m LVOT/AV VTI ratio: 0.42  AORTA Ao Root diam: 3.50 cm Ao Asc diam:  3.20 cm MITRAL VALVE                TRICUSPID VALVE MV Area (PHT): 3.17 cm     TR Peak grad:   34.8 mmHg MV Area VTI:   1.32 cm     TR Vmax:        295.00 cm/s MV Peak grad:  8.9 mmHg MV Mean grad:  5.0 mmHg     SHUNTS MV Vmax:       1.49 m/s     Systemic VTI:  0.21 m MV Vmean:      106.5 cm/s   Systemic Diam: 2.00 cm MV Decel Time: 239 msec MV E velocity: 115.00 cm/s MV A velocity: 130.00 cm/s MV E/A ratio:  0.88 Maude Emmer MD Electronically signed by Maude Emmer MD Signature Date/Time: 10/19/2023/10:56:55 AM    Final         Scheduled Meds:  acyclovir   400 mg Oral Daily   allopurinol   100 mg Oral BID   aspirin  EC  81 mg Oral Daily   atenolol   12.5 mg Oral Daily   brimonidine   1 drop Right Eye BID   Chlorhexidine  Gluconate Cloth  6 each Topical Daily   cholecalciferol   1,000 Units Oral Daily   cyanocobalamin   2,500 mcg Oral Daily   finasteride   5 mg Oral Daily   furosemide   40 mg Intravenous Daily   pantoprazole   40 mg Oral Daily   pravastatin   40 mg Oral QHS   sodium chloride  flush  10-40 mL Intracatheter Q12H   Continuous Infusions:  heparin  1,550 Units/hr (10/21/23 0616)   penicillin  G potassium 24 Million Units in dextrose  5 % 500 mL CONTINUOUS infusion 20.8 mL/hr at 10/21/23 0534     LOS: 3 days    Time spent: 52 minutes spent on 10/21/2023 caring for this patient face-to-face including chart review, ordering labs/tests, documenting, discussion with nursing staff, consultants, updating family and interview/physical exam    Camellia PARAS [place], DO [facility] Available via Epic secure chat  7am-7pm After these hours, please refer to coverage provider listed on [email or web address] 10/21/2023, 9:35 AM

## 2023-10-21 NOTE — Progress Notes (Cosign Needed)
 Harold [name]   DOB:December 01, 1934   FM#:[id number]      ASSESSMENT & PLAN:  Harold [name]. Harold [name]  is an 89 year old male patient with oncologic history significant for follicular lymphoma.  Patient was discharged from inpatient on 10/16/23 and was scheduled to start chemotherapy 10/20/22.  Readmitted on 10/18/23 with complaints of chest pain, elevated troponin, bacteremia.    Septic shock Bacteremia RLE cellulitis -Patient admitted on 10/18/2023 with fever and weakness. - Blood cultures positive for strep on 8/2.  Repeat 8/3 with NGTD. Neg for MRSA. Urine cx neg. Resp panel neg.   -- Continue IV antibiotics as ordered.   NSTEMI History of CAD - Patient with elevated troponin, decreasing - Denies further chest pain, states he has not had any since admission.   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 Likely progression to high-grade. --LN biopsy of right axilla done 6/11, showed low grade FL. - Pending path of facial mass biopsy done 10/15/2023.  -- CT neck done 7/29 shows very large up to 12 cm lymphomatous mass about the right mandible, no other lymphadenopathy in the neck or thoracic inlet.  --Port placed 7/30 for chemotherapy -->per ID hold off on use at this time. -Chemotherapy R-CEOP was scheduled as outpatient 8/4. - Will plan chemotherapy possibly as inpatient when cleared for administration by Cardio and ID. - Medical oncology/Dr. [name] will make further treatment recommendations.   Anemia, normocytic - Mild - Hemoglobin stable 10.2 -No transfusional intervention required at this time - Continue to monitor CBC with differential   History of melanoma - On chin - Status post resection   History of head and neck basal cell carcinoma - Continue close follow-up with Derm  History of DVT - LLE, age  indeterminate -Patient reports being on Eliquis  on and off for several years - IV heparin  infusing well, agree with plan to transition to DOAC   Code Status DNR-limited  Subjective:  Patient seen awake and alert sitting up in chair at bedside.  Patient's daughter and son-in-law both at bedside.  Denies chest pain, shortness of breath, states he has not had any since admission.  Reports anxious to get started on his chemo and wants to know when it will be started.  He does complain of right lower extremity redness.  No other acute complaints.  Objective:   Intake/Output Summary (Last 24 hours) at 10/21/2023 1035 Last data filed at 10/21/2023 1019 Gross per 24 hour  Intake 1854.63 ml  Output 3300 ml  Net -1445.37 ml     PHYSICAL EXAMINATION: ECOG PERFORMANCE STATUS: 2 - Symptomatic, <50% confined to bed  Vitals:   10/21/23 0800 10/21/23 0937  BP:  (!) 117/41  Pulse:  70  Resp:    Temp: 98.1 F (36.7 C)   SpO2:     Filed Weights   10/20/23 0500 10/20/23 0600 10/21/23 0500  Weight: 152 lb 5.4 oz (69.1 kg) 141 lb 1.5 oz (64 kg) 142 lb 10.2 oz (64.7 kg)    GENERAL: alert, no distress and comfortable +right facial mass SKIN: +RLE erythema and 1+edema, skin color, texture, turgor are normal, no rashes or significant lesions EYES: normal, conjunctiva are pink and non-injected, sclera clear OROPHARYNX: no exudate, no erythema and lips, buccal mucosa, and tongue normal  NECK: supple, thyroid  normal size, non-tender, without nodularity LYMPH: no palpable lymphadenopathy in the cervical, axillary or  inguinal LUNGS: clear to auscultation and percussion with normal breathing effort HEART: regular rate & rhythm and no murmurs and no lower extremity edema ABDOMEN: abdomen soft, non-tender and normal bowel sounds MUSCULOSKELETAL: no cyanosis of digits and no clubbing  PSYCH: alert & oriented x 3 with fluent speech NEURO: no focal motor/sensory deficits   All questions were answered.  The patient knows to call the clinic with any problems, questions or concerns.   The total time spent in the appointment was 40 minutes encounter with patient including review of chart and various tests results, discussions about plan of care and coordination of care plan  [name], NP 10/21/2023 10:35 AM    Labs Reviewed:  Lab Results  Component Value Date   WBC 5.5 10/21/2023   HGB 10.2 (L) 10/21/2023   HCT 33.6 (L) 10/21/2023   MCV 90.3 10/21/2023   PLT 180 10/21/2023   Recent Labs    09/15/23 0931 09/29/23 1120 10/13/23 1212 10/16/23 0700 10/19/23 1606 10/20/23 0552 10/21/23 0752  NA 145 140 143   < > 138 139 137  K 3.3* 4.0 3.6   < > 3.7 3.6 3.8  CL 104 103 103   < > 104 104 101  CO2 32 32 35*   < > 27 25 25   GLUCOSE 96 88 85   < > 98 88 86  BUN 29* 18 22   < > 22 19 17   CREATININE 1.13 0.96 1.00   < > 0.94 0.81 0.84  CALCIUM 9.7 8.9 8.9   < > 8.3* 7.9* 8.2*  GFRNONAA >60 >60 >60   < > >60 >60 >60  PROT 6.9 5.9* 6.1*  --   --   --   --   ALBUMIN 4.3 3.6 3.6  --   --   --   --   AST 14* 22 15  --   --   --   --   ALT 12 16 10   --   --   --   --   ALKPHOS 61 51 53  --   --   --   --   BILITOT 0.8 0.7 0.5  --   --   --   --    < > = values in this interval not displayed.    Studies Reviewed:  CT SOFT TISSUE NECK W CONTRAST Result Date: 10/20/2023 CLINICAL DATA:  Right neck mass EXAM: CT NECK WITH CONTRAST TECHNIQUE: Multidetector CT imaging of the neck was performed using the standard protocol following the bolus administration of intravenous contrast. RADIATION DOSE REDUCTION: This exam was performed according to the departmental dose-optimization program which includes automated exposure control, adjustment of the mA and/or kV according to patient size and/or use of iterative reconstruction technique. CONTRAST:  75mL OMNIPAQUE  IOHEXOL  300 MG/ML  SOLN COMPARISON:  10/14/2023 FINDINGS: Again noted is a large mass superficial to the mandible in the right-side of the  neck. There is no hemorrhage. Pharynx: The nasopharynx, oropharynx and hypopharynx are normal Oral cavity/floor of mouth: Normal Larynx: Normal Salivary glands: The parotid and submandibular glands are normal Thyroid : Normal Lymph nodes: No adenopathy Vascular: No significant abnormality Limited intracranial: No significant abnormality Visualized orbits: No significant abnormality Mastoids and visualized paranasal sinuses: No significant abnormality Skeleton: No significant abnormality Upper chest: No significant abnormality Other: None IMPRESSION: Approximally 11.6 by 4 x 11 cm mass in the right side of the face/neck unchanged from the prior study. No hemorrhage. Electronically Signed  By: Nancyann Burns M.D.   On: 10/20/2023 15:08   VAS US  LOWER EXTREMITY VENOUS (DVT) Result Date: 10/19/2023  Lower Venous DVT Study Patient Name:  Harold [name]  Date of Exam:   10/18/2023 Medical Rec #: [id number]       Accession #:    [id number] Date of Birth: Aug 14, 1934       Patient Gender: M Patient Age:   41 years Exam Location:  [facility] Procedure:      VAS US  LOWER EXTREMITY VENOUS (DVT) Referring Phys: LAMAR CHRIS --------------------------------------------------------------------------------  Indications: Right > L Edema.  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 Chronic HFpEF. Limitations: Pitting edema, involuntary patient movement. Comparison Study: Prior bilateral LEV done 05/05/20 indicating chronic left                   peroneal DVT Performing Technologist: Alberta Lis RVS  Examination Guidelines: A complete evaluation includes B-mode imaging, spectral Doppler, color Doppler, and power Doppler as needed of all accessible portions of each vessel. Bilateral testing is considered an integral part of a complete examination. Limited examinations for reoccurring indications may be performed as noted. The reflux portion of the  exam is performed with the patient in reverse Trendelenburg.  +---------+---------------+---------+-----------+----------+--------------+ RIGHT    CompressibilityPhasicitySpontaneityPropertiesThrombus Aging +---------+---------------+---------+-----------+----------+--------------+ CFV      Full           Yes      No                                  +---------+---------------+---------+-----------+----------+--------------+ SFJ      Full                                                        +---------+---------------+---------+-----------+----------+--------------+ FV Prox  Full           Yes      No                                  +---------+---------------+---------+-----------+----------+--------------+ FV Mid   Full                                                        +---------+---------------+---------+-----------+----------+--------------+ FV DistalFull                                                        +---------+---------------+---------+-----------+----------+--------------+ PFV      Full           Yes      No                                  +---------+---------------+---------+-----------+----------+--------------+ POP      Full  Yes      No                                  +---------+---------------+---------+-----------+----------+--------------+ PTV      Full                                                        +---------+---------------+---------+-----------+----------+--------------+ PERO     Full                                                        +---------+---------------+---------+-----------+----------+--------------+   +---------+---------------+---------+-----------+----------+-------------------+ LEFT     CompressibilityPhasicitySpontaneityPropertiesThrombus Aging      +---------+---------------+---------+-----------+----------+-------------------+ CFV      Full           Yes      No                                        +---------+---------------+---------+-----------+----------+-------------------+ SFJ      Full                                                             +---------+---------------+---------+-----------+----------+-------------------+ FV Prox  Full           Yes      No                                       +---------+---------------+---------+-----------+----------+-------------------+ FV Mid   Full                                                             +---------+---------------+---------+-----------+----------+-------------------+ FV DistalFull           Yes      No                                       +---------+---------------+---------+-----------+----------+-------------------+ PFV      Full                                                             +---------+---------------+---------+-----------+----------+-------------------+ POP                     Yes      No  patent by color and                                                       Doppler             +---------+---------------+---------+-----------+----------+-------------------+ PTV      None                                         Age Indeterminate   +---------+---------------+---------+-----------+----------+-------------------+ PERO     None                                         Age Indeterminate   +---------+---------------+---------+-----------+----------+-------------------+     Summary: RIGHT: - There is no evidence of deep vein thrombosis in the lower extremity.  Interstitial edema noted throughout the extremity  LEFT: - Findings consistent with age indeterminate deep vein thrombosis involving the left posterior tibial veins, and left peroneal veins.  Interstitial edema noted throughout the extremity.  *See table(s) above for measurements and observations. Electronically signed by Debby Robertson on 10/19/2023 at 3:36:23 PM.     Final    ECHOCARDIOGRAM COMPLETE Result Date: 10/19/2023    ECHOCARDIOGRAM REPORT   Patient Name:   Harold [name] Date of Exam: 10/19/2023 Medical Rec #:  [id number]      Height:       66.0 in Accession #:    [id number]     Weight:       153.9 lb Date of Birth:  [birth year]      BSA:          1.789 m Patient Age:    88 years       BP:           111/48 mmHg Patient Gender: M              HR:           69 bpm. Exam Location:  Inpatient Procedure: 2D Echo, Cardiac Doppler, Color Doppler, Strain Analysis and 3D Echo            (Both Spectral and Color Flow Doppler were utilized during            procedure). Indications:    Shock  History:        Patient has prior history of Echocardiogram examinations, most                 recent 10/15/2023. CAD and Previous Myocardial Infarction, Mitral                 Valve Disease and Aortic Valve Disease; Risk                 Factors:Hypertension and Dyslipidemia.  Sonographer:    Therisa Crouch Referring Phys: [id number] ROBERT S BYRUM IMPRESSIONS  1. No new RWMA;s since TTE done 10/15/23. Left ventricular ejection fraction, by estimation, is 60 to 65%. The left ventricle has normal function. The left ventricle has no regional wall motion abnormalities. Left ventricular diastolic parameters are consistent with Grade II diastolic dysfunction (pseudonormalization). Elevated left ventricular end-diastolic pressure. The average left ventricular global longitudinal  strain is -19.9 %. The global longitudinal strain is normal.  2. Right ventricular systolic function is normal. The right ventricular size is normal.  3. Right atrial size was mildly dilated.  4. The mitral valve is degenerative. Trivial mitral valve regurgitation. Mild mitral stenosis. Moderate mitral annular calcification.  5. Tricuspid valve regurgitation is moderate.  6. The aortic valve is tricuspid. There is moderate calcification of the aortic valve. There is moderate thickening of the aortic valve. Aortic valve regurgitation  is not visualized. Mild to moderate aortic valve stenosis.  7. The inferior vena cava is normal in size with greater than 50% respiratory variability, suggesting right atrial pressure of 3 mmHg. FINDINGS  Left Ventricle: No new RWMA;s since TTE done 10/15/23. Left ventricular ejection fraction, by estimation, is 60 to 65%. The left ventricle has normal function. The left ventricle has no regional wall motion abnormalities. The average left ventricular global longitudinal strain is -19.9 %. Strain was performed and the global longitudinal strain is normal. The left ventricular internal cavity size was normal in size. There is no left ventricular hypertrophy. Left ventricular diastolic parameters are consistent with Grade II diastolic dysfunction (pseudonormalization). Elevated left ventricular end-diastolic pressure. Right Ventricle: The right ventricular size is normal. No increase in right ventricular wall thickness. Right ventricular systolic function is normal. Left Atrium: Left atrial size was normal in size. Right Atrium: Right atrial size was mildly dilated. Pericardium: There is no evidence of pericardial effusion. Mitral Valve: The mitral valve is degenerative in appearance. Moderate mitral annular calcification. Trivial mitral valve regurgitation. Mild mitral valve stenosis. MV peak gradient, 8.9 mmHg. The mean mitral valve gradient is 5.0 mmHg. Tricuspid Valve: The tricuspid valve is normal in structure. Tricuspid valve regurgitation is moderate . No evidence of tricuspid stenosis. Aortic Valve: The aortic valve is tricuspid. There is moderate calcification of the aortic valve. There is moderate thickening of the aortic valve. Aortic valve regurgitation is not visualized. Mild to moderate aortic stenosis is present. Aortic valve mean gradient measures 11.7 mmHg. Aortic valve peak gradient measures 20.4 mmHg. Aortic valve area, by VTI measures 1.32 cm. Pulmonic Valve: The pulmonic valve was normal in  structure. Pulmonic valve regurgitation is trivial. No evidence of pulmonic stenosis. Aorta: The aortic root is normal in size and structure. Venous: The inferior vena cava is normal in size with greater than 50% respiratory variability, suggesting right atrial pressure of 3 mmHg. IAS/Shunts: No atrial level shunt detected by color flow Doppler. Additional Comments: 3D was performed not requiring image post processing on an independent workstation and was indeterminate.  LEFT VENTRICLE PLAX 2D LVIDd:         3.80 cm   Diastology LVIDs:         2.50 cm   LV e' medial:    6.64 cm/s LV PW:         0.90 cm   LV E/e' medial:  17.3 LV IVS:        0.90 cm   LV e' lateral:   7.83 cm/s LVOT diam:     2.00 cm   LV E/e' lateral: 14.7 LV SV:         66 LV SV Index:   37        2D Longitudinal Strain LVOT Area:     3.14 cm  2D Strain GLS Avg:     -19.9 %  3D Volume EF                          LV 3D EDV:   103.42 ml                          LV 3D ESV:   39.24 ml RIGHT VENTRICLE             IVC RV Basal diam:  3.60 cm     IVC diam: 3.00 cm RV S prime:     13.30 cm/s TAPSE (M-mode): 2.4 cm LEFT ATRIUM             Index        RIGHT ATRIUM           Index LA diam:        3.60 cm 2.01 cm/m   RA Area:     20.60 cm LA Vol (A2C):   77.6 ml 43.38 ml/m  RA Volume:   55.40 ml  30.97 ml/m LA Vol (A4C):   75.6 ml 42.26 ml/m LA Biplane Vol: 77.3 ml 43.21 ml/m  AORTIC VALVE AV Area (Vmax):    1.27 cm AV Area (Vmean):   1.23 cm AV Area (VTI):     1.32 cm AV Vmax:           226.00 cm/s AV Vmean:          158.333 cm/s AV VTI:            0.505 m AV Peak Grad:      20.4 mmHg AV Mean Grad:      11.7 mmHg LVOT Vmax:         91.05 cm/s LVOT Vmean:        62.100 cm/s LVOT VTI:          0.212 m LVOT/AV VTI ratio: 0.42  AORTA Ao Root diam: 3.50 cm Ao Asc diam:  3.20 cm MITRAL VALVE                TRICUSPID VALVE MV Area (PHT): 3.17 cm     TR Peak grad:   34.8 mmHg MV Area VTI:   1.32 cm     TR Vmax:        295.00 cm/s  MV Peak grad:  8.9 mmHg MV Mean grad:  5.0 mmHg     SHUNTS MV Vmax:       1.49 m/s     Systemic VTI:  0.21 m MV Vmean:      106.5 cm/s   Systemic Diam: 2.00 cm MV Decel Time: 239 msec MV E velocity: 115.00 cm/s MV A velocity: 130.00 cm/s MV E/A ratio:  0.88 Maude Emmer MD Electronically signed by Maude Emmer MD Signature Date/Time: 10/19/2023/10:56:55 AM    Final    DG Chest Port 1 View Result Date: 10/18/2023 EXAM: 1 VIEW XRAY OF THE CHEST 10/18/2023 02:13:00 AM COMPARISON: 01/01/2011 CLINICAL HISTORY: 355200 Chest pain [id number]. Per chart: Patient BIB EMS for chest pain and fever that started last night. Patient was seen here earlier this week and had port implanted for chemotherapy related to Non-Hodgkin's lymphoma. Patient given 324 ASA and 1 inch nitropaste from EMS. Denies any pain at this time. FINDINGS: LUNGS AND PLEURA: No focal pulmonary opacity. No pulmonary edema. No pleural effusion. No pneumothorax. HEART AND MEDIASTINUM: No acute abnormality of the cardiac and mediastinal silhouettes. BONES AND SOFT TISSUES: No acute  osseous abnormality. Right chest wall port-a-cath with tip in the lower SVC. IMPRESSION: 1. No acute process. Electronically signed by: Franky Stanford MD 10/18/2023 02:24 AM EDT RP Workstation: [id number]   ECHOCARDIOGRAM COMPLETE Result Date: 10/15/2023    ECHOCARDIOGRAM REPORT   Patient Name:   Harold [name] Date of Exam: 10/15/2023 Medical Rec #:  [id number]      Height:       66.0 in Accession #:    [id number]     Weight:       155.3 lb Date of Birth:  Mar 27, 1934      BSA:          1.796 m Patient Age:    88 years       BP:           95/50 mmHg Patient Gender: M              HR:           68 bpm. Exam Location:  Inpatient Procedure: 2D Echo, 3D Echo, Strain Analysis, Cardiac Doppler and Color Doppler            (Both Spectral and Color Flow Doppler were utilized during            procedure). Indications:    CAD native vessel I25.10  History:        Patient has prior history of  Echocardiogram examinations, most                 recent 01/29/2023. CAD and Previous Myocardial Infarction,                 Mitral Valve Disease; Risk Factors:Hypertension and                 Dyslipidemia.  Sonographer:    Koleen Popper RDCS Referring Phys: [phone] ANAND D HONGALGI  Sonographer Comments: Global longitudinal strain was attempted. IMPRESSIONS  1. Left ventricular ejection fraction, by estimation, is 60 to 65%. Left ventricular ejection fraction by 3D volume is 62 %. The left ventricle has normal function. The left ventricle has no regional wall motion abnormalities. Left ventricular diastolic  parameters are consistent with Grade II diastolic dysfunction (pseudonormalization). The average left ventricular global longitudinal strain is -19.9 %. The global longitudinal strain is normal.  2. Right ventricular systolic function is mildly reduced. The right ventricular size is mildly enlarged. There is mildly elevated pulmonary artery systolic pressure. The estimated right ventricular systolic pressure is 41.6 mmHg.  3. The mitral valve is normal in structure. Mild mitral valve regurgitation. Mild mitral stenosis. Severe mitral annular calcification.  4. The tricuspid valve is degenerative. Tricuspid valve regurgitation is moderate.  5. The aortic valve has an indeterminant number of cusps. There is severe calcifcation of the aortic valve. Aortic valve regurgitation is mild. Mild aortic valve stenosis. Aortic valve Vmax measures 2.17 m/s.  6. The inferior vena cava is dilated in size with <50% respiratory variability, suggesting right atrial pressure of 15 mmHg. FINDINGS  Left Ventricle: Left ventricular ejection fraction, by estimation, is 60 to 65%. Left ventricular ejection fraction by 3D volume is 62 %. The left ventricle has normal function. The left ventricle has no regional wall motion abnormalities. The average left ventricular global longitudinal strain is -19.9 %. Strain was performed and the  global longitudinal strain is normal. The left ventricular internal cavity size was normal in size. There is no left ventricular hypertrophy. Left ventricular diastolic parameters are consistent with Grade II  diastolic dysfunction (pseudonormalization). Right Ventricle: The right ventricular size is mildly enlarged. No increase in right ventricular wall thickness. Right ventricular systolic function is mildly reduced. There is mildly elevated pulmonary artery systolic pressure. The tricuspid regurgitant  velocity is 2.58 m/s, and with an assumed right atrial pressure of 15 mmHg, the estimated right ventricular systolic pressure is 41.6 mmHg. Left Atrium: Left atrial size was normal in size. Right Atrium: Right atrial size was normal in size. Pericardium: There is no evidence of pericardial effusion. Mitral Valve: The mitral valve is normal in structure. Severe mitral annular calcification. Mild mitral valve regurgitation. Mild mitral valve stenosis. MV peak gradient, 12.7 mmHg. The mean mitral valve gradient is 4.0 mmHg. Tricuspid Valve: The tricuspid valve is degenerative in appearance. Tricuspid valve regurgitation is moderate . No evidence of tricuspid stenosis. Aortic Valve: The aortic valve has an indeterminant number of cusps. There is severe calcifcation of the aortic valve. Aortic valve regurgitation is mild. Mild aortic stenosis is present. Aortic valve mean gradient measures 11.0 mmHg. Aortic valve peak gradient measures 18.8 mmHg. Aortic valve area, by VTI measures 1.07 cm. Pulmonic Valve: The pulmonic valve was grossly normal. Pulmonic valve regurgitation is mild. No evidence of pulmonic stenosis. Aorta: The aortic root is normal in size and structure. Venous: The inferior vena cava is dilated in size with less than 50% respiratory variability, suggesting right atrial pressure of 15 mmHg. IAS/Shunts: No atrial level shunt detected by color flow Doppler. Additional Comments: 3D was performed not  requiring image post processing on an independent workstation and was normal.  LEFT VENTRICLE PLAX 2D LVIDd:         3.60 cm         Diastology LVIDs:         2.30 cm         LV e' medial:    6.20 cm/s LV PW:         1.00 cm         LV E/e' medial:  18.9 LV IVS:        1.00 cm         LV e' lateral:   5.77 cm/s LVOT diam:     1.50 cm         LV E/e' lateral: 20.3 LV SV:         46 LV SV Index:   26              2D Longitudinal LVOT Area:     1.77 cm        Strain                                2D Strain GLS   -19.9 %                                Avg:                                 3D Volume EF                                LV 3D EF:    Left  ventricul                                             ar                                             ejection                                             fraction                                             by 3D                                             volume is                                             62 %.                                 3D Volume EF:                                3D EF:        62 %                                LV EDV:       93 ml                                LV ESV:       35 ml                                LV SV:        58 ml RIGHT VENTRICLE             IVC RV Basal diam:  4.30 cm     IVC diam: 2.40 cm RV Mid diam:    3.80 cm RV S prime:     14.90 cm/s TAPSE (M-mode): 2.0 cm LEFT ATRIUM             Index        RIGHT ATRIUM           Index LA diam:        3.30 cm 1.84 cm/m   RA Area:     12.80 cm LA Vol (A2C):   37.9 ml 21.10 ml/m  RA Volume:   29.90 ml  16.65 ml/m LA Vol (A4C):   35.3 ml 19.65 ml/m LA Biplane Vol: 39.5 ml 21.99 ml/m  AORTIC VALVE AV Area (Vmax):    1.07 cm AV Area (Vmean):   0.94 cm AV Area (VTI):     1.07 cm AV Vmax:           217.00 cm/s AV Vmean:          157.000 cm/s AV VTI:            0.432 m AV Peak Grad:      18.8 mmHg AV Mean Grad:      11.0 mmHg LVOT Vmax:          131.00 cm/s LVOT Vmean:        83.200 cm/s LVOT VTI:          0.261 m LVOT/AV VTI ratio: 0.60  AORTA Ao Root diam: 2.70 cm Ao Asc diam:  3.30 cm MITRAL VALVE                TRICUSPID VALVE MV Area (PHT): 2.16 cm     TR Peak grad:   26.6 mmHg MV Area VTI:   1.02 cm     TR Vmax:        258.00 cm/s MV Peak grad:  12.7 mmHg MV Mean grad:  4.0 mmHg     SHUNTS MV Vmax:       1.78 m/s     Systemic VTI:  0.26 m MV Vmean:      95.9 cm/s    Systemic Diam: 1.50 cm MV Decel Time: 351 msec MV E velocity: 117.00 cm/s MV A velocity: 149.00 cm/s MV E/A ratio:  0.79 Morene Brownie Electronically signed by Morene Brownie Signature Date/Time: 10/15/2023/6:24:54 PM    Final    IR IMAGING GUIDED PORT INSERTION Result Date: 10/15/2023 INDICATION: 89 year old male with a known history of low-grade follicular lymphoma with enlarging face all mass concerning for transformation to high-grade lymphoma. He presents for port catheter placement to establish durable venous access in preparation for chemotherapy as well as ultrasound-guided core biopsy of the facial mass. EXAM: IMPLANTED PORT A CATH PLACEMENT WITH ULTRASOUND AND FLUOROSCOPIC GUIDANCE ULTRASOUND-GUIDED CORE BIOPSY MEDICATIONS: None. ANESTHESIA/SEDATION: Versed  2 mg IV; Fentanyl  100 mcg IV; administered by the radiology nurse Moderate Sedation Time:  42 minutes The patient's vital signs and level of consciousness were continuously monitored during the procedure by the interventional radiology nurse under my direct supervision. FLUOROSCOPY: Radiation exposure index: 3 mGy reference air kerma COMPLICATIONS: None immediate. PROCEDURE: The right neck and chest was prepped with chlorhexidine , and draped in the usual sterile fashion using maximum barrier technique (cap and mask, sterile gown, sterile gloves, large sterile sheet, hand hygiene and cutaneous antiseptic). Local anesthesia was attained by infiltration with 1% lidocaine  with epinephrine . Ultrasound demonstrated patency  of the right internal jugular vein, and this was documented with an image. Under real-time ultrasound guidance, this vein was accessed with a 21 gauge micropuncture needle and image documentation was performed. A small dermatotomy was made at the access site with an 11 scalpel. A 0.018 wire was advanced into the SVC and the access needle exchanged for a 10F micropuncture vascular sheath. The 0.018 wire was then removed and a 0.035 wire advanced into the IVC. An appropriate location for the subcutaneous reservoir was selected below the clavicle and an incision was made through the skin and underlying soft tissues. The subcutaneous tissues were then dissected using a combination of blunt and sharp  surgical technique and a pocket was formed. A Bard Clear Vue single lumen power injectable portacatheter was then tunneled through the subcutaneous tissues from the pocket to the dermatotomy and the port reservoir placed within the subcutaneous pocket. The venous access site was then serially dilated and a peel away vascular sheath placed over the wire. The wire was removed and the port catheter advanced into position under fluoroscopic guidance. The catheter tip is positioned in the superior cavoatrial junction. This was documented with a spot image. The portacatheter was then tested and found to flush and aspirate well. The port was flushed with saline followed by 100 units/mL heparinized saline. The pocket was then closed in two layers using first subdermal inverted interrupted absorbable sutures followed by a running subcuticular suture. The epidermis was then sealed with Dermabond. The dermatotomy at the venous access site was also closed with Dermabond. The port catheter was left accessed and sterile bandages were applied. Next, the facial mass was evaluated. The mass was interrogated with ultrasound. A suitable biopsy entry site was identified in the submental space allowing passage through relatively normal soft  tissues in a region of relatively low vascularity within the mass. The skin was sterilely prepped and draped in the usual fashion using chlorhexidine  skin prep. Local anesthesia was attained by infiltration with 1% lidocaine . A small dermatotomy was made. Under real-time ultrasound guidance, multiple 18 gauge core biopsies were obtained using the Argon automated biopsy device. Biopsy specimens were placed on a moistened Telfa pad and submitted to pathology for further analysis. Post biopsy imaging demonstrates no evidence of active bleeding or other complication. IMPRESSION: Successful placement of a right IJ approach Bard Clear Vue port catheter with ultrasound and fluoroscopic guidance. The catheter is ready for use. Successful ultrasound-guided biopsy of right facial mass. Electronically Signed   By: Wilkie Lent M.D.   On: 10/15/2023 11:35   IR US  Guide Bx Asp/Drain Result Date: 10/15/2023 INDICATION: 89 year old male with a known history of low-grade follicular lymphoma with enlarging face all mass concerning for transformation to high-grade lymphoma. He presents for port catheter placement to establish durable venous access in preparation for chemotherapy as well as ultrasound-guided core biopsy of the facial mass. EXAM: IMPLANTED PORT A CATH PLACEMENT WITH ULTRASOUND AND FLUOROSCOPIC GUIDANCE ULTRASOUND-GUIDED CORE BIOPSY MEDICATIONS: None. ANESTHESIA/SEDATION: Versed  2 mg IV; Fentanyl  100 mcg IV; administered by the radiology nurse Moderate Sedation Time:  42 minutes The patient's vital signs and level of consciousness were continuously monitored during the procedure by the interventional radiology nurse under my direct supervision. FLUOROSCOPY: Radiation exposure index: 3 mGy reference air kerma COMPLICATIONS: None immediate. PROCEDURE: The right neck and chest was prepped with chlorhexidine , and draped in the usual sterile fashion using maximum barrier technique (cap and mask, sterile gown, sterile  gloves, large sterile sheet, hand hygiene and cutaneous antiseptic). Local anesthesia was attained by infiltration with 1% lidocaine  with epinephrine . Ultrasound demonstrated patency of the right internal jugular vein, and this was documented with an image. Under real-time ultrasound guidance, this vein was accessed with a 21 gauge micropuncture needle and image documentation was performed. A small dermatotomy was made at the access site with an 11 scalpel. A 0.018 wire was advanced into the SVC and the access needle exchanged for a 39F micropuncture vascular sheath. The 0.018 wire was then removed and a 0.035 wire advanced into the IVC. An appropriate location for the subcutaneous reservoir was selected below the clavicle and an incision was made through the skin and  underlying soft tissues. The subcutaneous tissues were then dissected using a combination of blunt and sharp surgical technique and a pocket was formed. A Bard Clear Vue single lumen power injectable portacatheter was then tunneled through the subcutaneous tissues from the pocket to the dermatotomy and the port reservoir placed within the subcutaneous pocket. The venous access site was then serially dilated and a peel away vascular sheath placed over the wire. The wire was removed and the port catheter advanced into position under fluoroscopic guidance. The catheter tip is positioned in the superior cavoatrial junction. This was documented with a spot image. The portacatheter was then tested and found to flush and aspirate well. The port was flushed with saline followed by 100 units/mL heparinized saline. The pocket was then closed in two layers using first subdermal inverted interrupted absorbable sutures followed by a running subcuticular suture. The epidermis was then sealed with Dermabond. The dermatotomy at the venous access site was also closed with Dermabond. The port catheter was left accessed and sterile bandages were applied. Next, the  facial mass was evaluated. The mass was interrogated with ultrasound. A suitable biopsy entry site was identified in the submental space allowing passage through relatively normal soft tissues in a region of relatively low vascularity within the mass. The skin was sterilely prepped and draped in the usual fashion using chlorhexidine  skin prep. Local anesthesia was attained by infiltration with 1% lidocaine . A small dermatotomy was made. Under real-time ultrasound guidance, multiple 18 gauge core biopsies were obtained using the Argon automated biopsy device. Biopsy specimens were placed on a moistened Telfa pad and submitted to pathology for further analysis. Post biopsy imaging demonstrates no evidence of active bleeding or other complication. IMPRESSION: Successful placement of a right IJ approach Bard Clear Vue port catheter with ultrasound and fluoroscopic guidance. The catheter is ready for use. Successful ultrasound-guided biopsy of right facial mass. Electronically Signed   By: Wilkie Lent M.D.   On: 10/15/2023 11:35   CT SOFT TISSUE NECK W CONTRAST Result Date: 10/14/2023 CLINICAL DATA:  89 year old male with a rapidly enlarging right face mass and history of follicular lymphoma. EXAM: CT NECK WITH CONTRAST TECHNIQUE: Multidetector CT imaging of the neck was performed using the standard protocol following the bolus administration of intravenous contrast. RADIATION DOSE REDUCTION: This exam was performed according to the departmental dose-optimization program which includes automated exposure control, adjustment of the mA and/or kV according to patient size and/or use of iterative reconstruction technique. CONTRAST:  75mL OMNIPAQUE  IOHEXOL  300 MG/ML  SOLN COMPARISON:  Face/neck soft tissue ultrasound 08/27/2023. PET-CT 08/25/2023. Neck CT 04/13/2020. FINDINGS: Pharynx and larynx: Larynx and pharynx soft tissue contours, mucosal enhancement remain within normal limits. Negative parapharyngeal and  retropharyngeal spaces. Salivary glands: Large, amorphous, relatively homogeneous trans spatial soft tissue mass about the right mandible body and ramus corresponding to similar appearing mass with intense FDG uptake on June PET-CT. The bulk of the mass is superficial to the mandible oil as in June. This mass with indistinct margins encompasses 95 x 44 by 124 mm (AP by transverse by CC) subcutaneous and dermis involvement. Some involvement of the right lower masticator space. Extension into the right submandibular space with mild mass effect on the right submandibular gland (series 2, image 62). Relatively spared left sublingual space. Left anterior inferior parotid space mildly affected. Contralateral left submandibular, masticator and parotid spaces are negative. Thyroid : Negative. Lymph nodes: Large superficial right face mass described above. Associated contiguous abnormal right level 1A lymph node  in the midline which is 18 mm short axis on series 2, image 73. This is new or progressed since 08/25/2023. But little if any additional cervical lymphadenopathy, right level 2 nodes remain subcentimeter. Vascular: Both internal jugular veins in the neck appear partially effaced but remain patent. Other major vascular structures in the bilateral neck and at the skull base are patent. Right greater than left carotid bifurcation atherosclerosis. Limited intracranial: Negative for age. Visualized orbits: Stable since 2022 and negative. Mastoids and visualized paranasal sinuses: Visualized paranasal sinuses and mastoids are stable and well aerated. Skeleton: Despite the bulky adjacent soft tissue mass the right mandible appears to remain intact and normally located. And no acute or suspicious osseous lesion is identified, with chronic cervical spine degeneration. Upper chest: Bilateral lung apex and subpleural mild lung scarring. No visible superior mediastinal or axillary lymphadenopathy. IMPRESSION: 1. Very large and  trans-spatial up to 12 cm long axis Lymphomatous Mass about the right mandible, corresponding to the large FDG avid tumor in June. Adjacent abnormal level 1A lymph node in the midline is new or progressed since June. 2. But no other lymphadenopathy in the Neck or at the thoracic inlet. And the underlying right mandible remains intact. Electronically Signed   By: VEAR Hurst M.D.   On: 10/14/2023 13:16   ADDENDUM .Patient was Personally and independently interviewed, examined and relevant elements of the history of present illness were reviewed in details and an assessment and plan was created. All elements of the patient's history of present illness , assessment and plan were discussed in details with Shoichi Mielke NP. The above documentation reflects our combined findings assessment and plan.   Gautam Kale MD MS

## 2023-10-21 NOTE — Progress Notes (Signed)
 PHARMACY - ANTICOAGULATION CONSULT NOTE  Pharmacy Consult for Heparin  Indication: chest pain/ACS  No Known Allergies  Patient Measurements: Height: 5' 6 (167.6 cm) Weight: 64.7 kg (142 lb 10.2 oz) IBW/kg (Calculated) : 63.8 HEPARIN  DW (KG): 64  Vital Signs: Temp: 98.1 F (36.7 C) (08/05 0800) Temp Source: Oral (08/05 0800) BP: 117/41 (08/05 0400) Pulse Rate: 66 (08/05 0400)  Labs: Recent Labs     0000 10/18/23 1543 10/18/23 1826 10/18/23 1929 10/18/23 2335 10/19/23 0242 10/19/23 1247 10/19/23 1606 10/20/23 0014 10/20/23 0552 10/20/23 1109 10/20/23 1901 10/21/23 0752  HGB   < >  --   --   --   --  9.3*  --   --   --  9.8*  --   --  10.2*  HCT  --   --   --   --   --  30.3*  --   --   --  31.1*  --   --  33.6*  PLT  --   --   --   --   --  199  --   --   --  177  --   --  180  APTT  --   --  31  --   --   --   --   --   --   --   --   --   --   LABPROT  --   --  16.1*  --   --   --   --   --   --   --   --   --   --   INR  --   --  1.2  --   --   --   --   --   --   --   --   --   --   HEPARINUNFRC  --   --   --   --   --  <0.10*   < >  --    < >  --  0.26* 0.25* 0.38  CREATININE   < >  --   --   --   --  0.85  --  0.94  --  0.81  --   --  0.84  TROPONINIHS  --  735*  --  575* 539*  --   --   --   --   --   --   --   --    < > = values in this interval not displayed.    Estimated Creatinine Clearance: 57 mL/min (by C-G formula based on SCr of 0.84 mg/dL).  Assessment: Pharmacy consulted to manage IV heparin  for ACS/STEMI.  Evaluated today by cardiology for chest pain and increasingly elevated troponins.   10/19/23 Heparin  level  = .38 - therapeutic after heparin  rate increased to 1550 units/hr Pt with bleeding/oozing at [facility] 8/4> >> RN applied surgicel pad with improvement 8/5 no more bleeding at [facility] site 8/4 CT soft tissue of neck negative for hemorrhage Dopplers + age indeterminate LLE DVT CBC stable  Goal of Therapy:  Heparin  level 0.3-0.7  units/ml Monitor platelets by anticoagulation protocol: Yes   Plan:  - continue heparin  drip @ 1550 units/hr  - recheck confirmatory heparin  level in 8 hr - Monitor daily heparin  level when titrated to goal, CBC, signs/symptoms of bleeding - f/u transition to DOAC once no invasive procedures planned (may need PAC removal)   Rosaline IVAR Edison, Pharm.D Use secure chat  for questions 10/21/2023 9:40 AM

## 2023-10-21 NOTE — Plan of Care (Signed)

## 2023-10-21 NOTE — Progress Notes (Signed)
  Progress Note  Patient Name: Harold [name] Date of Encounter: 10/21/2023 [facility] Cardiologist: Lonni Cash, MD   Interval Summary   Reports feeling good Just wants more answers on when he is going to start treatment Denies any shortness of breath  Vital Signs Vitals:   10/21/23 0400 10/21/23 0500 10/21/23 0800 10/21/23 0937  BP: (!) 117/41   (!) 117/41  Pulse: 66   70  Resp: 15     Temp: 98.3 F (36.8 C)  98.1 F (36.7 C)   TempSrc: Oral  Oral   SpO2: 98%     Weight:  64.7 kg    Height:        Intake/Output Summary (Last 24 hours) at 10/21/2023 1039 Last data filed at 10/21/2023 1019 Gross per 24 hour  Intake 1854.63 ml  Output 3300 ml  Net -1445.37 ml      10/21/2023    5:00 AM 10/20/2023    6:00 AM 10/20/2023    5:00 AM  Last 3 Weights  Weight (lbs) 142 lb 10.2 oz 141 lb 1.5 oz 152 lb 5.4 oz  Weight (kg) 64.7 kg 64 kg 69.1 kg     Telemetry/ECG  Sinus rhythm, HR 70s - Personally Reviewed  Physical Exam  GEN: No acute distress.   Neck: large asymmetric right facial swelling  Cardiac: RRR, no murmurs, rubs, or gallops.  Respiratory: Clear to auscultation bilaterally. GI: Soft, nontender, non-distended  MS: 2+ LE edema, R > L  Assessment & Plan   History of CAD  NSTEMI  Presented with chest pain  Troponin 18 > 48 > 421 > 610 > 735 > 575 > 539 Suspect demand ischemic in the setting of sepsis, hypotension, tachycardia  No recurrent chest pain, shortness of breath LHC from 01/2023 showed: severe mid LAD stenosis s/p lithotripsy, mild LCX stenosis, mild-moderate RCA stenosis  Continue ASA 81 mg daily Continue atenolol  12.5 mg daily Continue pravastatin  40 mg daily   Chronic HFpEF Echo this admission: EF 60-65%, G2DD, normal RV function, mild MS, moderate TR, mild to moderate AS, normal IVC Given IV fluids for septic shock  BNP 625 > 165 Started on IV lasix  yesterday, out 2.7 L yesterday  Net + 4.3 L  Weight today 142 lb Creatinine  stable  Most recent BP 117/41, HR 70 Continue strict I&O's, daily weights, daily BMPs Received IV Lasix  40 mg this AM, good urinary response  Continue atenolol  12.5 mg daily  LLE DVT 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

## 2023-10-21 NOTE — Progress Notes (Signed)
 PHARMACY - ANTICOAGULATION CONSULT NOTE  Pharmacy Consult for Heparin  Indication: chest pain/ACS  No Known Allergies  Patient Measurements: Height: 5' 6 (167.6 cm) Weight: 64.7 kg (142 lb 10.2 oz) IBW/kg (Calculated) : 63.8 HEPARIN  DW (KG): 64  Vital Signs: Temp: 98.2 F (36.8 C) (08/05 1600) Temp Source: Oral (08/05 1600) BP: 117/41 (08/05 0937) Pulse Rate: 71 (08/05 1300)  Labs: Recent Labs     0000 10/18/23 2335 10/19/23 0242 10/19/23 1247 10/19/23 1606 10/20/23 0014 10/20/23 0552 10/20/23 1109 10/20/23 1901 10/21/23 0752 10/21/23 1939  HGB   < >  --  9.3*  --   --   --  9.8*  --   --  10.2*  --   HCT  --   --  30.3*  --   --   --  31.1*  --   --  33.6*  --   PLT  --   --  199  --   --   --  177  --   --  180  --   HEPARINUNFRC  --   --  <0.10*   < >  --    < >  --    < > 0.25* 0.38 0.41  CREATININE   < >  --  0.85  --  0.94  --  0.81  --   --  0.84  --   TROPONINIHS  --  539*  --   --   --   --   --   --   --   --   --    < > = values in this interval not displayed.    Estimated Creatinine Clearance: 57 mL/min (by C-G formula based on SCr of 0.84 mg/dL).  Assessment: Pharmacy consulted to manage IV heparin  for ACS/STEMI.  Evaluated today by cardiology for chest pain and increasingly elevated troponins.   10/19/23 Heparin  level 0.41 - remains therapeutic on heparin  1550 units/hr Pt with bleeding/oozing at [facility] 8/4> >> RN applied surgicel pad with improvement 8/5 no more bleeding at [facility] site or other s/sx of bleeding, per RN 8/4 CT soft tissue of neck negative for hemorrhage Dopplers + age indeterminate LLE DVT CBC stable  Goal of Therapy:  Heparin  level 0.3-0.7 units/ml Monitor platelets by anticoagulation protocol: Yes   Plan:  - Continue heparin  drip @ 1550 units/hr  - Monitor daily heparin  level, CBC, signs/symptoms of bleeding - f/u transition to DOAC once no invasive procedures planned (may need PAC removal)   Lacinda Moats, PharmD Clinical  Pharmacist  8/5/20258:06 PM

## 2023-10-22 ENCOUNTER — Telehealth: Payer: Self-pay

## 2023-10-22 ENCOUNTER — Encounter: Payer: Self-pay | Admitting: Hematology

## 2023-10-22 DIAGNOSIS — C8338 Diffuse large B-cell lymphoma, lymph nodes of multiple sites: Secondary | ICD-10-CM | POA: Diagnosis not present

## 2023-10-22 DIAGNOSIS — R7881 Bacteremia: Secondary | ICD-10-CM | POA: Diagnosis not present

## 2023-10-22 DIAGNOSIS — R6521 Severe sepsis with septic shock: Secondary | ICD-10-CM | POA: Diagnosis not present

## 2023-10-22 DIAGNOSIS — I5033 Acute on chronic diastolic (congestive) heart failure: Secondary | ICD-10-CM | POA: Diagnosis not present

## 2023-10-22 DIAGNOSIS — A419 Sepsis, unspecified organism: Secondary | ICD-10-CM | POA: Diagnosis not present

## 2023-10-22 DIAGNOSIS — I890 Lymphedema, not elsewhere classified: Secondary | ICD-10-CM | POA: Diagnosis not present

## 2023-10-22 DIAGNOSIS — I82402 Acute embolism and thrombosis of unspecified deep veins of left lower extremity: Secondary | ICD-10-CM | POA: Diagnosis not present

## 2023-10-22 DIAGNOSIS — R509 Fever, unspecified: Secondary | ICD-10-CM | POA: Diagnosis not present

## 2023-10-22 DIAGNOSIS — R220 Localized swelling, mass and lump, head: Secondary | ICD-10-CM | POA: Diagnosis not present

## 2023-10-22 LAB — CBC
HCT: 32.2 % — ABNORMAL LOW (ref 39.0–52.0)
Hemoglobin: 9.9 g/dL — ABNORMAL LOW (ref 13.0–17.0)
MCH: 27.2 pg (ref 26.0–34.0)
MCHC: 30.7 g/dL (ref 30.0–36.0)
MCV: 88.5 fL (ref 80.0–100.0)
Platelets: 192 K/uL (ref 150–400)
RBC: 3.64 MIL/uL — ABNORMAL LOW (ref 4.22–5.81)
RDW: 14.9 % (ref 11.5–15.5)
WBC: 6.4 K/uL (ref 4.0–10.5)
nRBC: 0 % (ref 0.0–0.2)

## 2023-10-22 LAB — BASIC METABOLIC PANEL WITH GFR
Anion gap: 8 (ref 5–15)
BUN: 18 mg/dL (ref 8–23)
CO2: 28 mmol/L (ref 22–32)
Calcium: 7.9 mg/dL — ABNORMAL LOW (ref 8.9–10.3)
Chloride: 97 mmol/L — ABNORMAL LOW (ref 98–111)
Creatinine, Ser: 0.74 mg/dL (ref 0.61–1.24)
GFR, Estimated: >60 mL/min (ref >60)
Glucose, Bld: 90 mg/dL (ref 70–99)
Potassium: 3.6 mmol/L (ref 3.5–5.1)
Sodium: 133 mmol/L — ABNORMAL LOW (ref 135–145)

## 2023-10-22 LAB — PHOSPHORUS: Phosphorus: 2.3 mg/dL — ABNORMAL LOW (ref 2.5–4.6)

## 2023-10-22 LAB — HEPARIN LEVEL (UNFRACTIONATED): Heparin Unfractionated: 0.3 [IU]/mL (ref 0.30–0.70)

## 2023-10-22 LAB — MAGNESIUM: Magnesium: 2 mg/dL (ref 1.7–2.4)

## 2023-10-22 MED ORDER — SODIUM PHOSPHATES 45 MMOLE/15ML IV SOLN
30.0000 mmol | Freq: Once | INTRAVENOUS | Status: AC
  Administered 2023-10-22: 30 mmol via INTRAVENOUS
  Filled 2023-10-22: qty 10

## 2023-10-22 NOTE — Progress Notes (Signed)
 Date of Admission:  10/18/2023     ID: Harold [name] is a 89 y.o. 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)   Bacteremia due to group B Streptococcus    Subjective: Stable Feeling okay  Medications:   acyclovir   400 mg Oral Daily   allopurinol   100 mg Oral BID   aspirin  EC  81 mg Oral Daily   atenolol   12.5 mg Oral Daily   brimonidine   1 drop Right Eye BID   Chlorhexidine  Gluconate Cloth  6 each Topical Daily   cholecalciferol   1,000 Units Oral Daily   cyanocobalamin   2,500 mcg Oral Daily   finasteride   5 mg Oral Daily   furosemide   40 mg Intravenous Daily   pantoprazole   40 mg Oral Daily   pravastatin   40 mg Oral QHS   sodium chloride  flush  10-40 mL Intracatheter Q12H    Objective: Vital signs in last 24 hours: Patient Vitals for the past 24 hrs:  BP Temp Temp src Pulse Resp SpO2  10/22/23 0800 -- 97.8 F (36.6 C) Oral -- -- --  10/22/23 0500 (!) 124/43 -- -- 67 16 98 %  10/22/23 0429 -- 97.9 F (36.6 C) Oral -- -- --  10/22/23 0400 (!) 119/42 -- -- 66 16 99 %  10/22/23 0300 (!) 126/53 -- -- 82 19 97 %  10/22/23 0200 (!) 116/42 -- -- 63 13 97 %  10/22/23 0100 (!) 106/40 -- -- 65 15 95 %  10/22/23 0010 -- 98 F (36.7 C) Oral -- -- --  10/22/23 0000 (!) 104/45 -- -- 68 12 96 %  10/21/23 2333 (!) 112/47 -- -- 73 (!) 21 97 %  10/21/23 2014 -- 98.3 F (36.8 C) Oral -- -- --  10/21/23 1600 -- 98.2 F (36.8 C) Oral -- -- --  10/21/23 1300 -- -- -- 71 16 97 %  10/21/23 1200 -- 97.9 F (36.6 C) Oral 76 19 99 %       PHYSICAL EXAM:  General: Alert, cooperative, no distress, appears stated age.  Rt facial mass- less erythematous Lungs: Clear to auscultation bilaterally. No Wheezing or Rhonchi. No rales. Heart: Regular  rate and rhythm, no murmur, rub or gallop. PORT site has superificial bruising- being used and he has no issues as of now Abdomen: Soft, non-tender,not distended. Bowel sounds normal. No masses Extremities: edema legs Erythema rt leg minimal Skin: No rashes or lesions. Or bruising Lymph: Cervical, supraclavicular normal. Neurologic: Grossly non-focal  Lab Results    Latest Ref Rng & Units 10/22/2023    5:04 AM 10/21/2023    7:52 AM 10/20/2023    5:52 AM  CBC  WBC 4.0 - 10.5 K/uL 6.4  5.5  6.7   Hemoglobin 13.0 - 17.0 g/dL 9.9  89.7  9.8   Hematocrit 39.0 - 52.0 % 32.2  33.6  31.1   Platelets 150 - 400 K/uL 192  180  177        Latest Ref Rng & Units 10/22/2023    5:04 AM 10/21/2023    7:52 AM 10/20/2023    5:52 AM  CMP  Glucose 70 - 99 mg/dL 90  86  88   BUN 8 -  23 mg/dL 18  17  19    Creatinine 0.61 - 1.24 mg/dL 9.25  9.15  9.18   Sodium 135 - 145 mmol/L 133  137  139   Potassium 3.5 - 5.1 mmol/L 3.6  3.8  3.6   Chloride 98 - 111 mmol/L 97  101  104   CO2 22 - 32 mmol/L 28  25  25    Calcium 8.9 - 10.3 mg/dL 7.9  8.2  7.9       Microbiology: 10/18/23 BC 1 set of 2 group b streptococcus 10/19/23 BC- NG Studies/Results: CT SOFT TISSUE NECK W CONTRAST Result Date: 10/20/2023 CLINICAL DATA:  Right neck mass EXAM: CT NECK WITH CONTRAST TECHNIQUE: Multidetector CT imaging of the neck was performed using the standard protocol following the bolus administration of intravenous contrast. RADIATION DOSE REDUCTION: This exam was performed according to the departmental dose-optimization program which includes automated exposure control, adjustment of the mA and/or kV according to patient size and/or use of iterative reconstruction technique. CONTRAST:  75mL OMNIPAQUE  IOHEXOL  300 MG/ML  SOLN COMPARISON:  10/14/2023 FINDINGS: Again noted is a large mass superficial to the mandible in the right-side of the neck. There is no hemorrhage. Pharynx: The nasopharynx, oropharynx and hypopharynx are normal Oral  cavity/floor of mouth: Normal Larynx: Normal Salivary glands: The parotid and submandibular glands are normal Thyroid : Normal Lymph nodes: No adenopathy Vascular: No significant abnormality Limited intracranial: No significant abnormality Visualized orbits: No significant abnormality Mastoids and visualized paranasal sinuses: No significant abnormality Skeleton: No significant abnormality Upper chest: No significant abnormality Other: None IMPRESSION: Approximally 11.6 by 4 x 11 cm mass in the right side of the face/neck unchanged from the prior study. No hemorrhage. Electronically Signed   By: Nancyann Burns M.D.   On: 10/20/2023 15:08     Assessment/Plan:  Fever sudden onset in a patient with a rapidly growing rt facial mass which was recently biopsied Group B streptococcus bacteremia Source could be rt leg cellulitis rt facial mass, biopsy site Or PORT penicillin  IV    HE has lymphedema with h/o cellulitis rt leg Though the erthema could be due to just swelling   Rt facial mass is enlarged but tightness and redness improved NO hemorrhage into the tumor on CT (especially he was on IV heparin  and recent biopsy)       Port is being used now- If he experiences any fever or chills then it will have to be removed    Low risk for endocarditis ? Anemia   High trponin- demand ischemia in a patient with CAD and stents  DVT-left PT and Pveins-age indeterminate  Discussed the management with the patient in detail  Discussed with his nurse and hospitalist

## 2023-10-22 NOTE — Progress Notes (Signed)
 Progress Note   Patient: Harold [name] FMW:[id number] DOB: 1934/05/30 DOA: 10/18/2023     4 DOS: the patient was seen and examined on 10/22/2023   Brief hospital course: 89 y.o. 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   Patient recently admitted 7/28 - 7/31 for rapidly enlarging right neck mass concerning for high-grade lymphoma.  Seen by medical oncology and underwent IR biopsy and Port-A-Cath placement on 7/30 and subsequently discharged home on prednisone  with planned initiation of chemotherapy on 8/4.   In the ED, temperature 98.9 F, HR 110, RR 20, BP 83/48.  WBC 9.5, hemoglobin 11.7, platelet count 235.  Sodium 141, potassium 3.3, chloride 101, CO2 26, glucose 99, BUN 35, creatinine 0.77.  High sensitive troponin 18>48>421.  BNP 625.0.  Lactic acid 2.6.  Urinalysis unrevealing.  Influenza/RSV/COVID PCR negative.  Blood cultures x 2 and urine culture obtained.  Chest x-ray with no acute cardiopulmonary disease process.  Ultrasound bilateral lower extremities with no evidence of DVT RLE, findings of age-indeterminate DVT left posterior tibial veins and left peroneal veins.  Patient was aggressively resuscitated with IV fluids, started on vancomycin  and cefepime  and placed on a norepinephrine  drip, heparin  drip.  Cardiology was consulted.  PCCM was consulted and patient was admitted to the intensive care unit.   Significant hospital events: 8/2: admit by PCCM to ICU, Vanc/Cefepime , U/S LE with age indeterminate DVT LLE, heparin  gtt, Cards consulted, on Levophed  drip 8/3: Blood Cx 1 out 4 (aerobic) + Streptococcus per BCID, antibiotics changed to vancomycin  and cefazolin ; titrated off Levophed , TTE completed 8/4: Transfer to SDU under  TRH, antibiotics further de-escalated to penicillin , ID consulted, CT soft tissue neck w/o concern for hemorrhage within right neck mass  Assessment and Plan: Septic shock secondary to group B Streptococcus bacteremia Right lower extremity cellulitis Patient presenting to the ED with fever, weakness.  Recently diagnosed with presumed high-grade lymphoma in which she underwent recent Port-A-Cath placement and biopsy of right neck mass on 7/30.  Potential etiology for bacterial translocation.  On arrival to the ED patient was afebrile with temperature 98.9, and WBC count 9.5.  Elevated lactic acid 2.6.  Patient was aggressively resuscitated, but remained hypotensive and subsequently placed on vasopressor support.  Patient was admitted to the intensive care unit and started on IV vancomycin  and cefepime .  Blood cultures 1 out of 4 growing Streptococcus antibiotics were de-escalated to vancomycin  and cefazolin  and now to penicillin  alone.  TTE with LVEF 60 to 65%, moderate thickening of the aortic valve but no other valvular abnormalities identified.  Vasopressors were weaned off. -- Infectious disease following, appreciate assistance -- Blood Cx x 2 8/2: 1/4 (aerobic) + group B Streptococcus -- Repeat blood cultures 8/3: no growth x 2 days -- Urine culture: No growth -- Continue IV penicillin  -- Currently ID okay with continued port in place, will hold off on use currently   NSTEMI Patient presenting with chest pain, notably elevated troponin.  History of CAD with LHC 01/2023 60% ostial to prox RCA, 30% mid RCA, 40% prox LCx, 80% mid LAD proximal to prior LAD stent, 30% ISR of mid LAD stent , 50% dLAD and then occluded dLAD status post intracoronary lithotripsy of heavily calcified LAD with suboptimal expansion with balloon and  therefore stent was not placed.  TTE with LVEF 60-65%, no regional wall motion abnormalities, grade 2 diastolic dysfunction. -- Cardiology following, appreciate assistance -- hs  Troponin 18>48>421>610>735>575>539 -- Continues on heparin  drip -- Currently chest pain-free, monitor on telemetry   Low-grade follicular lymphoma Rapidly enlarging right neck mass Concern for high-grade lymphoma Patient recently admitted for rapidly enlarging right neck mass with concern for high-grade lymphoma.  CT neck with contrast 7/29: Very large and transfacial up to 12 cm long axis lymphomatous mass about the right mandible, corresponding to the large FDG avid tumor in June.  Adjacent abnormal level 1A lymph node in the midline is new or progressed since June. S/p IR core biopsies and Port-A-Cath placement on 7/30.  CT soft tissue neck 8/4 negative for hemorrhage within the right neck mass. -- Follows with Medical Oncology, Dr. [name] -- Pathology from lymph node biopsy 7/30: pos for diffuse large B-cell lymphoma -- Oncology plans to initiate chemotherapy during this hospitalization. Discussed with Oncology. Clear by ID and Cardiology   Age indeterminant LLE DVT Vascular duplex ultrasound on admission notable for age-indeterminate DVT left posterior tibial veins and left peroneal veins.  Previously known history of DVT no longer on Xarelto. -- Plan to resume DOAC   Hypokalemia Normalized   Essential hypertension Chronic diastolic congestive heart failure, compensated -- Atenolol  12.5 mg p.o. daily -- Furosemide  40 mg IV every 24 hours -- Continue monitor BP closely -- Strict I's and O's and daily weights   Hyperlipidemia -- Continue pravastatin  40 mg PO daily   CAD s/p remote stents Denies chest pain/angina.  No longer on Brilinta . -- Continue statin, atenolol    BPH -- Continue finasteride  5 mg p.o. daily   Anemia of chronic disease Hemoglobin stable      Subjective: Without complaints this AM  Physical Exam: Vitals:   10/22/23 1100 10/22/23 1200 10/22/23 1300 10/22/23 1400  BP: (!) 105/50 103/61 (!) 114/52 (!) 109/49  Pulse: 75 76 72 76  Resp: 17 18 15  (!) 22   Temp:  98.3 F (36.8 C)    TempSrc:  Oral    SpO2: 99% 96% 100% 98%  Weight:      Height:       General exam: Awake, laying in bed, in nad, large R sided facial mass Respiratory system: Normal respiratory effort, no wheezing Cardiovascular system: regular rate, s1, s2 Gastrointestinal system: Soft, nondistended, positive BS Central nervous system: CN2-12 grossly intact, strength intact Extremities: Perfused, no clubbing Skin: Normal skin turgor, no notable skin lesions seen Psychiatry: Mood normal // no visual hallucinations   Data Reviewed:  Labs reviewed: Na 133, K 3.6, Cr 0.74, WBC 6.4, Hgb 9.9, Plts 192  Family Communication: Pt in room, family at bedside  Disposition: Status is: Inpatient Remains inpatient appropriate because: severity of illness  Planned Discharge Destination: Home    Author: Garnette Pelt, MD 10/22/2023 5:44 PM  For on call review [email or web address].

## 2023-10-22 NOTE — Telephone Encounter (Signed)
 Oral Oncology Patient Advocate Encounter  I called and spoke with daughter Adrien, it is cheaper for him to get his Vepesid  through [employer]. They did call her to get delivery set up for tomorrow 8/7, should arrive between 8-12.  Lucie Lamer, CPhT [place]  [facility] Pharmacy Technician Patient Advocate Specialist II [place] Phone: [phone]  Fax: [phone] [email or web address]

## 2023-10-22 NOTE — Hospital Course (Signed)
 89 y.o. 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   Patient recently admitted 7/28 - 7/31 for rapidly enlarging right neck mass concerning for high-grade lymphoma.  Seen by medical oncology and underwent IR biopsy and Port-A-Cath placement on 7/30 and subsequently discharged home on prednisone  with planned initiation of chemotherapy on 8/4.   In the ED, temperature 98.9 F, HR 110, RR 20, BP 83/48.  WBC 9.5, hemoglobin 11.7, platelet count 235.  Sodium 141, potassium 3.3, chloride 101, CO2 26, glucose 99, BUN 35, creatinine 0.77.  High sensitive troponin 18>48>421.  BNP 625.0.  Lactic acid 2.6.  Urinalysis unrevealing.  Influenza/RSV/COVID PCR negative.  Blood cultures x 2 and urine culture obtained.  Chest x-ray with no acute cardiopulmonary disease process.  Ultrasound bilateral lower extremities with no evidence of DVT RLE, findings of age-indeterminate DVT left posterior tibial veins and left peroneal veins.  Patient was aggressively resuscitated with IV fluids, started on vancomycin  and cefepime  and placed on a norepinephrine  drip, heparin  drip.  Cardiology was consulted.  PCCM was consulted and patient was admitted to the intensive care unit.   Significant hospital events: 8/2: admit by PCCM to ICU, Vanc/Cefepime , U/S LE with age indeterminate DVT LLE, heparin  gtt, Cards consulted, on Levophed  drip 8/3: Blood Cx 1 out 4 (aerobic) + Streptococcus per BCID, antibiotics changed to vancomycin  and cefazolin ; titrated off Levophed , TTE completed 8/4: Transfer to SDU under TRH, antibiotics further de-escalated to penicillin , ID consulted, CT soft tissue neck w/o concern for hemorrhage within right neck mass

## 2023-10-22 NOTE — Plan of Care (Signed)
   Problem: Education: Goal: Knowledge of General Education information will improve Description Including pain rating scale, medication(s)/side effects and non-pharmacologic comfort measures Outcome: Progressing   Problem: Health Behavior/Discharge Planning: Goal: Ability to manage health-related needs will improve Outcome: Progressing

## 2023-10-22 NOTE — Progress Notes (Signed)
  Progress Note  Patient Name: Harold [name] Date of Encounter: 10/22/2023 [facility] Cardiologist: Lonni Cash, MD   Interval Summary   Really good spirits this morning Got washed up and is ready for treatment  Tells me today is the best his jaw has felt since he's been here   Vital Signs Vitals:   10/22/23 0400 10/22/23 0429 10/22/23 0500 10/22/23 0800  BP: (!) 119/42  (!) 124/43   Pulse: 66  67   Resp: 16  16   Temp:  97.9 F (36.6 C)  97.8 F (36.6 C)  TempSrc:  Oral  Oral  SpO2: 99%  98%   Weight:      Height:        Intake/Output Summary (Last 24 hours) at 10/22/2023 0944 Last data filed at 10/22/2023 9060 Gross per 24 hour  Intake 1019.17 ml  Output 3145 ml  Net -2125.83 ml      10/21/2023    5:00 AM 10/20/2023    6:00 AM 10/20/2023    5:00 AM  Last 3 Weights  Weight (lbs) 142 lb 10.2 oz 141 lb 1.5 oz 152 lb 5.4 oz  Weight (kg) 64.7 kg 64 kg 69.1 kg     Telemetry/ECG  Sinus rhythm, HR 80s - Personally Reviewed  Physical Exam  GEN: No acute distress.   Neck: large asymmetric right facial swelling  Cardiac: RRR, no murmurs, rubs, or gallops.  Respiratory: Clear to auscultation bilaterally. GI: Soft, nontender, non-distended  MS: mild edema, improving   Assessment & Plan  History of CAD  NSTEMI  Presented with chest pain  Troponin 18 > 48 > 421 > 610 > 735 > 575 > 539 Suspect demand ischemic in the setting of sepsis, hypotension, tachycardia  No recurrent chest pain, shortness of breath LHC from 01/2023 showed: severe mid LAD stenosis s/p lithotripsy, mild LCX stenosis, mild-moderate RCA stenosis  There are no plans for invasive cardiac evaluation  Continue ASA 81 mg daily Continue atenolol  12.5 mg daily Continue pravastatin  40 mg daily    Chronic HFpEF Echo this admission: EF 60-65%, G2DD, normal RV function, mild MS, moderate TR, mild to moderate AS, normal IVC Given IV fluids for septic shock  BNP 625 > 165 Urine output of 2.7 L  then 3.4 L the last two days, still net positive overall Creatinine stable  Most recent BP 124/43, HR 67 Continue strict I&O's, daily weights, daily BMPs Currently on IV Lasix  40 mg daily -- okay to continue at this dose  Continue atenolol  12.5 mg daily   LLE DVT Found to have DVT in LLE He tells me he has had this for years, has been on and off Eliquis  in the past  Currently on IV heparin  Plan to transition to DOAC    Per primary Septic shock Bacteremia Follicular lymphoma Anemia Electrolyte disturbances DVT  Hyperlipidemia  BPH   For questions or updates, please contact [facility] Please consult [email or web address] for contact info under       Signed, Waddell DELENA Donath, PA-C

## 2023-10-22 NOTE — Progress Notes (Signed)
 PHARMACY - ANTICOAGULATION CONSULT NOTE  Pharmacy Consult for Heparin  Indication: DVT  No Known Allergies  Patient Measurements: Height: 5' 6 (167.6 cm) Weight: 64.7 kg (142 lb 10.2 oz) IBW/kg (Calculated) : 63.8 HEPARIN  DW (KG): 64  Vital Signs: Temp: 97.9 F (36.6 C) (08/06 0429) Temp Source: Oral (08/06 0429) BP: 124/43 (08/06 0500) Pulse Rate: 67 (08/06 0500)  Labs: Recent Labs    10/20/23 0552 10/20/23 1109 10/21/23 0752 10/21/23 1939 10/22/23 0504  HGB 9.8*  --  10.2*  --  9.9*  HCT 31.1*  --  33.6*  --  32.2*  PLT 177  --  180  --  192  HEPARINUNFRC  --    < > 0.38 0.41 0.30  CREATININE 0.81  --  0.84  --  0.74   < > = values in this interval not displayed.    Estimated Creatinine Clearance: 59.8 mL/min (by C-G formula based on SCr of 0.74 mg/dL).  Assessment: Pharmacy consulted to manage IV heparin  for ACS/STEMI.  Evaluated by cardiology for chest pain and increasingly elevated troponins - no further chest pain and no plans for cardiac catheterization. Dopplers + age indeterminate LLE DVT.  He was previously on apixaban  for DVT, but last filled 03/09/2023 and no longer taking outpatient.    Today, 10/22/2023:  Heparin  level  0.3, therapeutic at low end of goal range on heparin  1550 units/hr CBC: Hgb low/stable at 9.9, Plt WNL Pt with bleeding/oozing at [facility] 8/4> >> RN applied surgicel pad with improvement.  No further bleeding at [facility] site reported, but RN notes minor bleeding at peripheral IV sites.   Goal of Therapy:  Heparin  level 0.3-0.7 units/ml Monitor platelets by anticoagulation protocol: Yes   Plan:  - Increase heparin  drip to 1600 units/hr   - Monitor daily heparin  level when titrated to goal, CBC, signs/symptoms of bleeding - f/u transition to DOAC once no invasive procedures planned (may need PAC removal)   Wanda Hasting PharmD, BCPS [facility] main pharmacy [phone] 10/22/2023 8:03 AM

## 2023-10-22 NOTE — Progress Notes (Cosign Needed)
 Dontrell Stuck Umscheid   DOB:September 20, 1934   FM#:[id number]      ASSESSMENT & PLAN:  Rhonda Vangieson. Harnden  is an 89 year old male patient with oncologic history significant for follicular lymphoma.  Patient was discharged from inpatient on 10/16/23 and was scheduled to start chemotherapy 10/20/22.  Readmitted on 10/18/23 with complaints of chest pain, elevated troponin, bacteremia.      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 Likely progression to high-grade. --LN biopsy of right axilla done 6/11, showed low grade FL. - Facial mass biopsy done 7/30.  Morphologic features consistent with DLBCL.  -- CT neck done 7/29 shows very large up to 12 cm lymphomatous mass about the right mandible, no other lymphadenopathy in the neck or thoracic inlet.  --Port placed 7/30 for chemotherapy -->per ID okay to use however close monitoring for fever and chills. -Chemotherapy R-CEOP was scheduled as outpatient 8/4.  Plan to give CEOP regimen beginning tomorrow 8/7 with etoposide  day 2 and 3.  Rituximab  planned for outpatient. - Discussed with cardio, okay to proceed with treatment. - Medical oncology/Dr. [name] following closely.    Septic shock Bacteremia RLE cellulitis -Patient admitted on 10/18/2023 with fever and weakness. - Blood cultures positive for strep on 8/2.  Repeat 8/3 with NGTD. Neg for MRSA. Urine cx neg. Resp panel neg.   -- Continue IV antibiotics as ordered.   NSTEMI History of CAD - Patient with elevated troponin, decreasing - Denies further chest pain, states he has not had any since admission.   - Cardiology following closely    Anemia, normocytic - Mild - Hemoglobin stable 9.9 -No transfusional intervention required at this time - Continue to monitor CBC with differential   History of melanoma - On chin - Status post resection   History of head  and neck basal cell carcinoma - Continue close follow-up with Derm   History of DVT - LLE, age indeterminate -Patient reports being on Eliquis  on and off for several years - IV heparin  infusing well, agree with plan to transition to DOAC      Code Status DNR-Limited  Subjective:  Patient seen awake and alert sitting up in chair at bedside eating lunch.  No new complaints, denies further chest pain or discomfort.  Patient's daughter and son-in-law at bedside.  Patient and family pleased to hear that chemotherapy is being planned for tomorrow.  Objective:   Intake/Output Summary (Last 24 hours) at 10/22/2023 1248 Last data filed at 10/22/2023 1213 Gross per 24 hour  Intake 592.56 ml  Output 2695 ml  Net -2102.44 ml     PHYSICAL EXAMINATION: ECOG PERFORMANCE STATUS: 3 - Symptomatic, >50% confined to bed  Vitals:   10/22/23 0500 10/22/23 0800  BP: (!) 124/43   Pulse: 67   Resp: 16   Temp:  97.8 F (36.6 C)  SpO2: 98%    Filed Weights   10/20/23 0500 10/20/23 0600 10/21/23 0500  Weight: 152 lb 5.4 oz (69.1 kg) 141 lb 1.5 oz (64 kg) 142 lb 10.2 oz (64.7 kg)    GENERAL: alert, +right sided facial mass SKIN: +RLE erythema and 1+edema, texture, turgor are normal, no rashes or significant lesions EYES: normal, conjunctiva are pink and non-injected, sclera clear OROPHARYNX: no exudate, no erythema and lips, buccal mucosa, and tongue normal  NECK: supple, thyroid  normal size, non-tender, without nodularity LYMPH: no palpable lymphadenopathy in the cervical, axillary or inguinal LUNGS: clear  to auscultation and percussion with normal breathing effort HEART: regular rate & rhythm and no murmurs and no lower extremity edema ABDOMEN: abdomen soft, non-tender and normal bowel sounds MUSCULOSKELETAL: no cyanosis of digits and no clubbing  PSYCH: alert & oriented x 3 with fluent speech NEURO: no focal motor/sensory deficits   All questions were answered. The patient knows to call  the clinic with any problems, questions or concerns.   The total time spent in the appointment was 50 minutes encounter with patient including review of chart and various tests results, discussions about plan of care and coordination of care plan  [name], NP 10/22/2023 12:48 PM    Labs Reviewed:  Lab Results  Component Value Date   WBC 6.4 10/22/2023   HGB 9.9 (L) 10/22/2023   HCT 32.2 (L) 10/22/2023   MCV 88.5 10/22/2023   PLT 192 10/22/2023   Recent Labs    09/15/23 0931 09/29/23 1120 10/13/23 1212 10/16/23 0700 10/20/23 0552 10/21/23 0752 10/22/23 0504  NA 145 140 143   < > 139 137 133*  K 3.3* 4.0 3.6   < > 3.6 3.8 3.6  CL 104 103 103   < > 104 101 97*  CO2 32 32 35*   < > 25 25 28   GLUCOSE 96 88 85   < > 88 86 90  BUN 29* 18 22   < > 19 17 18   CREATININE 1.13 0.96 1.00   < > 0.81 0.84 0.74  CALCIUM 9.7 8.9 8.9   < > 7.9* 8.2* 7.9*  GFRNONAA >60 >60 >60   < > >60 >60 >60  PROT 6.9 5.9* 6.1*  --   --   --   --   ALBUMIN 4.3 3.6 3.6  --   --   --   --   AST 14* 22 15  --   --   --   --   ALT 12 16 10   --   --   --   --   ALKPHOS 61 51 53  --   --   --   --   BILITOT 0.8 0.7 0.5  --   --   --   --    < > = values in this interval not displayed.    Studies Reviewed:  CT SOFT TISSUE NECK W CONTRAST Result Date: 10/20/2023 CLINICAL DATA:  Right neck mass EXAM: CT NECK WITH CONTRAST TECHNIQUE: Multidetector CT imaging of the neck was performed using the standard protocol following the bolus administration of intravenous contrast. RADIATION DOSE REDUCTION: This exam was performed according to the departmental dose-optimization program which includes automated exposure control, adjustment of the mA and/or kV according to patient size and/or use of iterative reconstruction technique. CONTRAST:  75mL OMNIPAQUE  IOHEXOL  300 MG/ML  SOLN COMPARISON:  10/14/2023 FINDINGS: Again noted is a large mass superficial to the mandible in the right-side of the neck. There is no  hemorrhage. Pharynx: The nasopharynx, oropharynx and hypopharynx are normal Oral cavity/floor of mouth: Normal Larynx: Normal Salivary glands: The parotid and submandibular glands are normal Thyroid : Normal Lymph nodes: No adenopathy Vascular: No significant abnormality Limited intracranial: No significant abnormality Visualized orbits: No significant abnormality Mastoids and visualized paranasal sinuses: No significant abnormality Skeleton: No significant abnormality Upper chest: No significant abnormality Other: None IMPRESSION: Approximally 11.6 by 4 x 11 cm mass in the right side of the face/neck unchanged from the prior study. No hemorrhage. Electronically Signed   By: Nancyann  Heck M.D.   On: 10/20/2023 15:08   VAS US  LOWER EXTREMITY VENOUS (DVT) Result Date: 10/19/2023  Lower Venous DVT Study Patient Name:  BRODEN HOLT  Date of Exam:   10/18/2023 Medical Rec #: [id number]       Accession #:    [id number] Date of Birth: 1934-06-05       Patient Gender: M Patient Age:   33 years Exam Location:  [facility] Procedure:      VAS US  LOWER EXTREMITY VENOUS (DVT) Referring Phys: LAMAR CHRIS --------------------------------------------------------------------------------  Indications: Right > L Edema.  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 Chronic HFpEF. Limitations: Pitting edema, involuntary patient movement. Comparison Study: Prior bilateral LEV done 05/05/20 indicating chronic left                   peroneal DVT Performing Technologist: Alberta Lis RVS  Examination Guidelines: A complete evaluation includes B-mode imaging, spectral Doppler, color Doppler, and power Doppler as needed of all accessible portions of each vessel. Bilateral testing is considered an integral part of a complete examination. Limited examinations for reoccurring indications may be performed as noted. The reflux portion of the exam is performed  with the patient in reverse Trendelenburg.  +---------+---------------+---------+-----------+----------+--------------+ RIGHT    CompressibilityPhasicitySpontaneityPropertiesThrombus Aging +---------+---------------+---------+-----------+----------+--------------+ CFV      Full           Yes      No                                  +---------+---------------+---------+-----------+----------+--------------+ SFJ      Full                                                        +---------+---------------+---------+-----------+----------+--------------+ FV Prox  Full           Yes      No                                  +---------+---------------+---------+-----------+----------+--------------+ FV Mid   Full                                                        +---------+---------------+---------+-----------+----------+--------------+ FV DistalFull                                                        +---------+---------------+---------+-----------+----------+--------------+ PFV      Full           Yes      No                                  +---------+---------------+---------+-----------+----------+--------------+ POP      Full           Yes  No                                  +---------+---------------+---------+-----------+----------+--------------+ PTV      Full                                                        +---------+---------------+---------+-----------+----------+--------------+ PERO     Full                                                        +---------+---------------+---------+-----------+----------+--------------+   +---------+---------------+---------+-----------+----------+-------------------+ LEFT     CompressibilityPhasicitySpontaneityPropertiesThrombus Aging      +---------+---------------+---------+-----------+----------+-------------------+ CFV      Full           Yes      No                                        +---------+---------------+---------+-----------+----------+-------------------+ SFJ      Full                                                             +---------+---------------+---------+-----------+----------+-------------------+ FV Prox  Full           Yes      No                                       +---------+---------------+---------+-----------+----------+-------------------+ FV Mid   Full                                                             +---------+---------------+---------+-----------+----------+-------------------+ FV DistalFull           Yes      No                                       +---------+---------------+---------+-----------+----------+-------------------+ PFV      Full                                                             +---------+---------------+---------+-----------+----------+-------------------+ POP                     Yes      No  patent by color and                                                       Doppler             +---------+---------------+---------+-----------+----------+-------------------+ PTV      None                                         Age Indeterminate   +---------+---------------+---------+-----------+----------+-------------------+ PERO     None                                         Age Indeterminate   +---------+---------------+---------+-----------+----------+-------------------+     Summary: RIGHT: - There is no evidence of deep vein thrombosis in the lower extremity.  Interstitial edema noted throughout the extremity  LEFT: - Findings consistent with age indeterminate deep vein thrombosis involving the left posterior tibial veins, and left peroneal veins.  Interstitial edema noted throughout the extremity.  *See table(s) above for measurements and observations. Electronically signed by Debby Robertson on 10/19/2023 at 3:36:23 PM.    Final     ECHOCARDIOGRAM COMPLETE Result Date: 10/19/2023    ECHOCARDIOGRAM REPORT   Patient Name:   HERBERTO LEDWELL Date of Exam: 10/19/2023 Medical Rec #:  [id number]      Height:       66.0 in Accession #:    [id number]     Weight:       153.9 lb Date of Birth:  1934/03/24      BSA:          1.789 m Patient Age:    88 years       BP:           111/48 mmHg Patient Gender: M              HR:           69 bpm. Exam Location:  Inpatient Procedure: 2D Echo, Cardiac Doppler, Color Doppler, Strain Analysis and 3D Echo            (Both Spectral and Color Flow Doppler were utilized during            procedure). Indications:    Shock  History:        Patient has prior history of Echocardiogram examinations, most                 recent 10/15/2023. CAD and Previous Myocardial Infarction, Mitral                 Valve Disease and Aortic Valve Disease; Risk                 Factors:Hypertension and Dyslipidemia.  Sonographer:    Therisa Crouch Referring Phys: [id number] ROBERT S BYRUM IMPRESSIONS  1. No new RWMA;s since TTE done 10/15/23. Left ventricular ejection fraction, by estimation, is 60 to 65%. The left ventricle has normal function. The left ventricle has no regional wall motion abnormalities. Left ventricular diastolic parameters are consistent with Grade II diastolic dysfunction (pseudonormalization). Elevated left ventricular end-diastolic pressure. The average left ventricular global longitudinal  strain is -19.9 %. The global longitudinal strain is normal.  2. Right ventricular systolic function is normal. The right ventricular size is normal.  3. Right atrial size was mildly dilated.  4. The mitral valve is degenerative. Trivial mitral valve regurgitation. Mild mitral stenosis. Moderate mitral annular calcification.  5. Tricuspid valve regurgitation is moderate.  6. The aortic valve is tricuspid. There is moderate calcification of the aortic valve. There is moderate thickening of the aortic valve. Aortic valve regurgitation is not  visualized. Mild to moderate aortic valve stenosis.  7. The inferior vena cava is normal in size with greater than 50% respiratory variability, suggesting right atrial pressure of 3 mmHg. FINDINGS  Left Ventricle: No new RWMA;s since TTE done 10/15/23. Left ventricular ejection fraction, by estimation, is 60 to 65%. The left ventricle has normal function. The left ventricle has no regional wall motion abnormalities. The average left ventricular global longitudinal strain is -19.9 %. Strain was performed and the global longitudinal strain is normal. The left ventricular internal cavity size was normal in size. There is no left ventricular hypertrophy. Left ventricular diastolic parameters are consistent with Grade II diastolic dysfunction (pseudonormalization). Elevated left ventricular end-diastolic pressure. Right Ventricle: The right ventricular size is normal. No increase in right ventricular wall thickness. Right ventricular systolic function is normal. Left Atrium: Left atrial size was normal in size. Right Atrium: Right atrial size was mildly dilated. Pericardium: There is no evidence of pericardial effusion. Mitral Valve: The mitral valve is degenerative in appearance. Moderate mitral annular calcification. Trivial mitral valve regurgitation. Mild mitral valve stenosis. MV peak gradient, 8.9 mmHg. The mean mitral valve gradient is 5.0 mmHg. Tricuspid Valve: The tricuspid valve is normal in structure. Tricuspid valve regurgitation is moderate . No evidence of tricuspid stenosis. Aortic Valve: The aortic valve is tricuspid. There is moderate calcification of the aortic valve. There is moderate thickening of the aortic valve. Aortic valve regurgitation is not visualized. Mild to moderate aortic stenosis is present. Aortic valve mean gradient measures 11.7 mmHg. Aortic valve peak gradient measures 20.4 mmHg. Aortic valve area, by VTI measures 1.32 cm. Pulmonic Valve: The pulmonic valve was normal in structure.  Pulmonic valve regurgitation is trivial. No evidence of pulmonic stenosis. Aorta: The aortic root is normal in size and structure. Venous: The inferior vena cava is normal in size with greater than 50% respiratory variability, suggesting right atrial pressure of 3 mmHg. IAS/Shunts: No atrial level shunt detected by color flow Doppler. Additional Comments: 3D was performed not requiring image post processing on an independent workstation and was indeterminate.  LEFT VENTRICLE PLAX 2D LVIDd:         3.80 cm   Diastology LVIDs:         2.50 cm   LV e' medial:    6.64 cm/s LV PW:         0.90 cm   LV E/e' medial:  17.3 LV IVS:        0.90 cm   LV e' lateral:   7.83 cm/s LVOT diam:     2.00 cm   LV E/e' lateral: 14.7 LV SV:         66 LV SV Index:   37        2D Longitudinal Strain LVOT Area:     3.14 cm  2D Strain GLS Avg:     -19.9 %  3D Volume EF                          LV 3D EDV:   103.42 ml                          LV 3D ESV:   39.24 ml RIGHT VENTRICLE             IVC RV Basal diam:  3.60 cm     IVC diam: 3.00 cm RV S prime:     13.30 cm/s TAPSE (M-mode): 2.4 cm LEFT ATRIUM             Index        RIGHT ATRIUM           Index LA diam:        3.60 cm 2.01 cm/m   RA Area:     20.60 cm LA Vol (A2C):   77.6 ml 43.38 ml/m  RA Volume:   55.40 ml  30.97 ml/m LA Vol (A4C):   75.6 ml 42.26 ml/m LA Biplane Vol: 77.3 ml 43.21 ml/m  AORTIC VALVE AV Area (Vmax):    1.27 cm AV Area (Vmean):   1.23 cm AV Area (VTI):     1.32 cm AV Vmax:           226.00 cm/s AV Vmean:          158.333 cm/s AV VTI:            0.505 m AV Peak Grad:      20.4 mmHg AV Mean Grad:      11.7 mmHg LVOT Vmax:         91.05 cm/s LVOT Vmean:        62.100 cm/s LVOT VTI:          0.212 m LVOT/AV VTI ratio: 0.42  AORTA Ao Root diam: 3.50 cm Ao Asc diam:  3.20 cm MITRAL VALVE                TRICUSPID VALVE MV Area (PHT): 3.17 cm     TR Peak grad:   34.8 mmHg MV Area VTI:   1.32 cm     TR Vmax:        295.00 cm/s MV Peak  grad:  8.9 mmHg MV Mean grad:  5.0 mmHg     SHUNTS MV Vmax:       1.49 m/s     Systemic VTI:  0.21 m MV Vmean:      106.5 cm/s   Systemic Diam: 2.00 cm MV Decel Time: 239 msec MV E velocity: 115.00 cm/s MV A velocity: 130.00 cm/s MV E/A ratio:  0.88 Maude Emmer MD Electronically signed by Maude Emmer MD Signature Date/Time: 10/19/2023/10:56:55 AM    Final    DG Chest Port 1 View Result Date: 10/18/2023 EXAM: 1 VIEW XRAY OF THE CHEST 10/18/2023 02:13:00 AM COMPARISON: 01/01/2011 CLINICAL HISTORY: 355200 Chest pain [id number]. Per chart: Patient BIB EMS for chest pain and fever that started last night. Patient was seen here earlier this week and had port implanted for chemotherapy related to Non-Hodgkin's lymphoma. Patient given 324 ASA and 1 inch nitropaste from EMS. Denies any pain at this time. FINDINGS: LUNGS AND PLEURA: No focal pulmonary opacity. No pulmonary edema. No pleural effusion. No pneumothorax. HEART AND MEDIASTINUM: No acute abnormality of the cardiac and mediastinal silhouettes. BONES AND SOFT TISSUES: No acute  osseous abnormality. Right chest wall port-a-cath with tip in the lower SVC. IMPRESSION: 1. No acute process. Electronically signed by: Franky Stanford MD 10/18/2023 02:24 AM EDT RP Workstation: [id number]   ECHOCARDIOGRAM COMPLETE Result Date: 10/15/2023    ECHOCARDIOGRAM REPORT   Patient Name:   DOMINGO FUSON Date of Exam: 10/15/2023 Medical Rec #:  [id number]      Height:       66.0 in Accession #:    [id number]     Weight:       155.3 lb Date of Birth:  01/09/1935      BSA:          1.796 m Patient Age:    88 years       BP:           95/50 mmHg Patient Gender: M              HR:           68 bpm. Exam Location:  Inpatient Procedure: 2D Echo, 3D Echo, Strain Analysis, Cardiac Doppler and Color Doppler            (Both Spectral and Color Flow Doppler were utilized during            procedure). Indications:    CAD native vessel I25.10  History:        Patient has prior history of Echocardiogram  examinations, most                 recent 01/29/2023. CAD and Previous Myocardial Infarction,                 Mitral Valve Disease; Risk Factors:Hypertension and                 Dyslipidemia.  Sonographer:    Koleen Popper RDCS Referring Phys: [id number] ANAND D HONGALGI  Sonographer Comments: Global longitudinal strain was attempted. IMPRESSIONS  1. Left ventricular ejection fraction, by estimation, is 60 to 65%. Left ventricular ejection fraction by 3D volume is 62 %. The left ventricle has normal function. The left ventricle has no regional wall motion abnormalities. Left ventricular diastolic  parameters are consistent with Grade II diastolic dysfunction (pseudonormalization). The average left ventricular global longitudinal strain is -19.9 %. The global longitudinal strain is normal.  2. Right ventricular systolic function is mildly reduced. The right ventricular size is mildly enlarged. There is mildly elevated pulmonary artery systolic pressure. The estimated right ventricular systolic pressure is 41.6 mmHg.  3. The mitral valve is normal in structure. Mild mitral valve regurgitation. Mild mitral stenosis. Severe mitral annular calcification.  4. The tricuspid valve is degenerative. Tricuspid valve regurgitation is moderate.  5. The aortic valve has an indeterminant number of cusps. There is severe calcifcation of the aortic valve. Aortic valve regurgitation is mild. Mild aortic valve stenosis. Aortic valve Vmax measures 2.17 m/s.  6. The inferior vena cava is dilated in size with <50% respiratory variability, suggesting right atrial pressure of 15 mmHg. FINDINGS  Left Ventricle: Left ventricular ejection fraction, by estimation, is 60 to 65%. Left ventricular ejection fraction by 3D volume is 62 %. The left ventricle has normal function. The left ventricle has no regional wall motion abnormalities. The average left ventricular global longitudinal strain is -19.9 %. Strain was performed and the global longitudinal  strain is normal. The left ventricular internal cavity size was normal in size. There is no left ventricular hypertrophy. Left ventricular diastolic parameters are consistent with Grade II  diastolic dysfunction (pseudonormalization). Right Ventricle: The right ventricular size is mildly enlarged. No increase in right ventricular wall thickness. Right ventricular systolic function is mildly reduced. There is mildly elevated pulmonary artery systolic pressure. The tricuspid regurgitant  velocity is 2.58 m/s, and with an assumed right atrial pressure of 15 mmHg, the estimated right ventricular systolic pressure is 41.6 mmHg. Left Atrium: Left atrial size was normal in size. Right Atrium: Right atrial size was normal in size. Pericardium: There is no evidence of pericardial effusion. Mitral Valve: The mitral valve is normal in structure. Severe mitral annular calcification. Mild mitral valve regurgitation. Mild mitral valve stenosis. MV peak gradient, 12.7 mmHg. The mean mitral valve gradient is 4.0 mmHg. Tricuspid Valve: The tricuspid valve is degenerative in appearance. Tricuspid valve regurgitation is moderate . No evidence of tricuspid stenosis. Aortic Valve: The aortic valve has an indeterminant number of cusps. There is severe calcifcation of the aortic valve. Aortic valve regurgitation is mild. Mild aortic stenosis is present. Aortic valve mean gradient measures 11.0 mmHg. Aortic valve peak gradient measures 18.8 mmHg. Aortic valve area, by VTI measures 1.07 cm. Pulmonic Valve: The pulmonic valve was grossly normal. Pulmonic valve regurgitation is mild. No evidence of pulmonic stenosis. Aorta: The aortic root is normal in size and structure. Venous: The inferior vena cava is dilated in size with less than 50% respiratory variability, suggesting right atrial pressure of 15 mmHg. IAS/Shunts: No atrial level shunt detected by color flow Doppler. Additional Comments: 3D was performed not requiring image post  processing on an independent workstation and was normal.  LEFT VENTRICLE PLAX 2D LVIDd:         3.60 cm         Diastology LVIDs:         2.30 cm         LV e' medial:    6.20 cm/s LV PW:         1.00 cm         LV E/e' medial:  18.9 LV IVS:        1.00 cm         LV e' lateral:   5.77 cm/s LVOT diam:     1.50 cm         LV E/e' lateral: 20.3 LV SV:         46 LV SV Index:   26              2D Longitudinal LVOT Area:     1.77 cm        Strain                                2D Strain GLS   -19.9 %                                Avg:                                 3D Volume EF                                LV 3D EF:    Left  ventricul                                             ar                                             ejection                                             fraction                                             by 3D                                             volume is                                             62 %.                                 3D Volume EF:                                3D EF:        62 %                                LV EDV:       93 ml                                LV ESV:       35 ml                                LV SV:        58 ml RIGHT VENTRICLE             IVC RV Basal diam:  4.30 cm     IVC diam: 2.40 cm RV Mid diam:    3.80 cm RV S prime:     14.90 cm/s TAPSE (M-mode): 2.0 cm LEFT ATRIUM             Index        RIGHT ATRIUM           Index LA diam:        3.30 cm 1.84 cm/m   RA Area:     12.80 cm LA Vol (A2C):   37.9 ml 21.10 ml/m  RA Volume:   29.90 ml  16.65 ml/m LA Vol (A4C):   35.3 ml 19.65 ml/m LA Biplane Vol: 39.5 ml 21.99 ml/m  AORTIC VALVE AV Area (Vmax):    1.07 cm AV Area (Vmean):   0.94 cm AV Area (VTI):     1.07 cm AV Vmax:           217.00 cm/s AV Vmean:          157.000 cm/s AV VTI:            0.432 m AV Peak Grad:      18.8 mmHg AV Mean Grad:      11.0 mmHg LVOT Vmax:         131.00 cm/s LVOT  Vmean:        83.200 cm/s LVOT VTI:          0.261 m LVOT/AV VTI ratio: 0.60  AORTA Ao Root diam: 2.70 cm Ao Asc diam:  3.30 cm MITRAL VALVE                TRICUSPID VALVE MV Area (PHT): 2.16 cm     TR Peak grad:   26.6 mmHg MV Area VTI:   1.02 cm     TR Vmax:        258.00 cm/s MV Peak grad:  12.7 mmHg MV Mean grad:  4.0 mmHg     SHUNTS MV Vmax:       1.78 m/s     Systemic VTI:  0.26 m MV Vmean:      95.9 cm/s    Systemic Diam: 1.50 cm MV Decel Time: 351 msec MV E velocity: 117.00 cm/s MV A velocity: 149.00 cm/s MV E/A ratio:  0.79 Morene Brownie Electronically signed by Morene Brownie Signature Date/Time: 10/15/2023/6:24:54 PM    Final    IR IMAGING GUIDED PORT INSERTION Result Date: 10/15/2023 INDICATION: 89 year old male with a known history of low-grade follicular lymphoma with enlarging face all mass concerning for transformation to high-grade lymphoma. He presents for port catheter placement to establish durable venous access in preparation for chemotherapy as well as ultrasound-guided core biopsy of the facial mass. EXAM: IMPLANTED PORT A CATH PLACEMENT WITH ULTRASOUND AND FLUOROSCOPIC GUIDANCE ULTRASOUND-GUIDED CORE BIOPSY MEDICATIONS: None. ANESTHESIA/SEDATION: Versed  2 mg IV; Fentanyl  100 mcg IV; administered by the radiology nurse Moderate Sedation Time:  42 minutes The patient's vital signs and level of consciousness were continuously monitored during the procedure by the interventional radiology nurse under my direct supervision. FLUOROSCOPY: Radiation exposure index: 3 mGy reference air kerma COMPLICATIONS: None immediate. PROCEDURE: The right neck and chest was prepped with chlorhexidine , and draped in the usual sterile fashion using maximum barrier technique (cap and mask, sterile gown, sterile gloves, large sterile sheet, hand hygiene and cutaneous antiseptic). Local anesthesia was attained by infiltration with 1% lidocaine  with epinephrine . Ultrasound demonstrated patency of the right  internal jugular vein, and this was documented with an image. Under real-time ultrasound guidance, this vein was accessed with a 21 gauge micropuncture needle and image documentation was performed. A small dermatotomy was made at the access site with an 11 scalpel. A 0.018 wire was advanced into the SVC and the access needle exchanged for a 45F micropuncture vascular sheath. The 0.018 wire was then removed and a 0.035 wire advanced into the IVC. An appropriate location for the subcutaneous reservoir was selected below the clavicle and an incision was made through the skin and underlying soft tissues. The subcutaneous tissues were then dissected using a combination of blunt and sharp  surgical technique and a pocket was formed. A Bard Clear Vue single lumen power injectable portacatheter was then tunneled through the subcutaneous tissues from the pocket to the dermatotomy and the port reservoir placed within the subcutaneous pocket. The venous access site was then serially dilated and a peel away vascular sheath placed over the wire. The wire was removed and the port catheter advanced into position under fluoroscopic guidance. The catheter tip is positioned in the superior cavoatrial junction. This was documented with a spot image. The portacatheter was then tested and found to flush and aspirate well. The port was flushed with saline followed by 100 units/mL heparinized saline. The pocket was then closed in two layers using first subdermal inverted interrupted absorbable sutures followed by a running subcuticular suture. The epidermis was then sealed with Dermabond. The dermatotomy at the venous access site was also closed with Dermabond. The port catheter was left accessed and sterile bandages were applied. Next, the facial mass was evaluated. The mass was interrogated with ultrasound. A suitable biopsy entry site was identified in the submental space allowing passage through relatively normal soft tissues in a  region of relatively low vascularity within the mass. The skin was sterilely prepped and draped in the usual fashion using chlorhexidine  skin prep. Local anesthesia was attained by infiltration with 1% lidocaine . A small dermatotomy was made. Under real-time ultrasound guidance, multiple 18 gauge core biopsies were obtained using the Argon automated biopsy device. Biopsy specimens were placed on a moistened Telfa pad and submitted to pathology for further analysis. Post biopsy imaging demonstrates no evidence of active bleeding or other complication. IMPRESSION: Successful placement of a right IJ approach Bard Clear Vue port catheter with ultrasound and fluoroscopic guidance. The catheter is ready for use. Successful ultrasound-guided biopsy of right facial mass. Electronically Signed   By: Wilkie Lent M.D.   On: 10/15/2023 11:35   IR US  Guide Bx Asp/Drain Result Date: 10/15/2023 INDICATION: 89 year old male with a known history of low-grade follicular lymphoma with enlarging face all mass concerning for transformation to high-grade lymphoma. He presents for port catheter placement to establish durable venous access in preparation for chemotherapy as well as ultrasound-guided core biopsy of the facial mass. EXAM: IMPLANTED PORT A CATH PLACEMENT WITH ULTRASOUND AND FLUOROSCOPIC GUIDANCE ULTRASOUND-GUIDED CORE BIOPSY MEDICATIONS: None. ANESTHESIA/SEDATION: Versed  2 mg IV; Fentanyl  100 mcg IV; administered by the radiology nurse Moderate Sedation Time:  42 minutes The patient's vital signs and level of consciousness were continuously monitored during the procedure by the interventional radiology nurse under my direct supervision. FLUOROSCOPY: Radiation exposure index: 3 mGy reference air kerma COMPLICATIONS: None immediate. PROCEDURE: The right neck and chest was prepped with chlorhexidine , and draped in the usual sterile fashion using maximum barrier technique (cap and mask, sterile gown, sterile gloves,  large sterile sheet, hand hygiene and cutaneous antiseptic). Local anesthesia was attained by infiltration with 1% lidocaine  with epinephrine . Ultrasound demonstrated patency of the right internal jugular vein, and this was documented with an image. Under real-time ultrasound guidance, this vein was accessed with a 21 gauge micropuncture needle and image documentation was performed. A small dermatotomy was made at the access site with an 11 scalpel. A 0.018 wire was advanced into the SVC and the access needle exchanged for a 49F micropuncture vascular sheath. The 0.018 wire was then removed and a 0.035 wire advanced into the IVC. An appropriate location for the subcutaneous reservoir was selected below the clavicle and an incision was made through the skin and  underlying soft tissues. The subcutaneous tissues were then dissected using a combination of blunt and sharp surgical technique and a pocket was formed. A Bard Clear Vue single lumen power injectable portacatheter was then tunneled through the subcutaneous tissues from the pocket to the dermatotomy and the port reservoir placed within the subcutaneous pocket. The venous access site was then serially dilated and a peel away vascular sheath placed over the wire. The wire was removed and the port catheter advanced into position under fluoroscopic guidance. The catheter tip is positioned in the superior cavoatrial junction. This was documented with a spot image. The portacatheter was then tested and found to flush and aspirate well. The port was flushed with saline followed by 100 units/mL heparinized saline. The pocket was then closed in two layers using first subdermal inverted interrupted absorbable sutures followed by a running subcuticular suture. The epidermis was then sealed with Dermabond. The dermatotomy at the venous access site was also closed with Dermabond. The port catheter was left accessed and sterile bandages were applied. Next, the facial mass  was evaluated. The mass was interrogated with ultrasound. A suitable biopsy entry site was identified in the submental space allowing passage through relatively normal soft tissues in a region of relatively low vascularity within the mass. The skin was sterilely prepped and draped in the usual fashion using chlorhexidine  skin prep. Local anesthesia was attained by infiltration with 1% lidocaine . A small dermatotomy was made. Under real-time ultrasound guidance, multiple 18 gauge core biopsies were obtained using the Argon automated biopsy device. Biopsy specimens were placed on a moistened Telfa pad and submitted to pathology for further analysis. Post biopsy imaging demonstrates no evidence of active bleeding or other complication. IMPRESSION: Successful placement of a right IJ approach Bard Clear Vue port catheter with ultrasound and fluoroscopic guidance. The catheter is ready for use. Successful ultrasound-guided biopsy of right facial mass. Electronically Signed   By: Wilkie Lent M.D.   On: 10/15/2023 11:35   CT SOFT TISSUE NECK W CONTRAST Result Date: 10/14/2023 CLINICAL DATA:  88 year old male with a rapidly enlarging right face mass and history of follicular lymphoma. EXAM: CT NECK WITH CONTRAST TECHNIQUE: Multidetector CT imaging of the neck was performed using the standard protocol following the bolus administration of intravenous contrast. RADIATION DOSE REDUCTION: This exam was performed according to the departmental dose-optimization program which includes automated exposure control, adjustment of the mA and/or kV according to patient size and/or use of iterative reconstruction technique. CONTRAST:  75mL OMNIPAQUE  IOHEXOL  300 MG/ML  SOLN COMPARISON:  Face/neck soft tissue ultrasound 08/27/2023. PET-CT 08/25/2023. Neck CT 04/13/2020. FINDINGS: Pharynx and larynx: Larynx and pharynx soft tissue contours, mucosal enhancement remain within normal limits. Negative parapharyngeal and retropharyngeal  spaces. Salivary glands: Large, amorphous, relatively homogeneous trans spatial soft tissue mass about the right mandible body and ramus corresponding to similar appearing mass with intense FDG uptake on June PET-CT. The bulk of the mass is superficial to the mandible oil as in June. This mass with indistinct margins encompasses 95 x 44 by 124 mm (AP by transverse by CC) subcutaneous and dermis involvement. Some involvement of the right lower masticator space. Extension into the right submandibular space with mild mass effect on the right submandibular gland (series 2, image 62). Relatively spared left sublingual space. Left anterior inferior parotid space mildly affected. Contralateral left submandibular, masticator and parotid spaces are negative. Thyroid : Negative. Lymph nodes: Large superficial right face mass described above. Associated contiguous abnormal right level 1A lymph node  in the midline which is 18 mm short axis on series 2, image 73. This is new or progressed since 08/25/2023. But little if any additional cervical lymphadenopathy, right level 2 nodes remain subcentimeter. Vascular: Both internal jugular veins in the neck appear partially effaced but remain patent. Other major vascular structures in the bilateral neck and at the skull base are patent. Right greater than left carotid bifurcation atherosclerosis. Limited intracranial: Negative for age. Visualized orbits: Stable since 2022 and negative. Mastoids and visualized paranasal sinuses: Visualized paranasal sinuses and mastoids are stable and well aerated. Skeleton: Despite the bulky adjacent soft tissue mass the right mandible appears to remain intact and normally located. And no acute or suspicious osseous lesion is identified, with chronic cervical spine degeneration. Upper chest: Bilateral lung apex and subpleural mild lung scarring. No visible superior mediastinal or axillary lymphadenopathy. IMPRESSION: 1. Very large and trans-spatial up  to 12 cm long axis Lymphomatous Mass about the right mandible, corresponding to the large FDG avid tumor in June. Adjacent abnormal level 1A lymph node in the midline is new or progressed since June. 2. But no other lymphadenopathy in the Neck or at the thoracic inlet. And the underlying right mandible remains intact. Electronically Signed   By: VEAR Hurst M.D.   On: 10/14/2023 13:16   ADDENDUM  .Patient was Personally and independently interviewed, examined and relevant elements of the history of present illness were reviewed in details and an assessment and plan was created. All elements of the patient's history of present illness , assessment and plan were discussed in details with Olam Brunner NP. The above documentation reflects our combined findings assessment and plan. Patient afebrile and is feeling better from the sepsis standpoint.  No active chest pain at this time.  Right jaw/upper neck mass is growing rapidly. Core needle biopsy results from biopsy done on 10/15/2023 resulted today and confirmed diffuse large B-cell lymphoma. Results were discussed in detail with the patient. ID and cardiology are comfortable with the patient starting for cycle of chemotherapy as inpatient. Patient is keen to get started on treatment as soon as possible.  He is still able to maintain his p.o. intake and has no acute swallowing issues or breathing issues but the mass is getting quite uncomfortable. Will start cycle 1 of CEOP as inpatient from 10/23/2023.  Day 2 and day 3 etoposide  would prefer to do p.o..  Daughter will pick up his outpatient prescription and provided to the inpatient pharmacist for this. Will do Rituxan  as outpatient. Appreciate excellent nursing care's hospital medicine cares, input from ID and cardiology pharmacy support.  Gautam Kale MD MS

## 2023-10-22 NOTE — Progress Notes (Signed)
 Pharmacy Electrolyte Replacement [facility] Adult ICU replacement protocol    Recent Labs    10/22/23 0504  K 3.6  MG 2.0  PHOS 2.3*  CREATININE 0.74    Low Critical Values (K </= 2.5, Phos </= 1, Mg </= 1) Present: None  Plan: NaPhos 30 mmol IV x1 dose  Mackynzie Woolford PharmD, BCPS [facility] main pharmacy [phone] 10/22/2023 9:46 AM

## 2023-10-23 ENCOUNTER — Inpatient Hospital Stay (HOSPITAL_COMMUNITY)

## 2023-10-23 DIAGNOSIS — C8338 Diffuse large B-cell lymphoma, lymph nodes of multiple sites: Secondary | ICD-10-CM | POA: Diagnosis not present

## 2023-10-23 DIAGNOSIS — R6521 Severe sepsis with septic shock: Secondary | ICD-10-CM | POA: Diagnosis not present

## 2023-10-23 DIAGNOSIS — I82402 Acute embolism and thrombosis of unspecified deep veins of left lower extremity: Secondary | ICD-10-CM | POA: Diagnosis not present

## 2023-10-23 DIAGNOSIS — C8308 Small cell B-cell lymphoma, lymph nodes of multiple sites: Secondary | ICD-10-CM

## 2023-10-23 DIAGNOSIS — I5033 Acute on chronic diastolic (congestive) heart failure: Secondary | ICD-10-CM | POA: Diagnosis not present

## 2023-10-23 DIAGNOSIS — C8208 Follicular lymphoma grade I, lymph nodes of multiple sites: Secondary | ICD-10-CM | POA: Diagnosis not present

## 2023-10-23 DIAGNOSIS — A419 Sepsis, unspecified organism: Secondary | ICD-10-CM | POA: Diagnosis not present

## 2023-10-23 DIAGNOSIS — Z5111 Encounter for antineoplastic chemotherapy: Secondary | ICD-10-CM | POA: Diagnosis not present

## 2023-10-23 HISTORY — PX: IR REMOVAL TUN ACCESS W/ PORT W/O FL MOD SED: IMG2290

## 2023-10-23 LAB — PHOSPHORUS: Phosphorus: 2.8 mg/dL (ref 2.5–4.6)

## 2023-10-23 LAB — COMPREHENSIVE METABOLIC PANEL WITH GFR
ALT: 11 U/L (ref 0–44)
AST: 17 U/L (ref 15–41)
Albumin: 2.6 g/dL — ABNORMAL LOW (ref 3.5–5.0)
Alkaline Phosphatase: 45 U/L (ref 38–126)
Anion gap: 10 (ref 5–15)
BUN: 18 mg/dL (ref 8–23)
CO2: 28 mmol/L (ref 22–32)
Calcium: 8.3 mg/dL — ABNORMAL LOW (ref 8.9–10.3)
Chloride: 98 mmol/L (ref 98–111)
Creatinine, Ser: 0.75 mg/dL (ref 0.61–1.24)
GFR, Estimated: >60 mL/min (ref >60)
Glucose, Bld: 103 mg/dL — ABNORMAL HIGH (ref 70–99)
Potassium: 3.6 mmol/L (ref 3.5–5.1)
Sodium: 136 mmol/L (ref 135–145)
Total Bilirubin: 0.6 mg/dL (ref 0.0–1.2)
Total Protein: 5.2 g/dL — ABNORMAL LOW (ref 6.5–8.1)

## 2023-10-23 LAB — HEPARIN LEVEL (UNFRACTIONATED): Heparin Unfractionated: 0.44 [IU]/mL (ref 0.30–0.70)

## 2023-10-23 LAB — CULTURE, BLOOD (ROUTINE X 2): Culture: NO GROWTH

## 2023-10-23 LAB — CBC
HCT: 32.3 % — ABNORMAL LOW (ref 39.0–52.0)
Hemoglobin: 10.3 g/dL — ABNORMAL LOW (ref 13.0–17.0)
MCH: 28 pg (ref 26.0–34.0)
MCHC: 31.9 g/dL (ref 30.0–36.0)
MCV: 87.8 fL (ref 80.0–100.0)
Platelets: 205 K/uL (ref 150–400)
RBC: 3.68 MIL/uL — ABNORMAL LOW (ref 4.22–5.81)
RDW: 14.9 % (ref 11.5–15.5)
WBC: 10.2 K/uL (ref 4.0–10.5)
nRBC: 0 % (ref 0.0–0.2)

## 2023-10-23 MED ORDER — LIDOCAINE-EPINEPHRINE 1 %-1:100000 IJ SOLN
20.0000 mL | Freq: Once | INTRAMUSCULAR | Status: AC
  Administered 2023-10-23: 10 mL via INTRADERMAL

## 2023-10-23 MED ORDER — ATENOLOL 25 MG PO TABS
12.5000 mg | ORAL_TABLET | Freq: Every day | ORAL | Status: DC
  Administered 2023-10-24 – 2023-10-25 (×2): 12.5 mg via ORAL
  Filled 2023-10-23 (×2): qty 1

## 2023-10-23 MED ORDER — DEXAMETHASONE SODIUM PHOSPHATE 10 MG/ML IJ SOLN
10.0000 mg | Freq: Once | INTRAMUSCULAR | Status: AC
Start: 2023-10-23 — End: 2023-10-23
  Administered 2023-10-23: 10 mg via INTRAVENOUS
  Filled 2023-10-23: qty 1

## 2023-10-23 MED ORDER — HEPARIN (PORCINE) 25000 UT/250ML-% IV SOLN
1650.0000 [IU]/h | INTRAVENOUS | Status: DC
  Administered 2023-10-23 – 2023-10-24 (×2): 1600 [IU]/h via INTRAVENOUS
  Filled 2023-10-23 (×3): qty 250

## 2023-10-23 MED ORDER — SODIUM CHLORIDE 0.9 % IV SOLN
50.0000 mg/m2 | Freq: Once | INTRAVENOUS | Status: AC
  Administered 2023-10-23: 100 mg via INTRAVENOUS
  Filled 2023-10-23: qty 5

## 2023-10-23 MED ORDER — VINCRISTINE SULFATE CHEMO INJECTION 1 MG/ML
1.0000 mg | Freq: Once | INTRAVENOUS | Status: AC
  Administered 2023-10-23: 1 mg via INTRAVENOUS
  Filled 2023-10-23: qty 1

## 2023-10-23 MED ORDER — SODIUM CHLORIDE 0.9 % IV SOLN
400.0000 mg/m2 | Freq: Once | INTRAVENOUS | Status: AC
  Administered 2023-10-23: 720 mg via INTRAVENOUS
  Filled 2023-10-23: qty 36

## 2023-10-23 MED ORDER — ONDANSETRON HCL 4 MG PO TABS: 8.0000 mg | ORAL_TABLET | Freq: Three times a day (TID) | ORAL | Status: DC | PRN

## 2023-10-23 MED ORDER — ACETAMINOPHEN 325 MG PO TABS: 650.0000 mg | ORAL_TABLET | Freq: Once | ORAL | Status: DC

## 2023-10-23 MED ORDER — DIPHENHYDRAMINE HCL 25 MG PO CAPS: 50.0000 mg | ORAL_CAPSULE | Freq: Once | ORAL | Status: DC

## 2023-10-23 MED ORDER — PREDNISONE 20 MG PO TABS
60.0000 mg | ORAL_TABLET | Freq: Every day | ORAL | Status: DC
  Administered 2023-10-24 – 2023-10-25 (×2): 60 mg via ORAL
  Filled 2023-10-23 (×2): qty 3

## 2023-10-23 MED ORDER — PALONOSETRON HCL INJECTION 0.25 MG/5ML
0.2500 mg | Freq: Once | INTRAVENOUS | Status: AC
  Administered 2023-10-23: 0.25 mg via INTRAVENOUS
  Filled 2023-10-23: qty 5

## 2023-10-23 MED ORDER — PROCHLORPERAZINE MALEATE 10 MG PO TABS
5.0000 mg | ORAL_TABLET | Freq: Three times a day (TID) | ORAL | Status: DC | PRN
  Filled 2023-10-23: qty 1

## 2023-10-23 MED ORDER — LIDOCAINE-EPINEPHRINE 1 %-1:100000 IJ SOLN
INTRAMUSCULAR | Status: AC
  Filled 2023-10-23: qty 1

## 2023-10-23 MED ORDER — SODIUM CHLORIDE 0.9 % IV SOLN: INTRAVENOUS | Status: DC

## 2023-10-23 NOTE — Progress Notes (Signed)
 Implanted port site assessed by Velinda Borer, RN and by me prior to chemotherapy administration. Port site appears swollen. Port deaccessed and dark blood immediately oozed from site. Appears patient may have a hematoma at the site. Did not attempt port reaccess. Gauze pressure dressing applied and Barnie, RN notified. She notified MD and order for IR consult received. Tim to discuss with Dr. [name] to determine best way to proceed with chemotherapy administration.

## 2023-10-23 NOTE — Progress Notes (Addendum)
 Ok to proceed with tx today using ANC=8.7 from 8/2 per Dr. [name]. APAP and Benadryl  withheld from premeds today, as pt will receive rituximab  outpatient. Compazine  PO 5mg  q8h prn ordered as alternative antiemetic for Zofran , as pt is receiving Aloxi  as premed.  Shonita Rinck, PharmD, MBA

## 2023-10-23 NOTE — Progress Notes (Signed)
 Arrived in room to find patient awake and alert sitting upright in bed. Informed patient that chemotherapy had been ordered to be given today. Patient agreeable and advised for me to speak with his daughter. Spoke with his daughter via telephone who gave verbal consent to proceed with chemotherapy. Most recent labs, imaging, and progress notes reviewed. No concerns identified. Initial visual assessment of his port-a-cath [facility]) revealed apparent swelling under dressing/around insertion site. + blood return and flushes easily. Patient denies pain at site. PAC deaccessed to be able to better assess. When deaccessed, blood began running out of site. When pressing over the PAC, additional blood was expressed. Discussed with primary RN who called interventional radiology to assess. I spoke with Dr. [name] who advised that we should not use PAC. Dr. [name] stated this patient is not a candidate for PICC line placement due to bacteremia. Discussed with Dr. [name] the risks and benefits of administering the treatment through a peripheral IV. Decision was made to proceed with IV administration if suitable access could be achieved. New IV site obtained as documented. Informed patient of our plan and was agreeable to proceed with treatment with peripheral IV.

## 2023-10-23 NOTE — Progress Notes (Addendum)
 PHARMACY - ANTICOAGULATION CONSULT NOTE  Pharmacy Consult for Heparin  Indication: DVT  No Known Allergies  Patient Measurements: Height: 5' 6 (167.6 cm) Weight: 65.7 kg (144 lb 13.5 oz) IBW/kg (Calculated) : 63.8 HEPARIN  DW (KG): 64  Vital Signs: Temp: 97.7 F (36.5 C) (08/07 0400) Temp Source: Oral (08/07 0400) BP: 114/41 (08/07 0600) Pulse Rate: 100 (08/07 0600)  Labs: Recent Labs    10/21/23 0752 10/21/23 1939 10/22/23 0504 10/23/23 0500  HGB 10.2*  --  9.9* 10.3*  HCT 33.6*  --  32.2* 32.3*  PLT 180  --  192 205  HEPARINUNFRC 0.38 0.41 0.30 0.44  CREATININE 0.84  --  0.74 0.75    Estimated Creatinine Clearance: 59.8 mL/min (by C-G formula based on SCr of 0.75 mg/dL).  Assessment: Pharmacy consulted to manage IV heparin  for ACS/STEMI.  Evaluated by cardiology for chest pain and increasingly elevated troponins - no further chest pain and no plans for cardiac catheterization. Dopplers + age indeterminate LLE DVT.  He was previously on apixaban  for DVT, but last filled 03/09/2023 and no longer taking outpatient.    Today, 10/23/2023:  Heparin  level  0.44, therapeutic on heparin  1600 units/hr CBC: Hgb low/stable at 10., Plt WNL No active bleeding or complications noted.  Pt with bleeding/oozing at [facility] 8/4> >> RN applied surgicel pad with improvement.  No further bleeding at [facility] site reported.  Minor/old bleeding around pIV sites.    Goal of Therapy:  Heparin  level 0.3-0.7 units/ml Monitor platelets by anticoagulation protocol: Yes   Plan:  - Continue heparin  drip at 1600 units/hr   - Monitor daily heparin  level, CBC, signs/symptoms of bleeding - f/u transition to DOAC once no invasive procedures planned (may need PAC removal)  Wanda Hasting PharmD, BCPS [facility] main pharmacy [phone] 10/23/2023 7:22 AM   Addendum:  During preparation for chemo today, RN notes hematoma around the port itself that is oozing.   Heparin  is on hold pending evaluation /  plan.  Wanda Hasting PharmD, BCPS [facility] main pharmacy [phone] 10/23/2023 8:38 AM

## 2023-10-23 NOTE — Plan of Care (Signed)

## 2023-10-23 NOTE — Progress Notes (Signed)
  Progress Note  Patient Name: Harold [name] Date of Encounter: 10/23/2023 [facility] Cardiologist: Lonni Cash, MD   Interval Summary   Issues with a hematoma around his port this AM IR is involved Otherwise, patient feels okay With soft BP, will hold off on Lasix  for today   Vital Signs Vitals:   10/23/23 0400 10/23/23 0500 10/23/23 0600 10/23/23 0800  BP: (!) 135/54 (!) 131/51 (!) 114/41 (!) 106/39  Pulse: 98 (!) 102 100 96  Resp: 16 12 15 15   Temp: 97.7 F (36.5 C)   99.5 F (37.5 C)  TempSrc: Oral   Oral  SpO2: 97% 98% 95% 94%  Weight:  65.7 kg    Height:        Intake/Output Summary (Last 24 hours) at 10/23/2023 0950 Last data filed at 10/23/2023 0932 Gross per 24 hour  Intake 1075.37 ml  Output 2625 ml  Net -1549.63 ml      10/23/2023    5:00 AM 10/21/2023    5:00 AM 10/20/2023    6:00 AM  Last 3 Weights  Weight (lbs) 144 lb 13.5 oz 142 lb 10.2 oz 141 lb 1.5 oz  Weight (kg) 65.7 kg 64.7 kg 64 kg     Telemetry/ECG  Sinus rhythm, HR 90-100s - Personally Reviewed  Physical Exam  GEN: No acute distress.   Neck: large asymmetric right facial swelling  Cardiac: RRR, no murmurs, rubs, or gallops.  Respiratory: Clear to auscultation bilaterally. GI: Soft, nontender, non-distended  MS: mild edema  Assessment & Plan  History of CAD  NSTEMI  Presented with chest pain  Troponin 18 > 48 > 421 > 610 > 735 > 575 > 539 Suspect demand ischemic in the setting of sepsis, hypotension, tachycardia  No recurrent chest pain, shortness of breath LHC from 01/2023 showed: severe mid LAD stenosis s/p lithotripsy, mild LCX stenosis, mild-moderate RCA stenosis  There are no plans for invasive cardiac evaluation  Continue ASA 81 mg daily Continue atenolol  12.5 mg daily Continue pravastatin  40 mg daily    Chronic HFpEF Echo this admission: EF 60-65%, G2DD, normal RV function, mild MS, moderate TR, mild to moderate AS, normal IVC Given IV fluids for septic shock   BNP 625 > 165 Urine output 2.7 L yesterday, continues to receive IV fluids  Creatinine stable  Hypotensive and tachycardic this morning  Continue strict I&O's, daily weights, daily BMPs Hold off on additional Lasix  today, monitor and reassess need  Continue atenolol  12.5 mg daily   LLE 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

## 2023-10-23 NOTE — Progress Notes (Signed)
 Harold [name]   DOB:29-Dec-1934   FM#:[id number]      ASSESSMENT & PLAN:  [name]. Harold [name]  is an 89 year old male patient with oncologic history significant for follicular lymphoma.  Patient was discharged from inpatient on 10/16/23 and was scheduled to start chemotherapy 10/20/22.  Readmitted on 10/18/23 with complaints of chest pain, elevated troponin, bacteremia.       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 Likely progression to high-grade. --LN biopsy of right axilla done 6/11, showed low grade FL. - Facial mass biopsy done 7/30.  Morphologic features consistent with DLBCL.  -- CT neck 7/29 shows very large up to 12 cm lymphomatous mass about the right mandible, no other lymphadenopathy in the neck or thoracic inlet.  --Port placed 7/30 for chemotherapy -->per ID okay to use however close monitoring for fever and chills. -Chemotherapy R-CEOP was scheduled outpatient 8/4.  For CEOP chemo regimen 8/7 with etoposide  days 2 and 3.  Day 1 being administered today, patient tolerating well.  Patient's daughter bringing in oral etoposide  which was sent to patient's home.  -- IV Rituximab  planned for outpatient. - Discussed with cardio, okay to proceed with treatment. - Medical oncology/Dr. [name] following closely.     Septic shock Bacteremia RLE cellulitis - Admitted on 10/18/2023 with fever and weakness. - Blood cultures positive for strep on 8/2.  Repeat 8/3 with NGTD. Neg for MRSA. Urine cx neg. Resp panel neg.   -- Continue IV antibiotics as ordered.   NSTEMI History of CAD - Patient with elevated troponin, decreasing - Denies further chest pain, states he has not had any since admission.   - Cardiology following closely    Anemia, normocytic - Mild - Hemoglobin stable 10.3 -No transfusional intervention required at this time - Continue to  monitor CBC with differential   History of melanoma - On chin - Status post resection   History of head and neck basal cell carcinoma - Continue close follow-up with Derm   History of DVT - LLE, age indeterminate -Patient reports being on Eliquis  on and off for several years - IV heparin  given, for transition to DOAC  New Hematoma -- site of port, likely to be removed -- IR following      Code Status DNR-Limited  Subjective:  Patient seen laying in bed resting comfortably.  Chemo nursing staff at bedside.  Etoposide  is infusing well via peripheral IV.  No acute distress is noted.  Objective:   Intake/Output Summary (Last 24 hours) at 10/23/2023 1048 Last data filed at 10/23/2023 1040 Gross per 24 hour  Intake 1075.37 ml  Output 2325 ml  Net -1249.63 ml     PHYSICAL EXAMINATION: ECOG PERFORMANCE STATUS: 3 - Symptomatic, >50% confined to bed  Vitals:   10/23/23 0600 10/23/23 0800  BP: (!) 114/41 (!) 106/39  Pulse: 100 96  Resp: 15 15  Temp:  99.5 F (37.5 C)  SpO2: 95% 94%   Filed Weights   10/20/23 0600 10/21/23 0500 10/23/23 0500  Weight: 141 lb 1.5 oz (64 kg) 142 lb 10.2 oz (64.7 kg) 144 lb 13.5 oz (65.7 kg)    GENERAL: alert, + Right-sided facial mass SKIN: + RLE erythema and 1+ edema, skin color, texture, turgor are normal, no rashes or significant lesions EYES: normal, conjunctiva are pink and non-injected, sclera clear OROPHARYNX: no exudate, no erythema and lips, buccal mucosa, and tongue normal  NECK:  supple, thyroid  normal size, non-tender, without nodularity LYMPH: no palpable lymphadenopathy in the cervical, axillary or inguinal LUNGS: clear to auscultation and percussion with normal breathing effort HEART: regular rate & rhythm and no murmurs and no lower extremity edema ABDOMEN: abdomen soft, non-tender and normal bowel sounds MUSCULOSKELETAL: no cyanosis of digits and no clubbing  PSYCH: alert & oriented x 3 with fluent speech NEURO: no focal  motor/sensory deficits   All questions were answered. The patient knows to call the clinic with any problems, questions or concerns.   The total time spent in the appointment was 40 minutes encounter with patient including review of chart and various tests results, discussions about plan of care and coordination of care plan  Harold JINNY Brunner, NP 10/23/2023 10:48 AM    Labs Reviewed:  Lab Results  Component Value Date   WBC 10.2 10/23/2023   HGB 10.3 (L) 10/23/2023   HCT 32.3 (L) 10/23/2023   MCV 87.8 10/23/2023   PLT 205 10/23/2023   Recent Labs    09/29/23 1120 10/13/23 1212 10/16/23 0700 10/21/23 0752 10/22/23 0504 10/23/23 0500  NA 140 143   < > 137 133* 136  K 4.0 3.6   < > 3.8 3.6 3.6  CL 103 103   < > 101 97* 98  CO2 32 35*   < > 25 28 28   GLUCOSE 88 85   < > 86 90 103*  BUN 18 22   < > 17 18 18   CREATININE 0.96 1.00   < > 0.84 0.74 0.75  CALCIUM 8.9 8.9   < > 8.2* 7.9* 8.3*  GFRNONAA >60 >60   < > >60 >60 >60  PROT 5.9* 6.1*  --   --   --  5.2*  ALBUMIN 3.6 3.6  --   --   --  2.6*  AST 22 15  --   --   --  17  ALT 16 10  --   --   --  11  ALKPHOS 51 53  --   --   --  45  BILITOT 0.7 0.5  --   --   --  0.6   < > = values in this interval not displayed.    Studies Reviewed:  CT SOFT TISSUE NECK W CONTRAST Result Date: 10/20/2023 CLINICAL DATA:  Right neck mass EXAM: CT NECK WITH CONTRAST TECHNIQUE: Multidetector CT imaging of the neck was performed using the standard protocol following the bolus administration of intravenous contrast. RADIATION DOSE REDUCTION: This exam was performed according to the departmental dose-optimization program which includes automated exposure control, adjustment of the mA and/or kV according to patient size and/or use of iterative reconstruction technique. CONTRAST:  75mL OMNIPAQUE  IOHEXOL  300 MG/ML  SOLN COMPARISON:  10/14/2023 FINDINGS: Again noted is a large mass superficial to the mandible in the right-side of the neck. There is no  hemorrhage. Pharynx: The nasopharynx, oropharynx and hypopharynx are normal Oral cavity/floor of mouth: Normal Larynx: Normal Salivary glands: The parotid and submandibular glands are normal Thyroid : Normal Lymph nodes: No adenopathy Vascular: No significant abnormality Limited intracranial: No significant abnormality Visualized orbits: No significant abnormality Mastoids and visualized paranasal sinuses: No significant abnormality Skeleton: No significant abnormality Upper chest: No significant abnormality Other: None IMPRESSION: Approximally 11.6 by 4 x 11 cm mass in the right side of the face/neck unchanged from the prior study. No hemorrhage. Electronically Signed   By: Nancyann Burns M.D.   On: 10/20/2023 15:08  VAS US  LOWER EXTREMITY VENOUS (DVT) Result Date: 10/19/2023  Lower Venous DVT Study Patient Name:  ANTOLIN [name]  Date of Exam:   10/18/2023 Medical Rec #: [id number]       Accession #:    [id number] Date of Birth: [birth year]       Patient Gender: M Patient Age:   33 years Exam Location:  [facility] Procedure:      VAS US  LOWER EXTREMITY VENOUS (DVT) Referring Phys: LAMAR CHRIS --------------------------------------------------------------------------------  Indications: Right > L Edema.  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 Chronic HFpEF. Limitations: Pitting edema, involuntary patient movement. Comparison Study: Prior bilateral LEV done 05/05/20 indicating chronic left                   peroneal DVT Performing Technologist: Alberta Lis RVS  Examination Guidelines: A complete evaluation includes B-mode imaging, spectral Doppler, color Doppler, and power Doppler as needed of all accessible portions of each vessel. Bilateral testing is considered an integral part of a complete examination. Limited examinations for reoccurring indications may be performed as noted. The reflux portion of the exam is performed  with the patient in reverse Trendelenburg.  +---------+---------------+---------+-----------+----------+--------------+ RIGHT    CompressibilityPhasicitySpontaneityPropertiesThrombus Aging +---------+---------------+---------+-----------+----------+--------------+ CFV      Full           Yes      No                                  +---------+---------------+---------+-----------+----------+--------------+ SFJ      Full                                                        +---------+---------------+---------+-----------+----------+--------------+ FV Prox  Full           Yes      No                                  +---------+---------------+---------+-----------+----------+--------------+ FV Mid   Full                                                        +---------+---------------+---------+-----------+----------+--------------+ FV DistalFull                                                        +---------+---------------+---------+-----------+----------+--------------+ PFV      Full           Yes      No                                  +---------+---------------+---------+-----------+----------+--------------+ POP      Full           Yes      No                                  +---------+---------------+---------+-----------+----------+--------------+  PTV      Full                                                        +---------+---------------+---------+-----------+----------+--------------+ PERO     Full                                                        +---------+---------------+---------+-----------+----------+--------------+   +---------+---------------+---------+-----------+----------+-------------------+ LEFT     CompressibilityPhasicitySpontaneityPropertiesThrombus Aging      +---------+---------------+---------+-----------+----------+-------------------+ CFV      Full           Yes      No                                        +---------+---------------+---------+-----------+----------+-------------------+ SFJ      Full                                                             +---------+---------------+---------+-----------+----------+-------------------+ FV Prox  Full           Yes      No                                       +---------+---------------+---------+-----------+----------+-------------------+ FV Mid   Full                                                             +---------+---------------+---------+-----------+----------+-------------------+ FV DistalFull           Yes      No                                       +---------+---------------+---------+-----------+----------+-------------------+ PFV      Full                                                             +---------+---------------+---------+-----------+----------+-------------------+ POP                     Yes      No                   patent by color and  Doppler             +---------+---------------+---------+-----------+----------+-------------------+ PTV      None                                         Age Indeterminate   +---------+---------------+---------+-----------+----------+-------------------+ PERO     None                                         Age Indeterminate   +---------+---------------+---------+-----------+----------+-------------------+     Summary: RIGHT: - There is no evidence of deep vein thrombosis in the lower extremity.  Interstitial edema noted throughout the extremity  LEFT: - Findings consistent with age indeterminate deep vein thrombosis involving the left posterior tibial veins, and left peroneal veins.  Interstitial edema noted throughout the extremity.  *See table(s) above for measurements and observations. Electronically signed by Debby Robertson on 10/19/2023 at 3:36:23 PM.    Final     ECHOCARDIOGRAM COMPLETE Result Date: 10/19/2023    ECHOCARDIOGRAM REPORT   Patient Name:   Harold [name] Date of Exam: 10/19/2023 Medical Rec #:  [id number]      Height:       66.0 in Accession #:    [id number]     Weight:       153.9 lb Date of Birth:  09-17-1934      BSA:          1.789 m Patient Age:    88 years       BP:           111/48 mmHg Patient Gender: M              HR:           69 bpm. Exam Location:  Inpatient Procedure: 2D Echo, Cardiac Doppler, Color Doppler, Strain Analysis and 3D Echo            (Both Spectral and Color Flow Doppler were utilized during            procedure). Indications:    Shock  History:        Patient has prior history of Echocardiogram examinations, most                 recent 10/15/2023. CAD and Previous Myocardial Infarction, Mitral                 Valve Disease and Aortic Valve Disease; Risk                 Factors:Hypertension and Dyslipidemia.  Sonographer:    Therisa Crouch Referring Phys: [id number] ROBERT S BYRUM IMPRESSIONS  1. No new RWMA;s since TTE done 10/15/23. Left ventricular ejection fraction, by estimation, is 60 to 65%. The left ventricle has normal function. The left ventricle has no regional wall motion abnormalities. Left ventricular diastolic parameters are consistent with Grade II diastolic dysfunction (pseudonormalization). Elevated left ventricular end-diastolic pressure. The average left ventricular global longitudinal strain is -19.9 %. The global longitudinal strain is normal.  2. Right ventricular systolic function is normal. The right ventricular size is normal.  3. Right atrial size was mildly dilated.  4. The mitral valve is degenerative. Trivial mitral valve regurgitation. Mild mitral stenosis. Moderate mitral annular calcification.  5. Tricuspid valve regurgitation is moderate.  6. The aortic valve is tricuspid. There is moderate calcification of the aortic valve. There is moderate thickening of the aortic valve. Aortic valve regurgitation is not  visualized. Mild to moderate aortic valve stenosis.  7. The inferior vena cava is normal in size with greater than 50% respiratory variability, suggesting right atrial pressure of 3 mmHg. FINDINGS  Left Ventricle: No new RWMA;s since TTE done 10/15/23. Left ventricular ejection fraction, by estimation, is 60 to 65%. The left ventricle has normal function. The left ventricle has no regional wall motion abnormalities. The average left ventricular global longitudinal strain is -19.9 %. Strain was performed and the global longitudinal strain is normal. The left ventricular internal cavity size was normal in size. There is no left ventricular hypertrophy. Left ventricular diastolic parameters are consistent with Grade II diastolic dysfunction (pseudonormalization). Elevated left ventricular end-diastolic pressure. Right Ventricle: The right ventricular size is normal. No increase in right ventricular wall thickness. Right ventricular systolic function is normal. Left Atrium: Left atrial size was normal in size. Right Atrium: Right atrial size was mildly dilated. Pericardium: There is no evidence of pericardial effusion. Mitral Valve: The mitral valve is degenerative in appearance. Moderate mitral annular calcification. Trivial mitral valve regurgitation. Mild mitral valve stenosis. MV peak gradient, 8.9 mmHg. The mean mitral valve gradient is 5.0 mmHg. Tricuspid Valve: The tricuspid valve is normal in structure. Tricuspid valve regurgitation is moderate . No evidence of tricuspid stenosis. Aortic Valve: The aortic valve is tricuspid. There is moderate calcification of the aortic valve. There is moderate thickening of the aortic valve. Aortic valve regurgitation is not visualized. Mild to moderate aortic stenosis is present. Aortic valve mean gradient measures 11.7 mmHg. Aortic valve peak gradient measures 20.4 mmHg. Aortic valve area, by VTI measures 1.32 cm. Pulmonic Valve: The pulmonic valve was normal in structure.  Pulmonic valve regurgitation is trivial. No evidence of pulmonic stenosis. Aorta: The aortic root is normal in size and structure. Venous: The inferior vena cava is normal in size with greater than 50% respiratory variability, suggesting right atrial pressure of 3 mmHg. IAS/Shunts: No atrial level shunt detected by color flow Doppler. Additional Comments: 3D was performed not requiring image post processing on an independent workstation and was indeterminate.  LEFT VENTRICLE PLAX 2D LVIDd:         3.80 cm   Diastology LVIDs:         2.50 cm   LV e' medial:    6.64 cm/s LV PW:         0.90 cm   LV E/e' medial:  17.3 LV IVS:        0.90 cm   LV e' lateral:   7.83 cm/s LVOT diam:     2.00 cm   LV E/e' lateral: 14.7 LV SV:         66 LV SV Index:   37        2D Longitudinal Strain LVOT Area:     3.14 cm  2D Strain GLS Avg:     -19.9 %                           3D Volume EF                          LV 3D EDV:   103.42 ml  LV 3D ESV:   39.24 ml RIGHT VENTRICLE             IVC RV Basal diam:  3.60 cm     IVC diam: 3.00 cm RV S prime:     13.30 cm/s TAPSE (M-mode): 2.4 cm LEFT ATRIUM             Index        RIGHT ATRIUM           Index LA diam:        3.60 cm 2.01 cm/m   RA Area:     20.60 cm LA Vol (A2C):   77.6 ml 43.38 ml/m  RA Volume:   55.40 ml  30.97 ml/m LA Vol (A4C):   75.6 ml 42.26 ml/m LA Biplane Vol: 77.3 ml 43.21 ml/m  AORTIC VALVE AV Area (Vmax):    1.27 cm AV Area (Vmean):   1.23 cm AV Area (VTI):     1.32 cm AV Vmax:           226.00 cm/s AV Vmean:          158.333 cm/s AV VTI:            0.505 m AV Peak Grad:      20.4 mmHg AV Mean Grad:      11.7 mmHg LVOT Vmax:         91.05 cm/s LVOT Vmean:        62.100 cm/s LVOT VTI:          0.212 m LVOT/AV VTI ratio: 0.42  AORTA Ao Root diam: 3.50 cm Ao Asc diam:  3.20 cm MITRAL VALVE                TRICUSPID VALVE MV Area (PHT): 3.17 cm     TR Peak grad:   34.8 mmHg MV Area VTI:   1.32 cm     TR Vmax:        295.00 cm/s MV Peak  grad:  8.9 mmHg MV Mean grad:  5.0 mmHg     SHUNTS MV Vmax:       1.49 m/s     Systemic VTI:  0.21 m MV Vmean:      106.5 cm/s   Systemic Diam: 2.00 cm MV Decel Time: 239 msec MV E velocity: 115.00 cm/s MV A velocity: 130.00 cm/s MV E/A ratio:  0.88 Maude Emmer MD Electronically signed by Maude Emmer MD Signature Date/Time: 10/19/2023/10:56:55 AM    Final    DG Chest Port 1 View Result Date: 10/18/2023 EXAM: 1 VIEW XRAY OF THE CHEST 10/18/2023 02:13:00 AM COMPARISON: 01/01/2011 CLINICAL HISTORY: 355200 Chest pain [id number]. Per chart: [facility] for chest pain and fever that started last night. Patient was seen here earlier this week and had port implanted for chemotherapy related to Non-Hodgkin's lymphoma. Patient given 324 ASA and 1 inch nitropaste from EMS. Denies any pain at this time. FINDINGS: LUNGS AND PLEURA: No focal pulmonary opacity. No pulmonary edema. No pleural effusion. No pneumothorax. HEART AND MEDIASTINUM: No acute abnormality of the cardiac and mediastinal silhouettes. BONES AND SOFT TISSUES: No acute osseous abnormality. Right chest wall port-a-cath with tip in the lower SVC. IMPRESSION: 1. No acute process. Electronically signed by: Franky Stanford MD 10/18/2023 02:24 AM EDT RP Workstation: [id number]   ECHOCARDIOGRAM COMPLETE Result Date: 10/15/2023    ECHOCARDIOGRAM REPORT   Patient Name:   Harold [name] Date of Exam: 10/15/2023 Medical Rec #:  [id number]  Height:       66.0 in Accession #:    [id number]     Weight:       155.3 lb Date of Birth:  08-20-1934      BSA:          1.796 m Patient Age:    88 years       BP:           95/50 mmHg Patient Gender: M              HR:           68 bpm. Exam Location:  Inpatient Procedure: 2D Echo, 3D Echo, Strain Analysis, Cardiac Doppler and Color Doppler            (Both Spectral and Color Flow Doppler were utilized during            procedure). Indications:    CAD native vessel I25.10  History:        Patient has prior history of Echocardiogram  examinations, most                 recent 01/29/2023. CAD and Previous Myocardial Infarction,                 Mitral Valve Disease; Risk Factors:Hypertension and                 Dyslipidemia.  Sonographer:    Koleen Popper RDCS Referring Phys: [phone] ANAND D HONGALGI  Sonographer Comments: Global longitudinal strain was attempted. IMPRESSIONS  1. Left ventricular ejection fraction, by estimation, is 60 to 65%. Left ventricular ejection fraction by 3D volume is 62 %. The left ventricle has normal function. The left ventricle has no regional wall motion abnormalities. Left ventricular diastolic  parameters are consistent with Grade II diastolic dysfunction (pseudonormalization). The average left ventricular global longitudinal strain is -19.9 %. The global longitudinal strain is normal.  2. Right ventricular systolic function is mildly reduced. The right ventricular size is mildly enlarged. There is mildly elevated pulmonary artery systolic pressure. The estimated right ventricular systolic pressure is 41.6 mmHg.  3. The mitral valve is normal in structure. Mild mitral valve regurgitation. Mild mitral stenosis. Severe mitral annular calcification.  4. The tricuspid valve is degenerative. Tricuspid valve regurgitation is moderate.  5. The aortic valve has an indeterminant number of cusps. There is severe calcifcation of the aortic valve. Aortic valve regurgitation is mild. Mild aortic valve stenosis. Aortic valve Vmax measures 2.17 m/s.  6. The inferior vena cava is dilated in size with <50% respiratory variability, suggesting right atrial pressure of 15 mmHg. FINDINGS  Left Ventricle: Left ventricular ejection fraction, by estimation, is 60 to 65%. Left ventricular ejection fraction by 3D volume is 62 %. The left ventricle has normal function. The left ventricle has no regional wall motion abnormalities. The average left ventricular global longitudinal strain is -19.9 %. Strain was performed and the global longitudinal  strain is normal. The left ventricular internal cavity size was normal in size. There is no left ventricular hypertrophy. Left ventricular diastolic parameters are consistent with Grade II diastolic dysfunction (pseudonormalization). Right Ventricle: The right ventricular size is mildly enlarged. No increase in right ventricular wall thickness. Right ventricular systolic function is mildly reduced. There is mildly elevated pulmonary artery systolic pressure. The tricuspid regurgitant  velocity is 2.58 m/s, and with an assumed right atrial pressure of 15 mmHg, the estimated right ventricular systolic pressure is 41.6 mmHg. Left Atrium: Left atrial  size was normal in size. Right Atrium: Right atrial size was normal in size. Pericardium: There is no evidence of pericardial effusion. Mitral Valve: The mitral valve is normal in structure. Severe mitral annular calcification. Mild mitral valve regurgitation. Mild mitral valve stenosis. MV peak gradient, 12.7 mmHg. The mean mitral valve gradient is 4.0 mmHg. Tricuspid Valve: The tricuspid valve is degenerative in appearance. Tricuspid valve regurgitation is moderate . No evidence of tricuspid stenosis. Aortic Valve: The aortic valve has an indeterminant number of cusps. There is severe calcifcation of the aortic valve. Aortic valve regurgitation is mild. Mild aortic stenosis is present. Aortic valve mean gradient measures 11.0 mmHg. Aortic valve peak gradient measures 18.8 mmHg. Aortic valve area, by VTI measures 1.07 cm. Pulmonic Valve: The pulmonic valve was grossly normal. Pulmonic valve regurgitation is mild. No evidence of pulmonic stenosis. Aorta: The aortic root is normal in size and structure. Venous: The inferior vena cava is dilated in size with less than 50% respiratory variability, suggesting right atrial pressure of 15 mmHg. IAS/Shunts: No atrial level shunt detected by color flow Doppler. Additional Comments: 3D was performed not requiring image post  processing on an independent workstation and was normal.  LEFT VENTRICLE PLAX 2D LVIDd:         3.60 cm         Diastology LVIDs:         2.30 cm         LV e' medial:    6.20 cm/s LV PW:         1.00 cm         LV E/e' medial:  18.9 LV IVS:        1.00 cm         LV e' lateral:   5.77 cm/s LVOT diam:     1.50 cm         LV E/e' lateral: 20.3 LV SV:         46 LV SV Index:   26              2D Longitudinal LVOT Area:     1.77 cm        Strain                                2D Strain GLS   -19.9 %                                Avg:                                 3D Volume EF                                LV 3D EF:    Left                                             ventricul  ar                                             ejection                                             fraction                                             by 3D                                             volume is                                             62 %.                                 3D Volume EF:                                3D EF:        62 %                                LV EDV:       93 ml                                LV ESV:       35 ml                                LV SV:        58 ml RIGHT VENTRICLE             IVC RV Basal diam:  4.30 cm     IVC diam: 2.40 cm RV Mid diam:    3.80 cm RV S prime:     14.90 cm/s TAPSE (M-mode): 2.0 cm LEFT ATRIUM             Index        RIGHT ATRIUM           Index LA diam:        3.30 cm 1.84 cm/m   RA Area:     12.80 cm LA Vol (A2C):   37.9 ml 21.10 ml/m  RA Volume:   29.90 ml  16.65 ml/m LA Vol (A4C):   35.3 ml 19.65 ml/m LA Biplane Vol: 39.5 ml 21.99 ml/m  AORTIC VALVE AV Area (Vmax):    1.07 cm AV Area (Vmean):   0.94 cm AV Area (VTI):     1.07 cm  AV Vmax:           217.00 cm/s AV Vmean:          157.000 cm/s AV VTI:            0.432 m AV Peak Grad:      18.8 mmHg AV Mean Grad:      11.0 mmHg LVOT Vmax:         131.00 cm/s LVOT  Vmean:        83.200 cm/s LVOT VTI:          0.261 m LVOT/AV VTI ratio: 0.60  AORTA Ao Root diam: 2.70 cm Ao Asc diam:  3.30 cm MITRAL VALVE                TRICUSPID VALVE MV Area (PHT): 2.16 cm     TR Peak grad:   26.6 mmHg MV Area VTI:   1.02 cm     TR Vmax:        258.00 cm/s MV Peak grad:  12.7 mmHg MV Mean grad:  4.0 mmHg     SHUNTS MV Vmax:       1.78 m/s     Systemic VTI:  0.26 m MV Vmean:      95.9 cm/s    Systemic Diam: 1.50 cm MV Decel Time: 351 msec MV E velocity: 117.00 cm/s MV A velocity: 149.00 cm/s MV E/A ratio:  0.79 Morene Brownie Electronically signed by Morene Brownie Signature Date/Time: 10/15/2023/6:24:54 PM    Final    IR IMAGING GUIDED PORT INSERTION Result Date: 10/15/2023 INDICATION: 89 year old male with a known history of low-grade follicular lymphoma with enlarging face all mass concerning for transformation to high-grade lymphoma. He presents for port catheter placement to establish durable venous access in preparation for chemotherapy as well as ultrasound-guided core biopsy of the facial mass. EXAM: IMPLANTED PORT A CATH PLACEMENT WITH ULTRASOUND AND FLUOROSCOPIC GUIDANCE ULTRASOUND-GUIDED CORE BIOPSY MEDICATIONS: None. ANESTHESIA/SEDATION: Versed  2 mg IV; Fentanyl  100 mcg IV; administered by the radiology nurse Moderate Sedation Time:  42 minutes The patient's vital signs and level of consciousness were continuously monitored during the procedure by the interventional radiology nurse under my direct supervision. FLUOROSCOPY: Radiation exposure index: 3 mGy reference air kerma COMPLICATIONS: None immediate. PROCEDURE: The right neck and chest was prepped with chlorhexidine , and draped in the usual sterile fashion using maximum barrier technique (cap and mask, sterile gown, sterile gloves, large sterile sheet, hand hygiene and cutaneous antiseptic). Local anesthesia was attained by infiltration with 1% lidocaine  with epinephrine . Ultrasound demonstrated patency of the right  internal jugular vein, and this was documented with an image. Under real-time ultrasound guidance, this vein was accessed with a 21 gauge micropuncture needle and image documentation was performed. A small dermatotomy was made at the access site with an 11 scalpel. A 0.018 wire was advanced into the SVC and the access needle exchanged for a 24F micropuncture vascular sheath. The 0.018 wire was then removed and a 0.035 wire advanced into the IVC. An appropriate location for the subcutaneous reservoir was selected below the clavicle and an incision was made through the skin and underlying soft tissues. The subcutaneous tissues were then dissected using a combination of blunt and sharp surgical technique and a pocket was formed. A Bard Clear Vue single lumen power injectable portacatheter was then tunneled through the subcutaneous tissues from the pocket to the dermatotomy and the port reservoir placed within the subcutaneous pocket. The venous access site was then serially  dilated and a peel away vascular sheath placed over the wire. The wire was removed and the port catheter advanced into position under fluoroscopic guidance. The catheter tip is positioned in the superior cavoatrial junction. This was documented with a spot image. The portacatheter was then tested and found to flush and aspirate well. The port was flushed with saline followed by 100 units/mL heparinized saline. The pocket was then closed in two layers using first subdermal inverted interrupted absorbable sutures followed by a running subcuticular suture. The epidermis was then sealed with Dermabond. The dermatotomy at the venous access site was also closed with Dermabond. The port catheter was left accessed and sterile bandages were applied. Next, the facial mass was evaluated. The mass was interrogated with ultrasound. A suitable biopsy entry site was identified in the submental space allowing passage through relatively normal soft tissues in a  region of relatively low vascularity within the mass. The skin was sterilely prepped and draped in the usual fashion using chlorhexidine  skin prep. Local anesthesia was attained by infiltration with 1% lidocaine . A small dermatotomy was made. Under real-time ultrasound guidance, multiple 18 gauge core biopsies were obtained using the Argon automated biopsy device. Biopsy specimens were placed on a moistened Telfa pad and submitted to pathology for further analysis. Post biopsy imaging demonstrates no evidence of active bleeding or other complication. IMPRESSION: Successful placement of a right IJ approach Bard Clear Vue port catheter with ultrasound and fluoroscopic guidance. The catheter is ready for use. Successful ultrasound-guided biopsy of right facial mass. Electronically Signed   By: Wilkie Lent M.D.   On: 10/15/2023 11:35   IR US  Guide Bx Asp/Drain Result Date: 10/15/2023 INDICATION: 89 year old male with a known history of low-grade follicular lymphoma with enlarging face all mass concerning for transformation to high-grade lymphoma. He presents for port catheter placement to establish durable venous access in preparation for chemotherapy as well as ultrasound-guided core biopsy of the facial mass. EXAM: IMPLANTED PORT A CATH PLACEMENT WITH ULTRASOUND AND FLUOROSCOPIC GUIDANCE ULTRASOUND-GUIDED CORE BIOPSY MEDICATIONS: None. ANESTHESIA/SEDATION: Versed  2 mg IV; Fentanyl  100 mcg IV; administered by the radiology nurse Moderate Sedation Time:  42 minutes The patient's vital signs and level of consciousness were continuously monitored during the procedure by the interventional radiology nurse under my direct supervision. FLUOROSCOPY: Radiation exposure index: 3 mGy reference air kerma COMPLICATIONS: None immediate. PROCEDURE: The right neck and chest was prepped with chlorhexidine , and draped in the usual sterile fashion using maximum barrier technique (cap and mask, sterile gown, sterile gloves,  large sterile sheet, hand hygiene and cutaneous antiseptic). Local anesthesia was attained by infiltration with 1% lidocaine  with epinephrine . Ultrasound demonstrated patency of the right internal jugular vein, and this was documented with an image. Under real-time ultrasound guidance, this vein was accessed with a 21 gauge micropuncture needle and image documentation was performed. A small dermatotomy was made at the access site with an 11 scalpel. A 0.018 wire was advanced into the SVC and the access needle exchanged for a 69F micropuncture vascular sheath. The 0.018 wire was then removed and a 0.035 wire advanced into the IVC. An appropriate location for the subcutaneous reservoir was selected below the clavicle and an incision was made through the skin and underlying soft tissues. The subcutaneous tissues were then dissected using a combination of blunt and sharp surgical technique and a pocket was formed. A Bard Clear Vue single lumen power injectable portacatheter was then tunneled through the subcutaneous tissues from the pocket to the dermatotomy  and the port reservoir placed within the subcutaneous pocket. The venous access site was then serially dilated and a peel away vascular sheath placed over the wire. The wire was removed and the port catheter advanced into position under fluoroscopic guidance. The catheter tip is positioned in the superior cavoatrial junction. This was documented with a spot image. The portacatheter was then tested and found to flush and aspirate well. The port was flushed with saline followed by 100 units/mL heparinized saline. The pocket was then closed in two layers using first subdermal inverted interrupted absorbable sutures followed by a running subcuticular suture. The epidermis was then sealed with Dermabond. The dermatotomy at the venous access site was also closed with Dermabond. The port catheter was left accessed and sterile bandages were applied. Next, the facial mass  was evaluated. The mass was interrogated with ultrasound. A suitable biopsy entry site was identified in the submental space allowing passage through relatively normal soft tissues in a region of relatively low vascularity within the mass. The skin was sterilely prepped and draped in the usual fashion using chlorhexidine  skin prep. Local anesthesia was attained by infiltration with 1% lidocaine . A small dermatotomy was made. Under real-time ultrasound guidance, multiple 18 gauge core biopsies were obtained using the Argon automated biopsy device. Biopsy specimens were placed on a moistened Telfa pad and submitted to pathology for further analysis. Post biopsy imaging demonstrates no evidence of active bleeding or other complication. IMPRESSION: Successful placement of a right IJ approach Bard Clear Vue port catheter with ultrasound and fluoroscopic guidance. The catheter is ready for use. Successful ultrasound-guided biopsy of right facial mass. Electronically Signed   By: Wilkie Lent M.D.   On: 10/15/2023 11:35   CT SOFT TISSUE NECK W CONTRAST Result Date: 10/14/2023 CLINICAL DATA:  89 year old male with a rapidly enlarging right face mass and history of follicular lymphoma. EXAM: CT NECK WITH CONTRAST TECHNIQUE: Multidetector CT imaging of the neck was performed using the standard protocol following the bolus administration of intravenous contrast. RADIATION DOSE REDUCTION: This exam was performed according to the departmental dose-optimization program which includes automated exposure control, adjustment of the mA and/or kV according to patient size and/or use of iterative reconstruction technique. CONTRAST:  75mL OMNIPAQUE  IOHEXOL  300 MG/ML  SOLN COMPARISON:  Face/neck soft tissue ultrasound 08/27/2023. PET-CT 08/25/2023. Neck CT 04/13/2020. FINDINGS: Pharynx and larynx: Larynx and pharynx soft tissue contours, mucosal enhancement remain within normal limits. Negative parapharyngeal and retropharyngeal  spaces. Salivary glands: Large, amorphous, relatively homogeneous trans spatial soft tissue mass about the right mandible body and ramus corresponding to similar appearing mass with intense FDG uptake on June PET-CT. The bulk of the mass is superficial to the mandible oil as in June. This mass with indistinct margins encompasses 95 x 44 by 124 mm (AP by transverse by CC) subcutaneous and dermis involvement. Some involvement of the right lower masticator space. Extension into the right submandibular space with mild mass effect on the right submandibular gland (series 2, image 62). Relatively spared left sublingual space. Left anterior inferior parotid space mildly affected. Contralateral left submandibular, masticator and parotid spaces are negative. Thyroid : Negative. Lymph nodes: Large superficial right face mass described above. Associated contiguous abnormal right level 1A lymph node in the midline which is 18 mm short axis on series 2, image 73. This is new or progressed since 08/25/2023. But little if any additional cervical lymphadenopathy, right level 2 nodes remain subcentimeter. Vascular: Both internal jugular veins in the neck appear partially effaced  but remain patent. Other major vascular structures in the bilateral neck and at the skull base are patent. Right greater than left carotid bifurcation atherosclerosis. Limited intracranial: Negative for age. Visualized orbits: Stable since 2022 and negative. Mastoids and visualized paranasal sinuses: Visualized paranasal sinuses and mastoids are stable and well aerated. Skeleton: Despite the bulky adjacent soft tissue mass the right mandible appears to remain intact and normally located. And no acute or suspicious osseous lesion is identified, with chronic cervical spine degeneration. Upper chest: Bilateral lung apex and subpleural mild lung scarring. No visible superior mediastinal or axillary lymphadenopathy. IMPRESSION: 1. Very large and trans-spatial up  to 12 cm long axis Lymphomatous Mass about the right mandible, corresponding to the large FDG avid tumor in June. Adjacent abnormal level 1A lymph node in the midline is new or progressed since June. 2. But no other lymphadenopathy in the Neck or at the thoracic inlet. And the underlying right mandible remains intact. Electronically Signed   By: VEAR Hurst M.D.   On: 10/14/2023 13:16     ADDENDUM  .Patient was Personally and independently interviewed, examined and relevant elements of the history of present illness were reviewed in details and an assessment and plan was created. All elements of the patient's history of present illness , assessment and plan were discussed in details with Bodee Lafoe NP. The above documentation reflects our combined findings assessment and plan.   I saw patient after chemotherapy. No immediate reaction or adverse effects. Had hematoma round his port and per ID port a cath was removed due to risk of infection. D2 and D3 PO Etoposide . Rituxan  as outpatient. Patient glad to have started treatment.  Gautam Kale MD MS

## 2023-10-23 NOTE — Procedures (Signed)
 Interventional Radiology Procedure Note  Procedure: Portacath removal  Findings: Please refer to procedural dictation for full description. Hematoma within port pocket, irrigated.  Complications: None immediate  Estimated Blood Loss: < 5 ml  Recommendations: Routine wound care.   Ester Sides, MD

## 2023-10-23 NOTE — Progress Notes (Signed)
 Progress Note   Patient: Harold [name] FMW:[id number] DOB: 12/19/1934 DOA: 10/18/2023     5 DOS: the patient was seen and examined on 10/23/2023   Brief hospital course: 89 y.o. 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   Patient recently admitted 7/28 - 7/31 for rapidly enlarging right neck mass concerning for high-grade lymphoma.  Seen by medical oncology and underwent IR biopsy and Port-A-Cath placement on 7/30 and subsequently discharged home on prednisone  with planned initiation of chemotherapy on 8/4.   In the ED, temperature 98.9 F, HR 110, RR 20, BP 83/48.  WBC 9.5, hemoglobin 11.7, platelet count 235.  Sodium 141, potassium 3.3, chloride 101, CO2 26, glucose 99, BUN 35, creatinine 0.77.  High sensitive troponin 18>48>421.  BNP 625.0.  Lactic acid 2.6.  Urinalysis unrevealing.  Influenza/RSV/COVID PCR negative.  Blood cultures x 2 and urine culture obtained.  Chest x-ray with no acute cardiopulmonary disease process.  Ultrasound bilateral lower extremities with no evidence of DVT RLE, findings of age-indeterminate DVT left posterior tibial veins and left peroneal veins.  Patient was aggressively resuscitated with IV fluids, started on vancomycin  and cefepime  and placed on a norepinephrine  drip, heparin  drip.  Cardiology was consulted.  PCCM was consulted and patient was admitted to the intensive care unit.   Significant hospital events: 8/2: admit by PCCM to ICU, Vanc/Cefepime , U/S LE with age indeterminate DVT LLE, heparin  gtt, Cards consulted, on Levophed  drip 8/3: Blood Cx 1 out 4 (aerobic) + Streptococcus per BCID, antibiotics changed to vancomycin  and cefazolin ; titrated off Levophed , TTE completed 8/4: Transfer to SDU under  TRH, antibiotics further de-escalated to penicillin , ID consulted, CT soft tissue neck w/o concern for hemorrhage within right neck mass  Assessment and Plan: Septic shock secondary to group B Streptococcus bacteremia Right lower extremity cellulitis Patient presenting to the ED with fever, weakness.  Recently diagnosed with presumed high-grade lymphoma in which she underwent recent Port-A-Cath placement and biopsy of right neck mass on 7/30.  Potential etiology for bacterial translocation.  On arrival to the ED patient was afebrile with temperature 98.9, and WBC count 9.5.  Elevated lactic acid 2.6.  Patient was aggressively resuscitated, but remained hypotensive and subsequently placed on vasopressor support.  Patient was admitted to the intensive care unit and started on IV vancomycin  and cefepime .  Blood cultures 1 out of 4 growing Streptococcus antibiotics were de-escalated to vancomycin  and cefazolin  and now to penicillin  alone.  TTE with LVEF 60 to 65%, moderate thickening of the aortic valve but no other valvular abnormalities identified.  Vasopressors were weaned off. -- Infectious disease following, appreciate assistance -- Blood Cx x 2 8/2: 1/4 (aerobic) + group B Streptococcus -- Repeat blood cultures 8/3: no growth x 2 days -- Urine culture: No growth -- Continue IV penicillin  -- Initially OK to use port, however hematoma noted this AM. IR to remove port   NSTEMI Patient presenting with chest pain, notably elevated troponin.  History of CAD with LHC 01/2023 60% ostial to prox RCA, 30% mid RCA, 40% prox LCx, 80% mid LAD proximal to prior LAD stent, 30% ISR of mid LAD stent , 50% dLAD and then occluded dLAD status post intracoronary lithotripsy of heavily calcified LAD with suboptimal expansion with balloon and  therefore stent was not placed.  TTE with LVEF 60-65%, no regional wall motion abnormalities, grade 2 diastolic dysfunction. -- Cardiology following, appreciate assistance -- Had  been on heparin  drip, now held secondary to hematoma at port site. Would resume when OK with IR   Low-grade follicular lymphoma Rapidly enlarging right neck mass Concern for high-grade lymphoma Patient recently admitted for rapidly enlarging right neck mass with concern for high-grade lymphoma.  CT neck with contrast 7/29: Very large and transfacial up to 12 cm long axis lymphomatous mass about the right mandible, corresponding to the large FDG avid tumor in June.  Adjacent abnormal level 1A lymph node in the midline is new or progressed since June. S/p IR core biopsies and Port-A-Cath placement on 7/30.  CT soft tissue neck 8/4 negative for hemorrhage within the right neck mass. -- Follows with Medical Oncology, Dr. [name] -- Pathology from lymph node biopsy 7/30: pos for diffuse large B-cell lymphoma -- Oncology to initiate chemo inpt. -Port removed per IR given hematoma this AM   Age indeterminant LLE DVT Vascular duplex ultrasound on admission notable for age-indeterminate DVT left posterior tibial veins and left peroneal veins.  Previously known history of DVT no longer on Xarelto. -- Heparin  on hold given hematoma around port, plan to resume anticoag when OK with IR   Hypokalemia Normalized   Essential hypertension Chronic diastolic congestive heart failure, compensated -- Atenolol  12.5 mg p.o. daily -- Furosemide  40 mg IV every 24 hours -- Continue monitor BP closely -- Strict I's and O's and daily weights - Lasix  held given soft bp per Cardiology -Will hold today's dose of atenolol  given soft bp   Hyperlipidemia -- Continue pravastatin  40 mg PO daily   CAD s/p remote stents Denies chest pain/angina.  No longer on Brilinta . -- Continue statin, atenolol    BPH -- Continue finasteride  5 mg p.o. daily   Anemia of chronic disease Hemoglobin stable      Subjective: Hematoma noted around port site today  Physical Exam: Vitals:   10/23/23 0900 10/23/23 1000 10/23/23 1100  10/23/23 1200  BP: (!) 106/47 (!) 109/49 (!) 101/54 (!) 103/48  Pulse: 98 93 98 80  Resp: 18 13 19 14   Temp:    97.9 F (36.6 C)  TempSrc:      SpO2: 95% 97% 96% 96%  Weight:      Height:       General exam: Conversant, in no acute distress Respiratory system: normal chest rise, clear, no audible wheezing Cardiovascular system: regular rhythm, s1-s2 Gastrointestinal system: Nondistended, nontender, pos BS Central nervous system: No seizures, no tremors Extremities: No cyanosis, no joint deformities Skin: No rashes, no pallor Psychiatry: Affect normal // no auditory hallucinations   Data Reviewed:  Labs reviewed: Na 136, K 3.6, Cr 0.75, WBC 10.2, Hgb 10.3, Plts 205  Family Communication: Pt in room, family not at bedside  Disposition: Status is: Inpatient Remains inpatient appropriate because: severity of illness  Planned Discharge Destination: Home    Author: Garnette Pelt, MD 10/23/2023 2:47 PM  For on call review [email or web address].

## 2023-10-23 NOTE — Plan of Care (Signed)

## 2023-10-23 NOTE — Progress Notes (Signed)
 Patient tolerated chemotherapy treatment without any issues. Peripheral IV remained intact and had brisk blood return before, during, and after all infusions.

## 2023-10-24 ENCOUNTER — Inpatient Hospital Stay

## 2023-10-24 DIAGNOSIS — C8290 Follicular lymphoma, unspecified, unspecified site: Secondary | ICD-10-CM

## 2023-10-24 DIAGNOSIS — C8308 Small cell B-cell lymphoma, lymph nodes of multiple sites: Secondary | ICD-10-CM | POA: Diagnosis not present

## 2023-10-24 DIAGNOSIS — C8208 Follicular lymphoma grade I, lymph nodes of multiple sites: Secondary | ICD-10-CM | POA: Diagnosis not present

## 2023-10-24 DIAGNOSIS — I82402 Acute embolism and thrombosis of unspecified deep veins of left lower extremity: Secondary | ICD-10-CM | POA: Diagnosis not present

## 2023-10-24 DIAGNOSIS — R6521 Severe sepsis with septic shock: Secondary | ICD-10-CM | POA: Diagnosis not present

## 2023-10-24 DIAGNOSIS — I5033 Acute on chronic diastolic (congestive) heart failure: Secondary | ICD-10-CM | POA: Diagnosis not present

## 2023-10-24 DIAGNOSIS — R7881 Bacteremia: Secondary | ICD-10-CM | POA: Diagnosis not present

## 2023-10-24 DIAGNOSIS — Z5111 Encounter for antineoplastic chemotherapy: Secondary | ICD-10-CM | POA: Diagnosis not present

## 2023-10-24 DIAGNOSIS — C8338 Diffuse large B-cell lymphoma, lymph nodes of multiple sites: Secondary | ICD-10-CM | POA: Diagnosis not present

## 2023-10-24 DIAGNOSIS — R220 Localized swelling, mass and lump, head: Secondary | ICD-10-CM | POA: Diagnosis not present

## 2023-10-24 DIAGNOSIS — A419 Sepsis, unspecified organism: Secondary | ICD-10-CM | POA: Diagnosis not present

## 2023-10-24 DIAGNOSIS — R509 Fever, unspecified: Secondary | ICD-10-CM | POA: Diagnosis not present

## 2023-10-24 DIAGNOSIS — I890 Lymphedema, not elsewhere classified: Secondary | ICD-10-CM | POA: Diagnosis not present

## 2023-10-24 LAB — COMPREHENSIVE METABOLIC PANEL WITH GFR
ALT: 9 U/L (ref 0–44)
AST: 15 U/L (ref 15–41)
Albumin: 2.3 g/dL — ABNORMAL LOW (ref 3.5–5.0)
Alkaline Phosphatase: 37 U/L — ABNORMAL LOW (ref 38–126)
Anion gap: 11 (ref 5–15)
BUN: 24 mg/dL — ABNORMAL HIGH (ref 8–23)
CO2: 24 mmol/L (ref 22–32)
Calcium: 8.5 mg/dL — ABNORMAL LOW (ref 8.9–10.3)
Chloride: 101 mmol/L (ref 98–111)
Creatinine, Ser: 0.75 mg/dL (ref 0.61–1.24)
GFR, Estimated: >60 mL/min (ref >60)
Glucose, Bld: 127 mg/dL — ABNORMAL HIGH (ref 70–99)
Potassium: 3.9 mmol/L (ref 3.5–5.1)
Sodium: 136 mmol/L (ref 135–145)
Total Bilirubin: 0.6 mg/dL (ref 0.0–1.2)
Total Protein: 5.2 g/dL — ABNORMAL LOW (ref 6.5–8.1)

## 2023-10-24 LAB — CULTURE, BLOOD (ROUTINE X 2)
Culture: NO GROWTH
Culture: NO GROWTH

## 2023-10-24 LAB — CBC
HCT: 28.7 % — ABNORMAL LOW (ref 39.0–52.0)
Hemoglobin: 9 g/dL — ABNORMAL LOW (ref 13.0–17.0)
MCH: 27.4 pg (ref 26.0–34.0)
MCHC: 31.4 g/dL (ref 30.0–36.0)
MCV: 87.5 fL (ref 80.0–100.0)
Platelets: 181 K/uL (ref 150–400)
RBC: 3.28 MIL/uL — ABNORMAL LOW (ref 4.22–5.81)
RDW: 14.7 % (ref 11.5–15.5)
WBC: 10.4 K/uL (ref 4.0–10.5)
nRBC: 0 % (ref 0.0–0.2)

## 2023-10-24 LAB — HEPARIN LEVEL (UNFRACTIONATED): Heparin Unfractionated: 0.34 [IU]/mL (ref 0.30–0.70)

## 2023-10-24 MED ORDER — ETOPOSIDE 50 MG PO CAPS
100.0000 mg | ORAL_CAPSULE | Freq: Every day | ORAL | Status: AC
  Administered 2023-10-24 – 2023-10-25 (×2): 100 mg via ORAL
  Filled 2023-10-24 (×4): qty 2

## 2023-10-24 NOTE — Progress Notes (Addendum)
 Harold [name]   DOB:Aug 25, 1934   FM#:[id number]      ASSESSMENT & PLAN:  Harold [name]  is an 89 year old male patient with oncologic history significant for follicular lymphoma.  Patient was discharged from inpatient on 10/16/23 and was scheduled to start chemotherapy 10/20/22.  Readmitted on 10/18/23 with complaints of chest pain, elevated troponin, bacteremia.       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  --LN biopsy of right axilla done 6/11, showed low grade FL. - Facial mass biopsy done 7/30.  Morphologic features consistent with DLBCL.  -- CT neck 7/29 shows very large up to 12 cm lymphomatous mass about the right mandible, no other lymphadenopathy in the neck or thoracic inlet.  --Port placed 7/30 for chemotherapy -->per ID okay to use however close monitoring for fever and chills. -Chemotherapy R-CEOP was scheduled outpatient 8/4.  Initiated chemotherapy regimen Day 1 CEOP on 8/7.  Continue days 2 and 3 etoposide  PO on 8/8 and 8/9. -- IV Rituximab  planned for outpatient administration next week. - Discussed with cardio, okay to proceed with treatment. - Medical oncology/Dr. [name] following closely.     Septic shock Bacteremia RLE cellulitis - Admitted on 10/18/2023 with fever and weakness. - Blood cultures positive for strep on 8/2.  Repeat 8/3 with NGTD x5 days. Neg for MRSA. Urine cx neg. Resp panel neg.   -- Continue IV antibiotics as ordered.   NSTEMI History of CAD - Patient with elevated troponin, decreasing - Denies chest pain since admission     - Cardiology following closely    Anemia, normocytic - Mild - Hemoglobin stable 9.0 -No transfusional intervention required at this time - Continue to monitor CBC with differential   History of melanoma - On chin - Status post resection    History of head and neck basal cell carcinoma - Continue close follow-up with Derm   History of DVT - LLE, age indeterminate -Patient reports being on Eliquis  on and off for several years - IV heparin  infusing, for transition to DOAC   New Hematoma -- site of port, removed 8/7, site remains slightly erythematous and edematous.  Continue to monitor closely as patient is on heparin .  -- IR following      Code Status DNR-Limited  Subjective:  Patient seen awake and alert sitting up in chair at bedside. States that  mass on his face feels less tight and that he can open his mouth better.  No acute distress is noted, no acute complaints offered.   Objective:   Intake/Output Summary (Last 24 hours) at 10/24/2023 1040 Last data filed at 10/24/2023 1020 Gross per 24 hour  Intake 1603.94 ml  Output 1025 ml  Net 578.94 ml     PHYSICAL EXAMINATION: ECOG PERFORMANCE STATUS: 2 - Symptomatic, <50% confined to bed  Vitals:   10/24/23 0824 10/24/23 1015  BP:  (!) 113/44  Pulse:  71  Resp:    Temp: 99 F (37.2 C)   SpO2:     Filed Weights   10/21/23 0500 10/23/23 0500 10/24/23 0500  Weight: 142 lb 10.2 oz (64.7 kg) 144 lb 13.5 oz (65.7 kg) 144 lb 10 oz (65.6 kg)    GENERAL: alert, no distress and comfortable +right-sided facial mass SKIN: +hematoma R ACW where POC removed - NO bleeding noted.  +RLE erythema and edema EYES: normal, conjunctiva are pink and non-injected,  sclera clear OROPHARYNX: no exudate, no erythema and lips, buccal mucosa, and tongue normal  NECK: supple, thyroid  normal size, non-tender, without nodularity LYMPH: no palpable lymphadenopathy in the cervical, axillary or inguinal LUNGS: clear to auscultation and percussion with normal breathing effort HEART: regular rate & rhythm and no murmurs and no lower extremity edema ABDOMEN: abdomen soft, non-tender and normal bowel sounds MUSCULOSKELETAL: no cyanosis of digits and no clubbing  PSYCH: alert & oriented x  3 with fluent speech NEURO: no focal motor/sensory deficits   All questions were answered. The patient knows to call the clinic with any problems, questions or concerns.   The total time spent in the appointment was 50 minutes encounter with patient including review of chart and various tests results, discussions about plan of care and coordination of care plan  [name], NP 10/24/2023 10:40 AM    Labs Reviewed:  Lab Results  Component Value Date   WBC 10.4 10/24/2023   HGB 9.0 (L) 10/24/2023   HCT 28.7 (L) 10/24/2023   MCV 87.5 10/24/2023   PLT 181 10/24/2023   Recent Labs    10/13/23 1212 10/16/23 0700 10/22/23 0504 10/23/23 0500 10/24/23 0818  NA 143   < > 133* 136 136  K 3.6   < > 3.6 3.6 3.9  CL 103   < > 97* 98 101  CO2 35*   < > 28 28 24   GLUCOSE 85   < > 90 103* 127*  BUN 22   < > 18 18 24*  CREATININE 1.00   < > 0.74 0.75 0.75  CALCIUM 8.9   < > 7.9* 8.3* 8.5*  GFRNONAA >60   < > >60 >60 >60  PROT 6.1*  --   --  5.2* 5.2*  ALBUMIN 3.6  --   --  2.6* 2.3*  AST 15  --   --  17 15  ALT 10  --   --  11 9  ALKPHOS 53  --   --  45 37*  BILITOT 0.5  --   --  0.6 0.6   < > = values in this interval not displayed.    Studies Reviewed:  IR REMOVAL TUN ACCESS W/ PORT W/O FL MOD SED Result Date: 10/23/2023 CLINICAL DATA:  89 year old male with hematoma and bacteremia after recent port placement. EXAM: REMOVAL OF IMPLANTED TUNNELED PORT-A-CATH MEDICATIONS: None. ANESTHESIA/SEDATION: None. FLUOROSCOPY TIME:  None PROCEDURE: Informed written consent was obtained from the patient after a discussion of the risk, benefits and alternatives to the procedure. The patient was positioned supine on the fluoroscopy table and the right chest Port-A-Cath site was prepped with chlorhexidine . A sterile gown and gloves were worn during the procedure. Local anesthesia was provided with 1% lidocaine  with epinephrine . A timeout was performed prior to the initiation of the procedure. An  incision was made overlying the Port-A-Cath with a #15 scalpel. Utilizing sharp and blunt dissection, the Port-A-Cath was removed completely. The pocket was irrigated with sterile saline. There was dark sanguinous material within the port pocket. Wound closure was performed with deep dermal interrupted 3-0 Vicryl and Dermabond. A dressing was placed. The patient tolerated the procedure well without immediate post procedural complication. FINDINGS: Successful removal of implant Port-A-Cath without immediate post procedural complication. IMPRESSION: Successful removal of implanted Port-A-Cath. Ester Sides, MD [facility] Electronically Signed   By: Ester Sides M.D.   On: 10/23/2023 15:30   CT SOFT TISSUE NECK W  CONTRAST Result Date: 10/20/2023 CLINICAL DATA:  Right neck mass EXAM: CT NECK WITH CONTRAST TECHNIQUE: Multidetector CT imaging of the neck was performed using the standard protocol following the bolus administration of intravenous contrast. RADIATION DOSE REDUCTION: This exam was performed according to the departmental dose-optimization program which includes automated exposure control, adjustment of the mA and/or kV according to patient size and/or use of iterative reconstruction technique. CONTRAST:  75mL OMNIPAQUE  IOHEXOL  300 MG/ML  SOLN COMPARISON:  10/14/2023 FINDINGS: Again noted is a large mass superficial to the mandible in the right-side of the neck. There is no hemorrhage. Pharynx: The nasopharynx, oropharynx and hypopharynx are normal Oral cavity/floor of mouth: Normal Larynx: Normal Salivary glands: The parotid and submandibular glands are normal Thyroid : Normal Lymph nodes: No adenopathy Vascular: No significant abnormality Limited intracranial: No significant abnormality Visualized orbits: No significant abnormality Mastoids and visualized paranasal sinuses: No significant abnormality Skeleton: No significant abnormality Upper  chest: No significant abnormality Other: None IMPRESSION: Approximally 11.6 by 4 x 11 cm mass in the right side of the face/neck unchanged from the prior study. No hemorrhage. Electronically Signed   By: Nancyann Burns M.D.   On: 10/20/2023 15:08   VAS US  LOWER EXTREMITY VENOUS (DVT) Result Date: 10/19/2023  Lower Venous DVT Study Patient Name:  NABOR THOMANN  Date of Exam:   10/18/2023 Medical Rec #: [id number]       Accession #:    [id number] Date of Birth: 1934/08/25       Patient Gender: M Patient Age:   26 years Exam Location:  [facility] Procedure:      VAS US  LOWER EXTREMITY VENOUS (DVT) Referring Phys: LAMAR CHRIS --------------------------------------------------------------------------------  Indications: Right > L Edema.  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 Chronic HFpEF. Limitations: Pitting edema, involuntary patient movement. Comparison Study: Prior bilateral LEV done 05/05/20 indicating chronic left                   peroneal DVT Performing Technologist: Alberta Lis RVS  Examination Guidelines: A complete evaluation includes B-mode imaging, spectral Doppler, color Doppler, and power Doppler as needed of all accessible portions of each vessel. Bilateral testing is considered an integral part of a complete examination. Limited examinations for reoccurring indications may be performed as noted. The reflux portion of the exam is performed with the patient in reverse Trendelenburg.  +---------+---------------+---------+-----------+----------+--------------+ RIGHT    CompressibilityPhasicitySpontaneityPropertiesThrombus Aging +---------+---------------+---------+-----------+----------+--------------+ CFV      Full           Yes      No                                  +---------+---------------+---------+-----------+----------+--------------+ SFJ      Full                                                         +---------+---------------+---------+-----------+----------+--------------+ FV Prox  Full           Yes      No                                  +---------+---------------+---------+-----------+----------+--------------+  FV Mid   Full                                                        +---------+---------------+---------+-----------+----------+--------------+ FV DistalFull                                                        +---------+---------------+---------+-----------+----------+--------------+ PFV      Full           Yes      No                                  +---------+---------------+---------+-----------+----------+--------------+ POP      Full           Yes      No                                  +---------+---------------+---------+-----------+----------+--------------+ PTV      Full                                                        +---------+---------------+---------+-----------+----------+--------------+ PERO     Full                                                        +---------+---------------+---------+-----------+----------+--------------+   +---------+---------------+---------+-----------+----------+-------------------+ LEFT     CompressibilityPhasicitySpontaneityPropertiesThrombus Aging      +---------+---------------+---------+-----------+----------+-------------------+ CFV      Full           Yes      No                                       +---------+---------------+---------+-----------+----------+-------------------+ SFJ      Full                                                             +---------+---------------+---------+-----------+----------+-------------------+ FV Prox  Full           Yes      No                                       +---------+---------------+---------+-----------+----------+-------------------+ FV Mid   Full                                                              +---------+---------------+---------+-----------+----------+-------------------+  FV DistalFull           Yes      No                                       +---------+---------------+---------+-----------+----------+-------------------+ PFV      Full                                                             +---------+---------------+---------+-----------+----------+-------------------+ POP                     Yes      No                   patent by color and                                                       Doppler             +---------+---------------+---------+-----------+----------+-------------------+ PTV      None                                         Age Indeterminate   +---------+---------------+---------+-----------+----------+-------------------+ PERO     None                                         Age Indeterminate   +---------+---------------+---------+-----------+----------+-------------------+     Summary: RIGHT: - There is no evidence of deep vein thrombosis in the lower extremity.  Interstitial edema noted throughout the extremity  LEFT: - Findings consistent with age indeterminate deep vein thrombosis involving the left posterior tibial veins, and left peroneal veins.  Interstitial edema noted throughout the extremity.  *See table(s) above for measurements and observations. Electronically signed by Debby Robertson on 10/19/2023 at 3:36:23 PM.    Final    ECHOCARDIOGRAM COMPLETE Result Date: 10/19/2023    ECHOCARDIOGRAM REPORT   Patient Name:   KAIYDEN SIMKIN Date of Exam: 10/19/2023 Medical Rec #:  [id number]      Height:       66.0 in Accession #:    [id number]     Weight:       153.9 lb Date of Birth:  Apr 15, 1934      BSA:          1.789 m Patient Age:    88 years       BP:           111/48 mmHg Patient Gender: M              HR:           69 bpm. Exam Location:  Inpatient Procedure: 2D Echo, Cardiac Doppler, Color Doppler, Strain  Analysis and 3D Echo            (Both Spectral and Color Flow Doppler were utilized  during            procedure). Indications:    Shock  History:        Patient has prior history of Echocardiogram examinations, most                 recent 10/15/2023. CAD and Previous Myocardial Infarction, Mitral                 Valve Disease and Aortic Valve Disease; Risk                 Factors:Hypertension and Dyslipidemia.  Sonographer:    Therisa Crouch Referring Phys: 5 ROBERT S BYRUM IMPRESSIONS  1. No new RWMA;s since TTE done 10/15/23. Left ventricular ejection fraction, by estimation, is 60 to 65%. The left ventricle has normal function. The left ventricle has no regional wall motion abnormalities. Left ventricular diastolic parameters are consistent with Grade II diastolic dysfunction (pseudonormalization). Elevated left ventricular end-diastolic pressure. The average left ventricular global longitudinal strain is -19.9 %. The global longitudinal strain is normal.  2. Right ventricular systolic function is normal. The right ventricular size is normal.  3. Right atrial size was mildly dilated.  4. The mitral valve is degenerative. Trivial mitral valve regurgitation. Mild mitral stenosis. Moderate mitral annular calcification.  5. Tricuspid valve regurgitation is moderate.  6. The aortic valve is tricuspid. There is moderate calcification of the aortic valve. There is moderate thickening of the aortic valve. Aortic valve regurgitation is not visualized. Mild to moderate aortic valve stenosis.  7. The inferior vena cava is normal in size with greater than 50% respiratory variability, suggesting right atrial pressure of 3 mmHg. FINDINGS  Left Ventricle: No new RWMA;s since TTE done 10/15/23. Left ventricular ejection fraction, by estimation, is 60 to 65%. The left ventricle has normal function. The left ventricle has no regional wall motion abnormalities. The average left ventricular global longitudinal strain is -19.9 %.  Strain was performed and the global longitudinal strain is normal. The left ventricular internal cavity size was normal in size. There is no left ventricular hypertrophy. Left ventricular diastolic parameters are consistent with Grade II diastolic dysfunction (pseudonormalization). Elevated left ventricular end-diastolic pressure. Right Ventricle: The right ventricular size is normal. No increase in right ventricular wall thickness. Right ventricular systolic function is normal. Left Atrium: Left atrial size was normal in size. Right Atrium: Right atrial size was mildly dilated. Pericardium: There is no evidence of pericardial effusion. Mitral Valve: The mitral valve is degenerative in appearance. Moderate mitral annular calcification. Trivial mitral valve regurgitation. Mild mitral valve stenosis. MV peak gradient, 8.9 mmHg. The mean mitral valve gradient is 5.0 mmHg. Tricuspid Valve: The tricuspid valve is normal in structure. Tricuspid valve regurgitation is moderate . No evidence of tricuspid stenosis. Aortic Valve: The aortic valve is tricuspid. There is moderate calcification of the aortic valve. There is moderate thickening of the aortic valve. Aortic valve regurgitation is not visualized. Mild to moderate aortic stenosis is present. Aortic valve mean gradient measures 11.7 mmHg. Aortic valve peak gradient measures 20.4 mmHg. Aortic valve area, by VTI measures 1.32 cm. Pulmonic Valve: The pulmonic valve was normal in structure. Pulmonic valve regurgitation is trivial. No evidence of pulmonic stenosis. Aorta: The aortic root is normal in size and structure. Venous: The inferior vena cava is normal in size with greater than 50% respiratory variability, suggesting right atrial pressure of 3 mmHg. IAS/Shunts: No atrial level shunt detected by color flow Doppler. Additional Comments: 3D was  performed not requiring image post processing on an independent workstation and was indeterminate.  LEFT VENTRICLE PLAX 2D  LVIDd:         3.80 cm   Diastology LVIDs:         2.50 cm   LV e' medial:    6.64 cm/s LV PW:         0.90 cm   LV E/e' medial:  17.3 LV IVS:        0.90 cm   LV e' lateral:   7.83 cm/s LVOT diam:     2.00 cm   LV E/e' lateral: 14.7 LV SV:         66 LV SV Index:   37        2D Longitudinal Strain LVOT Area:     3.14 cm  2D Strain GLS Avg:     -19.9 %                           3D Volume EF                          LV 3D EDV:   103.42 ml                          LV 3D ESV:   39.24 ml RIGHT VENTRICLE             IVC RV Basal diam:  3.60 cm     IVC diam: 3.00 cm RV S prime:     13.30 cm/s TAPSE (M-mode): 2.4 cm LEFT ATRIUM             Index        RIGHT ATRIUM           Index LA diam:        3.60 cm 2.01 cm/m   RA Area:     20.60 cm LA Vol (A2C):   77.6 ml 43.38 ml/m  RA Volume:   55.40 ml  30.97 ml/m LA Vol (A4C):   75.6 ml 42.26 ml/m LA Biplane Vol: 77.3 ml 43.21 ml/m  AORTIC VALVE AV Area (Vmax):    1.27 cm AV Area (Vmean):   1.23 cm AV Area (VTI):     1.32 cm AV Vmax:           226.00 cm/s AV Vmean:          158.333 cm/s AV VTI:            0.505 m AV Peak Grad:      20.4 mmHg AV Mean Grad:      11.7 mmHg LVOT Vmax:         91.05 cm/s LVOT Vmean:        62.100 cm/s LVOT VTI:          0.212 m LVOT/AV VTI ratio: 0.42  AORTA Ao Root diam: 3.50 cm Ao Asc diam:  3.20 cm MITRAL VALVE                TRICUSPID VALVE MV Area (PHT): 3.17 cm     TR Peak grad:   34.8 mmHg MV Area VTI:   1.32 cm     TR Vmax:        295.00 cm/s MV Peak grad:  8.9 mmHg MV Mean grad:  5.0 mmHg     SHUNTS MV Vmax:  1.49 m/s     Systemic VTI:  0.21 m MV Vmean:      106.5 cm/s   Systemic Diam: 2.00 cm MV Decel Time: 239 msec MV E velocity: 115.00 cm/s MV A velocity: 130.00 cm/s MV E/A ratio:  0.88 Maude Emmer MD Electronically signed by Maude Emmer MD Signature Date/Time: 10/19/2023/10:56:55 AM    Final    DG Chest Port 1 View Result Date: 10/18/2023 EXAM: 1 VIEW XRAY OF THE CHEST 10/18/2023 02:13:00 AM COMPARISON: 01/01/2011  CLINICAL HISTORY: 355200 Chest pain [id number]. Per chart: Patient BIB EMS for chest pain and fever that started last night. Patient was seen here earlier this week and had port implanted for chemotherapy related to Non-Hodgkin's lymphoma. Patient given 324 ASA and 1 inch nitropaste from EMS. Denies any pain at this time. FINDINGS: LUNGS AND PLEURA: No focal pulmonary opacity. No pulmonary edema. No pleural effusion. No pneumothorax. HEART AND MEDIASTINUM: No acute abnormality of the cardiac and mediastinal silhouettes. BONES AND SOFT TISSUES: No acute osseous abnormality. Right chest wall port-a-cath with tip in the lower SVC. IMPRESSION: 1. No acute process. Electronically signed by: Franky Stanford MD 10/18/2023 02:24 AM EDT RP Workstation: [id number]   ECHOCARDIOGRAM COMPLETE Result Date: 10/15/2023    ECHOCARDIOGRAM REPORT   Patient Name:   Hartwell WENZLICK Date of Exam: 10/15/2023 Medical Rec #:  [id number]      Height:       66.0 in Accession #:    [id number]     Weight:       155.3 lb Date of Birth:  Nov 05, 1934      BSA:          1.796 m Patient Age:    88 years       BP:           95/50 mmHg Patient Gender: M              HR:           68 bpm. Exam Location:  Inpatient Procedure: 2D Echo, 3D Echo, Strain Analysis, Cardiac Doppler and Color Doppler            (Both Spectral and Color Flow Doppler were utilized during            procedure). Indications:    CAD native vessel I25.10  History:        Patient has prior history of Echocardiogram examinations, most                 recent 01/29/2023. CAD and Previous Myocardial Infarction,                 Mitral Valve Disease; Risk Factors:Hypertension and                 Dyslipidemia.  Sonographer:    Koleen Popper RDCS Referring Phys: [phone] ANAND D HONGALGI  Sonographer Comments: Global longitudinal strain was attempted. IMPRESSIONS  1. Left ventricular ejection fraction, by estimation, is 60 to 65%. Left ventricular ejection fraction by 3D volume is 62 %. The left  ventricle has normal function. The left ventricle has no regional wall motion abnormalities. Left ventricular diastolic  parameters are consistent with Grade II diastolic dysfunction (pseudonormalization). The average left ventricular global longitudinal strain is -19.9 %. The global longitudinal strain is normal.  2. Right ventricular systolic function is mildly reduced. The right ventricular size is mildly enlarged. There is mildly elevated pulmonary artery systolic pressure. The estimated right ventricular systolic pressure is  41.6 mmHg.  3. The mitral valve is normal in structure. Mild mitral valve regurgitation. Mild mitral stenosis. Severe mitral annular calcification.  4. The tricuspid valve is degenerative. Tricuspid valve regurgitation is moderate.  5. The aortic valve has an indeterminant number of cusps. There is severe calcifcation of the aortic valve. Aortic valve regurgitation is mild. Mild aortic valve stenosis. Aortic valve Vmax measures 2.17 m/s.  6. The inferior vena cava is dilated in size with <50% respiratory variability, suggesting right atrial pressure of 15 mmHg. FINDINGS  Left Ventricle: Left ventricular ejection fraction, by estimation, is 60 to 65%. Left ventricular ejection fraction by 3D volume is 62 %. The left ventricle has normal function. The left ventricle has no regional wall motion abnormalities. The average left ventricular global longitudinal strain is -19.9 %. Strain was performed and the global longitudinal strain is normal. The left ventricular internal cavity size was normal in size. There is no left ventricular hypertrophy. Left ventricular diastolic parameters are consistent with Grade II diastolic dysfunction (pseudonormalization). Right Ventricle: The right ventricular size is mildly enlarged. No increase in right ventricular wall thickness. Right ventricular systolic function is mildly reduced. There is mildly elevated pulmonary artery systolic pressure. The tricuspid  regurgitant  velocity is 2.58 m/s, and with an assumed right atrial pressure of 15 mmHg, the estimated right ventricular systolic pressure is 41.6 mmHg. Left Atrium: Left atrial size was normal in size. Right Atrium: Right atrial size was normal in size. Pericardium: There is no evidence of pericardial effusion. Mitral Valve: The mitral valve is normal in structure. Severe mitral annular calcification. Mild mitral valve regurgitation. Mild mitral valve stenosis. MV peak gradient, 12.7 mmHg. The mean mitral valve gradient is 4.0 mmHg. Tricuspid Valve: The tricuspid valve is degenerative in appearance. Tricuspid valve regurgitation is moderate . No evidence of tricuspid stenosis. Aortic Valve: The aortic valve has an indeterminant number of cusps. There is severe calcifcation of the aortic valve. Aortic valve regurgitation is mild. Mild aortic stenosis is present. Aortic valve mean gradient measures 11.0 mmHg. Aortic valve peak gradient measures 18.8 mmHg. Aortic valve area, by VTI measures 1.07 cm. Pulmonic Valve: The pulmonic valve was grossly normal. Pulmonic valve regurgitation is mild. No evidence of pulmonic stenosis. Aorta: The aortic root is normal in size and structure. Venous: The inferior vena cava is dilated in size with less than 50% respiratory variability, suggesting right atrial pressure of 15 mmHg. IAS/Shunts: No atrial level shunt detected by color flow Doppler. Additional Comments: 3D was performed not requiring image post processing on an independent workstation and was normal.  LEFT VENTRICLE PLAX 2D LVIDd:         3.60 cm         Diastology LVIDs:         2.30 cm         LV e' medial:    6.20 cm/s LV PW:         1.00 cm         LV E/e' medial:  18.9 LV IVS:        1.00 cm         LV e' lateral:   5.77 cm/s LVOT diam:     1.50 cm         LV E/e' lateral: 20.3 LV SV:         46 LV SV Index:   26              2D Longitudinal LVOT Area:  1.77 cm        Strain                                2D  Strain GLS   -19.9 %                                Avg:                                 3D Volume EF                                LV 3D EF:    Left                                             ventricul                                             ar                                             ejection                                             fraction                                             by 3D                                             volume is                                             62 %.                                 3D Volume EF:                                3D EF:        62 %                                LV EDV:       93 ml  LV ESV:       35 ml                                LV SV:        58 ml RIGHT VENTRICLE             IVC RV Basal diam:  4.30 cm     IVC diam: 2.40 cm RV Mid diam:    3.80 cm RV S prime:     14.90 cm/s TAPSE (M-mode): 2.0 cm LEFT ATRIUM             Index        RIGHT ATRIUM           Index LA diam:        3.30 cm 1.84 cm/m   RA Area:     12.80 cm LA Vol (A2C):   37.9 ml 21.10 ml/m  RA Volume:   29.90 ml  16.65 ml/m LA Vol (A4C):   35.3 ml 19.65 ml/m LA Biplane Vol: 39.5 ml 21.99 ml/m  AORTIC VALVE AV Area (Vmax):    1.07 cm AV Area (Vmean):   0.94 cm AV Area (VTI):     1.07 cm AV Vmax:           217.00 cm/s AV Vmean:          157.000 cm/s AV VTI:            0.432 m AV Peak Grad:      18.8 mmHg AV Mean Grad:      11.0 mmHg LVOT Vmax:         131.00 cm/s LVOT Vmean:        83.200 cm/s LVOT VTI:          0.261 m LVOT/AV VTI ratio: 0.60  AORTA Ao Root diam: 2.70 cm Ao Asc diam:  3.30 cm MITRAL VALVE                TRICUSPID VALVE MV Area (PHT): 2.16 cm     TR Peak grad:   26.6 mmHg MV Area VTI:   1.02 cm     TR Vmax:        258.00 cm/s MV Peak grad:  12.7 mmHg MV Mean grad:  4.0 mmHg     SHUNTS MV Vmax:       1.78 m/s     Systemic VTI:  0.26 m MV Vmean:      95.9 cm/s    Systemic Diam: 1.50 cm MV Decel Time: 351 msec MV E velocity:  117.00 cm/s MV A velocity: 149.00 cm/s MV E/A ratio:  0.79 Morene Brownie Electronically signed by Morene Brownie Signature Date/Time: 10/15/2023/6:24:54 PM    Final    IR IMAGING GUIDED PORT INSERTION Result Date: 10/15/2023 INDICATION: 89 year old male with a known history of low-grade follicular lymphoma with enlarging face all mass concerning for transformation to high-grade lymphoma. He presents for port catheter placement to establish durable venous access in preparation for chemotherapy as well as ultrasound-guided core biopsy of the facial mass. EXAM: IMPLANTED PORT A CATH PLACEMENT WITH ULTRASOUND AND FLUOROSCOPIC GUIDANCE ULTRASOUND-GUIDED CORE BIOPSY MEDICATIONS: None. ANESTHESIA/SEDATION: Versed  2 mg IV; Fentanyl  100 mcg IV; administered by the radiology nurse Moderate Sedation Time:  42 minutes The patient's vital signs and level of consciousness were continuously monitored during the procedure by the interventional radiology nurse under my direct supervision. FLUOROSCOPY:  Radiation exposure index: 3 mGy reference air kerma COMPLICATIONS: None immediate. PROCEDURE: The right neck and chest was prepped with chlorhexidine , and draped in the usual sterile fashion using maximum barrier technique (cap and mask, sterile gown, sterile gloves, large sterile sheet, hand hygiene and cutaneous antiseptic). Local anesthesia was attained by infiltration with 1% lidocaine  with epinephrine . Ultrasound demonstrated patency of the right internal jugular vein, and this was documented with an image. Under real-time ultrasound guidance, this vein was accessed with a 21 gauge micropuncture needle and image documentation was performed. A small dermatotomy was made at the access site with an 11 scalpel. A 0.018 wire was advanced into the SVC and the access needle exchanged for a 69F micropuncture vascular sheath. The 0.018 wire was then removed and a 0.035 wire advanced into the IVC. An appropriate location for the  subcutaneous reservoir was selected below the clavicle and an incision was made through the skin and underlying soft tissues. The subcutaneous tissues were then dissected using a combination of blunt and sharp surgical technique and a pocket was formed. A Bard Clear Vue single lumen power injectable portacatheter was then tunneled through the subcutaneous tissues from the pocket to the dermatotomy and the port reservoir placed within the subcutaneous pocket. The venous access site was then serially dilated and a peel away vascular sheath placed over the wire. The wire was removed and the port catheter advanced into position under fluoroscopic guidance. The catheter tip is positioned in the superior cavoatrial junction. This was documented with a spot image. The portacatheter was then tested and found to flush and aspirate well. The port was flushed with saline followed by 100 units/mL heparinized saline. The pocket was then closed in two layers using first subdermal inverted interrupted absorbable sutures followed by a running subcuticular suture. The epidermis was then sealed with Dermabond. The dermatotomy at the venous access site was also closed with Dermabond. The port catheter was left accessed and sterile bandages were applied. Next, the facial mass was evaluated. The mass was interrogated with ultrasound. A suitable biopsy entry site was identified in the submental space allowing passage through relatively normal soft tissues in a region of relatively low vascularity within the mass. The skin was sterilely prepped and draped in the usual fashion using chlorhexidine  skin prep. Local anesthesia was attained by infiltration with 1% lidocaine . A small dermatotomy was made. Under real-time ultrasound guidance, multiple 18 gauge core biopsies were obtained using the Argon automated biopsy device. Biopsy specimens were placed on a moistened Telfa pad and submitted to pathology for further analysis. Post biopsy  imaging demonstrates no evidence of active bleeding or other complication. IMPRESSION: Successful placement of a right IJ approach Bard Clear Vue port catheter with ultrasound and fluoroscopic guidance. The catheter is ready for use. Successful ultrasound-guided biopsy of right facial mass. Electronically Signed   By: Wilkie Lent M.D.   On: 10/15/2023 11:35   IR US  Guide Bx Asp/Drain Result Date: 10/15/2023 INDICATION: 89 year old male with a known history of low-grade follicular lymphoma with enlarging face all mass concerning for transformation to high-grade lymphoma. He presents for port catheter placement to establish durable venous access in preparation for chemotherapy as well as ultrasound-guided core biopsy of the facial mass. EXAM: IMPLANTED PORT A CATH PLACEMENT WITH ULTRASOUND AND FLUOROSCOPIC GUIDANCE ULTRASOUND-GUIDED CORE BIOPSY MEDICATIONS: None. ANESTHESIA/SEDATION: Versed  2 mg IV; Fentanyl  100 mcg IV; administered by the radiology nurse Moderate Sedation Time:  42 minutes The patient's vital signs and level of consciousness  were continuously monitored during the procedure by the interventional radiology nurse under my direct supervision. FLUOROSCOPY: Radiation exposure index: 3 mGy reference air kerma COMPLICATIONS: None immediate. PROCEDURE: The right neck and chest was prepped with chlorhexidine , and draped in the usual sterile fashion using maximum barrier technique (cap and mask, sterile gown, sterile gloves, large sterile sheet, hand hygiene and cutaneous antiseptic). Local anesthesia was attained by infiltration with 1% lidocaine  with epinephrine . Ultrasound demonstrated patency of the right internal jugular vein, and this was documented with an image. Under real-time ultrasound guidance, this vein was accessed with a 21 gauge micropuncture needle and image documentation was performed. A small dermatotomy was made at the access site with an 11 scalpel. A 0.018 wire was advanced into  the SVC and the access needle exchanged for a 71F micropuncture vascular sheath. The 0.018 wire was then removed and a 0.035 wire advanced into the IVC. An appropriate location for the subcutaneous reservoir was selected below the clavicle and an incision was made through the skin and underlying soft tissues. The subcutaneous tissues were then dissected using a combination of blunt and sharp surgical technique and a pocket was formed. A Bard Clear Vue single lumen power injectable portacatheter was then tunneled through the subcutaneous tissues from the pocket to the dermatotomy and the port reservoir placed within the subcutaneous pocket. The venous access site was then serially dilated and a peel away vascular sheath placed over the wire. The wire was removed and the port catheter advanced into position under fluoroscopic guidance. The catheter tip is positioned in the superior cavoatrial junction. This was documented with a spot image. The portacatheter was then tested and found to flush and aspirate well. The port was flushed with saline followed by 100 units/mL heparinized saline. The pocket was then closed in two layers using first subdermal inverted interrupted absorbable sutures followed by a running subcuticular suture. The epidermis was then sealed with Dermabond. The dermatotomy at the venous access site was also closed with Dermabond. The port catheter was left accessed and sterile bandages were applied. Next, the facial mass was evaluated. The mass was interrogated with ultrasound. A suitable biopsy entry site was identified in the submental space allowing passage through relatively normal soft tissues in a region of relatively low vascularity within the mass. The skin was sterilely prepped and draped in the usual fashion using chlorhexidine  skin prep. Local anesthesia was attained by infiltration with 1% lidocaine . A small dermatotomy was made. Under real-time ultrasound guidance, multiple 18 gauge  core biopsies were obtained using the Argon automated biopsy device. Biopsy specimens were placed on a moistened Telfa pad and submitted to pathology for further analysis. Post biopsy imaging demonstrates no evidence of active bleeding or other complication. IMPRESSION: Successful placement of a right IJ approach Bard Clear Vue port catheter with ultrasound and fluoroscopic guidance. The catheter is ready for use. Successful ultrasound-guided biopsy of right facial mass. Electronically Signed   By: Wilkie Lent M.D.   On: 10/15/2023 11:35   CT SOFT TISSUE NECK W CONTRAST Result Date: 10/14/2023 CLINICAL DATA:  89 year old male with a rapidly enlarging right face mass and history of follicular lymphoma. EXAM: CT NECK WITH CONTRAST TECHNIQUE: Multidetector CT imaging of the neck was performed using the standard protocol following the bolus administration of intravenous contrast. RADIATION DOSE REDUCTION: This exam was performed according to the departmental dose-optimization program which includes automated exposure control, adjustment of the mA and/or kV according to patient size and/or use of iterative  reconstruction technique. CONTRAST:  75mL OMNIPAQUE  IOHEXOL  300 MG/ML  SOLN COMPARISON:  Face/neck soft tissue ultrasound 08/27/2023. PET-CT 08/25/2023. Neck CT 04/13/2020. FINDINGS: Pharynx and larynx: Larynx and pharynx soft tissue contours, mucosal enhancement remain within normal limits. Negative parapharyngeal and retropharyngeal spaces. Salivary glands: Large, amorphous, relatively homogeneous trans spatial soft tissue mass about the right mandible body and ramus corresponding to similar appearing mass with intense FDG uptake on June PET-CT. The bulk of the mass is superficial to the mandible oil as in June. This mass with indistinct margins encompasses 95 x 44 by 124 mm (AP by transverse by CC) subcutaneous and dermis involvement. Some involvement of the right lower masticator space. Extension into the  right submandibular space with mild mass effect on the right submandibular gland (series 2, image 62). Relatively spared left sublingual space. Left anterior inferior parotid space mildly affected. Contralateral left submandibular, masticator and parotid spaces are negative. Thyroid : Negative. Lymph nodes: Large superficial right face mass described above. Associated contiguous abnormal right level 1A lymph node in the midline which is 18 mm short axis on series 2, image 73. This is new or progressed since 08/25/2023. But little if any additional cervical lymphadenopathy, right level 2 nodes remain subcentimeter. Vascular: Both internal jugular veins in the neck appear partially effaced but remain patent. Other major vascular structures in the bilateral neck and at the skull base are patent. Right greater than left carotid bifurcation atherosclerosis. Limited intracranial: Negative for age. Visualized orbits: Stable since 2022 and negative. Mastoids and visualized paranasal sinuses: Visualized paranasal sinuses and mastoids are stable and well aerated. Skeleton: Despite the bulky adjacent soft tissue mass the right mandible appears to remain intact and normally located. And no acute or suspicious osseous lesion is identified, with chronic cervical spine degeneration. Upper chest: Bilateral lung apex and subpleural mild lung scarring. No visible superior mediastinal or axillary lymphadenopathy. IMPRESSION: 1. 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 Adjacent abnormal level 1A lymph node in the midline is new or progressed since June. 2. But no other lymphadenopathy in the Neck or at the thoracic inlet. And the underlying right mandible remains intact. Electronically Signed   By: VEAR Hurst M.D.   On: 10/14/2023 13:16     ADDENDUM  .Patient was Personally and independently interviewed, examined and relevant  elements of the history of present illness were reviewed in details and an assessment and plan was created. All elements of the patient's history of present illness , assessment and plan were discussed in details with **  Olam Brunner NP. The above documentation reflects our combined findings assessment and plan.   -patient notes less rt facial swelling already and better ability to open his mouth to eat more comfrtably. -Notes he hasn't moved around and would liek to try to walk with PT prior to discharge to ensure he is able to function at home by himself. -no nausea or there intolerance to C1D1 and D2 of treatment. Plan -will get po Etoposide  per order on C1D3 on 8/9 -PT evaluation to determine home discharge needs. -will need to start G-CSF from D4 -- granix  daily from 8/10 PM while hospitalized. Neulasta  as outpatient on discharge alongwith his outpatient Rituxan . -Okay to discharge from oncology standpoint.Date of discharge TBD by hospital medicine based on PT input, ID and cardiology plans. -we shall setup outpatient followup with oncology next week.  Emaline Saran MD  MS

## 2023-10-24 NOTE — Progress Notes (Signed)
  Progress Note  Patient Name: Harold [name] Date of Encounter: 10/24/2023 [facility] Cardiologist: Lonni Cash, MD   Interval Summary   No acute cardiac complaints Only issue is with his port that had to come out  Otherwise doing okay BP remains on the softer side   Vital Signs Vitals:   10/24/23 0400 10/24/23 0500 10/24/23 0600 10/24/23 0824  BP: (!) 108/49 (!) 116/43 (!) 119/40   Pulse: 67 65 66   Resp: 13 13 11    Temp:    99 F (37.2 C)  TempSrc:    Oral  SpO2: 98% 95% 99%   Weight:  65.6 kg    Height:        Intake/Output Summary (Last 24 hours) at 10/24/2023 0931 Last data filed at 10/24/2023 0437 Gross per 24 hour  Intake 1604.62 ml  Output 1050 ml  Net 554.62 ml      10/24/2023    5:00 AM 10/23/2023    5:00 AM 10/21/2023    5:00 AM  Last 3 Weights  Weight (lbs) 144 lb 10 oz 144 lb 13.5 oz 142 lb 10.2 oz  Weight (kg) 65.6 kg 65.7 kg 64.7 kg     Telemetry/ECG  Sinus rhythm, HR 80s - Personally Reviewed  Physical Exam  GEN: No acute distress.   Neck: large asymmetric right facial swelling  Cardiac: RRR, no murmurs, rubs, or gallops.  Respiratory: Clear to auscultation bilaterally. GI: Soft, nontender, non-distended  MS: mild edema  Assessment & Plan  History of CAD  NSTEMI  Presented with chest pain  Troponin 18 > 48 > 421 > 610 > 735 > 575 > 539 Suspect demand ischemic in the setting of sepsis, hypotension, tachycardia  No recurrent chest pain, shortness of breath LHC from 01/2023 showed: severe mid LAD stenosis s/p lithotripsy, mild LCX stenosis, mild-moderate RCA stenosis  There are no plans for invasive cardiac evaluation  Continue ASA 81 mg daily Continue atenolol  12.5 mg daily Continue pravastatin  40 mg daily    Chronic HFpEF Echo this admission: EF 60-65%, G2DD, normal RV function, mild MS, moderate TR, mild to moderate AS, normal IVC Given IV fluids for septic shock  BNP 625 > 165 Creatinine stable, pending AM labs   Continue strict I&O's, daily weights, daily BMPs Continue to hold off on additional Lasix  today, monitor and reassess need  Continue atenolol  12.5 mg daily   LLE 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

## 2023-10-24 NOTE — Plan of Care (Signed)
  Problem: Education: Goal: Knowledge of General Education information will improve Description: Including pain rating scale, medication(s)/side effects and non-pharmacologic comfort measures Outcome: Progressing   Problem: Health Behavior/Discharge Planning: Goal: Ability to manage health-related needs will improve Outcome: Progressing   Problem: Activity: Goal: Risk for activity intolerance will decrease Outcome: Progressing   Problem: Nutrition: Goal: Adequate nutrition will be maintained Outcome: Progressing   Problem: Coping: Goal: Level of anxiety will decrease Outcome: Progressing   Problem: Elimination: Goal: Will not experience complications related to bowel motility Outcome: Progressing   Problem: Respiratory: Goal: Ability to maintain adequate ventilation will improve Outcome: Progressing

## 2023-10-24 NOTE — Progress Notes (Signed)
 PHARMACY - ANTICOAGULATION CONSULT NOTE  Pharmacy Consult for Heparin  Indication: DVT  No Known Allergies  Patient Measurements: Height: 5' 6 (167.6 cm) Weight: 65.6 kg (144 lb 10 oz) IBW/kg (Calculated) : 63.8 HEPARIN  DW (KG): 64  Vital Signs: Temp: 99 F (37.2 C) (08/08 0824) Temp Source: Oral (08/08 0824) BP: 119/40 (08/08 0600) Pulse Rate: 66 (08/08 0600)  Labs: Recent Labs    10/22/23 0504 10/23/23 0500 10/24/23 0818  HGB 9.9* 10.3* 9.0*  HCT 32.2* 32.3* 28.7*  PLT 192 205 181  HEPARINUNFRC 0.30 0.44 0.34  CREATININE 0.74 0.75 0.75    Estimated Creatinine Clearance: 59.8 mL/min (by C-G formula based on SCr of 0.75 mg/dL).  Assessment: Pharmacy consulted to manage IV heparin  for ACS/STEMI.  Evaluated by cardiology for chest pain and increasingly elevated troponins - no further chest pain and no plans for cardiac catheterization. Dopplers + age indeterminate LLE DVT.  He was previously on apixaban  for DVT, but last filled 03/09/2023 and no longer taking outpatient.    Today, 10/24/2023:  Heparin  level  0.34, therapeutic on heparin  1600 units/hr after drip resumed yesterday @ 1750 after PAC removed by IR CBC: Hgb 9., Plt WNL IR removed PAC yesterday due to hematoma within port pocket, EBL < 5 ml No active bleeding or complications noted  Goal of Therapy:  Heparin  level 0.3-0.7 units/ml Monitor platelets by anticoagulation protocol: Yes   Plan:  - Continue heparin  drip at 1600 units/hr   - Monitor daily heparin  level, CBC, signs/symptoms of bleeding - consider transition to Eliquis  5 mg po bid once no more invasive procedures planned    Rosaline IVAR Edison, Pharm.D Use secure chat for questions 10/24/2023 9:18 AM

## 2023-10-24 NOTE — Progress Notes (Signed)
 Date of Admission:  10/18/2023     ID: Harold [name] is a 89 y.o. 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)   Bacteremia due to group B Streptococcus   Encounter for antineoplastic chemotherapy    Subjective: Doing okay  Medications:   acyclovir   400 mg Oral Daily   allopurinol   100 mg Oral BID   aspirin  EC  81 mg Oral Daily   atenolol   12.5 mg Oral Daily   brimonidine   1 drop Right Eye BID   Chlorhexidine  Gluconate Cloth  6 each Topical Daily   cholecalciferol   1,000 Units Oral Daily   cyanocobalamin   2,500 mcg Oral Daily   etoposide   100 mg Oral Daily   finasteride   5 mg Oral Daily   pantoprazole   40 mg Oral Daily   pravastatin   40 mg Oral QHS   predniSONE   60 mg Oral Q breakfast   sodium chloride  flush  10-40 mL Intracatheter Q12H    Objective: Vital signs in last 24 hours: Patient Vitals for the past 24 hrs:  BP Temp Temp src Pulse Resp SpO2 Weight  10/24/23 1500 (!) 105/42 -- -- 66 15 99 % --  10/24/23 1400 (!) 105/39 -- -- 63 13 99 % --  10/24/23 1301 -- 97.7 F (36.5 C) Oral -- -- -- --  10/24/23 1300 (!) 104/51 -- -- 70 19 100 % --  10/24/23 1200 (!) 97/39 -- -- 63 16 98 % --  10/24/23 1100 (!) 101/38 -- -- 62 16 100 % --  10/24/23 1015 (!) 113/44 -- -- 71 -- -- --  10/24/23 1000 -- -- -- 90 18 100 % --  10/24/23 0900 -- -- -- 98 (!) 21 100 % --  10/24/23 0824 -- 99 F (37.2 C) Oral -- -- -- --  10/24/23 0800 (!) 111/47 -- -- 69 11 98 % --  10/24/23 0700 (!) 112/41 -- -- 79 14 99 % --  10/24/23 0600 (!) 119/40 -- -- 66 11 99 % --  10/24/23 0500 (!) 116/43 -- -- 65 13 95 % 65.6 kg  10/24/23 0400 (!) 108/49 -- -- 67 13 98 % --  10/24/23 0330 -- (!) 97.5 F (36.4 C) Oral -- -- -- --  10/24/23 0100 (!) 103/42 --  -- 65 10 97 % --  10/24/23 0000 (!) 100/44 (!) 97.5 F (36.4 C) Axillary 69 10 97 % --  10/23/23 2300 (!) 107/44 -- -- 80 15 97 % --  10/23/23 2200 (!) 113/56 -- -- 77 14 99 % --  10/23/23 2100 (!) 106/38 -- -- 85 16 97 % --  10/23/23 2008 -- 97.8 F (36.6 C) Oral -- -- -- --  10/23/23 2000 (!) 104/40 97.8 F (36.6 C) Oral 91 18 97 % --  10/23/23 1800 (!) 106/42 -- -- 78 16 97 % --  10/23/23 1700 113/69 -- -- (!) 59 18 100 % --       PHYSICAL EXAM:  General: Alert, cooperative, no distress, appears stated age.  Seb derm face Rt facial mass- less erythematous Lungs: Clear to auscultation bilaterally. No Wheezing or Rhonchi. No rales. Heart: Regular rate and rhythm, no murmur, rub or gallop. Port removed Abdomen:  Soft, non-tender,not distended. Bowel sounds normal. No masses Extremities: edema legs Erythema rt leg minimal Skin: No rashes or lesions. Or bruising Lymph: Cervical, supraclavicular normal. Neurologic: Grossly non-focal  Lab Results    Latest Ref Rng & Units 10/24/2023    8:18 AM 10/23/2023    5:00 AM 10/22/2023    5:04 AM  CBC  WBC 4.0 - 10.5 K/uL 10.4  10.2  6.4   Hemoglobin 13.0 - 17.0 g/dL 9.0  89.6  9.9   Hematocrit 39.0 - 52.0 % 28.7  32.3  32.2   Platelets 150 - 400 K/uL 181  205  192        Latest Ref Rng & Units 10/24/2023    8:18 AM 10/23/2023    5:00 AM 10/22/2023    5:04 AM  CMP  Glucose 70 - 99 mg/dL 872  896  90   BUN 8 - 23 mg/dL 24  18  18    Creatinine 0.61 - 1.24 mg/dL 9.24  9.24  9.25   Sodium 135 - 145 mmol/L 136  136  133   Potassium 3.5 - 5.1 mmol/L 3.9  3.6  3.6   Chloride 98 - 111 mmol/L 101  98  97   CO2 22 - 32 mmol/L 24  28  28    Calcium 8.9 - 10.3 mg/dL 8.5  8.3  7.9   Total Protein 6.5 - 8.1 g/dL 5.2  5.2    Total Bilirubin 0.0 - 1.2 mg/dL 0.6  0.6    Alkaline Phos 38 - 126 U/L 37  45    AST 15 - 41 U/L 15  17    ALT 0 - 44 U/L 9  11        Microbiology: 10/18/23 BC 1 set of 2 group b streptococcus 10/19/23 BC-  NG Studies/Results: IR REMOVAL TUN ACCESS W/ PORT W/O FL MOD SED Result Date: 10/23/2023 CLINICAL DATA:  89 year old male with hematoma and bacteremia after recent port placement. EXAM: REMOVAL OF IMPLANTED TUNNELED PORT-A-CATH MEDICATIONS: None. ANESTHESIA/SEDATION: None. FLUOROSCOPY TIME:  None PROCEDURE: Informed written consent was obtained from the patient after a discussion of the risk, benefits and alternatives to the procedure. The patient was positioned supine on the fluoroscopy table and the right chest Port-A-Cath site was prepped with chlorhexidine . A sterile gown and gloves were worn during the procedure. Local anesthesia was provided with 1% lidocaine  with epinephrine . A timeout was performed prior to the initiation of the procedure. An incision was made overlying the Port-A-Cath with a #15 scalpel. Utilizing sharp and blunt dissection, the Port-A-Cath was removed completely. The pocket was irrigated with sterile saline. There was dark sanguinous material within the port pocket. Wound closure was performed with deep dermal interrupted 3-0 Vicryl and Dermabond. A dressing was placed. The patient tolerated the procedure well without immediate post procedural complication. FINDINGS: Successful removal of implant Port-A-Cath without immediate post procedural complication. IMPRESSION: Successful removal of implanted Port-A-Cath. Ester Sides, MD [facility] Electronically Signed   By: Ester Sides M.D.   On: 10/23/2023 15:30     Assessment/Plan:  Fever sudden onset in a patient with a rapidly growing rt facial mass which was recently biopsied Group B streptococcus bacteremia 1 bottle of 4   Source could be rt leg cellulitis- resolved rt facial mass, biopsy site Or PORT- which has been removed Repeat blood culture from 10/19/23- NG Low risk for endocarditis- 2 d echo okay valves have calcification ( A/M) On penicillin   IV infusion day 6  . On discharge can be switched to PO Amoxicillin  1 gram Three times a day until 10/30/23    Lymphoma with large rt facial mass- started chemo here   ? Anemia   High trponin- demand ischemia in a patient with CAD and stents  DVT-left PT and Pveins-age indeterminate  Discussed the management with the patient in detail  Discussed with  hospitalist Dr.Chiu  ID will sign off- call if needed

## 2023-10-24 NOTE — Telephone Encounter (Signed)
 Chemotherapy Pharmacist Encounter  Patient currently admitted during first cycle of regimen. I placed the oral etoposide  inpatients orders per Dr. [name] and sent back for cosign. Inpatient will verify prescription. Daughter was able to bring in the medication this AM, inpatient is aware medication is to be refrigerated. This will only be administered on Day 2 and Day 3. I expect additional education will be needed prior to cycle 2 of therapy in the outpatient setting.   Daughter was spoken to for the overview of: Vepesid  (etoposide ) for the treatment of  high-grade lymphoma for the regimen of R-CEOP (rituximab , vincristine , cyclophosphamide , IV etoposide  and prednisone ), planned duration for 6 cycles or until disease progression or unacceptable toxicity. Oral etoposide  will be given on day 2 and day 3 of the 21 day regimen and to be administered for the 6 cycles or until disease progression or unacceptable toxicity..   Patients daughter was counseled patient on administration, dosing, side effects, monitoring, drug-food interactions, safe handling, storage, and disposal. She is aware it will need to be kept refrigerated and did keep it in the refrigerator yesterday when it was delivered from [facility] specialty pharmacy.   Patient will take etoposide  50mg  capsules,2 capsules (100mg ) by mouth daily for 2 days. See previous note regarding dosing.   Regimen start date: 10/23/23 Oral etoposide  (day 2 and 3) start date: 10/24/23  Adverse effects include but are not limited to: decreased blood counts, nausea, vomiting, alopecia, and diarrhea.  Patient has nausea medications with Zofran  in inpatient encounter although currently set for PRN starting tomorrow, 8/9. I notified inpatient pharmacists they may need today if nausea begins.  Reviewed with patient importance of keeping a medication schedule and plan for any missed doses. No barriers to medication adherence identified. Distress thermometer not completed  during telephone call as patient has been on previous lines of therapy and is currently inpatient.    Medication reconciliation performed and medication/allergy list updated. All questions answered. Patients daughter voiced understanding and appreciation. Medication education handout placed in mail for patient. Patient knows to call the office with questions or concerns. Oral Chemotherapy Clinic phone number provided to patient.   Darlin Stenseth, PharmD Hematology/Oncology Clinical Pharmacist [facility] [phone] 10/24/2023 8:53 AM

## 2023-10-24 NOTE — Progress Notes (Addendum)
 Progress Note   Patient: Harold [name] FMW:[id number] DOB: Aug 06, 1934 DOA: 10/18/2023     6 DOS: the patient was seen and examined on 10/24/2023   Brief hospital course: 89 y.o. 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   Patient recently admitted 7/28 - 7/31 for rapidly enlarging right neck mass concerning for high-grade lymphoma.  Seen by medical oncology and underwent IR biopsy and Port-A-Cath placement on 7/30 and subsequently discharged home on prednisone  with planned initiation of chemotherapy on 8/4.   In the ED, temperature 98.9 F, HR 110, RR 20, BP 83/48.  WBC 9.5, hemoglobin 11.7, platelet count 235.  Sodium 141, potassium 3.3, chloride 101, CO2 26, glucose 99, BUN 35, creatinine 0.77.  High sensitive troponin 18>48>421.  BNP 625.0.  Lactic acid 2.6.  Urinalysis unrevealing.  Influenza/RSV/COVID PCR negative.  Blood cultures x 2 and urine culture obtained.  Chest x-ray with no acute cardiopulmonary disease process.  Ultrasound bilateral lower extremities with no evidence of DVT RLE, findings of age-indeterminate DVT left posterior tibial veins and left peroneal veins.  Patient was aggressively resuscitated with IV fluids, started on vancomycin  and cefepime  and placed on a norepinephrine  drip, heparin  drip.  Cardiology was consulted.  PCCM was consulted and patient was admitted to the intensive care unit.   Significant hospital events: 8/2: admit by PCCM to ICU, Vanc/Cefepime , U/S LE with age indeterminate DVT LLE, heparin  gtt, Cards consulted, on Levophed  drip 8/3: Blood Cx 1 out 4 (aerobic) + Streptococcus per BCID, antibiotics changed to vancomycin  and cefazolin ; titrated off Levophed , TTE completed 8/4: Transfer to SDU under  TRH, antibiotics further de-escalated to penicillin , ID consulted, CT soft tissue neck w/o concern for hemorrhage within right neck mass  Assessment and Plan: Septic shock secondary to group B Streptococcus bacteremia Right lower extremity cellulitis Patient presenting to the ED with fever, weakness.  Recently diagnosed with presumed high-grade lymphoma in which she underwent recent Port-A-Cath placement and biopsy of right neck mass on 7/30.  Potential etiology for bacterial translocation.  On arrival to the ED patient was afebrile with temperature 98.9, and WBC count 9.5.  Elevated lactic acid 2.6.  Patient was aggressively resuscitated, but remained hypotensive and subsequently placed on vasopressor support.  Patient was admitted to the intensive care unit and started on IV vancomycin  and cefepime .  Blood cultures 1 out of 4 growing Streptococcus antibiotics were de-escalated to vancomycin  and cefazolin  and now to penicillin  alone.  TTE with LVEF 60 to 65%, moderate thickening of the aortic valve but no other valvular abnormalities identified.  Vasopressors were weaned off. -- Infectious disease following, appreciate assistance -- Blood Cx x 2 8/2: 1/4 (aerobic) + group B Streptococcus -- Repeat blood cultures 8/3: no growth  -- Urine culture: No growth -- Continued on IV penicillin  as of 8/4 --Discussed with ID. At time of d/c recommendation for amoxicillin  1 gram TID until 10/30/23    NSTEMI Patient presenting with chest pain, notably elevated troponin.  History of CAD with LHC 01/2023 60% ostial to prox RCA, 30% mid RCA, 40% prox LCx, 80% mid LAD proximal to prior LAD stent, 30% ISR of mid LAD stent , 50% dLAD and then occluded dLAD status post intracoronary lithotripsy of heavily calcified LAD with suboptimal expansion  with balloon and therefore stent was not placed.  TTE with LVEF 60-65%, no regional wall motion abnormalities, grade 2 diastolic dysfunction. -- Cardiology following, appreciate  assistance, now signed off -- Had been on heparin  drip, plan transition to DOAC in AM if stable   Low-grade follicular lymphoma Rapidly enlarging right neck mass Concern for high-grade lymphoma Patient recently admitted for rapidly enlarging right neck mass with concern for high-grade lymphoma.  CT neck with contrast 7/29: Very large and transfacial up to 12 cm long axis lymphomatous mass about the right mandible, corresponding to the large FDG avid tumor in June.  Adjacent abnormal level 1A lymph node in the midline is new or progressed since June. S/p IR core biopsies and Port-A-Cath placement on 7/30.  CT soft tissue neck 8/4 negative for hemorrhage within the right neck mass. -- Follows with Medical Oncology, Dr. [name] -- Pathology from lymph node biopsy 7/30: pos for diffuse large B-cell lymphoma -- Oncology to initiate chemo inpt. Now on PO etoposide  8/8 to 8/9. Plan IV rituximab  next week   Age indeterminant LLE DVT Vascular duplex ultrasound on admission notable for age-indeterminate DVT left posterior tibial veins and left peroneal veins.  Previously known history of DVT no longer on Xarelto. -- Heparin  resumed. Plan DOAC at time of d/c   Hypokalemia Normalized   Essential hypertension Chronic diastolic congestive heart failure, compensated -- Atenolol  12.5 mg p.o. daily -- Furosemide  40 mg IV every 24 hours -- Continue monitor BP closely -- Strict I's and O's and daily weights - Lasix  held given soft bp per Cardiology -Cardiology recommends maintenance dose of lasix  at d/c   Hyperlipidemia -- Continue pravastatin  40 mg PO daily   CAD s/p remote stents Denies chest pain/angina.  No longer on Brilinta . -- Continue statin, atenolol    BPH -- Continue finasteride  5 mg p.o. daily   Anemia of chronic disease Hemoglobin stable      Subjective: Complains of bleeding from IV that was accidentally pulled out overnight  Physical Exam: Vitals:   10/24/23 1300 10/24/23 1301  10/24/23 1400 10/24/23 1500  BP: (!) 104/51  (!) 105/39 (!) 105/42  Pulse: 70  63 66  Resp: 19  13 15   Temp:  97.7 F (36.5 C)    TempSrc:  Oral    SpO2: 100%  99% 99%  Weight:      Height:       General exam: Awake, laying in bed, in nad Respiratory system: Normal respiratory effort, no wheezing Cardiovascular system: regular rate, s1, s2 Gastrointestinal system: Soft, nondistended, positive BS Central nervous system: CN2-12 grossly intact, strength intact Extremities: Perfused, no clubbing Skin: Normal skin turgor, no notable skin lesions seen Psychiatry: Mood normal // no visual hallucinations   Data Reviewed:  Labs reviewed: Na 136, K 3.9, Cr 0.75, WBC 10.4, Hgb 9.0, Plts 181  Family Communication: Pt in room, family at bedside  Disposition: Status is: Inpatient Remains inpatient appropriate because: severity of illness  Planned Discharge Destination: Home    Author: Garnette Pelt, MD 10/24/2023 4:16 PM  For on call review [email or web address].

## 2023-10-24 NOTE — Plan of Care (Signed)

## 2023-10-25 ENCOUNTER — Encounter: Payer: Self-pay | Admitting: Hematology

## 2023-10-25 ENCOUNTER — Other Ambulatory Visit: Payer: Self-pay

## 2023-10-25 ENCOUNTER — Other Ambulatory Visit (HOSPITAL_COMMUNITY): Payer: Self-pay

## 2023-10-25 DIAGNOSIS — C8338 Diffuse large B-cell lymphoma, lymph nodes of multiple sites: Secondary | ICD-10-CM | POA: Diagnosis not present

## 2023-10-25 DIAGNOSIS — R6521 Severe sepsis with septic shock: Secondary | ICD-10-CM | POA: Diagnosis not present

## 2023-10-25 DIAGNOSIS — Z5111 Encounter for antineoplastic chemotherapy: Secondary | ICD-10-CM | POA: Diagnosis not present

## 2023-10-25 DIAGNOSIS — A419 Sepsis, unspecified organism: Secondary | ICD-10-CM | POA: Diagnosis not present

## 2023-10-25 LAB — COMPREHENSIVE METABOLIC PANEL WITH GFR
ALT: 11 U/L (ref 0–44)
AST: 16 U/L (ref 15–41)
Albumin: 2.2 g/dL — ABNORMAL LOW (ref 3.5–5.0)
Alkaline Phosphatase: 36 U/L — ABNORMAL LOW (ref 38–126)
Anion gap: 12 (ref 5–15)
BUN: 28 mg/dL — ABNORMAL HIGH (ref 8–23)
CO2: 23 mmol/L (ref 22–32)
Calcium: 8.5 mg/dL — ABNORMAL LOW (ref 8.9–10.3)
Chloride: 104 mmol/L (ref 98–111)
Creatinine, Ser: 0.81 mg/dL (ref 0.61–1.24)
GFR, Estimated: >60 mL/min (ref >60)
Glucose, Bld: 133 mg/dL — ABNORMAL HIGH (ref 70–99)
Potassium: 4.1 mmol/L (ref 3.5–5.1)
Sodium: 139 mmol/L (ref 135–145)
Total Bilirubin: 0.6 mg/dL (ref 0.0–1.2)
Total Protein: 5.2 g/dL — ABNORMAL LOW (ref 6.5–8.1)

## 2023-10-25 LAB — CBC
HCT: 27.4 % — ABNORMAL LOW (ref 39.0–52.0)
Hemoglobin: 8.5 g/dL — ABNORMAL LOW (ref 13.0–17.0)
MCH: 27.4 pg (ref 26.0–34.0)
MCHC: 31 g/dL (ref 30.0–36.0)
MCV: 88.4 fL (ref 80.0–100.0)
Platelets: 176 K/uL (ref 150–400)
RBC: 3.1 MIL/uL — ABNORMAL LOW (ref 4.22–5.81)
RDW: 14.9 % (ref 11.5–15.5)
WBC: 7.6 K/uL (ref 4.0–10.5)
nRBC: 0 % (ref 0.0–0.2)

## 2023-10-25 LAB — HEPARIN LEVEL (UNFRACTIONATED): Heparin Unfractionated: 0.29 [IU]/mL — ABNORMAL LOW (ref 0.30–0.70)

## 2023-10-25 MED ORDER — AMOXICILLIN 500 MG PO CAPS
1000.0000 mg | ORAL_CAPSULE | Freq: Three times a day (TID) | ORAL | 0 refills | Status: AC
  Filled 2023-10-25: qty 30, 5d supply, fill #0

## 2023-10-25 MED ORDER — PROCHLORPERAZINE EDISYLATE 10 MG/2ML IJ SOLN
10.0000 mg | Freq: Four times a day (QID) | INTRAMUSCULAR | Status: DC | PRN
  Administered 2023-10-25: 10 mg via INTRAVENOUS
  Filled 2023-10-25: qty 2

## 2023-10-25 MED ORDER — PREDNISONE 20 MG PO TABS
60.0000 mg | ORAL_TABLET | Freq: Every day | ORAL | 0 refills | Status: AC
  Filled 2023-10-25: qty 9, 3d supply, fill #0

## 2023-10-25 MED ORDER — APIXABAN 5 MG PO TABS
5.0000 mg | ORAL_TABLET | Freq: Two times a day (BID) | ORAL | Status: DC
  Administered 2023-10-25: 5 mg via ORAL
  Filled 2023-10-25: qty 1

## 2023-10-25 MED ORDER — ASPIRIN 81 MG PO TBEC: 81.0000 mg | DELAYED_RELEASE_TABLET | Freq: Every day | ORAL | Status: DC

## 2023-10-25 MED ORDER — ATENOLOL 25 MG PO TABS
12.5000 mg | ORAL_TABLET | Freq: Every day | ORAL | 0 refills | Status: DC
  Filled 2023-10-25: qty 30, 60d supply, fill #0

## 2023-10-25 MED ORDER — FILGRASTIM-AAFI 480 MCG/0.8ML IJ SOSY: 480.0000 ug | PREFILLED_SYRINGE | Freq: Every day | INTRAMUSCULAR | Status: DC

## 2023-10-25 MED ORDER — DOCUSATE SODIUM 100 MG PO CAPS
100.0000 mg | ORAL_CAPSULE | Freq: Two times a day (BID) | ORAL | 0 refills | Status: DC | PRN
  Filled 2023-10-25: qty 30, 15d supply, fill #0

## 2023-10-25 NOTE — Evaluation (Signed)
 Physical Therapy Evaluation Patient Details Name: JAZZ ROGALA MRN: [id number] DOB: Apr 27, 1934 Today's Date: 10/25/2023  History of Present Illness  Jakori Burkett is an 89 year old male admitted with dx of Septic shock secondary to group B Streptococcus bacteremia, R LE extremity cellulitis PMH: cervical/right facial mass, HTN, HLD, CAD s/p stents remotely, chronic diastolic CHF, DVT no longer on Xarelto  Clinical Impression  PTA, patient lives alone with daughter and BIL in home beside his property. Patient reports mowing his grass last week and using rollator for ambulation, DME for showers but able to drive locally and prepare meals. Family assist with groceries and as needed intermittently. Currently, patient presents with deficits outlined below (see PT Problem List for details) most significantly LE edema, decreased higher level activity tolerance, postural deficits and decreased balance impacting functional mobility but with rolllator this session, able to amb household distances with support for IV pole and lines/leads with supervision. Patient requires continued Acute care hospital level PT services to progress safety and functional performance and allow for discharge. No follow up PT recommended with family support and intermittent assistance     p  If plan is discharge home, recommend the following: Assist for transportation;Help with stairs or ramp for entrance;Assistance with cooking/housework   Can travel by private vehicle        Equipment Recommendations None recommended by PT  Recommendations for Other Services       Functional Status Assessment Patient has had a recent decline in their functional status and demonstrates the ability to make significant improvements in function in a reasonable and predictable amount of time.     Precautions / Restrictions Precautions Precautions: Fall Restrictions Weight Bearing Restrictions Per Provider Order: No      Mobility  Bed  Mobility Overal bed mobility: Modified Independent             General bed mobility comments: with HOB elevated, sleeps in lift chair at home    Transfers Overall transfer level: Needs assistance Equipment used: Rollator (4 wheels) Transfers: Sit to/from Stand, Bed to chair/wheelchair/BSC Sit to Stand: Supervision   Step pivot transfers: Supervision       General transfer comment: min cues for hand placement and pacing, assist for IV lines and leads    Ambulation/Gait Ambulation/Gait assistance: Contact guard assist, Supervision Gait Distance (Feet): 140 Feet Assistive device: Rollator (4 wheels) Gait Pattern/deviations: Step-through pattern, Decreased step length - right, Decreased step length - left, Shuffle, Trunk flexed       General Gait Details: cues for posture and position from RW  Stairs            Wheelchair Mobility     Tilt Bed    Modified Rankin (Stroke Patients Only)       Balance Overall balance assessment: Mild deficits observed, not formally tested                                           Pertinent Vitals/Pain Pain Assessment Pain Assessment: No/denies pain    Home Living Family/patient expects to be discharged to:: Private residence Living Arrangements: Alone Available Help at Discharge: Family Type of Home: House Home Access: Stairs to enter Entrance Stairs-Rails: Left Entrance Stairs-Number of Steps: 2-3   Home Layout: One level Home Equipment: Rollator (4 wheels);Shower seat;Lift chair      Prior Function Prior Level of Function :  Independent/Modified Independent             Mobility Comments: was mowing grass last week; indep and uses rollator ADLs Comments: drives short distances, cooks, dtr assists to obtain groceries     Extremity/Trunk Assessment   Upper Extremity Assessment Upper Extremity Assessment: Defer to OT evaluation;Right hand dominant;Overall WFL for tasks assessed    Lower  Extremity Assessment Lower Extremity Assessment: Generalized weakness (But WFL and with noted bil edema)    Cervical / Trunk Assessment Cervical / Trunk Assessment: Kyphotic;Other exceptions (forward head posture)  Communication   Communication Communication: Impaired Factors Affecting Communication: Hearing impaired    Cognition Arousal: Alert Behavior During Therapy: WFL for tasks assessed/performed                             Following commands: Intact       Cueing Cueing Techniques: Verbal cues     General Comments General comments (skin integrity, edema, etc.): moderate +2 B LE edema, several sacral pads on buttocks and spine for protection of bony promininences, no SOB with 100% SpO2 post amb in hallway    Exercises     Assessment/Plan    PT Assessment Patient needs continued PT services  PT Problem List Decreased strength;Decreased activity tolerance;Decreased mobility;Decreased balance;Decreased knowledge of use of DME       PT Treatment Interventions DME instruction;Gait training;Functional mobility training;Therapeutic activities;Therapeutic exercise;Stair training;Balance training;Patient/family education    PT Goals (Current goals can be found in the Care Plan section)  Acute Rehab PT Goals Patient Stated Goal: Regain IND and GO HOME PT Goal Formulation: With patient Time For Goal Achievement: 11/01/23 Potential to Achieve Goals: Good    Frequency Min 3X/week     Co-evaluation PT/OT/SLP Co-Evaluation/Treatment: Yes Reason for Co-Treatment: For patient/therapist safety PT goals addressed during session: Mobility/safety with mobility;Balance;Proper use of DME OT goals addressed during session: ADL's and self-care;Proper use of Adaptive equipment and DME       AM-PAC PT 6 Clicks Mobility  Outcome Measure Help needed turning from your back to your side while in a flat bed without using bedrails?: None Help needed moving from lying on  your back to sitting on the side of a flat bed without using bedrails?: None Help needed moving to and from a bed to a chair (including a wheelchair)?: A Little Help needed standing up from a chair using your arms (e.g., wheelchair or bedside chair)?: A Little Help needed to walk in hospital room?: A Little Help needed climbing 3-5 steps with a railing? : A Little 6 Click Score: 20    End of Session Equipment Utilized During Treatment: Gait belt Activity Tolerance: Patient tolerated treatment well Patient left: in bed;with call bell/phone within reach;with family/visitor present Nurse Communication: Mobility status PT Visit Diagnosis: Difficulty in walking, not elsewhere classified (R26.2)    Time: 8665-8597 PT Time Calculation (min) (ACUTE ONLY): 28 min   Charges:   PT Evaluation $PT Eval Low Complexity: 1 Low   PT General Charges $$ ACUTE PT VISIT: 1 Visit         [facility] PT Acute Rehabilitation Services Office [phone]   Treyvion Durkee 10/25/2023, 4:15 PM

## 2023-10-25 NOTE — Discharge Summary (Signed)
 Physician Discharge Summary   Patient: Harold [name] MRN: [id number] DOB: Oct 07, 1934  Admit date:     10/18/2023  Discharge date: 10/25/23  Discharge Physician: Garnette Pelt   PCP: [name], MD   Recommendations at discharge:    Follow up with PCP in 1-2 weeks Follow up with Dr. 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 *  Hospital Course: 89 y.o. 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   Patient recently admitted 7/28 - 7/31 for rapidly enlarging right neck mass concerning for high-grade lymphoma.  Seen by medical oncology and underwent IR biopsy and Port-A-Cath placement on 7/30 and subsequently discharged home on prednisone  with planned initiation of chemotherapy on 8/4.   In the ED, temperature 98.9 F, HR 110, RR 20, BP 83/48.  WBC 9.5, hemoglobin 11.7, platelet count 235.  Sodium 141, potassium 3.3, chloride 101, CO2 26, glucose 99, BUN 35, creatinine 0.77.  High sensitive troponin 18>48>421.  BNP 625.0.  Lactic acid 2.6.  Urinalysis unrevealing.  Influenza/RSV/COVID PCR negative.  Blood cultures x 2 and urine culture obtained.  Chest x-ray with no acute cardiopulmonary disease process.  Ultrasound bilateral lower extremities with no evidence of DVT RLE, findings of age-indeterminate DVT left  posterior tibial veins and left peroneal veins.  Patient was aggressively resuscitated with IV fluids, started on vancomycin  and cefepime  and placed on a norepinephrine  drip, heparin  drip.  Cardiology was consulted.  PCCM was consulted and patient was admitted to the intensive care unit.   Significant hospital events: 8/2: admit by PCCM to ICU, Vanc/Cefepime , U/S LE with age indeterminate DVT LLE, heparin  gtt, Cards consulted, on Levophed  drip 8/3: Blood Cx 1 out 4 (aerobic) + Streptococcus per BCID, antibiotics changed to vancomycin  and cefazolin ; titrated off Levophed , TTE completed 8/4: Transfer to SDU under TRH, antibiotics further de-escalated to penicillin , ID consulted, CT soft tissue neck w/o concern for hemorrhage within right neck mass  Assessment and Plan: Septic shock secondary to group B Streptococcus bacteremia Right lower extremity cellulitis Patient presenting to the ED with fever, weakness.  Recently diagnosed with presumed high-grade lymphoma in which she underwent recent Port-A-Cath placement and biopsy of right neck mass on 7/30.  Potential etiology for bacterial translocation.  On arrival to the ED patient was afebrile with temperature 98.9, and WBC count 9.5.  Elevated lactic acid 2.6.  Patient was aggressively resuscitated, but remained hypotensive and subsequently placed on vasopressor support.  Patient was admitted to the intensive care unit and started on IV vancomycin  and cefepime .  Blood cultures 1 out of 4 growing Streptococcus antibiotics were de-escalated to vancomycin  and cefazolin  and now to penicillin  alone.  TTE with LVEF 60 to 65%, moderate thickening of the aortic valve but no other valvular abnormalities identified.  Vasopressors were weaned off. -- Infectious disease had been following, appreciate  assistance -- Blood Cx x 2 8/2: 1/4 (aerobic) + group B Streptococcus -- Repeat blood cultures 8/3: no growth  -- Urine culture: No growth -- Continued on IV penicillin   as of 8/4 --Discussed with ID. At time of d/c recommendation for amoxicillin  1 gram TID until 10/30/23    NSTEMI Patient presenting with chest pain, notably elevated troponin.  History of CAD with LHC 01/2023 60% ostial to prox RCA, 30% mid RCA, 40% prox LCx, 80% mid LAD proximal to prior LAD stent, 30% ISR of mid LAD stent , 50% dLAD and then occluded dLAD status post intracoronary lithotripsy of heavily calcified LAD with suboptimal expansion with balloon and therefore stent was not placed.  TTE with LVEF 60-65%, no regional wall motion abnormalities, grade 2 diastolic dysfunction. -- Cardiology following, appreciate assistance, now signed off -- Had been on heparin  drip, transitioned to eliquis  on dc   Low-grade follicular lymphoma Rapidly enlarging right neck mass Concern for high-grade lymphoma Patient recently admitted for rapidly enlarging right neck mass with concern for high-grade lymphoma.  CT neck with contrast 7/29: Very large and transfacial up to 12 cm long axis lymphomatous mass about the right mandible, corresponding to the large FDG avid tumor in June.  Adjacent abnormal level 1A lymph node in the midline is new or progressed since June. S/p IR core biopsies and Port-A-Cath placement on 7/30.  CT soft tissue neck 8/4 negative for hemorrhage within the right neck mass. -- Follows with Medical Oncology, Dr. [name] -- Pathology from lymph node biopsy 7/30: pos for diffuse large B-cell lymphoma -- Oncology to initiate chemo inpt. Now on PO etoposide  8/8 to 8/9. Plan IV rituximab  next week   Age indeterminant LLE DVT Vascular duplex ultrasound on admission notable for age-indeterminate DVT left posterior tibial veins and left peroneal veins.  Previously known history of DVT no longer on Xarelto. -- DOAC at time of d/c   Hypokalemia Normalized   Essential hypertension Chronic diastolic congestive heart failure, compensated -- Atenolol  12.5 mg p.o. daily -- was on Furosemide  40 mg IV  every 24 hours per Cardiololgy -Cardiology recommends maintenance dose of lasix  at d/c   Hyperlipidemia -- Continue pravastatin  40 mg PO daily   CAD s/p remote stents Denies chest pain/angina.  No longer on Brilinta . -- Continue statin, atenolol    BPH -- Continue finasteride  5 mg p.o. daily   Anemia of chronic disease Hemoglobin stable       Consultants: Cardiology, Oncology, ID, IR Procedures performed: Port removal  Disposition: Home Diet recommendation:  Regular diet DISCHARGE MEDICATION: Allergies as of 10/25/2023   No Known Allergies      Medication List     STOP taking these medications    dexamethasone  4 MG tablet Commonly known as: DECADRON    etoposide  50 MG capsule Commonly known as: VEPESID    potassium chloride  SA 20 MEQ tablet Commonly known as: KLOR-CON  M       TAKE these medications    acetaminophen  325 MG tablet Commonly known as: TYLENOL  Take 650 mg by mouth daily as needed for mild pain (pain score 1-3) or moderate pain (pain score 4-6).   acyclovir  400 MG tablet Commonly known as: ZOVIRAX  Take 1 tablet (400 mg total) by mouth daily.   allopurinol  100 MG tablet Commonly known as: Zyloprim  Take 1 tablet (100 mg total) by mouth 2 (two) times daily.   amoxicillin  500 MG tablet Commonly known as: AMOXIL  Take 2 tablets (1,000 mg total) by mouth 3 (three) times  daily for 5 days.   aspirin  EC 81 MG tablet Take 1 tablet (81 mg total) by mouth daily. Swallow whole. Start taking on: October 26, 2023   atenolol  25 MG tablet Commonly known as: TENORMIN  Take 0.5 tablets (12.5 mg total) by mouth daily. Start taking on: October 26, 2023 What changed: how much to take   brimonidine  0.2 % ophthalmic solution Commonly known as: ALPHAGAN  Place 1 drop into the right eye daily.   cholecalciferol  1000 units tablet Commonly known as: VITAMIN D  Take 2,000 Units by mouth daily.   cyanocobalamin  1000 MCG tablet Commonly known as: VITAMIN B12 Take  1,000 mcg by mouth daily.   docusate sodium  100 MG capsule Commonly known as: COLACE Take 1 capsule (100 mg total) by mouth 2 (two) times daily as needed for mild constipation.   Eliquis  5 MG Tabs tablet Generic drug: apixaban  Take 5 mg by mouth 2 (two) times daily.   finasteride  5 MG tablet Commonly known as: PROSCAR  Take 5 mg by mouth daily.   furosemide  20 MG tablet Commonly known as: LASIX  TAKE 1 TABLET BY MOUTH TWICE  DAILY   gabapentin  100 MG capsule Commonly known as: NEURONTIN  Take 100 mg by mouth daily as needed (for knee pain).   lidocaine -prilocaine  cream Commonly known as: EMLA  Apply to affected area once What changed:  how much to take how to take this when to take this reasons to take this additional instructions   nitroGLYCERIN  0.4 MG SL tablet Commonly known as: Nitrostat  Place 1 tablet (0.4 mg total) under the tongue every 5 (five) minutes as needed for chest pain.   omeprazole 40 MG capsule Commonly known as: PRILOSEC Take 40 mg by mouth daily.   ondansetron  8 MG tablet Commonly known as: Zofran  Take 1 tablet (8 mg total) by mouth every 8 (eight) hours as needed for nausea or vomiting. Start on day 3 after cyclophosphamide  chemotherapy.   pravastatin  40 MG tablet Commonly known as: PRAVACHOL  Take 1 tablet (40 mg total) by mouth at bedtime. What changed: when to take this   predniSONE  20 MG tablet Commonly known as: DELTASONE  Take 3 tablets (60 mg total) by mouth daily. Take on days 2-5 of chemotherapy.   prochlorperazine  10 MG tablet Commonly known as: COMPAZINE  Take 1 tablet (10 mg total) by mouth every 6 (six) hours as needed for nausea or vomiting.   sucralfate 1 g tablet Commonly known as: CARAFATE Take 1 g by mouth in the morning, at noon, and at bedtime.        Follow-up Information     Connect with your PCP/Specialist as discussed. Schedule an appointment as soon as possible for a visit .   Contact  information: [email or web address] Call our physician referral line at [phone].        [name], MD Follow up.   Specialties: Hematology, Oncology Why: Hospital follow up Contact information: [address] [place] [address] [phone]                Discharge Exam: Harold [name]   10/21/23 0500 10/23/23 0500 10/24/23 0500  Weight: 64.7 kg 65.7 kg 65.6 kg   General exam: Awake, laying in bed, in nad Respiratory system: Normal respiratory effort, no wheezing Cardiovascular system: regular rate, s1, s2 Gastrointestinal system: Soft, nondistended, positive BS Central nervous system: CN2-12 grossly intact, strength intact Extremities: Perfused, no clubbing Skin: Normal skin turgor, no notable skin lesions seen Psychiatry: Mood normal // no visual hallucinations  Condition at discharge: fair  The results of significant diagnostics from this hospitalization (including imaging, microbiology, ancillary and laboratory) are listed below for reference.   Imaging Studies: IR REMOVAL TUN ACCESS W/ PORT W/O FL MOD SED Result Date: 10/23/2023 CLINICAL DATA:  89 year old male with hematoma and bacteremia after recent port placement. EXAM: REMOVAL OF IMPLANTED TUNNELED PORT-A-CATH MEDICATIONS: None. ANESTHESIA/SEDATION: None. FLUOROSCOPY TIME:  None PROCEDURE: Informed written consent was obtained from the patient after a discussion of the risk, benefits and alternatives to the procedure. The patient was positioned supine on the fluoroscopy table and the right chest Port-A-Cath site was prepped with chlorhexidine . A sterile gown and gloves were worn during the procedure. Local anesthesia was provided with 1% lidocaine  with epinephrine . A timeout was performed prior to the initiation of the procedure. An incision was made overlying the Port-A-Cath with a #15 scalpel. Utilizing sharp and blunt dissection, the Port-A-Cath was removed  completely. The pocket was irrigated with sterile saline. There was dark sanguinous material within the port pocket. Wound closure was performed with deep dermal interrupted 3-0 Vicryl and Dermabond. A dressing was placed. The patient tolerated the procedure well without immediate post procedural complication. FINDINGS: Successful removal of implant Port-A-Cath without immediate post procedural complication. IMPRESSION: Successful removal of implanted Port-A-Cath. Ester Sides, MD [facility] Electronically Signed   By: Ester Sides M.D.   On: 10/23/2023 15:30   CT SOFT TISSUE NECK W CONTRAST Result Date: 10/20/2023 CLINICAL DATA:  Right neck mass EXAM: CT NECK WITH CONTRAST TECHNIQUE: Multidetector CT imaging of the neck was performed using the standard protocol following the bolus administration of intravenous contrast. RADIATION DOSE REDUCTION: This exam was performed according to the departmental dose-optimization program which includes automated exposure control, adjustment of the mA and/or kV according to patient size and/or use of iterative reconstruction technique. CONTRAST:  75mL OMNIPAQUE  IOHEXOL  300 MG/ML  SOLN COMPARISON:  10/14/2023 FINDINGS: Again noted is a large mass superficial to the mandible in the right-side of the neck. There is no hemorrhage. Pharynx: The nasopharynx, oropharynx and hypopharynx are normal Oral cavity/floor of mouth: Normal Larynx: Normal Salivary glands: The parotid and submandibular glands are normal Thyroid : Normal Lymph nodes: No adenopathy Vascular: No significant abnormality Limited intracranial: No significant abnormality Visualized orbits: No significant abnormality Mastoids and visualized paranasal sinuses: No significant abnormality Skeleton: No significant abnormality Upper chest: No significant abnormality Other: None IMPRESSION: Approximally 11.6 by 4 x 11 cm mass in the right side of the face/neck  unchanged from the prior study. No hemorrhage. Electronically Signed   By: Nancyann Burns M.D.   On: 10/20/2023 15:08   VAS US  LOWER EXTREMITY VENOUS (DVT) Result Date: 10/19/2023  Lower Venous DVT Study Patient Name:  Harold [name]  Date of Exam:   10/18/2023 Medical Rec #: [id number]       Accession #:    [id number] Date of Birth: 05/20/1934       Patient Gender: M Patient Age:   64 years Exam Location:  [facility] Procedure:      VAS US  LOWER EXTREMITY VENOUS (DVT) Referring Phys: LAMAR CHRIS --------------------------------------------------------------------------------  Indications: Right > L Edema.  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 Chronic HFpEF. Limitations: Pitting edema, involuntary patient movement. Comparison Study: Prior bilateral LEV done 05/05/20 indicating chronic left                   peroneal  DVT Performing Technologist: Alberta Lis RVS  Examination Guidelines: A complete evaluation includes B-mode imaging, spectral Doppler, color Doppler, and power Doppler as needed of all accessible portions of each vessel. Bilateral testing is considered an integral part of a complete examination. Limited examinations for reoccurring indications may be performed as noted. The reflux portion of the exam is performed with the patient in reverse Trendelenburg.  +---------+---------------+---------+-----------+----------+--------------+ RIGHT    CompressibilityPhasicitySpontaneityPropertiesThrombus Aging +---------+---------------+---------+-----------+----------+--------------+ CFV      Full           Yes      No                                  +---------+---------------+---------+-----------+----------+--------------+ SFJ      Full                                                        +---------+---------------+---------+-----------+----------+--------------+ FV Prox  Full           Yes       No                                  +---------+---------------+---------+-----------+----------+--------------+ FV Mid   Full                                                        +---------+---------------+---------+-----------+----------+--------------+ FV DistalFull                                                        +---------+---------------+---------+-----------+----------+--------------+ PFV      Full           Yes      No                                  +---------+---------------+---------+-----------+----------+--------------+ POP      Full           Yes      No                                  +---------+---------------+---------+-----------+----------+--------------+ PTV      Full                                                        +---------+---------------+---------+-----------+----------+--------------+ PERO     Full                                                        +---------+---------------+---------+-----------+----------+--------------+   +---------+---------------+---------+-----------+----------+-------------------+  LEFT     CompressibilityPhasicitySpontaneityPropertiesThrombus Aging      +---------+---------------+---------+-----------+----------+-------------------+ CFV      Full           Yes      No                                       +---------+---------------+---------+-----------+----------+-------------------+ SFJ      Full                                                             +---------+---------------+---------+-----------+----------+-------------------+ FV Prox  Full           Yes      No                                       +---------+---------------+---------+-----------+----------+-------------------+ FV Mid   Full                                                             +---------+---------------+---------+-----------+----------+-------------------+ FV DistalFull            Yes      No                                       +---------+---------------+---------+-----------+----------+-------------------+ PFV      Full                                                             +---------+---------------+---------+-----------+----------+-------------------+ POP                     Yes      No                   patent by color and                                                       Doppler             +---------+---------------+---------+-----------+----------+-------------------+ PTV      None                                         Age Indeterminate   +---------+---------------+---------+-----------+----------+-------------------+ PERO     None  Age Indeterminate   +---------+---------------+---------+-----------+----------+-------------------+     Summary: RIGHT: - There is no evidence of deep vein thrombosis in the lower extremity.  Interstitial edema noted throughout the extremity  LEFT: - Findings consistent with age indeterminate deep vein thrombosis involving the left posterior tibial veins, and left peroneal veins.  Interstitial edema noted throughout the extremity.  *See table(s) above for measurements and observations. Electronically signed by Debby Robertson on 10/19/2023 at 3:36:23 PM.    Final    ECHOCARDIOGRAM COMPLETE Result Date: 10/19/2023    ECHOCARDIOGRAM REPORT   Patient Name:   Harold [name] Date of Exam: 10/19/2023 Medical Rec #:  [id number]      Height:       66.0 in Accession #:    [id number]     Weight:       153.9 lb Date of Birth:  [birth year]      BSA:          1.789 m Patient Age:    88 years       BP:           111/48 mmHg Patient Gender: M              HR:           69 bpm. Exam Location:  Inpatient Procedure: 2D Echo, Cardiac Doppler, Color Doppler, Strain Analysis and 3D Echo            (Both Spectral and Color Flow Doppler were utilized during            procedure). Indications:     Shock  History:        Patient has prior history of Echocardiogram examinations, most                 recent 10/15/2023. CAD and Previous Myocardial Infarction, Mitral                 Valve Disease and Aortic Valve Disease; Risk                 Factors:Hypertension and Dyslipidemia.  Sonographer:    Therisa Crouch Referring Phys: 39 ROBERT S BYRUM IMPRESSIONS  1. No new RWMA;s since TTE done 10/15/23. Left ventricular ejection fraction, by estimation, is 60 to 65%. The left ventricle has normal function. The left ventricle has no regional wall motion abnormalities. Left ventricular diastolic parameters are consistent with Grade II diastolic dysfunction (pseudonormalization). Elevated left ventricular end-diastolic pressure. The average left ventricular global longitudinal strain is -19.9 %. The global longitudinal strain is normal.  2. Right ventricular systolic function is normal. The right ventricular size is normal.  3. Right atrial size was mildly dilated.  4. The mitral valve is degenerative. Trivial mitral valve regurgitation. Mild mitral stenosis. Moderate mitral annular calcification.  5. Tricuspid valve regurgitation is moderate.  6. The aortic valve is tricuspid. There is moderate calcification of the aortic valve. There is moderate thickening of the aortic valve. Aortic valve regurgitation is not visualized. Mild to moderate aortic valve stenosis.  7. The inferior vena cava is normal in size with greater than 50% respiratory variability, suggesting right atrial pressure of 3 mmHg. FINDINGS  Left Ventricle: No new RWMA;s since TTE done 10/15/23. Left ventricular ejection fraction, by estimation, is 60 to 65%. The left ventricle has normal function. The left ventricle has no regional wall motion abnormalities. The average left ventricular global longitudinal strain is -19.9 %. Strain was performed and the global longitudinal strain is  normal. The left ventricular internal cavity size was normal in size. There  is no left ventricular hypertrophy. Left ventricular diastolic parameters are consistent with Grade II diastolic dysfunction (pseudonormalization). Elevated left ventricular end-diastolic pressure. Right Ventricle: The right ventricular size is normal. No increase in right ventricular wall thickness. Right ventricular systolic function is normal. Left Atrium: Left atrial size was normal in size. Right Atrium: Right atrial size was mildly dilated. Pericardium: There is no evidence of pericardial effusion. Mitral Valve: The mitral valve is degenerative in appearance. Moderate mitral annular calcification. Trivial mitral valve regurgitation. Mild mitral valve stenosis. MV peak gradient, 8.9 mmHg. The mean mitral valve gradient is 5.0 mmHg. Tricuspid Valve: The tricuspid valve is normal in structure. Tricuspid valve regurgitation is moderate . No evidence of tricuspid stenosis. Aortic Valve: The aortic valve is tricuspid. There is moderate calcification of the aortic valve. There is moderate thickening of the aortic valve. Aortic valve regurgitation is not visualized. Mild to moderate aortic stenosis is present. Aortic valve mean gradient measures 11.7 mmHg. Aortic valve peak gradient measures 20.4 mmHg. Aortic valve area, by VTI measures 1.32 cm. Pulmonic Valve: The pulmonic valve was normal in structure. Pulmonic valve regurgitation is trivial. No evidence of pulmonic stenosis. Aorta: The aortic root is normal in size and structure. Venous: The inferior vena cava is normal in size with greater than 50% respiratory variability, suggesting right atrial pressure of 3 mmHg. IAS/Shunts: No atrial level shunt detected by color flow Doppler. Additional Comments: 3D was performed not requiring image post processing on an independent workstation and was indeterminate.  LEFT VENTRICLE PLAX 2D LVIDd:         3.80 cm   Diastology LVIDs:         2.50 cm   LV e' medial:    6.64 cm/s LV PW:         0.90 cm   LV E/e' medial:  17.3  LV IVS:        0.90 cm   LV e' lateral:   7.83 cm/s LVOT diam:     2.00 cm   LV E/e' lateral: 14.7 LV SV:         66 LV SV Index:   37        2D Longitudinal Strain LVOT Area:     3.14 cm  2D Strain GLS Avg:     -19.9 %                           3D Volume EF                          LV 3D EDV:   103.42 ml                          LV 3D ESV:   39.24 ml RIGHT VENTRICLE             IVC RV Basal diam:  3.60 cm     IVC diam: 3.00 cm RV S prime:     13.30 cm/s TAPSE (M-mode): 2.4 cm LEFT ATRIUM             Index        RIGHT ATRIUM           Index LA diam:        3.60 cm 2.01 cm/m   RA Area:  20.60 cm LA Vol (A2C):   77.6 ml 43.38 ml/m  RA Volume:   55.40 ml  30.97 ml/m LA Vol (A4C):   75.6 ml 42.26 ml/m LA Biplane Vol: 77.3 ml 43.21 ml/m  AORTIC VALVE AV Area (Vmax):    1.27 cm AV Area (Vmean):   1.23 cm AV Area (VTI):     1.32 cm AV Vmax:           226.00 cm/s AV Vmean:          158.333 cm/s AV VTI:            0.505 m AV Peak Grad:      20.4 mmHg AV Mean Grad:      11.7 mmHg LVOT Vmax:         91.05 cm/s LVOT Vmean:        62.100 cm/s LVOT VTI:          0.212 m LVOT/AV VTI ratio: 0.42  AORTA Ao Root diam: 3.50 cm Ao Asc diam:  3.20 cm MITRAL VALVE                TRICUSPID VALVE MV Area (PHT): 3.17 cm     TR Peak grad:   34.8 mmHg MV Area VTI:   1.32 cm     TR Vmax:        295.00 cm/s MV Peak grad:  8.9 mmHg MV Mean grad:  5.0 mmHg     SHUNTS MV Vmax:       1.49 m/s     Systemic VTI:  0.21 m MV Vmean:      106.5 cm/s   Systemic Diam: 2.00 cm MV Decel Time: 239 msec MV E velocity: 115.00 cm/s MV A velocity: 130.00 cm/s MV E/A ratio:  0.88 Maude Emmer MD Electronically signed by Maude Emmer MD Signature Date/Time: 10/19/2023/10:56:55 AM    Final    DG Chest Port 1 View Result Date: 10/18/2023 EXAM: 1 VIEW XRAY OF THE CHEST 10/18/2023 02:13:00 AM COMPARISON: 01/01/2011 CLINICAL HISTORY: 355200 Chest pain [id number]. Per chart: Patient BIB EMS for chest pain and fever that started last night. Patient was seen  here earlier this week and had port implanted for chemotherapy related to Non-Hodgkin's lymphoma. Patient given 324 ASA and 1 inch nitropaste from EMS. Denies any pain at this time. FINDINGS: LUNGS AND PLEURA: No focal pulmonary opacity. No pulmonary edema. No pleural effusion. No pneumothorax. HEART AND MEDIASTINUM: No acute abnormality of the cardiac and mediastinal silhouettes. BONES AND SOFT TISSUES: No acute osseous abnormality. Right chest wall port-a-cath with tip in the lower SVC. IMPRESSION: 1. No acute process. Electronically signed by: Franky Stanford MD 10/18/2023 02:24 AM EDT RP Workstation: [id number]   ECHOCARDIOGRAM COMPLETE Result Date: 10/15/2023    ECHOCARDIOGRAM REPORT   Patient Name:   KIELAN DREISBACH Date of Exam: 10/15/2023 Medical Rec #:  [id number]      Height:       66.0 in Accession #:    [id number]     Weight:       155.3 lb Date of Birth:  1934-06-19      BSA:          1.796 m Patient Age:    88 years       BP:           95/50 mmHg Patient Gender: M              HR:  68 bpm. Exam Location:  Inpatient Procedure: 2D Echo, 3D Echo, Strain Analysis, Cardiac Doppler and Color Doppler            (Both Spectral and Color Flow Doppler were utilized during            procedure). Indications:    CAD native vessel I25.10  History:        Patient has prior history of Echocardiogram examinations, most                 recent 01/29/2023. CAD and Previous Myocardial Infarction,                 Mitral Valve Disease; Risk Factors:Hypertension and                 Dyslipidemia.  Sonographer:    Koleen Popper RDCS Referring Phys: [phone] ANAND D HONGALGI  Sonographer Comments: Global longitudinal strain was attempted. IMPRESSIONS  1. Left ventricular ejection fraction, by estimation, is 60 to 65%. Left ventricular ejection fraction by 3D volume is 62 %. The left ventricle has normal function. The left ventricle has no regional wall motion abnormalities. Left ventricular diastolic  parameters are  consistent with Grade II diastolic dysfunction (pseudonormalization). The average left ventricular global longitudinal strain is -19.9 %. The global longitudinal strain is normal.  2. Right ventricular systolic function is mildly reduced. The right ventricular size is mildly enlarged. There is mildly elevated pulmonary artery systolic pressure. The estimated right ventricular systolic pressure is 41.6 mmHg.  3. The mitral valve is normal in structure. Mild mitral valve regurgitation. Mild mitral stenosis. Severe mitral annular calcification.  4. The tricuspid valve is degenerative. Tricuspid valve regurgitation is moderate.  5. The aortic valve has an indeterminant number of cusps. There is severe calcifcation of the aortic valve. Aortic valve regurgitation is mild. Mild aortic valve stenosis. Aortic valve Vmax measures 2.17 m/s.  6. The inferior vena cava is dilated in size with <50% respiratory variability, suggesting right atrial pressure of 15 mmHg. FINDINGS  Left Ventricle: Left ventricular ejection fraction, by estimation, is 60 to 65%. Left ventricular ejection fraction by 3D volume is 62 %. The left ventricle has normal function. The left ventricle has no regional wall motion abnormalities. The average left ventricular global longitudinal strain is -19.9 %. Strain was performed and the global longitudinal strain is normal. The left ventricular internal cavity size was normal in size. There is no left ventricular hypertrophy. Left ventricular diastolic parameters are consistent with Grade II diastolic dysfunction (pseudonormalization). Right Ventricle: The right ventricular size is mildly enlarged. No increase in right ventricular wall thickness. Right ventricular systolic function is mildly reduced. There is mildly elevated pulmonary artery systolic pressure. The tricuspid regurgitant  velocity is 2.58 m/s, and with an assumed right atrial pressure of 15 mmHg, the estimated right ventricular systolic  pressure is 41.6 mmHg. Left Atrium: Left atrial size was normal in size. Right Atrium: Right atrial size was normal in size. Pericardium: There is no evidence of pericardial effusion. Mitral Valve: The mitral valve is normal in structure. Severe mitral annular calcification. Mild mitral valve regurgitation. Mild mitral valve stenosis. MV peak gradient, 12.7 mmHg. The mean mitral valve gradient is 4.0 mmHg. Tricuspid Valve: The tricuspid valve is degenerative in appearance. Tricuspid valve regurgitation is moderate . No evidence of tricuspid stenosis. Aortic Valve: The aortic valve has an indeterminant number of cusps. There is severe calcifcation of the aortic valve. Aortic valve regurgitation is mild. Mild aortic stenosis  is present. Aortic valve mean gradient measures 11.0 mmHg. Aortic valve peak gradient measures 18.8 mmHg. Aortic valve area, by VTI measures 1.07 cm. Pulmonic Valve: The pulmonic valve was grossly normal. Pulmonic valve regurgitation is mild. No evidence of pulmonic stenosis. Aorta: The aortic root is normal in size and structure. Venous: The inferior vena cava is dilated in size with less than 50% respiratory variability, suggesting right atrial pressure of 15 mmHg. IAS/Shunts: No atrial level shunt detected by color flow Doppler. Additional Comments: 3D was performed not requiring image post processing on an independent workstation and was normal.  LEFT VENTRICLE PLAX 2D LVIDd:         3.60 cm         Diastology LVIDs:         2.30 cm         LV e' medial:    6.20 cm/s LV PW:         1.00 cm         LV E/e' medial:  18.9 LV IVS:        1.00 cm         LV e' lateral:   5.77 cm/s LVOT diam:     1.50 cm         LV E/e' lateral: 20.3 LV SV:         46 LV SV Index:   26              2D Longitudinal LVOT Area:     1.77 cm        Strain                                2D Strain GLS   -19.9 %                                Avg:                                 3D Volume EF                                 LV 3D EF:    Left                                             ventricul                                             ar                                             ejection                                             fraction  by 3D                                             volume is                                             62 %.                                 3D Volume EF:                                3D EF:        62 %                                LV EDV:       93 ml                                LV ESV:       35 ml                                LV SV:        58 ml RIGHT VENTRICLE             IVC RV Basal diam:  4.30 cm     IVC diam: 2.40 cm RV Mid diam:    3.80 cm RV S prime:     14.90 cm/s TAPSE (M-mode): 2.0 cm LEFT ATRIUM             Index        RIGHT ATRIUM           Index LA diam:        3.30 cm 1.84 cm/m   RA Area:     12.80 cm LA Vol (A2C):   37.9 ml 21.10 ml/m  RA Volume:   29.90 ml  16.65 ml/m LA Vol (A4C):   35.3 ml 19.65 ml/m LA Biplane Vol: 39.5 ml 21.99 ml/m  AORTIC VALVE AV Area (Vmax):    1.07 cm AV Area (Vmean):   0.94 cm AV Area (VTI):     1.07 cm AV Vmax:           217.00 cm/s AV Vmean:          157.000 cm/s AV VTI:            0.432 m AV Peak Grad:      18.8 mmHg AV Mean Grad:      11.0 mmHg LVOT Vmax:         131.00 cm/s LVOT Vmean:        83.200 cm/s LVOT VTI:          0.261 m LVOT/AV VTI ratio: 0.60  AORTA Ao Root diam: 2.70 cm Ao Asc diam:  3.30 cm MITRAL VALVE                TRICUSPID VALVE MV  Area (PHT): 2.16 cm     TR Peak grad:   26.6 mmHg MV Area VTI:   1.02 cm     TR Vmax:        258.00 cm/s MV Peak grad:  12.7 mmHg MV Mean grad:  4.0 mmHg     SHUNTS MV Vmax:       1.78 m/s     Systemic VTI:  0.26 m MV Vmean:      95.9 cm/s    Systemic Diam: 1.50 cm MV Decel Time: 351 msec MV E velocity: 117.00 cm/s MV A velocity: 149.00 cm/s MV E/A ratio:  0.79 Morene Brownie Electronically signed by Morene Brownie Signature  Date/Time: 10/15/2023/6:24:54 PM    Final    IR IMAGING GUIDED PORT INSERTION Result Date: 10/15/2023 INDICATION: 89 year old male with a known history of low-grade follicular lymphoma with enlarging face all mass concerning for transformation to high-grade lymphoma. He presents for port catheter placement to establish durable venous access in preparation for chemotherapy as well as ultrasound-guided core biopsy of the facial mass. EXAM: IMPLANTED PORT A CATH PLACEMENT WITH ULTRASOUND AND FLUOROSCOPIC GUIDANCE ULTRASOUND-GUIDED CORE BIOPSY MEDICATIONS: None. ANESTHESIA/SEDATION: Versed  2 mg IV; Fentanyl  100 mcg IV; administered by the radiology nurse Moderate Sedation Time:  42 minutes The patient's vital signs and level of consciousness were continuously monitored during the procedure by the interventional radiology nurse under my direct supervision. FLUOROSCOPY: Radiation exposure index: 3 mGy reference air kerma COMPLICATIONS: None immediate. PROCEDURE: The right neck and chest was prepped with chlorhexidine , and draped in the usual sterile fashion using maximum barrier technique (cap and mask, sterile gown, sterile gloves, large sterile sheet, hand hygiene and cutaneous antiseptic). Local anesthesia was attained by infiltration with 1% lidocaine  with epinephrine . Ultrasound demonstrated patency of the right internal jugular vein, and this was documented with an image. Under real-time ultrasound guidance, this vein was accessed with a 21 gauge micropuncture needle and image documentation was performed. A small dermatotomy was made at the access site with an 11 scalpel. A 0.018 wire was advanced into the SVC and the access needle exchanged for a 60F micropuncture vascular sheath. The 0.018 wire was then removed and a 0.035 wire advanced into the IVC. An appropriate location for the subcutaneous reservoir was selected below the clavicle and an incision was made through the skin and underlying soft tissues. The  subcutaneous tissues were then dissected using a combination of blunt and sharp surgical technique and a pocket was formed. A Bard Clear Vue single lumen power injectable portacatheter was then tunneled through the subcutaneous tissues from the pocket to the dermatotomy and the port reservoir placed within the subcutaneous pocket. The venous access site was then serially dilated and a peel away vascular sheath placed over the wire. The wire was removed and the port catheter advanced into position under fluoroscopic guidance. The catheter tip is positioned in the superior cavoatrial junction. This was documented with a spot image. The portacatheter was then tested and found to flush and aspirate well. The port was flushed with saline followed by 100 units/mL heparinized saline. The pocket was then closed in two layers using first subdermal inverted interrupted absorbable sutures followed by a running subcuticular suture. The epidermis was then sealed with Dermabond. The dermatotomy at the venous access site was also closed with Dermabond. The port catheter was left accessed and sterile bandages were applied. Next, the facial mass was evaluated. The mass was interrogated with ultrasound. A suitable biopsy entry site was  identified in the submental space allowing passage through relatively normal soft tissues in a region of relatively low vascularity within the mass. The skin was sterilely prepped and draped in the usual fashion using chlorhexidine  skin prep. Local anesthesia was attained by infiltration with 1% lidocaine . A small dermatotomy was made. Under real-time ultrasound guidance, multiple 18 gauge core biopsies were obtained using the Argon automated biopsy device. Biopsy specimens were placed on a moistened Telfa pad and submitted to pathology for further analysis. Post biopsy imaging demonstrates no evidence of active bleeding or other complication. IMPRESSION: Successful placement of a right IJ approach  Bard Clear Vue port catheter with ultrasound and fluoroscopic guidance. The catheter is ready for use. Successful ultrasound-guided biopsy of right facial mass. Electronically Signed   By: Wilkie Lent M.D.   On: 10/15/2023 11:35   IR US  Guide Bx Asp/Drain Result Date: 10/15/2023 INDICATION: 89 year old male with a known history of low-grade follicular lymphoma with enlarging face all mass concerning for transformation to high-grade lymphoma. He presents for port catheter placement to establish durable venous access in preparation for chemotherapy as well as ultrasound-guided core biopsy of the facial mass. EXAM: IMPLANTED PORT A CATH PLACEMENT WITH ULTRASOUND AND FLUOROSCOPIC GUIDANCE ULTRASOUND-GUIDED CORE BIOPSY MEDICATIONS: None. ANESTHESIA/SEDATION: Versed  2 mg IV; Fentanyl  100 mcg IV; administered by the radiology nurse Moderate Sedation Time:  42 minutes The patient's vital signs and level of consciousness were continuously monitored during the procedure by the interventional radiology nurse under my direct supervision. FLUOROSCOPY: Radiation exposure index: 3 mGy reference air kerma COMPLICATIONS: None immediate. PROCEDURE: The right neck and chest was prepped with chlorhexidine , and draped in the usual sterile fashion using maximum barrier technique (cap and mask, sterile gown, sterile gloves, large sterile sheet, hand hygiene and cutaneous antiseptic). Local anesthesia was attained by infiltration with 1% lidocaine  with epinephrine . Ultrasound demonstrated patency of the right internal jugular vein, and this was documented with an image. Under real-time ultrasound guidance, this vein was accessed with a 21 gauge micropuncture needle and image documentation was performed. A small dermatotomy was made at the access site with an 11 scalpel. A 0.018 wire was advanced into the SVC and the access needle exchanged for a 68F micropuncture vascular sheath. The 0.018 wire was then removed and a 0.035 wire  advanced into the IVC. An appropriate location for the subcutaneous reservoir was selected below the clavicle and an incision was made through the skin and underlying soft tissues. The subcutaneous tissues were then dissected using a combination of blunt and sharp surgical technique and a pocket was formed. A Bard Clear Vue single lumen power injectable portacatheter was then tunneled through the subcutaneous tissues from the pocket to the dermatotomy and the port reservoir placed within the subcutaneous pocket. The venous access site was then serially dilated and a peel away vascular sheath placed over the wire. The wire was removed and the port catheter advanced into position under fluoroscopic guidance. The catheter tip is positioned in the superior cavoatrial junction. This was documented with a spot image. The portacatheter was then tested and found to flush and aspirate well. The port was flushed with saline followed by 100 units/mL heparinized saline. The pocket was then closed in two layers using first subdermal inverted interrupted absorbable sutures followed by a running subcuticular suture. The epidermis was then sealed with Dermabond. The dermatotomy at the venous access site was also closed with Dermabond. The port catheter was left accessed and sterile bandages were applied. Next, the  facial mass was evaluated. The mass was interrogated with ultrasound. A suitable biopsy entry site was identified in the submental space allowing passage through relatively normal soft tissues in a region of relatively low vascularity within the mass. The skin was sterilely prepped and draped in the usual fashion using chlorhexidine  skin prep. Local anesthesia was attained by infiltration with 1% lidocaine . A small dermatotomy was made. Under real-time ultrasound guidance, multiple 18 gauge core biopsies were obtained using the Argon automated biopsy device. Biopsy specimens were placed on a moistened Telfa pad and  submitted to pathology for further analysis. Post biopsy imaging demonstrates no evidence of active bleeding or other complication. IMPRESSION: Successful placement of a right IJ approach Bard Clear Vue port catheter with ultrasound and fluoroscopic guidance. The catheter is ready for use. Successful ultrasound-guided biopsy of right facial mass. Electronically Signed   By: Wilkie Lent M.D.   On: 10/15/2023 11:35   CT SOFT TISSUE NECK W CONTRAST Result Date: 10/14/2023 CLINICAL DATA:  89 year old male with a rapidly enlarging right face mass and history of follicular lymphoma. EXAM: CT NECK WITH CONTRAST TECHNIQUE: Multidetector CT imaging of the neck was performed using the standard protocol following the bolus administration of intravenous contrast. RADIATION DOSE REDUCTION: This exam was performed according to the departmental dose-optimization program which includes automated exposure control, adjustment of the mA and/or kV according to patient size and/or use of iterative reconstruction technique. CONTRAST:  75mL OMNIPAQUE  IOHEXOL  300 MG/ML  SOLN COMPARISON:  Face/neck soft tissue ultrasound 08/27/2023. PET-CT 08/25/2023. Neck CT 04/13/2020. FINDINGS: Pharynx and larynx: Larynx and pharynx soft tissue contours, mucosal enhancement remain within normal limits. Negative parapharyngeal and retropharyngeal spaces. Salivary glands: Large, amorphous, relatively homogeneous trans spatial soft tissue mass about the right mandible body and ramus corresponding to similar appearing mass with intense FDG uptake on June PET-CT. The bulk of the mass is superficial to the mandible oil as in June. This mass with indistinct margins encompasses 95 x 44 by 124 mm (AP by transverse by CC) subcutaneous and dermis involvement. Some involvement of the right lower masticator space. Extension into the right submandibular space with mild mass effect on the right submandibular gland (series 2, image 62). Relatively spared left  sublingual space. Left anterior inferior parotid space mildly affected. Contralateral left submandibular, masticator and parotid spaces are negative. Thyroid : Negative. Lymph nodes: Large superficial right face mass described above. Associated contiguous abnormal right level 1A lymph node in the midline which is 18 mm short axis on series 2, image 73. This is new or progressed since 08/25/2023. But little if any additional cervical lymphadenopathy, right level 2 nodes remain subcentimeter. Vascular: Both internal jugular veins in the neck appear partially effaced but remain patent. Other major vascular structures in the bilateral neck and at the skull base are patent. Right greater than left carotid bifurcation atherosclerosis. Limited intracranial: Negative for age. Visualized orbits: Stable since 2022 and negative. Mastoids and visualized paranasal sinuses: Visualized paranasal sinuses and mastoids are stable and well aerated. Skeleton: Despite the bulky adjacent soft tissue mass the right mandible appears to remain intact and normally located. And no acute or suspicious osseous lesion is identified, with chronic cervical spine degeneration. Upper chest: Bilateral lung apex and subpleural mild lung scarring. No visible superior mediastinal or axillary lymphadenopathy. IMPRESSION: 1. 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 Adjacent abnormal level 1A lymph node in  the midline is new or progressed since June. 2. But no other lymphadenopathy in the Neck or at the thoracic inlet. And the underlying right mandible remains intact. Electronically Signed   By: VEAR Hurst M.D.   On: 10/14/2023 13:16    Microbiology: Results for orders placed or performed during the hospital encounter of 10/18/23  Blood culture (routine x 2)     Status: Abnormal   Collection Time: 10/18/23  2:05 AM   Specimen: BLOOD RIGHT ARM   Result Value Ref Range Status   Specimen Description   Final    BLOOD RIGHT ARM Performed at [facility], [address] [place], [place] [address]    Special Requests   Final    BOTTLES DRAWN AEROBIC AND ANAEROBIC Blood Culture results may not be optimal due to an inadequate volume of blood received in culture bottles Performed at [facility], [address] [place], [place] [address]    Culture  Setup Time   Final    GRAM POSITIVE COCCI IN CHAINS AEROBIC BOTTLE ONLY CRITICAL RESULT CALLED TO, READ BACK BY AND VERIFIED WITH: MAYA DAMIEN SQUIBB [id number] FCP Performed at [facility], [address] [place], [place] [address]    Culture GROUP B STREP(S.AGALACTIAE)ISOLATED (A)  Final   Report Status 10/21/2023 FINAL  Final   Organism ID, Bacteria GROUP B STREP(S.AGALACTIAE)ISOLATED  Final      Susceptibility   Group b strep(s.agalactiae)isolated - MIC*    CLINDAMYCIN >=1 RESISTANT Resistant     AMPICILLIN <=0.25 SENSITIVE Sensitive     ERYTHROMYCIN >=8 RESISTANT Resistant     VANCOMYCIN  0.5 SENSITIVE Sensitive     CEFTRIAXONE  <=0.12 SENSITIVE Sensitive     LEVOFLOXACIN 4 INTERMEDIATE Intermediate     PENICILLIN  <=0.06 SENSITIVE Sensitive     * GROUP B STREP(S.AGALACTIAE)ISOLATED  Blood culture (routine x 2)     Status: None   Collection Time: 10/18/23  2:05 AM   Specimen: BLOOD LEFT FOREARM  Result Value Ref Range Status   Specimen Description   Final    BLOOD LEFT FOREARM Performed at [facility], [address] [place], [place] [address]    Special Requests   Final    BOTTLES DRAWN AEROBIC AND ANAEROBIC Blood Culture results may not be optimal due to an inadequate volume of blood received in culture bottles Performed at [facility], [address], [place], [place] [address]    Culture   Final    NO GROWTH 5 DAYS Performed at [facility], [address], [place], [place] [address]    Report Status  10/23/2023 FINAL  Final  Blood Culture ID Panel (Reflexed)     Status: Abnormal   Collection Time: 10/18/23  2:05 AM  Result Value Ref Range Status   Enterococcus faecalis NOT DETECTED NOT DETECTED Final   Enterococcus Faecium NOT DETECTED NOT DETECTED Final   Listeria monocytogenes NOT DETECTED NOT DETECTED Final   Staphylococcus species NOT DETECTED NOT DETECTED Final   Staphylococcus aureus (BCID) NOT DETECTED NOT DETECTED Final   Staphylococcus epidermidis NOT DETECTED NOT DETECTED Final   Staphylococcus lugdunensis NOT DETECTED NOT DETECTED Final   Streptococcus species DETECTED (A) NOT DETECTED Final    Comment: CRITICAL RESULT CALLED TO, READ BACK BY AND VERIFIED WITH: PHARMD LEANNE P [id number] FCP    Streptococcus agalactiae DETECTED (A) NOT DETECTED Final    Comment: CRITICAL RESULT CALLED TO, READ BACK BY  AND VERIFIED WITH: PHARMD LEANNE P [id number] FCP    Streptococcus pneumoniae NOT DETECTED NOT DETECTED Final   Streptococcus pyogenes NOT DETECTED NOT DETECTED Final   A.calcoaceticus-baumannii NOT DETECTED NOT DETECTED Final   Bacteroides fragilis NOT DETECTED NOT DETECTED Final   Enterobacterales NOT DETECTED NOT DETECTED Final   Enterobacter cloacae complex NOT DETECTED NOT DETECTED Final   Escherichia coli NOT DETECTED NOT DETECTED Final   Klebsiella aerogenes NOT DETECTED NOT DETECTED Final   Klebsiella oxytoca NOT DETECTED NOT DETECTED Final   Klebsiella pneumoniae NOT DETECTED NOT DETECTED Final   Proteus species NOT DETECTED NOT DETECTED Final   Salmonella species NOT DETECTED NOT DETECTED Final   Serratia marcescens NOT DETECTED NOT DETECTED Final   Haemophilus influenzae NOT DETECTED NOT DETECTED Final   Neisseria meningitidis NOT DETECTED NOT DETECTED Final   Pseudomonas aeruginosa NOT DETECTED NOT DETECTED Final   Stenotrophomonas maltophilia NOT DETECTED NOT DETECTED Final   Candida albicans NOT DETECTED NOT DETECTED Final   Candida auris NOT DETECTED  NOT DETECTED Final   Candida glabrata NOT DETECTED NOT DETECTED Final   Candida krusei NOT DETECTED NOT DETECTED Final   Candida parapsilosis NOT DETECTED NOT DETECTED Final   Candida tropicalis NOT DETECTED NOT DETECTED Final   Cryptococcus neoformans/gattii NOT DETECTED NOT DETECTED Final    Comment: Performed at [facility], [address], [place], [place] [address]  Resp panel by RT-PCR (RSV, Flu A&B, Covid) Anterior Nasal Swab     Status: None   Collection Time: 10/18/23  2:26 AM   Specimen: Anterior Nasal Swab  Result Value Ref Range Status   SARS Coronavirus 2 by RT PCR NEGATIVE NEGATIVE Final    Comment: (NOTE) SARS-CoV-2 target nucleic acids are NOT DETECTED.  The SARS-CoV-2 RNA is generally detectable in upper respiratory specimens during the acute phase of infection. The lowest concentration of SARS-CoV-2 viral copies this assay can detect is 138 copies/mL. A negative result does not preclude SARS-Cov-2 infection and should not be used as the sole basis for treatment or other patient management decisions. A negative result may occur with  improper specimen collection/handling, submission of specimen other than nasopharyngeal swab, presence of viral mutation(s) within the areas targeted by this assay, and inadequate number of viral copies(<138 copies/mL). A negative result must be combined with clinical observations, patient history, and epidemiological information. The expected result is Negative.  Fact Sheet for Patients:  BloggerCourse.com  Fact Sheet for Healthcare Providers:  SeriousBroker.it  This test is no t yet approved or cleared by the [place]  FDA and  has been authorized for detection and/or diagnosis of SARS-CoV-2 by FDA under an Emergency Use Authorization (EUA). This EUA will remain  in effect (meaning this test can be used) for the duration of the COVID-19 declaration under Section 564(b)(1)  of the Act, 21 U.S.C.section 360bbb-3(b)(1), unless the authorization is terminated  or revoked sooner.       Influenza A by PCR NEGATIVE NEGATIVE Final   Influenza B by PCR NEGATIVE NEGATIVE Final    Comment: (NOTE) The Xpert Xpress SARS-CoV-2/FLU/RSV plus assay is intended as an aid in the diagnosis of influenza from Nasopharyngeal swab specimens and should not be used as a sole basis for treatment. Nasal washings and aspirates are unacceptable for Xpert Xpress SARS-CoV-2/FLU/RSV testing.  Fact Sheet for Patients: BloggerCourse.com  Fact Sheet for Healthcare Providers: SeriousBroker.it  This test is not yet approved or cleared by the [place]  FDA and has been  authorized for detection and/or diagnosis of SARS-CoV-2 by FDA under an Emergency Use Authorization (EUA). This EUA will remain in effect (meaning this test can be used) for the duration of the COVID-19 declaration under Section 564(b)(1) of the Act, 21 U.S.C. section 360bbb-3(b)(1), unless the authorization is terminated or revoked.     Resp Syncytial Virus by PCR NEGATIVE NEGATIVE Final    Comment: (NOTE) Fact Sheet for Patients: BloggerCourse.com  Fact Sheet for Healthcare Providers: SeriousBroker.it  This test is not yet approved or cleared by the [place]  FDA and has been authorized for detection and/or diagnosis of SARS-CoV-2 by FDA under an Emergency Use Authorization (EUA). This EUA will remain in effect (meaning this test can be used) for the duration of the COVID-19 declaration under Section 564(b)(1) of the Act, 21 U.S.C. section 360bbb-3(b)(1), unless the authorization is terminated or revoked.  Performed at [facility], [address] [place], [place] [address]   Urine Culture     Status: None   Collection Time: 10/18/23  6:16 AM   Specimen: Urine, Clean Catch  Result  Value Ref Range Status   Specimen Description   Final    URINE, CLEAN CATCH Performed at [facility], [address] [place], [place] [address]    Special Requests   Final    NONE Performed at [facility], [address], [place], [place] [address]    Culture   Final    NO GROWTH Performed at [facility], [address] [place], [place] [address]    Report Status 10/19/2023 FINAL  Final  MRSA Next Gen by PCR, Nasal     Status: None   Collection Time: 10/18/23  9:19 AM   Specimen: Nasal Mucosa; Nasal Swab  Result Value Ref Range Status   MRSA by PCR Next Gen NOT DETECTED NOT DETECTED Final    Comment: (NOTE) The GeneXpert MRSA Assay (FDA approved for NASAL specimens only), is one component of a comprehensive MRSA colonization surveillance program. It is not intended to diagnose MRSA infection nor to guide or monitor treatment for MRSA infections. Test performance is not FDA approved in patients less than 58 years old. Performed at [facility], [address], [place], [place] [address]   Culture, blood (Routine X 2) w Reflex to ID Panel     Status: None   Collection Time: 10/19/23  2:42 AM   Specimen: BLOOD LEFT HAND  Result Value Ref Range Status   Specimen Description BLOOD LEFT HAND  Final   Special Requests   Final    BOTTLES DRAWN AEROBIC ONLY Blood Culture results may not be optimal due to an inadequate volume of blood received in culture bottles   Culture   Final    NO GROWTH 5 DAYS Performed at [facility], [address] [place], [place] [address]    Report Status 10/24/2023 FINAL  Final  Culture, blood (Routine X 2) w Reflex to ID Panel     Status: None   Collection Time: 10/19/23  2:42 AM   Specimen: BLOOD LEFT ARM  Result Value Ref Range Status   Specimen Description BLOOD LEFT ARM  Final   Special Requests   Final    BOTTLES DRAWN AEROBIC ONLY Blood Culture results may not be optimal  due to an inadequate volume of blood received in culture bottles   Culture   Final    NO GROWTH 5 DAYS Performed at  [facility], [address], [place], [place] [address]    Report Status 10/24/2023 FINAL  Final    Labs: CBC: Recent Labs  Lab 10/21/23 0752 10/22/23 0504 10/23/23 0500 10/24/23 0818 10/25/23 0305  WBC 5.5 6.4 10.2 10.4 7.6  HGB 10.2* 9.9* 10.3* 9.0* 8.5*  HCT 33.6* 32.2* 32.3* 28.7* 27.4*  MCV 90.3 88.5 87.8 87.5 88.4  PLT 180 192 205 181 176   Basic Metabolic Panel: Recent Labs  Lab 10/19/23 0242 10/19/23 1606 10/20/23 0552 10/21/23 0752 10/22/23 0504 10/23/23 0500 10/24/23 0818 10/25/23 0305  NA 139 138 139 137 133* 136 136 139  K 3.2* 3.7 3.6 3.8 3.6 3.6 3.9 4.1  CL 108 104 104 101 97* 98 101 104  CO2 23 27 25 25 28 28 24 23   GLUCOSE 122* 98 88 86 90 103* 127* 133*  BUN 27* 22 19 17 18 18  24* 28*  CREATININE 0.85 0.94 0.81 0.84 0.74 0.75 0.75 0.81  CALCIUM 8.2* 8.3* 7.9* 8.2* 7.9* 8.3* 8.5* 8.5*  MG 1.6* 2.4 2.3 2.1 2.0  --   --   --   PHOS 1.7* 2.3* 1.9*  --  2.3* 2.8  --   --    Liver Function Tests: Recent Labs  Lab 10/23/23 0500 10/24/23 0818 10/25/23 0305  AST 17 15 16   ALT 11 9 11   ALKPHOS 45 37* 36*  BILITOT 0.6 0.6 0.6  PROT 5.2* 5.2* 5.2*  ALBUMIN 2.6* 2.3* 2.2*   CBG: No results for input(s): GLUCAP in the last 168 hours.  Discharge time spent: less than 30 minutes.  Signed: Garnette Pelt, MD [facility] 10/25/2023

## 2023-10-25 NOTE — Progress Notes (Signed)
 TOC meds in a secure bag delivered to pt in room by this RN. family present. Pt and family had questions regarding Eliquis  at home. Primary nurse updated by this RN & will contact Dr. Cindy

## 2023-10-25 NOTE — Progress Notes (Signed)
 PHARMACY - ANTICOAGULATION CONSULT NOTE  Pharmacy Consult for Heparin  Indication: DVT  No Known Allergies  Patient Measurements: Height: 5' 6 (167.6 cm) Weight: 65.6 kg (144 lb 10 oz) IBW/kg (Calculated) : 63.8 HEPARIN  DW (KG): 64  Vital Signs: Temp: 97.6 F (36.4 C) (08/09 0400) Temp Source: Oral (08/09 0400) BP: 107/43 (08/09 0400) Pulse Rate: 71 (08/09 0400)  Labs: Recent Labs    10/23/23 0500 10/24/23 0818 10/25/23 0305 10/25/23 0306  HGB 10.3* 9.0* 8.5*  --   HCT 32.3* 28.7* 27.4*  --   PLT 205 181 176  --   HEPARINUNFRC 0.44 0.34  --  0.29*  CREATININE 0.75 0.75 0.81  --     Estimated Creatinine Clearance: 59.1 mL/min (by C-G formula based on SCr of 0.81 mg/dL).  Assessment: Pharmacy consulted to manage IV heparin  for ACS/STEMI.  Evaluated by cardiology for chest pain and increasingly elevated troponins - no further chest pain and no plans for cardiac catheterization. Dopplers + age indeterminate LLE DVT.  He was previously on apixaban  for DVT, but last filled 03/09/2023 and no longer taking outpatient.    Today, 10/25/2023:  Heparin  level  0.29 - slightly subtherapeutic on heparin  1600 units/hr Hgb 8.5--slightly low and down-trending Plts 176--WNL but down-trending IR removed PAC 8/7 due to hematoma within port pocket, EBL < 5 ml No active bleeding or complications noted  Goal of Therapy:  Heparin  level 0.3-0.7 units/ml Monitor platelets by anticoagulation protocol: Yes   Plan:  - Slightly increase heparin  drip to 1650 units/hr   - Check 8hr confirmatory heparin  level - Monitor daily heparin  level, CBC, signs/symptoms of bleeding - Possibly transition to DOAC today, per 8/8 MD note   Lacinda Moats, PharmD Clinical Pharmacist  8/9/20255:12 AM

## 2023-10-25 NOTE — Evaluation (Addendum)
 Occupational Therapy Evaluation Patient Details Name: Harold [name] MRN: [id number] DOB: 1934-07-30 Today's Date: 10/25/2023   History of Present Illness   Harold [name] is an 89 year old male admitted with dx of Septic shock secondary to group B Streptococcus bacteremia, R LE extremity cellulitis PMH: cervical/right facial mass, HTN, HLD, CAD s/p stents remotely, chronic diastolic CHF, DVT no longer on Xarelto     Clinical Impressions PTA, patient lives alone with daughter and SIL in home beside his property. Patient reports mowing his grass last week and using rollator for ambulation, DME for showers but able to drive locally and prepare meals. Family assist with groceries and as needed intermittently. Currently, patient presents with deficits outlined below (see OT Problem List for details) most significantly LE edema, decreased higher level activity tolerance, postural deficits and decreased balance impacting BADL's and functional mobility but with rolllator this session, able to amb household distances with support for IV pole and lines/leads with supervision. Patient requires continued Acute care hospital level OT services to progress safety and functional performance and allow for discharge. No follow up OT recommended with family support and intermittent assistance.       If plan is discharge home, recommend the following:   A little help with walking and/or transfers;A little help with bathing/dressing/bathroom;Assist for transportation;Help with stairs or ramp for entrance     Functional Status Assessment   Patient has had a recent decline in their functional status and demonstrates the ability to make significant improvements in function in a reasonable and predictable amount of time.     Equipment Recommendations   None recommended by OT      Precautions/Restrictions   Precautions Precautions: Fall Restrictions Weight Bearing Restrictions Per Provider Order: No      Mobility Bed Mobility Overal bed mobility: Modified Independent             General bed mobility comments: with HOB elevated, sleeps in lift chair at home    Transfers Overall transfer level: Needs assistance Equipment used: Rollator (4 wheels) Transfers: Sit to/from Stand, Bed to chair/wheelchair/BSC Sit to Stand: Supervision     Step pivot transfers: Supervision     General transfer comment: min cues for hand placement and pacing, assist for IV lines and leads      Balance Overall balance assessment: Mild deficits observed, not formally tested (most significantly based on posture with compensation for forward body weight shift over rollator)                                         ADL either performed or assessed with clinical judgement   ADL Overall ADL's : Needs assistance/impaired Eating/Feeding: Independent   Grooming: Sitting;Independent   Upper Body Bathing: Modified independent;Sitting   Lower Body Bathing: Minimal assistance;Sit to/from stand   Upper Body Dressing : Modified independent;Sitting   Lower Body Dressing: Minimal assistance;Sit to/from stand Lower Body Dressing Details (indicate cue type and reason): due to reach to feet deficit, has reacher at home and Velcro slippers Toilet Transfer: Supervision/safety;Rollator (4 wheels)           Functional mobility during ADLs: Supervision/safety;Rollator (4 wheels) (assist for IV pole and leads) General ADL Comments: no SOB noted, LE edema with pillow props and elevation recommended     Vision Baseline Vision/History: 1 Wears glasses;0 No visual deficits Ability to See in Adequate Light: 0 Adequate  Vision Assessment?: No apparent visual deficits;Wears glasses for reading;Wears glasses for driving            Pertinent Vitals/Pain Pain Assessment Pain Assessment: No/denies pain     Extremity/Trunk Assessment Upper Extremity Assessment Upper Extremity Assessment: Right  hand dominant;Overall [facility] for tasks assessed   Lower Extremity Assessment Lower Extremity Assessment: Defer to PT evaluation   Cervical / Trunk Assessment Cervical / Trunk Assessment: Kyphotic;Other exceptions (significant forward head/posture in all positions)   Communication Communication Communication: Impaired Factors Affecting Communication: Hearing impaired   Cognition Arousal: Alert Behavior During Therapy: WFL for tasks assessed/performed Cognition: No apparent impairments                               Following commands: Intact       Cueing  General Comments   Cueing Techniques: Verbal cues  moderate +2 B LE edema, several sacral pads on buttocks and spine for protection of bony promininences, no SOB with 100% SpO2 post amb in hallway           Home Living Family/patient expects to be discharged to:: Private residence Living Arrangements: Alone Available Help at Discharge: Family Type of Home: House Home Access: Stairs to enter Secretary/administrator of Steps: 2-3 Entrance Stairs-Rails: Left Home Layout: One level     Bathroom Shower/Tub: Walk-in shower;Door   [employer] Toilet: Handicapped height Bathroom Accessibility: Yes How Accessible: Accessible via walker Home Equipment: Rollator (4 wheels);Shower seat;Lift chair          Prior Functioning/Environment Prior Level of Function : Independent/Modified Independent             Mobility Comments: was mowing grass last week; indep and uses rollator ADLs Comments: drives short distances, cooks, dtr assists to obtain groceries    OT Problem List: Impaired balance (sitting and/or standing);Increased edema   OT Treatment/Interventions: Self-care/ADL training;Neuromuscular education;Therapeutic exercise;Energy conservation;DME and/or AE instruction;Therapeutic activities;Patient/family education;Balance training      OT Goals(Current goals can be found in the care plan section)    Acute Rehab OT Goals Patient Stated Goal: to go home soon OT Goal Formulation: With patient/family Time For Goal Achievement: 11/08/23 Potential to Achieve Goals: Good ADL Goals Pt Will Perform Lower Body Dressing: with modified independence;sit to/from stand;with adaptive equipment Pt Will Transfer to Toilet: with modified independence;ambulating;regular height toilet Pt Will Perform Toileting - Clothing Manipulation and hygiene: with modified independence;sit to/from stand Pt Will Perform Tub/Shower Transfer: with modified independence;ambulating;shower seat;Shower transfer Additional ADL Goal #1: Patient will teach back 5 P's of ECT indep with ADL's and mobility   OT Frequency:  Min 2X/week    Co-evaluation PT/OT/SLP Co-Evaluation/Treatment: Yes Reason for Co-Treatment: For patient/therapist safety PT goals addressed during session: Mobility/safety with mobility;Balance;Proper use of DME OT goals addressed during session: ADL's and self-care;Proper use of Adaptive equipment and DME      AM-PAC OT 6 Clicks Daily Activity     Outcome Measure Help from another person eating meals?: None Help from another person taking care of personal grooming?: None Help from another person toileting, which includes using toliet, bedpan, or urinal?: A Little Help from another person bathing (including washing, rinsing, drying)?: A Little Help from another person to put on and taking off regular upper body clothing?: None Help from another person to put on and taking off regular lower body clothing?: A Little 6 Click Score: 21   End of Session Equipment Utilized During Treatment:  Gait belt;Rollator (4 wheels) Nurse Communication: Mobility status  Activity Tolerance: Patient tolerated treatment well Patient left: in bed;with call bell/phone within reach;with bed alarm set;with family/visitor present  OT Visit Diagnosis: Unsteadiness on feet (R26.81)                Time: 8669-8644 OT Time  Calculation (min): 25 min Charges:  OT General Charges $OT Visit: 1 Visit OT Evaluation $OT Eval Low Complexity: 1 Low  Miracle Mongillo OT/L Acute Rehabilitation Department  [phone]  10/25/2023, 2:18 PM

## 2023-10-25 NOTE — Progress Notes (Signed)
 PHARMACY - ANTICOAGULATION CONSULT NOTE  Pharmacy Consult for Heparin  >> Eliquis  Indication: DVT  No Known Allergies  Patient Measurements: Height: 5' 6 (167.6 cm) Weight: 65.6 kg (144 lb 10 oz) IBW/kg (Calculated) : 63.8 HEPARIN  DW (KG): 64  Vital Signs: Temp: 97.6 F (36.4 C) (08/09 0400) Temp Source: Oral (08/09 0400) BP: 111/47 (08/09 0912) Pulse Rate: 71 (08/09 0911)  Labs: Recent Labs    10/23/23 0500 10/24/23 0818 10/25/23 0305 10/25/23 0306  HGB 10.3* 9.0* 8.5*  --   HCT 32.3* 28.7* 27.4*  --   PLT 205 181 176  --   HEPARINUNFRC 0.44 0.34  --  0.29*  CREATININE 0.75 0.75 0.81  --     Estimated Creatinine Clearance: 59.1 mL/min (by C-G formula based on SCr of 0.81 mg/dL).  Assessment: Pharmacy consulted to manage IV heparin  for ACS/STEMI.  Evaluated by cardiology for chest pain and increasingly elevated troponins - no further chest pain and no plans for cardiac catheterization. Dopplers + age indeterminate LLE DVT.  He was previously on apixaban  for DVT, but last filled 03/09/2023 and no longer taking outpatient.    Today, 10/25/2023:  Heparin  level  0.29 - slightly subtherapeutic on heparin  1600 units/hr Hgb 8.5--slightly low and down-trending Plts 176--WNL but down-trending IR removed PAC 8/7 due to hematoma within port pocket, EBL < 5 ml No active bleeding or complications noted To transition to Eliquis  On day# 8 of heparin  so will be able to skip the 10 mg bid x 7 day part of Eliquis  treatment regimen & start with Eliquis  5 mg po bid  Goal of Therapy:  Heparin  level 0.3-0.7 units/ml Monitor platelets by anticoagulation protocol: Yes   Plan:  DC heparin  drip Start Eliquis  5 mg po bid (on day #8 of IV heparin  so will not need 10 mg bid x 7 days for DVT) DC heparin  levels Continue CBC or per MD  Rosaline IVAR Edison, Pharm.D Use secure chat for questions 10/25/2023 10:15 AM

## 2023-10-26 ENCOUNTER — Other Ambulatory Visit: Payer: Self-pay | Admitting: Hematology

## 2023-10-27 ENCOUNTER — Encounter: Payer: Self-pay | Admitting: Hematology

## 2023-10-27 ENCOUNTER — Other Ambulatory Visit: Payer: Self-pay | Admitting: Hematology

## 2023-10-27 ENCOUNTER — Other Ambulatory Visit

## 2023-10-27 DIAGNOSIS — C8338 Diffuse large B-cell lymphoma, lymph nodes of multiple sites: Secondary | ICD-10-CM

## 2023-10-27 DIAGNOSIS — C8218 Follicular lymphoma grade II, lymph nodes of multiple sites: Secondary | ICD-10-CM

## 2023-10-28 ENCOUNTER — Other Ambulatory Visit: Payer: Self-pay | Admitting: Hematology

## 2023-10-28 ENCOUNTER — Inpatient Hospital Stay

## 2023-10-28 ENCOUNTER — Inpatient Hospital Stay: Attending: Hematology

## 2023-10-28 VITALS — BP 111/55 | HR 66 | Resp 16

## 2023-10-28 DIAGNOSIS — Z86718 Personal history of other venous thrombosis and embolism: Secondary | ICD-10-CM | POA: Insufficient documentation

## 2023-10-28 DIAGNOSIS — C8218 Follicular lymphoma grade II, lymph nodes of multiple sites: Secondary | ICD-10-CM

## 2023-10-28 DIAGNOSIS — Z5111 Encounter for antineoplastic chemotherapy: Secondary | ICD-10-CM | POA: Diagnosis present

## 2023-10-28 DIAGNOSIS — Z79899 Other long term (current) drug therapy: Secondary | ICD-10-CM | POA: Insufficient documentation

## 2023-10-28 DIAGNOSIS — Z7901 Long term (current) use of anticoagulants: Secondary | ICD-10-CM | POA: Insufficient documentation

## 2023-10-28 DIAGNOSIS — N201 Calculus of ureter: Secondary | ICD-10-CM | POA: Insufficient documentation

## 2023-10-28 DIAGNOSIS — Z85828 Personal history of other malignant neoplasm of skin: Secondary | ICD-10-CM | POA: Diagnosis not present

## 2023-10-28 DIAGNOSIS — C8338 Diffuse large B-cell lymphoma, lymph nodes of multiple sites: Secondary | ICD-10-CM

## 2023-10-28 DIAGNOSIS — C8290 Follicular lymphoma, unspecified, unspecified site: Secondary | ICD-10-CM | POA: Insufficient documentation

## 2023-10-28 LAB — CBC WITH DIFFERENTIAL (CANCER CENTER ONLY)
Abs Immature Granulocytes: 0.07 K/uL (ref 0.00–0.07)
Basophils Absolute: 0 K/uL (ref 0.0–0.1)
Basophils Relative: 0 %
Eosinophils Absolute: 0 K/uL (ref 0.0–0.5)
Eosinophils Relative: 0 %
HCT: 30.3 % — ABNORMAL LOW (ref 39.0–52.0)
Hemoglobin: 9.8 g/dL — ABNORMAL LOW (ref 13.0–17.0)
Immature Granulocytes: 1 %
Lymphocytes Relative: 3 %
Lymphs Abs: 0.2 K/uL — ABNORMAL LOW (ref 0.7–4.0)
MCH: 27.8 pg (ref 26.0–34.0)
MCHC: 32.3 g/dL (ref 30.0–36.0)
MCV: 86.1 fL (ref 80.0–100.0)
Monocytes Absolute: 0 K/uL — ABNORMAL LOW (ref 0.1–1.0)
Monocytes Relative: 0 %
Neutro Abs: 9.2 K/uL — ABNORMAL HIGH (ref 1.7–7.7)
Neutrophils Relative %: 96 %
Platelet Count: 300 K/uL (ref 150–400)
RBC: 3.52 MIL/uL — ABNORMAL LOW (ref 4.22–5.81)
RDW: 14.9 % (ref 11.5–15.5)
WBC Count: 9.5 K/uL (ref 4.0–10.5)
nRBC: 0 % (ref 0.0–0.2)

## 2023-10-28 LAB — CMP (CANCER CENTER ONLY)
ALT: 12 U/L (ref 0–44)
AST: 10 U/L — ABNORMAL LOW (ref 15–41)
Albumin: 3.7 g/dL (ref 3.5–5.0)
Alkaline Phosphatase: 44 U/L (ref 38–126)
Anion gap: 7 (ref 5–15)
BUN: 37 mg/dL — ABNORMAL HIGH (ref 8–23)
CO2: 33 mmol/L — ABNORMAL HIGH (ref 22–32)
Calcium: 9.3 mg/dL (ref 8.9–10.3)
Chloride: 103 mmol/L (ref 98–111)
Creatinine: 0.81 mg/dL (ref 0.61–1.24)
GFR, Estimated: >60 mL/min (ref >60)
Glucose, Bld: 120 mg/dL — ABNORMAL HIGH (ref 70–99)
Potassium: 4.3 mmol/L (ref 3.5–5.1)
Sodium: 143 mmol/L (ref 135–145)
Total Bilirubin: 0.7 mg/dL (ref 0.0–1.2)
Total Protein: 6.1 g/dL — ABNORMAL LOW (ref 6.5–8.1)

## 2023-10-28 MED ORDER — PEGFILGRASTIM INJECTION 6 MG/0.6ML ~~LOC~~
6.0000 mg | PREFILLED_SYRINGE | Freq: Once | SUBCUTANEOUS | Status: AC
  Administered 2023-10-28 (×2): 6 mg via SUBCUTANEOUS
  Filled 2023-10-28: qty 0.6

## 2023-10-28 NOTE — Telephone Encounter (Signed)
 Spoke with patient daughter. She stated she will cal back to schedule patient HFU due to patient has a lot of upcoming appts

## 2023-10-31 ENCOUNTER — Inpatient Hospital Stay

## 2023-10-31 ENCOUNTER — Encounter: Payer: Self-pay | Admitting: Hematology

## 2023-10-31 ENCOUNTER — Other Ambulatory Visit: Payer: Self-pay

## 2023-10-31 ENCOUNTER — Other Ambulatory Visit: Payer: Self-pay | Admitting: Hematology

## 2023-10-31 VITALS — BP 107/50 | HR 66 | Temp 97.7°F | Resp 16 | Wt 154.0 lb

## 2023-10-31 DIAGNOSIS — C8218 Follicular lymphoma grade II, lymph nodes of multiple sites: Secondary | ICD-10-CM

## 2023-10-31 DIAGNOSIS — C8338 Diffuse large B-cell lymphoma, lymph nodes of multiple sites: Secondary | ICD-10-CM

## 2023-10-31 DIAGNOSIS — Z5111 Encounter for antineoplastic chemotherapy: Secondary | ICD-10-CM | POA: Diagnosis not present

## 2023-10-31 MED ORDER — SODIUM CHLORIDE 0.9 % IV SOLN
375.0000 mg/m2 | Freq: Once | INTRAVENOUS | Status: AC
  Administered 2023-10-31: 700 mg via INTRAVENOUS
  Filled 2023-10-31: qty 50

## 2023-10-31 MED ORDER — DIPHENHYDRAMINE HCL 25 MG PO CAPS
50.0000 mg | ORAL_CAPSULE | Freq: Once | ORAL | Status: AC
  Administered 2023-10-31: 50 mg via ORAL
  Filled 2023-10-31: qty 2

## 2023-10-31 MED ORDER — FAMOTIDINE IN NACL 20-0.9 MG/50ML-% IV SOLN
20.0000 mg | Freq: Once | INTRAVENOUS | Status: AC
  Administered 2023-10-31: 20 mg via INTRAVENOUS
  Filled 2023-10-31: qty 50

## 2023-10-31 MED ORDER — ACETAMINOPHEN 325 MG PO TABS
650.0000 mg | ORAL_TABLET | Freq: Once | ORAL | Status: AC
  Administered 2023-10-31: 650 mg via ORAL
  Filled 2023-10-31: qty 2

## 2023-10-31 MED ORDER — DEXAMETHASONE SODIUM PHOSPHATE 10 MG/ML IJ SOLN
10.0000 mg | Freq: Once | INTRAMUSCULAR | Status: AC
  Administered 2023-10-31: 10 mg via INTRAVENOUS
  Filled 2023-10-31: qty 1

## 2023-10-31 MED ORDER — SODIUM CHLORIDE 0.9 % IV SOLN: INTRAVENOUS | Status: DC

## 2023-10-31 NOTE — Progress Notes (Signed)
 Daughter, [name] was very concerned about recent hospitalization/CHF sypmtoms- primarily leg swelling- asked this RN to speak with Dr. [name]. Discussed concerning symptoms with Dr. [name] who stated to proceed with treatment today.  Per Dr. [name]- no prednisone  post treatment due to swelling issues. Daughter and patient aware.

## 2023-10-31 NOTE — Patient Instructions (Signed)
 [facility] - A DEPT OF [place]  Discharge Instructions: Thank you for choosing [facility] to provide your oncology and hematology care.   If you have a lab appointment with the Cancer Center, please go directly to the Cancer Center and check in at the registration area.   Wear comfortable clothing and clothing appropriate for easy access to any Portacath or PICC line.   We strive to give you quality time with your provider. You may need to reschedule your appointment if you arrive late (15 or more minutes).  Arriving late affects you and other patients whose appointments are after yours.  Also, if you miss three or more appointments without notifying the office, you may be dismissed from the clinic at the provider's discretion.      For prescription refill requests, have your pharmacy contact our office and allow 72 hours for refills to be completed.    Today you received the following chemotherapy and/or immunotherapy agents Rituxan     To help prevent nausea and vomiting after your treatment, we encourage you to take your nausea medication as directed.  BELOW ARE SYMPTOMS THAT SHOULD BE REPORTED IMMEDIATELY: *FEVER GREATER THAN 100.4 F (38 C) OR HIGHER *CHILLS OR SWEATING *NAUSEA AND VOMITING THAT IS NOT CONTROLLED WITH YOUR NAUSEA MEDICATION *UNUSUAL SHORTNESS OF BREATH *UNUSUAL BRUISING OR BLEEDING *URINARY PROBLEMS (pain or burning when urinating, or frequent urination) *BOWEL PROBLEMS (unusual diarrhea, constipation, pain near the anus) TENDERNESS IN MOUTH AND THROAT WITH OR WITHOUT PRESENCE OF ULCERS (sore throat, sores in mouth, or a toothache) UNUSUAL RASH, SWELLING OR PAIN  UNUSUAL VAGINAL DISCHARGE OR ITCHING   Items with * indicate a potential emergency and should be followed up as soon as possible or go to the Emergency Department if any problems should occur.  Please show the CHEMOTHERAPY ALERT CARD or IMMUNOTHERAPY ALERT  CARD at check-in to the Emergency Department and triage nurse.  Should you have questions after your visit or need to cancel or reschedule your appointment, please contact [facility] - A DEPT OF [facility]  Dept: [phone]  and follow the prompts.  Office hours are 8:00 a.m. to 4:30 p.m. Monday - Friday. Please note that voicemails left after 4:00 p.m. may not be returned until the following business day.  We are closed weekends and major holidays. You have access to a nurse at all times for urgent questions. Please call the main number to the clinic Dept: [phone] and follow the prompts.   For any non-urgent questions, you may also contact your provider using MyChart. We now offer e-Visits for anyone 47 and older to request care online for non-urgent symptoms. For details visit [email or web address].   Also download the MyChart app! Go to the app store, search "MyChart", open the app, select [place], and log in with your MyChart username and password.

## 2023-11-03 ENCOUNTER — Encounter: Payer: Self-pay | Admitting: Hematology

## 2023-11-03 NOTE — Progress Notes (Signed)
 Cardiology Office Note:  .   Date:  11/11/2023  ID:  Harold [name], DOB January 22, 1935, MRN [id number] PCP: [name], MD  [facility] Providers Cardiologist:  Lonni Cash, MD    History of Present Illness: .   Harold [name] is a 89 y.o. 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  LHC from 01/2023 showed: severe mid LAD stenosis s/p lithotripsy, mild LCX stenosis, mild-moderate RCA stenosis  There are no plans for invasive cardiac evaluation Patient also had an elevated BNP and was diuresed, DVT LLE now on eliquis . Follicular lymphoma with concern for high grade lymphoma with rapidly enlarging right neck mass undergoing chemo.   Patient here for f/u. Going through chemo for right neck mass.Is going to need radiation. No chest pain, dyspnea, palpitations. Chronic LE edema-doesn't take lasix  if he's going out.  ROS:    Studies Reviewed: SABRA         Prior CV Studies:   Echo 10/2023 IMPRESSIONS     1. No new RWMA;s since TTE done 10/15/23. Left ventricular ejection  fraction, by estimation, is 60 to 65%. The left ventricle has normal  function. The left ventricle has no regional wall motion abnormalities.  Left ventricular diastolic parameters are  consistent with Grade II diastolic dysfunction (pseudonormalization).  Elevated left ventricular end-diastolic pressure. The average left  ventricular global longitudinal strain is -19.9 %. The global longitudinal  strain is normal.   2. Right ventricular systolic function is normal. The right ventricular  size is normal.   3. Right atrial size was mildly dilated.   4. The mitral valve is degenerative. Trivial mitral valve regurgitation.  Mild mitral stenosis.  Moderate mitral annular calcification.   5. Tricuspid valve regurgitation is moderate.   6. The aortic valve is tricuspid. There is moderate calcification of the  aortic valve. There is moderate thickening of the aortic valve. Aortic  valve regurgitation is not visualized. Mild to moderate aortic valve  stenosis.   7. The inferior vena cava is normal in size with greater than 50%  respiratory variability, suggesting right atrial pressure of 3 mmHg.   Cath 01/2023 Ost RCA to Prox RCA lesion is 60% stenosed.   Mid RCA lesion is 30% stenosed.   Prox Cx lesion is 40% stenosed.   Mid LAD-2 lesion is 30% stenosed.   Mid LAD-1 lesion is 80% stenosed.   Dist LAD-2 lesion is 100% stenosed.   Dist LAD-1 lesion is 50% stenosed.   Balloon angioplasty was performed using a BALL SAPPHIRE NC24 3.0X12.   Post intervention, there is a 20% residual stenosis.   Post intervention, there is a 20% residual stenosis.   Severe mid LAD stenosis just before the previously placed stent. This lesion is heavily calcified. The stenosis extends into the proximal edge of the stent. The distal LAD is chronically occluded 2. Successful intracoronary lithotripsy of the heavily calcified mid LAD stenosis. I could not fully expand this lesion despite treatment with lithotripsy and non-compliant balloon inflation. Orbital atherectomy was not a good option given the proximity to the previously placed mid LAD stent. I did not place a stent since the lesion was not fully expanded.  3. The Circumflex has mild proximal stenosis 4. The RCA is a  large dominant vessel with moderate ostial/proximal stenosis, mild mid vessel stenosis.    Recommendations: I would continue ASA/Plavix  for now but would stop ASA once his Eliquis  has been restarted. Will restart IV heparin  tonight.        Risk Assessment/Calculations:             Physical Exam:   VS:  BP (!) 126/40   Pulse 71   Ht 5' 6 (1.676 m)   Wt 165 lb (74.8 kg)   SpO2 99%    BMI 26.63 kg/m    Orhtostatics: No data found. Wt Readings from Last 3 Encounters:  11/11/23 165 lb (74.8 kg)  10/31/23 154 lb (69.9 kg)  10/24/23 144 lb 10 oz (65.6 kg)    GEN: Well nourished, well developed in no acute distress, large mass on right mandible NECK: No JVD; No carotid bruits CARDIAC:  RRR, no murmurs, rubs, gallops RESPIRATORY:  Clear to auscultation without rales, wheezing or rhonchi  ABDOMEN: Soft, non-tender, non-distended EXTREMITIES:  No edema; No deformity   ASSESSMENT AND PLAN: .    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       Dispo: f/u in 4 months Dr. Verlin  Signed, Olivia Pavy, PA-C

## 2023-11-04 NOTE — Progress Notes (Signed)
 Ok to proceed with full R-CEOP regimen on 8/25 per Dr. [name]. He is aware that tx is scheduled a few days early.  Derrien Anschutz, PharmD, MBA

## 2023-11-05 ENCOUNTER — Other Ambulatory Visit: Payer: Self-pay

## 2023-11-05 DIAGNOSIS — C8338 Diffuse large B-cell lymphoma, lymph nodes of multiple sites: Secondary | ICD-10-CM

## 2023-11-09 ENCOUNTER — Encounter: Payer: Self-pay | Admitting: Hematology

## 2023-11-10 ENCOUNTER — Inpatient Hospital Stay

## 2023-11-10 ENCOUNTER — Other Ambulatory Visit

## 2023-11-10 ENCOUNTER — Other Ambulatory Visit: Payer: Self-pay | Admitting: Hematology

## 2023-11-10 ENCOUNTER — Other Ambulatory Visit: Payer: Self-pay

## 2023-11-10 ENCOUNTER — Encounter: Payer: Self-pay | Admitting: Hematology

## 2023-11-10 ENCOUNTER — Ambulatory Visit (HOSPITAL_COMMUNITY)
Admission: RE | Admit: 2023-11-10 | Discharge: 2023-11-10 | Disposition: A | Discharge status: Home / Self Care | Source: Ambulatory Visit | Attending: Hematology

## 2023-11-10 ENCOUNTER — Inpatient Hospital Stay: Admitting: Hematology

## 2023-11-10 VITALS — BP 94/47 | HR 66 | Temp 97.6°F | Resp 16

## 2023-11-10 DIAGNOSIS — C8338 Diffuse large B-cell lymphoma, lymph nodes of multiple sites: Secondary | ICD-10-CM | POA: Insufficient documentation

## 2023-11-10 DIAGNOSIS — L03221 Cellulitis of neck: Secondary | ICD-10-CM | POA: Diagnosis not present

## 2023-11-10 DIAGNOSIS — C8218 Follicular lymphoma grade II, lymph nodes of multiple sites: Secondary | ICD-10-CM

## 2023-11-10 DIAGNOSIS — Z5111 Encounter for antineoplastic chemotherapy: Secondary | ICD-10-CM

## 2023-11-10 LAB — CMP (CANCER CENTER ONLY)
ALT: 8 U/L (ref 0–44)
AST: 13 U/L — ABNORMAL LOW (ref 15–41)
Albumin: 3.6 g/dL (ref 3.5–5.0)
Alkaline Phosphatase: 81 U/L (ref 38–126)
Anion gap: 7 (ref 5–15)
BUN: 17 mg/dL (ref 8–23)
CO2: 31 mmol/L (ref 22–32)
Calcium: 8.7 mg/dL — ABNORMAL LOW (ref 8.9–10.3)
Chloride: 105 mmol/L (ref 98–111)
Creatinine: 0.73 mg/dL (ref 0.61–1.24)
GFR, Estimated: >60 mL/min (ref >60)
Glucose, Bld: 79 mg/dL (ref 70–99)
Potassium: 4 mmol/L (ref 3.5–5.1)
Sodium: 143 mmol/L (ref 135–145)
Total Bilirubin: 0.4 mg/dL (ref 0.0–1.2)
Total Protein: 5.6 g/dL — ABNORMAL LOW (ref 6.5–8.1)

## 2023-11-10 LAB — CBC WITH DIFFERENTIAL (CANCER CENTER ONLY)
Abs Immature Granulocytes: 0.67 K/uL — ABNORMAL HIGH (ref 0.00–0.07)
Basophils Absolute: 0.2 K/uL — ABNORMAL HIGH (ref 0.0–0.1)
Basophils Relative: 1 %
Eosinophils Absolute: 0.1 K/uL (ref 0.0–0.5)
Eosinophils Relative: 1 %
HCT: 30.9 % — ABNORMAL LOW (ref 39.0–52.0)
Hemoglobin: 9.8 g/dL — ABNORMAL LOW (ref 13.0–17.0)
Immature Granulocytes: 6 %
Lymphocytes Relative: 12 %
Lymphs Abs: 1.5 K/uL (ref 0.7–4.0)
MCH: 27.8 pg (ref 26.0–34.0)
MCHC: 31.7 g/dL (ref 30.0–36.0)
MCV: 87.8 fL (ref 80.0–100.0)
Monocytes Absolute: 0.9 K/uL (ref 0.1–1.0)
Monocytes Relative: 7 %
Neutro Abs: 8.9 K/uL — ABNORMAL HIGH (ref 1.7–7.7)
Neutrophils Relative %: 73 %
Platelet Count: 235 K/uL (ref 150–400)
RBC: 3.52 MIL/uL — ABNORMAL LOW (ref 4.22–5.81)
RDW: 16.4 % — ABNORMAL HIGH (ref 11.5–15.5)
WBC Count: 12.2 K/uL — ABNORMAL HIGH (ref 4.0–10.5)
nRBC: 0 % (ref 0.0–0.2)

## 2023-11-10 MED ORDER — ACETAMINOPHEN 325 MG PO TABS
650.0000 mg | ORAL_TABLET | Freq: Once | ORAL | Status: AC
  Administered 2023-11-10: 650 mg via ORAL
  Filled 2023-11-10: qty 2

## 2023-11-10 MED ORDER — FAMOTIDINE IN NACL 20-0.9 MG/50ML-% IV SOLN
20.0000 mg | Freq: Once | INTRAVENOUS | Status: AC
  Administered 2023-11-10: 20 mg via INTRAVENOUS
  Filled 2023-11-10: qty 50

## 2023-11-10 MED ORDER — LIDOCAINE HCL 1 % IJ SOLN
INTRAMUSCULAR | Status: AC
Start: 2023-11-10 — End: 2023-11-10
  Filled 2023-11-10: qty 20

## 2023-11-10 MED ORDER — SODIUM CHLORIDE 0.9 % IV SOLN
50.0000 mg/m2 | Freq: Once | INTRAVENOUS | Status: AC
  Administered 2023-11-10: 100 mg via INTRAVENOUS
  Filled 2023-11-10: qty 5

## 2023-11-10 MED ORDER — HEPARIN SOD (PORK) LOCK FLUSH 100 UNIT/ML IV SOLN
INTRAVENOUS | Status: AC
  Filled 2023-11-10: qty 5

## 2023-11-10 MED ORDER — AMOXICILLIN-POT CLAVULANATE 875-125 MG PO TABS: 1.0000 | ORAL_TABLET | Freq: Two times a day (BID) | ORAL | 0 refills | Status: DC

## 2023-11-10 MED ORDER — HEPARIN SOD (PORK) LOCK FLUSH 100 UNIT/ML IV SOLN
500.0000 [IU] | Freq: Once | INTRAVENOUS | Status: AC
  Administered 2023-11-10: 500 [IU] via INTRAVENOUS

## 2023-11-10 MED ORDER — SODIUM CHLORIDE 0.9 % IV SOLN
400.0000 mg/m2 | Freq: Once | INTRAVENOUS | Status: AC
  Administered 2023-11-10: 720 mg via INTRAVENOUS
  Filled 2023-11-10: qty 36

## 2023-11-10 MED ORDER — VINCRISTINE SULFATE CHEMO INJECTION 1 MG/ML
1.0000 mg | Freq: Once | INTRAVENOUS | Status: AC
  Administered 2023-11-10: 1 mg via INTRAVENOUS
  Filled 2023-11-10: qty 1

## 2023-11-10 MED ORDER — DEXAMETHASONE SODIUM PHOSPHATE 10 MG/ML IJ SOLN
10.0000 mg | Freq: Once | INTRAMUSCULAR | Status: AC
  Administered 2023-11-10: 10 mg via INTRAVENOUS
  Filled 2023-11-10: qty 1

## 2023-11-10 MED ORDER — LIDOCAINE HCL 1 % IJ SOLN
10.0000 mL | Freq: Once | INTRAMUSCULAR | Status: AC
  Administered 2023-11-10: 8 mL via INTRADERMAL

## 2023-11-10 MED ORDER — SODIUM CHLORIDE 0.9 % IV SOLN: INTRAVENOUS | Status: DC

## 2023-11-10 MED ORDER — SODIUM CHLORIDE 0.9 % IV SOLN
375.0000 mg/m2 | Freq: Once | INTRAVENOUS | Status: AC
  Administered 2023-11-10: 700 mg via INTRAVENOUS
  Filled 2023-11-10: qty 20

## 2023-11-10 MED ORDER — DIPHENHYDRAMINE HCL 25 MG PO CAPS
50.0000 mg | ORAL_CAPSULE | Freq: Once | ORAL | Status: AC
Start: 2023-11-10 — End: 2023-11-10
  Administered 2023-11-10: 50 mg via ORAL
  Filled 2023-11-10: qty 2

## 2023-11-10 NOTE — Patient Instructions (Signed)
 [facility] - A DEPT OF [facility]  Discharge Instructions: Thank you for choosing [facility] to provide your oncology and hematology care.   If you have a lab appointment with the Cancer Center, please go directly to the Cancer Center and check in at the registration area.   Wear comfortable clothing and clothing appropriate for easy access to any Portacath or PICC line.   We strive to give you quality time with your provider. You may need to reschedule your appointment if you arrive late (15 or more minutes).  Arriving late affects you and other patients whose appointments are after yours.  Also, if you miss three or more appointments without notifying the office, you may be dismissed from the clinic at the provider's discretion.      For prescription refill requests, have your pharmacy contact our office and allow 72 hours for refills to be completed.    Today you received the following chemotherapy and/or immunotherapy agents: vincristine , cyclophosphamide , etoposide , and rituximab       To help prevent nausea and vomiting after your treatment, we encourage you to take your nausea medication as directed.  BELOW ARE SYMPTOMS THAT SHOULD BE REPORTED IMMEDIATELY: *FEVER GREATER THAN 100.4 F (38 C) OR HIGHER *CHILLS OR SWEATING *NAUSEA AND VOMITING THAT IS NOT CONTROLLED WITH YOUR NAUSEA MEDICATION *UNUSUAL SHORTNESS OF BREATH *UNUSUAL BRUISING OR BLEEDING *URINARY PROBLEMS (pain or burning when urinating, or frequent urination) *BOWEL PROBLEMS (unusual diarrhea, constipation, pain near the anus) TENDERNESS IN MOUTH AND THROAT WITH OR WITHOUT PRESENCE OF ULCERS (sore throat, sores in mouth, or a toothache) UNUSUAL RASH, SWELLING OR PAIN  UNUSUAL VAGINAL DISCHARGE OR ITCHING   Items with * indicate a potential emergency and should be followed up as soon as possible or go to the Emergency Department if any problems should occur.  Please show  the CHEMOTHERAPY ALERT CARD or IMMUNOTHERAPY ALERT CARD at check-in to the Emergency Department and triage nurse.  Should you have questions after your visit or need to cancel or reschedule your appointment, please contact [facility] - [facility] DEPT [facility]  Dept: [phone]  and follow the prompts.  Office hours are 8:00 a.m. to 4:30 p.m. Monday - Friday. Please note that voicemails left after 4:00 p.m. may not be returned until the following business day.  We are closed weekends and major holidays. You have access to a nurse at all times for urgent questions. Please call the main number to the clinic Dept: [phone] and follow the prompts.   For any non-urgent questions, you may also contact your provider using MyChart. We now offer e-Visits for anyone 89 and older to request care online for non-urgent symptoms. For details visit [email or web address].   Also download the MyChart app! Go to the app store, search MyChart, open the app, select [facility], and log in with your MyChart username and password.

## 2023-11-10 NOTE — Procedures (Signed)
 Right double-lumen brachial vein PICC placed.  Tip SVC/right atrial junction.  Length 44 cm.  Okay to use.  Medication used-1 percent lidocaine  with epinephrine  to skin and subcutaneous tissue.  No immediate complications. EBL< 2 cc.

## 2023-11-11 ENCOUNTER — Ambulatory Visit: Attending: Physician Assistant | Admitting: Physician Assistant

## 2023-11-11 VITALS — BP 126/40 | HR 71 | Ht 66.0 in | Wt 165.0 lb

## 2023-11-11 DIAGNOSIS — I5032 Chronic diastolic (congestive) heart failure: Secondary | ICD-10-CM

## 2023-11-11 DIAGNOSIS — I824Z2 Acute embolism and thrombosis of unspecified deep veins of left distal lower extremity: Secondary | ICD-10-CM | POA: Diagnosis not present

## 2023-11-11 DIAGNOSIS — I2510 Atherosclerotic heart disease of native coronary artery without angina pectoris: Secondary | ICD-10-CM | POA: Diagnosis not present

## 2023-11-11 NOTE — Patient Instructions (Signed)
 Medication Instructions:  Your physician recommends that you continue on your current medications as directed. Please refer to the Current Medication list given to you today.  *If you need a refill on your cardiac medications before your next appointment, please call your pharmacy*  Lab Work: NONE ordered at this time of appointment   Testing/Procedures: NONE ordered at this time of appointment   Follow-Up: At [facility], you and your health needs are our priority.  As part of our continuing mission to provide you with exceptional heart care, our providers are all part of one team.  This team includes your primary Cardiologist (physician) and Advanced Practice Providers or APPs (Physician Assistants and Nurse Practitioners) who all work together to provide you with the care you need, when you need it.  Your next appointment:   4 month(s)  Provider:   Lonni Cash, MD    We recommend signing up for the patient portal called MyChart.  Sign up information is provided on this After Visit Summary.  MyChart is used to connect with patients for Virtual Visits (Telemedicine).  Patients are able to view lab/test results, encounter notes, upcoming appointments, etc.  Non-urgent messages can be sent to your provider as well.   To learn more about what you can do with MyChart, go to [email or web address].

## 2023-11-12 ENCOUNTER — Ambulatory Visit

## 2023-11-12 ENCOUNTER — Other Ambulatory Visit: Payer: Self-pay

## 2023-11-12 ENCOUNTER — Ambulatory Visit: Admitting: Hematology

## 2023-11-12 ENCOUNTER — Other Ambulatory Visit

## 2023-11-12 DIAGNOSIS — C8338 Diffuse large B-cell lymphoma, lymph nodes of multiple sites: Secondary | ICD-10-CM

## 2023-11-12 MED ORDER — ALLOPURINOL 100 MG PO TABS: 100.0000 mg | ORAL_TABLET | Freq: Two times a day (BID) | ORAL | 0 refills | Status: DC

## 2023-11-12 NOTE — Progress Notes (Addendum)
 PGY2 Oncology Pharmacy Resident Intervention  Intervention Made calendar for patient's daughter to assist with keeping track of medications/medication changes Updated patient's etoposide  dose, Dr. [name] increased to 100 mg/m^2 (200 mg) days 2-3 post IV chemo day 1 Dr. [name] restarted patient's prednisone  dose 60 mg Days 2-5 Extended Allopurinol  100 mg twice daily for an additional month due to disease progression New Rx sent by Landry Silvan, RN Dr. [name] started Augmentin  875/125 mg BID x 10 days  Walked through calendar with patient  Also included Oral Chemotherapy Education Sheet regarding Etoposide  for patient reference and review  Patient expressed understanding of the calendars. Let patient know if they need a new calendar they can request one at their September infusion appointment.  Calendars reviewed by Carolyn Hum, PharmD; Leotis Ferries, PharmD; Landry Silvan, RN.  Thank you for allowing pharmacy to participate in this patient's care.   Alfonso MARLA Buys, PharmD Pharmacy Resident

## 2023-11-13 NOTE — Progress Notes (Signed)
 Lymphoma Location(s) / Histology: Follicular Lymphoma  Patient is seen to discuss proceeding with radiation therapy to the right neck.  Biopsies of Face Mass 10/15/2023    Past/Anticipated interventions by medical oncology, if any:  Dr. [name] -Going through chemo for right neck mass.  Is going to need radiation.   Weight changes, if any, over the past 6 months: Denies.  Appetite: Good. Has not changed the consistency of foods he eats.  He has trouble with big pills.   Recurrent fevers, or drenching night sweats, if any:    SAFETY ISSUES: Prior radiation? Right Pelvis 9/9-9/20/2024, 25 Gy in 10 fractions Pacemaker/ICD? No Possible current pregnancy? N/a Is the patient on methotrexate? No  Current Complaints / other details:   -Patient states the mass has shrunk a little this week but notes that it will swell back up right away or a few days later.  -He reports the mass is hard in some areas and has some softer spots.

## 2023-11-14 ENCOUNTER — Ambulatory Visit
Admission: RE | Admit: 2023-11-14 | Discharge: 2023-11-14 | Disposition: A | Discharge status: Home / Self Care | Source: Ambulatory Visit | Attending: Radiation Oncology | Admitting: Radiation Oncology

## 2023-11-14 ENCOUNTER — Encounter: Payer: Self-pay | Admitting: Radiation Oncology

## 2023-11-14 ENCOUNTER — Other Ambulatory Visit: Payer: Self-pay

## 2023-11-14 ENCOUNTER — Inpatient Hospital Stay

## 2023-11-14 VITALS — BP 132/62 | HR 68 | Temp 98.4°F | Resp 16

## 2023-11-14 VITALS — BP 133/65 | HR 73 | Temp 97.5°F | Resp 18 | Ht 66.0 in | Wt 161.4 lb

## 2023-11-14 DIAGNOSIS — C8338 Diffuse large B-cell lymphoma, lymph nodes of multiple sites: Secondary | ICD-10-CM

## 2023-11-14 DIAGNOSIS — I10 Essential (primary) hypertension: Secondary | ICD-10-CM | POA: Diagnosis not present

## 2023-11-14 DIAGNOSIS — I2510 Atherosclerotic heart disease of native coronary artery without angina pectoris: Secondary | ICD-10-CM | POA: Insufficient documentation

## 2023-11-14 DIAGNOSIS — C8208 Follicular lymphoma grade I, lymph nodes of multiple sites: Secondary | ICD-10-CM

## 2023-11-14 DIAGNOSIS — C8218 Follicular lymphoma grade II, lymph nodes of multiple sites: Secondary | ICD-10-CM

## 2023-11-14 DIAGNOSIS — Z7982 Long term (current) use of aspirin: Secondary | ICD-10-CM | POA: Insufficient documentation

## 2023-11-14 DIAGNOSIS — Z7901 Long term (current) use of anticoagulants: Secondary | ICD-10-CM | POA: Insufficient documentation

## 2023-11-14 DIAGNOSIS — N400 Enlarged prostate without lower urinary tract symptoms: Secondary | ICD-10-CM | POA: Insufficient documentation

## 2023-11-14 DIAGNOSIS — C8331 Diffuse large B-cell lymphoma, lymph nodes of head, face, and neck: Secondary | ICD-10-CM | POA: Diagnosis present

## 2023-11-14 DIAGNOSIS — Z79899 Other long term (current) drug therapy: Secondary | ICD-10-CM | POA: Diagnosis not present

## 2023-11-14 DIAGNOSIS — Z86718 Personal history of other venous thrombosis and embolism: Secondary | ICD-10-CM | POA: Insufficient documentation

## 2023-11-14 DIAGNOSIS — Z7952 Long term (current) use of systemic steroids: Secondary | ICD-10-CM | POA: Diagnosis not present

## 2023-11-14 DIAGNOSIS — Z5111 Encounter for antineoplastic chemotherapy: Secondary | ICD-10-CM | POA: Diagnosis not present

## 2023-11-14 DIAGNOSIS — E785 Hyperlipidemia, unspecified: Secondary | ICD-10-CM | POA: Insufficient documentation

## 2023-11-14 DIAGNOSIS — Z79624 Long term (current) use of inhibitors of nucleotide synthesis: Secondary | ICD-10-CM | POA: Insufficient documentation

## 2023-11-14 DIAGNOSIS — Z79634 Long term (current) use of topoisomerase inhibitor: Secondary | ICD-10-CM | POA: Diagnosis not present

## 2023-11-14 DIAGNOSIS — Z923 Personal history of irradiation: Secondary | ICD-10-CM | POA: Insufficient documentation

## 2023-11-14 MED ORDER — PEGFILGRASTIM INJECTION 6 MG/0.6ML ~~LOC~~
6.0000 mg | PREFILLED_SYRINGE | Freq: Once | SUBCUTANEOUS | Status: AC
  Administered 2023-11-14: 6 mg via SUBCUTANEOUS
  Filled 2023-11-14: qty 0.6

## 2023-11-14 NOTE — Patient Instructions (Signed)
 PICC Removal, Adult, Care After The following information offers guidance on how to care for yourself after your procedure. Your health care provider may also give you more specific instructions. If you have problems or questions, contact your health care provider. What can I expect after the procedure? After the procedure, it is common to have: Tenderness or soreness. Redness, swelling, or a scab at the place where your PICC was removed (exit site). Follow these instructions at home: For the first 24 hours after the procedure: Keep the bandage (dressing) on your exit site clean and dry. Do not remove your dressing until your health care provider tells you to do so. Do not lift anything heavy or do activities that require great effort until your health care provider says it is okay. You should avoid: Lifting weights. Doing yard work. Doing any physical activity with repetitive arm movement. Watch closely for any signs of an air bubble in the vein (air embolism). This is a rare but serious complication. Signs of an air embolism include trouble breathing, wheezing, chest pain, or a fast pulse. If you have signs of an air embolism, call 911 right away and lie down on your left side to keep the air from moving into your lungs. After 24 hours have passed:  Remove your dressing as told by your health care provider. Wash your hands with soap and water for at least 20 seconds before and after you change your dressing. If soap and water are not available, use hand sanitizer. Return to your normal activities as told by your health care provider. A small scab may develop over the exit site. Do not pick at the scab. When bathing or showering, gently wash the exit site with soap and water. Pat it dry. Watch for signs of infection, such as: A fever or chills. Swollen glands under your arm. More redness, swelling, or soreness around your arm. Blood, fluid, or pus coming from your exit site. Warmth or a  bad smell coming from your exit site. A red streak spreading away from your exit site. General instructions Take over-the-counter and prescription medicines only as told by your health care provider. Do not take any new medicines without checking with your health care provider first. If you were given an antibiotic ointment, apply it as told by your health care provider. Keep all follow-up visits. This is important. Contact a health care provider if: You have a fever or chills. You have swelling at your exit site or swollen glands under your arm. You have signs of infection at your exit site. You have soreness, redness, or swelling in your arm that gets worse. Get help right away if: You have numbness or tingling in your fingers, hand, or arm. Your arm looks blue and feels cold. You have signs of an air embolism, such as trouble breathing, wheezing, chest pain, or a fast pulse. These symptoms may be an emergency. Get medical help right away. Call 911. Do not wait to see if the symptoms will go away. Do not drive yourself to the hospital. Summary After a PICC is removed, it is common to have tenderness or soreness, redness, swelling, or a scab at the exit site. Keep the bandage (dressing) over the exit site clean and dry. Do not remove the dressing until your health care provider tells you to do so. Do not lift anything heavy or do activities that require great effort until your health care provider says it is okay. Watch closely for any signs  of an air bubble (air embolism). If you have signs of an air embolism, call 911 right away and lie down on your left side. This information is not intended to replace advice given to you by your health care provider. Make sure you discuss any questions you have with your health care provider. Document Revised: 09/20/2020 Document Reviewed: 09/20/2020 Elsevier Patient Education  2024 [employer].

## 2023-11-14 NOTE — Progress Notes (Signed)
 Patient presents for PICC line removal per MD order. Order in place. Patient verified using two separate identifiers. PICC removed per institutional policy/protocol. Sterile, occlusive dressing applied to site. Discharge instructions reviewed thoroughly with patient and family member. Patient observed for 30 minutes after removal. No s/s of complication. Vital signs stable upon discharge.

## 2023-11-14 NOTE — Progress Notes (Addendum)
 Radiation Oncology         [phone] ________________________________  Name: Harold [name]        MRN: [id number]  [name] of Service: 11/14/2023 DOB: [birth year]  RR:Hpadnw, Alm, MD  Harold Emaline Brink, MD     REFERRING PHYSICIAN: [name], MD    Addendum Please see the note from Harold Due, PA-C from today's visit for more details of today's encounter.  I have personally performed a face to face diagnostic evaluation on this patient and devised the following assessment and plan.  The patient underwent evaluation for his diagnosis of lymphoma.  The patient has been treated with palliative radiation treatment for follicular lymphoma previously.  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  The tumor is quite bulky and bothersome at this time so we have been asked to see the patient for consideration of palliative radiation treatment to the site.  I believe this is very reasonable.  We would expect a good response which was discussed with the patient.  We also discussed possible side effects and risks which would include irritation of the throat and oral cavity given the location of the tumor.  All of his questions were answered and he wishes to proceed with treatment.  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    HISTORY OF PRESENT ILLNESS: Harold [name] is a 89 y.o. male seen at the request of Dr. [name].   Harold [name] was originally diagnosed with at least stage III follicular lymphoma grade 1 in 2 in May 2019 when he presented with Harold [name] cervical  lymphadenopathy.  Pathology results of the Harold [name] cervical lymph node demonstrated low-grade follicular lymphoma.  Patient underwent weekly rituximab  therapy from 11/05/18-11/26/2018, then again from 04/24/2020-05/15/2020.  He then received rituximab  q 60 days starting 08/07/2020-04/12/2021.  In September 2024, the patient presented with inguinal lymphadenopathy.  CT of the chest abdomen and pelvis on 11/19/2022 demonstrated extensive lymphadenopathy throughout the chest abdomen and pelvis.  He was subsequently treated with 25 Gray in 10 fractions to the Harold [name] pelvis.  Most recently restaging PET on 08/25/2023 demonstrated recurrent lymphoma with a large hypermetabolic subcutaneous mass in the Harold [name] face, along with multiple hypermetabolic and enlarged lymph nodes in the neck chest and abdomen, and a possible splenic and osseous involvement. He presented to Dr. [name] on 10/13/2023 and complained of a Harold [name] upper neck mass causing him discomfort and difficulty with swallowing.  Given concerns for large cell transformation of his follicular lymphoma, he was subsequently admitted for expedited workup and treatment.  He underwent core biopsy of the Harold [name] neck mass on 10/15/2023 which demonstrated large B-cell lymphoma.  He was subsequently discharged with plans to initiate chemotherapy on 10/20/2023.  Patient presented to the emergency room via EMS with complaints of chest pain, fever, generalized weakness on 10/25/2023.  He was found to have septic shock secondary to group B strep bacteremia and subsequently admitted to the intensive care unit and started on IV vancomycin  and cefepime .  He was initiated on p.o. atop both side on 10/24/2023 -10/25/2023.  Patient was subsequently discharged on 10/25/2023.  He received his Harold  cycle of Harold [name]  on 10/31/2023 and a second cycle on 11/10/2023.   The patient has experienced little improvement in his sided mass. He was subsequently referred to us  today to discuss radiation treatment  options.   PREVIOUS RADIATION THERAPY: Yes   ==========DELIVERED PLANS==========  Harold [name]: 2022-11-25 - Harold [name]: 2022-12-06   Plan Name: Harold [name] Site: Harold [name], Harold [name] Technique: 3D Mode: Photon Dose Per Fraction: 2.5 Gy Prescribed Dose (Delivered / Prescribed): 25 Gy / 25 Gy Prescribed Fxs (Delivered / Prescribed): 10 / 10   PAST MEDICAL HISTORY:  Past Medical History:  Diagnosis [name]   Harold [name] (benign prostatic hypertrophy)    Harold [name] (coronary artery disease) 01/2010   S/P drug eluting stents LAD and PL branch of [facility] Dr. Obie   Harold [name] (deep venous thrombosis) (Harold [name])    Harold [name] February 2022   follicular lymphoma dx'd 06/2017   Hernia    HLD (hyperlipidemia)    HTN (hypertension)        PAST SURGICAL HISTORY: Past Surgical History:  Procedure Laterality [name]   BACK SURGERY     CORONARY ANGIOGRAPHY N/A 01/29/2023   Procedure: CORONARY ANGIOGRAPHY;  Surgeon: Verlin Lonni BIRCH, MD;  Location: [facility];  Service: Cardiovascular;  Laterality: N/A;   CORONARY BALLOON ANGIOPLASTY N/A 01/29/2023   Procedure: CORONARY BALLOON ANGIOPLASTY;  Surgeon: Verlin Lonni BIRCH, MD;  Location: [facility];  Service: Cardiovascular;  Laterality: N/A;   CORONARY LITHOTRIPSY N/A 01/29/2023   Procedure: CORONARY LITHOTRIPSY;  Surgeon: Verlin Lonni BIRCH, MD;  Location: [facility];  Service: Cardiovascular;  Laterality: N/A;   CYSTOSCOPY WITH LITHOLAPAXY N/A 05/20/2019   Procedure: CYSTOSCOPY WITH LITHOLAPAXY;  Surgeon: Cam Morene ORN, MD;  Location: [facility];  Service: Urology;  Laterality: N/A;   CYSTOSCOPY/URETEROSCOPY/HOLMIUM LASER/STENT PLACEMENT Left 05/20/2019   Procedure: CYSTOSCOPY/URETEROSCOPY/HOLMIUM LASER/STENT PLACEMENT;  Surgeon: Cam Morene ORN, MD;  Location: [facility];  Service: Urology;  Laterality: Left;   HERNIA REPAIR  01/08/11   RIH   IR IMAGING GUIDED PORT INSERTION  10/15/2023   IR REMOVAL TUN  ACCESS W/ PORT W/O FL MOD SED  10/23/2023   IR US  GUIDE BX ASP/DRAIN  10/15/2023   MASS EXCISION Harold [name] 08/05/2017   Procedure: EXCISION Harold [name] POSTERIOR  NECK LYMPH NODE;  Surgeon: Ethyl Lonni BRAVO, MD;  Location: [facility];  Service: ENT;  Laterality: Harold [name];     FAMILY HISTORY:  Family History  Problem Relation Age of Onset   Heart failure Mother      SOCIAL HISTORY:  reports that he has never smoked. He has never used smokeless tobacco. He reports that he does not drink alcohol and does not use drugs.   ALLERGIES: Patient has no known allergies.   MEDICATIONS:  Current Outpatient Medications  Medication Sig Dispense Refill   acetaminophen  (TYLENOL ) 325 MG tablet Take 650 mg by mouth daily as needed for mild pain (pain score 1-3) or moderate pain (pain score 4-6).     acyclovir  (ZOVIRAX ) 400 MG tablet Take 1 tablet (400 mg total) by mouth daily. 30 tablet 3   allopurinol  (ZYLOPRIM ) 100 MG tablet Take 1 tablet (100 mg total) by mouth 2 (two) times daily. 60 tablet 0   amoxicillin -clavulanate (AUGMENTIN ) 875-125 MG tablet Take 1 tablet by mouth 2 (two) times daily. 20 tablet 0   apixaban  (ELIQUIS ) 5 MG TABS tablet Take 5 mg by mouth 2 (two) times daily.     aspirin  EC 81 MG tablet Take  1 tablet (81 mg total) by mouth daily. Swallow whole.     atenolol  (TENORMIN ) 25 MG tablet Take 0.5 tablets (12.5 mg total) by mouth daily. 30 tablet 0   brimonidine  (ALPHAGAN ) 0.2 % ophthalmic solution Place 1 drop into the Harold [name] eye daily.     cholecalciferol  (VITAMIN D ) 1000 UNITS tablet Take 2,000 Units by mouth daily.     cyanocobalamin  (VITAMIN B12) 1000 MCG tablet Take 1,000 mcg by mouth daily.     docusate sodium  (COLACE) 100 MG capsule Take 1 capsule (100 mg total) by mouth 2 (two) times daily as needed for mild constipation. 30 capsule 0   etoposide  (VEPESID ) 50 MG capsule Take 200 mg by mouth daily. (4 capsules) Day 2 and Day 3 of each cycle of R-CEOP.     finasteride   (PROSCAR ) 5 MG tablet Take 5 mg by mouth daily.     furosemide  (LASIX ) 20 MG tablet TAKE 1 TABLET BY MOUTH TWICE  DAILY 180 tablet 3   gabapentin  (NEURONTIN ) 100 MG capsule Take 100 mg by mouth daily as needed (for knee pain).     lidocaine -prilocaine  (EMLA ) cream Apply to affected area once 30 g 3   nitroGLYCERIN  (NITROSTAT ) 0.4 MG SL tablet Place 1 tablet (0.4 mg total) under the tongue every 5 (five) minutes as needed for chest pain. 25 tablet 5   omeprazole (PRILOSEC) 40 MG capsule Take 40 mg by mouth daily.     ondansetron  (ZOFRAN ) 8 MG tablet Take 1 tablet (8 mg total) by mouth every 8 (eight) hours as needed for nausea or vomiting. Start on day 3 after cyclophosphamide  chemotherapy. 30 tablet 1   pravastatin  (PRAVACHOL ) 40 MG tablet Take 1 tablet (40 mg total) by mouth at bedtime. 90 tablet 3   predniSONE  (DELTASONE ) 20 MG tablet Take 20 mg by mouth as directed. Take 3 tablets (60 mg total) by mouth daily. Take on days 2-5 of chemotherapy.     prochlorperazine  (COMPAZINE ) 10 MG tablet Take 1 tablet (10 mg total) by mouth every 6 (six) hours as needed for nausea or vomiting. 30 tablet 6   sucralfate (CARAFATE) 1 g tablet Take 1 g by mouth in the morning, at noon, and at bedtime.     No current facility-administered medications for this encounter.     REVIEW OF SYSTEMS: On review of systems, the patient reports that he is doing well overall. He notes some tenderness to the mass, and needs to take Tylenol  for this occasionally. He denies any issues with swallowing or breathing.     PHYSICAL EXAM:  Wt Readings from Harold 3 Encounters:  11/14/23 161 lb 6 oz (73.2 kg)  11/11/23 165 lb (74.8 kg)  10/31/23 154 lb (69.9 kg)   Temp Readings from Harold 3 Encounters:  11/14/23 (!) 97.5 F (36.4 C) (Temporal)  11/10/23 97.6 F (36.4 C) (Oral)  10/31/23 97.7 F (36.5 C) (Oral)   BP Readings from Harold 3 Encounters:  11/14/23 133/65  11/11/23 (!) 126/40  11/10/23 (!) 94/47   Pulse  Readings from Harold 3 Encounters:  11/14/23 73  11/11/23 71  11/10/23 66   Pain Assessment Pain Score: 0-No pain/10  In general this is a well appearing male in no acute distress. He's alert and oriented x4 and appropriate throughout the examination. Cardiopulmonary assessment is negative for acute distress and he exhibits normal effort.   He has a large mass in at his Harold [name] cheek. Tumor does not extend to the skin or oral mucosa.  ECOG = 2  0 - Asymptomatic (Fully active, able to carry on all predisease activities without restriction)  1 - Symptomatic but completely ambulatory (Restricted in physically strenuous activity but ambulatory and able to carry out work of a light or sedentary nature. For example, light housework, office work)  2 - Symptomatic, <50% in bed during the day (Ambulatory and capable of all self care but unable to carry out any work activities. Up and about more than 50% of waking hours)  3 - Symptomatic, >50% in bed, but not bedbound (Capable of only limited self-care, confined to bed or chair 50% or more of waking hours)  4 - Bedbound (Completely disabled. Cannot carry on any self-care. Totally confined to bed or chair)  5 - Death   Raylene MM, Creech RH, Tormey DC, et al. [phone]). Toxicity and response criteria of the [facility]. Am. DOROTHA Bridges. Oncol. 5 (6): 649-55    LABORATORY DATA:  Lab Results  Component Value [name]   WBC 12.2 (H) 11/10/2023   HGB 9.8 (L) 11/10/2023   HCT 30.9 (L) 11/10/2023   MCV 87.8 11/10/2023   PLT 235 11/10/2023   Lab Results  Component Value [name]   NA 143 11/10/2023   K 4.0 11/10/2023   CL 105 11/10/2023   CO2 31 11/10/2023   Lab Results  Component Value [name]   ALT 8 11/10/2023   AST 13 (L) 11/10/2023   ALKPHOS 81 11/10/2023   BILITOT 0.4 11/10/2023      RADIOGRAPHY: IR PICC PLACEMENT Harold [name] >5 YRS INC IMG GUIDE Result [name]: 11/10/2023 INDICATION: Patient with history of lymphoma and  recent Harold [name] chest wall Port-A-Cath removal on 10/23/23 secondary to hematoma/bacteremia; central venous access requested to assist with treatment EXAM: Harold [name] UPPER EXTREMITY PICC LINE PLACEMENT WITH ULTRASOUND AND FLUOROSCOPIC GUIDANCE MEDICATIONS: 3 mL 1% lidocaine  with epinephrine  to skin and subcutaneous tissue ANESTHESIA/SEDATION: None FLUOROSCOPY: Radiation Exposure Index (as provided by the fluoroscopic device): 3 mGy Kerma COMPLICATIONS: None immediate. PROCEDURE: The patient was advised of the possible risks and complications and agreed to undergo the procedure. The patient was then brought to the angiographic suite for the procedure. The Harold [name] arm was prepped with chlorhexidine , draped in the usual sterile fashion using maximum barrier technique (cap and mask, sterile gown, sterile gloves, large sterile sheet, hand hygiene and cutaneous antisepsis) and infiltrated locally with 1% Lidocaine . Ultrasound demonstrated patency of the Harold [name] brachial vein, and this was documented with an image. Under real-time ultrasound guidance, this vein was accessed with a 21 gauge micropuncture needle and image documentation was performed. A 0.018 wire was introduced in to the vein. Over this, a 5 [place] dual lumen power injectable PICC was advanced to the lower SVC/Harold [name] atrial junction. Fluoroscopy during the procedure and fluoro spot radiograph confirms appropriate catheter position. The catheter was flushed and covered with a sterile dressing. Catheter length: 44 cm IMPRESSION: Successful Harold [name] arm power PICC line placement with ultrasound and fluoroscopic guidance. The catheter is ready for use. Performed by: Franky Kelsie RIGGERS Electronically Signed   By: Marcey Moan M.D.   On: 11/10/2023 09:52   IR REMOVAL TUN ACCESS W/ PORT W/O FL MOD SED Result [name]: 10/23/2023 CLINICAL DATA:  89 year old male with hematoma and bacteremia after recent port placement. EXAM: REMOVAL OF IMPLANTED TUNNELED PORT-A-CATH MEDICATIONS:  None. ANESTHESIA/SEDATION: None. FLUOROSCOPY TIME:  None PROCEDURE: Informed written consent was obtained from the patient after a discussion of the risk, benefits and alternatives to the procedure. The patient  was positioned supine on the fluoroscopy table and the Harold [name] chest Port-A-Cath site was prepped with chlorhexidine . A sterile gown and gloves were worn during the procedure. Local anesthesia was provided with 1% lidocaine  with epinephrine . A timeout was performed prior to the initiation of the procedure. An incision was made overlying the Port-A-Cath with a #15 scalpel. Utilizing sharp and blunt dissection, the Port-A-Cath was removed completely. The pocket was irrigated with sterile saline. There was dark sanguinous material within the port pocket. Wound closure was performed with deep dermal interrupted 3-0 Vicryl and Dermabond. A dressing was placed. The patient tolerated the procedure well without immediate post procedural complication. FINDINGS: Successful removal of implant Port-A-Cath without immediate post procedural complication. IMPRESSION: Successful removal of implanted Port-A-Cath. Ester Sides, MD [facility] Electronically Signed   By: Ester Sides M.D.   On: 10/23/2023 15:30   CT SOFT TISSUE NECK W CONTRAST Result [name]: 10/20/2023 CLINICAL DATA:  Harold [name] neck mass EXAM: CT NECK WITH CONTRAST TECHNIQUE: Multidetector CT imaging of the neck was performed using the standard protocol following the bolus administration of intravenous contrast. RADIATION DOSE REDUCTION: This exam was performed according to the departmental dose-optimization program which includes automated exposure control, adjustment of the mA and/or kV according to patient size and/or use of iterative reconstruction technique. CONTRAST:  75mL OMNIPAQUE  IOHEXOL  300 MG/ML  SOLN COMPARISON:  10/14/2023 FINDINGS: Again noted is a large mass superficial to the mandible in the  Harold [name]-side of the neck. There is no hemorrhage. Pharynx: The nasopharynx, oropharynx and hypopharynx are normal Oral cavity/floor of mouth: Normal Larynx: Normal Salivary glands: The parotid and submandibular glands are normal Thyroid : Normal Lymph nodes: No adenopathy Vascular: No significant abnormality Limited intracranial: No significant abnormality Visualized orbits: No significant abnormality Mastoids and visualized paranasal sinuses: No significant abnormality Skeleton: No significant abnormality Upper chest: No significant abnormality Other: None IMPRESSION: Approximally 11.6 by 4 x 11 cm mass in the Harold [name] side of the face/neck unchanged from the prior study. No hemorrhage. Electronically Signed   By: Nancyann Burns M.D.   On: 10/20/2023 15:08   VAS US  LOWER EXTREMITY VENOUS (Harold [name]) Result [name]: 10/19/2023  Lower Venous Harold [name] Study Patient Name:  ZAYDYN HAVEY  [name] of Exam:   10/18/2023 Medical Rec #: [id number]       Accession #:    [id number] [name] of Birth: 03/11/1935       Patient Gender: M Patient Age:   69 years Exam Location:  [facility] Procedure:      VAS US  LOWER EXTREMITY VENOUS (Harold [name]) Referring Phys: LAMAR CHRIS --------------------------------------------------------------------------------  Indications: Harold [name] > L Edema.  Risk Factors: Cancer follicular/Non Hodgkin lymphoma with Harold [name] cervical/facial mass, evidence of recent rapid progression. Chronic HFpEF. Limitations: Pitting edema, involuntary patient movement. Comparison Study: Prior bilateral LEV done 05/05/20 indicating chronic left                   Harold Harold [name] Performing Technologist: Alberta Lis RVS  Examination Guidelines: A complete evaluation includes B-mode imaging, spectral Doppler, color Doppler, and power Doppler as needed of all accessible portions of each vessel. Bilateral testing is considered an integral part of a complete examination. Limited examinations for reoccurring indications may be performed as noted. The  reflux portion of the exam is performed with the patient in reverse Trendelenburg.  +---------+---------------+---------+-----------+----------+--------------+ Harold [name]    CompressibilityPhasicitySpontaneityPropertiesThrombus Aging +---------+---------------+---------+-----------+----------+--------------+ CFV      Full  Yes      No                                  +---------+---------------+---------+-----------+----------+--------------+ SFJ      Full                                                        +---------+---------------+---------+-----------+----------+--------------+ FV Prox  Full           Yes      No                                  +---------+---------------+---------+-----------+----------+--------------+ FV Mid   Full                                                        +---------+---------------+---------+-----------+----------+--------------+ FV DistalFull                                                        +---------+---------------+---------+-----------+----------+--------------+ PFV      Full           Yes      No                                  +---------+---------------+---------+-----------+----------+--------------+ POP      Full           Yes      No                                  +---------+---------------+---------+-----------+----------+--------------+ PTV      Full                                                        +---------+---------------+---------+-----------+----------+--------------+ PERO     Full                                                        +---------+---------------+---------+-----------+----------+--------------+   +---------+---------------+---------+-----------+----------+-------------------+ LEFT     CompressibilityPhasicitySpontaneityPropertiesThrombus Aging      +---------+---------------+---------+-----------+----------+-------------------+ CFV      Full            Yes      No                                       +---------+---------------+---------+-----------+----------+-------------------+ SFJ  Full                                                             +---------+---------------+---------+-----------+----------+-------------------+ FV Prox  Full           Yes      No                                       +---------+---------------+---------+-----------+----------+-------------------+ FV Mid   Full                                                             +---------+---------------+---------+-----------+----------+-------------------+ FV DistalFull           Yes      No                                       +---------+---------------+---------+-----------+----------+-------------------+ PFV      Full                                                             +---------+---------------+---------+-----------+----------+-------------------+ POP                     Yes      No                   patent by color and                                                       Doppler             +---------+---------------+---------+-----------+----------+-------------------+ PTV      None                                         Age Indeterminate   +---------+---------------+---------+-----------+----------+-------------------+ PERO     None                                         Age Indeterminate   +---------+---------------+---------+-----------+----------+-------------------+     Summary: Harold [name]: - There is no evidence of deep vein thrombosis in the lower extremity.  Interstitial edema noted throughout the extremity  LEFT: - Findings consistent with age indeterminate deep vein thrombosis involving the left posterior tibial veins, and left Harold veins.  Interstitial edema noted throughout the extremity.  *See table(s) above for measurements and observations. Electronically signed by  Debby Robertson on  10/19/2023 at 3:36:23 PM.    Final    ECHOCARDIOGRAM COMPLETE Result [name]: 10/19/2023    ECHOCARDIOGRAM REPORT   Patient Name:   Harold [name] [name] of Exam: 10/19/2023 Medical Rec #:  [id number]      Height:       66.0 in Accession #:    [id number]     Weight:       153.9 lb [name] of Birth:  Apr 07, 1934      BSA:          1.789 m Patient Age:    88 years       BP:           111/48 mmHg Patient Gender: M              HR:           69 bpm. Exam Location:  Inpatient Procedure: 2D Echo, Cardiac Doppler, Color Doppler, Strain Analysis and 3D Echo            (Both Spectral and Color Flow Doppler were utilized during            procedure). Indications:    Shock  History:        Patient has prior history of Echocardiogram examinations, most                 recent 10/15/2023. Harold [name] and Previous Myocardial Infarction, Mitral                 Valve Disease and Aortic Valve Disease; Risk                 Factors:Hypertension and Dyslipidemia.  Sonographer:    Therisa Crouch Referring Phys: 78 ROBERT S BYRUM IMPRESSIONS  1. No new RWMA;s since TTE done 10/15/23. Left ventricular ejection fraction, by estimation, is 60 to 65%. The left ventricle has normal function. The left ventricle has no regional wall motion abnormalities. Left ventricular diastolic parameters are consistent with Grade II diastolic dysfunction (pseudonormalization). Elevated left ventricular end-diastolic pressure. The average left ventricular global longitudinal strain is -19.9 %. The global longitudinal strain is normal.  2. Harold [name] ventricular systolic function is normal. The Harold [name] ventricular size is normal.  3. Harold [name] atrial size was mildly dilated.  4. The mitral valve is degenerative. Trivial mitral valve regurgitation. Mild mitral stenosis. Moderate mitral annular calcification.  5. Tricuspid valve regurgitation is moderate.  6. The aortic valve is tricuspid. There is moderate calcification of the aortic valve. There is moderate thickening of the aortic valve.  Aortic valve regurgitation is not visualized. Mild to moderate aortic valve stenosis.  7. The inferior vena cava is normal in size with greater than 50% respiratory variability, suggesting Harold [name] atrial pressure of 3 mmHg. FINDINGS  Left Ventricle: No new RWMA;s since TTE done 10/15/23. Left ventricular ejection fraction, by estimation, is 60 to 65%. The left ventricle has normal function. The left ventricle has no regional wall motion abnormalities. The average left ventricular global longitudinal strain is -19.9 %. Strain was performed and the global longitudinal strain is normal. The left ventricular internal cavity size was normal in size. There is no left ventricular hypertrophy. Left ventricular diastolic parameters are consistent with Grade II diastolic dysfunction (pseudonormalization). Elevated left ventricular end-diastolic pressure. Harold [name] Ventricle: The Harold [name] ventricular size is normal. No increase in Harold [name] ventricular wall thickness. Harold [name] ventricular systolic function is normal. Left Atrium: Left atrial size was normal in size. Harold [name] Atrium: Harold [name] atrial size  was mildly dilated. Pericardium: There is no evidence of pericardial effusion. Mitral Valve: The mitral valve is degenerative in appearance. Moderate mitral annular calcification. Trivial mitral valve regurgitation. Mild mitral valve stenosis. MV peak gradient, 8.9 mmHg. The mean mitral valve gradient is 5.0 mmHg. Tricuspid Valve: The tricuspid valve is normal in structure. Tricuspid valve regurgitation is moderate . No evidence of tricuspid stenosis. Aortic Valve: The aortic valve is tricuspid. There is moderate calcification of the aortic valve. There is moderate thickening of the aortic valve. Aortic valve regurgitation is not visualized. Mild to moderate aortic stenosis is present. Aortic valve mean gradient measures 11.7 mmHg. Aortic valve peak gradient measures 20.4 mmHg. Aortic valve area, by VTI measures 1.32 cm. Pulmonic Valve: The pulmonic  valve was normal in structure. Pulmonic valve regurgitation is trivial. No evidence of pulmonic stenosis. Aorta: The aortic root is normal in size and structure. Venous: The inferior vena cava is normal in size with greater than 50% respiratory variability, suggesting Harold [name] atrial pressure of 3 mmHg. IAS/Shunts: No atrial level shunt detected by color flow Doppler. Additional Comments: 3D was performed not requiring image post processing on an independent workstation and was indeterminate.  LEFT VENTRICLE PLAX 2D LVIDd:         3.80 cm   Diastology LVIDs:         2.50 cm   LV e' medial:    6.64 cm/s LV PW:         0.90 cm   LV E/e' medial:  17.3 LV IVS:        0.90 cm   LV e' lateral:   7.83 cm/s LVOT diam:     2.00 cm   LV E/e' lateral: 14.7 LV SV:         66 LV SV Index:   37        2D Longitudinal Strain LVOT Area:     3.14 cm  2D Strain GLS Avg:     -19.9 %                           3D Volume EF                          LV 3D EDV:   103.42 ml                          LV 3D ESV:   39.24 ml Harold [name] VENTRICLE             IVC RV Basal diam:  3.60 cm     IVC diam: 3.00 cm RV S prime:     13.30 cm/s TAPSE (M-mode): 2.4 cm LEFT ATRIUM             Index        Harold [name] ATRIUM           Index LA diam:        3.60 cm 2.01 cm/m   RA Area:     20.60 cm LA Vol (A2C):   77.6 ml 43.38 ml/m  RA Volume:   55.40 ml  30.97 ml/m LA Vol (A4C):   75.6 ml 42.26 ml/m LA Biplane Vol: 77.3 ml 43.21 ml/m  AORTIC VALVE AV Area (Vmax):    1.27 cm AV Area (Vmean):   1.23 cm AV Area (VTI):     1.32 cm AV Vmax:  226.00 cm/s AV Vmean:          158.333 cm/s AV VTI:            0.505 m AV Peak Grad:      20.4 mmHg AV Mean Grad:      11.7 mmHg LVOT Vmax:         91.05 cm/s LVOT Vmean:        62.100 cm/s LVOT VTI:          0.212 m LVOT/AV VTI ratio: 0.42  AORTA Ao Root diam: 3.50 cm Ao Asc diam:  3.20 cm MITRAL VALVE                TRICUSPID VALVE MV Area (PHT): 3.17 cm     TR Peak grad:   34.8 mmHg MV Area VTI:   1.32 cm     TR  Vmax:        295.00 cm/s MV Peak grad:  8.9 mmHg MV Mean grad:  5.0 mmHg     SHUNTS MV Vmax:       1.49 m/s     Systemic VTI:  0.21 m MV Vmean:      106.5 cm/s   Systemic Diam: 2.00 cm MV Decel Time: 239 msec MV E velocity: 115.00 cm/s MV A velocity: 130.00 cm/s MV E/A ratio:  0.88 Maude Emmer MD Electronically signed by Maude Emmer MD Signature [name]/Time: 10/19/2023/10:56:55 AM    Final    DG Chest Port 1 View Result [name]: 10/18/2023 EXAM: 1 VIEW XRAY OF THE CHEST 10/18/2023 02:13:00 AM COMPARISON: 01/01/2011 CLINICAL HISTORY: 355200 Chest pain [id number]. Per chart: Patient BIB EMS for chest pain and fever that started Harold night. Patient was seen here earlier this week and had port implanted for chemotherapy related to Non-Hodgkin's lymphoma. Patient given 324 ASA and 1 inch nitropaste from EMS. Denies any pain at this time. FINDINGS: LUNGS AND PLEURA: No focal pulmonary opacity. No pulmonary edema. No pleural effusion. No pneumothorax. HEART AND MEDIASTINUM: No acute abnormality of the cardiac and mediastinal silhouettes. BONES AND SOFT TISSUES: No acute osseous abnormality. Harold [name] chest wall port-a-cath with tip in the lower SVC. IMPRESSION: 1. No acute process. Electronically signed by: Franky Stanford MD 10/18/2023 02:24 AM EDT RP Workstation: [id number]   ECHOCARDIOGRAM COMPLETE Result [name]: 10/15/2023    ECHOCARDIOGRAM REPORT   Patient Name:   Harold [name] [name] of Exam: 10/15/2023 Medical Rec #:  [id number]      Height:       66.0 in Accession #:    [id number]     Weight:       155.3 lb [name] of Birth:  20-Apr-1934      BSA:          1.796 m Patient Age:    88 years       BP:           95/50 mmHg Patient Gender: M              HR:           68 bpm. Exam Location:  Inpatient Procedure: 2D Echo, 3D Echo, Strain Analysis, Cardiac Doppler and Color Doppler            (Both Spectral and Color Flow Doppler were utilized during            procedure). Indications:    Harold [name] native vessel I25.10  History:        Patient has  prior history of  Echocardiogram examinations, most                 recent 01/29/2023. Harold [name] and Previous Myocardial Infarction,                 Mitral Valve Disease; Risk Factors:Hypertension and                 Dyslipidemia.  Sonographer:    Koleen Popper RDCS Referring Phys: [phone] ANAND D HONGALGI  Sonographer Comments: Global longitudinal strain was attempted. IMPRESSIONS  1. Left ventricular ejection fraction, by estimation, is 60 to 65%. Left ventricular ejection fraction by 3D volume is 62 %. The left ventricle has normal function. The left ventricle has no regional wall motion abnormalities. Left ventricular diastolic  parameters are consistent with Grade II diastolic dysfunction (pseudonormalization). The average left ventricular global longitudinal strain is -19.9 %. The global longitudinal strain is normal.  2. Harold [name] ventricular systolic function is mildly reduced. The Harold [name] ventricular size is mildly enlarged. There is mildly elevated pulmonary artery systolic pressure. The estimated Harold [name] ventricular systolic pressure is 41.6 mmHg.  3. The mitral valve is normal in structure. Mild mitral valve regurgitation. Mild mitral stenosis. Severe mitral annular calcification.  4. The tricuspid valve is degenerative. Tricuspid valve regurgitation is moderate.  5. The aortic valve has an indeterminant number of cusps. There is severe calcifcation of the aortic valve. Aortic valve regurgitation is mild. Mild aortic valve stenosis. Aortic valve Vmax measures 2.17 m/s.  6. The inferior vena cava is dilated in size with <50% respiratory variability, suggesting Harold [name] atrial pressure of 15 mmHg. FINDINGS  Left Ventricle: Left ventricular ejection fraction, by estimation, is 60 to 65%. Left ventricular ejection fraction by 3D volume is 62 %. The left ventricle has normal function. The left ventricle has no regional wall motion abnormalities. The average left ventricular global longitudinal strain is -19.9 %. Strain was  performed and the global longitudinal strain is normal. The left ventricular internal cavity size was normal in size. There is no left ventricular hypertrophy. Left ventricular diastolic parameters are consistent with Grade II diastolic dysfunction (pseudonormalization). Harold [name] Ventricle: The Harold [name] ventricular size is mildly enlarged. No increase in Harold [name] ventricular wall thickness. Harold [name] ventricular systolic function is mildly reduced. There is mildly elevated pulmonary artery systolic pressure. The tricuspid regurgitant  velocity is 2.58 m/s, and with an assumed Harold [name] atrial pressure of 15 mmHg, the estimated Harold [name] ventricular systolic pressure is 41.6 mmHg. Left Atrium: Left atrial size was normal in size. Harold [name] Atrium: Harold [name] atrial size was normal in size. Pericardium: There is no evidence of pericardial effusion. Mitral Valve: The mitral valve is normal in structure. Severe mitral annular calcification. Mild mitral valve regurgitation. Mild mitral valve stenosis. MV peak gradient, 12.7 mmHg. The mean mitral valve gradient is 4.0 mmHg. Tricuspid Valve: The tricuspid valve is degenerative in appearance. Tricuspid valve regurgitation is moderate . No evidence of tricuspid stenosis. Aortic Valve: The aortic valve has an indeterminant number of cusps. There is severe calcifcation of the aortic valve. Aortic valve regurgitation is mild. Mild aortic stenosis is present. Aortic valve mean gradient measures 11.0 mmHg. Aortic valve peak gradient measures 18.8 mmHg. Aortic valve area, by VTI measures 1.07 cm. Pulmonic Valve: The pulmonic valve was grossly normal. Pulmonic valve regurgitation is mild. No evidence of pulmonic stenosis. Aorta: The aortic root is normal in size and structure. Venous: The inferior vena cava is dilated in size with less than 50% respiratory variability, suggesting Harold [name] atrial pressure of 15  mmHg. IAS/Shunts: No atrial level shunt detected by color flow Doppler. Additional Comments: 3D was  performed not requiring image post processing on an independent workstation and was normal.  LEFT VENTRICLE PLAX 2D LVIDd:         3.60 cm         Diastology LVIDs:         2.30 cm         LV e' medial:    6.20 cm/s LV PW:         1.00 cm         LV E/e' medial:  18.9 LV IVS:        1.00 cm         LV e' lateral:   5.77 cm/s LVOT diam:     1.50 cm         LV E/e' lateral: 20.3 LV SV:         46 LV SV Index:   26              2D Longitudinal LVOT Area:     1.77 cm        Strain                                2D Strain GLS   -19.9 %                                Avg:                                 3D Volume EF                                LV 3D EF:    Left                                             ventricul                                             ar                                             ejection                                             fraction                                             by 3D  volume is                                             62 %.                                 3D Volume EF:                                3D EF:        62 %                                LV EDV:       93 ml                                LV ESV:       35 ml                                LV SV:        58 ml Harold [name] VENTRICLE             IVC RV Basal diam:  4.30 cm     IVC diam: 2.40 cm RV Mid diam:    3.80 cm RV S prime:     14.90 cm/s TAPSE (M-mode): 2.0 cm LEFT ATRIUM             Index        Harold [name] ATRIUM           Index LA diam:        3.30 cm 1.84 cm/m   RA Area:     12.80 cm LA Vol (A2C):   37.9 ml 21.10 ml/m  RA Volume:   29.90 ml  16.65 ml/m LA Vol (A4C):   35.3 ml 19.65 ml/m LA Biplane Vol: 39.5 ml 21.99 ml/m  AORTIC VALVE AV Area (Vmax):    1.07 cm AV Area (Vmean):   0.94 cm AV Area (VTI):     1.07 cm AV Vmax:           217.00 cm/s AV Vmean:          157.000 cm/s AV VTI:            0.432 m AV Peak Grad:      18.8 mmHg AV Mean Grad:      11.0 mmHg  LVOT Vmax:         131.00 cm/s LVOT Vmean:        83.200 cm/s LVOT VTI:          0.261 m LVOT/AV VTI ratio: 0.60  AORTA Ao Root diam: 2.70 cm Ao Asc diam:  3.30 cm MITRAL VALVE                TRICUSPID VALVE MV Area (PHT): 2.16 cm     TR Peak grad:   26.6 mmHg MV Area VTI:   1.02 cm     TR Vmax:        258.00 cm/s MV Peak grad:  12.7 mmHg MV Mean  grad:  4.0 mmHg     SHUNTS MV Vmax:       1.78 m/s     Systemic VTI:  0.26 m MV Vmean:      95.9 cm/s    Systemic Diam: 1.50 cm MV Decel Time: 351 msec MV E velocity: 117.00 cm/s MV A velocity: 149.00 cm/s MV E/A ratio:  0.79 Morene Brownie Electronically signed by Morene Brownie Signature [name]/Time: 10/15/2023/6:24:54 PM    Final        IMPRESSION/PLAN: 63. 89 year old gentleman with diffuse large B-cell lymphoma and a rapidly enlarging Harold [name] face mass  We reviewed this patient's current workup.  He presents today with an enlarging Harold [name] face mass, unresponsive to Harold [name] .  Dr. Dewey recommends a palliative course of radiation treatment to the Harold [name] neck mass.  Today, I talked to the patient and family about the findings and work-up thus far.  We discussed the natural history of diffuse large B-cell lymphoma and general treatment, highlighting the role of radiotherapy in the management.  We discussed the available radiation techniques, and focused on the details of logistics and delivery.  We reviewed the anticipated acute and late sequelae associated with radiation in this setting.  The patient was encouraged to ask questions that I answered to the best of my ability. A patient consent form was discussed and signed.  We retained a copy for our records.  The patient would like to proceed with radiation and will be scheduled for CT simulation.  Anticipate 30 Gray in 10 fractions to the Harold [name] face mass. Of note, no additional chemotherapy is scheduled at this time. He is scheduled to follow-up with Dr. [name] on 11/25/2023.  In a visit lasting 40 minutes, greater  than 50% of the time was spent face to face discussing the patient's condition, in preparation for the discussion, and coordinating the patient's care.   The above documentation reflects my direct findings during this shared patient visit. Please see the separate note by Dr. Dewey on this [name] for the remainder of the patient's plan of care.    Leeroy Due, PA-C    **Disclaimer: This note was dictated with voice recognition software. Similar sounding words can inadvertently be transcribed and this note may contain transcription errors which may not have been corrected upon publication of note.**

## 2023-11-17 NOTE — Progress Notes (Unsigned)
 HEMATOLOGY/ONCOLOGY CLINIC NOTE  Date of Service: .11/10/2023  Patient Care Team: [name], MD as PCP - General (Family Medicine) Verlin Lonni BIRCH, MD as PCP - Cardiology (Cardiology)  CHIEF COMPLAINTS/PURPOSE OF CONSULTATION:  Follow-up for continued evaluation and management of transformed follicular lymphoma   HISTORY OF PRESENTING ILLNESS:  please see previous notes for details on initial presentation.  INTERVAL HISTORY:   Harold [name] is a 89 y.o. male here for continued evaluation and management of transformed follicular lymphoma.  For follow-up prior to cycle 2 of R-CEOP.  His first cycle of treatment MSK right upper neck and facial mass shrunk for about a week but has rapidly grown back to its original size.  There is some skin erythema over that area and the mass is bothering the patient quite a bit.  The other lymphadenopathy under his armpit and the supraclavicular area and groin have shrunk away and are likely to be present at this point.  We discussed that the lymphadenopathy was likely just relapsed follicular lymphoma and appears to be very responsive to treatment but the right upper neck/facial mass is behaving very aggressively. He has no fevers no chills no night sweats. No swallowing or breathing difficulties at this time. I recommended to the patient and his daughter are agreeable with consideration of palliative involved site radiation therapy to his symptomatic right neck mass to try to control local disease while we consider other treatment options. We discussed that the challenge with systemic therapy would be to get to a dose intensity of treatment that would allow for adequate control of his high-grade large B-cell lymphoma.  MEDICAL HISTORY:  Past Medical History:  Diagnosis Date   BPH (benign prostatic hypertrophy)    CAD (coronary artery disease) 01/2010   S/P drug eluting stents LAD and PL branch of [facility] Dr. 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   follicular lymphoma dx'd 06/2017   Hernia    HLD (hyperlipidemia)    HTN (hypertension)     SURGICAL HISTORY: Past Surgical History:  Procedure Laterality Date   BACK SURGERY     CORONARY ANGIOGRAPHY N/A 01/29/2023   Procedure: CORONARY ANGIOGRAPHY;  Surgeon: Verlin Lonni BIRCH, MD;  Location: [facility];  Service: Cardiovascular;  Laterality: N/A;   CORONARY BALLOON ANGIOPLASTY N/A 01/29/2023   Procedure: CORONARY BALLOON ANGIOPLASTY;  Surgeon: Verlin Lonni BIRCH, MD;  Location: [facility];  Service: Cardiovascular;  Laterality: N/A;   CORONARY LITHOTRIPSY N/A 01/29/2023   Procedure: CORONARY LITHOTRIPSY;  Surgeon: Verlin Lonni BIRCH, MD;  Location: [facility];  Service: Cardiovascular;  Laterality: N/A;   CYSTOSCOPY WITH LITHOLAPAXY N/A 05/20/2019   Procedure: CYSTOSCOPY WITH LITHOLAPAXY;  Surgeon: Cam Morene ORN, MD;  Location: [facility];  Service: Urology;  Laterality: N/A;   CYSTOSCOPY/URETEROSCOPY/HOLMIUM LASER/STENT PLACEMENT Left 05/20/2019   Procedure: CYSTOSCOPY/URETEROSCOPY/HOLMIUM LASER/STENT PLACEMENT;  Surgeon: Cam Morene ORN, MD;  Location: [facility];  Service: Urology;  Laterality: Left;   HERNIA REPAIR  01/08/11   RIH   IR IMAGING GUIDED PORT INSERTION  10/15/2023   IR REMOVAL TUN ACCESS W/ PORT W/O FL MOD SED  10/23/2023   IR US  GUIDE BX ASP/DRAIN  10/15/2023   MASS EXCISION Right 08/05/2017   Procedure: EXCISION RIGHT POSTERIOR  NECK LYMPH NODE;  Surgeon: Ethyl Lonni BRAVO, MD;  Location:  SURGERY CENTER;  Service: ENT;  Laterality: Right;    SOCIAL HISTORY: Social  History   Socioeconomic History   Marital status: Married    Spouse name: Not on file   Number of children: Not on file   Years of education: Not on file   Highest education level: Not on file  Occupational History   Not on file  Tobacco Use   Smoking status: Never   Smokeless tobacco:  Never  Vaping Use   Vaping status: Never Used  Substance and Sexual Activity   Alcohol use: No   Drug use: No   Sexual activity: Not on file  Other Topics Concern   Not on file  Social History Narrative   Married   Social Drivers of Health   Financial Resource Strain: Low Risk  (11/17/2022)   Received from [facility]   Overall Financial Resource Strain (CARDIA)    Difficulty of Paying Living Expenses: Not very hard  Food Insecurity: No Food Insecurity (10/18/2023)   Hunger Vital Sign    Worried About Running Out of Food in the Last Year: Never true    Ran Out of Food in the Last Year: Never true  Transportation Needs: No Transportation Needs (10/18/2023)   PRAPARE - Administrator, Civil Service (Medical): No    Lack of Transportation (Non-Medical): No  Physical Activity: Inactive (11/17/2022)   Received from [facility]   Exercise Vital Sign    On average, how many days per week do you engage in moderate to strenuous exercise (like a brisk walk)?: 0 days    On average, how many minutes do you engage in exercise at this level?: 0 min  Stress: Stress Concern Present (11/17/2022)   Received from [facility] - Occupational Stress Questionnaire    Feeling of Stress : Rather much  Social Connections: Moderately Integrated (10/18/2023)   Social Connection and Isolation Panel    Frequency of Communication with Friends and Family: Three times a week    Frequency of Social Gatherings with Friends and Family: Once a week    Attends Religious Services: 1 to 4 times per year    Active Member of [employer] or Organizations: Not on file    Attends Banker Meetings: 1 to 4 times per year    Marital Status: Widowed  Intimate Partner Violence: Not At Risk (10/18/2023)   Humiliation, Afraid, Rape, and Kick questionnaire    Fear of Current or Ex-Partner: No    Emotionally Abused: No    Physically Abused: No    Sexually Abused:  No    FAMILY HISTORY: Family History  Problem Relation Age of Onset   Heart failure Mother     ALLERGIES:  has no known allergies.  MEDICATIONS:  Current Outpatient Medications  Medication Sig Dispense Refill   amoxicillin -clavulanate (AUGMENTIN ) 875-125 MG tablet Take 1 tablet by mouth 2 (two) times daily. 20 tablet 0   acetaminophen  (TYLENOL ) 325 MG tablet Take 650 mg by mouth daily as needed for mild pain (pain score 1-3) or moderate pain (pain score 4-6).     acyclovir  (ZOVIRAX ) 400 MG tablet Take 1 tablet (400 mg total) by mouth daily. 30 tablet 3   allopurinol  (ZYLOPRIM ) 100 MG tablet Take 1 tablet (100 mg total) by mouth 2 (two) times daily. 60 tablet 0   apixaban  (ELIQUIS ) 5 MG TABS tablet Take 5 mg by mouth 2 (two) times daily.     aspirin  EC 81 MG tablet Take 1 tablet (81 mg total)  by mouth daily. Swallow whole.     atenolol  (TENORMIN ) 25 MG tablet Take 0.5 tablets (12.5 mg total) by mouth daily. 30 tablet 0   brimonidine  (ALPHAGAN ) 0.2 % ophthalmic solution Place 1 drop into the right eye daily.     cholecalciferol  (VITAMIN D ) 1000 UNITS tablet Take 2,000 Units by mouth daily.     cyanocobalamin  (VITAMIN B12) 1000 MCG tablet Take 1,000 mcg by mouth daily.     docusate sodium  (COLACE) 100 MG capsule Take 1 capsule (100 mg total) by mouth 2 (two) times daily as needed for mild constipation. 30 capsule 0   etoposide  (VEPESID ) 50 MG capsule Take 200 mg by mouth daily. (4 capsules) Day 2 and Day 3 of each cycle of R-CEOP.     finasteride  (PROSCAR ) 5 MG tablet Take 5 mg by mouth daily.     furosemide  (LASIX ) 20 MG tablet TAKE 1 TABLET BY MOUTH TWICE  DAILY 180 tablet 3   gabapentin  (NEURONTIN ) 100 MG capsule Take 100 mg by mouth daily as needed (for knee pain).     lidocaine -prilocaine  (EMLA ) cream Apply to affected area once 30 g 3   nitroGLYCERIN  (NITROSTAT ) 0.4 MG SL tablet Place 1 tablet (0.4 mg total) under the tongue every 5 (five) minutes as needed for chest pain. 25 tablet  5   omeprazole (PRILOSEC) 40 MG capsule Take 40 mg by mouth daily.     ondansetron  (ZOFRAN ) 8 MG tablet Take 1 tablet (8 mg total) by mouth every 8 (eight) hours as needed for nausea or vomiting. Start on day 3 after cyclophosphamide  chemotherapy. 30 tablet 1   pravastatin  (PRAVACHOL ) 40 MG tablet Take 1 tablet (40 mg total) by mouth at bedtime. 90 tablet 3   predniSONE  (DELTASONE ) 20 MG tablet Take 20 mg by mouth as directed. Take 3 tablets (60 mg total) by mouth daily. Take on days 2-5 of chemotherapy.     prochlorperazine  (COMPAZINE ) 10 MG tablet Take 1 tablet (10 mg total) by mouth every 6 (six) hours as needed for nausea or vomiting. 30 tablet 6   sucralfate (CARAFATE) 1 g tablet Take 1 g by mouth in the morning, at noon, and at bedtime.     No current facility-administered medications for this visit.    REVIEW OF SYSTEMS:    .10 Point review of Systems was done is negative except as noted above.   PHYSICAL EXAMINATION: ECOG FS:2 - Symptomatic, <50% confined to bed  There were no vitals filed for this visit.  Wt Readings from Last 3 Encounters:  11/14/23 161 lb 6 oz (73.2 kg)  11/11/23 165 lb (74.8 kg)  10/31/23 154 lb (69.9 kg)   There is no height or weight on file to calculate BMI.     GENERAL:alert, in no acute distress and comfortable SKIN: no acute rashes, no significant lesions EYES: conjunctiva are pink and non-injected, sclera anicteric OROPHARYNX: MMM, no exudates, no oropharyngeal erythema or ulceration NECK: supple, no JVD LYMPH: Large right upper neck/facial mass with some skin erythema, no palpable axillary or inguinal adenopathy at this time. LUNGS: clear to auscultation b/l with normal respiratory effort HEART: regular rate & rhythm ABDOMEN:  normoactive bowel sounds , non tender, not distended. Extremity: Bilateral 2+ pitting pedal edema PSYCH: alert & oriented x 3 with fluent speech NEURO: no focal motor/sensory deficits   LABORATORY DATA:  I have  reviewed the data as listed     Latest Ref Rng & Units 11/10/2023    9:48 AM  10/28/2023   11:25 AM 10/25/2023    3:05 AM  CBC  WBC 4.0 - 10.5 K/uL 12.2  9.5  7.6   Hemoglobin 13.0 - 17.0 g/dL 9.8  9.8  8.5   Hematocrit 39.0 - 52.0 % 30.9  30.3  27.4   Platelets 150 - 400 K/uL 235  300  176        Latest Ref Rng & Units 11/10/2023    9:48 AM 10/28/2023   11:25 AM 10/25/2023    3:05 AM  CMP  Glucose 70 - 99 mg/dL 79  879  866   BUN 8 - 23 mg/dL 17  37  28   Creatinine 0.61 - 1.24 mg/dL 9.26  9.18  9.18   Sodium 135 - 145 mmol/L 143  143  139   Potassium 3.5 - 5.1 mmol/L 4.0  4.3  4.1   Chloride 98 - 111 mmol/L 105  103  104   CO2 22 - 32 mmol/L 31  33  23   Calcium 8.9 - 10.3 mg/dL 8.7  9.3  8.5   Total Protein 6.5 - 8.1 g/dL 5.6  6.1  5.2   Total Bilirubin 0.0 - 1.2 mg/dL 0.4  0.7  0.6   Alkaline Phos 38 - 126 U/L 81  44  36   AST 15 - 41 U/L 13  10  16    ALT 0 - 44 U/L 8  12  11     Lab Results  Component Value Date   LDH 193 (H) 09/29/2023    08/27/2023 Surgical Pathology:      08/27/2023 Surgical Pathology:        02/18/2019 CT Abdomen Pelvis W Contrast (Accession [id number]):   02/18/2019 CT Chest W Contrast (Accession [id number]):  08/08/17 Pathology:   08/05/17 Pathology:     RADIOGRAPHIC STUDIES: I have personally reviewed the radiological images as listed and agreed with the findings in the report. IR PICC PLACEMENT RIGHT >5 YRS INC IMG GUIDE Result Date: 11/10/2023 INDICATION: Patient with history of lymphoma and recent right chest wall Port-A-Cath removal on 10/23/23 secondary to hematoma/bacteremia; central venous access requested to assist with treatment EXAM: RIGHT UPPER EXTREMITY PICC LINE PLACEMENT WITH ULTRASOUND AND FLUOROSCOPIC GUIDANCE MEDICATIONS: 3 mL 1% lidocaine  with epinephrine  to skin and subcutaneous tissue ANESTHESIA/SEDATION: None FLUOROSCOPY: Radiation Exposure Index (as provided by the fluoroscopic device): 3 mGy Kerma COMPLICATIONS:  None immediate. PROCEDURE: The patient was advised of the possible risks and complications and agreed to undergo the procedure. The patient was then brought to the angiographic suite for the procedure. The right arm was prepped with chlorhexidine , draped in the usual sterile fashion using maximum barrier technique (cap and mask, sterile gown, sterile gloves, large sterile sheet, hand hygiene and cutaneous antisepsis) and infiltrated locally with 1% Lidocaine . Ultrasound demonstrated patency of the right brachial vein, and this was documented with an image. Under real-time ultrasound guidance, this vein was accessed with a 21 gauge micropuncture needle and image documentation was performed. A 0.018 wire was introduced in to the vein. Over this, a 5 [place] dual lumen power injectable PICC was advanced to the lower SVC/right atrial junction. Fluoroscopy during the procedure and fluoro spot radiograph confirms appropriate catheter position. The catheter was flushed and covered with a sterile dressing. Catheter length: 44 cm IMPRESSION: Successful right arm power PICC line placement with ultrasound and fluoroscopic guidance. The catheter is ready for use. Performed by: Franky Kelsie RIGGERS Electronically Signed   By: Marcey Luverne HERO.D.  On: 11/10/2023 09:52   IR REMOVAL TUN ACCESS W/ PORT W/O FL MOD SED Result Date: 10/23/2023 CLINICAL DATA:  89 year old male with hematoma and bacteremia after recent port placement. EXAM: REMOVAL OF IMPLANTED TUNNELED PORT-A-CATH MEDICATIONS: None. ANESTHESIA/SEDATION: None. FLUOROSCOPY TIME:  None PROCEDURE: Informed written consent was obtained from the patient after a discussion of the risk, benefits and alternatives to the procedure. The patient was positioned supine on the fluoroscopy table and the right chest Port-A-Cath site was prepped with chlorhexidine . A sterile gown and gloves were worn during the procedure. Local anesthesia was provided with 1% lidocaine  with epinephrine .  A timeout was performed prior to the initiation of the procedure. An incision was made overlying the Port-A-Cath with a #15 scalpel. Utilizing sharp and blunt dissection, the Port-A-Cath was removed completely. The pocket was irrigated with sterile saline. There was dark sanguinous material within the port pocket. Wound closure was performed with deep dermal interrupted 3-0 Vicryl and Dermabond. A dressing was placed. The patient tolerated the procedure well without immediate post procedural complication. FINDINGS: Successful removal of implant Port-A-Cath without immediate post procedural complication. IMPRESSION: Successful removal of implanted Port-A-Cath. Ester Sides, MD [facility] Electronically Signed   By: Ester Sides M.D.   On: 10/23/2023 15:30   CT SOFT TISSUE NECK W CONTRAST Result Date: 10/20/2023 CLINICAL DATA:  Right neck mass EXAM: CT NECK WITH CONTRAST TECHNIQUE: Multidetector CT imaging of the neck was performed using the standard protocol following the bolus administration of intravenous contrast. RADIATION DOSE REDUCTION: This exam was performed according to the departmental dose-optimization program which includes automated exposure control, adjustment of the mA and/or kV according to patient size and/or use of iterative reconstruction technique. CONTRAST:  75mL OMNIPAQUE  IOHEXOL  300 MG/ML  SOLN COMPARISON:  10/14/2023 FINDINGS: Again noted is a large mass superficial to the mandible in the right-side of the neck. There is no hemorrhage. Pharynx: The nasopharynx, oropharynx and hypopharynx are normal Oral cavity/floor of mouth: Normal Larynx: Normal Salivary glands: The parotid and submandibular glands are normal Thyroid : Normal Lymph nodes: No adenopathy Vascular: No significant abnormality Limited intracranial: No significant abnormality Visualized orbits: No significant abnormality Mastoids and visualized paranasal sinuses: No  significant abnormality Skeleton: No significant abnormality Upper chest: No significant abnormality Other: None IMPRESSION: Approximally 11.6 by 4 x 11 cm mass in the right side of the face/neck unchanged from the prior study. No hemorrhage. Electronically Signed   By: Nancyann Burns M.D.   On: 10/20/2023 15:08   ECHOCARDIOGRAM COMPLETE Result Date: 10/19/2023    ECHOCARDIOGRAM REPORT   Patient Name:   Harold [name] Date of Exam: 10/19/2023 Medical Rec #:  [id number]      Height:       66.0 in Accession #:    [id number]     Weight:       153.9 lb Date of Birth:  06/01/1934      BSA:          1.789 m Patient Age:    88 years       BP:           111/48 mmHg Patient Gender: M              HR:           69 bpm. Exam Location:  Inpatient Procedure: 2D Echo, Cardiac Doppler, Color Doppler, Strain Analysis and 3D Echo            (Both Spectral  and Color Flow Doppler were utilized during            procedure). Indications:    Shock  History:        Patient has prior history of Echocardiogram examinations, most                 recent 10/15/2023. CAD and Previous Myocardial Infarction, Mitral                 Valve Disease and Aortic Valve Disease; Risk                 Factors:Hypertension and Dyslipidemia.  Sonographer:    Therisa Crouch Referring Phys: [id number] ROBERT S BYRUM IMPRESSIONS  1. No new RWMA;s since TTE done 10/15/23. Left ventricular ejection fraction, by estimation, is 60 to 65%. The left ventricle has normal function. The left ventricle has no regional wall motion abnormalities. Left ventricular diastolic parameters are consistent with Grade II diastolic dysfunction (pseudonormalization). Elevated left ventricular end-diastolic pressure. The average left ventricular global longitudinal strain is -19.9 %. The global longitudinal strain is normal.  2. Right ventricular systolic function is normal. The right ventricular size is normal.  3. Right atrial size was mildly dilated.  4. The mitral valve is degenerative. Trivial  mitral valve regurgitation. Mild mitral stenosis. Moderate mitral annular calcification.  5. Tricuspid valve regurgitation is moderate.  6. The aortic valve is tricuspid. There is moderate calcification of the aortic valve. There is moderate thickening of the aortic valve. Aortic valve regurgitation is not visualized. Mild to moderate aortic valve stenosis.  7. The inferior vena cava is normal in size with greater than 50% respiratory variability, suggesting right atrial pressure of 3 mmHg. FINDINGS  Left Ventricle: No new RWMA;s since TTE done 10/15/23. Left ventricular ejection fraction, by estimation, is 60 to 65%. The left ventricle has normal function. The left ventricle has no regional wall motion abnormalities. The average left ventricular global longitudinal strain is -19.9 %. Strain was performed and the global longitudinal strain is normal. The left ventricular internal cavity size was normal in size. There is no left ventricular hypertrophy. Left ventricular diastolic parameters are consistent with Grade II diastolic dysfunction (pseudonormalization). Elevated left ventricular end-diastolic pressure. Right Ventricle: The right ventricular size is normal. No increase in right ventricular wall thickness. Right ventricular systolic function is normal. Left Atrium: Left atrial size was normal in size. Right Atrium: Right atrial size was mildly dilated. Pericardium: There is no evidence of pericardial effusion. Mitral Valve: The mitral valve is degenerative in appearance. Moderate mitral annular calcification. Trivial mitral valve regurgitation. Mild mitral valve stenosis. MV peak gradient, 8.9 mmHg. The mean mitral valve gradient is 5.0 mmHg. Tricuspid Valve: The tricuspid valve is normal in structure. Tricuspid valve regurgitation is moderate . No evidence of tricuspid stenosis. Aortic Valve: The aortic valve is tricuspid. There is moderate calcification of the aortic valve. There is moderate thickening of  the aortic valve. Aortic valve regurgitation is not visualized. Mild to moderate aortic stenosis is present. Aortic valve mean gradient measures 11.7 mmHg. Aortic valve peak gradient measures 20.4 mmHg. Aortic valve area, by VTI measures 1.32 cm. Pulmonic Valve: The pulmonic valve was normal in structure. Pulmonic valve regurgitation is trivial. No evidence of pulmonic stenosis. Aorta: The aortic root is normal in size and structure. Venous: The inferior vena cava is normal in size with greater than 50% respiratory variability, suggesting right atrial pressure of 3 mmHg. IAS/Shunts: No atrial level shunt detected by  color flow Doppler. Additional Comments: 3D was performed not requiring image post processing on an independent workstation and was indeterminate.  LEFT VENTRICLE PLAX 2D LVIDd:         3.80 cm   Diastology LVIDs:         2.50 cm   LV e' medial:    6.64 cm/s LV PW:         0.90 cm   LV E/e' medial:  17.3 LV IVS:        0.90 cm   LV e' lateral:   7.83 cm/s LVOT diam:     2.00 cm   LV E/e' lateral: 14.7 LV SV:         66 LV SV Index:   37        2D Longitudinal Strain LVOT Area:     3.14 cm  2D Strain GLS Avg:     -19.9 %                           3D Volume EF                          LV 3D EDV:   103.42 ml                          LV 3D ESV:   39.24 ml RIGHT VENTRICLE             IVC RV Basal diam:  3.60 cm     IVC diam: 3.00 cm RV S prime:     13.30 cm/s TAPSE (M-mode): 2.4 cm LEFT ATRIUM             Index        RIGHT ATRIUM           Index LA diam:        3.60 cm 2.01 cm/m   RA Area:     20.60 cm LA Vol (A2C):   77.6 ml 43.38 ml/m  RA Volume:   55.40 ml  30.97 ml/m LA Vol (A4C):   75.6 ml 42.26 ml/m LA Biplane Vol: 77.3 ml 43.21 ml/m  AORTIC VALVE AV Area (Vmax):    1.27 cm AV Area (Vmean):   1.23 cm AV Area (VTI):     1.32 cm AV Vmax:           226.00 cm/s AV Vmean:          158.333 cm/s AV VTI:            0.505 m AV Peak Grad:      20.4 mmHg AV Mean Grad:      11.7 mmHg LVOT Vmax:          91.05 cm/s LVOT Vmean:        62.100 cm/s LVOT VTI:          0.212 m LVOT/AV VTI ratio: 0.42  AORTA Ao Root diam: 3.50 cm Ao Asc diam:  3.20 cm MITRAL VALVE                TRICUSPID VALVE MV Area (PHT): 3.17 cm     TR Peak grad:   34.8 mmHg MV Area VTI:   1.32 cm     TR Vmax:        295.00 cm/s MV Peak grad:  8.9 mmHg MV Mean grad:  5.0 mmHg  SHUNTS MV Vmax:       1.49 m/s     Systemic VTI:  0.21 m MV Vmean:      106.5 cm/s   Systemic Diam: 2.00 cm MV Decel Time: 239 msec MV E velocity: 115.00 cm/s MV A velocity: 130.00 cm/s MV E/A ratio:  0.88 Maude Emmer MD Electronically signed by Maude Emmer MD Signature Date/Time: 10/19/2023/10:56:55 AM    Final       ASSESSMENT & PLAN:   89 y.o. 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 No abdominal or pelvic involvement.    03/20/18 CT C/A/P revealed Progressive bilateral axillary and right inguinal lymphadenopathy, including necrotic lymph nodes in the right axilla, as above. Spleen is normal in size. 4 mm distal left ureteral calculus, unchanged. No hydronephrosis. Stable layering bladder calculi measuring up to 5 mm. Cholelithiasis, without associated inflammatory changes. Stable sequela of prior/chronic pancreatitis.   11/03/2018 CT neck revealed Marked interval progression of cervical lymphadenopathy since PET-CT 09/02/2017, now with lymphadenopathy present bilaterally at essentially all stations. Index nodes as described. This includes bilateral intraparotid lymphadenopathy. Bulky right-sided adenopathy results in multifocal narrowing of the right internal jugular vein. Significant narrowing of the mid to lower left internal jugular vein of unclear cause as  there is no significant mass effect at this level.  11/03/2018 CT CAP revealed 1. Progression of bilateral axillary, retroperitoneal, pelvic, and inguinal adenopathy. New anterior and posterior lower cervical adenopathy identified within the neck. 2. Persistent distal left ureteral calculus. Small bladder calculi are unchanged. 3. Aortic Atherosclerosis (ICD10-I70.0). Coronary artery calcifications. 4. Sequelae of chronic pancreatitis noted.  10/01/2019 CT Abd/Pel ([id number]) revealed no evidence of mass in right kidney, new and enlarged abdominal and pelvic lymph nodes.   2.  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 3. H/o head and neck Basal cell carcinoma Continue routine skin checks with dermatologist    4. Renal indeterminate lesion -06/28/19 Renal US  ([id number]) revealed New right-sided mass at the hilum which measures 1.5cm in size.   -resolved on subsequent scans   PLAN: Patient is status post cycle 1 of dose reduced R CEOP.  Patient notes no significant toxicity from his first cycle of treatment. He did develop significant 1-2+ pitting pedal edema steroids and is on diuretics. His right facial and neck mass shrunk only temporarily for about a week and has grown back significantly suggesting very aggressive behavior. We discussed that it might not be possible with her current regimen and doses of treatment to rapidly control his rt neck/facial mass and I would recommend palliative RT. Patient has been referred to Dr. Dewey for consideration of palliative involved site radiation therapy to control his right neck/facial mass In the presence of skin erythema and recent group B streptococcus bacteremia we will cover him with Augmentin  for possible cellulitis. Patient's labs from today were discussed with him in detail He is appropriate to proceed with cycle 2 of  R-CEOP with G-CSF support We shall take out his PICC line after completion of the cycle of treatment He might need another central line based on systemic treatment plans. Will likely switch him to Bendamustine /polatuzumab after  completion of his palliative radiation. We shall see him back around 10 to 12 days for toxicity check.  FOLLOW-UP: -Referral to radiation oncology for palliative radiation to the right neck and facial mass.  Discussed with Dr. Dewey. - Return to clinic with Dr. [name] with labs in 2 weeks for toxicity chec  The total time spent in the appointment was 40 minutes*.  All of the patient's questions were answered with apparent satisfaction. The patient knows to call the clinic with any problems, questions or concerns.   Emaline [name] MD MS AAHIVMS [facility] Hematology/Oncology Physician [facility]  .*Total Encounter Time as defined by the Centers for Medicare and Medicaid Services includes, in addition to the face-to-face time of a patient visit (documented in the note above) non-face-to-face time: obtaining and reviewing outside history, ordering and reviewing medications, tests or procedures, care coordination (communications with other health care professionals or caregivers) and documentation in the medical record.

## 2023-11-18 ENCOUNTER — Encounter: Payer: Self-pay | Admitting: Hematology

## 2023-11-18 NOTE — Addendum Note (Signed)
 Encounter addended by: Dewey Rush, MD on: 11/18/2023 11:28 AM  Actions taken: Clinical Note Signed

## 2023-11-24 ENCOUNTER — Other Ambulatory Visit: Payer: Self-pay

## 2023-11-24 ENCOUNTER — Ambulatory Visit
Admission: RE | Admit: 2023-11-24 | Discharge: 2023-11-24 | Disposition: A | Discharge status: Home / Self Care | Source: Ambulatory Visit | Attending: Radiation Oncology | Admitting: Radiation Oncology

## 2023-11-24 DIAGNOSIS — C8201 Follicular lymphoma grade I, lymph nodes of head, face, and neck: Secondary | ICD-10-CM | POA: Diagnosis present

## 2023-11-24 DIAGNOSIS — Z510 Encounter for antineoplastic radiation therapy: Secondary | ICD-10-CM | POA: Insufficient documentation

## 2023-11-24 DIAGNOSIS — C8338 Diffuse large B-cell lymphoma, lymph nodes of multiple sites: Secondary | ICD-10-CM

## 2023-11-25 ENCOUNTER — Inpatient Hospital Stay: Attending: Hematology | Admitting: Hematology

## 2023-11-25 ENCOUNTER — Inpatient Hospital Stay

## 2023-11-25 VITALS — BP 114/62 | HR 78 | Temp 97.7°F | Resp 20 | Wt 153.7 lb

## 2023-11-25 DIAGNOSIS — Z85820 Personal history of malignant melanoma of skin: Secondary | ICD-10-CM | POA: Diagnosis not present

## 2023-11-25 DIAGNOSIS — C8331 Diffuse large B-cell lymphoma, lymph nodes of head, face, and neck: Secondary | ICD-10-CM | POA: Insufficient documentation

## 2023-11-25 DIAGNOSIS — C8338 Diffuse large B-cell lymphoma, lymph nodes of multiple sites: Secondary | ICD-10-CM

## 2023-11-25 DIAGNOSIS — Z85828 Personal history of other malignant neoplasm of skin: Secondary | ICD-10-CM | POA: Diagnosis not present

## 2023-11-25 DIAGNOSIS — Z5111 Encounter for antineoplastic chemotherapy: Secondary | ICD-10-CM | POA: Diagnosis not present

## 2023-11-25 LAB — CBC WITH DIFFERENTIAL (CANCER CENTER ONLY)
Abs Immature Granulocytes: 2.09 K/uL — ABNORMAL HIGH (ref 0.00–0.07)
Basophils Absolute: 0.1 K/uL (ref 0.0–0.1)
Basophils Relative: 0 %
Eosinophils Absolute: 0.1 K/uL (ref 0.0–0.5)
Eosinophils Relative: 1 %
HCT: 29.6 % — ABNORMAL LOW (ref 39.0–52.0)
Hemoglobin: 9.3 g/dL — ABNORMAL LOW (ref 13.0–17.0)
Immature Granulocytes: 11 %
Lymphocytes Relative: 6 %
Lymphs Abs: 1.2 K/uL (ref 0.7–4.0)
MCH: 27.7 pg (ref 26.0–34.0)
MCHC: 31.4 g/dL (ref 30.0–36.0)
MCV: 88.1 fL (ref 80.0–100.0)
Monocytes Absolute: 1.5 K/uL — ABNORMAL HIGH (ref 0.1–1.0)
Monocytes Relative: 8 %
Neutro Abs: 14.7 K/uL — ABNORMAL HIGH (ref 1.7–7.7)
Neutrophils Relative %: 74 %
Platelet Count: 193 K/uL (ref 150–400)
RBC: 3.36 MIL/uL — ABNORMAL LOW (ref 4.22–5.81)
RDW: 15.8 % — ABNORMAL HIGH (ref 11.5–15.5)
Smear Review: NORMAL
WBC Count: 19.7 K/uL — ABNORMAL HIGH (ref 4.0–10.5)
nRBC: 0.2 % (ref 0.0–0.2)

## 2023-11-25 LAB — CMP (CANCER CENTER ONLY)
ALT: 6 U/L (ref 0–44)
AST: 13 U/L — ABNORMAL LOW (ref 15–41)
Albumin: 3.8 g/dL (ref 3.5–5.0)
Alkaline Phosphatase: 93 U/L (ref 38–126)
Anion gap: 6 (ref 5–15)
BUN: 17 mg/dL (ref 8–23)
CO2: 33 mmol/L — ABNORMAL HIGH (ref 22–32)
Calcium: 9 mg/dL (ref 8.9–10.3)
Chloride: 104 mmol/L (ref 98–111)
Creatinine: 0.89 mg/dL (ref 0.61–1.24)
GFR, Estimated: >60 mL/min (ref >60)
Glucose, Bld: 83 mg/dL (ref 70–99)
Potassium: 3.7 mmol/L (ref 3.5–5.1)
Sodium: 143 mmol/L (ref 135–145)
Total Bilirubin: 0.3 mg/dL (ref 0.0–1.2)
Total Protein: 5.8 g/dL — ABNORMAL LOW (ref 6.5–8.1)

## 2023-11-26 ENCOUNTER — Other Ambulatory Visit: Payer: Self-pay

## 2023-11-26 DIAGNOSIS — C8338 Diffuse large B-cell lymphoma, lymph nodes of multiple sites: Secondary | ICD-10-CM

## 2023-11-26 MED ORDER — ALLOPURINOL 100 MG PO TABS: 100.0000 mg | ORAL_TABLET | Freq: Two times a day (BID) | ORAL | 2 refills | Status: DC

## 2023-11-26 NOTE — Progress Notes (Signed)
 PGY2 Oncology Pharmacy Resident Intervention  Intervention Since right facial and neck mass shrunk only temporary for about a week and has grown back significantly suggesting aggressive behavior, the plan is to continue TLS until provider discretion based on response palliative radiation.  Rx for an additional two fills of allopurinol  sent to their pharmacy   Thank you for allowing pharmacy to participate in this patient's care.  Harold [name], PharmD Pharmacy Resident  11/26/2023 12:25 PM

## 2023-11-28 DIAGNOSIS — Z510 Encounter for antineoplastic radiation therapy: Secondary | ICD-10-CM | POA: Diagnosis not present

## 2023-12-01 ENCOUNTER — Other Ambulatory Visit: Payer: Self-pay | Admitting: Physician Assistant

## 2023-12-01 ENCOUNTER — Ambulatory Visit
Admission: RE | Admit: 2023-12-01 | Discharge: 2023-12-01 | Disposition: A | Discharge status: Home / Self Care | Source: Ambulatory Visit | Attending: Radiation Oncology | Admitting: Radiation Oncology

## 2023-12-01 ENCOUNTER — Other Ambulatory Visit: Payer: Self-pay

## 2023-12-01 DIAGNOSIS — Z510 Encounter for antineoplastic radiation therapy: Secondary | ICD-10-CM | POA: Diagnosis not present

## 2023-12-01 LAB — RAD ONC ARIA SESSION SUMMARY
Course Elapsed Days: 0
Plan Fractions Treated to Date: 1
Plan Prescribed Dose Per Fraction: 2.5 Gy
Plan Total Fractions Prescribed: 12
Plan Total Prescribed Dose: 30 Gy
Reference Point Dosage Given to Date: 2.5 Gy
Reference Point Session Dosage Given: 2.5 Gy
Session Number: 1

## 2023-12-01 MED ORDER — OXYCODONE HCL 5 MG PO TABS: 5.0000 mg | ORAL_TABLET | ORAL | 0 refills | Status: DC | PRN

## 2023-12-02 ENCOUNTER — Other Ambulatory Visit: Payer: Self-pay

## 2023-12-02 ENCOUNTER — Ambulatory Visit
Admission: RE | Admit: 2023-12-02 | Discharge: 2023-12-02 | Disposition: A | Discharge status: Home / Self Care | Source: Ambulatory Visit | Attending: Radiation Oncology

## 2023-12-02 DIAGNOSIS — Z510 Encounter for antineoplastic radiation therapy: Secondary | ICD-10-CM | POA: Diagnosis not present

## 2023-12-02 LAB — RAD ONC ARIA SESSION SUMMARY
Course Elapsed Days: 1
Plan Fractions Treated to Date: 2
Plan Prescribed Dose Per Fraction: 2.5 Gy
Plan Total Fractions Prescribed: 12
Plan Total Prescribed Dose: 30 Gy
Reference Point Dosage Given to Date: 5 Gy
Reference Point Session Dosage Given: 2.5 Gy
Session Number: 2

## 2023-12-03 ENCOUNTER — Other Ambulatory Visit: Payer: Self-pay

## 2023-12-03 ENCOUNTER — Ambulatory Visit
Admission: RE | Admit: 2023-12-03 | Discharge: 2023-12-03 | Disposition: A | Discharge status: Home / Self Care | Source: Ambulatory Visit | Attending: Radiation Oncology | Admitting: Radiation Oncology

## 2023-12-03 DIAGNOSIS — Z510 Encounter for antineoplastic radiation therapy: Secondary | ICD-10-CM | POA: Diagnosis not present

## 2023-12-03 LAB — RAD ONC ARIA SESSION SUMMARY
Course Elapsed Days: 2
Plan Fractions Treated to Date: 3
Plan Prescribed Dose Per Fraction: 2.5 Gy
Plan Total Fractions Prescribed: 12
Plan Total Prescribed Dose: 30 Gy
Reference Point Dosage Given to Date: 7.5 Gy
Reference Point Session Dosage Given: 2.5 Gy
Session Number: 3

## 2023-12-04 ENCOUNTER — Ambulatory Visit

## 2023-12-04 ENCOUNTER — Telehealth: Payer: Self-pay | Admitting: Radiation Oncology

## 2023-12-04 NOTE — Telephone Encounter (Signed)
 9/18 Patient's daughter called to confirmed appt time for patient treatment. Patient got several alerts from mychart and wasn't sure of time.  She is aware patient will see Dr. Dewey after his treatment appt.

## 2023-12-05 ENCOUNTER — Ambulatory Visit
Admission: RE | Admit: 2023-12-05 | Discharge: 2023-12-05 | Disposition: A | Discharge status: Home / Self Care | Source: Ambulatory Visit | Attending: Radiation Oncology | Admitting: Radiation Oncology

## 2023-12-05 ENCOUNTER — Other Ambulatory Visit: Payer: Self-pay

## 2023-12-05 ENCOUNTER — Encounter: Payer: Self-pay | Admitting: Hematology

## 2023-12-05 DIAGNOSIS — Z510 Encounter for antineoplastic radiation therapy: Secondary | ICD-10-CM | POA: Diagnosis not present

## 2023-12-05 LAB — RAD ONC ARIA SESSION SUMMARY
Course Elapsed Days: 4
Plan Fractions Treated to Date: 4
Plan Prescribed Dose Per Fraction: 2.5 Gy
Plan Total Fractions Prescribed: 12
Plan Total Prescribed Dose: 30 Gy
Reference Point Dosage Given to Date: 10 Gy
Reference Point Session Dosage Given: 2.5 Gy
Session Number: 4

## 2023-12-05 NOTE — Progress Notes (Signed)
 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  INTERVAL HISTORY:   Harold [name] is a 89 y.o. 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  He was accompanied by his daughter. The primary area of the large cell transformation of FL is in on the right upper neck/face.  There is right upper neck facial mass has been growing despite his second cycle of R-CEOP suggesting that the disease is refractory to this chemotherapy regimen. He is starting his palliative radiation therapy for this mass by Dr. Dewey. We discussed that hopefully the radiation therapy will help control the discomfort.  This large right neck and facial mass but will not likely control her disease in other parts of the body and we will have to consider alternative systemic therapies after radiation is completed. These treatment options were discussed with the patient. He notes his leg swelling is somewhat better. Some difficulty with speaking and swallowing due to/facial mass but no stridor.  No overt dysphagia.   MEDICAL HISTORY:  Past Medical History:  Diagnosis Date   BPH (benign prostatic hypertrophy)    CAD (coronary artery disease) 01/2010   S/P drug eluting stents LAD and PL branch of RCA Dr. 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   follicular lymphoma dx'd 06/2017   Hernia    HLD (hyperlipidemia)    HTN (hypertension)     SURGICAL HISTORY: Past Surgical History:  Procedure Laterality Date   BACK SURGERY     CORONARY ANGIOGRAPHY N/A 01/29/2023   Procedure:  CORONARY ANGIOGRAPHY;  Surgeon: Verlin Lonni BIRCH, MD;  Location: [facility];  Service: Cardiovascular;  Laterality: N/A;   CORONARY BALLOON ANGIOPLASTY N/A 01/29/2023   Procedure: CORONARY BALLOON ANGIOPLASTY;  Surgeon: Verlin Lonni BIRCH, MD;  Location: [facility];  Service: Cardiovascular;  Laterality: N/A;   CORONARY LITHOTRIPSY N/A 01/29/2023   Procedure: CORONARY LITHOTRIPSY;  Surgeon: Verlin Lonni BIRCH, MD;  Location: [facility];  Service: Cardiovascular;  Laterality: N/A;   CYSTOSCOPY WITH LITHOLAPAXY N/A 05/20/2019   Procedure: CYSTOSCOPY WITH LITHOLAPAXY;  Surgeon: Cam Morene ORN, MD;  Location: [facility];  Service: Urology;  Laterality: N/A;   CYSTOSCOPY/URETEROSCOPY/HOLMIUM LASER/STENT PLACEMENT Left 05/20/2019   Procedure: CYSTOSCOPY/URETEROSCOPY/HOLMIUM LASER/STENT PLACEMENT;  Surgeon: Cam Morene ORN, MD;  Location: [facility];  Service: Urology;  Laterality: Left;   HERNIA REPAIR  01/08/11   RIH   IR IMAGING GUIDED PORT INSERTION  10/15/2023   IR REMOVAL TUN ACCESS W/ PORT W/O FL MOD SED  10/23/2023   IR US  GUIDE BX ASP/DRAIN  10/15/2023   MASS EXCISION Right 08/05/2017   Procedure: EXCISION RIGHT POSTERIOR  NECK LYMPH NODE;  Surgeon: Ethyl Lonni BRAVO, MD;  Location: [facility];  Service: ENT;  Laterality: Right;    SOCIAL HISTORY: Social History   Socioeconomic History   Marital status: Married    Spouse name: Not on file   Number of children: Not on file   Years of education: Not on file   Highest education level: Not on file  Occupational History   Not on file  Tobacco Use   Smoking status: Never   Smokeless tobacco: Never  Vaping Use   Vaping status: Never Used  Substance and Sexual Activity   Alcohol use: No   Drug use: No   Sexual activity: Not on file  Other Topics Concern   Not on file  Social History Narrative   Married   Social Drivers of Health   Financial Resource Strain: Low Risk   (11/17/2022)   Received from [facility]   Overall Financial Resource Strain (CARDIA)    Difficulty of Paying Living Expenses: Not very hard  Food Insecurity: No Food Insecurity (10/18/2023)   Hunger Vital Sign    Worried About Running Out of Food in the Last Year: Never true    Ran Out of Food in the Last Year: Never true  Transportation Needs: No Transportation Needs (10/18/2023)   PRAPARE - Administrator, Civil Service (Medical): No    Lack of Transportation (Non-Medical): No  Physical Activity: Inactive (11/17/2022)   Received from [facility]   Exercise Vital Sign    On average, how many days per week do you engage in moderate to strenuous exercise (like a brisk walk)?: 0 days    On average, how many minutes do you engage in exercise at this level?: 0 min  Stress: Stress Concern Present (11/17/2022)   Received from [facility] of Occupational Health - Occupational Stress Questionnaire    Feeling of Stress : Rather much  Social Connections: Moderately Integrated (10/18/2023)   Social Connection and Isolation Panel    Frequency of Communication with Friends and Family: Three times a week    Frequency of Social Gatherings with Friends and Family: Once a week    Attends Religious Services: 1 to 4 times per year    Active Member of [employer] or Organizations: Not on file    Attends Banker Meetings: 1 to 4 times per year    Marital Status: Widowed  Intimate Partner Violence: Not At Risk (10/18/2023)   Humiliation, Afraid, Rape, and Kick questionnaire    Fear of Current or Ex-Partner: No    Emotionally Abused: No    Physically Abused: No    Sexually Abused: No    FAMILY HISTORY: Family History  Problem Relation Age of Onset   Heart failure Mother     ALLERGIES:  has no known allergies.  MEDICATIONS:  Current Outpatient Medications  Medication Sig Dispense Refill   acetaminophen  (TYLENOL ) 325 MG tablet Take 650 mg by mouth daily as  needed for mild pain (pain score 1-3) or moderate pain (pain score 4-6).     acyclovir  (ZOVIRAX ) 400 MG tablet Take 1 tablet (400 mg total) by mouth daily. 30 tablet 3   allopurinol  (ZYLOPRIM ) 100 MG tablet Take 1 tablet (100 mg total) by mouth 2 (two) times daily. 60 tablet 2   amoxicillin -clavulanate (AUGMENTIN ) 875-125 MG tablet Take 1 tablet by mouth 2 (two) times daily. 20 tablet 0   apixaban  (ELIQUIS ) 5 MG TABS tablet Take 5 mg by mouth 2 (two) times daily.     aspirin  EC 81 MG tablet Take 1 tablet (81 mg total) by mouth daily. Swallow whole.     atenolol  (TENORMIN ) 25 MG tablet Take 0.5 tablets (12.5 mg total) by mouth daily. 30 tablet 0   brimonidine  (ALPHAGAN ) 0.2 % ophthalmic solution Place 1 drop into the right eye daily.     cholecalciferol  (VITAMIN D ) 1000 UNITS  tablet Take 2,000 Units by mouth daily.     cyanocobalamin  (VITAMIN B12) 1000 MCG tablet Take 1,000 mcg by mouth daily.     docusate sodium  (COLACE) 100 MG capsule Take 1 capsule (100 mg total) by mouth 2 (two) times daily as needed for mild constipation. 30 capsule 0   etoposide  (VEPESID ) 50 MG capsule Take 200 mg by mouth daily. (4 capsules) Day 2 and Day 3 of each cycle of R-CEOP.     finasteride  (PROSCAR ) 5 MG tablet Take 5 mg by mouth daily.     furosemide  (LASIX ) 20 MG tablet TAKE 1 TABLET BY MOUTH TWICE  DAILY 180 tablet 3   gabapentin  (NEURONTIN ) 100 MG capsule Take 100 mg by mouth daily as needed (for knee pain).     lidocaine -prilocaine  (EMLA ) cream Apply to affected area once 30 g 3   nitroGLYCERIN  (NITROSTAT ) 0.4 MG SL tablet Place 1 tablet (0.4 mg total) under the tongue every 5 (five) minutes as needed for chest pain. 25 tablet 5   omeprazole (PRILOSEC) 40 MG capsule Take 40 mg by mouth daily.     ondansetron  (ZOFRAN ) 8 MG tablet Take 1 tablet (8 mg total) by mouth every 8 (eight) hours as needed for nausea or vomiting. Start on day 3 after cyclophosphamide  chemotherapy. 30 tablet 1   oxyCODONE  (OXY  IR/ROXICODONE ) 5 MG immediate release tablet Take 1 tablet (5 mg total) by mouth every 4 (four) hours as needed for severe pain (pain score 7-10). 30 tablet 0   pravastatin  (PRAVACHOL ) 40 MG tablet Take 1 tablet (40 mg total) by mouth at bedtime. 90 tablet 3   predniSONE  (DELTASONE ) 20 MG tablet Take 20 mg by mouth as directed. Take 3 tablets (60 mg total) by mouth daily. Take on days 2-5 of chemotherapy.     prochlorperazine  (COMPAZINE ) 10 MG tablet Take 1 tablet (10 mg total) by mouth every 6 (six) hours as needed for nausea or vomiting. 30 tablet 6   sucralfate (CARAFATE) 1 g tablet Take 1 g by mouth in the morning, at noon, and at bedtime.     No current facility-administered medications for this visit.    REVIEW OF SYSTEMS:    .10 Point review of Systems was done is negative except as noted above.  PHYSICAL EXAMINATION: ECOG FS:2 - Symptomatic, <50% confined to bed  Vitals:   11/25/23 1125  BP: 114/62  Pulse: 78  Resp: 20  Temp: 97.7 F (36.5 C)  SpO2: 99%    Wt Readings from Last 3 Encounters:  11/25/23 153 lb 11.2 oz (69.7 kg)  11/14/23 161 lb 6 oz (73.2 kg)  11/11/23 165 lb (74.8 kg)   Body mass index is 24.81 kg/m.    SABRA GENERAL:alert, in no acute distress and comfortable SKIN: no acute rashes, no significant lesions EYES: conjunctiva are pink and non-injected, sclera anicteric OROPHARYNX: MMM NECK: supple, large rt neck/face mass. LYMPH:  no palpable lymphadenopathy in the cervical, axillary or inguinal regions LUNGS: clear to auscultation b/l with normal respiratory effort HEART: regular rate & rhythm ABDOMEN:  normoactive bowel sounds , non tender, not distended. Extremity: no pedal edema PSYCH: alert & oriented x 3 with fluent speech NEURO: no focal motor/sensory deficits   LABORATORY DATA:  I have reviewed the data as listed     Latest Ref Rng & Units 11/25/2023   11:04 AM 11/10/2023    9:48 AM 10/28/2023   11:25 AM  CBC  WBC 4.0 - 10.5 K/uL 19.7  12.2  9.5   Hemoglobin 13.0 - 17.0 g/dL 9.3  9.8  9.8   Hematocrit 39.0 - 52.0 % 29.6  30.9  30.3   Platelets 150 - 400 K/uL 193  235  300        Latest Ref Rng & Units 11/25/2023   11:04 AM 11/10/2023    9:48 AM 10/28/2023   11:25 AM  CMP  Glucose 70 - 99 mg/dL 83  79  879   BUN 8 - 23 mg/dL 17  17  37   Creatinine 0.61 - 1.24 mg/dL 9.10  9.26  9.18   Sodium 135 - 145 mmol/L 143  143  143   Potassium 3.5 - 5.1 mmol/L 3.7  4.0  4.3   Chloride 98 - 111 mmol/L 104  105  103   CO2 22 - 32 mmol/L 33  31  33   Calcium 8.9 - 10.3 mg/dL 9.0  8.7  9.3   Total Protein 6.5 - 8.1 g/dL 5.8  5.6  6.1   Total Bilirubin 0.0 - 1.2 mg/dL 0.3  0.4  0.7   Alkaline Phos 38 - 126 U/L 93  81  44   AST 15 - 41 U/L 13  13  10    ALT 0 - 44 U/L 6  8  12     Lab Results  Component Value Date   LDH 193 (H) 09/29/2023      08/27/2023 Surgical Pathology:      08/27/2023 Surgical Pathology:        02/18/2019 CT Abdomen Pelvis W Contrast (Accession [id number]):   02/18/2019 CT Chest W Contrast (Accession [id number]):  08/08/17 Pathology:   08/05/17 Pathology:     RADIOGRAPHIC STUDIES: I have personally reviewed the radiological images as listed and agreed with the findings in the report. IR PICC PLACEMENT RIGHT >5 YRS INC IMG GUIDE Result Date: 11/10/2023 INDICATION: Patient with history of lymphoma and recent right chest wall Port-A-Cath removal on 10/23/23 secondary to hematoma/bacteremia; central venous access requested to assist with treatment EXAM: RIGHT UPPER EXTREMITY PICC LINE PLACEMENT WITH ULTRASOUND AND FLUOROSCOPIC GUIDANCE MEDICATIONS: 3 mL 1% lidocaine  with epinephrine  to skin and subcutaneous tissue ANESTHESIA/SEDATION: None FLUOROSCOPY: Radiation Exposure Index (as provided by the fluoroscopic device): 3 mGy Kerma COMPLICATIONS: None immediate. PROCEDURE: The patient was advised of the possible risks and complications and agreed to undergo the procedure. The patient was then  brought to the angiographic suite for the procedure. The right arm was prepped with chlorhexidine , draped in the usual sterile fashion using maximum barrier technique (cap and mask, sterile gown, sterile gloves, large sterile sheet, hand hygiene and cutaneous antisepsis) and infiltrated locally with 1% Lidocaine . Ultrasound demonstrated patency of the right brachial vein, and this was documented with an image. Under real-time ultrasound guidance, this vein was accessed with a 21 gauge micropuncture needle and image documentation was performed. A 0.018 wire was introduced in to the vein. Over this, a 5 [place] dual lumen power injectable PICC was advanced to the lower SVC/right atrial junction. Fluoroscopy during the procedure and fluoro spot radiograph confirms appropriate catheter position. The catheter was flushed and covered with a sterile dressing. Catheter length: 44 cm IMPRESSION: Successful right arm power PICC line placement with ultrasound and fluoroscopic guidance. The catheter is ready for use. Performed by: Franky Kelsie RIGGERS Electronically Signed   By: Marcey Moan M.D.   On: 11/10/2023 09:52      ASSESSMENT & PLAN:   89 y.o. 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 No abdominal or pelvic involvement.    03/20/18 CT C/A/P revealed Progressive bilateral axillary and right inguinal lymphadenopathy, including necrotic lymph nodes in the right axilla, as above. Spleen is normal in size. 4 mm distal left ureteral calculus, unchanged. No hydronephrosis. Stable layering bladder calculi measuring up to 5 mm. Cholelithiasis, without associated inflammatory changes. Stable sequela of prior/chronic  pancreatitis.   11/03/2018 CT neck revealed Marked interval progression of cervical lymphadenopathy since PET-CT 09/02/2017, now with lymphadenopathy present bilaterally at essentially all stations. Index nodes as described. This includes bilateral intraparotid lymphadenopathy. Bulky right-sided adenopathy results in multifocal narrowing of the right internal jugular vein. Significant narrowing of the mid to lower left internal jugular vein of unclear cause as there is no significant mass effect at this level.  11/03/2018 CT CAP revealed 1. Progression of bilateral axillary, retroperitoneal, pelvic, and inguinal adenopathy. New anterior and posterior lower cervical adenopathy identified within the neck. 2. Persistent distal left ureteral calculus. Small bladder calculi are unchanged. 3. Aortic Atherosclerosis (ICD10-I70.0). Coronary artery calcifications. 4. Sequelae of chronic pancreatitis noted.  10/01/2019 CT Abd/Pel ([id number]) revealed no evidence of mass in right kidney, new and enlarged abdominal and pelvic lymph nodes.   2.  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 3. H/o head and neck Basal cell carcinoma Continue routine skin checks with dermatologist    4. Renal indeterminate lesion -06/28/19 Renal US  [phone]) revealed New right-sided mass at the hilum which measures 1.5cm in size.   -resolved on subsequent scans   PLAN: Discussed labs from today with the patient and his daughter in details Rt neck mass if gettiing progressively larger and not responding to C2 of R-CEOP. He will start palliative RT to the large large b cell lymphoma in the neck/face to help palliatively. Will discontinued R-CEOP since this is not effective anymore. Alternative next line systemic treatment options were discussed with him and his daughter. He is not a candidate for HDT-Auto  HSCT and declines option fro consideration of Car-T at [facility]. We offered him options of BR-Pola, Loncastuximab-vedotin vs Monjuvi/+/-Revlimid. Patient wants to discuss these options upon completion of Palliative IFRT.  FOLLOW-UP: RTC with Dr [name] with labs in 3 weeks    .The total time spent in the appointment was 30 minutes* .  All of the patient's questions were answered with apparent satisfaction. The patient knows to call the clinic with any problems, questions or concerns.   Emaline [name] MD MS AAHIVMS [facility] Hematology/Oncology Physician [facility]  .*Total Encounter Time as defined by the Centers for Medicare and Medicaid Services includes, in addition to the face-to-face time of a patient visit (documented in the note above) non-face-to-face time: obtaining and reviewing outside history, ordering and reviewing medications, tests or procedures, care coordination (communications with other health care professionals or caregivers) and documentation in the medical record.

## 2023-12-08 ENCOUNTER — Ambulatory Visit
Admission: RE | Admit: 2023-12-08 | Discharge: 2023-12-08 | Disposition: A | Discharge status: Home / Self Care | Source: Ambulatory Visit | Attending: Radiation Oncology | Admitting: Radiation Oncology

## 2023-12-08 ENCOUNTER — Other Ambulatory Visit: Payer: Self-pay

## 2023-12-08 DIAGNOSIS — Z510 Encounter for antineoplastic radiation therapy: Secondary | ICD-10-CM | POA: Diagnosis not present

## 2023-12-08 LAB — RAD ONC ARIA SESSION SUMMARY
Course Elapsed Days: 7
Plan Fractions Treated to Date: 5
Plan Prescribed Dose Per Fraction: 2.5 Gy
Plan Total Fractions Prescribed: 12
Plan Total Prescribed Dose: 30 Gy
Reference Point Dosage Given to Date: 12.5 Gy
Reference Point Session Dosage Given: 2.5 Gy
Session Number: 5

## 2023-12-09 ENCOUNTER — Ambulatory Visit
Admission: RE | Admit: 2023-12-09 | Discharge: 2023-12-09 | Disposition: A | Discharge status: Home / Self Care | Source: Ambulatory Visit | Attending: Radiation Oncology | Admitting: Radiation Oncology

## 2023-12-09 ENCOUNTER — Other Ambulatory Visit: Payer: Self-pay

## 2023-12-09 DIAGNOSIS — Z510 Encounter for antineoplastic radiation therapy: Secondary | ICD-10-CM | POA: Diagnosis not present

## 2023-12-09 LAB — RAD ONC ARIA SESSION SUMMARY
Course Elapsed Days: 8
Plan Fractions Treated to Date: 6
Plan Prescribed Dose Per Fraction: 2.5 Gy
Plan Total Fractions Prescribed: 12
Plan Total Prescribed Dose: 30 Gy
Reference Point Dosage Given to Date: 15 Gy
Reference Point Session Dosage Given: 2.5 Gy
Session Number: 6

## 2023-12-10 ENCOUNTER — Ambulatory Visit
Admission: RE | Admit: 2023-12-10 | Discharge: 2023-12-10 | Disposition: A | Discharge status: Home / Self Care | Source: Ambulatory Visit | Attending: Radiation Oncology | Admitting: Radiation Oncology

## 2023-12-10 ENCOUNTER — Other Ambulatory Visit: Payer: Self-pay

## 2023-12-10 DIAGNOSIS — Z510 Encounter for antineoplastic radiation therapy: Secondary | ICD-10-CM | POA: Diagnosis not present

## 2023-12-10 LAB — RAD ONC ARIA SESSION SUMMARY
Course Elapsed Days: 9
Plan Fractions Treated to Date: 7
Plan Prescribed Dose Per Fraction: 2.5 Gy
Plan Total Fractions Prescribed: 12
Plan Total Prescribed Dose: 30 Gy
Reference Point Dosage Given to Date: 17.5 Gy
Reference Point Session Dosage Given: 2.5 Gy
Session Number: 7

## 2023-12-11 ENCOUNTER — Other Ambulatory Visit: Payer: Self-pay

## 2023-12-11 ENCOUNTER — Ambulatory Visit
Admission: RE | Admit: 2023-12-11 | Discharge: 2023-12-11 | Disposition: A | Discharge status: Home / Self Care | Source: Ambulatory Visit | Attending: Radiation Oncology

## 2023-12-11 ENCOUNTER — Ambulatory Visit
Admission: RE | Admit: 2023-12-11 | Discharge: 2023-12-11 | Discharge status: Home / Self Care | Source: Ambulatory Visit | Attending: Radiation Oncology

## 2023-12-11 DIAGNOSIS — Z510 Encounter for antineoplastic radiation therapy: Secondary | ICD-10-CM | POA: Diagnosis not present

## 2023-12-11 LAB — RAD ONC ARIA SESSION SUMMARY
Course Elapsed Days: 10
Plan Fractions Treated to Date: 8
Plan Prescribed Dose Per Fraction: 2.5 Gy
Plan Total Fractions Prescribed: 12
Plan Total Prescribed Dose: 30 Gy
Reference Point Dosage Given to Date: 20 Gy
Reference Point Session Dosage Given: 2.5 Gy
Session Number: 8

## 2023-12-12 ENCOUNTER — Other Ambulatory Visit: Payer: Self-pay

## 2023-12-12 ENCOUNTER — Ambulatory Visit

## 2023-12-12 ENCOUNTER — Ambulatory Visit
Admission: RE | Admit: 2023-12-12 | Discharge: 2023-12-12 | Disposition: A | Discharge status: Home / Self Care | Source: Ambulatory Visit | Attending: Radiation Oncology | Admitting: Radiation Oncology

## 2023-12-12 DIAGNOSIS — Z510 Encounter for antineoplastic radiation therapy: Secondary | ICD-10-CM | POA: Diagnosis not present

## 2023-12-12 LAB — RAD ONC ARIA SESSION SUMMARY
Course Elapsed Days: 11
Plan Fractions Treated to Date: 1
Plan Prescribed Dose Per Fraction: 2.5 Gy
Plan Total Fractions Prescribed: 6
Plan Total Prescribed Dose: 15 Gy
Reference Point Dosage Given to Date: 22.5 Gy
Reference Point Session Dosage Given: 2.5 Gy
Session Number: 9

## 2023-12-15 ENCOUNTER — Other Ambulatory Visit: Payer: Self-pay

## 2023-12-15 ENCOUNTER — Ambulatory Visit
Admission: RE | Admit: 2023-12-15 | Discharge: 2023-12-15 | Disposition: A | Discharge status: Home / Self Care | Source: Ambulatory Visit | Attending: Radiation Oncology | Admitting: Radiation Oncology

## 2023-12-15 ENCOUNTER — Encounter: Payer: Self-pay | Admitting: Hematology

## 2023-12-15 DIAGNOSIS — C8338 Diffuse large B-cell lymphoma, lymph nodes of multiple sites: Secondary | ICD-10-CM

## 2023-12-15 LAB — RAD ONC ARIA SESSION SUMMARY
Course Elapsed Days: 14
Plan Fractions Treated to Date: 2
Plan Prescribed Dose Per Fraction: 2.5 Gy
Plan Total Fractions Prescribed: 6
Plan Total Prescribed Dose: 15 Gy
Reference Point Dosage Given to Date: 25 Gy
Reference Point Session Dosage Given: 2.5 Gy
Session Number: 10

## 2023-12-16 ENCOUNTER — Ambulatory Visit
Admission: RE | Admit: 2023-12-16 | Discharge: 2023-12-16 | Disposition: A | Source: Ambulatory Visit | Attending: Radiation Oncology

## 2023-12-16 ENCOUNTER — Other Ambulatory Visit: Payer: Self-pay

## 2023-12-16 ENCOUNTER — Ambulatory Visit

## 2023-12-16 DIAGNOSIS — Z510 Encounter for antineoplastic radiation therapy: Secondary | ICD-10-CM | POA: Diagnosis not present

## 2023-12-16 LAB — RAD ONC ARIA SESSION SUMMARY
Course Elapsed Days: 15
Plan Fractions Treated to Date: 3
Plan Prescribed Dose Per Fraction: 2.5 Gy
Plan Total Fractions Prescribed: 6
Plan Total Prescribed Dose: 15 Gy
Reference Point Dosage Given to Date: 27.5 Gy
Reference Point Session Dosage Given: 2.5 Gy
Session Number: 11

## 2023-12-17 ENCOUNTER — Ambulatory Visit

## 2023-12-17 ENCOUNTER — Inpatient Hospital Stay: Admitting: Hematology

## 2023-12-17 ENCOUNTER — Encounter (HOSPITAL_COMMUNITY): Payer: Self-pay

## 2023-12-17 ENCOUNTER — Ambulatory Visit
Admission: RE | Admit: 2023-12-17 | Discharge: 2023-12-17 | Disposition: A | Discharge status: Home / Self Care | Source: Ambulatory Visit | Attending: Radiation Oncology | Admitting: Radiation Oncology

## 2023-12-17 ENCOUNTER — Other Ambulatory Visit: Payer: Self-pay

## 2023-12-17 ENCOUNTER — Inpatient Hospital Stay (HOSPITAL_COMMUNITY)

## 2023-12-17 ENCOUNTER — Inpatient Hospital Stay

## 2023-12-17 ENCOUNTER — Inpatient Hospital Stay (HOSPITAL_COMMUNITY)
Admission: AD | Admit: 2023-12-17 | Discharge: 2023-12-24 | DRG: 157 | Disposition: A | Discharge status: Hospice | Source: Ambulatory Visit | Attending: Internal Medicine | Admitting: Internal Medicine

## 2023-12-17 ENCOUNTER — Encounter (HOSPITAL_COMMUNITY): Payer: Self-pay | Admitting: Internal Medicine

## 2023-12-17 VITALS — BP 110/55 | HR 62 | Temp 98.4°F | Resp 18 | Wt 135.7 lb

## 2023-12-17 DIAGNOSIS — R52 Pain, unspecified: Secondary | ICD-10-CM

## 2023-12-17 DIAGNOSIS — Y842 Radiological procedure and radiotherapy as the cause of abnormal reaction of the patient, or of later complication, without mention of misadventure at the time of the procedure: Principal | ICD-10-CM | POA: Diagnosis present

## 2023-12-17 DIAGNOSIS — B370 Candidal stomatitis: Secondary | ICD-10-CM | POA: Diagnosis present

## 2023-12-17 DIAGNOSIS — Z85820 Personal history of malignant melanoma of skin: Secondary | ICD-10-CM

## 2023-12-17 DIAGNOSIS — Z7952 Long term (current) use of systemic steroids: Secondary | ICD-10-CM

## 2023-12-17 DIAGNOSIS — D638 Anemia in other chronic diseases classified elsewhere: Secondary | ICD-10-CM | POA: Diagnosis present

## 2023-12-17 DIAGNOSIS — E785 Hyperlipidemia, unspecified: Secondary | ICD-10-CM | POA: Diagnosis present

## 2023-12-17 DIAGNOSIS — I10 Essential (primary) hypertension: Secondary | ICD-10-CM | POA: Diagnosis not present

## 2023-12-17 DIAGNOSIS — I2510 Atherosclerotic heart disease of native coronary artery without angina pectoris: Secondary | ICD-10-CM | POA: Diagnosis present

## 2023-12-17 DIAGNOSIS — Z7982 Long term (current) use of aspirin: Secondary | ICD-10-CM

## 2023-12-17 DIAGNOSIS — K1233 Oral mucositis (ulcerative) due to radiation: Secondary | ICD-10-CM | POA: Diagnosis present

## 2023-12-17 DIAGNOSIS — R64 Cachexia: Secondary | ICD-10-CM | POA: Diagnosis present

## 2023-12-17 DIAGNOSIS — I890 Lymphedema, not elsewhere classified: Secondary | ICD-10-CM | POA: Diagnosis present

## 2023-12-17 DIAGNOSIS — Z85828 Personal history of other malignant neoplasm of skin: Secondary | ICD-10-CM

## 2023-12-17 DIAGNOSIS — Z681 Body mass index (BMI) 19 or less, adult: Secondary | ICD-10-CM | POA: Diagnosis not present

## 2023-12-17 DIAGNOSIS — Z510 Encounter for antineoplastic radiation therapy: Secondary | ICD-10-CM | POA: Insufficient documentation

## 2023-12-17 DIAGNOSIS — E860 Dehydration: Secondary | ICD-10-CM | POA: Diagnosis present

## 2023-12-17 DIAGNOSIS — Z87442 Personal history of urinary calculi: Secondary | ICD-10-CM

## 2023-12-17 DIAGNOSIS — R54 Age-related physical debility: Secondary | ICD-10-CM | POA: Diagnosis present

## 2023-12-17 DIAGNOSIS — I110 Hypertensive heart disease with heart failure: Secondary | ICD-10-CM | POA: Diagnosis present

## 2023-12-17 DIAGNOSIS — Z7901 Long term (current) use of anticoagulants: Secondary | ICD-10-CM

## 2023-12-17 DIAGNOSIS — Z515 Encounter for palliative care: Secondary | ICD-10-CM | POA: Diagnosis not present

## 2023-12-17 DIAGNOSIS — C8201 Follicular lymphoma grade I, lymph nodes of head, face, and neck: Secondary | ICD-10-CM | POA: Insufficient documentation

## 2023-12-17 DIAGNOSIS — Z8249 Family history of ischemic heart disease and other diseases of the circulatory system: Secondary | ICD-10-CM | POA: Diagnosis not present

## 2023-12-17 DIAGNOSIS — Z8619 Personal history of other infectious and parasitic diseases: Secondary | ICD-10-CM

## 2023-12-17 DIAGNOSIS — N400 Enlarged prostate without lower urinary tract symptoms: Secondary | ICD-10-CM | POA: Diagnosis present

## 2023-12-17 DIAGNOSIS — C8338 Diffuse large B-cell lymphoma, lymph nodes of multiple sites: Secondary | ICD-10-CM | POA: Diagnosis present

## 2023-12-17 DIAGNOSIS — E43 Unspecified severe protein-calorie malnutrition: Secondary | ICD-10-CM | POA: Diagnosis present

## 2023-12-17 DIAGNOSIS — R627 Adult failure to thrive: Secondary | ICD-10-CM | POA: Diagnosis present

## 2023-12-17 DIAGNOSIS — I5032 Chronic diastolic (congestive) heart failure: Secondary | ICD-10-CM | POA: Diagnosis present

## 2023-12-17 DIAGNOSIS — Z79899 Other long term (current) drug therapy: Secondary | ICD-10-CM

## 2023-12-17 DIAGNOSIS — F419 Anxiety disorder, unspecified: Secondary | ICD-10-CM | POA: Diagnosis present

## 2023-12-17 DIAGNOSIS — Z955 Presence of coronary angioplasty implant and graft: Secondary | ICD-10-CM | POA: Diagnosis not present

## 2023-12-17 DIAGNOSIS — R221 Localized swelling, mass and lump, neck: Secondary | ICD-10-CM | POA: Diagnosis present

## 2023-12-17 DIAGNOSIS — Z86718 Personal history of other venous thrombosis and embolism: Secondary | ICD-10-CM

## 2023-12-17 DIAGNOSIS — K861 Other chronic pancreatitis: Secondary | ICD-10-CM | POA: Diagnosis present

## 2023-12-17 DIAGNOSIS — Z7189 Other specified counseling: Secondary | ICD-10-CM | POA: Diagnosis not present

## 2023-12-17 DIAGNOSIS — I252 Old myocardial infarction: Secondary | ICD-10-CM | POA: Diagnosis not present

## 2023-12-17 DIAGNOSIS — Z66 Do not resuscitate: Secondary | ICD-10-CM | POA: Diagnosis present

## 2023-12-17 LAB — CBC WITH DIFFERENTIAL/PLATELET
Abs Immature Granulocytes: 0.06 K/uL (ref 0.00–0.07)
Basophils Absolute: 0 K/uL (ref 0.0–0.1)
Basophils Relative: 1 %
Eosinophils Absolute: 0.1 K/uL (ref 0.0–0.5)
Eosinophils Relative: 1 %
HCT: 34.9 % — ABNORMAL LOW (ref 39.0–52.0)
Hemoglobin: 10.7 g/dL — ABNORMAL LOW (ref 13.0–17.0)
Immature Granulocytes: 1 %
Lymphocytes Relative: 9 %
Lymphs Abs: 0.8 K/uL (ref 0.7–4.0)
MCH: 27.2 pg (ref 26.0–34.0)
MCHC: 30.7 g/dL (ref 30.0–36.0)
MCV: 88.8 fL (ref 80.0–100.0)
Monocytes Absolute: 1 K/uL (ref 0.1–1.0)
Monocytes Relative: 12 %
Neutro Abs: 6.6 K/uL (ref 1.7–7.7)
Neutrophils Relative %: 76 %
Platelets: 251 K/uL (ref 150–400)
RBC: 3.93 MIL/uL — ABNORMAL LOW (ref 4.22–5.81)
RDW: 15.9 % — ABNORMAL HIGH (ref 11.5–15.5)
WBC: 8.5 K/uL (ref 4.0–10.5)
nRBC: 0 % (ref 0.0–0.2)

## 2023-12-17 LAB — COMPREHENSIVE METABOLIC PANEL WITH GFR
ALT: 7 U/L (ref 0–44)
AST: 18 U/L (ref 15–41)
Albumin: 3.9 g/dL (ref 3.5–5.0)
Alkaline Phosphatase: 58 U/L (ref 38–126)
Anion gap: 16 — ABNORMAL HIGH (ref 5–15)
BUN: 17 mg/dL (ref 8–23)
CO2: 26 mmol/L (ref 22–32)
Calcium: 9.6 mg/dL (ref 8.9–10.3)
Chloride: 97 mmol/L — ABNORMAL LOW (ref 98–111)
Creatinine, Ser: 0.71 mg/dL (ref 0.61–1.24)
GFR, Estimated: >60 mL/min (ref >60)
Glucose, Bld: 79 mg/dL (ref 70–99)
Potassium: 3.9 mmol/L (ref 3.5–5.1)
Sodium: 139 mmol/L (ref 135–145)
Total Bilirubin: 0.8 mg/dL (ref 0.0–1.2)
Total Protein: 5.9 g/dL — ABNORMAL LOW (ref 6.5–8.1)

## 2023-12-17 LAB — CMP (CANCER CENTER ONLY)
ALT: 7 U/L (ref 0–44)
AST: 14 U/L — ABNORMAL LOW (ref 15–41)
Albumin: 4 g/dL (ref 3.5–5.0)
Alkaline Phosphatase: 52 U/L (ref 38–126)
Anion gap: 9 (ref 5–15)
BUN: 18 mg/dL (ref 8–23)
CO2: 32 mmol/L (ref 22–32)
Calcium: 9.3 mg/dL (ref 8.9–10.3)
Chloride: 98 mmol/L (ref 98–111)
Creatinine: 0.8 mg/dL (ref 0.61–1.24)
GFR, Estimated: >60 mL/min (ref >60)
Glucose, Bld: 92 mg/dL (ref 70–99)
Potassium: 3.7 mmol/L (ref 3.5–5.1)
Sodium: 139 mmol/L (ref 135–145)
Total Bilirubin: 0.8 mg/dL (ref 0.0–1.2)
Total Protein: 6.2 g/dL — ABNORMAL LOW (ref 6.5–8.1)

## 2023-12-17 LAB — RAD ONC ARIA SESSION SUMMARY
Course Elapsed Days: 16
Plan Fractions Treated to Date: 4
Plan Prescribed Dose Per Fraction: 2.5 Gy
Plan Total Fractions Prescribed: 6
Plan Total Prescribed Dose: 15 Gy
Reference Point Dosage Given to Date: 30 Gy
Reference Point Session Dosage Given: 2.5 Gy
Session Number: 12

## 2023-12-17 LAB — CBC WITH DIFFERENTIAL (CANCER CENTER ONLY)
Abs Immature Granulocytes: 0.05 K/uL (ref 0.00–0.07)
Basophils Absolute: 0.1 K/uL (ref 0.0–0.1)
Basophils Relative: 1 %
Eosinophils Absolute: 0.1 K/uL (ref 0.0–0.5)
Eosinophils Relative: 2 %
HCT: 33.7 % — ABNORMAL LOW (ref 39.0–52.0)
Hemoglobin: 11 g/dL — ABNORMAL LOW (ref 13.0–17.0)
Immature Granulocytes: 1 %
Lymphocytes Relative: 9 %
Lymphs Abs: 0.7 K/uL (ref 0.7–4.0)
MCH: 28 pg (ref 26.0–34.0)
MCHC: 32.6 g/dL (ref 30.0–36.0)
MCV: 85.8 fL (ref 80.0–100.0)
Monocytes Absolute: 1 K/uL (ref 0.1–1.0)
Monocytes Relative: 13 %
Neutro Abs: 6.2 K/uL (ref 1.7–7.7)
Neutrophils Relative %: 74 %
Platelet Count: 261 K/uL (ref 150–400)
RBC: 3.93 MIL/uL — ABNORMAL LOW (ref 4.22–5.81)
RDW: 15.8 % — ABNORMAL HIGH (ref 11.5–15.5)
WBC Count: 8.1 K/uL (ref 4.0–10.5)
nRBC: 0 % (ref 0.0–0.2)

## 2023-12-17 LAB — PROTIME-INR
INR: 1 (ref 0.8–1.2)
Prothrombin Time: 13.9 s (ref 11.4–15.2)

## 2023-12-17 LAB — PREALBUMIN: Prealbumin: 12 mg/dL — ABNORMAL LOW (ref 18–38)

## 2023-12-17 MED ORDER — CARMEX CLASSIC LIP BALM EX OINT
TOPICAL_OINTMENT | CUTANEOUS | Status: DC | PRN
  Filled 2023-12-17: qty 10

## 2023-12-17 MED ORDER — LACTATED RINGERS IV SOLN: INTRAVENOUS | Status: AC

## 2023-12-17 MED ORDER — FLUCONAZOLE IN SODIUM CHLORIDE 200-0.9 MG/100ML-% IV SOLN
200.0000 mg | INTRAVENOUS | Status: DC
Start: 2023-12-17 — End: 2023-12-23
  Administered 2023-12-17 – 2023-12-22 (×7): 200 mg via INTRAVENOUS
  Filled 2023-12-17 (×7): qty 100

## 2023-12-17 NOTE — Progress Notes (Signed)
 Initial Nutrition Assessment  DOCUMENTATION CODES:   Severe malnutrition in context of chronic illness  INTERVENTION:   Monitor magnesium , potassium, and phosphorus daily for at least 3 days, MD to replete as needed, as pt is at risk for refeeding syndrome.  -Recommend Thiamine 100 mg daily for 5-7 days   Once PEG placed and ready to use: -Initiate Osmolite 1.5 @ 20 ml/hr, advance by 10 ml every 24 hours to goal rate of 65 ml/hr. -Provides 2340 kcals, 97g protein and 1188 ml H2O  NUTRITION DIAGNOSIS:   Severe Malnutrition related to cancer and cancer related treatments, chronic illness as evidenced by severe fat depletion, severe muscle depletion, energy intake < or equal to 75% for > or equal to 1 month, percent weight loss.  GOAL:   Patient will meet greater than or equal to 90% of their needs  MONITOR:   TF tolerance  REASON FOR ASSESSMENT:   Consult Assessment of nutrition requirement/status, Enteral/tube feeding initiation and management  ASSESSMENT:   89 year old Caucasian male with a history of non-Hodgkin's lymphoma with recurrence now transformed into B-cell lymphoma of his head neck who has been undergoing external beam radiation for the last 3 weeks.  He has developed mucositis.  He has lost about 30 pounds in the last 2 to 3 months.  He is unable to eat and drink due to pain.  Was seen in the cancer office today and directly admitted to the hospital to have a gastrostomy tube placed.  Patient  in room, very talkative and upset about situation. Oncology present in room. Pt reports he initially didn't want a tube but was convinced to have PEG tube placed given he cannot eat at all d/t worsening mucositis and pain. Eating began to decrease once he began chemotherapy and radiation. Received chemotherapy last on 8/29. Pt states he last ate a saltine with peanut butter a few days ago and it took him an hour to eat. Pt continues to make comments about his age and not sure  he will have support at home. Per oncology, will pursue SNF for patient. Pt with 2 sons and a daughter.  IR consulted for PEG tube placement, will monitor for plan. Will leave tube feeding recommendations above for when tube is ready to use.  RD concerned that pt will not be able to administer his own feeds but can trial at hospital.  May need to remain on continuous infusion given weakness. Will see after tube placement.  Per weight records, pt has lost 29 lbs since 8/26 (17% wt loss x 1.5 months, significant for time frame).  Medications: Lactated ringers   Labs reviewed.  NUTRITION - FOCUSED PHYSICAL EXAM:  Flowsheet Row Most Recent Value  Orbital Region Severe depletion  Upper Arm Region Severe depletion  Thoracic and Lumbar Region Severe depletion  Buccal Region Unable to assess  [swollen, red from cancer]  Temple Region Severe depletion  Clavicle Bone Region Severe depletion  Clavicle and Acromion Bone Region Severe depletion  Scapular Bone Region Severe depletion  Dorsal Hand Severe depletion  Patellar Region Mild depletion  Anterior Thigh Region Mild depletion  Posterior Calf Region Unable to assess  [compression socks]  Edema (RD Assessment) Mild  [BLEs]  Hair Reviewed  Eyes Reviewed  Mouth Reviewed  [yellow secretions, mucositis]  Skin Reviewed  Nails Reviewed    Diet Order:   Diet Order             Diet NPO time specified  Diet effective now  EDUCATION NEEDS:   Education needs have been addressed  Skin:  Skin Assessment: Reviewed RN Assessment  Last BM:  PTA  Height:   Ht Readings from Last 1 Encounters:  11/14/23 5' 6 (1.676 m)    Weight:   Wt Readings from Last 1 Encounters:  12/17/23 61.6 kg    BMI:  21.90 kg/m^2  Estimated Nutritional Needs:   Kcal:  1850-2150  Protein:  95-110g  Fluid:  2.1L/day   Morna Lee, MS, RD, LDN Inpatient Clinical Dietitian Contact via Secure chat

## 2023-12-17 NOTE — Assessment & Plan Note (Addendum)
 12/17/23 admit to med/surg bed. IR consult for g-tube placement. Pt thinks his last dose of eliquis  was on Sunday. But he thinks he may have skipped it for the last week due to mucositis. Regardless, it has been at least 48 hours since his last dose. Dietician consult for nutritional status assessment and tube feed recommendations after his g-tube has been placed.

## 2023-12-17 NOTE — Progress Notes (Cosign Needed)
 Harold [name]   DOB:10-20-1934   FM#:[id number]      ASSESSMENT & PLAN:  Darcell Sabino. Mcgath is an 89 year old male patient with oncologic history significant for follicular lymphoma.  He is admitted 12/17/23 for failure to thrive, severe radiation mucositis.  Dr. Kale/Oncology following.   Failure to Thrive Weight loss  Radiation mucositis, severe -- Complains of mouth pain and inability to eat.  Continues to lose weight. -- Needs G-tube for nutrition, IR order placed. - Dietitian eval in progress -- Dr. 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  --LN biopsy of right axilla done 6/11, showed low grade FL. - Facial mass biopsy done 7/30.  Morphologic features consistent with DLBCL.  -- CT neck 7/29 showed very large up to 12 cm lymphomatous mass about the right mandible, no other lymphadenopathy in the neck or thoracic inlet.  - Chemotherapy regimen R-CEOP started on 10/23/23. IV Rituximab  given outpatient. Status post 2 cycles. Mass continued to grow despite this regimen.   -- Port was previously removed due to bleeding.  -- Recommended palliative radiation therapy as mass appears refractory to above chemotherapy regimen. - Consideration for alternative systemic therapies after radiation therapy is completed.    - Medical oncology/Dr. [name] following closely.    Chronic bilateral lower extremity edema - Compression stockings in use, patient tolerating well - Continue supportive care  Anemia -- Mild -- Hemoglobin 11.0 -- Continue to monitor CBC with differential   Code Status DNR-Limited  Subjective:  Patient seen awake and alert sitting up in bed.  Right-sided facial and neck mass appears erythematous.  Patient reports that it is difficult to swallow and he has to do various facial  maneuvers in order to swallow.  States he is tired of this ongoing cancer and may be interested in not doing any more treatment.  Patient is open to G-tube placement.  Objective:  No intake or output data in the 24 hours ending 12/17/23 1249   PHYSICAL EXAMINATION: ECOG PERFORMANCE STATUS: 3 - Symptomatic, >50% confined to bed  There were no vitals filed for this visit. There were no vitals filed for this visit.  GENERAL: alert,  + frail + cachectic + chronically ill-appearing SKIN: + Large right-sided erythematous facial/cervical mass EYES: normal, conjunctiva are pink and non-injected, sclera clear OROPHARYNX: no exudate, no erythema and lips, buccal mucosa, and tongue normal  NECK: supple, thyroid  normal size, non-tender, without nodularity LYMPH: no palpable lymphadenopathy in the cervical, axillary or inguinal LUNGS: clear to auscultation and percussion with normal breathing effort HEART: regular rate & rhythm and no murmurs and + bilateral 2+ lower extremity edema ABDOMEN: abdomen soft, non-tender and normal bowel sounds MUSCULOSKELETAL: no cyanosis of digits and no clubbing  PSYCH: alert & oriented x 3 with fluent speech NEURO: no focal motor/sensory deficits   All questions were answered. The patient knows to call the clinic with any problems, questions or concerns.   The total time spent in the appointment was 40 minutes encounter with patient including review of chart and various tests results, discussions about plan of care and coordination of care plan  [name], NP 12/17/2023 12:49 PM    Labs Reviewed:  Lab Results  Component Value Date   WBC 8.1 12/17/2023   HGB 11.0 (L) 12/17/2023   HCT 33.7 (L) 12/17/2023   MCV 85.8 12/17/2023  PLT 261 12/17/2023   Recent Labs    11/10/23 0948 11/25/23 1104 12/17/23 0907  NA 143 143 139  K 4.0 3.7 3.7  CL 105 104 98  CO2 31 33* 32  GLUCOSE 79 83 92  BUN 17 17 18   CREATININE 0.73 0.89 0.80  CALCIUM 8.7* 9.0  9.3  GFRNONAA >60 >60 >60  PROT 5.6* 5.8* 6.2*  ALBUMIN 3.6 3.8 4.0  AST 13* 13* 14*  ALT 8 6 7   ALKPHOS 81 93 52  BILITOT 0.4 0.3 0.8    Studies Reviewed:  No results found.   ADDENDUM  .Patient was Personally and independently interviewed, examined and relevant elements of the history of present illness were reviewed in details and an assessment and plan was created. All elements of the patient's history of present illness , assessment and plan were discussed in details with [name], NP. The above documentation reflects our combined findings assessment and plan. Patient with significant grade 2-3 radiation mucositis with poor p.o. intake and some swallowing difficulties.  Unable to eat anything and has lost 10 to 15 pounds over the last couple of weeks. Right facial and neck swelling has improved with radiation and continues to improve. We discussed that this will take potentially 4 to 8 weeks to improve and he will need a feeding tube. He is failing to thrive at home and his daughter has not been able to take care of him.  After detailed goals of care discussion patient is open to the idea of tube feeding and symptom control and discharge to rehabilitation facility rehab. He will see how he is doing in about 1 to 2 months and decide if he would want any other additional lymphoma treatments. He has significant oral thrush which is also being treated with IV fluconazole. Appreciate excellent hospital medicine cares and IR input.  Gautam Kale MD MS

## 2023-12-17 NOTE — Assessment & Plan Note (Addendum)
 12/17/23 confirmed his desire for DNR/DNI status. DNR form completed

## 2023-12-17 NOTE — Assessment & Plan Note (Signed)
 12/17/23 chronic. Pt has +2 bilateral pitting pretibial, ankle and pedal edema. Both dtr, pt and son-in-law all state that current level of edema is actually better than normal for patient. May benefit from ACE bandage compression wraps.

## 2023-12-17 NOTE — Assessment & Plan Note (Addendum)
 12/17/23 Eliquis  on hold for his g-tube. Can restart once his g-tube has been placed. SCDs for now.

## 2023-12-17 NOTE — Plan of Care (Signed)
  Problem: Education: Goal: Knowledge of General Education information will improve Description: Including pain rating scale, medication(s)/side effects and non-pharmacologic comfort measures Outcome: Progressing   Problem: Health Behavior/Discharge Planning: Goal: Ability to manage health-related needs will improve Outcome: Progressing   Problem: Clinical Measurements: Goal: Ability to maintain clinical measurements within normal limits will improve Outcome: Progressing   Problem: Elimination: Goal: Will not experience complications related to urinary retention Outcome: Progressing   Problem: Pain Managment: Goal: General experience of comfort will improve and/or be controlled Outcome: Progressing   Problem: Safety: Goal: Ability to remain free from injury will improve Outcome: Progressing

## 2023-12-17 NOTE — Progress Notes (Signed)
 HEMATOLOGY/ONCOLOGY CLINIC NOTE  Date of Service: .SABRA10/03/2023   Patient Care Team: [name], MD as PCP - General (Family Medicine) Verlin Lonni BIRCH, MD as PCP - Cardiology (Cardiology)  CHIEF COMPLAINTS/PURPOSE OF CONSULTATION:  Follow-up for continued valuation and management of transformed follicular lymphoma   HISTORY OF PRESENTING ILLNESS:  please see previous notes for details on initial presentation.   INTERVAL HISTORY:   Harold [name] is a wonderful 89 y.o. male is here with his daughter for continued evaluation and management of his transformed follicular lymphoma.  Accompanied by his daughter and present in a wheel chair. She states he is not taking his antibiotics regularly.  She mentions he has issues with his taste on significant mouth discomfort due to radiation to his right facial/right neck mass.   He states he did not take his antibiotics since he has had previous antibiotic related diarrhea.   2 more doses of radiation remaining which patient wants to hold off on Inflammation in mouth from radiation and food pipe  He mentions throat is stopped up , can not eat or swallow difficulty swallowing, it is a thick green mucus secretion . Causing choking sensation   He mentions occasional bleeding when cleaning  mouth.   Magic mouthwash, antifungals and antibiotic can be in liquid form to aid inflammations - can take 1-2 months to improve  Thick secretion is being created due to radiation and irritation. It takes 4-6 weeks to heal  MEDICAL HISTORY:  Past Medical History:  Diagnosis Date   BPH (benign prostatic hypertrophy)    CAD (coronary artery disease) 01/2010   S/P drug eluting stents LAD and PL branch of [facility] Dr. 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   follicular lymphoma dx'd 06/2017   Hernia    HLD (hyperlipidemia)    HTN (hypertension)     SURGICAL HISTORY: Past Surgical History:   Procedure Laterality Date   BACK SURGERY     CORONARY ANGIOGRAPHY N/A 01/29/2023   Procedure: CORONARY ANGIOGRAPHY;  Surgeon: Verlin Lonni BIRCH, MD;  Location: [facility];  Service: Cardiovascular;  Laterality: N/A;   CORONARY BALLOON ANGIOPLASTY N/A 01/29/2023   Procedure: CORONARY BALLOON ANGIOPLASTY;  Surgeon: Verlin Lonni BIRCH, MD;  Location: [facility];  Service: Cardiovascular;  Laterality: N/A;   CORONARY LITHOTRIPSY N/A 01/29/2023   Procedure: CORONARY LITHOTRIPSY;  Surgeon: Verlin Lonni BIRCH, MD;  Location: [facility];  Service: Cardiovascular;  Laterality: N/A;   CYSTOSCOPY WITH LITHOLAPAXY N/A 05/20/2019   Procedure: CYSTOSCOPY WITH LITHOLAPAXY;  Surgeon: Cam Morene ORN, MD;  Location: [facility];  Service: Urology;  Laterality: N/A;   CYSTOSCOPY/URETEROSCOPY/HOLMIUM LASER/STENT PLACEMENT Left 05/20/2019   Procedure: CYSTOSCOPY/URETEROSCOPY/HOLMIUM LASER/STENT PLACEMENT;  Surgeon: Cam Morene ORN, MD;  Location: [facility];  Service: Urology;  Laterality: Left;   HERNIA REPAIR  01/08/11   RIH   IR IMAGING GUIDED PORT INSERTION  10/15/2023   IR REMOVAL TUN ACCESS W/ PORT W/O FL MOD SED  10/23/2023   IR US  GUIDE BX ASP/DRAIN  10/15/2023   MASS EXCISION Right 08/05/2017   Procedure: EXCISION RIGHT POSTERIOR  NECK LYMPH NODE;  Surgeon: Ethyl Lonni BRAVO, MD;  Location: [facility];  Service: ENT;  Laterality: Right;    SOCIAL HISTORY: Social History   Socioeconomic History   Marital status: Married    Spouse name: Not on file   Number of children: Not on file  Years of education: Not on file   Highest education level: Not on file  Occupational History   Not on file  Tobacco Use   Smoking status: Never   Smokeless tobacco: Never  Vaping Use   Vaping status: Never Used  Substance and Sexual Activity   Alcohol use: No   Drug use: No   Sexual activity: Not on file  Other Topics Concern   Not on file  Social History  Narrative   Married   Social Drivers of Health   Financial Resource Strain: Low Risk  (11/17/2022)   Received from [facility]   Overall Financial Resource Strain (CARDIA)    Difficulty of Paying Living Expenses: Not very hard  Food Insecurity: No Food Insecurity (10/18/2023)   Hunger Vital Sign    Worried About Running Out of Food in the Last Year: Never true    Ran Out of Food in the Last Year: Never true  Transportation Needs: No Transportation Needs (10/18/2023)   PRAPARE - Administrator, Civil Service (Medical): No    Lack of Transportation (Non-Medical): No  Physical Activity: Inactive (11/17/2022)   Received [facility]   Exercise Vital Sign    On average, how many days per week do you engage in moderate to strenuous exercise (like a brisk walk)?: 0 days    On average, how many minutes do you engage in exercise at this level?: 0 min  Stress: Stress Concern Present (11/17/2022)   Received from [facility] - Occupational Stress Questionnaire    Feeling of Stress : Rather much  Social Connections: Moderately Integrated (10/18/2023)   Social Connection and Isolation Panel    Frequency of Communication with Friends and Family: Three times a week    Frequency of Social Gatherings with Friends and Family: Once a week    Attends Religious Services: 1 to 4 times per year    Active Member of [employer] or Organizations: Not on file    Attends Banker Meetings: 1 to 4 times per year    Marital Status: Widowed  Intimate Partner Violence: Not At Risk (10/18/2023)   Humiliation, Afraid, Rape, and Kick questionnaire    Fear of Current or Ex-Partner: No    Emotionally Abused: No    Physically Abused: No    Sexually Abused: No    FAMILY HISTORY: Family History  Problem Relation Age of Onset   Heart failure Mother     ALLERGIES:  has no known allergies.  MEDICATIONS:  Current Outpatient Medications  Medication  Sig Dispense Refill   acetaminophen  (TYLENOL ) 325 MG tablet Take 650 mg by mouth daily as needed for mild pain (pain score 1-3) or moderate pain (pain score 4-6).     acyclovir  (ZOVIRAX ) 400 MG tablet Take 1 tablet (400 mg total) by mouth daily. 30 tablet 3   allopurinol  (ZYLOPRIM ) 100 MG tablet Take 1 tablet (100 mg total) by mouth 2 (two) times daily. 60 tablet 2   amoxicillin -clavulanate (AUGMENTIN ) 875-125 MG tablet Take 1 tablet by mouth 2 (two) times daily. 20 tablet 0   apixaban  (ELIQUIS ) 5 MG TABS tablet Take 5 mg by mouth 2 (two) times daily.     aspirin  EC 81 MG tablet Take 1 tablet (81 mg total) by mouth daily. Swallow whole.     atenolol  (TENORMIN ) 25 MG tablet Take 0.5 tablets (12.5 mg total) by mouth daily. 30 tablet 0  brimonidine  (ALPHAGAN ) 0.2 % ophthalmic solution Place 1 drop into the right eye daily.     cholecalciferol  (VITAMIN D ) 1000 UNITS tablet Take 2,000 Units by mouth daily.     cyanocobalamin  (VITAMIN B12) 1000 MCG tablet Take 1,000 mcg by mouth daily.     docusate sodium  (COLACE) 100 MG capsule Take 1 capsule (100 mg total) by mouth 2 (two) times daily as needed for mild constipation. 30 capsule 0   etoposide  (VEPESID ) 50 MG capsule Take 200 mg by mouth daily. (4 capsules) Day 2 and Day 3 of each cycle of R-CEOP.     finasteride  (PROSCAR ) 5 MG tablet Take 5 mg by mouth daily.     furosemide  (LASIX ) 20 MG tablet TAKE 1 TABLET BY MOUTH TWICE  DAILY 180 tablet 3   gabapentin  (NEURONTIN ) 100 MG capsule Take 100 mg by mouth daily as needed (for knee pain).     lidocaine -prilocaine  (EMLA ) cream Apply to affected area once 30 g 3   nitroGLYCERIN  (NITROSTAT ) 0.4 MG SL tablet Place 1 tablet (0.4 mg total) under the tongue every 5 (five) minutes as needed for chest pain. 25 tablet 5   omeprazole (PRILOSEC) 40 MG capsule Take 40 mg by mouth daily.     ondansetron  (ZOFRAN ) 8 MG tablet Take 1 tablet (8 mg total) by mouth every 8 (eight) hours as needed for nausea or vomiting.  Start on day 3 after cyclophosphamide  chemotherapy. 30 tablet 1   oxyCODONE  (OXY IR/ROXICODONE ) 5 MG immediate release tablet Take 1 tablet (5 mg total) by mouth every 4 (four) hours as needed for severe pain (pain score 7-10). 30 tablet 0   pravastatin  (PRAVACHOL ) 40 MG tablet Take 1 tablet (40 mg total) by mouth at bedtime. 90 tablet 3   predniSONE  (DELTASONE ) 20 MG tablet Take 20 mg by mouth as directed. Take 3 tablets (60 mg total) by mouth daily. Take on days 2-5 of chemotherapy.     prochlorperazine  (COMPAZINE ) 10 MG tablet Take 1 tablet (10 mg total) by mouth every 6 (six) hours as needed for nausea or vomiting. 30 tablet 6   sucralfate (CARAFATE) 1 g tablet Take 1 g by mouth in the morning, at noon, and at bedtime.     No current facility-administered medications for this visit.    REVIEW OF SYSTEMS:   .10 Point review of Systems was done is negative except as noted above.  PHYSICAL EXAMINATION: ECOG FS:2 - Symptomatic, <50% confined to bed  Vitals:   12/17/23 1008  BP: (!) 110/55  Pulse: 62  Resp: 18  Temp: 98.4 F (36.9 C)  SpO2: 100%     Wt Readings from Last 3 Encounters:  11/25/23 153 lb 11.2 oz (69.7 kg)  11/14/23 161 lb 6 oz (73.2 kg)  11/11/23 165 lb (74.8 kg)   Body mass index is 21.9 kg/m.    GENERAL:alert, in no acute distress and comfortable SKIN: no acute rashes, no significant lesions EYES: conjunctiva are pink and non-injected, sclera anicteric OROPHARYNX: MMM NECK: supple, large rt neck/face mass. LYMPH:  no palpable lymphadenopathy in the cervical, axillary or inguinal regions LUNGS: clear to auscultation b/l with normal respiratory effort HEART: regular rate & rhythm ABDOMEN:  normoactive bowel sounds , non tender, not distended. Extremity: no pedal edema PSYCH: alert & oriented x 3 with fluent speech NEURO: no focal motor/sensory deficits   LABORATORY DATA:  I have reviewed the data as listed     Latest Ref Rng & Units 12/17/2023  9:16  AM 11/25/2023   11:04 AM 11/10/2023    9:48 AM  CBC  WBC 4.0 - 10.5 K/uL 8.1  19.7  12.2   Hemoglobin 13.0 - 17.0 g/dL 88.9  9.3  9.8   Hematocrit 39.0 - 52.0 % 33.7  29.6  30.9   Platelets 150 - 400 K/uL 261  193  235        Latest Ref Rng & Units 12/17/2023    1:47 PM 12/17/2023    9:07 AM 11/25/2023   11:04 AM  CMP  Glucose 70 - 99 mg/dL 79  92  83   BUN 8 - 23 mg/dL 17  18  17    Creatinine 0.61 - 1.24 mg/dL 9.28  9.19  9.10   Sodium 135 - 145 mmol/L 139  139  143   Potassium 3.5 - 5.1 mmol/L 3.9  3.7  3.7   Chloride 98 - 111 mmol/L 97  98  104   CO2 22 - 32 mmol/L 26  32  33   Calcium 8.9 - 10.3 mg/dL 9.6  9.3  9.0   Total Protein 6.5 - 8.1 g/dL 5.9  6.2  5.8   Total Bilirubin 0.0 - 1.2 mg/dL 0.8  0.8  0.3   Alkaline Phos 38 - 126 U/L 58  52  93   AST 15 - 41 U/L 18  14  13    ALT 0 - 44 U/L 7  7  6     Lab Results  Component Value Date   LDH 193 (H) 09/29/2023      08/27/2023 Surgical Pathology:      08/27/2023 Surgical Pathology:        02/18/2019 CT Abdomen Pelvis W Contrast (Accession [id number]):   02/18/2019 CT Chest W Contrast (Accession [id number]):  08/08/17 Pathology:   08/05/17 Pathology:     RADIOGRAPHIC STUDIES: I have personally reviewed the radiological images as listed and agreed with the findings in the report. No results found.     ASSESSMENT & PLAN:   89 y.o. 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 No abdominal or pelvic involvement.    03/20/18 CT C/A/P revealed Progressive bilateral axillary and right inguinal lymphadenopathy, including necrotic lymph nodes in the right axilla, as above. Spleen is normal in size. 4 mm distal left  ureteral calculus, unchanged. No hydronephrosis. Stable layering bladder calculi measuring up to 5 mm. Cholelithiasis, without associated inflammatory changes. Stable sequela of prior/chronic pancreatitis.   11/03/2018 CT neck revealed Marked interval progression of cervical lymphadenopathy since PET-CT 09/02/2017, now with lymphadenopathy present bilaterally at essentially all stations. Index nodes as described. This includes bilateral intraparotid lymphadenopathy. Bulky right-sided adenopathy results in multifocal narrowing of the right internal jugular vein. Significant narrowing of the mid to lower left internal jugular vein of unclear cause as there is no significant mass effect at this level.  11/03/2018 CT CAP revealed 1. Progression of bilateral axillary, retroperitoneal, pelvic, and inguinal adenopathy. New anterior and posterior lower cervical adenopathy identified within the neck. 2. Persistent distal left ureteral calculus. Small bladder calculi are unchanged. 3. Aortic Atherosclerosis (ICD10-I70.0). Coronary artery calcifications. 4. Sequelae of chronic pancreatitis noted.  10/01/2019 CT Abd/Pel ([id number]) revealed no evidence of mass in right kidney, new and enlarged abdominal  and pelvic lymph nodes.   2.  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 3. H/o head and neck Basal cell carcinoma Continue routine skin checks with dermatologist    4. Renal indeterminate lesion -06/28/19 Renal US  ([id number]) revealed New right-sided mass at the hilum which measures 1.5cm in size.   -resolved on subsequent scans   5.  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 PLAN: -Discussed lab results from today in detail with the patient - We discussed that he has very severe grade 3-4 radiation mucositis which has limited p.o. food and fluid  intake and he is quite dehydrated and is showing signs of failure to thrive.  He is also very uncomfortable with his oral pain.  We discussed that his right facial neck mass has shrunk and will continue to shrink for the next 1 to 2 months with his radiation mucositis may take several weeks to improve.  We spent significant time discussing goals of care and the fact that his failure to thrive means he needs much more help.  We discussed being admitted to the hospital for symptom management and possible placement of feeding tube and defining further goals of care to determine if he would want to consider a feeding tube and then go through rehabilitation at a skilled nursing facility or if he would prefer to follow-up and set up hospice for best supportive cares.  Patient is agreeable to be admitted.  He also has overt thrush and will likely need IV antifungals and antibiotics as well as Magic mouthwash with lidocaine  and potentially other narcotic pain medications.  He will also be considered for feeding tube placement by IR.  I do not think we have palifermin but is available we will consider giving him palifermin.  Acyclovir  and allopurinol  can be stopped  Discussed with hospitalist patient has been accepted for direct admission for failure to thrive symptom control and define goals of care for further management.  Patient's daughter is present during the conversation and is agreeable with this plan of treatment FOLLOW-UP: Admit to hospital  The total time spent in the appointment was 32 minutes*.  All of the patient's questions were answered with apparent satisfaction. The patient knows to call the clinic with any problems, questions or concerns.   Emaline Saran MD MS AAHIVMS [facility] Hematology/Oncology Physician [facility]  .*Total Encounter Time as defined by the Centers for Medicare and Medicaid Services includes, in addition to the face-to-face time of a patient  visit (documented in the note above) non-face-to-face time: obtaining and reviewing outside history, ordering and reviewing medications, tests or procedures, care coordination (communications with other health care professionals or caregivers) and documentation in the medical record.

## 2023-12-17 NOTE — Assessment & Plan Note (Addendum)
 12/17/23 prn IV labetalol 

## 2023-12-17 NOTE — Assessment & Plan Note (Signed)
 12/17/23 not exacerbated. Hold lasix  for now.

## 2023-12-17 NOTE — H&P (Signed)
 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  He has developed mucositis.  He has lost about 30 pounds in the last 2 to 3 months.  He is unable to eat and drink due to pain.  Was seen in the cancer office today and directly admitted to the hospital to have a gastrostomy tube placed.  Patient states he has had mouth pain and inability to eat ever since he started external beam radiation.  He states food does not taste good.  He has been having difficulty swallowing and controlling his secretions due to mucositis.  Denies any fever or chills.  He has chronic lower extremity edema almost lymphedema.  He has 2-3+ pitting edema of his lower legs.  However both the patient and his daughter state that his current level of edema at this point is actually quite good.  He is on Lasix  daily.  He has a history of heart failure.  Patient currently does not have a Port-A-Cath as this was taken out due to bleeding.  Patient was seen in Dr. Maryla office due to his mucositis and directly admitted to the hospital.  Patient is agreeable to feeding tube placement.  He states that he wants to be a DO NOT RESUSCITATE.  Significant Events: Admitted 12/17/2023 for adult failure to thrive   Admission Labs:   Admission Imaging Studies:   Significant Labs:   Significant Imaging Studies:   Antibiotic Therapy: Anti-infectives (From admission, onward)    Start     Dose/Rate Route Frequency Ordered Stop   12/17/23 1300   fluconazole (DIFLUCAN) IVPB 200 mg        200 mg 100 mL/hr over 60 Minutes Intravenous Every 24 hours 12/17/23 1210         Procedures:   Consultants: Oncology IR   Review of Systems:  Review of Systems  Constitutional:  Positive for weight loss.       Has lost about 30 lbs in 3-6 months  HENT:         Oral mouth pain. Difficulty swallowing. Difficulty handling oral secretions  Respiratory: Negative.    Cardiovascular:  Positive for leg swelling.       Chronic +2 pitting LE edema.  Gastrointestinal: Negative.   Genitourinary: Negative.   Musculoskeletal: Negative.   Neurological: Negative.   Endo/Heme/Allergies: Negative.   Psychiatric/Behavioral: Negative.    All other systems reviewed and are negative.   Past Medical History:  Diagnosis Date   Acute on chronic diastolic heart failure (HCC) 05/29/2022   BPH (benign prostatic hypertrophy)    CAD (coronary artery disease) 01/2010   S/P drug eluting stents LAD and PL branch of RCA Dr. 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   follicular lymphoma dx'd 06/2017   Hernia    HLD (hyperlipidemia)    HTN (hypertension)    NSTEMI (non-ST elevated myocardial infarction) (HCC) 01/28/2023   S/P  PTCA (percutaneous transluminal coronary angioplasty) 01/30/2023    Past Surgical History:  Procedure Laterality Date   BACK SURGERY     CORONARY ANGIOGRAPHY N/A 01/29/2023   Procedure: CORONARY ANGIOGRAPHY;  Surgeon: Verlin Lonni BIRCH, MD;  Location: [facility];  Service: Cardiovascular;  Laterality: N/A;   CORONARY BALLOON ANGIOPLASTY N/A 01/29/2023   Procedure: CORONARY BALLOON ANGIOPLASTY;  Surgeon: Verlin Lonni BIRCH, MD;  Location: [facility];  Service: Cardiovascular;  Laterality: N/A;   CORONARY LITHOTRIPSY N/A 01/29/2023   Procedure: CORONARY LITHOTRIPSY;  Surgeon: Verlin Lonni BIRCH, MD;  Location: [facility];  Service: Cardiovascular;  Laterality: N/A;   CYSTOSCOPY WITH LITHOLAPAXY N/A 05/20/2019   Procedure: CYSTOSCOPY WITH LITHOLAPAXY;  Surgeon: Cam Morene ORN, MD;  Location: [facility];  Service: Urology;  Laterality: N/A;   CYSTOSCOPY/URETEROSCOPY/HOLMIUM LASER/STENT PLACEMENT Left 05/20/2019   Procedure: CYSTOSCOPY/URETEROSCOPY/HOLMIUM LASER/STENT PLACEMENT;  Surgeon: Cam Morene ORN, MD;  Location: [facility];  Service: Urology;  Laterality: Left;   HERNIA REPAIR  01/08/11   RIH   IR IMAGING GUIDED PORT INSERTION  10/15/2023   IR REMOVAL TUN ACCESS W/ PORT W/O FL MOD SED  10/23/2023   IR US  GUIDE BX ASP/DRAIN  10/15/2023   MASS EXCISION Right 08/05/2017   Procedure: EXCISION RIGHT POSTERIOR  NECK LYMPH NODE;  Surgeon: Ethyl Lonni BRAVO, MD;  Location: [facility];  Service: ENT;  Laterality: Right;     reports that he has never smoked. He has never used smokeless tobacco. He reports that he does not drink alcohol and does not use drugs.  No Known Allergies  Family History  Problem Relation Age of Onset   Heart failure Mother     Prior to Admission medications   Medication Sig Start Date End Date Taking? Authorizing Provider  acetaminophen  (TYLENOL ) 325 MG tablet Take 650 mg by mouth daily as needed for mild pain (pain score 1-3) or moderate pain (pain score 4-6).    [provider]  acyclovir  (ZOVIRAX ) 400 MG tablet Take 1 tablet (400 mg total) by mouth daily. 10/15/23   Kale, Gautam Kishore, MD  allopurinol  (ZYLOPRIM ) 100 MG tablet Take 1 tablet (100 mg total) by mouth 2 (two) times daily. 11/26/23   [name], MD  amoxicillin -clavulanate (AUGMENTIN ) 875-125 MG tablet Take 1 tablet by mouth 2 (two) times daily. 11/10/23   [name], MD  apixaban  (ELIQUIS ) 5 MG TABS tablet Take 5 mg by mouth 2 (two) times daily.    [provider]  aspirin  EC 81 MG tablet Take 1 tablet (81 mg total) by mouth daily.  Swallow whole. 10/26/23   Cindy Garnette POUR, MD  atenolol  (TENORMIN ) 25 MG tablet Take 0.5 tablets (12.5 mg total) by mouth daily. 10/26/23   Cindy Garnette POUR, MD  brimonidine  (ALPHAGAN ) 0.2 % ophthalmic solution Place 1 drop into the right eye daily.    [provider]  cholecalciferol  (VITAMIN D ) 1000 UNITS tablet Take 2,000 Units by mouth daily.    [provider]  cyanocobalamin  (VITAMIN B12) 1000 MCG tablet Take 1,000 mcg by mouth daily.    [provider]  docusate sodium  (COLACE) 100 MG capsule Take 1 capsule (100 mg total) by mouth 2 (two) times daily as needed for mild constipation. 10/25/23   Cindy Garnette POUR, MD  etoposide  (VEPESID ) 50 MG capsule Take 200 mg by mouth daily. (4 capsules) Day 2 and Day 3 of each cycle of R-CEOP.    [provider]  finasteride  (PROSCAR ) 5 MG tablet Take 5 mg by mouth daily.    [provider]  furosemide  (LASIX ) 20 MG tablet TAKE 1 TABLET BY MOUTH TWICE  DAILY 02/19/22   Parthenia Olivia HERO, PA-C  gabapentin  (NEURONTIN ) 100 MG capsule Take 100 mg by mouth daily as needed (for knee pain). 10/24/21   [provider]  lidocaine -prilocaine  (EMLA ) cream Apply to affected area once 10/15/23   [name], MD  nitroGLYCERIN  (NITROSTAT ) 0.4 MG SL tablet Place 1 tablet (0.4 mg total) under the tongue every 5 (five) minutes as needed for chest pain. 09/29/23 01/16/25  Verlin Lonni BIRCH, MD  omeprazole (PRILOSEC) 40 MG capsule Take 40 mg by mouth daily. 08/04/23   [provider]  ondansetron  (ZOFRAN ) 8 MG tablet Take 1 tablet (8 mg total) by mouth every 8 (eight) hours as needed for nausea or vomiting. Start on day 3 after cyclophosphamide  chemotherapy. 10/15/23   [name], MD  oxyCODONE  (OXY IR/ROXICODONE ) 5 MG immediate release tablet Take 1 tablet (5 mg total) by mouth every 4 (four) hours as needed for severe pain (pain score 7-10). 12/01/23   Neomi Johnston DASEN, PA-C  pravastatin  (PRAVACHOL ) 40 MG  tablet Take 1 tablet (40 mg total) by mouth at bedtime. 12/26/21   Verlin Lonni BIRCH, MD  predniSONE  (DELTASONE ) 20 MG tablet Take 20 mg by mouth as directed. Take 3 tablets (60 mg total) by mouth daily. Take on days 2-5 of chemotherapy.    [provider]  prochlorperazine  (COMPAZINE ) 10 MG tablet Take 1 tablet (10 mg total) by mouth every 6 (six) hours as needed for nausea or vomiting. 10/15/23   Kale, Gautam Kishore, MD  sucralfate (CARAFATE) 1 g tablet Take 1 g by mouth in the morning, at noon, and at bedtime.    [provider]    Physical Exam: There were no vitals filed for this visit.  Physical Exam Vitals and nursing note reviewed.  Constitutional:      General: He is not in acute distress.    Appearance: He is not toxic-appearing.     Comments: Appears chronically ill  HENT:     Head: Normocephalic and atraumatic.  Neck:     Comments: Large mass on right side of neck Cardiovascular:     Rate and Rhythm: Normal rate and regular rhythm.  Pulmonary:     Effort: Pulmonary effort is normal. No respiratory distress.  Abdominal:     General: Abdomen is flat. Bowel sounds are normal.     Palpations: Abdomen is soft.  Musculoskeletal:     Right lower leg: Edema present.     Left lower leg: Edema present.     Comments: +2 pitting bilateral edema of pretibial, ankles and feet  Skin:    General: Skin is warm and dry.     Capillary Refill: Capillary refill takes less than 2 seconds.  Neurological:     Mental Status: He is alert and oriented to person, place, and time.     Labs on Admission: I have personally reviewed following labs and imaging studies  CBC: Recent Labs  Lab 12/17/23 0916  WBC 8.1  NEUTROABS 6.2  HGB 11.0*  HCT 33.7*  MCV 85.8  PLT 261   Basic Metabolic Panel: Recent Labs  Lab 12/17/23 0907  NA 139  K 3.7  CL 98  CO2 32  GLUCOSE 92  BUN 18  CREATININE 0.80  CALCIUM 9.3   GFR: Estimated Creatinine Clearance:  56.7  mL/min (by C-G formula based on SCr of 0.8 mg/dL). Liver Function Tests: Recent Labs  Lab 12/17/23 0907  AST 14*  ALT 7  ALKPHOS 52  BILITOT 0.8  PROT 6.2*  ALBUMIN 4.0   BNP (last 3 results) Recent Labs    01/28/23 2326 10/18/23 0947 10/21/23 0752  BNP 482.6* 625.0* 165.3*    Assessment/Plan Principal Problem:   Failure to thrive in adult Active Problems:   Oral mucositis due to radiation therapy   HYPERTENSION, BENIGN ESSENTIAL   Chronic diastolic CHF (congestive heart failure) (HCC)   History of DVT (deep vein thrombosis)   Diffuse large B-cell lymphoma of lymph nodes of multiple regions (HCC)   DNR (do not resuscitate)/DNI(Do Not Intubate)   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 IR consult for g-tube placement. Pt thinks his last dose of eliquis  was on Sunday. But he thinks he may have skipped it for the last week due to mucositis. Regardless, it has been at least 48 hours since his last dose. Dietician consult for nutritional status assessment and tube feed recommendations after his g-tube has been placed.   Oral mucositis due to radiation therapy 12/17/23 start IV diflucan.  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 Pt has +2 bilateral pitting pretibial, ankle and pedal edema. Both dtr, pt and son-in-law all state that current level of edema is actually better than normal for patient. May benefit from ACE bandage compression wraps.   DNR (do not resuscitate)/DNI(Do Not Intubate) 12/17/23 confirmed his desire for DNR/DNI status. DNR form completed    Diffuse large B-cell lymphoma of lymph nodes of multiple regions (HCC) 12/17/23 dtr states pt has failed chemotherapy. Pt has been getting XRT for last 2-3 weeks. Oncology consulted.  History of DVT (deep vein thrombosis) 12/17/23 Eliquis  on hold for his g-tube. Can restart once his g-tube has been  placed. SCDs for now.  Chronic diastolic CHF (congestive heart failure) (HCC) 12/17/23 not exacerbated. Hold lasix  for now.   HYPERTENSION, BENIGN ESSENTIAL 12/17/23 prn IV labetalol    DVT prophylaxis: SCDs Code Status: DNR/DNI(Do NOT Intubate). Verified with pt at bedside. Dtr and son-in-law present during conversation Family Communication: discussed with dtr debbie and son-in-law Rydell at bedside. Pt is completely decisional.  Disposition Plan: unknown. SNF vs home with home health  Consults called: IR  Admission status: Inpatient, Med-Surg   Camellia Door, DO [facility] 12/17/2023, 12:39 PM

## 2023-12-17 NOTE — Hospital Course (Signed)
 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  He has developed mucositis.  He has lost about 30 pounds in the last 2 to 3 months.  He is unable to eat and drink due to pain.  Was seen in the cancer office today and directly admitted to the hospital to have a gastrostomy tube placed.  Patient states he has had mouth pain and inability to eat ever since he started external beam radiation.  He states food does not taste good.  He has been having difficulty swallowing and controlling his secretions due to mucositis.  Denies any fever or chills.  He has chronic lower extremity edema almost lymphedema.  He has 2-3+ pitting edema of his lower legs.  However both the patient and his daughter state that his current level of edema at this point is actually quite good.  He is on Lasix  daily.  He has a history of heart failure.  Patient currently does not have a Port-A-Cath as this was taken out due to bleeding.  Patient was seen in Dr. Maryla office due to his mucositis and directly admitted to the hospital.  Patient is agreeable to feeding tube placement.  He states that he wants to be a DO NOT RESUSCITATE.  Significant Events: Admitted 12/17/2023 for adult failure to thrive   Admission Labs:   Admission Imaging Studies:   Significant Labs:   Significant Imaging Studies:   Antibiotic Therapy: Anti-infectives (From admission, onward)    Start     Dose/Rate Route Frequency Ordered Stop   12/17/23 1300  fluconazole (DIFLUCAN) IVPB 200 mg        200 mg 100 mL/hr over 60 Minutes Intravenous Every 24 hours 12/17/23 1210         Procedures:   Consultants: Oncology IR

## 2023-12-17 NOTE — Assessment & Plan Note (Addendum)
 12/17/23 dtr states pt has failed chemotherapy. Pt has been getting XRT for last 2-3 weeks. Oncology consulted.

## 2023-12-17 NOTE — Assessment & Plan Note (Addendum)
 12/17/23 start IV diflucan.

## 2023-12-18 ENCOUNTER — Ambulatory Visit

## 2023-12-18 DIAGNOSIS — R627 Adult failure to thrive: Secondary | ICD-10-CM | POA: Diagnosis not present

## 2023-12-18 DIAGNOSIS — Z66 Do not resuscitate: Secondary | ICD-10-CM | POA: Diagnosis not present

## 2023-12-18 DIAGNOSIS — K1233 Oral mucositis (ulcerative) due to radiation: Secondary | ICD-10-CM | POA: Diagnosis not present

## 2023-12-18 DIAGNOSIS — I5032 Chronic diastolic (congestive) heart failure: Secondary | ICD-10-CM | POA: Diagnosis not present

## 2023-12-18 MED ORDER — CEFAZOLIN SODIUM-DEXTROSE 2-4 GM/100ML-% IV SOLN
2.0000 g | INTRAVENOUS | Status: DC
Start: 2023-12-19 — End: 2023-12-19

## 2023-12-18 MED ORDER — MAGIC MOUTHWASH W/LIDOCAINE
5.0000 mL | Freq: Four times a day (QID) | ORAL | Status: DC
  Administered 2023-12-18 – 2023-12-23 (×13): 5 mL via ORAL
  Filled 2023-12-18 (×26): qty 5

## 2023-12-18 MED ORDER — MORPHINE SULFATE (PF) 2 MG/ML IV SOLN
2.0000 mg | INTRAVENOUS | Status: AC | PRN
  Administered 2023-12-18 – 2023-12-19 (×3): 2 mg via INTRAVENOUS
  Filled 2023-12-18 (×3): qty 1

## 2023-12-18 NOTE — Evaluation (Signed)
 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  PMH: cervical/right facial mass, HTN, HLD, CAD s/p stents remotely, chronic diastolic CHF, DVT  Clinical Impression  Pt admitted with above diagnosis.  Pt currently with functional limitations due to the deficits listed below (see PT Problem List). Pt will benefit from acute skilled PT to increase their independence and safety with mobility to allow discharge.     The patient ambulated x 100 feet using rollator, multiple stops for break, gait very slow. Patiejt resides alone and uses  a rollator. Patient's daughter present.  Patient will benefit from continued inpatient follow up therapy, <3 hours/day.  Patient waiting on PEG tube placement.       If plan is discharge home, recommend the following: A little help with walking and/or transfers;A little help with bathing/dressing/bathroom;Assistance with cooking/housework;Assistance with feeding;Help with stairs or ramp for entrance   Can travel by private vehicle   No    Equipment Recommendations None recommended by PT  Recommendations for Other Services       Functional Status Assessment Patient has had a recent decline in their functional status and demonstrates the ability to make significant improvements in function in a reasonable and predictable amount of time.     Precautions / Restrictions Precautions Precautions: Fall Restrictions Weight Bearing Restrictions Per Provider Order: No      Mobility  Bed Mobility               General bed mobility comments: in recliner    Transfers   Equipment used: Rollator (4 wheels)    Sit to Stand: Supervision                Ambulation/Gait Ambulation/Gait assistance: Contact guard assist Gait Distance (Feet): 210 Feet Assistive device: Rollator (4 wheels) Gait Pattern/deviations: Trunk flexed, Step-to pattern Gait velocity: decr Gait velocity interpretation: <1.31 ft/sec, indicative of household ambulator   General Gait Details: gait slow, but steady  Careers information officer     Tilt Bed    Modified Rankin (Stroke Patients Only)       Balance       Sitting balance - Comments: static sitting-good. dynamic sitting-fair+   Standing balance support: Bilateral upper extremity supported, During functional activity Standing balance-Leahy Scale: Fair Standing balance comment: CGA to min assist with RW                             Pertinent Vitals/Pain Pain Assessment Pain Assessment: Faces Pain Location: burning in mouth Pain Intervention(s): Monitored during session    Home Living Family/patient expects to be discharged to:: Private residence Living Arrangements: Alone Available Help at Discharge: Family;Available PRN/intermittently Type of Home: House Home Access: Stairs to enter Entrance Stairs-Rails: Left Entrance Stairs-Number of Steps: 2   Home Layout: One level Home Equipment: Cane - single point;Rollator (4 wheels);Shower seat      Prior Function Prior Level of Function : Driving             Mobility Comments: used can inside and out ADLs Comments: I in ADL's  Extremity/Trunk Assessment   Upper Extremity Assessment Upper Extremity Assessment: Defer to OT evaluation RUE Deficits / Details: AROM WFL, though chronic appearing arthritic changes of his hands were noted. Functional grip strength LUE Deficits / Details: AROM WFL, though chronic appearing arthritic changes of his hands were noted. Functional grip strength    Lower Extremity Assessment Lower Extremity Assessment: Overall  WFL for tasks assessed RLE Deficits / Details: AROM WFL. Edema noted LLE Deficits / Details: AROM WFL. Edema noted    Cervical / Trunk Assessment Cervical / Trunk Assessment: Kyphotic  Communication   Communication Communication: Impaired Factors Affecting Communication: Hearing impaired    Cognition Arousal: Alert Behavior During Therapy: WFL for tasks assessed/performed   PT - Cognitive impairments: No apparent impairments                         Following commands: Intact       Cueing Cueing Techniques: Verbal cues     General Comments      Exercises     Assessment/Plan    PT Assessment Patient needs continued PT services  PT Problem List Decreased strength;Decreased cognition;Decreased activity tolerance;Decreased mobility;Decreased knowledge of precautions       PT Treatment Interventions DME instruction;Therapeutic exercise;Gait training;Functional mobility training;Therapeutic activities;Patient/family education    PT Goals (Current goals can be found in the Care Plan section)  Acute Rehab PT Goals Patient Stated Goal: go home PT Goal Formulation: With patient Time For Goal Achievement: 01/01/24 Potential to Achieve Goals: Good    Frequency Min 2X/week     Co-evaluation               AM-PAC PT 6 Clicks Mobility  Outcome Measure Help needed turning from your back to your side while in a flat bed without using bedrails?: A Lot Help needed moving from lying on your back to sitting on the side of a flat bed without using bedrails?: A Little Help needed moving to and from a bed to a chair (including a wheelchair)?: A Little Help needed standing up from a chair using your arms (e.g., wheelchair or bedside chair)?: A Little Help needed to walk in hospital room?: A Little Help needed climbing 3-5 steps with a railing? : A Lot 6 Click Score: 16    End of Session Equipment Utilized During Treatment: Gait belt Activity Tolerance: Patient  tolerated treatment well Patient left: in chair;with call bell/phone within reach;with chair alarm set Nurse Communication: Mobility status PT Visit Diagnosis: Unsteadiness on feet (R26.81);Other abnormalities of gait and mobility (R26.89);Difficulty in walking, not elsewhere classified (R26.2)    Time: 8856-8790 PT Time Calculation (min) (ACUTE ONLY): 26 min   Charges:   PT Evaluation $PT Eval Low Complexity: 1 Low PT Treatments $Gait Training: 8-22 mins PT General Charges $$ ACUTE PT VISIT: 1 Visit         Darice Potters PT Acute Rehabilitation Services Office [phone]   Potters Darice Norris 12/18/2023, 3:54 PM

## 2023-12-18 NOTE — Evaluation (Signed)
 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  PMH: cervical/right facial mass, HTN, HLD, CAD s/p stents remotely, chronic diastolic CHF, DVT     Clinical Impressions The pt is currently presenting below his baseline level of functioning for self-care management. He is limited by the below listed deficits (see OT problem list). As such, his occupational performance is compromised and he requires assistance  for self-care management. During the session, he was also noted to be with chronic appearing arthritic changes of his hands, BLE edema, and general deconditioning. He required mod assist for lower body dressing and min assist to stand using a RW. He will benefit from further OT services to maximize his independence with self care tasks and other meaningful activities. Patient will benefit from continued inpatient follow up therapy, <3 hours/day.      If plan is discharge home, recommend the following:   Assist for transportation;Assistance with cooking/housework;Help with stairs or ramp for entrance;A little help with bathing/dressing/bathroom     Functional Status Assessment   Patient has had a recent decline in their functional status and demonstrates the ability to make significant improvements in function in a reasonable and predictable amount of time.     Equipment Recommendations   Other (comment) (to be determined pending progress at next setting)     Recommendations for Other Services         Precautions/Restrictions   Precautions Precautions: Fall Restrictions Weight Bearing Restrictions Per Provider  Order: No     Mobility Bed Mobility   Bed Mobility: Supine to Sit     Supine to sit: Supervision          Transfers Overall transfer level: Needs assistance Equipment used: Rolling walker (2 wheels) Transfers: Sit to/from Stand Sit to Stand: Min assist         Balance       Sitting balance - Comments: static sitting-good. dynamic sitting-fair+       Standing balance comment: CGA to min assist with RW           ADL either performed or assessed with clinical judgement   ADL Overall ADL's : Needs assistance/impaired Eating/Feeding: Independent;Sitting   Grooming: Set up;Sitting   Upper Body Bathing: Set up;Sitting   Lower Body Bathing: Minimal assistance;Sitting/lateral leans;Sit to/from stand   Upper Body Dressing : Set up;Sitting   Lower Body Dressing: Moderate assistance;Sitting/lateral leans Lower Body Dressing Details (indicate cue type and reason): The pt stated, I'm not going to take them off (referring to his socks), because I won't be able to put them back on. Toilet Transfer: Contact guard assist;Ambulation;Rolling walker (2 wheels);Grab bars Toilet Transfer Details (indicate cue type and reason): at bathroom level Toileting- Clothing Manipulation and Hygiene: Minimal assistance;Sit to/from stand Toileting - Clothing Manipulation Details (indicate cue type and reason): at bathroom level             Vision   Additional Comments: He correctly read the time depicted on the wall clock.            Pertinent Vitals/Pain Pain Assessment Pain Assessment:  (He denied having pain, however reported having a burning discomfort to his mouth and  tongue.)     Extremity/Trunk Assessment Upper Extremity Assessment Upper Extremity Assessment: Right hand dominant;RUE deficits/detail;LUE deficits/detail;Generalized weakness RUE Deficits / Details: AROM WFL, though chronic appearing arthritic changes of his hands were noted. Functional grip  strength LUE Deficits / Details: AROM WFL, though chronic appearing arthritic changes of his hands were noted. Functional grip strength   Lower Extremity Assessment Lower Extremity Assessment: RLE deficits/detail;LLE deficits/detail;Generalized weakness RLE Deficits / Details: AROM WFL. Edema noted LLE Deficits / Details: AROM WFL. Edema noted       Communication Communication Factors Affecting Communication: Hearing impaired   Cognition Arousal: Alert Behavior During Therapy: WFL for tasks assessed/performed Cognition: No apparent impairments             OT - Cognition Comments: Oriented x4                 Following commands: Intact       Cueing  General Comments   Cueing Techniques: Verbal cues              Home Living Family/patient expects to be discharged to:: Private residence Living Arrangements: Alone Available Help at Discharge: Family;Available PRN/intermittently Type of Home: House Home Access: Stairs to enter [employer] of Steps: 2 Entrance Stairs-Rails: Left Home Layout: One level     Bathroom Shower/Tub: Walk-in shower;Door         Home Equipment: Cane - single point;Rollator (4 wheels);Shower seat          Prior Functioning/Environment Prior Level of Function : Independent/Modified Independent             Mobility Comments:  (He occasionally used a cane inside the home and rollator when outside the home.) ADLs Comments:  (Pt was independent with ADLs and short distance driving. He was able to do his yard work up until ~2 months ago.)    OT Problem List: Decreased strength;Decreased activity tolerance;Impaired balance (sitting and/or standing);Decreased knowledge of use of DME or AE;Decreased safety awareness;Increased edema   OT Treatment/Interventions: Self-care/ADL training;Therapeutic exercise;Therapeutic activities;Energy conservation;DME and/or AE instruction;Patient/family education;Balance training       OT Goals(Current goals can be found in the care plan section)   Acute Rehab OT Goals OT Goal Formulation: With patient/family Time For Goal Achievement: 01/01/24 Potential to Achieve Goals: Good ADL Goals Pt Will Perform Grooming: with modified independence;standing Pt Will Perform Lower Body Dressing: with modified independence;sitting/lateral leans;sit to/from stand Pt Will Transfer to Toilet: with modified independence;ambulating Pt Will Perform Toileting - Clothing Manipulation and hygiene: with modified independence;sit to/from stand   OT Frequency:  Min 2X/week       AM-PAC OT 6 Clicks Daily Activity     Outcome Measure Help from another person eating meals?: None Help from another person taking care of personal grooming?: A Little Help from another person toileting, which includes using toliet, bedpan, or urinal?: A Little Help from another person bathing (including washing, rinsing, drying)?: A Little Help from another person to put on and taking off regular upper body clothing?: A Little Help from another person to put on and taking off regular lower body clothing?: A Lot 6 Click Score: 18   End of Session Equipment Utilized During Treatment: Gait belt;Rolling walker (2 wheels) Nurse Communication: Mobility status  Activity Tolerance: Patient tolerated treatment well Patient left: in chair;with call bell/phone within reach;with family/visitor present  OT Visit Diagnosis: Muscle weakness (generalized) (M62.81);Unsteadiness on feet (R26.81)  Time: 8961-8895 OT Time Calculation (min): 26 min Charges:  OT General Charges $OT Visit: 1 Visit OT Evaluation $OT Eval Moderate Complexity: 1 Mod OT Treatments $Therapeutic Activity: 8-22 mins    Delanna JINNY Lesches, OTR/L 12/18/2023, 1:05 PM

## 2023-12-18 NOTE — Consult Note (Signed)
 Chief Complaint: Weight loss, failure to thrive, lymphoma, severe radiation mucositis, inability to eat; referred for percutaneous gastrostomy tube placement  Referring Provider(s): Chen,E  Supervising Physician: Hughes Simmonds  Patient Status: [facility] IP  History of Present Illness: Harold [name] is an 89 y.o. 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  Patient has been undergoing external beam radiation for the last 3 weeks and has now developed severe mucositis, weight loss, inability to eat and drink due to pain.  He was admitted from the cancer center for placement of percutaneous gastrostomy tube.  He is known to IR team from biopsy of hypermetabolic right axillary lymph node on 08/27/2023, Port-A-Cath placement and right facial mass biopsy on on 10/15/2023, Port-A-Cath removal on 10/23/2023 secondary to hematoma bacteremia and PICC line placement on 11/10/2023.   DNR limited  Past Medical History:  Diagnosis Date   Acute on chronic diastolic heart failure (HCC) 05/29/2022   BPH (benign prostatic hypertrophy)    CAD (coronary artery disease) 01/2010   S/P drug eluting stents LAD and PL branch of RCA Dr. 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   follicular lymphoma dx'd 06/2017   Hernia    HLD (hyperlipidemia)    HTN (hypertension)    NSTEMI (non-ST elevated myocardial infarction) (HCC) 01/28/2023   S/P PTCA (percutaneous transluminal coronary angioplasty) 01/30/2023    Past Surgical History:  Procedure Laterality Date   BACK SURGERY     CORONARY ANGIOGRAPHY N/A 01/29/2023   Procedure: CORONARY ANGIOGRAPHY;  Surgeon: Verlin Lonni BIRCH, MD;   Location: [facility];  Service: Cardiovascular;  Laterality: N/A;   CORONARY BALLOON ANGIOPLASTY N/A 01/29/2023   Procedure: CORONARY BALLOON ANGIOPLASTY;  Surgeon: Verlin Lonni BIRCH, MD;  Location: [facility];  Service: Cardiovascular;  Laterality: N/A;   CORONARY LITHOTRIPSY N/A 01/29/2023   Procedure: CORONARY LITHOTRIPSY;  Surgeon: Verlin Lonni BIRCH, MD;  Location: [facility];  Service: Cardiovascular;  Laterality: N/A;   CYSTOSCOPY WITH LITHOLAPAXY N/A 05/20/2019   Procedure: CYSTOSCOPY WITH LITHOLAPAXY;  Surgeon: Cam Morene ORN, MD;  Location: [facility];  Service: Urology;  Laterality: N/A;   CYSTOSCOPY/URETEROSCOPY/HOLMIUM LASER/STENT PLACEMENT Left 05/20/2019   Procedure: CYSTOSCOPY/URETEROSCOPY/HOLMIUM LASER/STENT PLACEMENT;  Surgeon: Cam Morene ORN, MD;  Location: [facility];  Service: Urology;  Laterality: Left;   HERNIA REPAIR  01/08/11   RIH   IR IMAGING GUIDED PORT INSERTION  10/15/2023   IR REMOVAL TUN ACCESS W/ PORT W/O FL MOD SED  10/23/2023   IR US  GUIDE BX ASP/DRAIN  10/15/2023   MASS EXCISION Right 08/05/2017   Procedure: EXCISION RIGHT POSTERIOR  NECK LYMPH NODE;  Surgeon: Ethyl Lonni BRAVO, MD;  Location: [facility];  Service: ENT;  Laterality: Right;    Allergies: Patient has no known allergies.  Medications: Prior to Admission medications   Medication Sig Start Date End Date Taking? Authorizing Provider  acetaminophen  (TYLENOL ) 325 MG tablet Take 325 mg by mouth daily as needed for mild pain (pain score 1-3) or moderate pain (pain score 4-6).   Yes [provider]  apixaban  (ELIQUIS ) 5 MG TABS tablet Take 5 mg by mouth 2 (two) times a week.   Yes [provider]  aspirin  EC 81 MG tablet Take 1 tablet (81 mg total) by mouth daily. Swallow whole. 10/26/23  Yes Cindy Garnette POUR, MD  atenolol  (TENORMIN ) 25 MG tablet Take 0.5 tablets (12.5 mg total) by mouth daily. 10/26/23  Yes Cindy Garnette POUR, MD   brimonidine  (ALPHAGAN ) 0.2 % ophthalmic solution Place 1 drop into the right eye daily.   Yes [provider]  Cholecalciferol  (VITAMIN D3) 50 MCG (2000 UT) TABS Take 2,000 Units by mouth daily.   Yes [provider]  cyanocobalamin  (VITAMIN B12) 1000 MCG tablet Take 1,000 mcg by mouth daily.   Yes [provider]  docusate sodium  (COLACE) 100 MG capsule Take 1 capsule (100 mg total) by mouth 2 (two) times daily as needed for mild constipation. 10/25/23  Yes Cindy Garnette POUR, MD  finasteride  (PROSCAR ) 5 MG tablet Take 5 mg by mouth daily.   Yes [provider]  furosemide  (LASIX ) 20 MG tablet TAKE 1 TABLET BY MOUTH TWICE  DAILY 02/19/22  Yes Parthenia Olivia HERO, PA-C  gabapentin  (NEURONTIN ) 100 MG capsule Take 100 mg by mouth daily as needed (for knee pain). 10/24/21  Yes [provider]  nitroGLYCERIN  (NITROSTAT ) 0.4 MG SL tablet Place 1 tablet (0.4 mg total) under the tongue every 5 (five) minutes as needed for chest pain. 09/29/23 01/16/25 Yes Verlin Lonni BIRCH, MD  omeprazole (PRILOSEC) 40 MG capsule Take 40 mg by mouth daily as needed (for reflux symptoms). 08/04/23  Yes [provider]  ondansetron  (ZOFRAN ) 8 MG tablet Take 1 tablet (8 mg total) by mouth every 8 (eight) hours as needed for nausea or vomiting. Start on day 3 after cyclophosphamide  chemotherapy. 10/15/23  Yes [name], MD  oxyCODONE  (OXY IR/ROXICODONE ) 5 MG immediate release tablet Take 1 tablet (5 mg total) by mouth every 4 (four) hours as needed for severe pain (pain score 7-10). 12/01/23  Yes Thayil, Irene T, PA-C  pravastatin  (PRAVACHOL ) 40 MG tablet Take 1 tablet (40 mg total) by mouth at bedtime. 12/26/21  Yes Verlin Lonni BIRCH, MD  prochlorperazine  (COMPAZINE ) 10 MG tablet Take 1 tablet (10 mg total) by mouth every 6 (six) hours as needed for nausea or vomiting. 10/15/23  Yes [name], MD  acyclovir  (ZOVIRAX ) 400 MG tablet Take 1 tablet (400 mg  total) by mouth daily. Patient not taking: Reported on 12/18/2023 10/15/23   [name], MD  allopurinol  (ZYLOPRIM ) 100 MG tablet Take 1 tablet (100 mg total) by mouth 2 (two) times daily. Patient not taking: Reported on 12/18/2023 11/26/23   Kale, Gautam Kishore, MD  amoxicillin -clavulanate (AUGMENTIN ) 875-125 MG tablet Take 1 tablet by mouth 2 (two) times daily. Patient not taking: Reported on 12/18/2023 11/10/23   [name], MD  lidocaine -prilocaine  (EMLA ) cream Apply to affected area once Patient not taking: Reported on 12/18/2023 10/15/23   [name], MD     Family History  Problem Relation Age of Onset   Heart failure Mother     Social History   Socioeconomic History   Marital status: Married    Spouse name: Not on file   Number of children: Not on file   Years of education: Not on file   Highest education level: Not on file  Occupational History   Not on file  Tobacco Use   Smoking status: Never   Smokeless tobacco: Never  Vaping Use   Vaping status: Never Used  Substance and Sexual Activity   Alcohol use: No  Drug use: No   Sexual activity: Not on file  Other Topics Concern   Not on file  Social History Narrative   Married   Social Drivers of Health   Financial Resource Strain: Low Risk  (11/17/2022)   Received [facility]   Overall Financial Resource Strain (CARDIA)    Difficulty of Paying Living Expenses: Not very hard  Food Insecurity: No Food Insecurity (12/17/2023)   Hunger Vital Sign    Worried About Running Out of Food in the Last Year: Never true    Ran Out of Food in the Last Year: Never true  Transportation Needs: No Transportation Needs (12/17/2023)   PRAPARE - Administrator, Civil Service (Medical): No    Lack of Transportation (Non-Medical): No  Physical Activity: Inactive (11/17/2022)   Received from [facility]   Exercise Vital Sign    On average, how many days per week do you engage in moderate  to strenuous exercise (like a brisk walk)?: 0 days    On average, how many minutes do you engage in exercise at this level?: 0 min  Stress: Stress Concern Present (11/17/2022)   Received from [facility] - Occupational Stress Questionnaire    Feeling of Stress : Rather much  Social Connections: Moderately Integrated (12/17/2023)   Social Connection and Isolation Panel    Frequency of Communication with Friends and Family: Three times a week    Frequency of Social Gatherings with Friends and Family: Once a week    Attends Religious Services: 1 to 4 times per year    Active Member of [employer] or Organizations: No    Attends Banker Meetings: 1 to 4 times per year    Marital Status: Widowed       Review of Systems see above; patient currently denies fever, chest pain, worsening dyspnea, cough, abdominal pain, nausea, vomiting.  Vital Signs: BP 121/66 (BP Location: Left Arm)   Pulse 82   Temp 98.7 F (37.1 C)   Resp 16   Ht 5' 10 (1.778 m)   Wt 135 lb 9.3 oz (61.5 kg)   SpO2 100%   BMI 19.45 kg/m   Advance Care Plan: No documents on file  Physical Exam: Patient awake, answering questions okay.  Large mass noted on right side of face, chest clear to auscultation bilaterally.  Heart with regular rate and rhythm.  Abdomen soft, nontender, positive bowel sounds.  Bilateral 1+ edema lower extremities.  Imaging: CT ABDOMEN WO CONTRAST Result Date: 12/18/2023 CLINICAL DATA:  History of lymphoma and external beam radiation. Patient has developed mucositis and been losing weight. Patient is unable eat and drink. Evaluate anatomy for gastrostomy tube placement. EXAM: CT ABDOMEN WITHOUT CONTRAST TECHNIQUE: Multidetector CT imaging of the abdomen was performed following the standard protocol without IV contrast. RADIATION DOSE REDUCTION: This exam was performed according to the departmental dose-optimization program which includes  automated exposure control, adjustment of the mA and/or kV according to patient size and/or use of iterative reconstruction technique. COMPARISON:  PET-CT 08/25/2023 FINDINGS: Lower chest: Lung bases are clear. Hepatobiliary: Normal appearance of the liver and gallbladder. Pancreas: Diffuse pancreatic calcifications compatible with prior inflammation. Spleen: Normal in size without focal abnormality. Adrenals/Urinary Tract: Normal adrenal glands. Normal appearance of both kidneys without hydronephrosis or stones. Stomach/Bowel: Normal appearance of the stomach. The stomach is located high in the left upper quadrant. Limited percutaneous window for gastrostomy tube placement  due to the transverse colon and splenic flexure of the colon interposed between the abdominal wall and the stomach. No evidence for bowel dilatation or obstruction. Vascular/Lymphatic: Atherosclerotic disease in the abdominal aorta without aneurysm. Decreased size of the retroperitoneal lymph nodes in the periaortic stations. Index lymph node below the left renal vessels measures 0.9 cm in the short axis on image 27/2 and measured 1.5 cm on 08/25/2023. Other: Negative for ascites. Musculoskeletal: Multilevel degenerative disc disease in the visualized spine. No acute bone abnormality. IMPRESSION: 1. Stomach is located high in the left upper quadrant of the abdomen. Limited percutaneous window for gastrostomy tube placement due to interposed colon. Recommend visualization of the colon for percutaneous gastrostomy tube placement. 2. Decreased retroperitoneal lymphadenopathy since 08/25/2023. Electronically Signed   By: Juliene Balder M.D.   On: 12/18/2023 12:01    Labs:  CBC: Recent Labs    11/10/23 0948 11/25/23 1104 12/17/23 0916 12/17/23 1347  WBC 12.2* 19.7* 8.1 8.5  HGB 9.8* 9.3* 11.0* 10.7*  HCT 30.9* 29.6* 33.7* 34.9*  PLT 235 193 261 251    COAGS: Recent Labs    01/29/23 0629 01/29/23 2010 01/30/23 1041 10/15/23 0653  10/18/23 1826 12/17/23 1347  INR  --   --   --  1.1 1.2 1.0  APTT 35 160* 38*  --  31  --     BMP: Recent Labs    11/10/23 0948 11/25/23 1104 12/17/23 0907 12/17/23 1347  NA 143 143 139 139  K 4.0 3.7 3.7 3.9  CL 105 104 98 97*  CO2 31 33* 32 26  GLUCOSE 79 83 92 79  BUN 17 17 18 17   CALCIUM 8.7* 9.0 9.3 9.6  CREATININE 0.73 0.89 0.80 0.71  GFRNONAA >60 >60 >60 >60    LIVER FUNCTION TESTS:  TUMOR MARKERS: No results for input(s): AFPTM, CEA, CA199, CHROMGRNA in the last 8760 hours.  Assessment and Plan: 89 y.o. 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  Patient has been undergoing external beam radiation for the last 3 weeks and has now developed severe mucositis, weight loss, inability to eat and drink due to pain.  He was admitted from the cancer center for placement of percutaneous gastrostomy tube.  He is known to IR team from biopsy of hypermetabolic right axillary lymph node on 08/27/2023, Port-A-Cath placement and right facial mass biopsy on on 10/15/2023, Port-A-Cath removal on 10/23/2023 secondary to hematoma bacteremia and PICC line placement on 11/10/2023.  Latest imaging studies were reviewed by Dr. Balder.  Patient's stomach is located high in the left upper quadrant and there is limited percutaneous window for gastrostomy tube placement due to interposed colon.  Patient will require administration of barium overnight  prior to attempt at G-tube placement tomorrow.  Details/risks of gastrostomy tube placement, including but not limited to, internal bleeding, infection, injury to adjacent structures, inability to place tube discussed with patient and daughter with their understanding and consent.   Thank you for allowing our service to participate in  Harold [name] 's care.  Electronically Signed: D. Franky Rakers, PA-C   12/18/2023, 5:08 PM      I spent a total of 40 Minutes    in face to face in clinical consultation, greater than 50% of which was counseling/coordinating care for image guided gastrostomy tube placement

## 2023-12-18 NOTE — Plan of Care (Signed)

## 2023-12-18 NOTE — Progress Notes (Signed)
 [facility]  PROGRESS NOTE  Harold [name] FMW:[id number] DOB: April 24, 1934 DOA: 12/17/2023 PCP: [name], MD   Brief HPI:   89 year old Caucasian male with a history of non-Hodgkin's lymphoma with recurrence now transformed into B-cell lymphoma of his head neck who has been undergoing external beam radiation for the last 3 weeks.  He has developed mucositis.  He has lost about 30 pounds in the last 2 to 3 months.  He is unable to eat and drink due to pain.  Was seen in the cancer office today and directly admitted to the hospital to have a gastrostomy tube placed.  Patient states he has had mouth pain and inability to eat ever since he started external beam radiation.  He states food does not taste good.  He has been having difficulty swallowing and controlling his secretions due to mucositis.  Denies any fever or chills.   He has chronic lower extremity edema almost lymphedema.  He has 2-3+ pitting edema of his lower legs.  However both the patient and his daughter state that his current level of edema at this point is actually quite good.  He is on Lasix  daily.  He has a history of heart failure.   Patient currently does not have a Port-A-Cath as this was taken out due to bleeding.   Patient was seen in Dr. Maryla office due to his mucositis and directly admitted to the hospital.  Patient is agreeable to feeding tube placement.   He states that he wants to be a DO NOT RESUSCITATE.    Assessment/Plan:    Failure to thrive in adult . IR consult for g-tube placement. Pt thinks his last dose of eliquis  was on Sunday. But he thinks he may have skipped it for the last week due to mucositis. Regardless, it has been at least 48 hours since his last dose. Dietician consult for nutritional status assessment and tube feed recommendations after his g-tube has been placed.     Oral mucositis due to radiation therapy Started on IV diflucan.   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 Pt has +2 bilateral pitting pretibial, ankle and pedal edema. Both dtr, pt and son-in-law all state that current level of edema is actually better than normal for patient. May benefit from ACE bandage compression wraps.     DNR (do not resuscitate)/DNI(Do Not Intubate) Admitting provider confirmed his desire for DNR/DNI status. DNR form completed  Diffuse large B-cell lymphoma of lymph nodes of multiple regions [facility]) As per daughter pt has failed chemotherapy. Pt has been getting XRT for last 2-3 weeks. Oncology consulted.   History of DVT (deep vein thrombosis) Eliquis  on hold for his g-tube. Can restart once his g-tube has been placed. SCDs for now.   Chronic diastolic CHF (congestive heart failure) (HCC) Not in exacerbation.  Lasix  on hold      HYPERTENSION, BENIGN ESSENTIAL  prn IV labetalol   Medications      Data Reviewed:   CBG:  No results for input(s): GLUCAP in the last 168 hours.  SpO2: 100 %    Vitals:   12/17/23 1300 12/17/23 2036 12/17/23 2119 12/18/23 0029  BP: 126/63 126/63 (!) 101/49 (!) 114/53  Pulse: 69 69 71 63  Resp: 18 18 18 17   Temp: 98.5 F (36.9 C) 98.5 F (36.9 C) 97.6 F (36.4 C) 98.5 F (36.9 C)  TempSrc:  Oral Oral Oral  SpO2: 100%  100% 100%  Weight:  61.5 kg  Height:  5' 10 (1.778 m)        Data Reviewed:  Basic Metabolic Panel: Recent Labs  Lab 12/17/23 0907 12/17/23 1347  NA 139 139  K 3.7 3.9  CL 98 97*  CO2 32 26  GLUCOSE 92 79  BUN 18 17  CREATININE 0.80 0.71  CALCIUM 9.3 9.6    CBC: Recent Labs  Lab 12/17/23 0916 12/17/23 1347  WBC 8.1 8.5  NEUTROABS 6.2 6.6  HGB 11.0* 10.7*  HCT 33.7* 34.9*  MCV 85.8 88.8  PLT 261 251    LFT Recent Labs  Lab 12/17/23 0907 12/17/23 1347  AST 14* 18  ALT 7 7  ALKPHOS 52 58  BILITOT 0.8 0.8  PROT 6.2* 5.9*  ALBUMIN 4.0 3.9     Antibiotics: Anti-infectives (From admission, onward)    Start     Dose/Rate Route Frequency Ordered Stop    12/17/23 1300  fluconazole (DIFLUCAN) IVPB 200 mg        200 mg 100 mL/hr over 60 Minutes Intravenous Every 24 hours 12/17/23 1210          DVT prophylaxis: SCDs  Code Status: DNR  Family Communication: Discussed with patient's daughter and son-in-law at bedside   CONSULTS oncology   Subjective   Awaiting PEG tube placement.   Objective    Physical Examination:   General-appears in no acute distress HEENT-large mass noted on the right side of face Heart-S1-S2, regular, no murmur auscultated Lungs-clear to auscultation bilaterally, no wheezing or crackles auscultated Abdomen-soft, nontender, no organomegaly Extremities-bilateral 1+  edema in the lower extremities Neuro-alert, oriented x3, no focal deficit noted  Status is: Inpatient:             Sabas GORMAN Brod   [facility] If 7PM-7AM, please contact night-coverage at [email or web address], Office  [phone]   12/18/2023, 8:49 AM  LOS: 1 day

## 2023-12-19 ENCOUNTER — Ambulatory Visit

## 2023-12-19 ENCOUNTER — Other Ambulatory Visit: Payer: Self-pay

## 2023-12-19 DIAGNOSIS — Z7189 Other specified counseling: Secondary | ICD-10-CM | POA: Diagnosis not present

## 2023-12-19 DIAGNOSIS — R627 Adult failure to thrive: Secondary | ICD-10-CM | POA: Diagnosis not present

## 2023-12-19 DIAGNOSIS — E43 Unspecified severe protein-calorie malnutrition: Secondary | ICD-10-CM | POA: Diagnosis not present

## 2023-12-19 DIAGNOSIS — C8338 Diffuse large B-cell lymphoma, lymph nodes of multiple sites: Secondary | ICD-10-CM | POA: Diagnosis not present

## 2023-12-19 MED ORDER — MORPHINE SULFATE (PF) 2 MG/ML IV SOLN
2.0000 mg | INTRAVENOUS | Status: DC | PRN
  Administered 2023-12-19 – 2023-12-23 (×10): 2 mg via INTRAVENOUS
  Filled 2023-12-19 (×11): qty 1

## 2023-12-19 MED ORDER — CEFAZOLIN SODIUM-DEXTROSE 2-4 GM/100ML-% IV SOLN
2.0000 g | INTRAVENOUS | Status: AC
  Administered 2023-12-22: 2 g via INTRAVENOUS
  Filled 2023-12-19: qty 100

## 2023-12-19 MED ORDER — DOXEPIN HCL 10 MG/ML PO CONC
25.0000 mg | Freq: Every day | ORAL | Status: AC
  Administered 2023-12-19: 25 mg via ORAL
  Filled 2023-12-19: qty 2.5

## 2023-12-19 MED ORDER — SODIUM BICARBONATE/SODIUM CHLORIDE MOUTHWASH
Freq: Two times a day (BID) | OROMUCOSAL | Status: AC | PRN
  Filled 2023-12-19: qty 1000

## 2023-12-19 NOTE — Consult Note (Signed)
 Consultation Note Date: 12/19/2023   Patient Name: Harold [name]  DOB: February 28, 1935  MRN: [id number]  Age / Sex: 89 y.o., male  PCP: Harold Lenis, MD Referring Physician: Cheryle Page, MD  Reason for Consultation: Establishing goals of care  HPI/Patient Profile: 89 y.o. male  admitted on 12/17/2023    Clinical Assessment and Goals of Care:  89 year old male with past medical history of chronic diastolic CHF, possible chronic lymphedema, hypertension, history of DVT, non-Hodgkin's lymphoma with recurrence now transformed into B-cell lymphoma of his head and neck who has been undergoing external beam radiation for the last 3 weeks complicated by mucositis and 30 pounds weight loss in the last 2 to 3 months has been admitted to hospital medicine service with medical oncology closely following, additionally, IR has been involved as patient is under consideration for gastrostomy tube placement.    A palliative consult has been requested for ongoing goals of care discussions.   Chart reviewed, discussed with RN, patient seen and examined.   Inter disciplinary discussions have taken place between oncology, hospital medicine, IR and RN colleagues as well.   Palliative medicine is specialized medical care for people living with serious illness. It focuses on providing relief from the symptoms and stress of a serious illness. The goal is to improve quality of life for both the patient and the family.  Goals of care: Broad aims of medical therapy in relation to the patient's values and preferences. Our aim is to provide medical care aimed at enabling patients to achieve the goals that matter most to them, given the circumstances of their particular medical situation and their constraints.   Life Review: patient states that he lives off of [address], he states that he lives on his land, he has given away some  of his land to his daughter, he states that his mother died of CHF complications, he states that he wishes to avoid pain and suffering at end of life.   NEXT OF KIN  Daughter Harold [name]  SUMMARY OF RECOMMENDATIONS   Goals of care discussion:  Patient verbalizes understanding that he has advanced head and neck cancer. Expresses severe frustration due to peeling/bleeding from lips and buccal mucosa due to severe oral mucositis making eating and drinking difficult. Reports awareness that nutrition and hydration are declining. Expressed desire to have symptoms, especially mouth pain, managed aggressively, although patient frequently says, I don't know what I want.  Patient tells me he is not opposed to PEG tube placement and knows that he is under consideration for SNF rehab placement.   Discussed with patient about mucositis management - already on miracle mouthwash, have also added sodium bicarb swish and spit as well as Doxepin rinse. Appreciate RN's efforts in providing good oral care.   DNR DNI.  PMT to continue to follow.    Code Status/Advance Care Planning: DNR   Symptom Management:     Palliative Prophylaxis:  Frequent Pain Assessment and Oral Care  Additional Recommendations (Limitations, Scope, Preferences): Full Scope  Treatment  Psycho-social/Spiritual:  Desire for further Chaplaincy support:yes Additional Recommendations: Caregiving  Support/Resources  Prognosis:  Unable to determine  Discharge Planning: Skilled Nursing Facility for rehab with Palliative care service follow-up      Primary Diagnoses: Present on Admission:  Failure to thrive in adult  Diffuse large B-cell lymphoma of lymph nodes of multiple regions (HCC)  HYPERTENSION, BENIGN ESSENTIAL  Chronic diastolic CHF (congestive heart failure) (HCC)   I have reviewed the medical record, interviewed the patient and family, and examined the patient. The following aspects are pertinent.  Past  Medical History:  Diagnosis Date   Acute on chronic diastolic heart failure (HCC) 05/29/2022   BPH (benign prostatic hypertrophy)    CAD (coronary artery disease) 01/2010   S/P drug eluting stents LAD and PL branch of RCA Dr. 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   follicular lymphoma dx'd 06/2017   Hernia    HLD (hyperlipidemia)    HTN (hypertension)    NSTEMI (non-ST elevated myocardial infarction) (HCC) 01/28/2023   S/P PTCA (percutaneous transluminal coronary angioplasty) 01/30/2023   Social History   Socioeconomic History   Marital status: Married    Spouse name: Not on file   Number of children: Not on file   Years of education: Not on file   Highest education level: Not on file  Occupational History   Not on file  Tobacco Use   Smoking status: Never   Smokeless tobacco: Never  Vaping Use   Vaping status: Never Used  Substance and Sexual Activity   Alcohol use: No   Drug use: No   Sexual activity: Not on file  Other Topics Concern   Not on file  Social History Narrative   Married   Social Drivers of Health   Financial Resource Strain: Low Risk  (11/17/2022)   Received from [facility]   Overall Financial Resource Strain (CARDIA)    Difficulty of Paying Living Expenses: Not very hard  Food Insecurity: No Food Insecurity (12/17/2023)   Hunger Vital Sign    Worried About Running Out of Food in the Last Year: Never true    Ran Out of Food in the Last Year: Never true  Transportation Needs: No Transportation Needs (12/17/2023)   PRAPARE - Administrator, Civil Service (Medical): No    Lack of Transportation (Non-Medical): No  Physical Activity: Inactive (11/17/2022)   Received from [facility]   Exercise Vital Sign    On average, how many days per week do you engage in moderate to strenuous exercise (like a brisk walk)?: 0 days     On average, how many minutes do you engage in exercise at this level?: 0 min  Stress: Stress Concern Present (11/17/2022)   Received from [facility] of Occupational Health - Occupational Stress Questionnaire    Feeling of Stress : Rather much  Social Connections: Moderately Integrated (12/17/2023)   Social Connection and Isolation Panel    Frequency of Communication with Friends and Family: Three times a week    Frequency of Social Gatherings with Friends and Family: Once a week    Attends Religious Services: 1 to 4 times per year    Active Member of [employer] or Organizations: No    Attends Banker Meetings: 1 to 4 times per year  Marital Status: Widowed   Family History  Problem Relation Age of Onset   Heart failure Mother    Scheduled Meds:  doxepin  25 mg Oral QHS   magic mouthwash w/lidocaine   5 mL Oral QID   Continuous Infusions:   ceFAZolin  (ANCEF ) IV     fluconazole (DIFLUCAN) IV 200 mg (12/18/23 1335)   PRN Meds:.lip balm, sodium bicarbonate/sodium chloride  Medications Prior to Admission:  Prior to Admission medications   Medication Sig Start Date End Date Taking? Authorizing Provider  acetaminophen  (TYLENOL ) 325 MG tablet Take 325 mg by mouth daily as needed for mild pain (pain score 1-3) or moderate pain (pain score 4-6).   Yes [provider]  apixaban  (ELIQUIS ) 5 MG TABS tablet Take 5 mg by mouth 2 (two) times a week.   Yes [provider]  aspirin  EC 81 MG tablet Take 1 tablet (81 mg total) by mouth daily. Swallow whole. 10/26/23  Yes Cindy Garnette POUR, MD  atenolol  (TENORMIN ) 25 MG tablet Take 0.5 tablets (12.5 mg total) by mouth daily. 10/26/23  Yes Cindy Garnette POUR, MD  brimonidine  (ALPHAGAN ) 0.2 % ophthalmic solution Place 1 drop into the right eye daily.   Yes [provider]  Cholecalciferol  (VITAMIN D3) 50 MCG (2000 UT) TABS Take 2,000 Units by mouth daily.   Yes [provider]  cyanocobalamin   (VITAMIN B12) 1000 MCG tablet Take 1,000 mcg by mouth daily.   Yes [provider]  docusate sodium  (COLACE) 100 MG capsule Take 1 capsule (100 mg total) by mouth 2 (two) times daily as needed for mild constipation. 10/25/23  Yes Cindy Garnette POUR, MD  finasteride  (PROSCAR ) 5 MG tablet Take 5 mg by mouth daily.   Yes [provider]  furosemide  (LASIX ) 20 MG tablet TAKE 1 TABLET BY MOUTH TWICE  DAILY 02/19/22  Yes Parthenia Olivia HERO, PA-C  gabapentin  (NEURONTIN ) 100 MG capsule Take 100 mg by mouth daily as needed (for knee pain). 10/24/21  Yes [provider]  nitroGLYCERIN  (NITROSTAT ) 0.4 MG SL tablet Place 1 tablet (0.4 mg total) under the tongue every 5 (five) minutes as needed for chest pain. 09/29/23 01/16/25 Yes Verlin Lonni BIRCH, MD  omeprazole (PRILOSEC) 40 MG capsule Take 40 mg by mouth daily as needed (for reflux symptoms). 08/04/23  Yes [provider]  ondansetron  (ZOFRAN ) 8 MG tablet Take 1 tablet (8 mg total) by mouth every 8 (eight) hours as needed for nausea or vomiting. Start on day 3 after cyclophosphamide  chemotherapy. 10/15/23  Yes [name], MD  oxyCODONE  (OXY IR/ROXICODONE ) 5 MG immediate release tablet Take 1 tablet (5 mg total) by mouth every 4 (four) hours as needed for severe pain (pain score 7-10). 12/01/23  Yes Thayil, Irene T, PA-C  pravastatin  (PRAVACHOL ) 40 MG tablet Take 1 tablet (40 mg total) by mouth at bedtime. 12/26/21  Yes Verlin Lonni BIRCH, MD  prochlorperazine  (COMPAZINE ) 10 MG tablet Take 1 tablet (10 mg total) by mouth every 6 (six) hours as needed for nausea or vomiting. 10/15/23  Yes [name], MD  acyclovir  (ZOVIRAX ) 400 MG tablet Take 1 tablet (400 mg total) by mouth daily. Patient not taking: Reported on 12/18/2023 10/15/23   [name], MD  allopurinol  (ZYLOPRIM ) 100 MG tablet Take 1 tablet (100 mg total) by mouth 2 (two) times daily. Patient not taking: Reported on 12/18/2023 11/26/23   [name], MD  amoxicillin -clavulanate (AUGMENTIN ) 875-125 MG tablet Take 1 tablet by mouth 2 (two)  times daily. Patient not taking: Reported on 12/18/2023 11/10/23   [name], MD  lidocaine -prilocaine  (EMLA ) cream Apply to affected area once Patient not taking: Reported on 12/18/2023 10/15/23   [name], MD   No Known Allergies Review of Systems + pain in mouth + pain from crusting lesions on lips +facial pain and neck pain from mass  Physical Exam Elderly appearing gentleman Large mass R side of face and neck Crusted bleeding lesions on lips and buccal mucosa from mucositis Trace edema Awake alert  Vital Signs: BP (!) 112/58 (BP Location: Left Arm)   Pulse 80   Temp 97.9 F (36.6 C) (Oral)   Resp 18   Ht 5' 10 (1.778 m)   Wt 61.5 kg   SpO2 98%   BMI 19.45 kg/m  Pain Scale: 0-10 POSS *See Group Information*: 1-Acceptable,Awake and alert Pain Score: Asleep   SpO2: SpO2: 98 % O2 Device:SpO2: 98 % O2 Flow Rate: .   IO: Intake/output summary: No intake or output data in the 24 hours ending 12/19/23 1150  LBM: Last BM Date :  (PTA) Baseline Weight: Weight: 61.5 kg Most recent weight: Weight: 61.5 kg     Palliative Assessment/Data:   PPS 50%  Time In:  10 Time Out:  11.30 Time Total:  90 Greater than 50%  of this time was spent counseling and coordinating care related to the above assessment and plan.  Signed by: Lonia Serve, MD   Please contact Palliative Medicine Team phone at [phone] for questions and concerns.  For individual provider: See Tracey

## 2023-12-19 NOTE — Plan of Care (Signed)
   Problem: Activity: Goal: Risk for activity intolerance will decrease Outcome: Progressing   Problem: Coping: Goal: Level of anxiety will decrease Outcome: Progressing   Problem: Elimination: Goal: Will not experience complications related to urinary retention Outcome: Progressing

## 2023-12-19 NOTE — Progress Notes (Signed)
 PROGRESS NOTE    Harold [name]  FMW:[id number] DOB: March 03, 1935 DOA: 12/17/2023 PCP: [name], MD   Brief Narrative:  89 year old male with past medical history of chronic diastolic CHF, possible chronic lymphedema, hypertension, history of DVT, non-Hodgkin's lymphoma with recurrence now transformed into B-cell lymphoma of his head and neck who has been undergoing external beam radiation for the last 3 weeks complicated by mucositis and 30 pounds weight loss in the last 2 to 3 months and was referred for direct admission from oncology office for gastrostomy tube placement.  IR was consulted.  Assessment & Plan:   Oral mucositis due to radiation therapy Failure to thrive in adult Diffuse large B-cell lymphoma of lymph nodes of multiple regions -Patient was undergoing external beam radiation for the last 3 weeks complicated by mucositis and 30 pounds weight loss in the last 2 to 3 months and was referred for direct admission from oncology office for gastrostomy tube placement.  IR planning for G-tube placement today. -Currently on IV Diflucan. - Consult palliative care for goals of care discussion. - Oncology following - Nutrition consult after G-tube placement  History of DVT - Resume Eliquis  once cleared by IR  Chronic diastolic CHF - Currently compensated.  Lasix  on hold  Bilateral lower extremity chronic edema/possible lymphedema - Apparently, current level of edema is actually better than normal for patient.  May benefit from compression wraps.  Lasix  on hold for now.  Hypertension - Blood pressure stable and intermittently on the lower side.  Anemia of chronic disease - From chronic illnesses.  Hemoglobin stable.  Monitor intermittently.    DVT prophylaxis: SCDs Code Status: DNR Family Communication: None at bedside Disposition Plan: Status is: Inpatient Remains inpatient appropriate because: Of severity of illness    Consultants: Oncology/IR.  Consult  palliative care  Procedures: None  Antimicrobials: Diflucan   Subjective: Patient seen and examined at bedside.  Continues to have painful swallowing with poor appetite.  No fever, vomiting, agitation reported.  Objective: Vitals:   12/18/23 0029 12/18/23 1319 12/18/23 2000 12/19/23 0505  BP: (!) 114/53 121/66 (!) 118/50 (!) 112/58  Pulse: 63 82 90 80  Resp: 17 16 18 18   Temp: 98.5 F (36.9 C) 98.7 F (37.1 C) 98.3 F (36.8 C) 97.9 F (36.6 C)  TempSrc: Oral  Oral Oral  SpO2: 100% 100% 96% 98%  Weight:      Height:       No intake or output data in the 24 hours ending 12/19/23 0743 Filed Weights   12/17/23 2036  Weight: 61.5 kg    Examination:  General exam: Appears calm and comfortable.  Large mass noted on the right side of the face. Respiratory system: Bilateral decreased breath sounds at bases Cardiovascular system: S1 & S2 heard, Rate controlled Gastrointestinal system: Abdomen is nondistended, soft and nontender. Normal bowel sounds heard. Extremities: No cyanosis, clubbing; bilateral lower extremity edema present Central nervous system: Alert and oriented.  Slow to respond.  Slow to respond.  No focal neurological deficits. Moving extremities Skin: No rashes, lesions or ulcers Psychiatry: Flat affect.  Not agitated.  Data Reviewed: I have personally reviewed following labs and imaging studies  CBC: Recent Labs  Lab 12/17/23 0916 12/17/23 1347  WBC 8.1 8.5  NEUTROABS 6.2 6.6  HGB 11.0* 10.7*  HCT 33.7* 34.9*  MCV 85.8 88.8  PLT 261 251   Basic Metabolic Panel: Recent Labs  Lab 12/17/23 0907 12/17/23 1347  NA 139 139  K 3.7 3.9  CL 98 97*  CO2 32 26  GLUCOSE 92 79  BUN 18 17  CREATININE 0.80 0.71  CALCIUM 9.3 9.6   GFR: Estimated Creatinine Clearance: 56.6 mL/min (by C-G formula based on SCr of 0.71 mg/dL). Liver Function Tests: Recent Labs  Lab 12/17/23 0907 12/17/23 1347  AST 14* 18  ALT 7 7  ALKPHOS 52 58  BILITOT 0.8 0.8  PROT  6.2* 5.9*  ALBUMIN 4.0 3.9   No results for input(s): LIPASE, AMYLASE in the last 168 hours. No results for input(s): AMMONIA in the last 168 hours. Coagulation Profile: Recent Labs  Lab 12/17/23 1347  INR 1.0   Cardiac Enzymes: No results for input(s): CKTOTAL, CKMB, CKMBINDEX, TROPONINI in the last 168 hours. BNP (last 3 results) No results for input(s): PROBNP in the last 8760 hours. HbA1C: No results for input(s): HGBA1C in the last 72 hours. CBG: No results for input(s): GLUCAP in the last 168 hours. Lipid Profile: No results for input(s): CHOL, HDL, LDLCALC, TRIG, CHOLHDL, LDLDIRECT in the last 72 hours. Thyroid  Function Tests: No results for input(s): TSH, T4TOTAL, FREET4, T3FREE, THYROIDAB in the last 72 hours. Anemia Panel: No results for input(s): VITAMINB12, FOLATE, FERRITIN, TIBC, IRON, RETICCTPCT in the last 72 hours. Sepsis Labs: No results for input(s): PROCALCITON, LATICACIDVEN in the last 168 hours.  No results found for this or any previous visit (from the past 240 hours).       Radiology Studies: CT ABDOMEN WO CONTRAST Result Date: 12/18/2023 CLINICAL DATA:  History of lymphoma and external beam radiation. Patient has developed mucositis and been losing weight. Patient is unable eat and drink. Evaluate anatomy for gastrostomy tube placement. EXAM: CT ABDOMEN WITHOUT CONTRAST TECHNIQUE: Multidetector CT imaging of the abdomen was performed following the standard protocol without IV contrast. RADIATION DOSE REDUCTION: This exam was performed according to the departmental dose-optimization program which includes automated exposure control, adjustment of the mA and/or kV according to patient size and/or use of iterative reconstruction technique. COMPARISON:  PET-CT 08/25/2023 FINDINGS: Lower chest: Lung bases are clear. Hepatobiliary: Normal appearance of the liver and gallbladder. Pancreas: Diffuse  pancreatic calcifications compatible with prior inflammation. Spleen: Normal in size without focal abnormality. Adrenals/Urinary Tract: Normal adrenal glands. Normal appearance of both kidneys without hydronephrosis or stones. Stomach/Bowel: Normal appearance of the stomach. The stomach is located high in the left upper quadrant. Limited percutaneous window for gastrostomy tube placement due to the transverse colon and splenic flexure of the colon interposed between the abdominal wall and the stomach. No evidence for bowel dilatation or obstruction. Vascular/Lymphatic: Atherosclerotic disease in the abdominal aorta without aneurysm. Decreased size of the retroperitoneal lymph nodes in the periaortic stations. Index lymph node below the left renal vessels measures 0.9 cm in the short axis on image 27/2 and measured 1.5 cm on 08/25/2023. Other: Negative for ascites. Musculoskeletal: Multilevel degenerative disc disease in the visualized spine. No acute bone abnormality. IMPRESSION: 1. Stomach is located high in the left upper quadrant of the abdomen. Limited percutaneous window for gastrostomy tube placement due to interposed colon. Recommend visualization of the colon for percutaneous gastrostomy tube placement. 2. Decreased retroperitoneal lymphadenopathy since 08/25/2023. Electronically Signed   By: Juliene Balder M.D.   On: 12/18/2023 12:01        Scheduled Meds:  magic mouthwash w/lidocaine   5 mL Oral QID   Continuous Infusions:   ceFAZolin  (ANCEF ) IV     fluconazole (DIFLUCAN) IV 200 mg (12/18/23 1335)  Sophie Mao, MD [facility] 12/19/2023, 7:43 AM

## 2023-12-19 NOTE — Progress Notes (Signed)
 Mobility Specialist - Progress Note    12/19/23 1654  Mobility  Activity Ambulated with assistance  Level of Assistance Contact guard assist, steadying assist  Assistive Device Front wheel walker  Distance Ambulated (ft) 120 ft  Activity Response Tolerated well  Mobility Referral Yes  Mobility visit 1 Mobility  Mobility Specialist Start Time (ACUTE ONLY) 1610  Mobility Specialist Stop Time (ACUTE ONLY) 1630  Mobility Specialist Time Calculation (min) (ACUTE ONLY) 20 min    Pt was received in bed and agreed to mobility. Returned to bed with all needs met. Call bell in reach.  [employer] - Mobility Specialist

## 2023-12-19 NOTE — Progress Notes (Addendum)
 Patient ID: Harold [name], male   DOB: 05-26-1934, 89 y.o.   MRN: [id number] Patient was tentatively on IR schedule today for percutaneous gastrostomy tube placement.  Nurse reports this morning that patient did not drink oral contrast as prescribed last night.  Due to high placement of stomach on patient he will need oral contrast to opacify the colon before attempt at gastrostomy tube placement can be made.  Should patient wish to proceed with G-tube placement he will need to drink 1 cup of oral contrast Sunday evening at 6 PM in order for the procedure to be rescheduled for Monday(10/6).  If patient cannot drink contrast he will need placement of feeding tube/[place] in order to administer contrast beforehand. Primary team/nurse aware. Continue to hold anticoagulation.

## 2023-12-19 NOTE — TOC Initial Note (Signed)
 Transition of Care [facility] - Initial/Assessment Note    Patient Details  Name: Harold [name] MRN: [id number] Date of Birth: [birth year]  Transition of [facility] [place] CM/SW Contact:    Toy LITTIE Agar, RN Phone Number:[phone]  12/19/2023, 3:26 PM  Clinical Narrative:                 CM at bedside to discuss disposition planning. Patient becomes argumentative when CM is asking about disposition options and SNF recommendation. Patient begins to raise his voice( yelling) stating that he wants to go to inpatient hospice so he can die. Patient rambles on about how they didn't place the tube today and that he just wants to die. Patient states that he does have 2 grandsons that may be able to help with decisions but he does not want his daughter to make decisions because she wont do a damn thing. Patient ask CM numerous times if CM can get him to Hospice. CM has explained that CM will need a referral and will make MD aware. Patient verbalized that he wants [facility] for placement. Patient says if he can't go to hospice then he will go to SNF but he will need long term placement. CM made patient aware that IP care management will follow for disposition plan. Message has sent to the MD making him aware of the above documentation.   Expected Discharge Plan: Skilled Nursing Facility Barriers to Discharge: Continued Medical Work up   Patient Goals and CMS Choice Patient states their goals for this hospitalization and ongoing recovery are:: Patient states that he wants to go to inpatient hospice CMS Medicare.gov Compare Post Acute Care list provided to:: Patient Choice offered to / list presented to : Patient [place] ownership interest in [facility].provided to:: Patient    Expected Discharge Plan and Services In-house Referral: NA Discharge Planning Services: CM Consult Post Acute Care Choice: Residential Hospice Bed, Nursing Home, Hospice Living  arrangements for the past 2 months: Single Family Home                 DME Arranged: N/A DME Agency: NA       HH Arranged: NA HH Agency: NA        Prior Living Arrangements/Services Living arrangements for the past 2 months: Single Family Home Lives with:: Self Patient language and need for interpreter reviewed:: Yes Do you feel safe going back to the place where you live?: No   Patient states that he is home alone and unable to care for himself.  Need for Family Participation in Patient Care: Yes (Comment) Care giver support system in place?: Yes (comment) Current home services:  (n/a) Criminal Activity/Legal Involvement Pertinent to Current Situation/Hospitalization: No - Comment as needed  Activities of Daily Living   ADL Screening (condition at time of admission) Independently performs ADLs?: Yes (appropriate for developmental age) Is the patient deaf or have difficulty hearing?: Yes Does the patient have difficulty seeing, even when wearing glasses/contacts?: No Does the patient have difficulty concentrating, remembering, or making decisions?: No  Permission Sought/Granted Permission sought to share information with : Family Supports Permission granted to share information with : No           Permission granted to share info w Contact Information: Patient does not want CM to contact his daughter  Emotional Assessment Appearance:: Appears stated age Attitude/Demeanor/Rapport: Guarded, Complaining Affect (typically observed): Explosive, Frustrated Orientation: : Oriented to Self, Oriented to  Place, Oriented to  Time, Oriented to Situation Alcohol / Substance Use: Not Applicable Psych Involvement: No (comment)  Admission diagnosis:  Failure to thrive in adult [R62.7] Patient Active Problem List   Diagnosis Date Noted   Failure to thrive in adult 12/17/2023   Oral mucositis due to radiation therapy 12/17/2023   DNR (do not resuscitate)/DNI(Do Not Intubate)  12/17/2023   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 12/17/2023   Protein-calorie malnutrition, severe 12/17/2023   B-cell lymphoma of lymph nodes of multiple regions (HCC) 10/21/2023   Diffuse large B-cell lymphoma of lymph nodes of multiple regions (HCC) 10/15/2023   Cervical mass 10/13/2023   Bilateral lower extremity edema 12/22/2022   History of sepsis 12/22/2022   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) 11/21/2022   Pressure injury of skin 11/17/2022   Chronic diastolic CHF (congestive heart failure) (HCC) 05/29/2022   History of DVT (deep vein thrombosis) 10/30/2020   Bladder mass 03/10/2019   Left ureteral stone 02/09/2019   Follicular lymphoma grade i, lymph nodes of multiple sites (HCC) 11/03/2018   Counseling regarding advance care planning and goals of care 11/03/2018   Bilateral primary osteoarthritis of knee 06/09/2017   HLD (hyperlipidemia)    History of non anemic vitamin B12 deficiency 02/26/2017   DDD (degenerative disc disease), cervical 05/14/2016   History of malignant melanoma of skin 02/23/2015   MUSCLE PAIN 01/29/2010   CAD, NATIVE VESSEL 01/16/2010   HYPERTENSION, BENIGN ESSENTIAL 01/08/2010   MYOCARDIAL PERFUSION SCAN, WITH STRESS TEST, ABNORMAL 01/08/2010   PCP:  [name], MD Pharmacy:   [facility] [address] [place], [place] - [address] [address] [place] [address] Phone: [phone] Fax: [phone]     Social Drivers of Health (SDOH) Social History: SDOH Screenings   Food Insecurity: No Food Insecurity (12/17/2023)  Housing: Low Risk  (12/17/2023)  Transportation Needs: No Transportation Needs (12/17/2023)  Utilities: Not At Risk (12/17/2023)  Depression (PHQ2-9): Low Risk  (10/13/2023)  Financial Resource Strain: Low Risk  (11/17/2022)   Received from [facility]  Physical Activity: Inactive (11/17/2022)   Received from [facility]  Social Connections: Moderately Integrated  (12/17/2023)  Stress: Stress Concern Present (11/17/2022)   Received from [facility]  Tobacco Use: Low Risk  (12/17/2023)  Health Literacy: Low Risk  (11/17/2022)   Received from [facility]   SDOH Interventions:     Readmission Risk Interventions    12/19/2023    3:12 PM 10/20/2023    2:00 PM 10/13/2023    3:58 PM  Readmission Risk Prevention Plan  Post Dischage Appt   Complete  Medication Screening   Complete  Transportation Screening Complete Complete Complete  PCP or Specialist Appt within 5-7 Days Complete    PCP or Specialist Appt within 3-5 Days  Complete   Home Care Screening Complete    Medication Review (RN CM) Complete    HRI or Home Care Consult  Complete   Social Work Consult for Recovery Care Planning/Counseling  Complete   Palliative Care Screening  Not Applicable   Medication Review Oceanographer)  Complete

## 2023-12-19 NOTE — Plan of Care (Signed)
  Problem: Education: Goal: Knowledge of General Education information will improve Description: Including pain rating scale, medication(s)/side effects and non-pharmacologic comfort measures Outcome: Progressing   Problem: Activity: Goal: Risk for activity intolerance will decrease Outcome: Progressing   Problem: Coping: Goal: Level of anxiety will decrease Outcome: Progressing   Problem: Elimination: Goal: Will not experience complications related to bowel motility Outcome: Progressing Goal: Will not experience complications related to urinary retention Outcome: Progressing   Problem: Pain Managment: Goal: General experience of comfort will improve and/or be controlled Outcome: Progressing   Problem: Skin Integrity: Goal: Risk for impaired skin integrity will decrease Outcome: Progressing

## 2023-12-19 NOTE — Progress Notes (Addendum)
 Harold [name]   DOB:1935-02-11   FM#:[id number]      ASSESSMENT & PLAN:  Harold [name] is an 89 year old male patient with oncologic history significant for follicular lymphoma.  He is admitted 12/17/23 for failure to thrive, severe radiation mucositis.  Dr. Ajee Heasley/Oncology following.    Failure to Thrive Weight loss  Radiation mucositis, severe -- Patient with mouth pain, difficult to eat. -- Weight loss  -- Needs G-tube for nutrition, IR order placed.  Patient initially refused after having scheduled time for placement.  Now states he will agree to have G-tube placed.  -- On IV ancef  and IV diflucan - Seen by Dietician -- Dr. [name] following closely   Follicular lymphoma low-grade with right cervical/mandible lymphadenopathy, likely now high-grade Diffuse Large B-cell Lymphoma - Initially diagnosed 08/05/2017 with right cervical lymph node, low-grade follicular lymphoma.  --LN biopsy of right axilla done 6/11, showed low grade FL. - Facial mass biopsy done 7/30.  Morphologic features consistent with DLBCL.  -- CT neck 7/29 showed very large up to 12 cm lymphomatous mass about the right mandible, no other lymphadenopathy in the neck or thoracic inlet.  - Chemotherapy regimen R-CEOP started on 10/23/23. IV Rituximab  given outpatient. Status post 2 cycles. Mass continued to grow despite this regimen.   -- Port was previously removed due to bleeding.  -- Recommended palliative radiation therapy as mass appears refractory to above chemotherapy regimen. - Consideration for alternative systemic therapies after radiation therapy is completed.    -- Palliative following for goals of care. -- For discharge to SNF when cleared for discharge.  - Medical oncology/Dr. [name] following closely.     Chronic bilateral lower extremity edema - Compression stockings in use, patient tolerating well - Continue supportive care   Anemia -- Mild -- Hemoglobin 10.7 -- Continue to monitor CBC with  differential    Code Status DNR-Limited  Subjective:  Patient seen awake and alert. Family at bedside.  Patient reports slight improvement in mouth pain since starting oral rinses.  Patient initially refused G-tube, now states he will have it and states Don't ask me any more about it.  Very frustrated.    Objective:  No intake or output data in the 24 hours ending 12/19/23 1124   PHYSICAL EXAMINATION: ECOG PERFORMANCE STATUS: 3 - Symptomatic, >50% confined to bed  Vitals:   12/18/23 2000 12/19/23 0505  BP: (!) 118/50 (!) 112/58  Pulse: 90 80  Resp: 18 18  Temp: 98.3 F (36.8 C) 97.9 F (36.6 C)  SpO2: 96% 98%   Filed Weights   12/17/23 2036  Weight: 135 lb 9.3 oz (61.5 kg)    GENERAL: alert, no distress and comfortable SKIN:+right sided facial mass, erythematous and peeling, s/p radiation therapy.   EYES: normal, conjunctiva are pink and non-injected, sclera clear OROPHARYNX: +mucositis  NECK: supple, thyroid  normal size, non-tender, without nodularity LYMPH: no palpable lymphadenopathy in the cervical, axillary or inguinal LUNGS: clear to auscultation and percussion with normal breathing effort HEART: regular rate & rhythm and no murmurs and +2+ lower extremity edema ABDOMEN: abdomen soft, non-tender and normal bowel sounds MUSCULOSKELETAL: no cyanosis of digits and no clubbing  PSYCH: alert & oriented x 3 with fluent speech NEURO: no focal motor/sensory deficits   All questions were answered. The patient knows to call the clinic with any problems, questions or concerns.   The total time spent in the appointment was 40 minutes encounter with patient including review of chart and various  tests results, discussions about plan of care and coordination of care plan  Harold JINNY Brunner, NP 12/19/2023 11:24 AM    Labs Reviewed:  Lab Results  Component Value Date   WBC 8.5 12/17/2023   HGB 10.7 (L) 12/17/2023   HCT 34.9 (L) 12/17/2023   MCV 88.8 12/17/2023   PLT 251  12/17/2023   Recent Labs    11/25/23 1104 12/17/23 0907 12/17/23 1347  NA 143 139 139  K 3.7 3.7 3.9  CL 104 98 97*  CO2 33* 32 26  GLUCOSE 83 92 79  BUN 17 18 17   CREATININE 0.89 0.80 0.71  CALCIUM 9.0 9.3 9.6  GFRNONAA >60 >60 >60  PROT 5.8* 6.2* 5.9*  ALBUMIN 3.8 4.0 3.9  AST 13* 14* 18  ALT 6 7 7   ALKPHOS 93 52 58  BILITOT 0.3 0.8 0.8    Studies Reviewed:  CT ABDOMEN WO CONTRAST Result Date: 12/18/2023 CLINICAL DATA:  History of lymphoma and external beam radiation. Patient has developed mucositis and been losing weight. Patient is unable eat and drink. Evaluate anatomy for gastrostomy tube placement. EXAM: CT ABDOMEN WITHOUT CONTRAST TECHNIQUE: Multidetector CT imaging of the abdomen was performed following the standard protocol without IV contrast. RADIATION DOSE REDUCTION: This exam was performed according to the departmental dose-optimization program which includes automated exposure control, adjustment of the mA and/or kV according to patient size and/or use of iterative reconstruction technique. COMPARISON:  PET-CT 08/25/2023 FINDINGS: Lower chest: Lung bases are clear. Hepatobiliary: Normal appearance of the liver and gallbladder. Pancreas: Diffuse pancreatic calcifications compatible with prior inflammation. Spleen: Normal in size without focal abnormality. Adrenals/Urinary Tract: Normal adrenal glands. Normal appearance of both kidneys without hydronephrosis or stones. Stomach/Bowel: Normal appearance of the stomach. The stomach is located high in the left upper quadrant. Limited percutaneous window for gastrostomy tube placement due to the transverse colon and splenic flexure of the colon interposed between the abdominal wall and the stomach. No evidence for bowel dilatation or obstruction. Vascular/Lymphatic: Atherosclerotic disease in the abdominal aorta without aneurysm. Decreased size of the retroperitoneal lymph nodes in the periaortic stations. Index lymph node below the  left renal vessels measures 0.9 cm in the short axis on image 27/2 and measured 1.5 cm on 08/25/2023. Other: Negative for ascites. Musculoskeletal: Multilevel degenerative disc disease in the visualized spine. No acute bone abnormality. IMPRESSION: 1. Stomach is located high in the left upper quadrant of the abdomen. Limited percutaneous window for gastrostomy tube placement due to interposed colon. Recommend visualization of the colon for percutaneous gastrostomy tube placement. 2. Decreased retroperitoneal lymphadenopathy since 08/25/2023. Electronically Signed   By: Juliene Balder M.D.   On: 12/18/2023 12:01   ADDENDUM  .Patient was Personally and independently interviewed, examined and relevant elements of the history of present illness were reviewed in details and an assessment and plan was created. All elements of the patient's history of present illness , assessment and plan were discussed in details with Lisa Rouson NP. The above documentation reflects our combined findings assessment and plan.   Ilyaas Musto MD MS

## 2023-12-20 DIAGNOSIS — Z7189 Other specified counseling: Secondary | ICD-10-CM

## 2023-12-20 DIAGNOSIS — R627 Adult failure to thrive: Secondary | ICD-10-CM | POA: Diagnosis not present

## 2023-12-20 MED ORDER — DEXTROSE-SODIUM CHLORIDE 5-0.45 % IV SOLN: INTRAVENOUS | Status: AC

## 2023-12-20 NOTE — Progress Notes (Signed)
 PROGRESS NOTE    Harold [name]  FMW:[id number] DOB: 1934-07-19 DOA: 12/17/2023 PCP: [name], MD   Brief Narrative:  89 year old male with past medical history of chronic diastolic CHF, possible chronic lymphedema, hypertension, history of DVT, non-Hodgkin's lymphoma with recurrence now transformed into B-cell lymphoma of his head and neck who has been undergoing external beam radiation for the last 3 weeks complicated by mucositis and 30 pounds weight loss in the last 2 to 3 months and was referred for direct admission from oncology office for gastrostomy tube placement.  IR was consulted.  Palliative care also consulted.  Assessment & Plan:   Oral mucositis due to radiation therapy Failure to thrive in adult Diffuse large B-cell lymphoma of lymph nodes of multiple regions -Patient was undergoing external beam radiation for the last 3 weeks complicated by mucositis and 30 pounds weight loss in the last 2 to 3 months and was referred for direct admission from oncology office for gastrostomy tube placement.   -Currently on IV Diflucan. - Oncology, palliative care, IR following.  Patient keeps changing his statements regarding G-tube placement: Intermittently refuses, then agrees for it and refuses it again.  Will follow palliative care discussions with the patient and family.  IR planning for G-tube placement on Monday if patient agreeable and patient is able to drink oral contrast prior.  History of DVT - Resume Eliquis  once cleared by IR  Chronic diastolic CHF - Currently compensated.  Lasix  on hold  Bilateral lower extremity chronic edema/possible lymphedema - Apparently, current level of edema is actually better than normal for patient.  May benefit from compression wraps.  Lasix  on hold for now.  Hypertension - Blood pressure stable and intermittently on the lower side.  Anemia of chronic disease - From chronic illnesses.  Hemoglobin stable.  Monitor  intermittently.    DVT prophylaxis: SCDs Code Status: DNR Family Communication: None at bedside Disposition Plan: Status is: Inpatient Remains inpatient appropriate because: Of severity of illness    Consultants: Oncology/IR. palliative care  Procedures: None  Antimicrobials: Diflucan   Subjective: Patient seen and examined at bedside.  No fever, agitation or vomiting reported. Objective: Vitals:   12/19/23 1338 12/19/23 2040 12/19/23 2041 12/20/23 0518  BP: (!) 122/57 (!) 113/50 (!) 114/54 (!) 107/45  Pulse: 83 92 94 (!) 101  Resp: 20 18  16   Temp: 98.7 F (37.1 C) 98.8 F (37.1 C)  99.9 F (37.7 C)  TempSrc: Oral     SpO2: 100% 93% 91% 91%  Weight:      Height:       No intake or output data in the 24 hours ending 12/20/23 0820 Filed Weights   12/17/23 2036  Weight: 61.5 kg    Examination:  General: On room air.  No distress.  Large right facial mass noted ENT/neck: No thyromegaly.  JVD is not elevated  respiratory: Decreased breath sounds at bases bilaterally with some crackles; no wheezing  CVS: S1-S2 heard, rate controlled currently Abdominal: Soft, nontender, slightly distended; no organomegaly, bowel sounds are heard Extremities: Trace lower extremity edema; no cyanosis  CNS: Sleepy, wakes up only very slightly, remains very slow to respond.  Poor historian.  No focal neurologic deficit.  Moves extremities Lymph: No obvious lymphadenopathy Skin: No obvious ecchymosis/lesions  psych: Affect is mostly flat.  Currently not agitated. musculoskeletal: No obvious joint swelling/deformity   Data Reviewed: I have personally reviewed following labs and imaging studies  CBC: Recent Labs  Lab 12/17/23 0916 12/17/23  1347  WBC 8.1 8.5  NEUTROABS 6.2 6.6  HGB 11.0* 10.7*  HCT 33.7* 34.9*  MCV 85.8 88.8  PLT 261 251   Basic Metabolic Panel: Recent Labs  Lab 12/17/23 0907 12/17/23 1347  NA 139 139  K 3.7 3.9  CL 98 97*  CO2 32 26  GLUCOSE 92 79   BUN 18 17  CREATININE 0.80 0.71  CALCIUM 9.3 9.6   GFR: Estimated Creatinine Clearance: 56.6 mL/min (by C-G formula based on SCr of 0.71 mg/dL). Liver Function Tests: Recent Labs  Lab 12/17/23 0907 12/17/23 1347  AST 14* 18  ALT 7 7  ALKPHOS 52 58  BILITOT 0.8 0.8  PROT 6.2* 5.9*  ALBUMIN 4.0 3.9   No results for input(s): LIPASE, AMYLASE in the last 168 hours. No results for input(s): AMMONIA in the last 168 hours. Coagulation Profile: Recent Labs  Lab 12/17/23 1347  INR 1.0   Cardiac Enzymes: No results for input(s): CKTOTAL, CKMB, CKMBINDEX, TROPONINI in the last 168 hours. BNP (last 3 results) No results for input(s): PROBNP in the last 8760 hours. HbA1C: No results for input(s): HGBA1C in the last 72 hours. CBG: No results for input(s): GLUCAP in the last 168 hours. Lipid Profile: No results for input(s): CHOL, HDL, LDLCALC, TRIG, CHOLHDL, LDLDIRECT in the last 72 hours. Thyroid  Function Tests: No results for input(s): TSH, T4TOTAL, FREET4, T3FREE, THYROIDAB in the last 72 hours. Anemia Panel: No results for input(s): VITAMINB12, FOLATE, FERRITIN, TIBC, IRON, RETICCTPCT in the last 72 hours. Sepsis Labs: No results for input(s): PROCALCITON, LATICACIDVEN in the last 168 hours.  No results found for this or any previous visit (from the past 240 hours).       Radiology Studies: No results found.       Scheduled Meds:  magic mouthwash w/lidocaine   5 mL Oral QID   Continuous Infusions:  [START ON 12/22/2023]  ceFAZolin  (ANCEF ) IV     fluconazole (DIFLUCAN) IV 200 mg (12/19/23 1501)          Sophie Mao, MD [facility] 12/20/2023, 8:20 AM

## 2023-12-20 NOTE — Progress Notes (Signed)
 PMT brief progress note.   Resting in bed, does not awaken to gentle touch/voice command. Has mucositis Mouth breathing BP (!) 107/45 (BP Location: Right Arm)   Pulse (!) 101   Temp 99.9 F (37.7 C)   Resp 16   Ht 5' 10 (1.778 m)   Wt 61.5 kg   SpO2 91%   BMI 19.45 kg/m  No non verbal gestures of pain or discomfort noted.  89 year old male with past medical history of chronic diastolic CHF, possible chronic lymphedema, hypertension, history of DVT, non-Hodgkin's lymphoma with recurrence now transformed into B-cell lymphoma of his head and neck who has been undergoing external beam radiation for the last 3 weeks complicated by mucositis and 30 pounds weight loss in the last 2 to 3 months has been admitted to hospital medicine service with medical oncology closely following, additionally, IR has been involved as patient is under consideration for gastrostomy tube placement.     A palliative consult has been requested for ongoing goals of care discussions.  Plan: Continue current mucositis mouthwash and good oral care regimen.  As of now, plan remains for G-tube placement on Monday if patient agreeable and patient is able to drink oral contrast prior.  If not, we will proceed with comfort measures/hospice evaluation if that is the patient's wishes.  PMT to follow Low MDM Lonia Serve MD [facility] palliative.

## 2023-12-20 NOTE — Plan of Care (Signed)
   Problem: Activity: Goal: Risk for activity intolerance will decrease Outcome: Progressing   Problem: Coping: Goal: Level of anxiety will decrease Outcome: Progressing   Problem: Safety: Goal: Ability to remain free from injury will improve Outcome: Progressing

## 2023-12-21 DIAGNOSIS — E43 Unspecified severe protein-calorie malnutrition: Secondary | ICD-10-CM | POA: Diagnosis not present

## 2023-12-21 DIAGNOSIS — Z7189 Other specified counseling: Secondary | ICD-10-CM | POA: Diagnosis not present

## 2023-12-21 DIAGNOSIS — K1233 Oral mucositis (ulcerative) due to radiation: Secondary | ICD-10-CM | POA: Diagnosis not present

## 2023-12-21 DIAGNOSIS — R627 Adult failure to thrive: Secondary | ICD-10-CM | POA: Diagnosis not present

## 2023-12-21 MED ORDER — DEXTROSE-SODIUM CHLORIDE 5-0.45 % IV SOLN: INTRAVENOUS | Status: AC

## 2023-12-21 NOTE — Progress Notes (Signed)
 Harold [name]   DOB:May 09, 1934   FM#:[id number]   Hematology oncology inpatient progress note Date of service 12/21/2023  Subjective:  Patient was seen with his daughter at bedside.  He notes that the morphine  helps with his mouth pain and he has been getting some rest.  Still having significant mucositis.  I discussed with the pharmacist about whether we have palifermin available and was told that this is not available in the hospital. Appreciate palliative care and hospital medicine care inputs for symptom management. The patient daughter is struggling with her difficult relationship with her father and needing to step in to help make decisions.  At this time the patient notes that he is willing to consider the G-tube tomorrow and that he would drink the contrast.  Over the last few days he has been vacillating in his thought process.  Objective:   Intake/Output Summary (Last 24 hours) at 12/21/2023 1829 Last data filed at 12/21/2023 1658 Gross per 24 hour  Intake 431.6 ml  Output 700 ml  Net -268.4 ml     PHYSICAL EXAMINATION: ECOG PERFORMANCE STATUS: 3 - Symptomatic, >50% confined to bed  Vitals:   12/21/23 0515 12/21/23 1350  BP: (!) 113/56 122/69  Pulse: 99 94  Resp: 18 16  Temp: 98.5 F (36.9 C) 98.3 F (36.8 C)  SpO2: 92% 94%   Filed Weights   12/17/23 2036  Weight: 135 lb 9.3 oz (61.5 kg)  Mild distress from being in the hospital and pain from oral mucositis GENERAL:alert OROPHARYNX: Significant oral mucositis  NECK: Right neck mass is shrinking LYMPH:  no palpable lymphadenopathy in the cervical, axillary or inguinal regions LUNGS: clear to auscultation b/l with normal respiratory effort HEART: regular rate & rhythm ABDOMEN:  normoactive bowel sounds , non tender, not distended. Extremity: 1+ bilateral pitting pedal edema PSYCH: alert & oriented x 3 with rambling speech NEURO: no focal motor/sensory deficits    Labs Reviewed:  Lab Results  Component Value Date    WBC 8.5 12/17/2023   HGB 10.7 (L) 12/17/2023   HCT 34.9 (L) 12/17/2023   MCV 88.8 12/17/2023   PLT 251 12/17/2023   Recent Labs    11/25/23 1104 12/17/23 0907 12/17/23 1347  NA 143 139 139  K 3.7 3.7 3.9  CL 104 98 97*  CO2 33* 32 26  GLUCOSE 83 92 79  BUN 17 18 17   CREATININE 0.89 0.80 0.71  CALCIUM 9.0 9.3 9.6  GFRNONAA >60 >60 >60  PROT 5.8* 6.2* 5.9*  ALBUMIN 3.8 4.0 3.9  AST 13* 14* 18  ALT 6 7 7   ALKPHOS 93 52 58  BILITOT 0.3 0.8 0.8    Studies Reviewed:  CT ABDOMEN WO CONTRAST Result Date: 12/18/2023 CLINICAL DATA:  History of lymphoma and external beam radiation. Patient has developed mucositis and been losing weight. Patient is unable eat and drink. Evaluate anatomy for gastrostomy tube placement. EXAM: CT ABDOMEN WITHOUT CONTRAST TECHNIQUE: Multidetector CT imaging of the abdomen was performed following the standard protocol without IV contrast. RADIATION DOSE REDUCTION: This exam was performed according to the departmental dose-optimization program which includes automated exposure control, adjustment of the mA and/or kV according to patient size and/or use of iterative reconstruction technique. COMPARISON:  PET-CT 08/25/2023 FINDINGS: Lower chest: Lung bases are clear. Hepatobiliary: Normal appearance of the liver and gallbladder. Pancreas: Diffuse pancreatic calcifications compatible with prior inflammation. Spleen: Normal in size without focal abnormality. Adrenals/Urinary Tract: Normal adrenal glands. Normal appearance of both kidneys without hydronephrosis  or stones. Stomach/Bowel: Normal appearance of the stomach. The stomach is located high in the left upper quadrant. Limited percutaneous window for gastrostomy tube placement due to the transverse colon and splenic flexure of the colon interposed between the abdominal wall and the stomach. No evidence for bowel dilatation or obstruction. Vascular/Lymphatic: Atherosclerotic disease in the abdominal aorta without  aneurysm. Decreased size of the retroperitoneal lymph nodes in the periaortic stations. Index lymph node below the left renal vessels measures 0.9 cm in the short axis on image 27/2 and measured 1.5 cm on 08/25/2023. Other: Negative for ascites. Musculoskeletal: Multilevel degenerative disc disease in the visualized spine. No acute bone abnormality. IMPRESSION: 1. Stomach is located high in the left upper quadrant of the abdomen. Limited percutaneous window for gastrostomy tube placement due to interposed colon. Recommend visualization of the colon for percutaneous gastrostomy tube placement. 2. Decreased retroperitoneal lymphadenopathy since 08/25/2023. Electronically Signed   By: Juliene Balder M.D.   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  He is admitted 12/17/23 for failure to thrive, severe radiation mucositis.     Failure to Thrive Weight loss  Radiation mucositis, severe -- - On IV ancef  and IV diflucan --Palliative care is following and has him on doxepin mouthwashes, Magic mouthwash and as needed narcotics for pain management -I discussed with her pharmacist and was told that we do not have palifermin available in the hospital to be able to help with his mucositis. --Awaiting G-tube placement to optimize hydration and nutrition - Getting IV fluids -- Ongoing goals of care discussions with the patient and his daughter. 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  --LN biopsy of right axilla done 6/11, showed low grade FL. - Facial mass biopsy done 7/30.  Morphologic features consistent with DLBCL.  --  CT neck 7/29 showed very large up to 12 cm lymphomatous mass about the right mandible, no other lymphadenopathy in the neck or thoracic inlet.  - Chemotherapy regimen R-CEOP started on 10/23/23. IV Rituximab  given outpatient. Status post 2 cycles. Mass continued to grow despite this regimen.   -- Port was previously removed due to bleeding.  -- Recommended palliative radiation therapy as mass appears refractory to above chemotherapy regimen. - Consideration for alternative systemic therapies after radiation therapy is completed.    -- Palliative following for goals of care. -- For discharge to SNF when cleared for discharge.    Chronic bilateral lower extremity edema - Compression stockings in use, patient tolerating well - Continue supportive care   Anemia -- Mild --Multifactorial from chemotherapy, poor nutrition and his lymphoma  Code Status DNR-Limited   The total time spent in the appointment was 35 minutes*.  All of the patient's questions were answered with apparent satisfaction. The patient knows to call the clinic with any problems, questions or concerns.   Emaline Saran MD MS AAHIVMS [facility] Hematology/Oncology Physician [facility]  .*Total Encounter Time as defined by the Centers for Medicare and Medicaid Services includes, in addition to the face-to-face time of a patient visit (documented in the note above) non-face-to-face time: obtaining and reviewing outside history, ordering and reviewing medications, tests or procedures, care coordination (  communications with other health care professionals or caregivers) and documentation in the medical record.

## 2023-12-21 NOTE — Plan of Care (Signed)

## 2023-12-21 NOTE — Progress Notes (Signed)
 PMT brief progress note.   Resting in bed, ongoing pain and discomfort from mucositis Mouth breathing Daughter has arrived at bedside.  Chart reviewed  BP (!) 113/56 (BP Location: Left Arm)   Pulse 99   Temp 98.5 F (36.9 C) (Oral)   Resp 18   Ht 5' 10 (1.778 m)   Wt 61.5 kg   SpO2 92%   BMI 19.45 kg/m  No non verbal gestures of pain or discomfort noted.  89 year old male with past medical history of chronic diastolic CHF, possible chronic lymphedema, hypertension, history of DVT, non-Hodgkin's lymphoma with recurrence now transformed into B-cell lymphoma of his head and neck who has been undergoing external beam radiation for the last 3 weeks complicated by mucositis and 30 pounds weight loss in the last 2 to 3 months has been admitted to hospital medicine service with medical oncology closely following, additionally, IR has been involved as patient is under consideration for gastrostomy tube placement.     A palliative consult has been requested for ongoing goals of care discussions.  Plan: Continue current mucositis mouthwash and good oral care regimen.  As of now, plan remains for G-tube placement on Monday if patient agreeable and patient is able to drink oral contrast prior.  If not, we will proceed with comfort measures/hospice evaluation if that is the patient's wishes.  12-21-23: Daughter [name] arrived at bedside - she is tearful, she states that she doesn't know what to do because he keeps flip flopping. She asks about what will happen - discussed with her about the two distinct possibilities and pathways of care - PEG tube and SNF rehab with palliative versus full comfort care/residential hospice.  Daughter states she is hesitant to make the decision for him. Reassured her and discussed with her about the patient's current condition to the best of my ability.   PMT to follow High MDM Lonia Serve MD [facility] palliative.

## 2023-12-21 NOTE — Progress Notes (Signed)
 PROGRESS NOTE    Harold [name]  FMW:[id number] DOB: April 05, 1934 DOA: 12/17/2023 PCP: [name], MD   Brief Narrative:  89 year old male with past medical history of chronic diastolic CHF, possible chronic lymphedema, hypertension, history of DVT, non-Hodgkin's lymphoma with recurrence now transformed into B-cell lymphoma of his head and neck who has been undergoing external beam radiation for the last 3 weeks complicated by mucositis and 30 pounds weight loss in the last 2 to 3 months and was referred for direct admission from oncology office for gastrostomy tube placement.  IR was consulted.  Palliative care also consulted.  Assessment & Plan:   Oral mucositis due to radiation therapy Failure to thrive in adult Diffuse large B-cell lymphoma of lymph nodes of multiple regions -Patient was undergoing external beam radiation for the last 3 weeks complicated by mucositis and 30 pounds weight loss in the last 2 to 3 months and was referred for direct admission from oncology office for gastrostomy tube placement.   -Currently on IV Diflucan. - Oncology, palliative care, IR following.  Patient keeps changing his statements regarding G-tube placement: Intermittently refuses, then agrees for it and refuses it again.  Will follow palliative care discussions with the patient and family.  IR planning for G-tube placement on Monday if patient agreeable and patient is able to drink oral contrast prior. - Continue gentle hydration.  History of DVT - Resume Eliquis  once cleared by IR  Chronic diastolic CHF - Currently compensated.  Lasix  on hold  Bilateral lower extremity chronic edema/possible lymphedema - Apparently, current level of edema is actually better than normal for patient.  May benefit from compression wraps.  Lasix  on hold for now.  Hypertension - Blood pressure stable and intermittently on the lower side.  Anemia of chronic disease - From chronic illnesses.  Hemoglobin stable.   Monitor intermittently.    DVT prophylaxis: SCDs Code Status: DNR Family Communication: Daughter at bedside Disposition Plan: Status is: Inpatient Remains inpatient appropriate because: Of severity of illness    Consultants: Oncology/IR. palliative care  Procedures: None  Antimicrobials: Diflucan   Subjective: Patient seen and examined at bedside.  No seizures, fever, vomiting and agitation reported. Objective: Vitals:   12/20/23 0518 12/20/23 1353 12/20/23 2047 12/21/23 0515  BP: (!) 107/45 (!) 123/56 (!) 118/53 (!) 113/56  Pulse: (!) 101 (!) 108 96 99  Resp: 16 18 18 18   Temp: 99.9 F (37.7 C) 98.8 F (37.1 C) 98.8 F (37.1 C) 98.5 F (36.9 C)  TempSrc:  Oral Oral Oral  SpO2: 91% 94% 92% 92%  Weight:      Height:        Intake/Output Summary (Last 24 hours) at 12/21/2023 0854 Last data filed at 12/21/2023 0659 Gross per 24 hour  Intake 260.03 ml  Output 1050 ml  Net -789.97 ml   Filed Weights   12/17/23 2036  Weight: 61.5 kg    Examination:  General: No acute distress.  Remains on room air.  Elderly male lying in bed.  Large right facial mass noted ENT/neck: No JVD elevation or palpable neck masses respiratory: Bilateral decreased breath sounds at bases with scattered rales CVS: Currently rate controlled; S1 and S2 are heard  abdominal: Soft, nontender, distended mildly; no organomegaly, bowel sounds are heard normally Extremities: No clubbing; mild lower extremity edema present  CNS: Still extremely slow to respond.  Poor historian.  No focal neurologic deficit.  Able to move extremities Lymph: No obvious palpable lymphadenopathy Skin: No obvious petechiae/rashes  psych: Showing no signs of agitation.  Flat affect mostly musculoskeletal: No obvious joint tenderness/erythema   Data Reviewed: I have personally reviewed following labs and imaging studies  CBC: Recent Labs  Lab 12/17/23 0916 12/17/23 1347  WBC 8.1 8.5  NEUTROABS 6.2 6.6  HGB  11.0* 10.7*  HCT 33.7* 34.9*  MCV 85.8 88.8  PLT 261 251   Basic Metabolic Panel: Recent Labs  Lab 12/17/23 0907 12/17/23 1347  NA 139 139  K 3.7 3.9  CL 98 97*  CO2 32 26  GLUCOSE 92 79  BUN 18 17  CREATININE 0.80 0.71  CALCIUM 9.3 9.6   GFR: Estimated Creatinine Clearance: 56.6 mL/min (by C-G formula based on SCr of 0.71 mg/dL). Liver Function Tests: Recent Labs  Lab 12/17/23 0907 12/17/23 1347  AST 14* 18  ALT 7 7  ALKPHOS 52 58  BILITOT 0.8 0.8  PROT 6.2* 5.9*  ALBUMIN 4.0 3.9   No results for input(s): LIPASE, AMYLASE in the last 168 hours. No results for input(s): AMMONIA in the last 168 hours. Coagulation Profile: Recent Labs  Lab 12/17/23 1347  INR 1.0   Cardiac Enzymes: No results for input(s): CKTOTAL, CKMB, CKMBINDEX, TROPONINI in the last 168 hours. BNP (last 3 results) No results for input(s): PROBNP in the last 8760 hours. HbA1C: No results for input(s): HGBA1C in the last 72 hours. CBG: No results for input(s): GLUCAP in the last 168 hours. Lipid Profile: No results for input(s): CHOL, HDL, LDLCALC, TRIG, CHOLHDL, LDLDIRECT in the last 72 hours. Thyroid  Function Tests: No results for input(s): TSH, T4TOTAL, FREET4, T3FREE, THYROIDAB in the last 72 hours. Anemia Panel: No results for input(s): VITAMINB12, FOLATE, FERRITIN, TIBC, IRON, RETICCTPCT in the last 72 hours. Sepsis Labs: No results for input(s): PROCALCITON, LATICACIDVEN in the last 168 hours.  No results found for this or any previous visit (from the past 240 hours).       Radiology Studies: No results found.       Scheduled Meds:  magic mouthwash w/lidocaine   5 mL Oral QID   Continuous Infusions:  [START ON 12/22/2023]  ceFAZolin  (ANCEF ) IV     dextrose  5 % and 0.45 % NaCl Stopped (12/20/23 1557)   fluconazole (DIFLUCAN) IV 200 mg (12/20/23 1557)          Sophie Mao, MD [facility] 12/21/2023, 8:54 AM

## 2023-12-21 NOTE — Progress Notes (Signed)
 Pt able to consume entire bottle of barium ordered for PEG placement tomorrow without any overt signs of aspiration or other issues. Medicated for pain in mouth with morphine  IV. Spoke with pts daughter [name] and let her know Debby was able to drink the barium as ordered. Continue to monitor. Clotilda Lunch RN

## 2023-12-22 ENCOUNTER — Inpatient Hospital Stay (HOSPITAL_COMMUNITY)

## 2023-12-22 ENCOUNTER — Ambulatory Visit

## 2023-12-22 DIAGNOSIS — R627 Adult failure to thrive: Secondary | ICD-10-CM | POA: Diagnosis not present

## 2023-12-22 LAB — CBC WITH DIFFERENTIAL/PLATELET
Abs Immature Granulocytes: 0.03 K/uL (ref 0.00–0.07)
Basophils Absolute: 0 K/uL (ref 0.0–0.1)
Basophils Relative: 0 %
Eosinophils Absolute: 0 K/uL (ref 0.0–0.5)
Eosinophils Relative: 0 %
HCT: 31.7 % — ABNORMAL LOW (ref 39.0–52.0)
Hemoglobin: 9.5 g/dL — ABNORMAL LOW (ref 13.0–17.0)
Immature Granulocytes: 0 %
Lymphocytes Relative: 6 %
Lymphs Abs: 0.4 K/uL — ABNORMAL LOW (ref 0.7–4.0)
MCH: 26.9 pg (ref 26.0–34.0)
MCHC: 30 g/dL (ref 30.0–36.0)
MCV: 89.8 fL (ref 80.0–100.0)
Monocytes Absolute: 0.9 K/uL (ref 0.1–1.0)
Monocytes Relative: 14 %
Neutro Abs: 5.5 K/uL (ref 1.7–7.7)
Neutrophils Relative %: 80 %
Platelets: 183 K/uL (ref 150–400)
RBC: 3.53 MIL/uL — ABNORMAL LOW (ref 4.22–5.81)
RDW: 15.9 % — ABNORMAL HIGH (ref 11.5–15.5)
Smear Review: NORMAL
WBC: 7 K/uL (ref 4.0–10.5)
nRBC: 0 % (ref 0.0–0.2)

## 2023-12-22 MED ORDER — CEFAZOLIN SODIUM-DEXTROSE 2-4 GM/100ML-% IV SOLN: 2.0000 g | INTRAVENOUS | Status: DC

## 2023-12-22 NOTE — Plan of Care (Signed)
  Problem: Education: Goal: Knowledge of General Education information will improve Description: Including pain rating scale, medication(s)/side effects and non-pharmacologic comfort measures Outcome: Progressing   Problem: Health Behavior/Discharge Planning: Goal: Ability to manage health-related needs will improve Outcome: Progressing   Problem: Clinical Measurements: Goal: Ability to maintain clinical measurements within normal limits will improve Outcome: Progressing Goal: Will remain free from infection Outcome: Progressing Goal: Diagnostic test results will improve Outcome: Progressing Goal: Respiratory complications will improve Outcome: Progressing Goal: Cardiovascular complication will be avoided Outcome: Progressing   Problem: Activity: Goal: Risk for activity intolerance will decrease Outcome: Progressing   Problem: Pain Managment: Goal: General experience of comfort will improve and/or be controlled Outcome: Progressing   Problem: Safety: Goal: Ability to remain free from injury will improve Outcome: Progressing   Problem: Skin Integrity: Goal: Risk for impaired skin integrity will decrease Outcome: Progressing

## 2023-12-22 NOTE — Progress Notes (Signed)
 PROGRESS NOTE    Harold [name]  FMW:[id number] DOB: [birth year] DOA: 12/17/2023 PCP: [name], MD   Brief Narrative:  89 year old male with past medical history of chronic diastolic CHF, possible chronic lymphedema, hypertension, history of DVT, non-Hodgkin's lymphoma with recurrence now transformed into B-cell lymphoma of his head and neck who has been undergoing external beam radiation for the last 3 weeks complicated by mucositis and 30 pounds weight loss in the last 2 to 3 months and was referred for direct admission from oncology office for gastrostomy tube placement.  IR was consulted.  Palliative care also consulted.  Assessment & Plan:   Oral mucositis due to radiation therapy Failure to thrive in adult Diffuse large B-cell lymphoma of lymph nodes of multiple regions -Patient was undergoing external beam radiation for the last 3 weeks complicated by mucositis and 30 pounds weight loss in the last 2 to 3 months and was referred for direct admission from oncology office for gastrostomy tube placement.   -Currently on IV Diflucan. - Oncology, palliative care, IR following.  Patient keeps changing his statements regarding G-tube placement: Intermittently refuses, then agrees for it and refuses it again.  Currently agreeable for the same.  Will follow palliative care discussions with the patient and family.  IR planning for G-tube placement today. - Continue gentle hydration.  History of DVT - Resume Eliquis  once cleared by IR  Chronic diastolic CHF - Currently compensated.  Lasix  on hold  Bilateral lower extremity chronic edema/possible lymphedema - Apparently, current level of edema is actually better than normal for patient.  May benefit from compression wraps.  Lasix  on hold for now.  Hypertension - Blood pressure stable and intermittently on the lower side.  Anemia of chronic disease - From chronic illnesses.  Hemoglobin stable.  Monitor intermittently.    DVT  prophylaxis: SCDs Code Status: DNR Family Communication: Daughter at bedside  Disposition Plan: Status is: Inpatient Remains inpatient appropriate because: Of severity of illness    Consultants: Oncology/IR. palliative care  Procedures: None  Antimicrobials: Diflucan   Subjective: Patient seen and examined at bedside.  No agitation, vomiting, shortness of breath reported  objective: Vitals:   12/21/23 0515 12/21/23 1350 12/21/23 2134 12/22/23 0718  BP: (!) 113/56 122/69 (!) 116/59 (!) 100/48  Pulse: 99 94 (!) 103 86  Resp: 18 16 18 18   Temp: 98.5 F (36.9 C) 98.3 F (36.8 C) 99.1 F (37.3 C) 98.2 F (36.8 C)  TempSrc: Oral Axillary Oral Oral  SpO2: 92% 94% 90% 98%  Weight:      Height:        Intake/Output Summary (Last 24 hours) at 12/22/2023 0850 Last data filed at 12/21/2023 1658 Gross per 24 hour  Intake 171.57 ml  Output 300 ml  Net -128.43 ml   Filed Weights   12/17/23 2036  Weight: 61.5 kg    Examination:  General: Normal.  No distress.  Elderly male lying in bed.  Large right facial mass noted ENT/neck: No obvious thyromegaly or elevated JVD noted  respiratory: Decreased breath sounds at bases bilaterally with some crackles CVS: 1 S2 heard; rate mostly controlled  abdominal: Soft, nontender, distended slightly; no organomegaly, normal bowel sounds are heard  extremities: Trace lower extremity edema; no cyanosis CNS: Remains slow to respond.  Extremity poor historian.  No obvious focal neurologic deficit.  Lymph: No palpable lymphadenopathy Skin: No obvious ecchymosis/lesions psych: Extremely flat affect with no signs of agitation currently  musculoskeletal: No obvious joint swelling/deformity  Data Reviewed: I have personally reviewed following labs and imaging studies  CBC: Recent Labs  Lab 12/17/23 0916 12/17/23 1347 12/22/23 0630  WBC 8.1 8.5 7.0  NEUTROABS 6.2 6.6 5.5  HGB 11.0* 10.7* 9.5*  HCT 33.7* 34.9* 31.7*  MCV 85.8 88.8 89.8   PLT 261 251 183   Basic Metabolic Panel: Recent Labs  Lab 12/17/23 0907 12/17/23 1347  NA 139 139  K 3.7 3.9  CL 98 97*  CO2 32 26  GLUCOSE 92 79  BUN 18 17  CREATININE 0.80 0.71  CALCIUM 9.3 9.6   GFR: Estimated Creatinine Clearance: 56.6 mL/min (by C-G formula based on SCr of 0.71 mg/dL). Liver Function Tests: Recent Labs  Lab 12/17/23 0907 12/17/23 1347  AST 14* 18  ALT 7 7  ALKPHOS 52 58  BILITOT 0.8 0.8  PROT 6.2* 5.9*  ALBUMIN 4.0 3.9   No results for input(s): LIPASE, AMYLASE in the last 168 hours. No results for input(s): AMMONIA in the last 168 hours. Coagulation Profile: Recent Labs  Lab 12/17/23 1347  INR 1.0   Cardiac Enzymes: No results for input(s): CKTOTAL, CKMB, CKMBINDEX, TROPONINI in the last 168 hours. BNP (last 3 results) No results for input(s): PROBNP in the last 8760 hours. HbA1C: No results for input(s): HGBA1C in the last 72 hours. CBG: No results for input(s): GLUCAP in the last 168 hours. Lipid Profile: No results for input(s): CHOL, HDL, LDLCALC, TRIG, CHOLHDL, LDLDIRECT in the last 72 hours. Thyroid  Function Tests: No results for input(s): TSH, T4TOTAL, FREET4, T3FREE, THYROIDAB in the last 72 hours. Anemia Panel: No results for input(s): VITAMINB12, FOLATE, FERRITIN, TIBC, IRON, RETICCTPCT in the last 72 hours. Sepsis Labs: No results for input(s): PROCALCITON, LATICACIDVEN in the last 168 hours.  No results found for this or any previous visit (from the past 240 hours).       Radiology Studies: No results found.       Scheduled Meds:  magic mouthwash w/lidocaine   5 mL Oral QID   Continuous Infusions:   ceFAZolin  (ANCEF ) IV     dextrose  5 % and 0.45 % NaCl 50 mL/hr at 12/21/23 1123   fluconazole (DIFLUCAN) IV 200 mg (12/21/23 1302)          Sophie Mao, MD [facility] 12/22/2023, 8:50 AM

## 2023-12-22 NOTE — Progress Notes (Signed)
 I spoke with the patient's daughter and his mucositis has become unbearable and the patient is refusing any additional radiation.  He is currently hospitalized for failure to thrive and getting a feeding tube tomorrow.  I let her know that I would communicate this to team and ask staff to end his treatment plan so that is why we will no longer show up in my chart.

## 2023-12-22 NOTE — TOC Progression Note (Signed)
 Transition of Care [facility]) - Progression Note    Patient Details  Name: Harold [name] MRN: [id number] Date of Birth: [birth year]  Transition of Care [facility]) CM/SW Contact  Toy LITTIE Agar, RN Phone [phone]  12/22/2023, 3:36 PM  Clinical Narrative:    Inpatient care manager acknowledges consult for HH/DME needs/ SNF placement. Patient has now agreed with PEG tube. Will follow up for disposition planning.     Expected Discharge Plan: Skilled Nursing Facility Barriers to Discharge: Continued Medical Work up               Expected Discharge Plan and Services In-house Referral: NA Discharge Planning Services: CM Consult Post Acute Care Choice: Residential Hospice Bed, Nursing Home, Hospice Living arrangements for the past 2 months: Single Family Home                 DME Arranged: N/A DME Agency: NA       HH Arranged: NA HH Agency: NA         Social Drivers of Health (SDOH) Interventions SDOH Screenings   Food Insecurity: No Food Insecurity (12/17/2023)  Housing: Low Risk  (12/17/2023)  Transportation Needs: No Transportation Needs (12/17/2023)  Utilities: Not At Risk (12/17/2023)  Depression (PHQ2-9): Low Risk  (10/13/2023)  Financial Resource Strain: Low Risk  (11/17/2022)   Received from [facility]  Physical Activity: Inactive (11/17/2022)   Received from [facility]  Social Connections: Moderately Integrated (12/17/2023)  Stress: Stress Concern Present (11/17/2022)   Received from [facility]  Tobacco Use: Low Risk  (12/17/2023)  Health Literacy: Low Risk  (11/17/2022)   Received from [facility]    Readmission Risk Interventions    12/19/2023    3:12 PM 10/20/2023    2:00 PM 10/13/2023    3:58 PM  Readmission Risk Prevention Plan  Post Dischage Appt   Complete  Medication Screening   Complete  Transportation Screening Complete Complete Complete  PCP or Specialist Appt within 5-7 Days Complete    PCP or Specialist Appt within 3-5 Days   Complete   Home Care Screening Complete    Medication Review (RN CM) Complete    HRI or Home Care Consult  Complete   Social Work Consult for Recovery Care Planning/Counseling  Complete   Palliative Care Screening  Not Applicable   Medication Review Oceanographer)  Complete

## 2023-12-22 NOTE — Progress Notes (Signed)
 PT Cancellation Note  Patient Details Name: OBEDIAH WELLES MRN: [id number] DOB: 1934/10/25   Cancelled Treatment:    Reason Eval/Treat Not Completed: Fatigue/lethargy limiting ability to participate Patient sleeping, duaghtr present, waiting on PEG placement.  Darice Potters PT Acute Rehabilitation Services Office [phone]'   Potters Darice Norris 12/22/2023, 2:34 PM

## 2023-12-22 NOTE — Progress Notes (Signed)
 PMT brief progress note.   Resting in bed, awake alert, no distress Patient took the oral contrast last night and is in agreement to proceed with PEG tube placement.  Daughter is at bedside.  Chart reviewed  BP (!) 100/48 (BP Location: Right Arm)   Pulse 86   Temp 98.2 F (36.8 C) (Oral)   Resp 18   Ht 5' 10 (1.778 m)   Wt 61.5 kg   SpO2 98%   BMI 19.89 kg/m    89 year old male with past medical history of chronic diastolic CHF, possible chronic lymphedema, hypertension, history of DVT, non-Hodgkin's lymphoma with recurrence now transformed into B-cell lymphoma of his head and neck who has been undergoing external beam radiation for the last 3 weeks complicated by mucositis and 30 pounds weight loss in the last 2 to 3 months has been admitted to hospital medicine service with medical oncology closely following, additionally, IR has been involved as patient is under consideration for gastrostomy tube placement.     A palliative consult has been requested for ongoing goals of care discussions.  Plan: Continue current mucositis mouthwash and good oral care regimen.  As of now, plan remains for G-tube placement on Monday if patient agreeable and patient was able to drink oral contrast prior.  SNF rehab with palliative Recommend outpatient palliative care follow up at the cancer center.  PMT to follow High MDM Lonia Serve MD [facility] palliative.

## 2023-12-23 ENCOUNTER — Ambulatory Visit

## 2023-12-23 ENCOUNTER — Inpatient Hospital Stay (HOSPITAL_COMMUNITY)

## 2023-12-23 DIAGNOSIS — Z515 Encounter for palliative care: Secondary | ICD-10-CM | POA: Diagnosis not present

## 2023-12-23 DIAGNOSIS — Z79899 Other long term (current) drug therapy: Secondary | ICD-10-CM

## 2023-12-23 DIAGNOSIS — Y842 Radiological procedure and radiotherapy as the cause of abnormal reaction of the patient, or of later complication, without mention of misadventure at the time of the procedure: Principal | ICD-10-CM

## 2023-12-23 DIAGNOSIS — R52 Pain, unspecified: Secondary | ICD-10-CM

## 2023-12-23 DIAGNOSIS — Z7189 Other specified counseling: Secondary | ICD-10-CM

## 2023-12-23 DIAGNOSIS — R627 Adult failure to thrive: Secondary | ICD-10-CM | POA: Diagnosis not present

## 2023-12-23 DIAGNOSIS — K1233 Oral mucositis (ulcerative) due to radiation: Secondary | ICD-10-CM | POA: Diagnosis not present

## 2023-12-23 DIAGNOSIS — Z66 Do not resuscitate: Secondary | ICD-10-CM | POA: Diagnosis not present

## 2023-12-23 MED ORDER — PHENYLEPHRINE HCL 10 % OP SOLN: Freq: Once | OPHTHALMIC | Status: DC

## 2023-12-23 MED ORDER — POLYVINYL ALCOHOL 1.4 % OP SOLN: 1.0000 [drp] | Freq: Four times a day (QID) | OPHTHALMIC | Status: DC | PRN

## 2023-12-23 MED ORDER — HALOPERIDOL LACTATE 5 MG/ML IJ SOLN
1.0000 mg | INTRAMUSCULAR | Status: DC | PRN
  Administered 2023-12-24: 1 mg via INTRAVENOUS
  Filled 2023-12-23: qty 1

## 2023-12-23 MED ORDER — GLYCOPYRROLATE 0.2 MG/ML IJ SOLN: 0.2000 mg | INTRAMUSCULAR | Status: DC | PRN

## 2023-12-23 MED ORDER — LIDOCAINE HCL 4 % EX SOLN
Freq: Once | CUTANEOUS | Status: AC
  Filled 2023-12-23: qty 50

## 2023-12-23 MED ORDER — BIOTENE DRY MOUTH MT LIQD: 15.0000 mL | OROMUCOSAL | Status: DC | PRN

## 2023-12-23 MED ORDER — MORPHINE SULFATE (PF) 2 MG/ML IV SOLN
2.0000 mg | INTRAVENOUS | Status: DC | PRN
Start: 2023-12-23 — End: 2023-12-24
  Administered 2023-12-23 – 2023-12-24 (×4): 2 mg via INTRAVENOUS
  Filled 2023-12-23 (×4): qty 1

## 2023-12-23 NOTE — Plan of Care (Signed)
  Problem: Education: Goal: Knowledge of General Education information will improve Description: Including pain rating scale, medication(s)/side effects and non-pharmacologic comfort measures Outcome: Not Progressing   Problem: Health Behavior/Discharge Planning: Goal: Ability to manage health-related needs will improve Outcome: Not Progressing   Problem: Clinical Measurements: Goal: Ability to maintain clinical measurements within normal limits will improve Outcome: Not Progressing   Problem: Clinical Measurements: Goal: Ability to maintain clinical measurements within normal limits will improve Outcome: Not Progressing Goal: Will remain free from infection Outcome: Not Progressing Goal: Diagnostic test results will improve Outcome: Not Progressing Goal: Respiratory complications will improve Outcome: Not Progressing Goal: Cardiovascular complication will be avoided Outcome: Not Progressing   Problem: Activity: Goal: Risk for activity intolerance will decrease Outcome: Not Progressing   Problem: Nutrition: Goal: Adequate nutrition will be maintained Outcome: Not Progressing   Problem: Coping: Goal: Level of anxiety will decrease Outcome: Not Progressing   Problem: Elimination: Goal: Will not experience complications related to bowel motility Outcome: Not Progressing Goal: Will not experience complications related to urinary retention Outcome: Not Progressing   Problem: Skin Integrity: Goal: Risk for impaired skin integrity will decrease Outcome: Not Progressing   Problem: Role Relationship: Goal: Family's ability to cope with current situation will improve Outcome: Not Progressing Goal: Ability to verbalize concerns, feelings, and thoughts to partner or family member will improve Outcome: Not Progressing

## 2023-12-23 NOTE — Progress Notes (Signed)
 PT Cancellation Note  Patient Details Name: Harold [name] MRN: [id number] DOB: 02-27-1935   Cancelled Treatment:     Per RN, Pt has changed to Comfort Measures.  Will update LPT   Katheryn Leap  PTA Acute  Rehabilitation Services Office M-F          [phone]

## 2023-12-23 NOTE — Progress Notes (Signed)
 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  He is admitted 12/17/23 for failure to thrive, severe radiation mucositis.  Medical Oncology following closely.     Failure to Thrive Weight loss  Radiation mucositis, severe -- Remains on IV ancef  and IV diflucan --Palliative care is following and has him on doxepin mouthwashes, Magic mouthwash and as needed narcotics for pain management -- Rechecked with Oncology pharmacist today and informed no palifermin available here or [facility] to help with mucositis. -- Patient initially agreed to G-tube placement to optimize his nutrition and hydration and is now declining.   -- States he prefers to go on hospice and wants no more treatment. -- Palliative following with discussions in progress regarding hospice care and facilities.    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  --LN biopsy of right axilla done 6/11, showed low grade FL. - Facial mass biopsy done 7/30.  Morphologic features consistent with DLBCL.  -- CT neck 7/29 showed very large up to 12 cm lymphomatous mass about the right mandible, no other lymphadenopathy in the neck or thoracic inlet.  - Chemotherapy regimen R-CEOP started 8/7. IV Rituximab  given outpatient. Status post 2 cycles. Mass continued to grow despite this regimen.   -- Port was previously removed due to bleeding.  -- Recommended palliative radiation therapy as mass appears refractory to above chemotherapy regimen. Patient is now refusing any further radiation therapy.  -- Palliative has been following -- Patient expressing desire for discharge to hospice. Patient's daughter  and her spouse at bedside.      Chronic bilateral LE edema - Compression stockings in use - Continue supportive care   Anemia of chronic disease --Mild. Hgb stable 9.5 --Multifactorial from chemotherapy, poor nutrition and his lymphoma    Code Status DNR-Limited  Subjective:  Patient seen awake and alert laying in bed. His daughter and son in law both at bedside.  Patient is adamant and states he wants no more radiation therapy or chemotherapy treatments.  He does not want G-tube for feedings.  States that he wants to go to hospice and that I'm done with all of this.  Listened empathetically.  Patient's daughter is teary, initially did not want him to get any pain meds stating he needs to sign for his hospice transfer.     Objective:   Intake/Output Summary (Last 24 hours) at 12/23/2023 1220 Last data filed at 12/22/2023 1706 Gross per 24 hour  Intake 700 ml  Output --  Net 700 ml     PHYSICAL EXAMINATION: ECOG PERFORMANCE STATUS: 4 - Bedbound  Vitals:   12/22/23 2051 12/23/23 0504  BP: (!) 98/51 (!) 107/58  Pulse: 99 86  Resp: 16 18  Temp: 98.6 F (37 C) 98 F (36.7 C)  SpO2: 94% 94%   Filed Weights   12/17/23 2036  Weight: 135 lb 9.3 oz (61.5 kg)    GENERAL: alert, +ill appearing +frail SKIN: +right sided facial/neck mass EYES: normal, conjunctiva are pink and non-injected, sclera clear OROPHARYNX: no exudate, no erythema and lips, buccal mucosa, and tongue normal  NECK: supple, thyroid  normal size, non-tender, without nodularity LYMPH: no palpable lymphadenopathy in the cervical, axillary or inguinal LUNGS: clear  to auscultation and percussion with normal breathing effort HEART: regular rate & rhythm and no murmurs and +bil LE edema 1+ ABDOMEN: abdomen soft, non-tender and normal bowel sounds MUSCULOSKELETAL: no cyanosis of digits and no clubbing  PSYCH: alert & oriented x 3 with fluent speech NEURO: no focal motor/sensory deficits   All questions were  answered. The patient knows to call the clinic with any problems, questions or concerns.   The total time spent in the appointment was 30 minutes encounter with patient including review of chart and various tests results, discussions about plan of care and coordination of care plan  [name], NP 12/23/2023 12:20 PM    Labs Reviewed:  Lab Results  Component Value Date   WBC 7.0 12/22/2023   HGB 9.5 (L) 12/22/2023   HCT 31.7 (L) 12/22/2023   MCV 89.8 12/22/2023   PLT 183 12/22/2023   Recent Labs    11/25/23 1104 12/17/23 0907 12/17/23 1347  NA 143 139 139  K 3.7 3.7 3.9  CL 104 98 97*  CO2 33* 32 26  GLUCOSE 83 92 79  BUN 17 18 17   CREATININE 0.89 0.80 0.71  CALCIUM 9.0 9.3 9.6  GFRNONAA >60 >60 >60  PROT 5.8* 6.2* 5.9*  ALBUMIN 3.8 4.0 3.9  AST 13* 14* 18  ALT 6 7 7   ALKPHOS 93 52 58  BILITOT 0.3 0.8 0.8    Studies Reviewed:  DG Abd Portable 1V Result Date: 12/22/2023 CLINICAL DATA:  14941 Gastrostomy status (HCC) 14941. EXAM: PORTABLE ABDOMEN - 1 VIEW COMPARISON:  None Available. FINDINGS: The bowel gas pattern is non-obstructive. There is positive contrast in the colon. No evidence of pneumoperitoneum, within the limitations of a supine film. No acute osseous abnormalities. Multiple dystrophic calcifications overlying the left upper abdomen corresponds to pancreatic calcifications on the prior CT scan. The soft tissues are otherwise within normal limits. Surgical changes, devices, tubes and lines: No gastrostomy tube noted. Correlate with surgical history. IMPRESSION: Nonobstructive bowel gas pattern. Electronically Signed   By: Ree Molt M.D.   On: 12/22/2023 15:55   CT ABDOMEN WO CONTRAST Result Date: 12/18/2023 CLINICAL DATA:  History of lymphoma and external beam radiation. Patient has developed mucositis and been losing weight. Patient is unable eat and drink. Evaluate anatomy for gastrostomy tube placement. EXAM: CT ABDOMEN WITHOUT CONTRAST TECHNIQUE:  Multidetector CT imaging of the abdomen was performed following the standard protocol without IV contrast. RADIATION DOSE REDUCTION: This exam was performed according to the departmental dose-optimization program which includes automated exposure control, adjustment of the mA and/or kV according to patient size and/or use of iterative reconstruction technique. COMPARISON:  PET-CT 08/25/2023 FINDINGS: Lower chest: Lung bases are clear. Hepatobiliary: Normal appearance of the liver and gallbladder. Pancreas: Diffuse pancreatic calcifications compatible with prior inflammation. Spleen: Normal in size without focal abnormality. Adrenals/Urinary Tract: Normal adrenal glands. Normal appearance of both kidneys without hydronephrosis or stones. Stomach/Bowel: Normal appearance of the stomach. The stomach is located high in the left upper quadrant. Limited percutaneous window for gastrostomy tube placement due to the transverse colon and splenic flexure of the colon interposed between the abdominal wall and the stomach. No evidence for bowel dilatation or obstruction. Vascular/Lymphatic: Atherosclerotic disease in the abdominal aorta without aneurysm. Decreased size of the retroperitoneal lymph nodes in the periaortic stations. Index lymph node below the left renal vessels measures 0.9 cm in the short axis on image 27/2 and measured 1.5 cm on 08/25/2023. Other: Negative for ascites. Musculoskeletal: Multilevel  degenerative disc disease in the visualized spine. No acute bone abnormality. IMPRESSION: 1. Stomach is located high in the left upper quadrant of the abdomen. Limited percutaneous window for gastrostomy tube placement due to interposed colon. Recommend visualization of the colon for percutaneous gastrostomy tube placement. 2. Decreased retroperitoneal lymphadenopathy since 08/25/2023. Electronically Signed   By: Juliene Balder M.D.   On: 12/18/2023 12:01

## 2023-12-23 NOTE — Progress Notes (Signed)
 Patient ID: Harold [name], male   DOB: [birth year], 89 y.o.   MRN: [id number] IR transporter went to get pt today for his G tube placement and pt does not want G tube placed now. Order canceled. Please reach out to IR again at [phone] if needed.

## 2023-12-23 NOTE — Progress Notes (Signed)
 PROGRESS NOTE    Harold [name]  FMW:[id number] DOB: June 18, 1934 DOA: 12/17/2023 PCP: [name], MD   Brief Narrative:  89 year old male with past medical history of chronic diastolic CHF, possible chronic lymphedema, hypertension, history of DVT, non-Hodgkin's lymphoma with recurrence now transformed into B-cell lymphoma of his head and neck who has been undergoing external beam radiation for the last 3 weeks complicated by mucositis and 30 pounds weight loss in the last 2 to 3 months and was referred for direct admission from oncology office for gastrostomy tube placement.  IR was consulted.  Palliative care also consulted.  Assessment & Plan:   Oral mucositis due to radiation therapy Failure to thrive in adult Diffuse large B-cell lymphoma of lymph nodes of multiple regions -Patient was undergoing external beam radiation for the last 3 weeks complicated by mucositis and 30 pounds weight loss in the last 2 to 3 months and was referred for direct admission from oncology office for gastrostomy tube placement.   -Currently on IV Diflucan. - Oncology, palliative care, IR following.  Patient keeps changing his statements regarding G-tube placement: Intermittently refuses, then agrees for it and refuses it again.  Currently agreeable for the same. IR planning for G-tube placement today. - Continue gentle hydration.  History of DVT - Resume Eliquis  once cleared by IR  Chronic diastolic CHF - Currently compensated.  Lasix  on hold  Bilateral lower extremity chronic edema/possible lymphedema - Apparently, current level of edema is actually better than normal for patient.  May benefit from compression wraps.  Lasix  on hold for now.  Hypertension - Blood pressure stable and intermittently on the lower side.  Anemia of chronic disease - From chronic illnesses.  Hemoglobin stable.  Monitor intermittently.    DVT prophylaxis: SCDs Code Status: DNR Family Communication: Daughter and son in  law at bedside  Disposition Plan: Status is: Inpatient Remains inpatient appropriate because: Of severity of illness    Consultants: Oncology/IR. palliative care  Procedures: None  Antimicrobials: Diflucan   Subjective: Patient seen and examined at bedside.  No fever, worsening shortness of breath, vomiting reported  objective: Vitals:   12/22/23 0718 12/22/23 1135 12/22/23 2051 12/23/23 0504  BP: (!) 100/48 124/68 (!) 98/51 (!) 107/58  Pulse: 86 (!) 109 99 86  Resp: 18  16 18   Temp: 98.2 F (36.8 C) 98.8 F (37.1 C) 98.6 F (37 C) 98 F (36.7 C)  TempSrc: Oral Oral Oral Oral  SpO2: 98% 92% 94% 94%  Weight:      Height:        Intake/Output Summary (Last 24 hours) at 12/23/2023 0747 Last data filed at 12/22/2023 1706 Gross per 24 hour  Intake 700 ml  Output --  Net 700 ml   Filed Weights   12/17/23 2036  Weight: 61.5 kg    Examination:  General: No acute distress.  On room air currently.  Chronically ill and deconditioned looking.  Elderly male lying in bed.  Large right facial mass noted ENT/neck: No JVD elevation or palpable neck masses noted  respiratory: Bilateral decreased breath sounds at bases with scattered crackles CVS: Rate currently controlled; S1 and S2 are heard  abdominal: Soft, nontender, still slightly distended; no organomegaly, bowel sounds are heard normally  extremities: No clubbing; mild lower extremity edema present  CNS: Wakes up slightly; still slow to respond and a poor historian.  No focal deficits noted  lymph: No palpable lymphadenopathy noted Skin: No obvious petechiae/rashes psych: Affect remains flat and currently  not agitated musculoskeletal: No obvious joint tenderness/erythema  Data Reviewed: I have personally reviewed following labs and imaging studies  CBC: Recent Labs  Lab 12/17/23 0916 12/17/23 1347 12/22/23 0630  WBC 8.1 8.5 7.0  NEUTROABS 6.2 6.6 5.5  HGB 11.0* 10.7* 9.5*  HCT 33.7* 34.9* 31.7*  MCV 85.8 88.8  89.8  PLT 261 251 183   Basic Metabolic Panel: Recent Labs  Lab 12/17/23 0907 12/17/23 1347  NA 139 139  K 3.7 3.9  CL 98 97*  CO2 32 26  GLUCOSE 92 79  BUN 18 17  CREATININE 0.80 0.71  CALCIUM 9.3 9.6   GFR: Estimated Creatinine Clearance: 56.6 mL/min (by C-G formula based on SCr of 0.71 mg/dL). Liver Function Tests: Recent Labs  Lab 12/17/23 0907 12/17/23 1347  AST 14* 18  ALT 7 7  ALKPHOS 52 58  BILITOT 0.8 0.8  PROT 6.2* 5.9*  ALBUMIN 4.0 3.9   No results for input(s): LIPASE, AMYLASE in the last 168 hours. No results for input(s): AMMONIA in the last 168 hours. Coagulation Profile: Recent Labs  Lab 12/17/23 1347  INR 1.0   Cardiac Enzymes: No results for input(s): CKTOTAL, CKMB, CKMBINDEX, TROPONINI in the last 168 hours. BNP (last 3 results) No results for input(s): PROBNP in the last 8760 hours. HbA1C: No results for input(s): HGBA1C in the last 72 hours. CBG: No results for input(s): GLUCAP in the last 168 hours. Lipid Profile: No results for input(s): CHOL, HDL, LDLCALC, TRIG, CHOLHDL, LDLDIRECT in the last 72 hours. Thyroid  Function Tests: No results for input(s): TSH, T4TOTAL, FREET4, T3FREE, THYROIDAB in the last 72 hours. Anemia Panel: No results for input(s): VITAMINB12, FOLATE, FERRITIN, TIBC, IRON, RETICCTPCT in the last 72 hours. Sepsis Labs: No results for input(s): PROCALCITON, LATICACIDVEN in the last 168 hours.  No results found for this or any previous visit (from the past 240 hours).       Radiology Studies: DG Abd Portable 1V Result Date: 12/22/2023 CLINICAL DATA:  14941 Gastrostomy status (HCC) 14941. EXAM: PORTABLE ABDOMEN - 1 VIEW COMPARISON:  None Available. FINDINGS: The bowel gas pattern is non-obstructive. There is positive contrast in the colon. No evidence of pneumoperitoneum, within the limitations of a supine film. No acute osseous abnormalities. Multiple  dystrophic calcifications overlying the left upper abdomen corresponds to pancreatic calcifications on the prior CT scan. The soft tissues are otherwise within normal limits. Surgical changes, devices, tubes and lines: No gastrostomy tube noted. Correlate with surgical history. IMPRESSION: Nonobstructive bowel gas pattern. Electronically Signed   By: Ree Molt M.D.   On: 12/22/2023 15:55         Scheduled Meds:  magic mouthwash w/lidocaine   5 mL Oral QID   Continuous Infusions:   ceFAZolin  (ANCEF ) IV     fluconazole (DIFLUCAN) IV 100 mL/hr at 12/22/23 1706          Aleena Kirkeby Cheryle, MD [facility] 12/23/2023, 7:47 AM

## 2023-12-23 NOTE — Progress Notes (Signed)
 Nutrition Brief Note  Chart reviewed. Pt now transitioning to comfort care.  No further nutrition interventions planned at this time.    Morna Lee, MS, RD, LDN Inpatient Clinical Dietitian Contact via Secure chat

## 2023-12-23 NOTE — Progress Notes (Signed)
   This pt was referred to hospice service for our hospice facility [facility])  I have discussed with daughter hospice care and the transition to the [employer]. She reports that this is the pt's wishes and she wants to respect them. I have presented to our MD at [facility] and she hasa pproved the pt for the hospice care. We unfortunately do not have a bed at this time but will reach out in am to update on bed status. Magdalena Berber RN

## 2023-12-23 NOTE — Progress Notes (Signed)
 This nurse went to administer Morphine  IV to patient, but family refused, Stating he does not need it if he has to Sign paper work to consent for hospice. Medication not given.

## 2023-12-23 NOTE — Progress Notes (Signed)
 Chaplain met with pt Harold [name] based on a referral from chaplain team. He shared his emotions regarding this current hospitalization, along with some story sharing. Daughter [name] and son-in-law Add bedside. [name] requested prayer, which Harold [name] also welcomed.  Harold [name] communicated how much pain he is in (particularly in the mouth/tongue region), to the point where he is ready to die. It's difficult for him to focus on much else at this time. However, he also understands that the medical team is focusing on pain management.  Chaplains remain available as further needs arise.

## 2023-12-23 NOTE — Progress Notes (Signed)
 Daily Progress Note   Patient Name: Harold [name]       Date: 12/23/2023 DOB: 1934/08/24  Age: 89 y.o. MRN#: [id number] Attending Physician: Cheryle Page, MD Primary Care Physician: [name], MD Admit Date: 12/17/2023 Length of Stay: 6 days  Reason for Consultation/Follow-up: Establishing goals of care  Subjective:   CC: Patient tearful noting he does not want to suffer anymore.  Following up regarding complex medical decision making.  Subjective:  Reviewed EMR including recent documentation from IR, TOC, hospitalist, and PT.  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  At time of EMR review in the past 24 hours patient has received as needed IV morphine  2 mg x 3 doses. Discussed care with bedside RN for medical updates.  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 Individuals present for the conversation: Patient, patient's daughter Barnie, patient's son-in-law Adelard, patient's grandson Carlo over the phone, this palliative provider  Summary of the conversation:   Presented to bedside to see patient.  Patient able to introduce his son-in-law, Kalim, and daughter, Barnie, who are present at bedside.  Introduced myself as a member of the palliative medicine team.  Spent time learning about patient's medical journey up into this point.  Daughter noted concern that she has been told by  multiple providers that patient's cancer will continue to worsen and recur though concerned this has not been discussed with the patient.  Patient has also been advocating for PEG tube though unsure of patient's hopes for PEG tube moving forward knowing that he has this cancer.  With permission, spent time discussing care with patient.  Patient is hard of hearing though was able to communicate after elevating voice so patient could hear.  Spent time discussing hopes for patient's medical care moving forward.  Patient noted that he supposed to get a PEG tube though this has been stopped multiple times.  Acknowledged this.  Discussed patient's hopes for medical care moving forward with this PEG tube.  Patient able to state that he feels he has been told he has to get this PEG tube.  Patient able to state that he does not think, and he does not believe anyone in the room currently including his family, thinks that having this PEG tube placed is going to help him realistically.  Acknowledges this and further discussed if he does not feel it is going to benefit him, what is he hoping for moving forward.  Patient tearful during conversation noting that he did not know there was any other option.  With permission able to discuss pathways for medical care moving forward including aggressive medical management such as PEG tube placement and going to long-term care facility versus focusing on his comfort and getting support with hospice.  Spent time answering questions as able regarding this.  Patient appropriately tearful during discussion noting that he feels he has suffered long enough and that he does not want to suffer like this anymore.  Patient stated that he is not sure if his family will support his decision though  he would like to focus on comfort and get support with hospice.  Patient was able to state this many times in multiple different ways.  Patient stating he acknowledges he is at the end of life and that  he just wants to be comfortable.  Patient states he does not want to suffer anymore.  Patient's daughter and son-in-law present during entirety of conversation to hear this.  Spent time allowing for emotional support via active listening.    During conversation patient noted that he wanted his grandsons to also hear his decision to focus on comfort at the end of life with hospice support.  Barnie was able to call her son, Carlo, and put him on speaker phone to talk with the patient.  Provided medical updates to [facility] about conversation and pathways for medical care.  Patient again stated that he realizes he is at the end of life and wants to be comfortable.  Patient able to state to rather that he has considered this extensively and again stated he does not want to suffer anymore.  All family members acknowledging wanting to support patient's decisions regarding this.  Patient able to easily state that he then decides to be comfortable and wants to involve hospice at end-of-life.  Family supporting this.  All questions were answered at that time.  Noted would reach out to care team to coordinate patient's wishes at this point.  Was informed by RN that transport did come to pick patient up for PEG tube placement and patient again refused PEG tube placement.  Presented to bedside in afternoon for medical updates.  Patient's daughter and son-in-law present at bedside with patient.  Patient feels that the IV pain medication does help with pain because it allows him to sleep well.  Patient currently asking for more pain medicines.  Discussed trying to balance pain management and being awake.  Patient again able to discuss wanting to involve hospice.  Noted would focus on his comfort at this point.  Even discussed how his wife had hospice support at the end of life.  Spent time providing emotional support via active listening.  Daughter requesting evaluation for inpatient hospice near home such as in [place].   Noted would reach out to hospice of the [place] at her request.  All questions answered at that time.  Noted palliative medicine team will continue to follow with patient's medical journey.  Outcome of the conversations and/or documents completed:  Transition to full comfort focused care at this time.  Refer for inpatient hospice evaluation via hospice at the [facility] at daughter's request.  I spent 60 minutes providing separately identifiable ACP services with the patient and/or surrogate decision maker in a voluntary, in-person conversation discussing the patient's wishes and goals as detailed in the above note.  Tinnie Radar, DO Palliative Medicine Provider  -------------------------------------------------------------------------------------------------------------  Discussed care with hospitalist, IR, RN, quality TOC, and hospice of the [facility] liaison to coordinate care.  Objective:   Vital Signs:  BP (!) 107/58 (BP Location: Left Arm)   Pulse 86   Temp 98 F (36.7 C) (Oral)   Resp 18   Ht 5' 10 (1.778 m)   Wt 61.5 kg   SpO2 94%   BMI 19.45 kg/m   Physical Exam: General: Ill-appearing, alert, cachectic, frail HENT: Dry mucous membranes, cracked lips with bleeding present, oral mucosa noting multiple open lesions Cardiovascular: RRR Neuro: A&Ox4, following commands easily, hard of hearing Psych: appropriately answers all questions  Assessment & Plan:  Assessment: Patient is an 89 year old male with a past medical history of HFpEF, hypertension, history of DVT, non-Hodgkin's lymphoma with recurrence now transformed into 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  Hospitalization complicated by patient's variation on desire  for PEG tube placement.  Patient initially refused then agreed then refused again.  Palliative medicine team consulted to assist with complex medical decision making.  Recommendations/Plan: # Complex medical decision making/goals of care:  - Extensive conversation with patient, patient's daughter, and patient's son-in-law at bedside as detailed above in HPI.  Spent time discussing possible pathways for medical care moving forward.  Patient able to voice that he thought proceeding with PEG tube was his only option since that was what the medical team had discussed.  Able to discuss possible pathways for medical care moving forward including aggressive medical management with PEG tube placement versus focusing on patient's comfort at end-of-life.  Patient able to state that he has suffered enough and he wants to be comfortable at the end of life.  Patient's daughter, son-in-law, and grandson able to be involved in conversation in person and over the phone.  Pursuing inpatient hospice referral at this time due to patient's severe symptom management and inability to maintain any oral intake at end-of-life.  TOC and hospice of [place] liaison involved in patient's care.  Palliative medicine team continuing to engage in conversations as able and appropriate moving forward.  -At this time we will discontinue interventions that are no longer focused on comfort such as IV fluids, imaging, or lab work.  Will instead focus on symptom management of pain, dyspnea, and agitation in the setting of end-of-life care.  -  Code Status: Do not attempt resuscitation (DNR) - Comfort care  # Symptom management: Patient is receiving these palliative interventions for symptom management with an intent to improve quality of life.   - Pain, in setting of mucositis from recent radiation with underlying B-cell lymphoma of head and neck   - Change IV morphine  to 2 mg every 30 minute as needed.  If needing frequent doses, consider  continuous infusion for management.   - Start lidocaine  topical external solution as needed                 -Anxiety/agitation, in the setting of end-of-life care                               -Start IV Haldol 1 mg every 4 hours as needed. Continue to adjust based on patient's symptom burden.                   -Secretions, in the setting of end-of-life care                               -Start IV glycopyrrolate 0.2 mg every 4 hours as needed.  # Psychosocial Support:  - Daughter, son-in-law, grandsons  # Discharge Planning: To Be Determined - Seeking inpatient hospice referral and evaluation.  Discussed with: Patient, patient's daughter, patient's son-in-law, patient's grandson, Charity fundraiser, hospitalist, hospice of the [facility], TOC, IR  Thank you for allowing the palliative care team  to participate in the care Lynwood T Render.  Tinnie Radar, DO Palliative Care Provider PMT # [phone]  If patient remains symptomatic despite maximum doses, please call PMT at [phone] between 0700 and 1900. Outside of these hours, please call attending, as PMT does not have night coverage.  Billing based on MDM: High  Problems Addressed: One or more chronic illnesses with severe exacerbation, progression, or side effects of treatment.  Risks: Parenteral controlled substances

## 2023-12-24 ENCOUNTER — Ambulatory Visit

## 2023-12-24 ENCOUNTER — Encounter: Payer: Self-pay | Admitting: Hematology

## 2023-12-24 DIAGNOSIS — R627 Adult failure to thrive: Secondary | ICD-10-CM | POA: Diagnosis not present

## 2023-12-24 DIAGNOSIS — K1233 Oral mucositis (ulcerative) due to radiation: Secondary | ICD-10-CM | POA: Diagnosis not present

## 2023-12-24 DIAGNOSIS — Z7189 Other specified counseling: Secondary | ICD-10-CM

## 2023-12-24 DIAGNOSIS — I5032 Chronic diastolic (congestive) heart failure: Secondary | ICD-10-CM | POA: Diagnosis not present

## 2023-12-24 DIAGNOSIS — Y842 Radiological procedure and radiotherapy as the cause of abnormal reaction of the patient, or of later complication, without mention of misadventure at the time of the procedure: Secondary | ICD-10-CM | POA: Diagnosis not present

## 2023-12-24 MED ORDER — HALOPERIDOL LACTATE 5 MG/ML IJ SOLN: 1.0000 mg | INTRAMUSCULAR | Status: DC | PRN

## 2023-12-24 MED ORDER — GLYCOPYRROLATE 0.2 MG/ML IJ SOLN: 0.2000 mg | INTRAMUSCULAR | Status: DC | PRN

## 2023-12-24 MED ORDER — MAGIC MOUTHWASH W/LIDOCAINE: 5.0000 mL | Freq: Four times a day (QID) | ORAL | Status: DC

## 2023-12-24 NOTE — Radiation Completion Notes (Addendum)
  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   Intent: Palliative     ==========DELIVERED PLANS==========  First Treatment Date: 2023-12-01 Last Treatment Date: 2023-12-17   Plan Name: HN Site: Face Technique: 3D Mode: Photon Dose Per Fraction: 2.5 Gy Prescribed Dose (Delivered / Prescribed): 20 Gy / 20 Gy Prescribed Fxs (Delivered / Prescribed): 8 / 8   Plan Name: HN_NewRx Site: Face Technique: 3D Mode: Photon Dose Per Fraction: 2.5 Gy Prescribed Dose (Delivered / Prescribed): 10 Gy / 15 Gy Prescribed Fxs (Delivered / Prescribed): 4 / 6     ==========ON TREATMENT VISIT DATES========== 2023-12-05, 2023-12-11    See weekly On Treatment Notes in Epic for details in the Media tab (listed as Progress notes on the On Treatment Visit Dates listed above).The patient developed mucositis and was admitted with dehydration and poor nutrition. He elected to discontinue radiation prior to completing.  The patient will receive a call in about one month from the radiation oncology department. He will continue follow up with Dr. [name] as well.      [name] KYM Husband, PAC

## 2023-12-24 NOTE — TOC Transition Note (Signed)
 Transition of Care [facility]) - Discharge Note   Patient Details  Name: Harold [name] MRN: [id number] Date of Birth: 06-Apr-1934  [facility] CM/SW Contact:  Sheri ONEIDA Sharps, LCSW Phone Number: 12/24/2023, 3:55 PM   Clinical Narrative:    Pt ready to dc to [facility]. DC packet left at nurses station. Call report to [phone]. PTAR called, no further ICM needs   Final next level of care: Hospice Medical Facility Barriers to Discharge: Barriers Resolved   Patient Goals and CMS Choice Patient states their goals for this hospitalization and ongoing recovery are:: go to inpatient hospice CMS Medicare.gov Compare Post Acute Care list provided to:: Patient Choice offered to / list presented to : NA [facility] ownership interest in [facility].provided to:: Patient    Discharge Placement                Patient to be transferred to facility by: PTAR   Patient and family notified of of transfer: 12/24/23  Discharge Plan and Services Additional resources added to the After Visit Summary for   In-house Referral: NA Discharge Planning Services: CM Consult Post Acute Care Choice: Residential Hospice Bed, Nursing Home, Hospice          DME Arranged: N/A DME Agency: NA       HH Arranged: NA HH Agency: NA        Social Drivers of Health (SDOH) Interventions SDOH Screenings   Food Insecurity: No Food Insecurity (12/17/2023)  Housing: Low Risk  (12/17/2023)  Transportation Needs: No Transportation Needs (12/17/2023)  Utilities: Not At Risk (12/17/2023)  Depression (PHQ2-9): Low Risk  (10/13/2023)  Financial Resource Strain: Low Risk  (11/17/2022)   Received from [facility]  Physical Activity: Inactive (11/17/2022)   Received from [facility]  Social Connections: Moderately Integrated (12/17/2023)  Stress: Stress Concern Present (11/17/2022)   Received from [facility]  Tobacco Use: Low Risk  (12/17/2023)  Health Literacy: Low Risk   (11/17/2022)   Received from [facility]     Readmission Risk Interventions    12/24/2023    3:54 PM 12/19/2023    3:12 PM 10/20/2023    2:00 PM  Readmission Risk Prevention Plan  Transportation Screening Complete Complete Complete  PCP or Specialist Appt within 5-7 Days Complete Complete   PCP or Specialist Appt within 3-5 Days   Complete  Home Care Screening Complete Complete   Medication Review (RN CM) Complete Complete   HRI or Home Care Consult   Complete  Social Work Consult for Recovery Care Planning/Counseling   Complete  Palliative Care Screening   Not Applicable  Medication Review Oceanographer)   Complete

## 2023-12-24 NOTE — Progress Notes (Signed)
 Daily Progress Note   Patient Name: Harold [name]       Date: 12/24/2023 DOB: 1934/05/13  Age: 89 y.o. MRN#: [id number] Attending Physician: Davia Nydia POUR, MD Primary Care Physician: [name], MD Admit Date: 12/17/2023 Length of Stay: 7 days  Reason for Consultation/Follow-up: Establishing goals of care  Subjective:   CC: Patient sleeping comfortably in bed.  Following up regarding complex medical decision making.  Subjective:  Reviewed EMR noting recent documentation from hospice liaison.  Patient has been accepted to inpatient hospice though bed was not available yesterday. At time of EMR review in past 24 hours patient has required as needed IV morphine  2 mg x 4 doses and as needed IV Haldol 1 mg x 1 dose.  Presented to bedside to see patient.  Patient was sleeping comfortably so did not awaken.  Patient's daughter and son in law were present at bedside.  Daughter noted that they have been coordinating with hospice at the [facility] and plan is for him to go to the inpatient hospice facility in [employer] after 2 PM today.  She noted patient has been informed this and was greatly appreciative of this.  Daughter voiced appreciation for discussions and care coordination.  Spent time answering questions and providing emotional support via active listening.  Noted palliative medicine team will be available if needed.  Discussed care with hospitalist, RN, and TOC to coordinate care and planning for discharge today.  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  Hospitalization complicated by patient's variation on desire for PEG tube placement.  Patient initially refused then agreed then refused again.  Palliative medicine team consulted to assist with complex medical decision making.  Recommendations/Plan: # Complex medical decision making/goals of care:  - Had extensive conversations with patient, patient's daughter, and patient's son-in-law 12/23/2023.  After discussions patient was transition to comfort focused care at that time as per his wishes.  Planning for patient to go to inpatient hospice facility in [place] today.  TOC and hospice liaison coordinating care.  Palliative medicine team continuing to follow along with patient's medical journey.  - Already discontinued interventions that are no longer focused on comfort such as IV fluids, imaging, or lab work.  Focusing on symptom management of pain, dyspnea, and agitation in the setting of end-of-life care.  -  Code Status: Do not attempt resuscitation (DNR) - Comfort care  # Symptom management: Patient is receiving these palliative interventions for symptom management with an intent to improve quality of life.   - Pain, in setting of mucositis from recent radiation with underlying B-cell lymphoma of head and neck   - Continue IV morphine  to 2  mg every 30 minute as needed.  If needing frequent doses, consider continuous infusion for management.   - Continue lidocaine  topical external solution as needed                 -Anxiety/agitation, in the setting of end-of-life care                               - Continue IV Haldol 1 mg every  4 hours as needed. Continue to adjust based on patient's symptom burden.                   -Secretions, in the setting of end-of-life care                               - Continue IV glycopyrrolate 0.2 mg every 4 hours as needed.  # Psychosocial Support:  - Daughter, son-in-law, grandsons  # Discharge Planning: Hospice facility  Discussed with: Patient's daughter, patient's son-in-law, RN, hospitalist, TOC  Thank you for allowing the palliative care team to participate in the care Harold [name].  Tinnie Radar, DO Palliative Care Provider PMT # [phone]  If patient remains symptomatic despite maximum doses, please call PMT at [phone] between 0700 and 1900. Outside of these hours, please call attending, as PMT does not have night coverage.  Personally spent 35 minutes in patient care including extensive chart review (labs, imaging, progress/consult notes, vital signs), medically appropraite exam, discussed with treatment team, education to patient, family, and staff, documenting clinical information, medication review and management, coordination of care, and available advanced directive documents.

## 2023-12-24 NOTE — Progress Notes (Signed)
 Report called to Rexene, Charity fundraiser at [facility].

## 2023-12-24 NOTE — Progress Notes (Signed)
 Chaplain responded to unit page for support prior to d/c. Daughter and son-in-law bedside.  Pt Harold [name] expressed his strong desire to be transported to hospice care, which he understood would occur sometime after 2:00pm. He was in a great deal of pain, but had just recently received medication, though he felt it wasn't working at the time. He asked me to pray that he would be picked up by hospice soon, which I did. Emotional support also provided to the family.  After speaking with the medical team, they informed me that the call for transport had been made and that we are waiting for them to arrive. I shared this news with the family who expressed their understanding.  Upon leaving, Harold [name] had fallen comfortably asleep.  Chaplains remain available as further needs arise.

## 2023-12-24 NOTE — Plan of Care (Signed)
  Problem: Safety: Goal: Ability to remain free from injury will improve Outcome: Progressing   Problem: Skin Integrity: Goal: Risk for impaired skin integrity will decrease Outcome: Progressing   Problem: Coping: Goal: Ability to identify and develop effective coping behavior will improve Outcome: Progressing

## 2023-12-24 NOTE — Discharge Summary (Signed)
 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   HYPERTENSION, BENIGN ESSENTIAL   Chronic diastolic CHF (congestive heart failure) (HCC)   History of DVT (deep vein thrombosis)   Diffuse large B-cell lymphoma of lymph nodes of multiple regions (HCC)   DNR (do not resuscitate)/DNI(Do Not Intubate)   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  IR was consulted.  Palliative care also consulted.    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   - Patient was placed on IV Diflucan.  Oncology, palliative medicine, IR was consulted.   - Initially agreed to G-tube placement to optimize his nutrition and hydration however subsequently declined.  - He requested for no more  treatments and to go on hospice.  Patient's daughter and her spouse at the bedside.    History of DVT - Comfort care, hospice   Chronic diastolic CHF Bilateral lower extremity chronic edema, possible lymphedema - Currently compensated.  Lasix  on hold, comfort care   Hypertension - Blood pressure stable and intermittently on the lower side.   Anemia of chronic disease - From chronic illnesses and malignancy.           Pain control - [place]  Controlled Substance Reporting System database was reviewed. and patient was instructed, not to drive, operate heavy machinery, perform activities at heights, swimming or participation in water activities or provide baby-sitting services while on Pain, Sleep and Anxiety Medications; until their outpatient Physician has advised to do so again. Also recommended to not to take more than prescribed Pain, Sleep and Anxiety Medications.  Consultants: Oncology, palliative medicine, IR Procedures performed:   Disposition: Residential hospice Diet recommendation: Comfort diet Discharge Diet Orders (From admission, onward)     Start     Ordered   12/24/23 0000  Diet general        12/24/23 1021            DISCHARGE MEDICATION: Allergies as of 12/24/2023   No Known Allergies      Medication List     STOP taking these medications    acyclovir  400 MG tablet Commonly known as: ZOVIRAX    allopurinol  100 MG tablet Commonly known as: ZYLOPRIM    amoxicillin -clavulanate 875-125 MG tablet Commonly known as: AUGMENTIN    aspirin  EC  81 MG tablet   atenolol  25 MG tablet Commonly known as: TENORMIN    cyanocobalamin  1000 MCG tablet Commonly known as: VITAMIN B12   Eliquis  5 MG Tabs tablet Generic drug: apixaban    finasteride  5 MG tablet Commonly known as: PROSCAR    furosemide  20 MG tablet Commonly known as: LASIX    gabapentin  100 MG capsule Commonly known as: NEURONTIN    lidocaine -prilocaine  cream Commonly known as:  EMLA    nitroGLYCERIN  0.4 MG SL tablet Commonly known as: Nitrostat    omeprazole 40 MG capsule Commonly known as: PRILOSEC   ondansetron  8 MG tablet Commonly known as: Zofran    oxyCODONE  5 MG immediate release tablet Commonly known as: Oxy IR/ROXICODONE    pravastatin  40 MG tablet Commonly known as: PRAVACHOL    Vitamin D3 50 MCG (2000 UT) Tabs       TAKE these medications    acetaminophen  325 MG tablet Commonly known as: TYLENOL  Take 325 mg by mouth daily as needed for mild pain (pain score 1-3) or moderate pain (pain score 4-6).   brimonidine  0.2 % ophthalmic solution Commonly known as: ALPHAGAN  Place 1 drop into the right eye daily.   docusate sodium  100 MG capsule Commonly known as: COLACE Take 1 capsule (100 mg total) by mouth 2 (two) times daily as needed for mild constipation.   glycopyrrolate 0.2 MG/ML injection Commonly known as: ROBINUL Inject 1 mL (0.2 mg total) into the vein every 4 (four) hours as needed (excessive secretions).   haloperidol lactate 5 MG/ML injection Commonly known as: HALDOL Inject 0.2 mLs (1 mg total) into the vein every 4 (four) hours as needed (or delirium).   magic mouthwash w/lidocaine  Soln Take 5 mLs by mouth 4 (four) times daily. Suspension contains equal amounts of Maalox Extra Strength, nystatin, diphenhydramine  and lidocaine .   prochlorperazine  10 MG tablet Commonly known as: COMPAZINE  Take 1 tablet (10 mg total) by mouth every 6 (six) hours as needed for nausea or vomiting.        Follow-up Information     [name], MD Follow up.   Specialty: Family Medicine Why: as needed Contact information: [address] [employer] [address] [place] [address] [phone]                Discharge Exam: [name]   12/17/23 2036  Weight: 61.5 kg   S: Sitting upright, daughter and son-in-law at the bedside.  No acute issues.  BP 129/70 (BP Location: Left Arm)   Pulse 99   Temp 97.7  F (36.5 C) (Oral)   Resp 16   Ht 5' 10 (1.778 m)   Wt 61.5 kg   SpO2 96%   BMI 19.45 kg/m   Physical Exam General: Alert and oriented, chronically ill and deconditioned.  Large right facial mass Cardiovascular: S1 S2 clear, RRR.  Respiratory: Decreased breath sounds at the bases Gastrointestinal: Soft, nontender, nondistended, NBS Ext: no pedal edema bilaterally Psych: Normal affect    Condition at discharge: poor  The results of significant diagnostics from this hospitalization (including imaging, microbiology, ancillary and laboratory) are listed below for reference.   Imaging Studies: DG Abd Portable 1V Result Date: 12/22/2023 CLINICAL DATA:  14941 Gastrostomy status (HCC) 14941. EXAM: PORTABLE ABDOMEN - 1 VIEW COMPARISON:  None Available. FINDINGS: The bowel gas pattern is non-obstructive. There is positive contrast in the colon. No evidence of pneumoperitoneum, within the limitations of a supine film. No acute osseous abnormalities. Multiple dystrophic calcifications overlying the left upper abdomen corresponds to  pancreatic calcifications on the prior CT scan. The soft tissues are otherwise within normal limits. Surgical changes, devices, tubes and lines: No gastrostomy tube noted. Correlate with surgical history. IMPRESSION: Nonobstructive bowel gas pattern. Electronically Signed   By: Ree Molt M.D.   On: 12/22/2023 15:55   CT ABDOMEN WO CONTRAST Result Date: 12/18/2023 CLINICAL DATA:  History of lymphoma and external beam radiation. Patient has developed mucositis and been losing weight. Patient is unable eat and drink. Evaluate anatomy for gastrostomy tube placement. EXAM: CT ABDOMEN WITHOUT CONTRAST TECHNIQUE: Multidetector CT imaging of the abdomen was performed following the standard protocol without IV contrast. RADIATION DOSE REDUCTION: This exam was performed according to the departmental dose-optimization program which includes automated exposure control, adjustment  of the mA and/or kV according to patient size and/or use of iterative reconstruction technique. COMPARISON:  PET-CT 08/25/2023 FINDINGS: Lower chest: Lung bases are clear. Hepatobiliary: Normal appearance of the liver and gallbladder. Pancreas: Diffuse pancreatic calcifications compatible with prior inflammation. Spleen: Normal in size without focal abnormality. Adrenals/Urinary Tract: Normal adrenal glands. Normal appearance of both kidneys without hydronephrosis or stones. Stomach/Bowel: Normal appearance of the stomach. The stomach is located high in the left upper quadrant. Limited percutaneous window for gastrostomy tube placement due to the transverse colon and splenic flexure of the colon interposed between the abdominal wall and the stomach. No evidence for bowel dilatation or obstruction. Vascular/Lymphatic: Atherosclerotic disease in the abdominal aorta without aneurysm. Decreased size of the retroperitoneal lymph nodes in the periaortic stations. Index lymph node below the left renal vessels measures 0.9 cm in the short axis on image 27/2 and measured 1.5 cm on 08/25/2023. Other: Negative for ascites. Musculoskeletal: Multilevel degenerative disc disease in the visualized spine. No acute bone abnormality. IMPRESSION: 1. Stomach is located high in the left upper quadrant of the abdomen. Limited percutaneous window for gastrostomy tube placement due to interposed colon. Recommend visualization of the colon for percutaneous gastrostomy tube placement. 2. Decreased retroperitoneal lymphadenopathy since 08/25/2023. Electronically Signed   By: Juliene Balder M.D.   On: 12/18/2023 12:01    Microbiology: Results for orders placed or performed during the hospital encounter of 10/18/23  Blood culture (routine x 2)     Status: Abnormal   Collection Time: 10/18/23  2:05 AM   Specimen: BLOOD RIGHT ARM  Result Value Ref Range Status   Specimen Description   Final    BLOOD RIGHT ARM Performed at [facility], [address] [place], [place] [address]    Special Requests   Final    BOTTLES DRAWN AEROBIC AND ANAEROBIC Blood Culture results may not be optimal due to an inadequate volume of blood received in culture bottles Performed at [facility], [address] [place], [place] [address]    Culture  Setup Time   Final    GRAM POSITIVE COCCI IN CHAINS AEROBIC BOTTLE ONLY CRITICAL RESULT CALLED TO, READ BACK BY AND VERIFIED WITH: MAYA DAMIEN SQUIBB [id number] FCP Performed at [facility], [address] [place], [place] [address]    Culture GROUP B STREP(S.AGALACTIAE)ISOLATED (A)  Final   Report Status 10/21/2023 FINAL  Final   Organism ID, Bacteria GROUP B STREP(S.AGALACTIAE)ISOLATED  Final      Susceptibility   Group b strep(s.agalactiae)isolated - MIC*    CLINDAMYCIN >=1 RESISTANT Resistant     AMPICILLIN <=0.25 SENSITIVE Sensitive     ERYTHROMYCIN >=8 RESISTANT Resistant     VANCOMYCIN  0.5 SENSITIVE Sensitive  CEFTRIAXONE  <=0.12 SENSITIVE Sensitive     LEVOFLOXACIN 4 INTERMEDIATE Intermediate     PENICILLIN  <=0.06 SENSITIVE Sensitive     * GROUP B STREP(S.AGALACTIAE)ISOLATED  Blood culture (routine x 2)     Status: None   Collection Time: 10/18/23  2:05 AM   Specimen: BLOOD LEFT FOREARM  Result Value Ref Range Status   Specimen Description   Final    BLOOD LEFT FOREARM Performed at [facility], [address], [place], [place] [address]    Special Requests   Final    BOTTLES DRAWN AEROBIC AND ANAEROBIC Blood Culture results may not be optimal due to an inadequate volume of blood received in culture bottles Performed at [facility], [address], [place], [place] [address]    Culture   Final    NO GROWTH 5 DAYS Performed at [facility], [address], [place], [place] [address]    Report Status 10/23/2023 FINAL  Final  Blood Culture ID Panel (Reflexed)     Status: Abnormal   Collection Time: 10/18/23   2:05 AM  Result Value Ref Range Status   Enterococcus faecalis NOT DETECTED NOT DETECTED Final   Enterococcus Faecium NOT DETECTED NOT DETECTED Final   Listeria monocytogenes NOT DETECTED NOT DETECTED Final   Staphylococcus species NOT DETECTED NOT DETECTED Final   Staphylococcus aureus (BCID) NOT DETECTED NOT DETECTED Final   Staphylococcus epidermidis NOT DETECTED NOT DETECTED Final   Staphylococcus lugdunensis NOT DETECTED NOT DETECTED Final   Streptococcus species DETECTED (A) NOT DETECTED Final    Comment: CRITICAL RESULT CALLED TO, READ BACK BY AND VERIFIED WITH: PHARMD LEANNE P [id number] FCP    Streptococcus agalactiae DETECTED (A) NOT DETECTED Final    Comment: CRITICAL RESULT CALLED TO, READ BACK BY AND VERIFIED WITH: PHARMD LEANNE P [id number] FCP    Streptococcus pneumoniae NOT DETECTED NOT DETECTED Final   Streptococcus pyogenes NOT DETECTED NOT DETECTED Final   A.calcoaceticus-baumannii NOT DETECTED NOT DETECTED Final   Bacteroides fragilis NOT DETECTED NOT DETECTED Final   Enterobacterales NOT DETECTED NOT DETECTED Final   Enterobacter cloacae complex NOT DETECTED NOT DETECTED Final   Escherichia coli NOT DETECTED NOT DETECTED Final   Klebsiella aerogenes NOT DETECTED NOT DETECTED Final   Klebsiella oxytoca NOT DETECTED NOT DETECTED Final   Klebsiella pneumoniae NOT DETECTED NOT DETECTED Final   Proteus species NOT DETECTED NOT DETECTED Final   Salmonella species NOT DETECTED NOT DETECTED Final   Serratia marcescens NOT DETECTED NOT DETECTED Final   Haemophilus influenzae NOT DETECTED NOT DETECTED Final   Neisseria meningitidis NOT DETECTED NOT DETECTED Final   Pseudomonas aeruginosa NOT DETECTED NOT DETECTED Final   Stenotrophomonas maltophilia NOT DETECTED NOT DETECTED Final   Candida albicans NOT DETECTED NOT DETECTED Final   Candida auris NOT DETECTED NOT DETECTED Final   Candida glabrata NOT DETECTED NOT DETECTED Final   Candida krusei NOT DETECTED NOT  DETECTED Final   Candida parapsilosis NOT DETECTED NOT DETECTED Final   Candida tropicalis NOT DETECTED NOT DETECTED Final   Cryptococcus neoformans/gattii NOT DETECTED NOT DETECTED Final    Comment: Performed at [facility], [address], [place], [place] [address]  Resp panel by RT-PCR (RSV, Flu A&B, Covid) Anterior Nasal Swab     Status: None   Collection Time: 10/18/23  2:26 AM   Specimen: Anterior Nasal Swab  Result Value Ref Range Status   SARS Coronavirus 2 by RT PCR NEGATIVE  NEGATIVE Final    Comment: (NOTE) SARS-CoV-2 target nucleic acids are NOT DETECTED.  The SARS-CoV-2 RNA is generally detectable in upper respiratory specimens during the acute phase of infection. The lowest concentration of SARS-CoV-2 viral copies this assay can detect is 138 copies/mL. A negative result does not preclude SARS-Cov-2 infection and should not be used as the sole basis for treatment or other patient management decisions. A negative result may occur with  improper specimen collection/handling, submission of specimen other than nasopharyngeal swab, presence of viral mutation(s) within the areas targeted by this assay, and inadequate number of viral copies(<138 copies/mL). A negative result must be combined with clinical observations, patient history, and epidemiological information. The expected result is Negative.  Fact Sheet for Patients:  BloggerCourse.com  Fact Sheet for Healthcare Providers:  SeriousBroker.it  This test is no t yet approved or cleared by the [place]  FDA and  has been authorized for detection and/or diagnosis of SARS-CoV-2 by FDA under an Emergency Use Authorization (EUA). This EUA will remain  in effect (meaning this test can be used) for the duration of the COVID-19 declaration under Section 564(b)(1) of the Act, 21 U.S.C.section 360bbb-3(b)(1), unless the authorization is terminated  or revoked sooner.        Influenza A by PCR NEGATIVE NEGATIVE Final   Influenza B by PCR NEGATIVE NEGATIVE Final    Comment: (NOTE) The Xpert Xpress SARS-CoV-2/FLU/RSV plus assay is intended as an aid in the diagnosis of influenza from Nasopharyngeal swab specimens and should not be used as a sole basis for treatment. Nasal washings and aspirates are unacceptable for Xpert Xpress SARS-CoV-2/FLU/RSV testing.  Fact Sheet for Patients: BloggerCourse.com  Fact Sheet for Healthcare Providers: SeriousBroker.it  This test is not yet approved or cleared by the [place]  FDA and has been authorized for detection and/or diagnosis of SARS-CoV-2 by FDA under an Emergency Use Authorization (EUA). This EUA will remain in effect (meaning this test can be used) for the duration of the COVID-19 declaration under Section 564(b)(1) of the Act, 21 U.S.C. section 360bbb-3(b)(1), unless the authorization is terminated or revoked.     Resp Syncytial Virus by PCR NEGATIVE NEGATIVE Final    Comment: (NOTE) Fact Sheet for Patients: BloggerCourse.com  Fact Sheet for Healthcare Providers: SeriousBroker.it  This test is not yet approved or cleared by the [place]  FDA and has been authorized for detection and/or diagnosis of SARS-CoV-2 by FDA under an Emergency Use Authorization (EUA). This EUA will remain in effect (meaning this test can be used) for the duration of the COVID-19 declaration under Section 564(b)(1) of the Act, 21 U.S.C. section 360bbb-3(b)(1), unless the authorization is terminated or revoked.  Performed at [facility], [address], [place], [place] [address]   Urine Culture     Status: None   Collection Time: 10/18/23  6:16 AM   Specimen: Urine, Clean Catch  Result Value Ref Range Status   Specimen Description   Final    URINE, CLEAN CATCH Performed at [facility], [address], [place], [place] [address]    Special Requests   Final    NONE Performed at [facility], [address], [place], [place] [address]    Culture   Final    NO GROWTH Performed at [facility], [address], [place], [place] [address]    Report Status 10/19/2023 FINAL  Final  MRSA Next Gen by PCR, Nasal     Status:  None   Collection Time: 10/18/23  9:19 AM   Specimen: Nasal Mucosa; Nasal Swab  Result Value Ref Range Status   MRSA by PCR Next Gen NOT DETECTED NOT DETECTED Final    Comment: (NOTE) The GeneXpert MRSA Assay (FDA approved for NASAL specimens only), is one component of a comprehensive MRSA colonization surveillance program. It is not intended to diagnose MRSA infection nor to guide or monitor treatment for MRSA infections. Test performance is not FDA approved in patients less than 25 years old. Performed at [facility], [address] [place], [place] [address]   Culture, blood (Routine X 2) w Reflex to ID Panel     Status: None   Collection Time: 10/19/23  2:42 AM   Specimen: BLOOD LEFT HAND  Result Value Ref Range Status   Specimen Description BLOOD LEFT HAND  Final   Special Requests   Final    BOTTLES DRAWN AEROBIC ONLY Blood Culture results may not be optimal due to an inadequate volume of blood received in culture bottles   Culture   Final    NO GROWTH 5 DAYS Performed at [facility], [address] [place], [place] [address]    Report Status 10/24/2023 FINAL  Final  Culture, blood (Routine X 2) w Reflex to ID Panel     Status: None   Collection Time: 10/19/23  2:42 AM   Specimen: BLOOD LEFT ARM  Result Value Ref Range Status   Specimen Description BLOOD LEFT ARM  Final   Special Requests   Final    BOTTLES DRAWN AEROBIC ONLY Blood Culture results may not be optimal due to an inadequate volume of blood received in culture bottles   Culture   Final    NO GROWTH 5  DAYS Performed at [facility], [address], [place], [place] [address]    Report Status 10/24/2023 FINAL  Final    Labs: CBC: Recent Labs  Lab 12/17/23 1347 12/22/23 0630  WBC 8.5 7.0  NEUTROABS 6.6 5.5  HGB 10.7* 9.5*  HCT 34.9* 31.7*  MCV 88.8 89.8  PLT 251 183   Basic Metabolic Panel: Recent Labs  Lab 12/17/23 1347  NA 139  K 3.9  CL 97*  CO2 26  GLUCOSE 79  BUN 17  CREATININE 0.71  CALCIUM 9.6   Liver Function Tests: Recent Labs  Lab 12/17/23 1347  AST 18  ALT 7  ALKPHOS 58  BILITOT 0.8  PROT 5.9*  ALBUMIN 3.9   CBG: No results for input(s): GLUCAP in the last 168 hours.  Discharge time spent: greater than 30 minutes.  Signed: Nydia Distance, MD [facility] 12/24/2023

## 2023-12-24 NOTE — Progress Notes (Signed)
 Attempted to follow up with Harold [name], but he was resting.  His daughter stated that she was doing okay, but just wanted to honor his wishes and wanted him to be able to rest right now.  She shared that the plan is to transfer him around 2:00 to inpatient hospice.  If needs arise before that time, please page us  at [phone].

## 2024-01-17 DEATH — deceased

## 2024-03-04 ENCOUNTER — Ambulatory Visit: Admitting: Cardiovascular Disease

## 2024-03-05 ENCOUNTER — Ambulatory Visit: Admitting: Cardiovascular Disease
